# Patient Record
Sex: Female | Born: 1950
Health system: Southern US, Community
[De-identification: ages and names within clinical notes are randomized; demographics above are authoritative.]

## PROBLEM LIST (undated history)

## (undated) DIAGNOSIS — G901 Familial dysautonomia [Riley-Day]: Secondary | ICD-10-CM

## (undated) DIAGNOSIS — Q796 Ehlers-Danlos syndrome, unspecified: Secondary | ICD-10-CM

## (undated) DIAGNOSIS — E039 Hypothyroidism, unspecified: Secondary | ICD-10-CM

## (undated) DIAGNOSIS — M797 Fibromyalgia: Secondary | ICD-10-CM

## (undated) DIAGNOSIS — Z950 Presence of cardiac pacemaker: Secondary | ICD-10-CM

## (undated) DIAGNOSIS — I639 Cerebral infarction, unspecified: Secondary | ICD-10-CM

## (undated) DIAGNOSIS — R234 Changes in skin texture: Secondary | ICD-10-CM

## (undated) DIAGNOSIS — I471 Supraventricular tachycardia, unspecified: Secondary | ICD-10-CM

## (undated) DIAGNOSIS — Z8679 Personal history of other diseases of the circulatory system: Secondary | ICD-10-CM

## (undated) DIAGNOSIS — E7800 Pure hypercholesterolemia, unspecified: Secondary | ICD-10-CM

## (undated) DIAGNOSIS — I4719 Other supraventricular tachycardia: Secondary | ICD-10-CM

## (undated) DIAGNOSIS — R011 Cardiac murmur, unspecified: Secondary | ICD-10-CM

## (undated) DIAGNOSIS — I341 Nonrheumatic mitral (valve) prolapse: Secondary | ICD-10-CM

## (undated) DIAGNOSIS — R51 Headache: Secondary | ICD-10-CM

## (undated) DIAGNOSIS — F329 Major depressive disorder, single episode, unspecified: Secondary | ICD-10-CM

## (undated) DIAGNOSIS — C4491 Basal cell carcinoma of skin, unspecified: Secondary | ICD-10-CM

## (undated) DIAGNOSIS — I679 Cerebrovascular disease, unspecified: Secondary | ICD-10-CM

## (undated) DIAGNOSIS — F32A Depression, unspecified: Secondary | ICD-10-CM

## (undated) DIAGNOSIS — Z87898 Personal history of other specified conditions: Secondary | ICD-10-CM

## (undated) DIAGNOSIS — R269 Unspecified abnormalities of gait and mobility: Secondary | ICD-10-CM

## (undated) DIAGNOSIS — G43109 Migraine with aura, not intractable, without status migrainosus: Secondary | ICD-10-CM

## (undated) DIAGNOSIS — Z7901 Long term (current) use of anticoagulants: Secondary | ICD-10-CM

## (undated) DIAGNOSIS — I4891 Unspecified atrial fibrillation: Secondary | ICD-10-CM

## (undated) DIAGNOSIS — D689 Coagulation defect, unspecified: Secondary | ICD-10-CM

## (undated) DIAGNOSIS — Z8673 Personal history of transient ischemic attack (TIA), and cerebral infarction without residual deficits: Secondary | ICD-10-CM

## (undated) DIAGNOSIS — F419 Anxiety disorder, unspecified: Secondary | ICD-10-CM

## (undated) HISTORY — PX: PACEMAKER INSERTION: SHX728

## (undated) HISTORY — DX: Unspecified atrial fibrillation: I48.91

## (undated) HISTORY — DX: Coagulation defect, unspecified: D68.9

## (undated) HISTORY — DX: Cardiac murmur, unspecified: R01.1

## (undated) HISTORY — DX: Headache: R51

## (undated) HISTORY — DX: Anxiety disorder, unspecified: F41.9

## (undated) HISTORY — DX: Unspecified abnormalities of gait and mobility: R26.9

## (undated) HISTORY — DX: Long term (current) use of anticoagulants: Z79.01

## (undated) HISTORY — DX: Nonrheumatic mitral (valve) prolapse: I34.1

## (undated) HISTORY — DX: Pure hypercholesterolemia, unspecified: E78.00

## (undated) HISTORY — DX: Other supraventricular tachycardia: I47.19

## (undated) HISTORY — DX: Major depressive disorder, single episode, unspecified: F32.9

## (undated) HISTORY — DX: Depression, unspecified: F32.A

## (undated) HISTORY — PX: OTHER SURGICAL HISTORY: SHX169

## (undated) HISTORY — DX: Basal cell carcinoma of skin, unspecified: C44.91

## (undated) HISTORY — DX: Personal history of transient ischemic attack (TIA), and cerebral infarction without residual deficits: Z86.73

## (undated) HISTORY — DX: Personal history of other specified conditions: Z87.898

## (undated) HISTORY — DX: Migraine with aura, not intractable, without status migrainosus: G43.109

## (undated) HISTORY — DX: Supraventricular tachycardia: I47.1

## (undated) HISTORY — DX: Familial dysautonomia (riley-day): G90.1

## (undated) HISTORY — DX: Presence of cardiac pacemaker: Z95.0

## (undated) HISTORY — PX: ABLATION: SHX5711

## (undated) HISTORY — DX: Cerebral infarction, unspecified: I63.9

## (undated) HISTORY — DX: Fibromyalgia: M79.7

## (undated) HISTORY — PX: LEAD REVISION: SHX5945

## (undated) HISTORY — PX: INSERT / REPLACE / REMOVE PACEMAKER: SUR710

## (undated) HISTORY — DX: Ehlers-Danlos syndrome, unspecified: Q79.60

## (undated) HISTORY — PX: CAROTID ENDARTERECTOMY: SUR193

## (undated) HISTORY — DX: Changes in skin texture: R23.4

## (undated) HISTORY — DX: Personal history of other diseases of the circulatory system: Z86.79

## (undated) HISTORY — DX: Cerebrovascular disease, unspecified: I67.9

## (undated) HISTORY — DX: Hypothyroidism, unspecified: E03.9

## (undated) HISTORY — DX: Supraventricular tachycardia, unspecified: I47.10

---

## 1997-04-23 ENCOUNTER — Encounter (HOSPITAL_COMMUNITY): Admission: RE | Admit: 1997-04-23 | Discharge: 1997-07-22 | Payer: Self-pay | Admitting: Internal Medicine

## 2000-01-02 ENCOUNTER — Encounter (INDEPENDENT_AMBULATORY_CARE_PROVIDER_SITE_OTHER): Payer: Self-pay | Admitting: Specialist

## 2000-01-02 ENCOUNTER — Other Ambulatory Visit: Admission: RE | Admit: 2000-01-02 | Discharge: 2000-01-02 | Payer: Self-pay | Admitting: Obstetrics and Gynecology

## 2000-10-10 ENCOUNTER — Inpatient Hospital Stay (HOSPITAL_COMMUNITY): Admission: RE | Admit: 2000-10-10 | Discharge: 2000-10-12 | Payer: Self-pay | Admitting: Cardiovascular Disease

## 2000-12-04 ENCOUNTER — Encounter: Payer: Self-pay | Admitting: Internal Medicine

## 2000-12-04 ENCOUNTER — Ambulatory Visit (HOSPITAL_COMMUNITY): Admission: RE | Admit: 2000-12-04 | Discharge: 2000-12-04 | Payer: Self-pay | Admitting: Internal Medicine

## 2001-12-14 ENCOUNTER — Inpatient Hospital Stay (HOSPITAL_COMMUNITY): Admission: EM | Admit: 2001-12-14 | Discharge: 2001-12-19 | Payer: Self-pay | Admitting: Internal Medicine

## 2002-06-04 ENCOUNTER — Ambulatory Visit (HOSPITAL_COMMUNITY): Admission: RE | Admit: 2002-06-04 | Discharge: 2002-06-04 | Payer: Self-pay | Admitting: Internal Medicine

## 2002-07-02 ENCOUNTER — Emergency Department (HOSPITAL_COMMUNITY): Admission: EM | Admit: 2002-07-02 | Discharge: 2002-07-02 | Payer: Self-pay | Admitting: Emergency Medicine

## 2002-07-25 ENCOUNTER — Ambulatory Visit (HOSPITAL_COMMUNITY): Admission: RE | Admit: 2002-07-25 | Discharge: 2002-07-25 | Payer: Self-pay | Admitting: Internal Medicine

## 2003-04-16 ENCOUNTER — Observation Stay (HOSPITAL_COMMUNITY): Admission: EM | Admit: 2003-04-16 | Discharge: 2003-04-17 | Payer: Self-pay | Admitting: *Deleted

## 2003-05-20 ENCOUNTER — Ambulatory Visit (HOSPITAL_COMMUNITY): Admission: RE | Admit: 2003-05-20 | Discharge: 2003-05-20 | Payer: Self-pay | Admitting: Neurology

## 2003-11-06 ENCOUNTER — Inpatient Hospital Stay (HOSPITAL_COMMUNITY): Admission: RE | Admit: 2003-11-06 | Discharge: 2003-11-08 | Payer: Self-pay | Admitting: *Deleted

## 2003-11-06 ENCOUNTER — Encounter (INDEPENDENT_AMBULATORY_CARE_PROVIDER_SITE_OTHER): Payer: Self-pay | Admitting: Specialist

## 2003-11-24 HISTORY — PX: OTHER SURGICAL HISTORY: SHX169

## 2003-12-01 ENCOUNTER — Emergency Department (HOSPITAL_COMMUNITY): Admission: EM | Admit: 2003-12-01 | Discharge: 2003-12-01 | Payer: Self-pay | Admitting: Family Medicine

## 2003-12-02 ENCOUNTER — Ambulatory Visit: Payer: Self-pay | Admitting: *Deleted

## 2003-12-02 ENCOUNTER — Ambulatory Visit: Payer: Self-pay | Admitting: Cardiovascular Disease

## 2003-12-11 ENCOUNTER — Ambulatory Visit (HOSPITAL_COMMUNITY): Admission: RE | Admit: 2003-12-11 | Discharge: 2003-12-11 | Payer: Self-pay | Admitting: Neurology

## 2003-12-29 ENCOUNTER — Ambulatory Visit: Payer: Self-pay | Admitting: Cardiology

## 2003-12-30 ENCOUNTER — Ambulatory Visit (HOSPITAL_COMMUNITY): Admission: RE | Admit: 2003-12-30 | Discharge: 2003-12-30 | Payer: Self-pay | Admitting: Neurology

## 2004-01-08 ENCOUNTER — Ambulatory Visit: Payer: Self-pay | Admitting: Cardiovascular Disease

## 2004-02-01 ENCOUNTER — Ambulatory Visit: Payer: Self-pay | Admitting: Cardiology

## 2004-03-01 ENCOUNTER — Ambulatory Visit: Payer: Self-pay | Admitting: Cardiovascular Disease

## 2004-03-01 ENCOUNTER — Ambulatory Visit: Payer: Self-pay

## 2004-04-07 ENCOUNTER — Ambulatory Visit: Payer: Self-pay | Admitting: Cardiology

## 2004-04-18 ENCOUNTER — Ambulatory Visit: Payer: Self-pay | Admitting: Cardiovascular Disease

## 2004-04-18 ENCOUNTER — Ambulatory Visit: Payer: Self-pay

## 2004-04-28 ENCOUNTER — Ambulatory Visit: Payer: Self-pay

## 2004-05-05 ENCOUNTER — Ambulatory Visit: Payer: Self-pay | Admitting: Cardiology

## 2004-05-26 ENCOUNTER — Ambulatory Visit: Payer: Self-pay | Admitting: Internal Medicine

## 2004-06-09 ENCOUNTER — Ambulatory Visit: Payer: Self-pay | Admitting: Cardiology

## 2004-07-06 ENCOUNTER — Ambulatory Visit: Payer: Self-pay | Admitting: Cardiology

## 2004-07-18 ENCOUNTER — Ambulatory Visit: Payer: Self-pay

## 2004-07-18 ENCOUNTER — Ambulatory Visit: Payer: Self-pay | Admitting: Cardiovascular Disease

## 2004-08-03 ENCOUNTER — Ambulatory Visit: Payer: Self-pay | Admitting: Cardiology

## 2004-08-31 ENCOUNTER — Ambulatory Visit: Payer: Self-pay | Admitting: Cardiology

## 2004-09-14 ENCOUNTER — Ambulatory Visit: Payer: Self-pay | Admitting: Cardiology

## 2004-10-05 ENCOUNTER — Ambulatory Visit: Payer: Self-pay

## 2004-10-06 ENCOUNTER — Ambulatory Visit: Payer: Self-pay | Admitting: Internal Medicine

## 2004-10-07 ENCOUNTER — Ambulatory Visit: Payer: Self-pay

## 2004-10-07 ENCOUNTER — Ambulatory Visit: Payer: Self-pay | Admitting: Cardiovascular Disease

## 2004-10-11 ENCOUNTER — Ambulatory Visit: Payer: Self-pay | Admitting: Internal Medicine

## 2004-11-05 ENCOUNTER — Emergency Department (HOSPITAL_COMMUNITY): Admission: EM | Admit: 2004-11-05 | Discharge: 2004-11-05 | Payer: Self-pay | Admitting: Family Medicine

## 2004-11-08 ENCOUNTER — Ambulatory Visit: Payer: Self-pay | Admitting: Cardiology

## 2004-11-21 ENCOUNTER — Ambulatory Visit: Payer: Self-pay | Admitting: Pulmonary Disease

## 2004-11-23 ENCOUNTER — Ambulatory Visit: Payer: Self-pay | Admitting: *Deleted

## 2004-11-23 ENCOUNTER — Ambulatory Visit: Payer: Self-pay | Admitting: Pulmonary Disease

## 2004-11-29 ENCOUNTER — Ambulatory Visit: Payer: Self-pay | Admitting: Internal Medicine

## 2004-12-01 ENCOUNTER — Ambulatory Visit (HOSPITAL_COMMUNITY): Admission: RE | Admit: 2004-12-01 | Discharge: 2004-12-01 | Payer: Self-pay | Admitting: Neurology

## 2004-12-21 ENCOUNTER — Ambulatory Visit: Payer: Self-pay

## 2004-12-21 ENCOUNTER — Ambulatory Visit: Payer: Self-pay | Admitting: Cardiology

## 2005-01-04 ENCOUNTER — Ambulatory Visit: Payer: Self-pay | Admitting: Cardiology

## 2005-01-18 ENCOUNTER — Ambulatory Visit: Payer: Self-pay | Admitting: *Deleted

## 2005-02-08 ENCOUNTER — Ambulatory Visit: Payer: Self-pay | Admitting: Cardiology

## 2005-02-10 ENCOUNTER — Ambulatory Visit: Payer: Self-pay | Admitting: Internal Medicine

## 2005-02-22 ENCOUNTER — Ambulatory Visit: Payer: Self-pay | Admitting: Cardiology

## 2005-02-24 ENCOUNTER — Ambulatory Visit: Payer: Self-pay | Admitting: Internal Medicine

## 2005-03-07 ENCOUNTER — Ambulatory Visit: Payer: Self-pay | Admitting: Internal Medicine

## 2005-03-22 ENCOUNTER — Ambulatory Visit: Payer: Self-pay | Admitting: Internal Medicine

## 2005-03-28 ENCOUNTER — Ambulatory Visit: Payer: Self-pay | Admitting: Pulmonary Disease

## 2005-03-29 ENCOUNTER — Ambulatory Visit: Payer: Self-pay | Admitting: Pulmonary Disease

## 2005-04-19 ENCOUNTER — Ambulatory Visit: Payer: Self-pay | Admitting: *Deleted

## 2005-05-04 ENCOUNTER — Ambulatory Visit: Payer: Self-pay | Admitting: Cardiology

## 2005-05-17 ENCOUNTER — Ambulatory Visit: Payer: Self-pay | Admitting: *Deleted

## 2005-05-23 ENCOUNTER — Ambulatory Visit: Payer: Self-pay | Admitting: Internal Medicine

## 2005-05-23 ENCOUNTER — Ambulatory Visit: Payer: Self-pay | Admitting: Cardiology

## 2005-06-05 ENCOUNTER — Ambulatory Visit: Payer: Self-pay | Admitting: Cardiology

## 2005-06-05 ENCOUNTER — Ambulatory Visit: Payer: Self-pay

## 2005-06-05 ENCOUNTER — Ambulatory Visit: Payer: Self-pay | Admitting: Cardiovascular Disease

## 2005-07-05 ENCOUNTER — Ambulatory Visit: Payer: Self-pay | Admitting: Cardiovascular Disease

## 2005-08-02 ENCOUNTER — Ambulatory Visit: Payer: Self-pay | Admitting: Internal Medicine

## 2005-08-18 ENCOUNTER — Ambulatory Visit: Payer: Self-pay | Admitting: Pulmonary Disease

## 2005-09-05 ENCOUNTER — Ambulatory Visit: Payer: Self-pay

## 2005-09-05 ENCOUNTER — Ambulatory Visit: Payer: Self-pay | Admitting: Cardiology

## 2005-10-04 ENCOUNTER — Ambulatory Visit: Payer: Self-pay | Admitting: Cardiovascular Disease

## 2005-11-01 ENCOUNTER — Ambulatory Visit: Payer: Self-pay | Admitting: Cardiology

## 2005-11-22 ENCOUNTER — Ambulatory Visit: Payer: Self-pay | Admitting: Cardiology

## 2005-12-07 ENCOUNTER — Ambulatory Visit: Payer: Self-pay | Admitting: Cardiovascular Disease

## 2005-12-20 ENCOUNTER — Ambulatory Visit: Payer: Self-pay | Admitting: Cardiovascular Disease

## 2006-01-03 ENCOUNTER — Ambulatory Visit: Payer: Self-pay | Admitting: *Deleted

## 2006-01-26 ENCOUNTER — Ambulatory Visit: Payer: Self-pay | Admitting: Cardiology

## 2006-02-23 ENCOUNTER — Ambulatory Visit: Payer: Self-pay | Admitting: Internal Medicine

## 2006-03-09 ENCOUNTER — Ambulatory Visit: Payer: Self-pay | Admitting: Internal Medicine

## 2006-03-09 ENCOUNTER — Ambulatory Visit: Payer: Self-pay

## 2006-03-21 ENCOUNTER — Ambulatory Visit: Payer: Self-pay | Admitting: Internal Medicine

## 2006-04-11 ENCOUNTER — Ambulatory Visit: Payer: Self-pay | Admitting: Internal Medicine

## 2006-04-18 ENCOUNTER — Ambulatory Visit: Payer: Self-pay | Admitting: Cardiovascular Disease

## 2006-04-30 ENCOUNTER — Ambulatory Visit: Payer: Self-pay | Admitting: Pulmonary Disease

## 2006-05-02 ENCOUNTER — Ambulatory Visit: Payer: Self-pay | Admitting: Pulmonary Disease

## 2006-05-02 LAB — CONVERTED CEMR LAB
ALT: 28 units/L (ref 0–40)
AST: 31 units/L (ref 0–37)
Albumin: 3.8 g/dL (ref 3.5–5.2)
Alkaline Phosphatase: 74 units/L (ref 39–117)
BUN: 8 mg/dL (ref 6–23)
Basophils Absolute: 0 10*3/uL (ref 0.0–0.1)
Basophils Relative: 0.4 % (ref 0.0–1.0)
Bilirubin Urine: NEGATIVE
Bilirubin, Direct: 0.1 mg/dL (ref 0.0–0.3)
CO2: 31 meq/L (ref 19–32)
Calcium: 9.2 mg/dL (ref 8.4–10.5)
Chloride: 108 meq/L (ref 96–112)
Cholesterol: 112 mg/dL (ref 0–200)
Creatinine, Ser: 0.8 mg/dL (ref 0.4–1.2)
Eosinophils Absolute: 0.1 10*3/uL (ref 0.0–0.6)
Eosinophils Relative: 1.6 % (ref 0.0–5.0)
GFR calc Af Amer: 96 mL/min
GFR calc non Af Amer: 80 mL/min
Glucose, Bld: 89 mg/dL (ref 70–99)
HCT: 40.7 % (ref 36.0–46.0)
HDL: 56.7 mg/dL (ref >39.0)
Hemoglobin: 14.1 g/dL (ref 12.0–15.0)
Ketones, ur: NEGATIVE mg/dL
LDL Cholesterol: 42 mg/dL (ref 0–99)
Leukocytes, UA: NEGATIVE
Lymphocytes Relative: 20.4 % (ref 12.0–46.0)
MCHC: 34.5 g/dL (ref 30.0–36.0)
MCV: 90.6 fL (ref 78.0–100.0)
Monocytes Absolute: 0.5 10*3/uL (ref 0.2–0.7)
Monocytes Relative: 8.3 % (ref 3.0–11.0)
Neutro Abs: 3.9 10*3/uL (ref 1.4–7.7)
Neutrophils Relative %: 69.3 % (ref 43.0–77.0)
Nitrite: NEGATIVE
Platelets: 151 10*3/uL (ref 150–400)
Potassium: 4.2 meq/L (ref 3.5–5.1)
RBC: 4.5 M/uL (ref 3.87–5.11)
RDW: 12.7 % (ref 11.5–14.6)
Sodium: 142 meq/L (ref 135–145)
Specific Gravity, Urine: 1.025 (ref 1.000–1.03)
TSH: 1.38 microintl units/mL (ref 0.35–5.50)
Total Bilirubin: 0.7 mg/dL (ref 0.3–1.2)
Total CHOL/HDL Ratio: 2
Total Protein, Urine: NEGATIVE mg/dL
Total Protein: 6.9 g/dL (ref 6.0–8.3)
Triglycerides: 68 mg/dL (ref 0–149)
Urine Glucose: NEGATIVE mg/dL
Urobilinogen, UA: 0.2 (ref 0.0–1.0)
VLDL: 14 mg/dL (ref 0–40)
WBC: 5.7 10*3/uL (ref 4.5–10.5)
pH: 6 (ref 5.0–8.0)

## 2006-05-10 ENCOUNTER — Ambulatory Visit: Payer: Self-pay | Admitting: Internal Medicine

## 2006-05-10 ENCOUNTER — Ambulatory Visit: Payer: Self-pay | Admitting: Pulmonary Disease

## 2006-05-10 LAB — CONVERTED CEMR LAB
ALT: 27 units/L (ref 0–40)
AST: 29 units/L (ref 0–37)
Albumin: 3.8 g/dL (ref 3.5–5.2)
Alkaline Phosphatase: 71 units/L (ref 39–117)
BUN: 12 mg/dL (ref 6–23)
Basophils Absolute: 0.1 10*3/uL (ref 0.0–0.1)
Basophils Relative: 0.9 % (ref 0.0–1.0)
Bilirubin, Direct: 0.1 mg/dL (ref 0.0–0.3)
CO2: 34 meq/L — ABNORMAL HIGH (ref 19–32)
Calcium: 10.3 mg/dL (ref 8.4–10.5)
Chloride: 109 meq/L (ref 96–112)
Creatinine, Ser: 0.8 mg/dL (ref 0.4–1.2)
Eosinophils Absolute: 0.1 10*3/uL (ref 0.0–0.6)
Eosinophils Relative: 1.1 % (ref 0.0–5.0)
GFR calc Af Amer: 96 mL/min
GFR calc non Af Amer: 80 mL/min
Glucose, Bld: 84 mg/dL (ref 70–99)
HCT: 40.5 % (ref 36.0–46.0)
Hemoglobin: 14 g/dL (ref 12.0–15.0)
Lymphocytes Relative: 17.6 % (ref 12.0–46.0)
MCHC: 34.5 g/dL (ref 30.0–36.0)
MCV: 90.5 fL (ref 78.0–100.0)
Monocytes Absolute: 0.5 10*3/uL (ref 0.2–0.7)
Monocytes Relative: 5.6 % (ref 3.0–11.0)
Neutro Abs: 6 10*3/uL (ref 1.4–7.7)
Neutrophils Relative %: 74.8 % (ref 43.0–77.0)
Platelets: 173 10*3/uL (ref 150–400)
Potassium: 4.5 meq/L (ref 3.5–5.1)
RBC: 4.48 M/uL (ref 3.87–5.11)
RDW: 12.7 % (ref 11.5–14.6)
Sodium: 147 meq/L — ABNORMAL HIGH (ref 135–145)
Total Bilirubin: 0.7 mg/dL (ref 0.3–1.2)
Total Protein: 7.2 g/dL (ref 6.0–8.3)
WBC: 8.1 10*3/uL (ref 4.5–10.5)

## 2006-06-04 ENCOUNTER — Ambulatory Visit: Payer: Self-pay | Admitting: Internal Medicine

## 2006-06-06 ENCOUNTER — Ambulatory Visit: Payer: Self-pay | Admitting: Internal Medicine

## 2006-06-07 ENCOUNTER — Ambulatory Visit: Payer: Self-pay | Admitting: Cardiovascular Disease

## 2006-06-21 ENCOUNTER — Ambulatory Visit: Payer: Self-pay | Admitting: *Deleted

## 2006-06-21 ENCOUNTER — Inpatient Hospital Stay (HOSPITAL_COMMUNITY): Admission: AD | Admit: 2006-06-21 | Discharge: 2006-06-22 | Payer: Self-pay | Admitting: *Deleted

## 2006-06-22 ENCOUNTER — Ambulatory Visit: Payer: Self-pay | Admitting: *Deleted

## 2006-06-26 ENCOUNTER — Ambulatory Visit: Payer: Self-pay | Admitting: Cardiology

## 2006-07-02 ENCOUNTER — Ambulatory Visit: Payer: Self-pay | Admitting: Internal Medicine

## 2006-07-09 ENCOUNTER — Ambulatory Visit: Payer: Self-pay | Admitting: Internal Medicine

## 2006-07-09 ENCOUNTER — Ambulatory Visit: Payer: Self-pay | Admitting: Cardiology

## 2006-07-30 ENCOUNTER — Ambulatory Visit: Payer: Self-pay | Admitting: Cardiovascular Disease

## 2006-08-27 ENCOUNTER — Ambulatory Visit: Payer: Self-pay

## 2006-08-27 ENCOUNTER — Ambulatory Visit: Payer: Self-pay | Admitting: Internal Medicine

## 2006-09-25 ENCOUNTER — Ambulatory Visit: Payer: Self-pay | Admitting: Cardiology

## 2006-10-04 ENCOUNTER — Ambulatory Visit: Payer: Self-pay | Admitting: *Deleted

## 2006-10-09 ENCOUNTER — Ambulatory Visit: Payer: Self-pay | Admitting: Cardiology

## 2006-10-10 ENCOUNTER — Ambulatory Visit: Payer: Self-pay | Admitting: Internal Medicine

## 2006-10-23 ENCOUNTER — Ambulatory Visit: Payer: Self-pay | Admitting: Cardiology

## 2006-10-29 ENCOUNTER — Ambulatory Visit: Payer: Self-pay | Admitting: Professional

## 2006-11-05 ENCOUNTER — Ambulatory Visit: Payer: Self-pay | Admitting: Professional

## 2006-11-06 ENCOUNTER — Ambulatory Visit: Payer: Self-pay | Admitting: Cardiology

## 2006-11-19 ENCOUNTER — Ambulatory Visit: Payer: Self-pay | Admitting: Professional

## 2006-11-26 ENCOUNTER — Ambulatory Visit: Payer: Self-pay | Admitting: Professional

## 2006-11-26 ENCOUNTER — Ambulatory Visit: Payer: Self-pay | Admitting: Cardiovascular Disease

## 2006-12-03 ENCOUNTER — Ambulatory Visit: Payer: Self-pay | Admitting: Cardiovascular Disease

## 2006-12-06 ENCOUNTER — Ambulatory Visit: Payer: Self-pay | Admitting: Professional

## 2006-12-10 ENCOUNTER — Ambulatory Visit: Payer: Self-pay | Admitting: Professional

## 2006-12-11 ENCOUNTER — Ambulatory Visit: Payer: Self-pay

## 2006-12-11 ENCOUNTER — Encounter: Payer: Self-pay | Admitting: Cardiovascular Disease

## 2006-12-17 ENCOUNTER — Ambulatory Visit: Payer: Self-pay | Admitting: Professional

## 2006-12-24 ENCOUNTER — Ambulatory Visit: Payer: Self-pay | Admitting: Internal Medicine

## 2006-12-24 ENCOUNTER — Ambulatory Visit: Payer: Self-pay | Admitting: Professional

## 2006-12-31 ENCOUNTER — Ambulatory Visit: Payer: Self-pay | Admitting: Professional

## 2007-01-07 ENCOUNTER — Ambulatory Visit: Payer: Self-pay | Admitting: Professional

## 2007-01-09 ENCOUNTER — Ambulatory Visit: Payer: Self-pay | Admitting: Internal Medicine

## 2007-01-14 ENCOUNTER — Ambulatory Visit: Payer: Self-pay | Admitting: Professional

## 2007-01-21 ENCOUNTER — Ambulatory Visit: Payer: Self-pay | Admitting: Cardiology

## 2007-01-25 ENCOUNTER — Telehealth (INDEPENDENT_AMBULATORY_CARE_PROVIDER_SITE_OTHER): Payer: Self-pay | Admitting: *Deleted

## 2007-01-28 ENCOUNTER — Ambulatory Visit: Payer: Self-pay | Admitting: Pulmonary Disease

## 2007-01-28 DIAGNOSIS — J309 Allergic rhinitis, unspecified: Secondary | ICD-10-CM | POA: Insufficient documentation

## 2007-01-28 DIAGNOSIS — I059 Rheumatic mitral valve disease, unspecified: Secondary | ICD-10-CM | POA: Insufficient documentation

## 2007-01-28 DIAGNOSIS — F329 Major depressive disorder, single episode, unspecified: Secondary | ICD-10-CM

## 2007-01-28 DIAGNOSIS — E039 Hypothyroidism, unspecified: Secondary | ICD-10-CM | POA: Insufficient documentation

## 2007-01-28 DIAGNOSIS — G901 Familial dysautonomia [Riley-Day]: Secondary | ICD-10-CM | POA: Insufficient documentation

## 2007-01-28 DIAGNOSIS — F32A Depression, unspecified: Secondary | ICD-10-CM | POA: Insufficient documentation

## 2007-01-28 DIAGNOSIS — IMO0001 Reserved for inherently not codable concepts without codable children: Secondary | ICD-10-CM | POA: Insufficient documentation

## 2007-02-06 ENCOUNTER — Ambulatory Visit: Payer: Self-pay | Admitting: Cardiovascular Disease

## 2007-02-11 ENCOUNTER — Telehealth: Payer: Self-pay | Admitting: Pulmonary Disease

## 2007-02-11 ENCOUNTER — Ambulatory Visit: Payer: Self-pay | Admitting: Professional

## 2007-02-14 ENCOUNTER — Ambulatory Visit: Payer: Self-pay | Admitting: Pulmonary Disease

## 2007-02-18 ENCOUNTER — Ambulatory Visit: Payer: Self-pay | Admitting: Gastroenterology

## 2007-02-18 ENCOUNTER — Ambulatory Visit: Payer: Self-pay | Admitting: Professional

## 2007-02-25 ENCOUNTER — Ambulatory Visit: Payer: Self-pay | Admitting: Professional

## 2007-03-04 ENCOUNTER — Ambulatory Visit: Payer: Self-pay | Admitting: Professional

## 2007-03-05 ENCOUNTER — Ambulatory Visit: Payer: Self-pay

## 2007-03-05 ENCOUNTER — Ambulatory Visit: Payer: Self-pay | Admitting: Internal Medicine

## 2007-03-11 ENCOUNTER — Ambulatory Visit: Payer: Self-pay | Admitting: Cardiovascular Disease

## 2007-03-12 ENCOUNTER — Ambulatory Visit: Payer: Self-pay | Admitting: Professional

## 2007-03-14 ENCOUNTER — Ambulatory Visit (HOSPITAL_COMMUNITY): Admission: RE | Admit: 2007-03-14 | Discharge: 2007-03-14 | Payer: Self-pay | Admitting: Gastroenterology

## 2007-03-14 ENCOUNTER — Encounter: Payer: Self-pay | Admitting: Pulmonary Disease

## 2007-03-14 ENCOUNTER — Ambulatory Visit: Payer: Self-pay | Admitting: Gastroenterology

## 2007-03-18 ENCOUNTER — Ambulatory Visit: Payer: Self-pay | Admitting: Professional

## 2007-03-18 ENCOUNTER — Ambulatory Visit: Payer: Self-pay | Admitting: Cardiology

## 2007-03-20 ENCOUNTER — Ambulatory Visit: Payer: Self-pay | Admitting: Gastroenterology

## 2007-03-21 ENCOUNTER — Ambulatory Visit: Payer: Self-pay | Admitting: Cardiology

## 2007-03-26 ENCOUNTER — Ambulatory Visit: Payer: Self-pay | Admitting: Internal Medicine

## 2007-04-01 ENCOUNTER — Ambulatory Visit: Payer: Self-pay | Admitting: Professional

## 2007-04-16 ENCOUNTER — Encounter: Payer: Self-pay | Admitting: Pulmonary Disease

## 2007-04-16 ENCOUNTER — Ambulatory Visit: Payer: Self-pay | Admitting: Cardiovascular Disease

## 2007-04-22 ENCOUNTER — Ambulatory Visit: Payer: Self-pay | Admitting: Cardiology

## 2007-04-22 ENCOUNTER — Ambulatory Visit: Payer: Self-pay | Admitting: Professional

## 2007-04-22 ENCOUNTER — Telehealth (INDEPENDENT_AMBULATORY_CARE_PROVIDER_SITE_OTHER): Payer: Self-pay | Admitting: *Deleted

## 2007-04-24 ENCOUNTER — Ambulatory Visit: Payer: Self-pay | Admitting: Internal Medicine

## 2007-04-24 ENCOUNTER — Ambulatory Visit: Payer: Self-pay

## 2007-04-29 ENCOUNTER — Ambulatory Visit: Payer: Self-pay | Admitting: Professional

## 2007-05-07 ENCOUNTER — Ambulatory Visit: Payer: Self-pay | Admitting: Cardiology

## 2007-05-13 ENCOUNTER — Ambulatory Visit: Payer: Self-pay | Admitting: Pulmonary Disease

## 2007-05-13 LAB — CONVERTED CEMR LAB
ALT: 29 units/L (ref 0–35)
AST: 29 units/L (ref 0–37)
Albumin: 3.6 g/dL (ref 3.5–5.2)
Alkaline Phosphatase: 63 units/L (ref 39–117)
BUN: 12 mg/dL (ref 6–23)
Basophils Absolute: 0 10*3/uL (ref 0.0–0.1)
Basophils Relative: 0.1 % (ref 0.0–1.0)
Bilirubin, Direct: 0.1 mg/dL (ref 0.0–0.3)
CO2: 30 meq/L (ref 19–32)
Calcium: 9.2 mg/dL (ref 8.4–10.5)
Chloride: 109 meq/L (ref 96–112)
Cholesterol: 119 mg/dL (ref 0–200)
Creatinine, Ser: 0.8 mg/dL (ref 0.4–1.2)
Eosinophils Absolute: 0.1 10*3/uL (ref 0.0–0.7)
Eosinophils Relative: 2.8 % (ref 0.0–5.0)
GFR calc Af Amer: 96 mL/min
GFR calc non Af Amer: 79 mL/min
Glucose, Bld: 97 mg/dL (ref 70–99)
HCT: 39 % (ref 36.0–46.0)
HDL: 55.1 mg/dL (ref >39.0)
Hemoglobin: 13 g/dL (ref 12.0–15.0)
LDL Cholesterol: 54 mg/dL (ref 0–99)
Lymphocytes Relative: 31.4 % (ref 12.0–46.0)
MCHC: 33.4 g/dL (ref 30.0–36.0)
MCV: 91.6 fL (ref 78.0–100.0)
Monocytes Absolute: 0.4 10*3/uL (ref 0.1–1.0)
Monocytes Relative: 12.4 % — ABNORMAL HIGH (ref 3.0–12.0)
Neutro Abs: 1.9 10*3/uL (ref 1.4–7.7)
Neutrophils Relative %: 53.3 % (ref 43.0–77.0)
Platelets: 130 10*3/uL — ABNORMAL LOW (ref 150–400)
Potassium: 4.2 meq/L (ref 3.5–5.1)
RBC: 4.26 M/uL (ref 3.87–5.11)
RDW: 12.6 % (ref 11.5–14.6)
Sodium: 141 meq/L (ref 135–145)
TSH: 0.1 microintl units/mL — ABNORMAL LOW (ref 0.35–5.50)
Total Bilirubin: 0.6 mg/dL (ref 0.3–1.2)
Total CHOL/HDL Ratio: 2.2
Total Protein: 6.8 g/dL (ref 6.0–8.3)
Triglycerides: 50 mg/dL (ref 0–149)
VLDL: 10 mg/dL (ref 0–40)
Vit D, 1,25-Dihydroxy: 45 (ref 30–89)
WBC: 3.5 10*3/uL — ABNORMAL LOW (ref 4.5–10.5)

## 2007-05-15 ENCOUNTER — Ambulatory Visit: Payer: Self-pay | Admitting: Pulmonary Disease

## 2007-05-15 DIAGNOSIS — E7800 Pure hypercholesterolemia, unspecified: Secondary | ICD-10-CM | POA: Insufficient documentation

## 2007-05-15 DIAGNOSIS — I679 Cerebrovascular disease, unspecified: Secondary | ICD-10-CM | POA: Insufficient documentation

## 2007-05-16 DIAGNOSIS — R55 Syncope and collapse: Secondary | ICD-10-CM | POA: Insufficient documentation

## 2007-05-16 DIAGNOSIS — I495 Sick sinus syndrome: Secondary | ICD-10-CM | POA: Insufficient documentation

## 2007-05-16 DIAGNOSIS — G459 Transient cerebral ischemic attack, unspecified: Secondary | ICD-10-CM | POA: Insufficient documentation

## 2007-05-16 DIAGNOSIS — K649 Unspecified hemorrhoids: Secondary | ICD-10-CM | POA: Insufficient documentation

## 2007-05-16 DIAGNOSIS — Q796 Ehlers-Danlos syndrome, unspecified: Secondary | ICD-10-CM | POA: Insufficient documentation

## 2007-05-16 DIAGNOSIS — C4490 Unspecified malignant neoplasm of skin, unspecified: Secondary | ICD-10-CM | POA: Insufficient documentation

## 2007-05-27 ENCOUNTER — Ambulatory Visit: Payer: Self-pay | Admitting: Professional

## 2007-05-28 ENCOUNTER — Ambulatory Visit: Payer: Self-pay | Admitting: Cardiology

## 2007-05-31 ENCOUNTER — Ambulatory Visit: Payer: Self-pay | Admitting: Cardiovascular Disease

## 2007-06-05 ENCOUNTER — Ambulatory Visit: Payer: Self-pay | Admitting: Internal Medicine

## 2007-06-10 ENCOUNTER — Ambulatory Visit: Payer: Self-pay | Admitting: Professional

## 2007-06-13 ENCOUNTER — Emergency Department (HOSPITAL_COMMUNITY): Admission: EM | Admit: 2007-06-13 | Discharge: 2007-06-13 | Payer: Self-pay | Admitting: Emergency Medicine

## 2007-06-25 ENCOUNTER — Ambulatory Visit: Payer: Self-pay | Admitting: Cardiology

## 2007-06-26 ENCOUNTER — Ambulatory Visit: Payer: Self-pay | Admitting: Internal Medicine

## 2007-07-23 ENCOUNTER — Ambulatory Visit: Payer: Self-pay | Admitting: Cardiology

## 2007-08-20 ENCOUNTER — Ambulatory Visit: Payer: Self-pay | Admitting: Internal Medicine

## 2007-09-10 ENCOUNTER — Ambulatory Visit: Payer: Self-pay | Admitting: Internal Medicine

## 2007-09-10 ENCOUNTER — Ambulatory Visit: Payer: Self-pay

## 2007-09-17 ENCOUNTER — Ambulatory Visit: Payer: Self-pay | Admitting: Cardiology

## 2007-09-27 ENCOUNTER — Ambulatory Visit: Payer: Self-pay | Admitting: Cardiovascular Disease

## 2007-10-10 ENCOUNTER — Telehealth (INDEPENDENT_AMBULATORY_CARE_PROVIDER_SITE_OTHER): Payer: Self-pay | Admitting: *Deleted

## 2007-10-11 ENCOUNTER — Ambulatory Visit: Payer: Self-pay | Admitting: Cardiology

## 2007-10-11 ENCOUNTER — Ambulatory Visit: Payer: Self-pay | Admitting: Pulmonary Disease

## 2007-10-16 ENCOUNTER — Ambulatory Visit: Payer: Self-pay | Admitting: Pulmonary Disease

## 2007-10-21 ENCOUNTER — Ambulatory Visit (HOSPITAL_COMMUNITY): Admission: RE | Admit: 2007-10-21 | Discharge: 2007-10-21 | Payer: Self-pay | Admitting: Neurology

## 2007-10-25 ENCOUNTER — Ambulatory Visit: Payer: Self-pay | Admitting: Cardiovascular Disease

## 2007-11-08 ENCOUNTER — Telehealth: Payer: Self-pay | Admitting: Pulmonary Disease

## 2007-11-08 LAB — CONVERTED CEMR LAB: TSH: 1.67 microintl units/mL (ref 0.35–5.50)

## 2007-11-15 ENCOUNTER — Ambulatory Visit: Payer: Self-pay | Admitting: Cardiology

## 2007-11-26 ENCOUNTER — Telehealth (INDEPENDENT_AMBULATORY_CARE_PROVIDER_SITE_OTHER): Payer: Self-pay | Admitting: *Deleted

## 2007-12-04 ENCOUNTER — Ambulatory Visit: Payer: Self-pay | Admitting: Cardiovascular Disease

## 2007-12-13 ENCOUNTER — Ambulatory Visit: Payer: Self-pay | Admitting: Cardiovascular Disease

## 2007-12-13 ENCOUNTER — Ambulatory Visit: Payer: Self-pay | Admitting: Internal Medicine

## 2007-12-30 ENCOUNTER — Ambulatory Visit: Payer: Self-pay | Admitting: Cardiovascular Disease

## 2007-12-30 ENCOUNTER — Ambulatory Visit: Payer: Self-pay

## 2007-12-30 LAB — CONVERTED CEMR LAB
BUN: 13 mg/dL (ref 6–23)
CO2: 30 meq/L (ref 19–32)
Calcium: 9.6 mg/dL (ref 8.4–10.5)
Chloride: 104 meq/L (ref 96–112)
Creatinine, Ser: 0.8 mg/dL (ref 0.4–1.2)
GFR calc Af Amer: 96 mL/min
GFR calc non Af Amer: 79 mL/min
Glucose, Bld: 81 mg/dL (ref 70–99)
Potassium: 4.3 meq/L (ref 3.5–5.1)
Sodium: 140 meq/L (ref 135–145)

## 2008-01-03 ENCOUNTER — Ambulatory Visit: Payer: Self-pay | Admitting: Cardiovascular Disease

## 2008-01-29 ENCOUNTER — Ambulatory Visit: Payer: Self-pay | Admitting: Cardiology

## 2008-02-12 ENCOUNTER — Ambulatory Visit: Payer: Self-pay | Admitting: Internal Medicine

## 2008-02-24 ENCOUNTER — Encounter (INDEPENDENT_AMBULATORY_CARE_PROVIDER_SITE_OTHER): Payer: Self-pay | Admitting: *Deleted

## 2008-03-03 ENCOUNTER — Ambulatory Visit: Payer: Self-pay | Admitting: Cardiology

## 2008-03-11 ENCOUNTER — Ambulatory Visit: Payer: Self-pay

## 2008-03-24 ENCOUNTER — Ambulatory Visit: Payer: Self-pay | Admitting: Internal Medicine

## 2008-05-01 ENCOUNTER — Ambulatory Visit: Payer: Self-pay | Admitting: Cardiology

## 2008-05-13 ENCOUNTER — Ambulatory Visit: Payer: Self-pay | Admitting: Internal Medicine

## 2008-05-27 ENCOUNTER — Ambulatory Visit: Payer: Self-pay | Admitting: Cardiovascular Disease

## 2008-05-29 ENCOUNTER — Ambulatory Visit: Payer: Self-pay | Admitting: Pulmonary Disease

## 2008-06-04 ENCOUNTER — Ambulatory Visit: Payer: Self-pay | Admitting: Pulmonary Disease

## 2008-06-07 LAB — CONVERTED CEMR LAB
ALT: 16 units/L (ref 0–35)
AST: 23 units/L (ref 0–37)
Albumin: 3.8 g/dL (ref 3.5–5.2)
Alkaline Phosphatase: 64 units/L (ref 39–117)
BUN: 11 mg/dL (ref 6–23)
Basophils Absolute: 0 10*3/uL (ref 0.0–0.1)
Basophils Relative: 0.6 % (ref 0.0–3.0)
Bilirubin, Direct: 0.1 mg/dL (ref 0.0–0.3)
CO2: 30 meq/L (ref 19–32)
Calcium: 9.3 mg/dL (ref 8.4–10.5)
Chloride: 106 meq/L (ref 96–112)
Cholesterol: 120 mg/dL (ref 0–200)
Creatinine, Ser: 0.8 mg/dL (ref 0.4–1.2)
Eosinophils Absolute: 0.1 10*3/uL (ref 0.0–0.7)
Eosinophils Relative: 2.7 % (ref 0.0–5.0)
GFR calc non Af Amer: 78.92 mL/min (ref >60)
Glucose, Bld: 95 mg/dL (ref 70–99)
HCT: 39.1 % (ref 36.0–46.0)
HDL: 57.8 mg/dL (ref >39.00)
Hemoglobin: 13.5 g/dL (ref 12.0–15.0)
LDL Cholesterol: 54 mg/dL (ref 0–99)
Lymphocytes Relative: 27.6 % (ref 12.0–46.0)
Lymphs Abs: 1.1 10*3/uL (ref 0.7–4.0)
MCHC: 34.4 g/dL (ref 30.0–36.0)
MCV: 92.3 fL (ref 78.0–100.0)
Monocytes Absolute: 0.5 10*3/uL (ref 0.1–1.0)
Monocytes Relative: 11.6 % (ref 3.0–12.0)
Neutro Abs: 2.3 10*3/uL (ref 1.4–7.7)
Neutrophils Relative %: 57.5 % (ref 43.0–77.0)
Platelets: 128 10*3/uL — ABNORMAL LOW (ref 150.0–400.0)
Potassium: 4.6 meq/L (ref 3.5–5.1)
RBC: 4.24 M/uL (ref 3.87–5.11)
RDW: 12.9 % (ref 11.5–14.6)
Sodium: 141 meq/L (ref 135–145)
TSH: 2.92 microintl units/mL (ref 0.35–5.50)
Total Bilirubin: 0.6 mg/dL (ref 0.3–1.2)
Total CHOL/HDL Ratio: 2
Total Protein: 7 g/dL (ref 6.0–8.3)
Triglycerides: 43 mg/dL (ref 0.0–149.0)
VLDL: 8.6 mg/dL (ref 0.0–40.0)
WBC: 4 10*3/uL — ABNORMAL LOW (ref 4.5–10.5)

## 2008-06-11 DIAGNOSIS — I4891 Unspecified atrial fibrillation: Secondary | ICD-10-CM | POA: Insufficient documentation

## 2008-06-23 ENCOUNTER — Encounter: Payer: Self-pay | Admitting: *Deleted

## 2008-06-24 ENCOUNTER — Ambulatory Visit: Payer: Self-pay | Admitting: Cardiology

## 2008-06-24 ENCOUNTER — Ambulatory Visit: Payer: Self-pay | Admitting: Cardiovascular Disease

## 2008-06-24 LAB — CONVERTED CEMR LAB
POC INR: 2.3
Protime: 18.5

## 2008-07-10 ENCOUNTER — Emergency Department (HOSPITAL_COMMUNITY): Admission: EM | Admit: 2008-07-10 | Discharge: 2008-07-10 | Payer: Self-pay | Admitting: Emergency Medicine

## 2008-07-15 ENCOUNTER — Ambulatory Visit: Payer: Self-pay | Admitting: Internal Medicine

## 2008-07-15 LAB — CONVERTED CEMR LAB
POC INR: 2.3
Prothrombin Time: 18.7 s

## 2008-07-28 ENCOUNTER — Ambulatory Visit: Payer: Self-pay | Admitting: Internal Medicine

## 2008-07-28 DIAGNOSIS — I471 Supraventricular tachycardia: Secondary | ICD-10-CM | POA: Insufficient documentation

## 2008-07-29 ENCOUNTER — Encounter: Payer: Self-pay | Admitting: *Deleted

## 2008-08-05 ENCOUNTER — Ambulatory Visit: Payer: Self-pay | Admitting: Internal Medicine

## 2008-08-05 LAB — CONVERTED CEMR LAB
POC INR: 3
Prothrombin Time: 20.9 s

## 2008-09-02 ENCOUNTER — Ambulatory Visit: Payer: Self-pay | Admitting: Internal Medicine

## 2008-09-02 LAB — CONVERTED CEMR LAB: POC INR: 3.4

## 2008-09-23 ENCOUNTER — Encounter: Payer: Self-pay | Admitting: Pulmonary Disease

## 2008-09-23 ENCOUNTER — Ambulatory Visit: Payer: Self-pay | Admitting: Family Medicine

## 2008-10-01 ENCOUNTER — Ambulatory Visit: Payer: Self-pay | Admitting: Cardiovascular Disease

## 2008-10-01 LAB — CONVERTED CEMR LAB: POC INR: 3.3

## 2008-10-26 ENCOUNTER — Telehealth (INDEPENDENT_AMBULATORY_CARE_PROVIDER_SITE_OTHER): Payer: Self-pay | Admitting: *Deleted

## 2008-10-28 ENCOUNTER — Ambulatory Visit: Payer: Self-pay | Admitting: Internal Medicine

## 2008-10-28 LAB — CONVERTED CEMR LAB: POC INR: 3.2

## 2008-11-03 ENCOUNTER — Telehealth (INDEPENDENT_AMBULATORY_CARE_PROVIDER_SITE_OTHER): Payer: Self-pay | Admitting: *Deleted

## 2008-11-11 ENCOUNTER — Encounter (INDEPENDENT_AMBULATORY_CARE_PROVIDER_SITE_OTHER): Payer: Self-pay | Admitting: *Deleted

## 2008-11-25 ENCOUNTER — Ambulatory Visit: Payer: Self-pay | Admitting: Cardiology

## 2008-11-25 ENCOUNTER — Telehealth (INDEPENDENT_AMBULATORY_CARE_PROVIDER_SITE_OTHER): Payer: Self-pay | Admitting: *Deleted

## 2008-11-25 LAB — CONVERTED CEMR LAB: POC INR: 3.7

## 2008-12-15 ENCOUNTER — Ambulatory Visit: Payer: Self-pay | Admitting: Pulmonary Disease

## 2009-01-20 ENCOUNTER — Ambulatory Visit: Payer: Self-pay | Admitting: Cardiovascular Disease

## 2009-01-20 ENCOUNTER — Ambulatory Visit: Payer: Self-pay | Admitting: Cardiology

## 2009-01-20 LAB — CONVERTED CEMR LAB: POC INR: 2.3

## 2009-01-27 ENCOUNTER — Ambulatory Visit: Payer: Self-pay

## 2009-01-27 ENCOUNTER — Encounter: Payer: Self-pay | Admitting: Internal Medicine

## 2009-02-17 ENCOUNTER — Ambulatory Visit: Payer: Self-pay | Admitting: Cardiology

## 2009-02-17 LAB — CONVERTED CEMR LAB: POC INR: 2

## 2009-03-03 ENCOUNTER — Ambulatory Visit: Payer: Self-pay | Admitting: Cardiology

## 2009-03-03 LAB — CONVERTED CEMR LAB: POC INR: 3.1

## 2009-03-16 ENCOUNTER — Encounter: Payer: Self-pay | Admitting: Internal Medicine

## 2009-03-16 ENCOUNTER — Ambulatory Visit: Payer: Self-pay

## 2009-03-24 ENCOUNTER — Ambulatory Visit: Payer: Self-pay | Admitting: Cardiology

## 2009-03-24 ENCOUNTER — Encounter: Payer: Self-pay | Admitting: Internal Medicine

## 2009-03-24 ENCOUNTER — Ambulatory Visit: Payer: Self-pay

## 2009-03-24 LAB — CONVERTED CEMR LAB: POC INR: 2.2

## 2009-03-30 ENCOUNTER — Ambulatory Visit: Payer: Self-pay | Admitting: Internal Medicine

## 2009-03-30 ENCOUNTER — Encounter: Payer: Self-pay | Admitting: Internal Medicine

## 2009-03-31 ENCOUNTER — Telehealth (INDEPENDENT_AMBULATORY_CARE_PROVIDER_SITE_OTHER): Payer: Self-pay | Admitting: *Deleted

## 2009-03-31 LAB — CONVERTED CEMR LAB
BUN: 14 mg/dL (ref 6–23)
Basophils Absolute: 0 10*3/uL (ref 0.0–0.1)
Basophils Relative: 0.4 % (ref 0.0–3.0)
CO2: 31 meq/L (ref 19–32)
Calcium: 9.2 mg/dL (ref 8.4–10.5)
Chloride: 108 meq/L (ref 96–112)
Creatinine, Ser: 0.8 mg/dL (ref 0.4–1.2)
Eosinophils Absolute: 0.1 10*3/uL (ref 0.0–0.7)
Eosinophils Relative: 2.2 % (ref 0.0–5.0)
GFR calc non Af Amer: 78.69 mL/min (ref >60)
Glucose, Bld: 67 mg/dL — ABNORMAL LOW (ref 70–99)
HCT: 37.3 % (ref 36.0–46.0)
Hemoglobin: 12.7 g/dL (ref 12.0–15.0)
INR: 2.2 — ABNORMAL HIGH (ref 0.8–1.0)
Lymphocytes Relative: 22.9 % (ref 12.0–46.0)
Lymphs Abs: 0.9 10*3/uL (ref 0.7–4.0)
MCHC: 34 g/dL (ref 30.0–36.0)
MCV: 93.9 fL (ref 78.0–100.0)
Monocytes Absolute: 0.5 10*3/uL (ref 0.1–1.0)
Monocytes Relative: 12.6 % — ABNORMAL HIGH (ref 3.0–12.0)
Neutro Abs: 2.3 10*3/uL (ref 1.4–7.7)
Neutrophils Relative %: 61.9 % (ref 43.0–77.0)
Platelets: 111 10*3/uL — ABNORMAL LOW (ref 150.0–400.0)
Potassium: 4.1 meq/L (ref 3.5–5.1)
Prothrombin Time: 23.2 s — ABNORMAL HIGH (ref 9.1–11.7)
RBC: 3.97 M/uL (ref 3.87–5.11)
RDW: 12.7 % (ref 11.5–14.6)
Sodium: 142 meq/L (ref 135–145)
WBC: 3.8 10*3/uL — ABNORMAL LOW (ref 4.5–10.5)
aPTT: 42.2 s — ABNORMAL HIGH (ref 21.7–28.8)

## 2009-04-01 ENCOUNTER — Ambulatory Visit (HOSPITAL_COMMUNITY): Admission: RE | Admit: 2009-04-01 | Discharge: 2009-04-01 | Payer: Self-pay | Admitting: Internal Medicine

## 2009-04-01 ENCOUNTER — Ambulatory Visit: Payer: Self-pay | Admitting: Internal Medicine

## 2009-04-07 ENCOUNTER — Encounter: Payer: Self-pay | Admitting: Internal Medicine

## 2009-04-12 ENCOUNTER — Ambulatory Visit: Payer: Self-pay

## 2009-04-12 ENCOUNTER — Encounter: Payer: Self-pay | Admitting: Internal Medicine

## 2009-04-16 ENCOUNTER — Ambulatory Visit: Payer: Self-pay | Admitting: Pulmonary Disease

## 2009-05-10 ENCOUNTER — Encounter: Payer: Self-pay | Admitting: Pulmonary Disease

## 2009-05-13 ENCOUNTER — Ambulatory Visit: Payer: Self-pay | Admitting: Cardiology

## 2009-05-13 LAB — CONVERTED CEMR LAB: POC INR: 2.7

## 2009-06-09 ENCOUNTER — Ambulatory Visit: Payer: Self-pay | Admitting: Internal Medicine

## 2009-06-09 LAB — CONVERTED CEMR LAB: POC INR: 2.1

## 2009-07-07 ENCOUNTER — Ambulatory Visit: Payer: Self-pay | Admitting: Internal Medicine

## 2009-07-07 LAB — CONVERTED CEMR LAB: POC INR: 2.2

## 2009-07-12 ENCOUNTER — Encounter: Payer: Self-pay | Admitting: Internal Medicine

## 2009-07-13 ENCOUNTER — Ambulatory Visit: Payer: Self-pay | Admitting: Internal Medicine

## 2009-07-28 ENCOUNTER — Ambulatory Visit: Payer: Self-pay | Admitting: Internal Medicine

## 2009-07-28 LAB — CONVERTED CEMR LAB: POC INR: 2.7

## 2009-08-10 ENCOUNTER — Ambulatory Visit: Payer: Self-pay | Admitting: Cardiovascular Disease

## 2009-08-25 ENCOUNTER — Ambulatory Visit: Payer: Self-pay | Admitting: Cardiology

## 2009-08-25 LAB — CONVERTED CEMR LAB: POC INR: 2.7

## 2009-09-22 ENCOUNTER — Ambulatory Visit: Payer: Self-pay | Admitting: Cardiology

## 2009-09-22 LAB — CONVERTED CEMR LAB: POC INR: 2.3

## 2009-10-13 ENCOUNTER — Ambulatory Visit: Payer: Self-pay | Admitting: Pulmonary Disease

## 2009-10-16 LAB — CONVERTED CEMR LAB
Albumin: 3.9 g/dL (ref 3.5–5.2)
Alkaline Phosphatase: 57 units/L (ref 39–117)
BUN: 13 mg/dL (ref 6–23)
Basophils Absolute: 0 10*3/uL (ref 0.0–0.1)
Basophils Relative: 0.3 % (ref 0.0–3.0)
CO2: 30 meq/L (ref 19–32)
Calcium: 9.5 mg/dL (ref 8.4–10.5)
Creatinine, Ser: 0.7 mg/dL (ref 0.4–1.2)
Eosinophils Relative: 1.2 % (ref 0.0–5.0)
GFR calc non Af Amer: 87.29 mL/min (ref >60)
Glucose, Bld: 81 mg/dL (ref 70–99)
HCT: 38.2 % (ref 36.0–46.0)
HDL: 59 mg/dL (ref >39.00)
Hemoglobin: 13.4 g/dL (ref 12.0–15.0)
LDL Cholesterol: 60 mg/dL (ref 0–99)
Lymphocytes Relative: 11.1 % — ABNORMAL LOW (ref 12.0–46.0)
Lymphs Abs: 0.9 10*3/uL (ref 0.7–4.0)
Monocytes Relative: 8.8 % (ref 3.0–12.0)
Neutro Abs: 6.5 10*3/uL (ref 1.4–7.7)
RBC: 4.11 M/uL (ref 3.87–5.11)
Sodium: 141 meq/L (ref 135–145)
TSH: 1.37 microintl units/mL (ref 0.35–5.50)
Total Protein: 6.7 g/dL (ref 6.0–8.3)
VLDL: 10.8 mg/dL (ref 0.0–40.0)
Vit D, 25-Hydroxy: 39 ng/mL (ref 30–89)
WBC: 8.3 10*3/uL (ref 4.5–10.5)

## 2009-10-25 ENCOUNTER — Ambulatory Visit: Payer: Self-pay | Admitting: Cardiology

## 2009-11-23 ENCOUNTER — Ambulatory Visit: Payer: Self-pay | Admitting: Cardiovascular Disease

## 2009-12-06 ENCOUNTER — Telehealth: Payer: Self-pay | Admitting: Cardiovascular Disease

## 2009-12-07 ENCOUNTER — Telehealth (INDEPENDENT_AMBULATORY_CARE_PROVIDER_SITE_OTHER): Payer: Self-pay | Admitting: *Deleted

## 2009-12-07 ENCOUNTER — Encounter: Payer: Self-pay | Admitting: Cardiovascular Disease

## 2009-12-07 ENCOUNTER — Ambulatory Visit: Payer: Self-pay | Admitting: Pulmonary Disease

## 2009-12-08 ENCOUNTER — Encounter: Payer: Self-pay | Admitting: Cardiovascular Disease

## 2009-12-08 LAB — CONVERTED CEMR LAB: Prothrombin Time: 24.9 s — ABNORMAL HIGH (ref 9.7–11.8)

## 2009-12-09 ENCOUNTER — Ambulatory Visit: Payer: Self-pay | Admitting: Cardiovascular Disease

## 2009-12-09 ENCOUNTER — Encounter: Payer: Self-pay | Admitting: Cardiovascular Disease

## 2009-12-09 ENCOUNTER — Ambulatory Visit: Payer: Self-pay

## 2009-12-09 ENCOUNTER — Encounter: Payer: Self-pay | Admitting: Internal Medicine

## 2009-12-21 ENCOUNTER — Ambulatory Visit: Payer: Self-pay | Admitting: Cardiology

## 2009-12-21 LAB — CONVERTED CEMR LAB: POC INR: 2.9

## 2009-12-29 ENCOUNTER — Ambulatory Visit: Payer: Self-pay

## 2010-01-07 ENCOUNTER — Encounter (INDEPENDENT_AMBULATORY_CARE_PROVIDER_SITE_OTHER): Payer: Self-pay | Admitting: *Deleted

## 2010-01-19 ENCOUNTER — Ambulatory Visit: Payer: Self-pay | Admitting: Cardiology

## 2010-01-26 ENCOUNTER — Telehealth (INDEPENDENT_AMBULATORY_CARE_PROVIDER_SITE_OTHER): Payer: Self-pay | Admitting: *Deleted

## 2010-01-26 ENCOUNTER — Ambulatory Visit
Admission: RE | Admit: 2010-01-26 | Discharge: 2010-01-26 | Payer: Self-pay | Source: Home / Self Care | Attending: Cardiology | Admitting: Cardiology

## 2010-02-03 ENCOUNTER — Encounter
Admission: RE | Admit: 2010-02-03 | Discharge: 2010-02-03 | Payer: Self-pay | Source: Home / Self Care | Attending: Neurology | Admitting: Neurology

## 2010-02-03 ENCOUNTER — Encounter: Payer: Self-pay | Admitting: Cardiovascular Disease

## 2010-02-16 ENCOUNTER — Encounter: Payer: Self-pay | Admitting: Cardiovascular Disease

## 2010-02-16 ENCOUNTER — Ambulatory Visit
Admission: RE | Admit: 2010-02-16 | Discharge: 2010-02-16 | Payer: Self-pay | Source: Home / Self Care | Attending: Cardiovascular Disease | Admitting: Cardiovascular Disease

## 2010-02-16 ENCOUNTER — Ambulatory Visit: Admission: RE | Admit: 2010-02-16 | Discharge: 2010-02-16 | Payer: Self-pay | Source: Home / Self Care

## 2010-02-16 ENCOUNTER — Encounter: Payer: Self-pay | Admitting: Internal Medicine

## 2010-02-17 ENCOUNTER — Encounter: Payer: Self-pay | Admitting: Cardiovascular Disease

## 2010-02-17 ENCOUNTER — Encounter: Payer: Self-pay | Admitting: Pulmonary Disease

## 2010-02-24 NOTE — Procedures (Signed)
Summary: pacer check/ewj   Current Medications (verified): 1)  Coumadin 5 Mg  Tabs (Warfarin Sodium) .... Take As Directed By The [facility].Marland KitchenMarland Kitchen 2)  Bayer Low Strength 81 Mg  Tbec (Aspirin) .... Take 1 Tablet By Mouth Once A Day 3)  Acebutolol Hcl 200 Mg  Caps (Acebutolol Hcl) .... Once Daily 4)  Lipitor 20 Mg  Tabs (Atorvastatin Calcium) .... Take 1 Tablet By Mouth Once A Day 5)  Synthroid 75 Mcg  Tabs (Levothyroxine Sodium) .... Take 1 Tab By Mouth Once Daily.Marland KitchenMarland Kitchen 6)  Lorazepam 1 Mg  Tabs (Lorazepam) .... Take 1/2 To 1 Tab By Mouth Up To 3 Times Daily As Directed... 7)  Lexapro 10 Mg Tabs (Escitalopram Oxalate) .... Tale 1 Tab By Mouth Once Daily...  Allergies (verified): 1)  ! * All "qt" Drugs  PPM Specifications Following MD:  Sherryl Manges, MD     PPM Vendor:  Medtronic     PPM Model Number:  [id number]     PPM Serial Number:  [id number] PPM DOI:  11/22/2001     PPM Implanting MD:  Sherryl Manges, MD  Lead 1    Location: RA     DOI: 11/22/2001     Model #: [id number]     Serial #: [id number]     Status: active Lead 2    Location: RV     DOI: 11/28/1996     Model #: [id number]     Serial #: [id number]     Status: active  Magnet Response Rate:  BOL 85 ERI  65  Indications:  Huston Foley   PPM Follow Up Remote Check?  No Battery Voltage:  2.63 V     Battery Est. Longevity:  ERI     Pacer Dependent:  No     Right Ventricle  Amplitude: 8.0 mV, Impedance: 718 ohms, Threshold: 1.0 V at 0.46 msec  Episodes Coumadin:  Yes Ventricular High Rate:  16     Ventricular Pacing:  98.8%  Parameters Mode:  VVI     Lower Rate Limit:  65     Upper Rate Limit:  150 Paced AV Delay:  250     Sensed AV Delay:  160 Tech Comments:  Device @ ERI 02/28/2009, reverted to VVI.  16VHR episodes the longest 51 seconds.  To be scheduled with Dr. Graciela Husbands. Altha Harm, LPN  March 24, 4257 3:28 PM

## 2010-02-24 NOTE — Medication Information (Signed)
Summary: rov/ewj  Anticoagulant Therapy  Managed by: Cloyde Reams, RN Referring MD: Sherryl Manges MD PCP: Dr Alroy Dust Supervising MD: Riley Kill MD, Maisie Fus Indication 1: Atrial Fibrillation (ICD-427.31) Indication 2: TIA (ICD-435.9) Lab Used: [facility] Site: [facility] INR POC 2.2 INR RANGE 2.5 - 3.5  Dietary changes: no    Health status changes: no    Bleeding/hemorrhagic complications: no    Recent/future hospitalizations: no    Any changes in medication regimen? no    Recent/future dental: no  Any missed doses?: no       Is patient compliant with meds? yes       Allergies: 1)  ! * All "qt" Drugs  Anticoagulation Management History:      The patient is taking warfarin and comes in today for a routine follow up visit.  Positive risk factors for bleeding include history of CVA/TIA.  Negative risk factors for bleeding include an age less than 81 years old.  The bleeding index is 'intermediate risk'.  Positive CHADS2 values include Prior Stroke/CVA/TIA.  Negative CHADS2 values include Age > 7 years old.  The start date was 01/23/1998.  Anticoagulation responsible provider: Riley Kill MD, Maisie Fus.  INR POC: 2.2.  Cuvette Lot#: [id number].  Exp: 05/2010.    Anticoagulation Management Assessment/Plan:      The patient's current anticoagulation dose is Coumadin 5 mg  tabs: take as directed by the [facility] Clinic....  The target INR is 2.5-3.5.  The next INR is due 04/21/2009.  Anticoagulation instructions were given to patient.  Results were reviewed/authorized by Cloyde Reams, RN.  She was notified by Cloyde Reams RN.         Prior Anticoagulation Instructions: INR 3.1  Continue on same dosage 7.5mg  daily except 10mg  on Mondays.  Recheck in 3 weeks.    Current Anticoagulation Instructions: INR 2.2  Take 10mg  today then resume same dosage 7.5mg  daily except 10mg  on Mondays.  Recheck in 3 weeks.

## 2010-02-24 NOTE — Progress Notes (Signed)
  Phone Note Other Incoming   Caller: pt walked into office Summary of Call: pt walked into the office today to report another episode of confusion, loss of speech and headache. it occured around 2:30p today while in [place]. pt feels fine now and came here because she did not know what to do. discussed with dr [name], will have INR checked today and pt to follow up with dr Anne Hahn. Deliah Goody, RN  January 26, 2010 4:30 PM

## 2010-02-24 NOTE — Medication Information (Signed)
Summary: rov/tm  Anticoagulant Therapy  Managed by: Bethena Midget, RN, BSN Referring MD: Sherryl Manges MD PCP: Dr Alroy Dust Supervising MD: Gala Romney MD, Reuel Boom Indication 1: Atrial Fibrillation (ICD-427.31) Indication 2: TIA (ICD-435.9) Lab Used: [facility] Site: [facility] INR POC 2.2 INR RANGE 2.5 - 3.5  Dietary changes: yes       Details: Eating more salads  Health status changes: no    Bleeding/hemorrhagic complications: no    Recent/future hospitalizations: no    Any changes in medication regimen? no    Recent/future dental: no  Any missed doses?: no       Is patient compliant with meds? yes       Allergies: 1)  ! * All "qt" Drugs 2)  * Adhesive Tape  Anticoagulation Management History:      The patient is taking warfarin and comes in today for a routine follow up visit.  Positive risk factors for bleeding include history of CVA/TIA.  Negative risk factors for bleeding include an age less than 72 years old.  The bleeding index is 'intermediate risk'.  Positive CHADS2 values include Prior Stroke/CVA/TIA.  Negative CHADS2 values include Age > 17 years old.  The start date was 01/23/1998.  Her last INR was 2.2 ratio.  Anticoagulation responsible provider: Bensimhon MD, Reuel Boom.  INR POC: 2.2.  Cuvette Lot#: [id number].  Exp: 08/2010.    Anticoagulation Management Assessment/Plan:      The patient's current anticoagulation dose is Coumadin 5 mg  tabs: take as directed by the [facility]....  The target INR is 2.5-3.5.  The next INR is due 07/28/2009.  Anticoagulation instructions were given to patient.  Results were reviewed/authorized by Bethena Midget, RN, BSN.  She was notified by Bethena Midget, RN, BSN.         Prior Anticoagulation Instructions: INR 2.1 Today take 10mg  then resume 7.5mg  daily except 10mg  on Mondays. Recheck in 4 weeks.   Current Anticoagulation Instructions: INR 2.2 Today take 10mg s then change dose to 7.5mg s everyday except 10mg s on Mondays and  Wednesdays.

## 2010-02-24 NOTE — Letter (Signed)
Summary: Implantable Device Instructions  Architectural technologist, Main Office  [address]   [place], [place] [address]   Phone: [phone]  Fax: [phone]      Implantable Device Instructions  You are scheduled for:  _____ Permanent Transvenous Pacemaker _____ Implantable Cardioverter Defibrillator _____ Implantable Loop Recorder     X      Generator Change  on 04/01/09 with DR. Aubriel Khanna.  1.  Please arrive at the Short Stay Center at [facility] at 0530 on the day of your procedure.  2.  Do not eat or drink the night before your procedure.  3.  Complete lab work today 03/30/09.  The lab at [facility] is open from 8:30 AM to 1:30 PM and from 2:30 PM to 5:00 PM.  The lab at [facility] is open from 7:30 AM to 5:30 PM.  You do not have to be fasting.  4.  You may take all of your med with a small sip of water the morning of your procedure. Make sure you take your Coumadin.  5.  Plan for an overnight stay.  Bring your insurance cards and a list of your medications.  6.  Wash your chest and neck with antibacterial soap (any brand) the evening before and the morning of your procedure.  Rinse well.    *If you have ANY questions after you get home, please call the office [phone].  *Every attempt is made to prevent procedures from being rescheduled.  Due to the nauture of Electrophysiology, rescheduling can happen.  The physician is always aware and directs the staff when this occurs.

## 2010-02-24 NOTE — Assessment & Plan Note (Signed)
Summary: rov 3 months w/ fast labs and cxr //kp   Primary Care Provider:  Dr Alroy Dust  CC:  4 month ROV & review of mult medical problems....  History of Present Illness: 60 y/o WF here for a follow up visit... she has mult med problems as noted below... she is followed regularly by Drs Azucena Fallen for Cardiology, and Drs Purcell Nails for GI...   ~  May 29, 2008:  Amy Lane has had a good year overall... 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 her last f/u appts w/ Cards was in Nov09- notes reviewed... she has several minor complaints/ concerns today:      ** c/o pain in hands- fingers & wrist c/w arthritis- discussed Rx w/ MOBIC Prn, and warm soaks...      ** c/o runny nose- we discussed trial of antihist like Zyrtek, and ASTEPRO spray...      ** c/o gas- we discussed avoiding gas forming foods and trying Mylicon/ Gas-X/ Phazyme...      ** she notes occas "throat spasms" that she wants to blame on her carotid art... it sounds more like cricopharyngeus dysfuction and I advised her to try 1/2 to 1 Loarazepam tab as early as poss...   ~  December 15, 2008: Pat saw DrMcPhail for routine check 7/10 w/ Dx of "kraurosis vulvae"- prob related to her Ehlers-Danlos syndrome... she is quite upset & concerned & has lost her appetite & lost some weight... she is taking Ensure Tid, but notes depressed and we discussed trial Lexapro for this... she denies pain anywhere, no n/v, no d/c/blood, no discharge etc... she had f/u DrKlein 7/10- pacer fine, no med changes, on Coumadin... she also saw DrNishan 6/10- s/p MVR, PAF & SSS w/ pacer, Carotid dis w/ CAE (no changes made)...   ~  April 16, 2009:  she states that she is feeling "great" & feels the Lexapro is really helping... had dual chamber pacemaker replaced 3/10 by DrKlein (for end-of-life), and she's gained 5# on Ensure  supplements... she also saw DrNishan 12/10 for f/u of her Ehlers-Danlos, dysautonomia, SSS w/ pacer, carotid dis w/ tia, & MVP... CDopplers 2/11 stable... she is looking forward to an appt at [facility] in their [facility] for a second opinion regarding her Kraurosis Vulvae.    Current Problem List:  ALLERGIC RHINITIS (ICD-477.9) - we discussed Rx w/ Zyrtek, Astepro, etc...  ~  CXR 3/11 showed left pacer, sternal wires, NAD...  MITRAL VALVE PROLAPSE (ICD-424.0) - followed by Walker Kehr...  ~  2DEcho 11/08 showed myxomatous MV, annuloplasty ring, decr post leaflet excursion, norm LVH &     wall motion...  ~  NuclearStressTest 11/08 showed mild apical thinning... prev study 12/04 w/o ischemia or infarct &     EF=68%...  ~  she saw Walker Kehr 11/09 for f/u MVP (s/p MV repair), dysautonomia (s/p pacer), & Ehlers-Danlos w/ a dissected left carotid art... rec to continue antiplat Rx and Coumadin- f/u Carotid CTA was OK.  ~  she sees [place] every 6 months- last 12/10 & note reviewed> no changes made.  Hx of BRADYCARDIA-TACHYCARDIA SYNDROME (ICD-427.81) - on SECTRAL 200mg Bid & COUMADIN... hx AFib/Flutter w/ ablation & pacemaker placed... she has Ehlers-Danlos synd w/ prolonged QT interval...   CARDIAC PACEMAKER IN SITU (ICD-V45.01) - f/u DrKlein w/ CTChest 4/09- neg.  ~  dual chamber pacer changed  3/11 by DrKlein for end-of-life...  DYSAUTONOMIA (ICD-742.8) - prev on PROAMATINE 5mg  tabs per DrKlein, but it was stopped...  CEREBROVASCULAR DISEASE (ICD-437.9) - on ASA 81mg /d & COUMADIN (followed in the clinic)... she gets CDoppler's from the vasc lab... she had a Left CAE 11/05 by [facility] for symptomatic left carotid stenosis...  ~  Carotid angiogram 5/08 by [facility] showed <30% right carotid stenosis  ~  f/u CTA of Head & Neck 12/09 was neg= norm CTA head, & no signif carotid dis noted in neck.  ~  CDoppler 2/11 showed stable mild carotid dis, left CAE w/ PDA is patent, 0-39% bilat ICA  stenoses.  HYPERCHOLESTEROLEMIA (ICD-272.0) - on LIPITOR 20mg /d...  ~  FLP 05/13/07 on Lip20 showed TChol 119, TG 50, HDL 55, LDL 54  ~  FLP 5/10 on Lip20 showed TChol 120, TG 43, HDL 58, LDL 54  HYPOTHYROIDISM (ICD-244.9) - currently on SYNTHROID 33mcg/d...  ~  labs 4/09 on Levoth88 showed TSH = 0.10... rec to decr Synthroid to 88mcg/d...  ~  labs 9/09 on Levoth75 showed TSH = 1.67  ~  labs 5/10 on Levoth75 showed TSH = 2.92  HEMORRHOIDS (ICD-455.6) - last colonoscopy 2/09 by DrJacobs showed only sm hems... f/u 66yrs.  GYN - followed by DrMcPhail and Dx w/ kraurosis vulvae 7/10... on PREMARIN VagCream.  ~  11/10: she discussed the Dx w/ me & will inquire about poss hyst/ BSO...  ~  3/11:  she has appt in the [facility] for 2nd opinion.  FIBROMYALGIA (ICD-729.1) - she has been doing Yoga classes and this has helped...  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  Hx of TRANSIENT ISCHEMIC ATTACK (ICD-435.9) - she has seen DrWillis in the past... s/p left carotid endarterectomy 11/05 [facility].  Hx of SYNCOPE (ICD-780.2) - felt to be neurally mediated, no recur since pacer placed...  DEPRESSION (ICD-311) - she takes LORAZEPAM 1mg Qhs for insomnia... she prev saw DrGraves for counselling in [place]...  ~  11/10: poor appetite & some wt loss prob related to depression & we discussed trial LEXAPRO 10mg /d.  ~  3/11: she is very pleased- feels better, gained 5# w/ Ensure, etc...  CARCINOMA, BASAL CELL (ICD-173.9) - skin cancer removed from left leg by [facility] 2008.   Allergies (verified): 1)  ! * All "qt" Drugs 2)  * Adhesive Tape  Comments:  Nurse/Medical Assistant: The patient's medications and allergies were reviewed with the patient and were updated in the Medication and Allergy Lists. Boone Master CNA  April 16, 2009 9:26 AM   Past History:  Past Medical History:  SVT/ PSVT/ PAT (ICD-427.0) Hx of BRADYCARDIA-TACHYCARDIA SYNDROME  (ICD-427.81) ATRIAL FIBRILLATION (ICD-427.31) DYSAUTONOMIA (ICD-742.8) Hx of SYNCOPE (ICD-780.2) CARDIAC PACEMAKER DDD MDT (ICD-V45.01)-Medtronic Kappa 901 COUMADIN THERAPY (ICD-V58.61)  ALLERGIC RHINITIS (ICD-477.9) MITRAL VALVE PROLAPSE (ICD-424.0) CEREBROVASCULAR DISEASE (ICD-437.9) HYPERCHOLESTEROLEMIA (ICD-272.0) HYPOTHYROIDISM (ICD-244.9) HEMORRHOIDS (ICD-455.6) EHLERS-DANLOS SYNDROME (ICD-756.83) FIBROMYALGIA (ICD-729.1) Hx of TRANSIENT ISCHEMIC ATTACK (ICD-435.9) DEPRESSION (ICD-311) CARCINOMA, BASAL CELL (ICD-173.9)  Past Surgical History: S/P complex mitral valve repair at the [facility] S/P left carotid endarterectomy 11/05 by [facility] pacemaker - medtronic kappa 901  Family History: Reviewed history from 06/11/2008 and no changes required. Family History of Cancer:  Family History of Coronary Artery Disease:   Social History: Reviewed history from 06/24/2008 and no changes required. smoked socially in her early 74's Married  Alcohol Use - yes No kids  Review of Systems      See HPI  The patient denies anorexia, fever,  weight loss, weight gain, vision loss, decreased hearing, hoarseness, chest pain, syncope, dyspnea on exertion, peripheral edema, prolonged cough, headaches, hemoptysis, abdominal pain, melena, hematochezia, severe indigestion/heartburn, hematuria, incontinence, muscle weakness, suspicious skin lesions, transient blindness, difficulty walking, depression, unusual weight change, abnormal bleeding, enlarged lymph nodes, and angioedema.    Vital Signs:  Patient profile:   60 year old female Height:      70 inches Weight:      136 pounds O2 Sat:      98 % on Room air Temp:     97.4 degrees F oral Pulse rate:   65 / minute BP sitting:   100 / 60  (left arm) Cuff size:   regular  Vitals Entered By: Boone Master CNA (April 16, 2009 9:26 AM)  O2 Flow:  Room air  Physical Exam  Additional Exam:  WD, Thin, 60 y/o WF in NAD... she is  5'11"Tall and 136# = BMI ~19... GENERAL:  Alert & oriented; pleasant & cooperative... HEENT:  [place]/AT, EOM-wnl, PERRLA, EACs-clear, TMs-wnl, NOSE-clear, THROAT-clear & wnl. NECK:  Supple w/ fair ROM; no JVD; normal carotid impulses w/o bruits, left CAE scar; no thyromegaly or nodules palpated;  no lymphadenopathy. CHEST:  Clear to P & A; without wheezes/ rales/ or rhonchi heard... HEART:  Regular Rhythm; pacer on left, without murmurs/ rubs/ or gallops detected... ABDOMEN:  Soft & nontender; normal bowel sounds; no organomegaly or masses palpated... EXT: without deformities or arthritic changes; no varicose veins/ venous insuffic/ or edema. NEURO:  CN's intact;  no focal neuro deficits... DERM:  s/p skin cancer removed from left leg...    Impression & Recommendations:  Problem # 1:  CARDIAC>>> Followed by Walker Kehr & DrKlein... pacer changed 3/11., continue Coumadin, ASA, Acetbutolol...  Problem # 2:  CEREBROVASCULAR DISEASE (ICD-437.9) Stable on meds and asymptomatic...  Problem # 3:  HYPERCHOLESTEROLEMIA (ICD-272.0) Continue Lipitor w/ FLP later... Her updated medication list for this problem includes:    Lipitor 20 Mg Tabs (Atorvastatin calcium) .Marland Kitchen... Take 1 tablet by mouth once a day  Problem # 4:  HYPOTHYROIDISM (ICD-244.9) Stable on Synthroid... Her updated medication list for this problem includes:    Synthroid 75 Mcg Tabs (Levothyroxine sodium) .Marland Kitchen... Take 1 tab by mouth once daily...  Problem # 5:  DEPRESSION (ICD-311) She feels much better overall... continue Rx... Her updated medication list for this problem includes:    Lorazepam 1 Mg Tabs (Lorazepam) .Marland Kitchen... Take 1/2 to 1 tab by mouth up to 3 times daily as directed...    Lexapro 10 Mg Tabs (Escitalopram oxalate) .Marland Kitchen... Tale 1 tab by mouth once daily...  Problem # 6:  GYN>>> She has appt at [facility] 05/10/09...  Complete Medication List: 1)  Coumadin 5 Mg Tabs (Warfarin sodium) .... Take as directed by the coumadin  clinic.Marland KitchenMarland Kitchen 2)  Bayer Low Strength 81 Mg Tbec (Aspirin) .... Take 1 tablet by mouth once a day 3)  Acebutolol Hcl 200 Mg Caps (Acebutolol hcl) .... Once daily 4)  Lipitor 20 Mg Tabs (Atorvastatin calcium) .... Take 1 tablet by mouth once a day 5)  Synthroid 75 Mcg Tabs (Levothyroxine sodium) .... Take 1 tab by mouth once daily.Marland KitchenMarland Kitchen 6)  Lorazepam 1 Mg Tabs (Lorazepam) .... Take 1/2 to 1 tab by mouth up to 3 times daily as directed... 7)  Lexapro 10 Mg Tabs (Escitalopram oxalate) .... Tale 1 tab by mouth once daily...  Other Orders: Tdap => 58yrs IM (47829) Admin 1st Vaccine (56213)  Patient Instructions: 1)  Today  we updated your med list- see below.... 2)  We refilled your Lexapro for 3 month supply x4... 3)  Today we gave you the TDAP combination Tetanus vaccine (it should be good for 56yrs)... 4)  Call for any problems.Marland KitchenMarland Kitchen 5)  Please schedule a follow-up appointment in 6 months, and we will plan to check your FASTING blood work at that time...   Immunizations Administered:  Tetanus Vaccine:    Vaccine Type: Tdap    Site: right deltoid    Mfr: GlaxoSmithKline    Dose: 0.5 ml    Route: IM    Given by: Reynaldo Minium CMA    Exp. Date: 04/17/2011    Lot #: [id number]    VIS given: 12/11/06 version given April 16, 2009.

## 2010-02-24 NOTE — Procedures (Signed)
Summary: 6 mo f/u   Current Medications (verified): 1)  Coumadin 5 Mg  Tabs (Warfarin Sodium) .... Take As Directed By The [facility] Clinic.Marland KitchenMarland Kitchen 2)  Bayer Low Strength 81 Mg  Tbec (Aspirin) .... Take 1 Tablet By Mouth Once A Day 3)  Acebutolol Hcl 200 Mg  Caps (Acebutolol Hcl) .... Once Daily 4)  Lipitor 20 Mg  Tabs (Atorvastatin Calcium) .... Take 1 Tablet By Mouth Once A Day 5)  Synthroid 75 Mcg  Tabs (Levothyroxine Sodium) .... Take 1 Tab By Mouth Once Daily.Marland KitchenMarland Kitchen 6)  Lorazepam 1 Mg  Tabs (Lorazepam) .... Take 1/2 To 1 Tab By Mouth Up To 3 Times Daily As Directed... 7)  Lexapro 10 Mg Tabs (Escitalopram Oxalate) .... Tale 1 Tab By Mouth Once Daily...  Allergies (verified): 1)  ! * All "qt" Drugs   PPM Specifications Following MD:  Sherryl Manges, MD     PPM Vendor:  Medtronic     PPM Model Number:  [id number]     PPM Serial Number:  [id number] PPM DOI:  11/22/2001     PPM Implanting MD:  Sherryl Manges, MD  Lead 1    Location: RA     DOI: 11/22/2001     Model #: [id number]     Serial #: [id number]     Status: active Lead 2    Location: RV     DOI: 11/28/1996     Model #: [id number]     Serial #: [id number]     Status: active  Magnet Response Rate:  BOL 85 ERI  65  Indications:  Huston Foley   PPM Follow Up Remote Check?  No Battery Voltage:  2.62 V     Battery Est. Longevity:  6 months     Pacer Dependent:  No       PPM Device Measurements [facility]  Amplitude: 2.8 mV, Impedance: 447 ohms, Threshold: 0.75 V at 0.4 msec Right Ventricle  Amplitude: 11.20 mV, Impedance: 708 ohms, Threshold: 1.0 V at 0.46 msec  Episodes MS Episodes:  0     Percent Mode Switch:  0     Coumadin:  Yes  Parameters Mode:  DDDR     Lower Rate Limit:  70     Upper Rate Limit:  150 Paced AV Delay:  250     Sensed AV Delay:  160 Tech Comments:  No parameter changes.  Device function normal.  Rate response blunted but the patient activity level is good without any restrictions.  32 VHR  the longest 39 seconds, the patient has been  asymptomatic.    Battery near ERI, we will check her monthly with her coumadin visits.   Altha Harm, LPN  January 27, 2009 2:57 PM

## 2010-02-24 NOTE — Cardiovascular Report (Signed)
Summary: Office Visit   Office Visit   Imported By: Roderic Ovens 04/15/2009 10:11:24  _____________________________________________________________________  External Attachment:    Type:   Image     Comment:   External Document

## 2010-02-24 NOTE — Medication Information (Signed)
Summary: rov/sp  Anticoagulant Therapy  Managed by: Windell Hummingbird, RN Referring MD: Sherryl Manges MD PCP: Dr Alroy Dust Supervising MD: [name] MD, Theron Arista Indication 1: Atrial Fibrillation (ICD-427.31) Indication 2: TIA (ICD-435.9) Lab Used: [facility] Site: [facility] INR POC 2.3 INR RANGE 2.5 - 3.5  Dietary changes: yes       Details: Eating  more salads  Health status changes: no    Bleeding/hemorrhagic complications: no    Recent/future hospitalizations: no    Any changes in medication regimen? no    Recent/future dental: no  Any missed doses?: no       Is patient compliant with meds? yes       Allergies: 1)  ! * All "qt" Drugs 2)  * Adhesive Tape  Anticoagulation Management History:      The patient is taking warfarin and comes in today for a routine follow up visit.  Positive risk factors for bleeding include history of CVA/TIA.  Negative risk factors for bleeding include an age less than 66 years old.  The bleeding index is 'intermediate risk'.  Positive CHADS2 values include Prior Stroke/CVA/TIA.  Negative CHADS2 values include Age > 39 years old.  The start date was 01/23/1998.  Her last INR was 2.3 ratio.  Anticoagulation responsible provider: [name] MD, Theron Arista.  INR POC: 2.3.  Cuvette Lot#: [id number].  Exp: 01/2011.    Anticoagulation Management Assessment/Plan:      The patient's current anticoagulation dose is Coumadin 5 mg  tabs: take as directed by the Coumadin Clinic....  The target INR is 2.5-3.5.  The next INR is due 03/09/2010.  Anticoagulation instructions were given to patient.  Results were reviewed/authorized by Windell Hummingbird, RN.  She was notified by Windell Hummingbird, RN.         Prior Anticoagulation Instructions: INR 3.0  Coumadin 5mg  tabs - 2 tabs on MON and WED, 1.5 tabs all other days  Current Anticoagulation Instructions: INR 2.3 Eating more salads. Take 2 1/2 tablets today and then resume 1.5 tablets every day except 2 tablets on Monday and  Wednesday. Recheck in 3 weeks

## 2010-02-24 NOTE — Medication Information (Signed)
Summary: rov/tm  Anticoagulant Therapy  Managed by: Weston Brass, PharmD Referring MD: Sherryl Manges MD PCP: Dr Alroy Dust Supervising MD: Johney Frame MD, Fayrene Fearing Indication 1: Atrial Fibrillation (ICD-427.31) Indication 2: TIA (ICD-435.9) Lab Used: [facility] Site: [facility] INR POC 2.7 INR RANGE 2.5 - 3.5  Dietary changes: no    Health status changes: no    Bleeding/hemorrhagic complications: yes       Details: bruising on inner thigh from falling  Recent/future hospitalizations: no    Any changes in medication regimen? no    Recent/future dental: no  Any missed doses?: no       Is patient compliant with meds? yes       Allergies: 1)  ! * All "qt" Drugs 2)  * Adhesive Tape  Anticoagulation Management History:      Positive risk factors for bleeding include history of CVA/TIA.  Negative risk factors for bleeding include an age less than 66 years old.  The bleeding index is 'intermediate risk'.  Positive CHADS2 values include Prior Stroke/CVA/TIA.  Negative CHADS2 values include Age > 42 years old.  The start date was 01/23/1998.  Her last INR was 2.2 ratio.  Anticoagulation responsible provider: Brynnan Rodenbaugh MD, Fayrene Fearing.  INR POC: 2.7.  Cuvette Lot#: [id number].  Exp: 09/2010.    Anticoagulation Management Assessment/Plan:      The patient's current anticoagulation dose is Coumadin 5 mg  tabs: take as directed by the Coumadin Clinic....  The target INR is 2.5-3.5.  The next INR is due 08/25/2009.  Anticoagulation instructions were given to patient.  Results were reviewed/authorized by Weston Brass, PharmD.  She was notified by Dillard Cannon.         Prior Anticoagulation Instructions: INR 2.2 Today take 10mg s then change dose to 7.5mg s everyday except 10mg s on Mondays and Wednesdays.   Current Anticoagulation Instructions: INR 2.7  Continue same regimen of 2 tabs on M, W and 1.5 tabs all other days.

## 2010-02-24 NOTE — Assessment & Plan Note (Signed)
Summary: generator change per paula ok per melanie   Visit Type:  Follow-up Primary Provider:  Dr Alroy Dust  CC:  Device at [facility].  History of Present Illness: Pt has no complaints. She is here for PPM Generator change. She saw Gunnar Fusi in [employer] on 03/24/09.  Mrs. Sproule is seen in followup for sinus bradycardia status post pacemaker now ERI. She also has a history of Ehlers-Danlos syndrome in the context of a marfanoid habitus, mitral valve prolapse status post mitral valve repair at the [facility] clinic with prior TIA on chronic Coumadin as well as syncope due to diagnosis of long QT syndrome.  At her last generator replacement undertaken a [place], we had discussions with Dr. Earna Coder regarding an ICD vs PM and the latter was inserted.  She had been doing really very well until about a week or 2 ago. At that time she noticed significant changes in her exercise performance. She came in for evaluation  and was found to have reverted and be at [facility].  The patient otherwise denies chest pain, edema or palpitations        Current Problems (verified): 1)  Svt/ Psvt/ Pat  (ICD-427.0) 2)  Hx of Bradycardia-tachycardia Syndrome  (ICD-427.81) 3)  Atrial Fibrillation  (ICD-427.31) 4)  Dysautonomia  (ICD-742.8) 5)  Hx of Syncope  (ICD-780.2) 6)  Cardiac Pacemaker Ddd Mdt  (ICD-V45.01) 7)  Coumadin Therapy  (ICD-V58.61) 8)  Allergic Rhinitis  (ICD-477.9) 9)  Mitral Valve Prolapse  (ICD-424.0) 10)  Cerebrovascular Disease  (ICD-437.9) 11)  Hypercholesterolemia  (ICD-272.0) 12)  Hypothyroidism  (ICD-244.9) 13)  Hemorrhoids  (ICD-455.6) 14)  Ehlers-danlos Syndrome  (ICD-756.83) 15)  Fibromyalgia  (ICD-729.1) 16)  Hx of Transient Ischemic Attack  (ICD-435.9) 17)  Depression  (ICD-311) 18)  Carcinoma, Basal Cell  (ICD-173.9)  Current Medications (verified): 1)  Coumadin 5 Mg  Tabs (Warfarin Sodium) .... Take As Directed By The Coumadin Clinic.Marland KitchenMarland Kitchen 2)  Bayer Low Strength 81 Mg  Tbec  (Aspirin) .... Take 1 Tablet By Mouth Once A Day 3)  Acebutolol Hcl 200 Mg  Caps (Acebutolol Hcl) .... Once Daily 4)  Lipitor 20 Mg  Tabs (Atorvastatin Calcium) .... Take 1 Tablet By Mouth Once A Day 5)  Synthroid 75 Mcg  Tabs (Levothyroxine Sodium) .... Take 1 Tab By Mouth Once Daily.Marland KitchenMarland Kitchen 6)  Lorazepam 1 Mg  Tabs (Lorazepam) .... Take 1/2 To 1 Tab By Mouth Up To 3 Times Daily As Directed... 7)  Lexapro 10 Mg Tabs (Escitalopram Oxalate) .... Tale 1 Tab By Mouth Once Daily...  Allergies (verified): 1)  ! * All "qt" Drugs  Past History:  Past Medical History: Last updated: 03/29/2009 SVT/ PSVT/ PAT (ICD-427.0) Hx of BRADYCARDIA-TACHYCARDIA SYNDROME (ICD-427.81) ATRIAL FIBRILLATION (ICD-427.31) DYSAUTONOMIA (ICD-742.8) Hx of SYNCOPE (ICD-780.2) CARDIAC PACEMAKER DDD MDT (ICD-V45.01)-Medtronic Kappa 901 COUMADIN THERAPY (ICD-V58.61)  ALLERGIC RHINITIS (ICD-477.9) MITRAL VALVE PROLAPSE (ICD-424.0) CEREBROVASCULAR DISEASE (ICD-437.9) HYPERCHOLESTEROLEMIA (ICD-272.0) HYPOTHYROIDISM (ICD-244.9) HEMORRHOIDS (ICD-455.6) EHLERS-DANLOS SYNDROME (ICD-756.83) FIBROMYALGIA (ICD-729.1) Hx of TRANSIENT ISCHEMIC ATTACK (ICD-435.9) DEPRESSION (ICD-311) CARCINOMA, BASAL CELL (ICD-173.9)  Past Surgical History: Last updated: 03/29/2009 S/P complex mitral valve repair at the [facility] S/P left carotid endarterectomy 11/05 by [facility] pacemaker - medtronic kappa 901  Family History: Last updated: 06/11/2008 Family History of Cancer:  Family History of Coronary Artery Disease:   Social History: Last updated: 06/24/2008 smoked socially in her early 20's Married  Alcohol Use - yes No kids  Risk Factors: Smoking Status: quit (01/28/2007)  Vital Signs:  Patient profile:  60 year old female Height:      70 inches Weight:      132 pounds BMI:     19.01 Pulse rate:   66 / minute Pulse rhythm:   regular BP sitting:   110 / 52  (right arm) Cuff size:   regular  Vitals Entered  By: Judithe Modest CMA (March 30, 2009 8:37 AM)  Physical Exam  General:  The patient was alert and oriented in no acute distress. HEENT Normal.  Neck veins were flat, carotids were brisk.  Lungs were clear.  Heart sounds were regular without murmurs or gallops.  Abdomen was soft with active bowel sounds. There is no clubbing cyanosis or edema. Skin Warm and dry neuro grossly normal    PPM Specifications Following MD:  Sherryl Manges, MD     PPM Vendor:  Medtronic     PPM Model Number:  [id number]     PPM Serial Number:  [id number] PPM DOI:  11/22/2001     PPM Implanting MD:  Sherryl Manges, MD  Lead 1    Location: [facility]     DOI: 11/22/2001     Model #: [id number]     Serial #: [id number]     Status: active Lead 2    Location: RV     DOI: 11/28/1996     Model #: [id number]     Serial #: [id number]     Status: active  Magnet Response Rate:  BOL 85 ERI  65  Indications:  Huston Foley   PPM Follow Up Pacer Dependent:  No      Episodes Coumadin:  Yes  Parameters Mode:  DDDR     Lower Rate Limit:  70     Upper Rate Limit:  150 Paced AV Delay:  250     Sensed AV Delay:  160  Impression & Recommendations:  Problem # 1:  CARDIAC PACEMAKER DDD MDT (ICD-V45.01) Device parameters and data were reviewed and no changes were made  she has reverted.  We discussed potential benefits and risks including not limited to infection lead fracture she understands these risks and is willing to proceed. A preoperative laboratory and chest x-ray will be obtained  Problem # 2:  SVT/ PSVT/ PAT (ICD-427.0) quiescient Her updated medication list for this problem includes:    Coumadin 5 Mg Tabs (Warfarin sodium) .Marland Kitchen... Take as directed by the coumadin clinic...    Bayer Low Strength 81 Mg Tbec (Aspirin) .Marland Kitchen... Take 1 tablet by mouth once a day    Acebutolol Hcl 200 Mg Caps (Acebutolol hcl) ..... Once daily  Problem # 3:  DYSAUTONOMIA (ICD-742.8) stable  Problem # 4:  COUMADIN THERAPY (ICD-V58.61) Will need to check her INR. We  can proceed as long as her INR is below about 3. With her antecedent history of a TIA I would like to do it on  Other Orders: TLB-BMP (Basic Metabolic Panel-BMET) (80048-METABOL) TLB-CBC Platelet - w/Differential (85025-CBCD) TLB-PT (Protime) (85610-PTP) TLB-PTT (85730-PTTL) T-2 View CXR (71020TC)  Patient Instructions: 1)  Your physician has recommended that you have a pacemaker generator change.  A pacemaker is a small device that is placed under the skin of your chest or abdomen to help control abnormal heart rhythms. This device uses electrical pulses to prompt the heart to beat at a normal rate. Pacemakers are used to treat heart rhythms that are too slow. Wires (leads) are attached to the pacemaker that goes into the chambers of your heart. This is done in  the hospital and usually requires an overnight stay. Please see the instruction sheet given to you today for more information. 2)  Your physician recommends that you HAVE LABS TODAY: CBC, BMP, PT, PTT 3)  A chest x-ray takes a picture of the organs and structures inside the chest, including the heart, lungs, and blood vessels. This test can show several things, including, whether the heart is enlarged; whether fluid is building up in the lungs; and whether pacemaker / defibrillator leads are still in place.

## 2010-02-24 NOTE — Miscellaneous (Signed)
Summary: Orders Update  Clinical Lists Changes  Orders: Added new Test order of Carotid Duplex (Carotid Duplex) - Signed 

## 2010-02-24 NOTE — Cardiovascular Report (Signed)
Summary: Office Visit   Office Visit   Imported By: Roderic Ovens 02/04/2009 11:59:06  _____________________________________________________________________  External Attachment:    Type:   Image     Comment:   External Document

## 2010-02-24 NOTE — Medication Information (Signed)
Summary: rov/mw  Anticoagulant Therapy  Managed by: Bethena Midget, RN, BSN Referring MD: Sherryl Manges MD PCP: Dr Alroy Dust Supervising MD: Myrtis Ser MD, Tinnie Gens Indication 1: Atrial Fibrillation (ICD-427.31) Indication 2: TIA (ICD-435.9) Lab Used: [facility] Site: [facility] INR POC 2.9 INR RANGE 2.5 - 3.5  Dietary changes: no    Health status changes: no    Bleeding/hemorrhagic complications: no    Recent/future hospitalizations: no    Any changes in medication regimen? no    Recent/future dental: no  Any missed doses?: no       Is patient compliant with meds? yes       Current Medications (verified): 1)  Coumadin 5 Mg  Tabs (Warfarin Sodium) .... Take As Directed By The Coumadin Clinic.Marland KitchenMarland Kitchen 2)  Bayer Low Strength 81 Mg  Tbec (Aspirin) .... Take 1 Tablet By Mouth Once A Day 3)  Acebutolol Hcl 200 Mg  Caps (Acebutolol Hcl) .... Once Daily 4)  Lipitor 20 Mg  Tabs (Atorvastatin Calcium) .... Take 1 Tablet By Mouth Once A Day 5)  Synthroid 75 Mcg  Tabs (Levothyroxine Sodium) .... Take 1 Tab By Mouth Once Daily.Marland KitchenMarland Kitchen 6)  Lorazepam 1 Mg  Tabs (Lorazepam) .... Take 1/2 To 1 Tab By Mouth Up To 3 Times Daily As Directed... 7)  Lexapro 10 Mg Tabs (Escitalopram Oxalate) .... Tale 1 Tab By Mouth Once Daily...  Allergies: 1)  ! * All "qt" Drugs 2)  * Adhesive Tape  Anticoagulation Management History:      The patient is taking warfarin and comes in today for a routine follow up visit.  Positive risk factors for bleeding include history of CVA/TIA.  Negative risk factors for bleeding include an age less than 17 years old.  The bleeding index is 'intermediate risk'.  Positive CHADS2 values include Prior Stroke/CVA/TIA.  Negative CHADS2 values include Age > 43 years old.  The start date was 01/23/1998.  Her last INR was 2.3 ratio.  Anticoagulation responsible Ghada Abbett: Myrtis Ser MD, Tinnie Gens.  INR POC: 2.9.  Cuvette Lot#: [id number].  Exp: 12/2010.    Anticoagulation Management Assessment/Plan:   The patient's current anticoagulation dose is Coumadin 5 mg  tabs: take as directed by the Coumadin Clinic....  The target INR is 2.5-3.5.  The next INR is due 01/19/2010.  Anticoagulation instructions were given to patient.  Results were reviewed/authorized by Bethena Midget, RN, BSN.  She was notified by Bethena Midget, RN, BSN.         Prior Anticoagulation Instructions: INR 2.3  Spoke with pt.  Take 2 1/2 tablets today then resume same dose of of 1 1/2 tablets every day except 2 tablets on Monday and Wednesday.  Recheck INR in 2 weeks.   Current Anticoagulation Instructions: INR 2.9 Continue 7.5mg s everyday except 10mg  on Mondays and Wednesdays. Recheck in 4 weeks.

## 2010-02-24 NOTE — Medication Information (Signed)
Summary: rov/tm  Anticoagulant Therapy  Managed by: Cloyde Reams, RN Referring MD: Sherryl Manges MD PCP: Dr Alroy Dust Supervising MD: Juanda Chance MD, Deryl Ports Indication 1: Atrial Fibrillation (ICD-427.31) Indication 2: TIA (ICD-435.9) Lab Used: [facility] Site: [facility] INR POC 2.0 INR RANGE 2.5 - 3.5  Dietary changes: no    Health status changes: no    Bleeding/hemorrhagic complications: no    Recent/future hospitalizations: no    Any changes in medication regimen? no    Recent/future dental: no  Any missed doses?: no       Is patient compliant with meds? yes       Allergies: 1)  ! * All "qt" Drugs  Anticoagulation Management History:      The patient is taking warfarin and comes in today for a routine follow up visit.  Positive risk factors for bleeding include history of CVA/TIA.  Negative risk factors for bleeding include an age less than 12 years old.  The bleeding index is 'intermediate risk'.  Positive CHADS2 values include Prior Stroke/CVA/TIA.  Negative CHADS2 values include Age > 41 years old.  The start date was 01/23/1998.  Anticoagulation responsible provider: Juanda Chance MD, Smitty Cords.  INR POC: 2.0.  Cuvette Lot#: [id number].  Exp: 04/2010.    Anticoagulation Management Assessment/Plan:      The patient's current anticoagulation dose is Coumadin 5 mg  tabs: take as directed by the Coumadin Clinic....  The target INR is 2.5-3.5.  The next INR is due 03/03/2009.  Anticoagulation instructions were given to patient.  Results were reviewed/authorized by Cloyde Reams, RN.  She was notified by Lew Dawes, PharmD Candidate.         Prior Anticoagulation Instructions: INR 2.3 Today take 12.5mg  (2.5 tablets) then resume 7.5mg s daily. Recheck in 4 weeks.   Current Anticoagulation Instructions: INR 2.0  Take 12.5mg  today (2.5 tablets) then increase dose to 7.5mg  daily except 10mg  on Monday. Recheck in 2 weeks.

## 2010-02-24 NOTE — Cardiovascular Report (Signed)
Summary: Office Visit   Office Visit   Imported By: Roderic Ovens 04/02/2009 11:02:50  _____________________________________________________________________  External Attachment:    Type:   Image     Comment:   External Document

## 2010-02-24 NOTE — Assessment & Plan Note (Signed)
Summary: f79m/dm   Primary Provider:  Dr Alroy Dust   History of Present Illness: Amy Lane is seen in followup for sinus bradycardia status post pacemaker for which she recently underwent generator replacement 3/11.  She also has a history of Ehlers-Danlos syndrome in the context of a marfanoid habitus, mitral valve prolapse status post mitral valve repair at the [facility] clinic with prior TIA on chronic Coumadin as well as syncope due to diagnosis of long QT syndrome. She has been doing beautifully.. There has been no chest pain or shortness of breath and no syncope  Reviewed last duplex s/P left CEA with 0-39% residual on 2/11.  Her ECG today showed atrial pacer spikes on her T wave.  She has a very long underlying PR of about 400 msec but I cant tell if the atrial pacer spike just has a long latency or if there is a sensing problem  Discussed with  Dr Graciela Husbands and will have [employer].  Interogation revealed normal atrial pacing but long latency.  Dr Graciela Husbands indicates observation for now     Current Problems (verified): 1)  Svt/ Psvt/ Pat  (ICD-427.0) 2)  Hx of Bradycardia-tachycardia Syndrome  (ICD-427.81) 3)  Atrial Fibrillation  (ICD-427.31) 4)  Dysautonomia  (ICD-742.8) 5)  Hx of Syncope  (ICD-780.2) 6)  Cardiac Pacemaker Ddd Mdt  (ICD-V45.01) 7)  Coumadin Therapy  (ICD-V58.61) 8)  Allergic Rhinitis  (ICD-477.9) 9)  Mitral Valve Prolapse  (ICD-424.0) 10)  Cerebrovascular Disease  (ICD-437.9) 11)  Hypercholesterolemia  (ICD-272.0) 12)  Hypothyroidism  (ICD-244.9) 13)  Hemorrhoids  (ICD-455.6) 14)  Ehlers-danlos Syndrome  (ICD-756.83) 15)  Fibromyalgia  (ICD-729.1) 16)  Hx of Transient Ischemic Attack  (ICD-435.9) 17)  Depression  (ICD-311) 18)  Carcinoma, Basal Cell  (ICD-173.9)  Current Medications (verified): 1)  Coumadin 5 Mg  Tabs (Warfarin Sodium) .... Take As Directed By The Coumadin Clinic.Marland KitchenMarland Kitchen 2)  Bayer Low Strength 81 Mg  Tbec (Aspirin) .... Take 1 Tablet By  Mouth Once A Day 3)  Acebutolol Hcl 200 Mg  Caps (Acebutolol Hcl) .... Once Daily 4)  Lipitor 20 Mg  Tabs (Atorvastatin Calcium) .... Take 1 Tablet By Mouth Once A Day 5)  Synthroid 75 Mcg  Tabs (Levothyroxine Sodium) .... Take 1 Tab By Mouth Once Daily.Marland KitchenMarland Kitchen 6)  Lorazepam 1 Mg  Tabs (Lorazepam) .... Take 1/2 To 1 Tab By Mouth Up To 3 Times Daily As Directed... 7)  Lexapro 10 Mg Tabs (Escitalopram Oxalate) .... Tale 1 Tab By Mouth Once Daily...  Allergies (verified): 1)  ! * All "qt" Drugs 2)  * Adhesive Tape  Past History:  Past Medical History: Last updated: 04/16/2009  SVT/ PSVT/ PAT (ICD-427.0) Hx of BRADYCARDIA-TACHYCARDIA SYNDROME (ICD-427.81) ATRIAL FIBRILLATION (ICD-427.31) DYSAUTONOMIA (ICD-742.8) Hx of SYNCOPE (ICD-780.2) CARDIAC PACEMAKER DDD MDT (ICD-V45.01)-Medtronic Kappa 901 COUMADIN THERAPY (ICD-V58.61)  ALLERGIC RHINITIS (ICD-477.9) MITRAL VALVE PROLAPSE (ICD-424.0) CEREBROVASCULAR DISEASE (ICD-437.9) HYPERCHOLESTEROLEMIA (ICD-272.0) HYPOTHYROIDISM (ICD-244.9) HEMORRHOIDS (ICD-455.6) EHLERS-DANLOS SYNDROME (ICD-756.83) FIBROMYALGIA (ICD-729.1) Hx of TRANSIENT ISCHEMIC ATTACK (ICD-435.9) DEPRESSION (ICD-311) CARCINOMA, BASAL CELL (ICD-173.9)  Past Surgical History: Last updated: 04/16/2009 S/P complex mitral valve repair at the [facility] S/P left carotid endarterectomy 11/05 by [facility] pacemaker - medtronic kappa 901  Family History: Last updated: 06/11/2008 Family History of Cancer:  Family History of Coronary Artery Disease:   Social History: Last updated: 06/24/2008 smoked socially in her early 20's Married  Alcohol Use - yes No kids  Review of Systems       Denies fever, malais, weight loss,  blurry vision, decreased visual acuity, cough, sputum, SOB, hemoptysis, pleuritic pain, palpitaitons, heartburn, abdominal pain, melena, lower extremity edema, claudication, or rash.   Vital Signs:  Patient profile:   60 year old  female Height:      70 inches Weight:      137 pounds BMI:     19.73 Pulse rate:   137 / minute Resp:     12 per minute BP sitting:   120 / 82  (left arm)  Vitals Entered By: Kem Parkinson (August 10, 2009 9:15 AM)  Physical Exam  General:  Affect appropriate Healthy:  appears stated age HEENT: normal Neck supple with no adenopathy JVP normal bilateral  bruits no thyromegaly Lungs clear with no wheezing and good diaphragmatic motion Heart:  S1/S2 no murmur,rub, gallop or click PMI normal Abdomen: benighn, BS positve, no tenderness, no AAA no bruit.  No HSM or HJR Distal pulses intact with no bruits No edema Neuro non-focal Skin warm and dry    PPM Specifications Following MD:  Sherryl Manges, MD     PPM Vendor:  Medtronic     PPM Model Number:  [id number]     PPM Serial Number:  [id number] PPM DOI:  04/01/2009     PPM Implanting MD:  Sherryl Manges, MD  Lead 1    Location: [facility]     DOI: 11/22/2001     Model #: [id number]     Serial #: [id number]     Status: active Lead 2    Location: [facility]     DOI: 11/28/1996     Model #: [id number]     Serial #: [id number]     Status: active  Magnet Response Rate:  BOL 85 ERI  65  Indications:  Brady  Explantation Comments:  04/01/09 Medtronic Kappa [id number] H explanted  PPM Follow Up Pacer Dependent:  No      Episodes Coumadin:  Yes  Parameters Mode:  DDDR+     Lower Rate Limit:  60     Upper Rate Limit:  130 Paced AV Delay:  150     Sensed AV Delay:  120  Impression & Recommendations:  Problem # 1:  SVT/ PSVT/ PAT (ICD-427.0) Stable S/P ablation Her updated medication list for this problem includes:    Coumadin 5 Mg Tabs (Warfarin sodium) .Marland Kitchen... Take as directed by the coumadin clinic...    Bayer Low Strength 81 Mg Tbec (Aspirin) .Marland Kitchen... Take 1 tablet by mouth once a day    Acebutolol Hcl 200 Mg Caps (Acebutolol hcl) ..... Once daily  Problem # 2:  DYSAUTONOMIA (ICD-742.8) Stable with no syncope or presyncope.  Related to Ehrlos Danhos  Syndrome  Problem # 3:  CARDIAC PACEMAKER DDD MDT (ICD-V45.01) See HPI somewhat unusual ECG with marked atrial latency.  F/U with Dr Graciela Husbands Orders: EKG w/ Interpretation (93000)  Problem # 4:  COUMADIN THERAPY (ICD-V58.61) Rx with no bleeding complicaitons  Problem # 5:  MITRAL VALVE PROLAPSE (ICD-424.0) F/U echo in a year.  No significant MR murmur on exam Her updated medication list for this problem includes:    Acebutolol Hcl 200 Mg Caps (Acebutolol hcl) ..... Once daily  Problem # 6:  CEREBROVASCULAR DISEASE (ICD-437.9) Stable S/P CEA with intimal disease.  F/U duplex in 6 months  Patient Instructions: 1)  Your physician recommends that you schedule a follow-up appointment in: 6 months with Dr. [name] 2)  Your physician recommends that you continue on your current medications as directed. Please refer to the  Current Medication list given to you today.

## 2010-02-24 NOTE — Assessment & Plan Note (Signed)
Summary: 6 months/apc   Primary Care Provider:  Dr Alroy Dust  CC:  6 month ROV & review of mult medical problems....  History of Present Illness: 60 y/o WF here for a follow up visit... she has mult med problems as noted below... she is followed regularly by Drs Azucena Fallen for Cardiology, and Drs Purcell Nails for GI...   ~  December 15, 2008: Pat saw DrMcPhail for routine check 7/10 w/ Dx of "kraurosis vulvae"- prob related to her Ehlers-Danlos syndrome... she is quite upset & concerned & has lost her appetite & lost some weight... she is taking Ensure Tid, but notes depressed and we discussed trial Lexapro for this... she denies pain anywhere, no n/v, no d/c/blood, no discharge etc... she had f/u DrKlein 7/10- pacer fine, no med changes, on Coumadin... she also saw DrNishan 6/10- s/p MVR, PAF & SSS w/ pacer, Carotid dis w/ CAE (no changes made)...   ~  April 16, 2009:  she states that she is feeling "great" & feels the Lexapro is really helping... had dual chamber pacemaker replaced 3/10 by DrKlein (for end-of-life), and she's gained 5# on Ensure supplements... she also saw DrNishan 12/10 for f/u of her Ehlers-Danlos, dysautonomia, SSS w/ pacer, carotid dis w/ tia, & MVP... CDopplers 2/11 stable... she is looking forward to an appt at [facility] in their [facility] for a second opinion regarding her Kraurosis Vulvae... OK TDAP today.   ~  October 13, 2009:  she was seen by Dr. Everlene Other at the [facility] at [facility] for her vulvar dermatitis & dryness & rec to use Crisco shortening Tid... she reports improved over the last 9mo- feeling better, in good spirits w/ the Lexapro helping, etc... she has f/u appts w/ DrKlein 6/11 & Walker Kehr 7/11> both felt she was doing well- noted normal atrial pacing w/ long latency on EKG & they are following for now... due for f/u fasting labs & Flu shot today... mild dermatitis on legs- LidexE written...    Current Problem List:  ALLERGIC RHINITIS  (ICD-477.9) - we discussed Rx w/ Zyrtek, Astepro Prn, etc...  ~  CXR 3/11 showed left pacer, sternal wires, NAD...  MITRAL VALVE PROLAPSE (ICD-424.0) - followed by Walker Kehr...  ~  2DEcho 11/08 showed myxomatous MV, annuloplasty ring, decr post leaflet excursion, norm LVH & wall motion...  ~  NuclearStressTest 11/08 showed mild apical thinning... prev study 12/04 w/o ischemia or infarct & EF=68%...  ~  she saw DrNishan 11/09 for f/u MVP (s/p MV repair), dysautonomia (s/p pacer), & Ehlers-Danlos Syndrome... rec to continue antiplat Rx and Coumadin...  ~  she sees [place] every 6 months- notes reviewed> no changes made.  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 hx AFib/Flutter w/ ablation & pacemaker placed ... she has Ehlers-Danlos synd w/ prolonged QT interval...   CARDIAC PACEMAKER IN SITU (ICD-V45.01) - f/u DrKlein w/ CTChest 4/09- neg.  ~  dual chamber pacer changed 3/11 by DrKlein for end-of-life...  DYSAUTONOMIA (ICD-742.8) - prev on Proamatine 5mg  tabs per DrKlein, but it was stopped...  CEREBROVASCULAR DISEASE (ICD-437.9) - on ASA 81mg /d & COUMADIN (followed in the clinic)... she gets CDoppler's from the vasc lab... she had a Left CAE 11/05 by [facility] for symptomatic left carotid stenosis w/ ulceration...  ~  Carotid angiogram 5/08 by [facility] showed <30% right carotid stenosis  ~  f/u CTA of Head & Neck 12/09 was neg= norm CTA head, & no signif carotid  dis noted in neck.  ~  CDoppler 2/11 showed stable mild carotid dis, left CAE w/ DPA is patent, 0-39% bilat ICA stenoses.  HYPERCHOLESTEROLEMIA (ICD-272.0) - on LIPITOR 20mg /d...  ~  FLP 05/13/07 on Lip20 showed TChol 119, TG 50, HDL 55, LDL 54  ~  FLP 5/10 on Lip20 showed TChol 120, TG 43, HDL 58, LDL 54  ~  FLP 9/11 on Lip20 showed TChol 130, TG 54, HDL 59, LDL 60  HYPOTHYROIDISM (ICD-244.9) - currently on SYNTHROID 57mcg/d...  ~  labs 4/09 on Levoth88 showed TSH = 0.10... rec to  decr Synthroid to 32mcg/d...  ~  labs 9/09 on Levoth75 showed TSH = 1.67  ~  labs 5/10 on Levoth75 showed TSH = 2.92  ~  labs 9/11 on Levoth75 showed TSH= 1.37  HEMORRHOIDS (ICD-455.6) - last colonoscopy 2/09 by DrJacobs showed only sm hems... f/u 60yrs.  GYN - followed by DrMcPhail and Dx w/ kraurosis vulvae 7/10... on PREMARIN VagCream.  ~  11/10: she discussed the Dx w/ me & will inquire about poss hyst/ BSO...  ~  4/11:  eval in the [facility] by Dr Everlene Other showed vulvar dermatitis & dryness w/ rec for "Crisco" Tid!  FIBROMYALGIA (ICD-729.1) - she has been doing Yoga classes and this has helped...  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  Hx of TRANSIENT ISCHEMIC ATTACK (ICD-435.9) - she has seen DrWillis in the past... s/p left carotid endarterectomy 11/05 by [facility].  Hx of SYNCOPE (ICD-780.2) - felt to be neurally mediated, ? related to long QT, no recur since pacer placed...  DEPRESSION (ICD-311) - she takes LORAZEPAM 1mg Qhs for insomnia... she prev saw DrGraves for counselling in [place]...  ~  11/10: poor appetite & some wt loss prob related to depression & we discussed trial LEXAPRO 10mg /d.  ~  3/11: she is very pleased- feels better, gained 5# w/ Ensure, etc...  ~  9/11:  "I feel great"  CARCINOMA, BASAL CELL (ICD-173.9) - skin cancer removed from left leg by [facility] 2008.  Health Maintenance:  ~  GI:  followed by DrJacobs w/ colonoscopy 2/09 showing only sm hems...  ~  GYN:  followed by DrMcPhail, and Dr. Everlene Other at [facility] vulvar clinic...  ~  Immunizations:  she had TDAP 3/11... she gets yearly Flu vaccine in the Fall of the yr...    Preventive Screening-Counseling & Management  Alcohol-Tobacco     Smoking Status: quit     Year Quit: 1970's     Pack years: 1  Allergies: 1)  ! * All "qt" Drugs 2)  * Adhesive Tape  Comments:  Nurse/Medical Assistant: The patient's medications and allergies were reviewed with  the patient and were updated in the Medication and Allergy Lists.  Past History:  Past Medical History: SVT/ PSVT/ PAT (ICD-427.0) Hx of BRADYCARDIA-TACHYCARDIA SYNDROME (ICD-427.81) ATRIAL FIBRILLATION (ICD-427.31) DYSAUTONOMIA (ICD-742.8) Hx of SYNCOPE (ICD-780.2) CARDIAC PACEMAKER DDD MDT (ICD-V45.01)-Medtronic Kappa 901 COUMADIN THERAPY (ICD-V58.61)  ALLERGIC RHINITIS (ICD-477.9) MITRAL VALVE PROLAPSE (ICD-424.0) CEREBROVASCULAR DISEASE (ICD-437.9) HYPERCHOLESTEROLEMIA (ICD-272.0) HYPOTHYROIDISM (ICD-244.9) HEMORRHOIDS (ICD-455.6) EHLERS-DANLOS SYNDROME (ICD-756.83) FIBROMYALGIA (ICD-729.1) Hx of TRANSIENT ISCHEMIC ATTACK (ICD-435.9) DEPRESSION (ICD-311) CARCINOMA, BASAL CELL (ICD-173.9)  Past Surgical History: S/P complex mitral valve repair at the [facility] S/P left carotid endarterectomy 11/05 by [facility] pacemaker - medtronic kappa 901  Family History: Reviewed history from 06/11/2008 and no changes required. Family History of Cancer:  Family History of Coronary Artery Disease:   Social History: Reviewed history from 06/24/2008 and no  changes required. smoked socially in her early 21's Married  Alcohol Use - yes No kids  Review of Systems      See HPI       The patient complains of dyspnea on exertion.  The patient denies anorexia, fever, weight loss, weight gain, vision loss, decreased hearing, hoarseness, chest pain, syncope, peripheral edema, prolonged cough, headaches, hemoptysis, abdominal pain, melena, hematochezia, severe indigestion/heartburn, hematuria, incontinence, muscle weakness, suspicious skin lesions, transient blindness, difficulty walking, depression, unusual weight change, abnormal bleeding, enlarged lymph nodes, and angioedema.    Vital Signs:  Patient profile:   60 year old female Height:      70 inches Weight:      138.38 pounds BMI:     19.93 O2 Sat:      98 % on Room air Temp:     97.0 degrees F oral Pulse rate:   76 /  minute BP sitting:   100 / 62  (left arm) Cuff size:   regular  Vitals Entered By: Randell Loop CMA (October 13, 2009 10:19 AM)  O2 Sat at Rest %:  98 O2 Flow:  Room air CC: 6 month ROV & review of mult medical problems... Is Patient Diabetic? No Pain Assessment Patient in pain? no      Comments no changes in meds today   Physical Exam  Additional Exam:  WD, Thin, 60 y/o WF in NAD... she is 5'11"Tall and 139# = BMI ~20... GENERAL:  Alert & oriented; pleasant & cooperative... HEENT:  [place]/AT, EOM-wnl, PERRLA, EACs-clear, TMs-wnl, NOSE-clear, THROAT-clear & wnl. NECK:  Supple w/ fair ROM; no JVD; normal carotid impulses w/o bruits, left CAE scar; no thyromegaly or nodules palpated;  no lymphadenopathy. CHEST:  Clear to P & A; without wheezes/ rales/ or rhonchi heard... HEART:  Regular Rhythm; pacer on left, without murmurs/ rubs/ or gallops detected... ABDOMEN:  Soft & nontender; normal bowel sounds; no organomegaly or masses palpated... EXT: without deformities or arthritic changes; no varicose veins/ venous insuffic/ or edema. NEURO:  CN's intact;  no focal neuro deficits... DERM:  s/p skin cancer removed from left leg...    MISC. Report  Procedure date:  10/13/2009  Findings:      BMP (METABOL)   Sodium                    141 mEq/L                   135-145   Potassium            [H]  5.3 mEq/L                   3.5-5.1   Chloride                  105 mEq/L                   96-112   Carbon Dioxide            30 mEq/L                    19-32   Glucose                   81 mg/dL                    16-10   BUN  13 mg/dL                    1-61   Creatinine                0.7 mg/dL                   0.9-6.0   Calcium                   9.5 mg/dL                   4.5-40.9   GFR                       87.29 mL/min                >60   Hepatic/Liver Function Panel (HEPATIC)   Total Bilirubin           0.4 mg/dL                   8.1-1.9   Direct  Bilirubin          0.1 mg/dL                   1.4-7.8   Alkaline Phosphatase      57 U/L                      39-117   AST                       27 U/L                      0-37   ALT                       17 U/L                      0-35   Total Protein             6.7 g/dL                    2.9-5.6   Albumin                   3.9 g/dL                    2.1-3.0  CBC Platelet w/Diff (CBCD)   White Cell Count          8.3 K/uL                    4.5-10.5   Red Cell Count            4.11 Mil/uL                 3.87-5.11   Hemoglobin                13.4 g/dL                   86.5-78.4   Hematocrit                38.2 %                      36.0-46.0   MCV  92.8 fl                     78.0-100.0   Platelet Count       [L]  126.0 K/uL                  150.0-400.0   Neutrophil %         [H]  78.6 %                      43.0-77.0   Lymphocyte %         [L]  11.1 %                      12.0-46.0   Monocyte %                8.8 %                       3.0-12.0   Eosinophils%              1.2 %                       0.0-5.0   Basophils %               0.3 %                       0.0-3.0  Comments:      Lipid Panel (LIPID)   Cholesterol               130 mg/dL                   1-610   Triglycerides             54.0 mg/dL                  9.6-045.4   HDL                       09.81 mg/dL                 >19.14   LDL Cholesterol           60 mg/dL                    7-82                 TSH (TSH)   FastTSH                   1.37 uIU/mL                 0.35-5.50  Vitamin D (25-Hydroxy) (95621)  Vitamin D (25-Hydroxy)                             39 ng/mL                    30-89   Impression & Recommendations:  Problem # 1:  CARDIAC>>> Followed by Walker Kehr & DrKlein > on Coumadin via clinic, ASA, Acebutolol, and doing well...  Problem # 2:  CEREBROVASCULAR DISEASE (ICD-437.9) Stable w/o cerebral ischemic symptoms on the ASA, Coumadin...  Problem # 3:   HYPERCHOLESTEROLEMIA (ICD-272.0) FLP looks great on the Lip20... continue same. Her updated  medication list for this problem includes:    Lipitor 20 Mg Tabs (Atorvastatin calcium) .Marland Kitchen... Take 1 tablet by mouth once a day  Orders: TLB-BMP (Basic Metabolic Panel-BMET) (80048-METABOL) TLB-Hepatic/Liver Function Pnl (80076-HEPATIC) TLB-CBC Platelet - w/Differential (85025-CBCD) TLB-Lipid Panel (80061-LIPID) TLB-TSH (Thyroid Stimulating Hormone) (84443-TSH) T-Vitamin D (25-Hydroxy) (86578-46962)  Problem # 4:  HYPOTHYROIDISM (ICD-244.9) TSH is stable on the dose... Her updated medication list for this problem includes:    Synthroid 75 Mcg Tabs (Levothyroxine sodium) .Marland Kitchen... Take 1 tab by mouth once daily...  Problem # 5:  FIBROMYALGIA (ICD-729.1) She notes that she is feeling much better & doing well- grat attitude! Her updated medication list for this problem includes:    Bayer Low Strength 81 Mg Tbec (Aspirin) .Marland Kitchen... Take 1 tablet by mouth once a day  Problem # 6:  DEPRESSION (ICD-311) She is very pleased w/ the Lexapro & wishes to continue... Her updated medication list for this problem includes:    Lorazepam 1 Mg Tabs (Lorazepam) .Marland Kitchen... Take 1/2 to 1 tab by mouth up to 3 times daily as directed...    Lexapro 10 Mg Tabs (Escitalopram oxalate) .Marland Kitchen... Tale 1 tab by mouth once daily...  Problem # 7:  OTHER MEDICAL PROBLEMS AS NOTED>>>  Complete Medication List: 1)  Coumadin 5 Mg Tabs (Warfarin sodium) .... Take as directed by the coumadin clinic.Marland KitchenMarland Kitchen 2)  Bayer Low Strength 81 Mg Tbec (Aspirin) .... Take 1 tablet by mouth once a day 3)  Acebutolol Hcl 200 Mg Caps (Acebutolol hcl) .... Once daily 4)  Lipitor 20 Mg Tabs (Atorvastatin calcium) .... Take 1 tablet by mouth once a day 5)  Synthroid 75 Mcg Tabs (Levothyroxine sodium) .... Take 1 tab by mouth once daily.Marland KitchenMarland Kitchen 6)  Lorazepam 1 Mg Tabs (Lorazepam) .... Take 1/2 to 1 tab by mouth up to 3 times daily as directed... 7)  Lexapro 10 Mg  Tabs (Escitalopram oxalate) .... Tale 1 tab by mouth once daily...  Other Orders: Admin 1st Vaccine (95284) Flu Vaccine 63yrs + [id number])  Patient Instructions: 1)  Today we updated your med list- see below.... 2)  Continue your current meds the same... 3)  Today we did your follow up FASTING blod work... please call the "phone tree" in a few days for your lab results.Marland KitchenMarland Kitchen 4)  We also gave you the 2011 Flu vaccine... 5)  Call for any problems.Marland KitchenMarland Kitchen 6)  Please schedule a follow-up appointment in 6 months.  Flu Vaccine Consent Questions     Do you have a history of severe allergic reactions to this vaccine? no    Any prior history of allergic reactions to egg and/or gelatin? no    Do you have a sensitivity to the preservative Thimersol? no    Do you have a past history of Guillan-Barre Syndrome? no    Do you currently have an acute febrile illness? no    Have you ever had a severe reaction to latex? no    Vaccine information given and explained to patient? yes    Are you currently pregnant? no    Lot Number:[id number]   Exp Date:07/23/2010   Site Given  Right  Deltoid IManagement-CCC] Randell Loop CMA  October 13, 2009 11:03 AM     .lbflu

## 2010-02-24 NOTE — Procedures (Signed)
Summary: Cardiology Device Clinic   Allergies: 1)  ! * All "qt" Drugs 2)  * Adhesive Tape  PPM Specifications Following MD:  Sherryl Manges, MD     PPM Vendor:  Medtronic     PPM Model Number:  [id number]     PPM Serial Number:  [id number] PPM DOI:  04/01/2009     PPM Implanting MD:  Sherryl Manges, MD  Lead 1    Location: RA     DOI: 11/22/2001     Model #: [id number]     Serial #: [id number]     Status: active Lead 2    Location: RV     DOI: 11/28/1996     Model #: [id number]     Serial #: [id number]     Status: active  Magnet Response Rate:  BOL 85 ERI  65  Indications:  Brady  Explantation Comments:  04/01/09 Medtronic Kappa 841/[id number] H explanted  PPM Follow Up Remote Check?  No Battery Voltage:  2.79 V     Battery Est. Longevity:  12 years     Pacer Dependent:  No       PPM Device Measurements Atrium  Amplitude: 2.8 mV, Impedance: 431 ohms, Threshold: 0.5 V at 0.4 msec Right Ventricle  Amplitude: 11.20 mV, Impedance: 640 ohms, Threshold: 0.75 V at 0.4 msec  Episodes MS Episodes:  0     Percent Mode Switch:  0     Coumadin:  Yes Ventricular High Rate:  0     Atrial Pacing:  76.3%     Ventricular Pacing:  0.2%  Parameters Mode:  DDDR+     Lower Rate Limit:  60     Upper Rate Limit:  130 Paced AV Delay:  150     Sensed AV Delay:  120 Tech Comments:  Amy Lane was interrogated today during her carotid scan for an episode last week where she had some dysphagia lasting only minutes.  Her event counters show [id number] single PVC's and 48 PVC runs since last interrogation 08/10/09. Altha Harm, LPN  December 09, 2009 2:37 PM

## 2010-02-24 NOTE — Medication Information (Signed)
Summary: rov/tm  Anticoagulant Therapy  Managed by: Amy Lane, PharmD Referring MD: Amy Manges MD PCP: Dr Amy Lane Supervising MD: Amy Ser MD, Amy Lane Indication 1: Atrial Fibrillation (ICD-427.31) Indication 2: TIA (ICD-435.9) Lab Used: [facility] Site: [facility] INR POC 2.3 INR RANGE 2.5 - 3.5  Dietary changes: yes       Details: May have eaten more greens than usual in the past month.   Health status changes: no    Bleeding/hemorrhagic complications: no    Recent/future hospitalizations: no    Any changes in medication regimen? no    Recent/future dental: no  Any missed doses?: no       Is patient compliant with meds? yes       Allergies: 1)  ! * All "qt" Drugs 2)  * Adhesive Tape  Anticoagulation Management History:      The patient is taking warfarin and comes in today for a routine follow up visit.  Positive risk factors for bleeding include history of CVA/TIA.  Negative risk factors for bleeding include an age less than 60 years old.  The bleeding index is 'intermediate risk'.  Positive CHADS2 values include Prior Stroke/CVA/TIA.  Negative CHADS2 values include Age > 4 years old.  The start date was 01/23/1998.  Her last INR was 2.2 ratio.  Anticoagulation responsible Amy Lane: Amy Ser MD, Amy Lane.  INR POC: 2.3.  Cuvette Lot#: [id number].  Exp: 10/2010.    Anticoagulation Management Assessment/Plan:      The patient's current anticoagulation dose is Coumadin 5 mg  tabs: take as directed by the Coumadin Clinic....  The target INR is 2.5-3.5.  The next INR is due 10/13/2009.  Anticoagulation instructions were given to patient.  Results were reviewed/authorized by Amy Lane, PharmD.  She was notified by Amy Lane PharmD.         Prior Anticoagulation Instructions: INR 2.7 Continue 7.5mg s daily except 10mg s on Mondays and Wednesdays. Recheck in 4 weeks.   Current Anticoagulation Instructions: INR 2.3  Today, Wednesday, August 31st, take Coumadin 2.5 tabs (12.5  mg). Then, continue taking Coumadin 1.5 tabs (7.5 mg) on all days except for Coumadin 2 tabs (10 mg) on Mondays and Wednesdays. Return to clinic in 3 weeks.

## 2010-02-24 NOTE — Medication Information (Signed)
Summary: ccr/jss  Anticoagulant Therapy  Managed by: Cloyde Reams, RN Referring MD: Sherryl Manges MD PCP: Dr Alroy Dust Supervising MD: Myrtis Ser MD, Tinnie Gens Indication 1: Atrial Fibrillation (ICD-427.31) Indication 2: TIA (ICD-435.9) Lab Used: [facility] Site: [facility] INR POC 2.7 INR RANGE 2.5 - 3.5  Dietary changes: no    Health status changes: no    Bleeding/hemorrhagic complications: no    Recent/future hospitalizations: no    Any changes in medication regimen? no    Recent/future dental: no  Any missed doses?: no       Is patient compliant with meds? yes      Comments: Had new pacer put in last month 04/01/09.    Allergies: 1)  ! * All "qt" Drugs 2)  * Adhesive Tape  Anticoagulation Management History:      The patient is taking warfarin and comes in today for a routine follow up visit.  Positive risk factors for bleeding include history of CVA/TIA.  Negative risk factors for bleeding include an age less than 77 years old.  The bleeding index is 'intermediate risk'.  Positive CHADS2 values include Prior Stroke/CVA/TIA.  Negative CHADS2 values include Age > 23 years old.  The start date was 01/23/1998.  Her last INR was 2.2 ratio.  Anticoagulation responsible provider: Myrtis Ser MD, Tinnie Gens.  INR POC: 2.7.  Cuvette Lot#: [id number].  Exp: 06/2010.    Anticoagulation Management Assessment/Plan:      The patient's current anticoagulation dose is Coumadin 5 mg  tabs: take as directed by the Coumadin Clinic....  The target INR is 2.5-3.5.  The next INR is due 06/09/2009.  Anticoagulation instructions were given to patient.  Results were reviewed/authorized by Cloyde Reams, RN.  She was notified by Cloyde Reams RN.         Prior Anticoagulation Instructions: INR 2.2  Take 10mg  today then resume same dosage 7.5mg  daily except 10mg  on Mondays.  Recheck in 3 weeks.    Current Anticoagulation Instructions: INR 2.7  Continue on same dosage 1.5 tablets daily except 2 tablets on  Mondays.  Recheck in 4 weeks.

## 2010-02-24 NOTE — Progress Notes (Signed)
Summary: headache/rash  Phone Note Call from Patient Call back at Home Phone [phone]   Caller: Patient Call For: nadel Reason for Call: Talk to Nurse Summary of Call: Patient calling saying Sunday she developed a headache. she was unable to form sentences because she was unable to make sense of the words.  Also, she developed a rash under arm last Wed. and the rash has since become painful.  Requesting appt w/ SN. Initial call taken by: Lehman Prom,  December 07, 2009 8:57 AM  Follow-up for Phone Call        Pt reports she is scheduled for carotid dopplers and head ct on Thurs for headaches .  (Cardiology ordered this)  Pt now has developed a rash under left arm that is now painful.  Pt is concerned about shingles. Appt todat with Tammy Parrett at 10:30. Abigail Miyamoto RN  December 07, 2009 10:06 AM

## 2010-02-24 NOTE — Medication Information (Signed)
Summary: rov/sp  Anticoagulant Therapy  Managed by: Bethena Midget, RN, BSN Referring MD: Sherryl Manges MD PCP: Dr Alroy Dust Supervising MD: Johney Frame MD, Fayrene Fearing Indication 1: Atrial Fibrillation (ICD-427.31) Indication 2: TIA (ICD-435.9) Lab Used: [facility] Site: [facility] INR POC 2.7 INR RANGE 2.5 - 3.5  Dietary changes: no    Health status changes: no    Bleeding/hemorrhagic complications: no    Recent/future hospitalizations: no    Any changes in medication regimen? no    Recent/future dental: no  Any missed doses?: no       Is patient compliant with meds? yes       Allergies: 1)  ! * All "qt" Drugs 2)  * Adhesive Tape  Anticoagulation Management History:      The patient is taking warfarin and comes in today for a routine follow up visit.  Positive risk factors for bleeding include history of CVA/TIA.  Negative risk factors for bleeding include an age less than 4 years old.  The bleeding index is 'intermediate risk'.  Positive CHADS2 values include Prior Stroke/CVA/TIA.  Negative CHADS2 values include Age > 28 years old.  The start date was 01/23/1998.  Her last INR was 2.2 ratio.  Anticoagulation responsible provider: Allred MD, Fayrene Fearing.  INR POC: 2.7.  Cuvette Lot#: [id number].  Exp: 10/2010.    Anticoagulation Management Assessment/Plan:      The patient's current anticoagulation dose is Coumadin 5 mg  tabs: take as directed by the Coumadin Clinic....  The target INR is 2.5-3.5.  The next INR is due 09/22/2009.  Anticoagulation instructions were given to patient.  Results were reviewed/authorized by Bethena Midget, RN, BSN.  She was notified by Bethena Midget, RN, BSN.         Prior Anticoagulation Instructions: INR 2.7  Continue same regimen of 2 tabs on M, W and 1.5 tabs all other days.    Current Anticoagulation Instructions: INR 2.7 Continue 7.5mg s daily except 10mg s on Mondays and Wednesdays. Recheck in 4 weeks.

## 2010-02-24 NOTE — Miscellaneous (Signed)
Summary: [facility]  Clinical Lists Changes  Observations: Added new observation of CXR RESULTS: Left-sided pacemaker wit two leads overlies normal cardiac silhouette. Sternotomy wires present.  No evidence effusion, infiltrate, pneumothorax.  No acute bony abnormality. Hyperinflated lungs.   IMPRESSION: No acute cardiopulmonary process.   Hyperinflated lungs.   (03/30/2009 9:11)      CXR  Procedure date:  03/30/2009  Findings:      Left-sided pacemaker wit two leads overlies normal cardiac silhouette. Sternotomy wires present.  No evidence effusion, infiltrate, pneumothorax.  No acute bony abnormality. Hyperinflated lungs.   IMPRESSION: No acute cardiopulmonary process.   Hyperinflated lungs.

## 2010-02-24 NOTE — Medication Information (Signed)
Summary: rov/tm  Anticoagulant Therapy  Managed by: Weston Brass, PharmD Referring MD: Sherryl Manges MD PCP: Dr Alroy Dust Supervising MD: Riley Kill MD, Maisie Fus Indication 1: Atrial Fibrillation (ICD-427.31) Indication 2: TIA (ICD-435.9) Lab Used: [facility] Site: [facility] INR POC 3.0 INR RANGE 2.5 - 3.5  Dietary changes: no    Health status changes: no    Bleeding/hemorrhagic complications: no    Recent/future hospitalizations: no    Any changes in medication regimen? no    Recent/future dental: no  Any missed doses?: no       Is patient compliant with meds? yes       Allergies: 1)  ! * All "qt" Drugs 2)  * Adhesive Tape  Anticoagulation Management History:      The patient is taking warfarin and comes in today for a routine follow up visit.  Positive risk factors for bleeding include history of CVA/TIA.  Negative risk factors for bleeding include an age less than 51 years old.  The bleeding index is 'intermediate risk'.  Positive CHADS2 values include Prior Stroke/CVA/TIA.  Negative CHADS2 values include Age > 61 years old.  The start date was 01/23/1998.  Her last INR was 2.3 ratio.  Anticoagulation responsible Breya Cass: Riley Kill MD, Maisie Fus.  INR POC: 3.0.  Cuvette Lot#: [id number].  Exp: 02/2011.    Anticoagulation Management Assessment/Plan:      The patient's current anticoagulation dose is Coumadin 5 mg  tabs: take as directed by the [facility]madin Clinic....  The target INR is 2.5-3.5.  The next INR is due 02/16/2010.  Anticoagulation instructions were given to patient.  Results were reviewed/authorized by Weston Brass, PharmD.  She was notified by Weston Brass PharmD.         Prior Anticoagulation Instructions: INR 2.9 Continue 7.5mg s everyday except 10mg  on Mondays and Wednesdays. Recheck in 4 weeks.   Current Anticoagulation Instructions: INR 3.0  Continue same dose of 1 1/2 tablets every day except 2 tablets on Monday and Wednesday.   Recheck INR in 4 weeks.

## 2010-02-24 NOTE — Medication Information (Signed)
Summary: rov/eh  Anticoagulant Therapy  Managed by: Cloyde Reams, RN Referring MD: Sherryl Manges MD PCP: Dr Alroy Dust Supervising MD: Jens Som MD, Arlys John Indication 1: Atrial Fibrillation (ICD-427.31) Indication 2: TIA (ICD-435.9) Lab Used: [facility] Site: [facility] INR POC 3.1 INR RANGE 2.5 - 3.5  Dietary changes: no    Health status changes: no    Bleeding/hemorrhagic complications: no    Recent/future hospitalizations: no    Any changes in medication regimen? no    Recent/future dental: no  Any missed doses?: no       Is patient compliant with meds? yes       Allergies: 1)  ! * All "qt" Drugs  Anticoagulation Management History:      The patient is taking warfarin and comes in today for a routine follow up visit.  Positive risk factors for bleeding include history of CVA/TIA.  Negative risk factors for bleeding include an age less than 67 years old.  The bleeding index is 'intermediate risk'.  Positive CHADS2 values include Prior Stroke/CVA/TIA.  Negative CHADS2 values include Age > 39 years old.  The start date was 01/23/1998.  Anticoagulation responsible provider: Jens Som MD, Arlys John.  INR POC: 3.1.  Cuvette Lot#: [id number].  Exp: 04/2010.    Anticoagulation Management Assessment/Plan:      The patient's current anticoagulation dose is Coumadin 5 mg  tabs: take as directed by the Coumadin Clinic....  The target INR is 2.5-3.5.  The next INR is due 03/24/2009.  Anticoagulation instructions were given to patient.  Results were reviewed/authorized by Cloyde Reams, RN.  She was notified by Cloyde Reams RN.         Prior Anticoagulation Instructions: INR 2.0  Take 12.5mg  today (2.5 tablets) then increase dose to 7.5mg  daily except 10mg  on Monday. Recheck in 2 weeks.  Current Anticoagulation Instructions: INR 3.1  Continue on same dosage 7.5mg  daily except 10mg  on Mondays.  Recheck in 3 weeks.

## 2010-02-24 NOTE — Assessment & Plan Note (Signed)
Summary: Acute NP office visit - ? shingles   Primary Provider/Referring Provider:  Dr Alroy Dust  CC:  itchy rash/blisters on Wednesday morning that got painful on Sunday - under right arm.  also states has a rash on both feet x8months and fluocinonide cream not helping.Amy Lane  History of Present Illness:  60 yo female with konwn hx of hyperlipidemia, hypothyroidism   ~  December 15, 2008: Pat saw DrMcPhail for routine check 7/10 w/ Dx of "kraurosis vulvae"- prob related to her Ehlers-Danlos syndrome... she is quite upset & concerned & has lost her appetite & lost some weight... she is taking Ensure Tid, but notes depressed and we discussed trial Lexapro for this... she denies pain anywhere, no n/v, no d/c/blood, no discharge etc... she had f/u DrKlein 7/10- pacer fine, no med changes, on Coumadin... she also saw DrNishan 6/10- s/p MVR, PAF & SSS w/ pacer, Carotid dis w/ CAE (no changes made)...   ~  April 16, 2009:  she states that she is feeling "great" & feels the Lexapro is really helping... had dual chamber pacemaker replaced 3/10 by DrKlein (for end-of-life), and she's gained 5# on Ensure supplements... she also saw DrNishan 12/10 for f/u of her Ehlers-Danlos, dysautonomia, SSS w/ pacer, carotid dis w/ tia, & MVP...    December 07, 2009 --Presents for an acute office visit. Complains of itchy rash/blisters 1 week ago then worse 2 days ago.  under right arm.  also states has a rash on both feet x30months, fluocinonide cream not helping. Now using topicort with some help.Has ov with dermatology next week. Pt w/ TIA like symptoms 3 days ago w/ speech changes, decreased vision on right that lasted  ~4m . She has talked with Dr. [name] and has been set up for CT and carotid doppler in 2 days. She has had no further neuro changes and is back to full baseline with no notable deficits. Has hx of of TIAs w/ prev carotid endar. 2005 , on coumadin. INR 2.9  2 weeks ago  Denies chest pain, dyspnea, orthopnea,  hemoptysis, fever, n/v/d, edema, headache,recent travel, ext weakness, dysphagia, visual/speech changes.   Medications Prior to Update: 1)  Coumadin 5 Mg  Tabs (Warfarin Sodium) .... Take As Directed By The [facility] Clinic.Amy KitchenMarland Lane 2)  Bayer Low Strength 81 Mg  Tbec (Aspirin) .... Take 1 Tablet By Mouth Once A Day 3)  Acebutolol Hcl 200 Mg  Caps (Acebutolol Hcl) .... Once Daily 4)  Lipitor 20 Mg  Tabs (Atorvastatin Calcium) .... Take 1 Tablet By Mouth Once A Day 5)  Synthroid 75 Mcg  Tabs (Levothyroxine Sodium) .... Take 1 Tab By Mouth Once Daily.Amy KitchenMarland Lane 6)  Lorazepam 1 Mg  Tabs (Lorazepam) .... Take 1/2 To 1 Tab By Mouth Up To 3 Times Daily As Directed... 7)  Lexapro 10 Mg Tabs (Escitalopram Oxalate) .... Tale 1 Tab By Mouth Once Daily...  Current Medications (verified): 1)  Coumadin 5 Mg  Tabs (Warfarin Sodium) .... Take As Directed By [facility].Amy KitchenMarland Lane 2)  Bayer Low Strength 81 Mg  Tbec (Aspirin) .... Take 1 Tablet By Mouth Once A Day 3)  Acebutolol Hcl 200 Mg  Caps (Acebutolol Hcl) .... Once Daily 4)  Lipitor 20 Mg  Tabs (Atorvastatin Calcium) .... Take 1 Tablet By Mouth Once A Day 5)  Synthroid 75 Mcg  Tabs (Levothyroxine Sodium) .... Take 1 Tab By Mouth Once Daily.Amy KitchenMarland Lane 6)  Lorazepam 1 Mg  Tabs (Lorazepam) .... Take 1/2 To 1 Tab By Mouth Up  To 3 Times Daily As Directed... 7)  Lexapro 10 Mg Tabs (Escitalopram Oxalate) .... Tale 1 Tab By Mouth Once Daily...  Allergies (verified): 1)  ! * All "qt" Drugs 2)  * Adhesive Tape  Past History:  Past Medical History: Last updated: 10/13/2009 SVT/ PSVT/ PAT (ICD-427.0) Hx of BRADYCARDIA-TACHYCARDIA SYNDROME (ICD-427.81) ATRIAL FIBRILLATION (ICD-427.31) DYSAUTONOMIA (ICD-742.8) Hx of SYNCOPE (ICD-780.2) CARDIAC PACEMAKER DDD MDT (ICD-V45.01)-Medtronic Kappa 901 COUMADIN THERAPY (ICD-V58.61)  ALLERGIC RHINITIS (ICD-477.9) MITRAL VALVE PROLAPSE (ICD-424.0) CEREBROVASCULAR DISEASE (ICD-437.9) HYPERCHOLESTEROLEMIA (ICD-272.0) HYPOTHYROIDISM  (ICD-244.9) HEMORRHOIDS (ICD-455.6) EHLERS-DANLOS SYNDROME (ICD-756.83) FIBROMYALGIA (ICD-729.1) Hx of TRANSIENT ISCHEMIC ATTACK (ICD-435.9) DEPRESSION (ICD-311) CARCINOMA, BASAL CELL (ICD-173.9)  Past Surgical History: Last updated: 10/13/2009 S/P complex mitral valve repair at the [facility] S/P left carotid endarterectomy 11/05 by [facility] pacemaker - medtronic kappa 901  Family History: Last updated: 06/11/2008 Family History of Cancer:  Family History of Coronary Artery Disease:   Social History: Last updated: 06/24/2008 smoked socially in her early 20's Married  Alcohol Use - yes No kids  Risk Factors: Smoking Status: quit (10/13/2009)  Review of Systems      See HPI  Vital Signs:  Patient profile:   60 year old female Height:      70 inches Weight:      140.50 pounds BMI:     20.23 O2 Sat:      99 % on Room air Temp:     98.1 degrees F oral Pulse rate:   69 / minute BP sitting:   128 / 80  (left arm) Cuff size:   regular  Vitals Entered By: Boone Master CNA/MA (December 07, 2009 10:52 AM)  O2 Flow:  Room air CC: itchy rash/blisters on Wednesday morning that got painful on Sunday - under right arm.  also states has a rash on both feet x45months, fluocinonide cream not helping. Is Patient Diabetic? No Comments Medications reviewed with patient Daytime contact number verified with patient. Boone Master CNA/MA  December 07, 2009 10:52 AM    Physical Exam  Additional Exam:  WD, Thin, 60 y/o WF in NAD... she is 5'11"Tall and 139# = BMI ~20... GENERAL:  Alert & oriented; pleasant & cooperative... HEENT:  [place]/AT, EOM-wnl, PERRLA, EACs-clear, TMs-wnl, NOSE-clear, THROAT-clear & wnl. NECK:  Supple w/ fair ROM; no JVD; normal carotid impulses w/o bruits, left CAE scar; no thyromegaly or nodules palpated;  no lymphadenopathy. CHEST:  Clear to P & A; without wheezes/ rales/ or rhonchi heard... HEART:  Regular Rhythm; pacer on left, without murmurs/ rubs/  or gallops detected... ABDOMEN:  Soft & nontender; normal bowel sounds; no organomegaly or masses palpated... EXT: without deformities or arthritic changes; no varicose veins/ venous insuffic/ or edema. NEURO:    no focal neuro deficits noted. ...CN2-12 intact, MAEWs w/ nml strength and gait.  DERM:  patch of crusted lesion along right mid -medial axillary region.  few scattered papules along dorsal aspect of feet.     Impression & Recommendations:  Problem # 1:  HERPES ZOSTER (ICD-053.9)    Valtrex 1000mg  three times a day for  7 days   follow up Dermatology as scheduled next week for dermatitis on feet as well.  May continue on topicort two times a day until seen next week.  Please contact office for sooner follow up if symptoms do not improve or worsen  follow up Dr. Kriste Basque as scheduled.   Orders: Est. Patient Level IV (96295)  Problem # 2:  Hx of TRANSIENT ISCHEMIC ATTACK (ICD-435.9)  suppect recent TIA w/ no residual deficits noted.  Plan  follow up Dr. [name] in 2 days for CT  and doppler as scheduled If you develop any symptoms of speech vision changes, weakness  call 911 immediately.  I will send INR to coumadin clinic.     Orders: Est. Patient Level IV (16109)  Medications Added to Medication List This Visit: 1)  Valtrex 1 Gm Tabs (Valacyclovir hcl) .Amy Lane.. 1 by mouth three times a day  Complete Medication List: 1)  Coumadin 5 Mg Tabs (Warfarin sodium) .... Take as directed by the coumadin clinic.Amy KitchenMarland Lane 2)  Bayer Low Strength 81 Mg Tbec (Aspirin) .... Take 1 tablet by mouth once a day 3)  Acebutolol Hcl 200 Mg Caps (Acebutolol hcl) .... Once daily 4)  Lipitor 20 Mg Tabs (Atorvastatin calcium) .... Take 1 tablet by mouth once a day 5)  Synthroid 75 Mcg Tabs (Levothyroxine sodium) .... Take 1 tab by mouth once daily.Amy KitchenMarland Lane 6)  Lorazepam 1 Mg Tabs (Lorazepam) .... Take 1/2 to 1 tab by mouth up to 3 times daily as directed... 7)  Lexapro 10 Mg Tabs (Escitalopram oxalate) ....  Tale 1 tab by mouth once daily.Amy KitchenMarland Lane 8)  Valtrex 1 Gm Tabs (Valacyclovir hcl) .Amy Lane.. 1 by mouth three times a day  Other Orders: TLB-PT (Protime) (85610-PTP)  Patient Instructions: 1)  Saline gel in nose two times a day as needed  2)  Valtrex 1000mg  three times a day for  7 days 3)  follow up Dr. [name] in 2 days for CT  and doppler as scheduled 4)  If you develop any symptoms of speech vision changes, weakness  5)  call 911 immediately.  6)  I will send INR to coumadin clinic.  7)  follow up Dermatology as scheduled next week  8)  May continue on topicort two times a day until seen next week.  9)  Please contact office for sooner follow up if symptoms do not improve or worsen  10)  follow up Dr. Kriste Basque as scheduled.  Prescriptions: VALTREX 1 GM TABS (VALACYCLOVIR HCL) 1 by mouth three times a day  #21 x 0   Entered and Authorized by:   Rubye Oaks NP   Signed by:   Rubye Oaks NP on 12/07/2009   Method used:   Electronically to        [facility]  [address] [phone]* (retail)       [address]       [place], [place]  [address]       Ph: [phone] or [phone]       Fax: [phone]   RxID:   [id number]    Immunization History:  Pneumovax Immunization History:    Pneumovax:  historical (01/24/2003)

## 2010-02-24 NOTE — Procedures (Signed)
Summary: wound check/jml   Current Medications (verified): 1)  Coumadin 5 Mg  Tabs (Warfarin Sodium) .... Take As Directed By The [facility]madin Clinic.Marland KitchenMarland Kitchen 2)  Bayer Low Strength 81 Mg  Tbec (Aspirin) .... Take 1 Tablet By Mouth Once A Day 3)  Acebutolol Hcl 200 Mg  Caps (Acebutolol Hcl) .... Once Daily 4)  Lipitor 20 Mg  Tabs (Atorvastatin Calcium) .... Take 1 Tablet By Mouth Once A Day 5)  Synthroid 75 Mcg  Tabs (Levothyroxine Sodium) .... Take 1 Tab By Mouth Once Daily.Marland KitchenMarland Kitchen 6)  Lorazepam 1 Mg  Tabs (Lorazepam) .... Take 1/2 To 1 Tab By Mouth Up To 3 Times Daily As Directed... 7)  Lexapro 10 Mg Tabs (Escitalopram Oxalate) .... Tale 1 Tab By Mouth Once Daily...  Allergies (verified): 1)  ! * All "qt" Drugs 2)  * Adhesive Tape  PPM Specifications Following MD:  Sherryl Manges, MD     PPM Vendor:  Medtronic     PPM Model Number:  ADDRL1     PPM Serial Number:  [id number] PPM DOI:  04/01/2009     PPM Implanting MD:  Sherryl Manges, MD  Lead 1    Location: RA     DOI: 11/22/2001     Model #: 4098     Serial #: [id number]     Status: active Lead 2    Location: RV     DOI: 11/28/1996     Model #: 1346T     Serial #: [id number]     Status: active  Magnet Response Rate:  BOL 85 ERI  65  Indications:  Brady  Explantation Comments:  04/01/09 Medtronic Kappa [id number] H explanted  PPM Follow Up Remote Check?  No Battery Voltage:  2.79 V     Battery Est. Longevity:  9.5 years     Pacer Dependent:  No       PPM Device Measurements Atrium  Amplitude: 2.8 mV, Impedance: 443 ohms, Threshold: 1.25 V at 0.4 msec Right Ventricle  Amplitude: 11.20 mV, Impedance: 629 ohms, Threshold: 0.75 V at 0.4 msec  Episodes MS Episodes:  0     Percent Mode Switch:  0     Coumadin:  Yes Ventricular High Rate:  0     Atrial Pacing:  81.3%     Ventricular Pacing:  0.2%  Parameters Mode:  DDDR+     Lower Rate Limit:  60     Upper Rate Limit:  130 Paced AV Delay:  150     Sensed AV Delay:  120 Next Cardiology Appt Due:   06/23/2009 Tech Comments:  RA 2.5@0 .4.  Steri strips removed, no redness or edema.  ROV 3 months with Dr. Graciela Husbands. Altha Harm, LPN  April 12, 2009 2:42 PM

## 2010-02-24 NOTE — Medication Information (Signed)
Summary: Coumadin Clinic  Anticoagulant Therapy  Managed by: Weston Brass, PharmD Referring MD: Sherryl Manges MD PCP: Dr Alroy Dust Supervising MD: Excell Seltzer MD, Casimiro Needle Indication 1: Atrial Fibrillation (ICD-427.31) Indication 2: TIA (ICD-435.9) Lab Used: [facility] Site: [facility] INR POC 2.3 INR RANGE 2.5 - 3.5  Dietary changes: no    Health status changes: yes       Details: had some TIAs over weekend; plan to do scan of carotids tomorrow.   Bleeding/hemorrhagic complications: no    Recent/future hospitalizations: no    Any changes in medication regimen? yes       Details: valtrex x 7 days for shingles  Recent/future dental: no  Any missed doses?: yes     Details: missed taking 2 tablets last week on Monday because out of medication; only took 1/2 tablet   Is patient compliant with meds? yes       Allergies: 1)  ! * All "qt" Drugs 2)  * Adhesive Tape  Anticoagulation Management History:      Her anticoagulation is being managed by telephone today.  Positive risk factors for bleeding include history of CVA/TIA.  Negative risk factors for bleeding include an age less than 85 years old.  The bleeding index is 'intermediate risk'.  Positive CHADS2 values include Prior Stroke/CVA/TIA.  Negative CHADS2 values include Age > 29 years old.  The start date was 01/23/1998.  Her last INR was 2.3 ratio.  Anticoagulation responsible provider: Excell Seltzer MD, Casimiro Needle.  INR POC: 2.3.  Exp: 11/2010.    Anticoagulation Management Assessment/Plan:      The patient's current anticoagulation dose is Coumadin 5 mg  tabs: take as directed by the Coumadin Clinic....  The target INR is 2.5-3.5.  The next INR is due 12/21/2009.  Anticoagulation instructions were given to patient.  Results were reviewed/authorized by Weston Brass, PharmD.  She was notified by Weston Brass PharmD.         Prior Anticoagulation Instructions: INR 2.9  Continue taking 2 tablets on monday and wednesday. And take 1.5 tablets all  other days. Recheck in 4 weeks.   Current Anticoagulation Instructions: INR 2.3  Spoke with pt.  Take 2 1/2 tablets today then resume same dose of of 1 1/2 tablets every day except 2 tablets on Monday and Wednesday.  Recheck INR in 2 weeks.

## 2010-02-24 NOTE — Cardiovascular Report (Signed)
Summary: Pre Op Orders  Pre Op Orders   Imported By: Roderic Ovens 04/09/2009 15:19:03  _____________________________________________________________________  External Attachment:    Type:   Image     Comment:   External Document

## 2010-02-24 NOTE — Letter (Signed)
Summary: OB-GYN/[facility]  [facility]   Imported By: Sherian Rein 05/18/2009 08:44:54  _____________________________________________________________________  External Attachment:    Type:   Image     Comment:   External Document

## 2010-02-24 NOTE — Procedures (Signed)
Summary: [facility]iology Device [facility]   Allergies: 1)  ! * All "qt" Drugs 2)  * Adhesive Tape  PPM Specifications Following MD:  Sherryl Manges, MD     PPM Vendor:  Medtronic     PPM Model Number:  ADDRL1     PPM Serial Number:  [id number] PPM DOI:  04/01/2009     PPM Implanting MD:  Sherryl Manges, MD  Lead 1    Location: RA     DOI: 11/22/2001     Model #: 8469     Serial #: [id number]     Status: active Lead 2    Location: RV     DOI: 11/28/1996     Model #: 1346T     Serial #: [id number]     Status: active  Magnet Response Rate:  BOL 85 ERI  65  Indications:  Brady  Explantation Comments:  04/01/09 Medtronic Kappa [id number] H explanted  PPM Follow Up Remote Check?  No Battery Voltage:  2.79 V     Battery Est. Longevity:  12 years     Pacer Dependent:  No       PPM Device Measurements Atrium  Amplitude: 2.8 mV, Impedance: 414 ohms, Threshold: 0.5 V at 0.4 msec Right Ventricle  Amplitude: 8.0 mV, Impedance: 631 ohms, Threshold: 0.75 V at 0.4 msec  Episodes MS Episodes:  0     Percent Mode Switch:  0     Coumadin:  Yes Ventricular High Rate:  0     Atrial Pacing:  75.1%     Ventricular Pacing:  0.2%  Parameters Mode:  DDDR+     Lower Rate Limit:  60     Upper Rate Limit:  130 Paced AV Delay:  150     Sensed AV Delay:  120 Next Remote Date:  05/19/2010     Next Cardiology Appt Due:  06/24/2010 Tech Comments:  No parameter changes.  Device function normal.  4883 single PVC's since 12/09/09.  Carelink transmissions every 3months. ROV 6/12 with Dr. Graciela Husbands. Altha Harm, LPN  February 16, 2010 10:29 AM

## 2010-02-24 NOTE — Assessment & Plan Note (Signed)
Summary: F6M/DFG   Primary Provider:  Dr Alroy Dust  CC:  check up.  History of Present Illness: Amy Lane is seen today for F/U of carotid disease, TIA, dysautonomia, pacer and MVP.  She has had issues with poor memory, ? TIA;s with confusion and difficulty speaking.  Carotids have been stable with previous CEA on left.  CT negative and cannot have MRI due to pacer.  Sees Dr Anne Hahn tomorrow.  Discussed stopping lipitor for a while given recent reports of global amnesia and MS changes with statins.  No other associated symptoms to suggest complex migraine.  Rhythm has been stable Pacer interogated today by Gunnar Fusi.  Reviewed and parameters are normal.  Less PVC's compared to previous Will have telephone transmission in May before trip to [place].  No SSCP, dyspnea, edema, or palpitatoins.  Compliant with meds.  INR has been Rx during episodes.  Active working at [employer] in [employer]  Current Problems (verified): 1)  Herpes Zoster  (ICD-053.9) 2)  Svt/ Psvt/ Pat  (ICD-427.0) 3)  Hx of Bradycardia-tachycardia Syndrome  (ICD-427.81) 4)  Atrial Fibrillation  (ICD-427.31) 5)  Dysautonomia  (ICD-742.8) 6)  Hx of Syncope  (ICD-780.2) 7)  Cardiac Pacemaker Ddd Mdt  (ICD-V45.01) 8)  Coumadin Therapy  (ICD-V58.61) 9)  Allergic Rhinitis  (ICD-477.9) 10)  Mitral Valve Prolapse  (ICD-424.0) 11)  Cerebrovascular Disease  (ICD-437.9) 12)  Hypercholesterolemia  (ICD-272.0) 13)  Hypothyroidism  (ICD-244.9) 14)  Hemorrhoids  (ICD-455.6) 15)  Ehlers-danlos Syndrome  (ICD-756.83) 16)  Fibromyalgia  (ICD-729.1) 17)  Hx of Transient Ischemic Attack  (ICD-435.9) 18)  Depression  (ICD-311) 19)  Carcinoma, Basal Cell  (ICD-173.9)  Current Medications (verified): 1)  Coumadin 5 Mg  Tabs (Warfarin Sodium) .... Take As Directed By [facility].Marland KitchenMarland Kitchen 2)  Bayer Low Strength 81 Mg  Tbec (Aspirin) .... Take 1 Tablet By Mouth Once A Day 3)  Acebutolol Hcl 200 Mg  Caps (Acebutolol Hcl) .... Once Daily 4)   Lipitor 20 Mg  Tabs (Atorvastatin Calcium) .... Take 1 Tablet By Mouth Once A Day 5)  Synthroid 75 Mcg  Tabs (Levothyroxine Sodium) .... Take 1 Tab By Mouth Once Daily.Marland KitchenMarland Kitchen 6)  Lorazepam 1 Mg  Tabs (Lorazepam) .... Take 1/2 To 1 Tab By Mouth Up To 3 Times Daily As Directed... 7)  Lexapro 10 Mg Tabs (Escitalopram Oxalate) .Marland Kitchen.. 1 Tab Every Other Day  Allergies (verified): 1)  ! * All "qt" Drugs 2)  * Adhesive Tape  Past History:  Past Medical History: Last updated: 10/13/2009 SVT/ PSVT/ PAT (ICD-427.0) Hx of BRADYCARDIA-TACHYCARDIA SYNDROME (ICD-427.81) ATRIAL FIBRILLATION (ICD-427.31) DYSAUTONOMIA (ICD-742.8) Hx of SYNCOPE (ICD-780.2) CARDIAC PACEMAKER DDD MDT (ICD-V45.01)-Medtronic Kappa 901 COUMADIN THERAPY (ICD-V58.61)  ALLERGIC RHINITIS (ICD-477.9) MITRAL VALVE PROLAPSE (ICD-424.0) CEREBROVASCULAR DISEASE (ICD-437.9) HYPERCHOLESTEROLEMIA (ICD-272.0) HYPOTHYROIDISM (ICD-244.9) HEMORRHOIDS (ICD-455.6) EHLERS-DANLOS SYNDROME (ICD-756.83) FIBROMYALGIA (ICD-729.1) Hx of TRANSIENT ISCHEMIC ATTACK (ICD-435.9) DEPRESSION (ICD-311) CARCINOMA, BASAL CELL (ICD-173.9)  Past Surgical History: Last updated: 10/13/2009 S/P complex mitral valve repair at the [facility] S/P left carotid endarterectomy 11/05 by [facility] pacemaker - medtronic kappa 901  Family History: Last updated: 06/11/2008 Family History of Cancer:  Family History of Coronary Artery Disease:   Social History: Last updated: 06/24/2008 smoked socially in her early 20's Married  Alcohol Use - yes No kids  Review of Systems       Denies fever, malais, weight loss, blurry vision, decreased visual acuity, cough, sputum, SOB, hemoptysis, pleuritic pain, palpitaitons, heartburn, abdominal pain, melena, lower extremity edema, claudication, or rash.  Vital Signs:  Patient profile:   60 year old female Height:      70 inches Weight:      141 pounds BMI:     20.30 Pulse rate:   83 / minute Resp:     14 per  minute BP sitting:   114 / 69  (left arm)  Vitals Entered By: Kem Parkinson (February 16, 2010 10:05 AM)  Physical Exam  General:  Affect appropriate Healthy:  appears stated age HEENT: normal Neck supple with no adenopathy JVP normal no bruits no thyromegaly Lungs clear with no wheezing and good diaphragmatic motion Heart:  S1/S2 no murmur,rub, gallop or click PMI normal Abdomen: benighn, BS positve, no tenderness, no AAA no bruit.  No HSM or HJR Distal pulses intact with no bruits No edema Neuro non-focal Skin warm and dry Left CEA Pacer under left clavicle   PPM Specifications Following MD:  Sherryl Manges, MD     PPM Vendor:  Medtronic     PPM Model Number:  ADDRL1     PPM Serial Number:  [id number] PPM DOI:  04/01/2009     PPM Implanting MD:  Sherryl Manges, MD  Lead 1    Location: RA     DOI: 11/22/2001     Model #: [id number]     Serial #: [id number]     Status: active Lead 2    Location: RV     DOI: 11/28/1996     Model #: [id number]     Serial #: [id number]     Status: active  Magnet Response Rate:  BOL 85 ERI  65  Indications:  Brady  Explantation Comments:  04/01/09 Medtronic Kappa [id number] H explanted  PPM Follow Up Pacer Dependent:  No      Episodes Coumadin:  Yes  Parameters Mode:  DDDR+     Lower Rate Limit:  60     Upper Rate Limit:  130 Paced AV Delay:  150     Sensed AV Delay:  120  Impression & Recommendations:  Problem # 1:  ATRIAL FIBRILLATION (ICD-427.31) In NSR on Rx coumadin Conintue BB and baby asa Her updated medication list for this problem includes:    Coumadin 5 Mg Tabs (Warfarin sodium) .Marland Kitchen... Take as directed by the coumadin clinic...    Bayer Low Strength 81 Mg Tbec (Aspirin) .Marland Kitchen... Take 1 tablet by mouth once a day    Acebutolol Hcl 200 Mg Caps (Acebutolol hcl) ..... Once daily  Orders: EKG w/ Interpretation (93000)  Problem # 2:  DYSAUTONOMIA (ICD-742.8) Stable with no orthostasis.  ? Related to [employer].  Has had recent retinal  issues and is seeing an eye doctor for this  Problem # 3:  CARDIAC PACEMAKER DDD MDT (ICD-V45.01) Normal function by interogation reviewed today.  f/U phone transmission April  Problem # 4:  MITRAL VALVE PROLAPSE (ICD-424.0) Stable no murmur on exam.   Her updated medication list for this problem includes:    Acebutolol Hcl 200 Mg Caps (Acebutolol hcl) ..... Once daily  Problem # 5:  CEREBROVASCULAR DISEASE (ICD-437.9) Neurologic symptoms are transient and not likely vascular given w/u and not likely embolic with Rx coumadin F/U Dr Anne Hahn.  Stop Lipitor for 8 weeks to see if this helps  Patient Instructions: 1)  Your physician recommends that you schedule a follow-up appointment in: END OF APRIL WITH DR [name] 2)  Your physician recommends that you continue on your current medications as directed. Please refer to the Current Medication  list given to you today.   EKG Report  Procedure date:  02/16/2010  Findings:      NSR RBBB PR 230 msec

## 2010-02-24 NOTE — Letter (Signed)
Summary: Appointment - Missed  [facility], Main Office  [address]   [place], [place] [address]   Phone: [phone]  Fax: [phone]     January 07, 2010 MRN: [id number]   Amy Lane [address] [place], [place]  [address]   Dear Ms. Mariann Laster,  Our records indicate you missed your appointment on 12.7-11 with the [facility]. It is very important that we reach you to reschedule this appointment. We look forward to participating in your health care needs. Please contact us at the number listed above at your earliest convenience to reschedule this appointment.     Sincerely,    Glass blower/designer

## 2010-02-24 NOTE — Medication Information (Signed)
Summary: rov/ewj  Anticoagulant Therapy  Managed by: Bethena Midget, RN, BSN Referring MD: Sherryl Manges MD PCP: Dr Alroy Dust Supervising MD: Gala Romney MD, Reuel Boom Indication 1: Atrial Fibrillation (ICD-427.31) Indication 2: TIA (ICD-435.9) Lab Used: [facility]  Site: [facility] INR POC 2.1 INR RANGE 2.5 - 3.5  Dietary changes: no    Health status changes: no    Bleeding/hemorrhagic complications: no    Recent/future hospitalizations: no    Any changes in medication regimen? no    Recent/future dental: no  Any missed doses?: yes     Details: Missed a dose on last Friday  Is patient compliant with meds? yes       Allergies: 1)  ! * All "qt" Drugs 2)  * Adhesive Tape  Anticoagulation Management History:      The patient is taking warfarin and comes in today for a routine follow up visit.  Positive risk factors for bleeding include history of CVA/TIA.  Negative risk factors for bleeding include an age less than 11 years old.  The bleeding index is 'intermediate risk'.  Positive CHADS2 values include Prior Stroke/CVA/TIA.  Negative CHADS2 values include Age > 12 years old.  The start date was 01/23/1998.  Her last INR was 2.2 ratio.  Anticoagulation responsible provider: Bensimhon MD, Reuel Boom.  INR POC: 2.1.  Cuvette Lot#: [id number].  Exp: 08/2010.    Anticoagulation Management Assessment/Plan:      The patient's current anticoagulation dose is Coumadin 5 mg  tabs: take as directed by the Coumadin Clinic....  The target INR is 2.5-3.5.  The next INR is due 07/07/2009.  Anticoagulation instructions were given to patient.  Results were reviewed/authorized by Bethena Midget, RN, BSN.  She was notified by Bethena Midget, RN, BSN.         Prior Anticoagulation Instructions: INR 2.7  Continue on same dosage 1.5 tablets daily except 2 tablets on Mondays.  Recheck in 4 weeks.    Current Anticoagulation Instructions: INR 2.1 Today take 10mg  then resume 7.5mg  daily except 10mg  on Mondays.  Recheck in 4 weeks.

## 2010-02-24 NOTE — Progress Notes (Signed)
Summary: c/o ha on yesterday could form sentences / words  Phone Note Call from Patient Call back at Home Phone [phone]   Caller: Patient  c [phone] Reason for Call: Talk to Nurse Complaint: Headache, Abdominal Pain Summary of Call: per pt calling, sudden ha on yesterday, couldn't  form a sentences or word. last 5 min . then felt tired.  Initial call taken by: Lorne Skeens,  December 06, 2009 9:03 AM  Follow-up for Phone Call        spoke with pt, she reports that suddenly yesterday while shopping she developed a headache. she was unable to form sentences because she was unable to make sense of the words. she also had flashing in the right eye.  the event lasted less than 5 min and after she just felt tired. today she feels fine. she has not had an event like this in several years. will foward for dr [name] review Deliah Goody, RN  December 06, 2009 9:51 AM   Additional Follow-up for Phone Call Additional follow up Details #1::        Should F/U with neuro.  Get a carotid duplex this week and head CT.  Cant have MRI due to pacer  pt returned call.pls call [phone] Glynda Jaeger  December 06, 2009 1:39 PM Additional Follow-up by: Colon Branch, MD, [facility],  December 06, 2009 10:05 AM    Additional Follow-up for Phone Call Additional follow up Details #2::    spoke with pt, she will have carotid dopplers on thursday 12-08-09 @ 1pm and a CT scan of the head 12-08-09 @ 11:30am. she will call if any other problems Deliah Goody, RN  December 06, 2009 2:26 PM

## 2010-02-24 NOTE — Medication Information (Signed)
Summary: rov/tm  Anticoagulant Therapy  Managed by: Leota Sauers, PharmD, BCPS, CPP Referring MD: Sherryl Manges MD PCP: Dr Alroy Dust Supervising MD: Antoine Poche MD, Fayrene Fearing Indication 1: Atrial Fibrillation (ICD-427.31) Indication 2: TIA (ICD-435.9) Lab Used: [facility] Site: [facility] INR POC 3.0 INR RANGE 2.5 - 3.5  Dietary changes: no    Health status changes: yes     Recent/future hospitalizations: no    Any changes in medication regimen? no    Recent/future dental: no  Any missed doses?: no       Is patient compliant with meds? yes      Comments: slurred speech and dysphagia about 2:30 this afternoon resolved after a few minutes - will be seeing MD today  Current Medications (verified): 1)  Coumadin 5 Mg  Tabs (Warfarin Sodium) .... Take As Directed By [facility].Marland KitchenMarland Kitchen 2)  Bayer Low Strength 81 Mg  Tbec (Aspirin) .... Take 1 Tablet By Mouth Once A Day 3)  Acebutolol Hcl 200 Mg  Caps (Acebutolol Hcl) .... Once Daily 4)  Lipitor 20 Mg  Tabs (Atorvastatin Calcium) .... Take 1 Tablet By Mouth Once A Day 5)  Synthroid 75 Mcg  Tabs (Levothyroxine Sodium) .... Take 1 Tab By Mouth Once Daily.Marland KitchenMarland Kitchen 6)  Lorazepam 1 Mg  Tabs (Lorazepam) .... Take 1/2 To 1 Tab By Mouth Up To 3 Times Daily As Directed... 7)  Lexapro 10 Mg Tabs (Escitalopram Oxalate) .... Tale 1 Tab By Mouth Once Daily...  Allergies: 1)  ! * All "qt" Drugs 2)  * Adhesive Tape  Anticoagulation Management History:      The patient is taking warfarin and comes in today for a routine follow up visit.  Positive risk factors for bleeding include history of CVA/TIA.  Negative risk factors for bleeding include an age less than 65 years old.  The bleeding index is 'intermediate risk'.  Positive CHADS2 values include Prior Stroke/CVA/TIA.  Negative CHADS2 values include Age > 66 years old.  The start date was 01/23/1998.  Her last INR was 2.3 ratio.  Anticoagulation responsible provider: Antoine Poche MD, Fayrene Fearing.  INR POC: 3.0.   Cuvette Lot#: [id number].  Exp: 02/2011.    Anticoagulation Management Assessment/Plan:      The patient's current anticoagulation dose is Coumadin 5 mg  tabs: take as directed by the Coumadin Clinic....  The target INR is 2.5-3.5.  The next INR is due 02/16/2010.  Anticoagulation instructions were given to patient.  Results were reviewed/authorized by Leota Sauers, PharmD, BCPS, CPP.         Prior Anticoagulation Instructions: INR 3.0  Continue same dose of 1 1/2 tablets every day except 2 tablets on Monday and Wednesday.   Recheck INR in 4 weeks.   Current Anticoagulation Instructions: INR 3.0  Coumadin 5mg  tabs - 2 tabs on MON and WED, 1.5 tabs all other days

## 2010-02-24 NOTE — Progress Notes (Signed)
Summary: **LMOM** Labs and Doppler Results  Phone Note Outgoing Call   Call placed by: Duncan Dull, RN, BSN,  March 31, 2009 5:02 PM Call placed to: Patient Summary of Call: Called patient and left message on machine  To discuss labs and Carotid Doppler Study  Duncan Dull, RN, BSN  March 31, 2009 5:03 PM   Follow-up for Phone Call        husband notified of results. Gypsy Balsam RN BSN  April 22, 2009 8:43 AM

## 2010-02-24 NOTE — Assessment & Plan Note (Signed)
Summary: DEVICE/SAF   Primary Provider:  Dr Alroy Dust  CC:  device.  History of Present Illness: Amy Lane is seen in followup for sinus bradycardia status post pacemaker for which she recently underwent generator replacement.   She also has a history of Ehlers-Danlos syndrome in the context of a marfanoid habitus, mitral valve prolapse status post mitral valve repair at the [facility] clinic with prior TIA on chronic Coumadin as well as syncope due to diagnosis of long QT syndrome.  She has been doing beautifully. She had a little bit of lightheadedness last Saturday bustling about in the heat,  but otherwise had no complaints. There has been no chest pain or shortness of breath and no syncope  Current Medications (verified): 1)  Coumadin 5 Mg  Tabs (Warfarin Sodium) .... Take As Directed By The [facility].Marland KitchenMarland Kitchen 2)  Bayer Low Strength 81 Mg  Tbec (Aspirin) .... Take 1 Tablet By Mouth Once A Day 3)  Acebutolol Hcl 200 Mg  Caps (Acebutolol Hcl) .... Once Daily 4)  Lipitor 20 Mg  Tabs (Atorvastatin Calcium) .... Take 1 Tablet By Mouth Once A Day 5)  Synthroid 75 Mcg  Tabs (Levothyroxine Sodium) .... Take 1 Tab By Mouth Once Daily.Marland KitchenMarland Kitchen 6)  Lorazepam 1 Mg  Tabs (Lorazepam) .... Take 1/2 To 1 Tab By Mouth Up To 3 Times Daily As Directed... 7)  Lexapro 10 Mg Tabs (Escitalopram Oxalate) .... Tale 1 Tab By Mouth Once Daily...  Allergies (verified): 1)  ! * All "qt" Drugs 2)  * Adhesive Tape  Past History:  Past Medical History: Last updated: 04/16/2009  SVT/ PSVT/ PAT (ICD-427.0) Hx of BRADYCARDIA-TACHYCARDIA SYNDROME (ICD-427.81) ATRIAL FIBRILLATION (ICD-427.31) DYSAUTONOMIA (ICD-742.8) Hx of SYNCOPE (ICD-780.2) CARDIAC PACEMAKER DDD MDT (ICD-V45.01)-Medtronic Kappa 901 COUMADIN THERAPY (ICD-V58.61)  ALLERGIC RHINITIS (ICD-477.9) MITRAL VALVE PROLAPSE (ICD-424.0) CEREBROVASCULAR DISEASE (ICD-437.9) HYPERCHOLESTEROLEMIA (ICD-272.0) HYPOTHYROIDISM (ICD-244.9) HEMORRHOIDS  (ICD-455.6) EHLERS-DANLOS SYNDROME (ICD-756.83) FIBROMYALGIA (ICD-729.1) Hx of TRANSIENT ISCHEMIC ATTACK (ICD-435.9) DEPRESSION (ICD-311) CARCINOMA, BASAL CELL (ICD-173.9)  Past Surgical History: Last updated: 04/16/2009 S/P complex mitral valve repair at the [facility] S/P left carotid endarterectomy 11/05 by [facility] pacemaker - medtronic kappa 901  Family History: Last updated: 06/11/2008 Family History of Cancer:  Family History of Coronary Artery Disease:   Social History: Last updated: 06/24/2008 smoked socially in her early 35's Married  Alcohol Use - yes No kids  Vital Signs:  Patient profile:   60 year old female Height:      70 inches Weight:      134 pounds BMI:     19.30 Pulse rate:   70 / minute Pulse rhythm:   regular BP sitting:   108 / 70  (right arm) Cuff size:   regular  Vitals Entered By: Judithe Modest CMA (July 13, 2009 9:16 AM)  Physical Exam  General:  Well developed, well nourished, in no acute distress with marfanoid habitus Head:  normal HEENT Neck:  supple without Chest Wall:  device pocket well healed Lungs:  clear to auscultation Heart:  regular rate and rhythm without murmurs or gallops Abdomen:  soft nontender Msk:  marfanoid habitus Extremities:  without clubbing cyanosis or edema Neurologic:  alert and oriented and grossly Skin:  warm and dry Psych:  engaging affect   PPM Specifications Following MD:  Sherryl Manges, MD     PPM Vendor:  Medtronic     PPM Model Number:  [id number]     PPM Serial Number:  [id number] PPM DOI:  04/01/2009  PPM Implanting MD:  Sherryl Manges, MD  Lead 1    Location: RA     DOI: 11/22/2001     Model #: [id number]     Serial #: [id number]     Status: active Lead 2    Location: RV     DOI: 11/28/1996     Model #: [id number]     Serial #: [id number]     Status: active  Magnet Response Rate:  BOL 85 ERI  65  Indications:  Brady  Explantation Comments:  04/01/09 Medtronic Kappa [id number] H explanted  PPM  Follow Up Battery Voltage:  2.80 V     Battery Est. Longevity:  12 yrs     Pacer Dependent:  No       PPM Device Measurements Atrium  Amplitude: 4.00 mV, Impedance: 436 ohms, Threshold: 0.50 V at 0.40 msec Right Ventricle  Amplitude: 15.68 mV, Impedance: 627 ohms, Threshold: 1.00 V at 0.40 msec  Episodes MS Episodes:  0     Percent Mode Switch:  0     Coumadin:  Yes Ventricular High Rate:  0     Atrial Pacing:  74.7%     Ventricular Pacing:  0.2%  Parameters Mode:  DDDR+     Lower Rate Limit:  60     Upper Rate Limit:  130 Paced AV Delay:  150     Sensed AV Delay:  120 Next Cardiology Appt Due:  12/23/2009 Tech Comments:  NORMAL DEVICE FUNCTION.  NO EPISODES.  CHANGED RA AMPLITUDE FROM 2.5 TO 2.0 V.  ROV IN  6 [facility]. Amy Lane  July 13, 2009 9:34 AM  Impression & Recommendations:  Problem # 1:  SVT/ PSVT/ PAT (ICD-427.0) no recurrent SVT as assessed by her device  Problem # 2:    BRADYCARDIA-TACHYCARDIA SYNDROME (ICD-427.81) stable she is 75% atrially paced  Problem # 3:  ATRIAL FIBRILLATION (ICD-427.31) no intercurrent atrial fibrillation. She is on Coumadin and aspirin. Will need to explore the indication for aspirin Her updated medication list for this problem includes:    Coumadin 5 Mg Tabs (Warfarin sodium) .Marland Kitchen... Take as directed by the coumadin clinic...    Bayer Low Strength 81 Mg Tbec (Aspirin) .Marland Kitchen... Take 1 tablet by mouth once a day    Acebutolol Hcl 200 Mg Caps (Acebutolol hcl) ..... Once daily  Problem # 4:  DYSAUTONOMIA (ICD-742.8) stable. She is quite aware of modifications necessary to deal with the heat.  Patient Instructions: 1)  Your physician wants you to follow-up in:  March 2012 with Dr Graciela Husbands. You will receive a reminder letter in the mail two months in advance. If you don't receive a letter, please call our office to schedule the follow-up appointment.

## 2010-02-24 NOTE — Medication Information (Signed)
Summary: rov/tm  Anticoagulant Therapy  Managed by: Cloyde Reams, RN, BSN Referring MD: Sherryl Manges MD PCP: Dr Alroy Dust Supervising MD: Myrtis Ser MD, Tinnie Gens Indication 1: Atrial Fibrillation (ICD-427.31) Indication 2: TIA (ICD-435.9) Lab Used: [facility] Site: [facility] INR POC 3.5 INR RANGE 2.5 - 3.5  Dietary changes: no    Health status changes: no    Bleeding/hemorrhagic complications: no    Recent/future hospitalizations: no    Any changes in medication regimen? no    Recent/future dental: no  Any missed doses?: no       Is patient compliant with meds? yes       Allergies: 1)  ! * All "qt" Drugs 2)  * Adhesive Tape  Anticoagulation Management History:      The patient is taking warfarin and comes in today for a routine follow up visit.  Positive risk factors for bleeding include history of CVA/TIA.  Negative risk factors for bleeding include an age less than 52 years old.  The bleeding index is 'intermediate risk'.  Positive CHADS2 values include Prior Stroke/CVA/TIA.  Negative CHADS2 values include Age > 16 years old.  The start date was 01/23/1998.  Her last INR was 2.2 ratio.  Anticoagulation responsible provider: Myrtis Ser MD, Tinnie Gens.  INR POC: 3.5.  Cuvette Lot#: [id number].  Exp: 11/2010.    Anticoagulation Management Assessment/Plan:      The patient's current anticoagulation dose is Coumadin 5 mg  tabs: take as directed by the Coumadin Clinic....  The target INR is 2.5-3.5.  The next INR is due 11/23/2009.  Anticoagulation instructions were given to patient.  Results were reviewed/authorized by Cloyde Reams, RN, BSN.  She was notified by Cloyde Reams RN.         Prior Anticoagulation Instructions: INR 2.3  Today, Wednesday, August 31st, take Coumadin 2.5 tabs (12.5 mg). Then, continue taking Coumadin 1.5 tabs (7.5 mg) on all days except for Coumadin 2 tabs (10 mg) on Mondays and Wednesdays. Return to clinic in 3 weeks.   Current Anticoagulation  Instructions: INR 3.5  Continue on same dosage 1.5 tablets daily except 2 tablets on Mondays and Wednesdays.  Recheck in 4 weeks.

## 2010-02-24 NOTE — Medication Information (Signed)
Summary: rov/ewj  Anticoagulant Therapy  Managed by: Cloyde Reams, RN, BSN Referring MD: Sherryl Manges MD PCP: Dr Alroy Dust Supervising MD: Clifton James MD,Christopher Indication 1: Atrial Fibrillation (ICD-427.31) Indication 2: TIA (ICD-435.9) Lab Used: [facility] Site: [facility] INR POC 2.9 INR RANGE 2.5 - 3.5  Dietary changes: no    Health status changes: no    Bleeding/hemorrhagic complications: no    Recent/future hospitalizations: no    Any changes in medication regimen? no    Recent/future dental: no  Any missed doses?: no       Is patient compliant with meds? yes       Allergies: 1)  ! * All "qt" Drugs 2)  * Adhesive Tape  Anticoagulation Management History:      The patient is taking warfarin and comes in today for a routine follow up visit.  Positive risk factors for bleeding include history of CVA/TIA.  Negative risk factors for bleeding include an age less than 53 years old.  The bleeding index is 'intermediate risk'.  Positive CHADS2 values include Prior Stroke/CVA/TIA.  Negative CHADS2 values include Age > 38 years old.  The start date was 01/23/1998.  Her last INR was 2.2 ratio.  Anticoagulation responsible provider: Clifton James MD,Christopher.  INR POC: 2.9.  Cuvette Lot#: [id number].  Exp: 11/2010.    Anticoagulation Management Assessment/Plan:      The patient's current anticoagulation dose is Coumadin 5 mg  tabs: take as directed by the [facility]in Clinic....  The target INR is 2.5-3.5.  The next INR is due 12/21/2009.  Anticoagulation instructions were given to patient.  Results were reviewed/authorized by Cloyde Reams, RN, BSN.         Prior Anticoagulation Instructions: INR 3.5  Continue on same dosage 1.5 tablets daily except 2 tablets on Mondays and Wednesdays.  Recheck in 4 weeks.    Current Anticoagulation Instructions: INR 2.9  Continue taking 2 tablets on monday and wednesday. And take 1.5 tablets all other days. Recheck in 4 weeks.

## 2010-02-24 NOTE — Miscellaneous (Signed)
Summary: Device change out  Clinical Lists Changes  Observations: Added new observation of PPM DOI: 04/01/2009 (04/07/2009 11:06) Added new observation of PPM SERL#: [id number] (04/07/2009 11:06) Added new observation of PPM MODL#: [id number] (04/07/2009 11:06) Added new observation of PPMEXPLCOMM: 04/01/09 Medtronic Kappa [id number] H explanted (04/07/2009 11:06)      PPM Specifications Following MD:  Sherryl Manges, MD     PPM Vendor:  Medtronic     PPM Model Number:  ADDRL1     PPM Serial Number:  [id number] PPM DOI:  04/01/2009     PPM Implanting MD:  Sherryl Manges, MD  Lead 1    Location: [facility]     DOI: 11/22/2001     Model #: [id number]     Serial #: [id number]     Status: active Lead 2    Location: [facility]     DOI: 11/28/1996     Model #: [id number]     Serial #: [id number]     Status: active  Magnet Response Rate:  BOL 85 ERI  65  Indications:  Brady  Explantation Comments:  04/01/09 Medtronic Kappa [id number] H explanted  PPM Follow Up Pacer Dependent:  No      Episodes Coumadin:  Yes  Parameters Mode:  DDDR     Lower Rate Limit:  70     Upper Rate Limit:  150 Paced AV Delay:  250     Sensed AV Delay:  160

## 2010-03-02 NOTE — Cardiovascular Report (Signed)
Summary: Office Visit   Office Visit   Imported By: Roderic Ovens 02/24/2010 10:32:35  _____________________________________________________________________  External Attachment:    Type:   Image     Comment:   External Document

## 2010-03-03 DIAGNOSIS — I4891 Unspecified atrial fibrillation: Secondary | ICD-10-CM

## 2010-03-03 DIAGNOSIS — G459 Transient cerebral ischemic attack, unspecified: Secondary | ICD-10-CM

## 2010-03-09 ENCOUNTER — Encounter (INDEPENDENT_AMBULATORY_CARE_PROVIDER_SITE_OTHER): Payer: No Typology Code available for payment source

## 2010-03-09 ENCOUNTER — Encounter: Payer: Self-pay | Admitting: Internal Medicine

## 2010-03-09 DIAGNOSIS — Z7901 Long term (current) use of anticoagulants: Secondary | ICD-10-CM

## 2010-03-09 DIAGNOSIS — I4891 Unspecified atrial fibrillation: Secondary | ICD-10-CM

## 2010-03-10 NOTE — Letter (Signed)
Summary: [facility]  [facility]   Imported By: Sherian Rein 02/28/2010 10:15:13  _____________________________________________________________________  External Attachment:    Type:   Image     Comment:   External Document

## 2010-03-11 ENCOUNTER — Encounter: Payer: Self-pay | Admitting: Cardiovascular Disease

## 2010-03-16 NOTE — Miscellaneous (Signed)
Summary: acebutolol 200mg  1 tab qd done daj  Clinical Lists Changes  Medications: Rx of ACEBUTOLOL HCL 200 MG  CAPS (ACEBUTOLOL HCL) once daily;  #90 x 3;  Signed;  Entered by: Burnett Kanaris, CNA;  Authorized by: Colon Branch, MD, [facility];  Method used: [facility] to [facility] [address] [phone]*, [address], [place], [place], [place]  [address], Ph: [phone] or [phone], Fax: [phone]    Prescriptions: ACEBUTOLOL HCL 200 MG  CAPS (ACEBUTOLOL HCL) once daily  #90 x 3   Entered by:   Burnett Kanaris, CNA   Authorized by:   Colon Branch, MD, [facility]   Signed by:   Burnett Kanaris, CNA on 03/11/2010   Method used:   [facility]ron[facility] to        [facility] [address] [phone]* (retail)       [address]       [place], [place]  [address]       Ph: [phone] or [phone]       Fax: [phone]   RxID:   [id number]

## 2010-03-16 NOTE — Medication Information (Signed)
Summary: [facility]  Anticoagulant Therapy  Managed by: Weston Brass, PharmD Referring MD: Sherryl Manges MD PCP: Dr Alroy Dust Supervising MD: Gala Romney MD, Reuel Boom Indication 1: Atrial Fibrillation (ICD-427.31) Indication 2: TIA (ICD-435.9) Lab Used: [facility] Site: [facility] INR POC 2.3 INR RANGE 2.5 - 3.5  Dietary changes: no    Health status changes: no    Bleeding/hemorrhagic complications: no    Recent/future hospitalizations: no    Any changes in medication regimen? no    Recent/future dental: no  Any missed doses?: no       Is patient compliant with meds? yes       Allergies: 1)  ! * All "qt" Drugs 2)  * Adhesive Tape  Anticoagulation Management History:      The patient is taking warfarin and comes in today for a routine follow up visit.  Positive risk factors for bleeding include history of CVA/TIA.  Negative risk factors for bleeding include an age less than 18 years old.  The bleeding index is 'intermediate risk'.  Positive CHADS2 values include Prior Stroke/CVA/TIA.  Negative CHADS2 values include Age > 33 years old.  The start date was 01/23/1998.  Her last INR was 2.3 ratio.  Anticoagulation responsible provider: Carsynn Bethune MD, Reuel Boom.  INR POC: 2.3.  Cuvette Lot#: [id number].  Exp: 01/2011.    Anticoagulation Management Assessment/Plan:      The patient's current anticoagulation dose is Coumadin 5 mg  tabs: take as directed by the Coumadin Clinic....  The target INR is 2.5-3.5.  The next INR is due 03/23/2010.  Anticoagulation instructions were given to patient.  Results were reviewed/authorized by Weston Brass, PharmD.  She was notified by Margot Chimes PharmD Candidate.         Prior Anticoagulation Instructions: INR 2.3 Eating more salads. Take 2 1/2 tablets today and then resume 1.5 tablets every day except 2 tablets on Monday and Wednesday. Recheck in 3 weeks   Current Anticoagulation Instructions: INR 2.3  Take 2 and 1/2 tablets today and  then start taking 1 1/2 tablets (7.5mg ) everyday except on Mondays, Wednesdays, and Fridays when you take 2 tablets (10mg ).  Recheck INR in 2 weeks.

## 2010-03-16 NOTE — Letter (Signed)
Summary: [facility] Office Visit Note   [facility] Office Visit Note   Imported By: Roderic Ovens 03/04/2010 15:42:41  _____________________________________________________________________  External Attachment:    Type:   Image     Comment:   External Document

## 2010-03-22 ENCOUNTER — Encounter: Payer: Self-pay | Admitting: Cardiovascular Disease

## 2010-03-22 DIAGNOSIS — I4891 Unspecified atrial fibrillation: Secondary | ICD-10-CM

## 2010-03-22 DIAGNOSIS — G459 Transient cerebral ischemic attack, unspecified: Secondary | ICD-10-CM

## 2010-03-23 ENCOUNTER — Encounter: Payer: Self-pay | Admitting: Cardiovascular Disease

## 2010-03-23 ENCOUNTER — Encounter (INDEPENDENT_AMBULATORY_CARE_PROVIDER_SITE_OTHER): Payer: No Typology Code available for payment source

## 2010-03-23 DIAGNOSIS — Z7901 Long term (current) use of anticoagulants: Secondary | ICD-10-CM

## 2010-03-23 DIAGNOSIS — I4891 Unspecified atrial fibrillation: Secondary | ICD-10-CM

## 2010-03-23 LAB — CONVERTED CEMR LAB: POC INR: 2.9

## 2010-03-29 DIAGNOSIS — Z950 Presence of cardiac pacemaker: Secondary | ICD-10-CM | POA: Insufficient documentation

## 2010-03-30 ENCOUNTER — Encounter: Payer: Self-pay | Admitting: Internal Medicine

## 2010-03-30 ENCOUNTER — Encounter (INDEPENDENT_AMBULATORY_CARE_PROVIDER_SITE_OTHER): Payer: No Typology Code available for payment source | Admitting: Internal Medicine

## 2010-03-30 DIAGNOSIS — Q078 Other specified congenital malformations of nervous system: Secondary | ICD-10-CM

## 2010-03-30 DIAGNOSIS — I498 Other specified cardiac arrhythmias: Secondary | ICD-10-CM

## 2010-03-30 DIAGNOSIS — Z950 Presence of cardiac pacemaker: Secondary | ICD-10-CM

## 2010-03-30 DIAGNOSIS — I4581 Long QT syndrome: Secondary | ICD-10-CM

## 2010-03-31 NOTE — Medication Information (Signed)
Summary: [facility]v [facility]  Anticoagulant Therapy  Managed by: Windell Hummingbird, RN Referring MD: Sherryl Manges MD PCP: Dr Alroy Dust Supervising MD: Excell Seltzer MD, Casimiro Needle Indication 1: Atrial Fibrillation (ICD-427.31) Indication 2: TIA (ICD-435.9) Lab Used: [facility] Site: [facility] INR POC 2.9 INR RANGE 2.5 - 3.5  Dietary changes: no    Health status changes: no    Bleeding/hemorrhagic complications: no    Recent/future hospitalizations: no    Any changes in medication regimen? no    Recent/future dental: no  Any missed doses?: no       Is patient compliant with meds? yes       Allergies: 1)  ! * All "qt" Drugs 2)  * Adhesive Tape  Anticoagulation Management History:      The patient is taking warfarin and comes in today for a routine follow up visit.  Positive risk factors for bleeding include history of CVA/TIA.  Negative risk factors for bleeding include an age less than 66 years old.  The bleeding index is 'intermediate risk'.  Positive CHADS2 values include Prior Stroke/CVA/TIA.  Negative CHADS2 values include Age > 80 years old.  The start date was 01/23/1998.  Her last INR was 2.3 ratio.  Anticoagulation responsible provider: Excell Seltzer MD, Casimiro Needle.  INR POC: 2.9.  Cuvette Lot#: [id number].  Exp: 01/2011.    Anticoagulation Management Assessment/Plan:      The patient's current anticoagulation dose is Coumadin 5 mg  tabs: take as directed by the Coumadin Clinic....  The target INR is 2.5-3.5.  The next INR is due 04/20/2010.  Anticoagulation instructions were given to patient.  Results were reviewed/authorized by Windell Hummingbird, RN.  She was notified by Windell Hummingbird, RN.         Prior Anticoagulation Instructions: INR 2.3  Take 2 and 1/2 tablets today and then start taking 1 1/2 tablets (7.5mg ) everyday except on Mondays, Wednesdays, and Fridays when you take 2 tablets (10mg ).  Recheck INR in 2 weeks.   Current Anticoagulation Instructions: INR 2.9 Continue taking 1 1/2 tablets every  day, except 2 tablets on Mondays, Wednesdays, and Fridays. Recheck in 4 weeks.

## 2010-04-05 NOTE — Assessment & Plan Note (Signed)
Summary: PACER CHECK/MDT/AMBER/SAF   Visit Type:  fu visit Primary Provider:  Dr Alroy Dust   History of Present Illness:   Amy Lane is seen in followup for sinus bradycardia status post pacemaker, status post generator replacement and lead repair March 2011.  She also has a history of Ehlers-Danlos syndrome in the context of a marfanoid habitus, mitral valve prolapse status post mitral valve repair at the [facility] clinic with prior TIA on chronic Coumadin as well as syncope due to diagnosis of long QT syndrome.  SHe has had significant dysautonomic symptoms consistent with POTS  The patient denies SOB, chest pain, edema or palpitations, he says better than she has in 18 years.  Current Medications (verified): 1)  Coumadin 5 Mg  Tabs (Warfarin Sodium) .... Take As Directed By The Coumadin Clinic.Marland KitchenMarland Kitchen 2)  Bayer Low Strength 81 Mg  Tbec (Aspirin) .... Take 1 Tablet By Mouth Once A Day 3)  Acebutolol Hcl 200 Mg  Caps (Acebutolol Hcl) .... Once Daily 4)  Synthroid 75 Mcg  Tabs (Levothyroxine Sodium) .... Take 1 Tab By Mouth Once Daily.Marland KitchenMarland Kitchen 5)  Lorazepam 1 Mg  Tabs (Lorazepam) .... Take 1/2 To 1 Tab By Mouth Up To 3 Times Daily As Directed...  Allergies: 1)  ! * All "qt" Drugs 2)  * Adhesive Tape  Past History:  Past Medical History: Last updated: 03/29/2010 SVT/ PSVT/ PAT (ICD-427.0) Hx of BRADYCARDIA-TACHYCARDIA SYNDROME (ICD-427.81) ATRIAL FIBRILLATION (ICD-427.31) DYSAUTONOMIA (ICD-742.8) Hx of SYNCOPE (ICD-780.2) CARDIAC PACEMAKER DDD MDT (ICD-V45.01)-Medtronic Kappa 901 Pacemaker explantation, dual-chamber pacemaker   implantation, and lead repair. -2011/Medtronic Adapta L1 COUMADIN THERAPY (ICD-V58.61)  ALLERGIC RHINITIS (ICD-477.9) MITRAL VALVE PROLAPSE (ICD-424.0) CEREBROVASCULAR DISEASE (ICD-437.9) HYPERCHOLESTEROLEMIA (ICD-272.0) HYPOTHYROIDISM (ICD-244.9) HEMORRHOIDS (ICD-455.6) EHLERS-DANLOS SYNDROME (ICD-756.83) FIBROMYALGIA (ICD-729.1) Hx of TRANSIENT  ISCHEMIC ATTACK (ICD-435.9) DEPRESSION (ICD-311) CARCINOMA, BASAL CELL (ICD-173.9)  Vital Signs:  Patient profile:   60 year old female Height:      70 inches Weight:      140 pounds BMI:     20.16 Pulse rate:   60 / minute BP sitting:   100 / 60  (left arm)  Vitals Entered By: Laurance Flatten CMA (March 30, 2010 12:11 PM)  Physical Exam  General:  The patient was alert and oriented in no acute distress. HEENT Normal.  Neck veins were flat, carotids were brisk.  Lungs were clear.  Heart sounds were regular without murmurs or gallops.  Abdomen was soft with active bowel sounds. There is no clubbing cyanosis or edema. Skin Warm and dry    PPM Specifications Following MD:  Amy Manges, MD     PPM Vendor:  Medtronic     PPM Model Number:  ADDRL1     PPM Serial Number:  [id number] PPM DOI:  04/01/2009     PPM Implanting MD:  Amy Manges, MD  Lead 1    Location: [facility]     DOI: 11/22/2001     Model #: [id number]     Serial #: [id number]     Status: active Lead 2    Location: [facility]     DOI: 11/28/1996     Model #: [id number]     Serial #: [id number]     Status: active  Magnet Response Rate:  BOL 85 ERI  65  Indications:  Brady  Explantation Comments:  04/01/09 Medtronic Kappa [id number] H explanted  PPM Follow Up Pacer Dependent:  No      Episodes Coumadin:  Yes  Parameters Mode:  DDDR+  Lower Rate Limit:  60     Upper Rate Limit:  130 Paced AV Delay:  150     Sensed AV Delay:  120  Impression & Recommendations:  Problem # 1:  DYSAUTONOMIA (ICD-742.8) stable on current regime  Problem # 2:  ATRIAL FIBRILLATION (ICD-427.31) no intercurrent atrial fibrillation. I have reviewed with her the alternative anticoagulants. When she sees Dr. Jamse Mead in the spring perhaps apixoban will be released. Furthermore, I have suggested that her aspirin be discontinued based on the data from the sportif  trials demonstrating increased risk but no increased benefit to aspirin added to Coumadin  Her updated  medication list for this problem includes:    Coumadin 5 Mg Tabs (Warfarin sodium) .Marland Kitchen... Take as directed by the coumadin clinic...    Bayer Low Strength 81 Mg Tbec (Aspirin) .Marland Kitchen... Take 1 tablet by mouth once a day    Acebutolol Hcl 200 Mg Caps (Acebutolol hcl) ..... Once daily  Problem # 3:  SVT/ PSVT/ PAT (ICD-427.0) no recurrent tachycardia Her updated medication list for this problem includes:    Coumadin 5 Mg Tabs (Warfarin sodium) .Marland Kitchen... Take as directed by the coumadin clinic...    Bayer Low Strength 81 Mg Tbec (Aspirin) .Marland Kitchen... Take 1 tablet by mouth once a day    Acebutolol Hcl 200 Mg Caps (Acebutolol hcl) ..... Once daily  Problem # 4:  CARDIAC PACEMAKER DDD MDT (ICD-V45.01) Device parameters and data were reviewed and no changes were made  Patient Instructions: 1)  Your physician recommends that you continue on your current medications as directed. Please refer to the Current Medication list given to you today. 2)  Your physician wants you to follow-up in YEAR WITH DR Graciela Husbands:   You will receive a reminder letter in the mail two months in advance. If you don't receive a letter, please call our office to schedule the follow-up appointment.  Prevention & Chronic Care Immunizations   Influenza vaccine: Fluvax 3+  (10/13/2009)    Tetanus booster: 04/16/2009: Tdap    Pneumococcal vaccine: Historical  (01/24/2003)  Colorectal Screening   Hemoccult: Not documented    Colonoscopy: Not documented  Other Screening   Pap smear: Not documented    Mammogram: Not documented   Smoking status: quit  (10/13/2009)  Lipids   Total Cholesterol: 130  (10/13/2009)   LDL: 60  (10/13/2009)   LDL Direct: Not documented   HDL: 59.00  (10/13/2009)   Triglycerides: 54.0  (10/13/2009)    SGOT (AST): 27  (10/13/2009)   SGPT (ALT): 17  (10/13/2009)   Alkaline phosphatase: 57  (10/13/2009)   Total bilirubin: 0.4  (10/13/2009)  Self-Management Support :    Lipid self-management support:  Not documented

## 2010-04-12 ENCOUNTER — Ambulatory Visit: Payer: Self-pay | Admitting: Pulmonary Disease

## 2010-04-12 NOTE — Cardiovascular Report (Signed)
Summary: Office Visit   Office Visit   Imported By: Roderic Ovens 04/05/2010 16:07:58  _____________________________________________________________________  External Attachment:    Type:   Image     Comment:   External Document

## 2010-04-17 LAB — PROTIME-INR: Prothrombin Time: 20.8 seconds — ABNORMAL HIGH (ref 11.6–15.2)

## 2010-04-20 ENCOUNTER — Ambulatory Visit (INDEPENDENT_AMBULATORY_CARE_PROVIDER_SITE_OTHER): Payer: No Typology Code available for payment source | Admitting: *Deleted

## 2010-04-20 DIAGNOSIS — Z7901 Long term (current) use of anticoagulants: Secondary | ICD-10-CM

## 2010-04-20 DIAGNOSIS — G459 Transient cerebral ischemic attack, unspecified: Secondary | ICD-10-CM

## 2010-04-20 DIAGNOSIS — I4891 Unspecified atrial fibrillation: Secondary | ICD-10-CM

## 2010-04-20 NOTE — Patient Instructions (Signed)
Continue 7.5mg s everyday except 10 mg on Mondays, Wednesdays and Fridays. Recheck in 4 weeks.

## 2010-05-18 ENCOUNTER — Ambulatory Visit (INDEPENDENT_AMBULATORY_CARE_PROVIDER_SITE_OTHER): Payer: No Typology Code available for payment source | Admitting: *Deleted

## 2010-05-18 DIAGNOSIS — G459 Transient cerebral ischemic attack, unspecified: Secondary | ICD-10-CM

## 2010-05-18 DIAGNOSIS — I4891 Unspecified atrial fibrillation: Secondary | ICD-10-CM

## 2010-05-18 LAB — POCT INR: INR: 2.4

## 2010-05-23 ENCOUNTER — Encounter: Payer: Self-pay | Admitting: Pulmonary Disease

## 2010-05-23 ENCOUNTER — Ambulatory Visit (INDEPENDENT_AMBULATORY_CARE_PROVIDER_SITE_OTHER): Payer: No Typology Code available for payment source | Admitting: Pulmonary Disease

## 2010-05-23 DIAGNOSIS — E039 Hypothyroidism, unspecified: Secondary | ICD-10-CM

## 2010-05-23 DIAGNOSIS — E7800 Pure hypercholesterolemia, unspecified: Secondary | ICD-10-CM

## 2010-05-23 DIAGNOSIS — I4891 Unspecified atrial fibrillation: Secondary | ICD-10-CM

## 2010-05-23 DIAGNOSIS — I059 Rheumatic mitral valve disease, unspecified: Secondary | ICD-10-CM

## 2010-05-23 DIAGNOSIS — Q078 Other specified congenital malformations of nervous system: Secondary | ICD-10-CM

## 2010-05-23 DIAGNOSIS — I495 Sick sinus syndrome: Secondary | ICD-10-CM

## 2010-05-23 DIAGNOSIS — G459 Transient cerebral ischemic attack, unspecified: Secondary | ICD-10-CM

## 2010-05-23 DIAGNOSIS — Q796 Ehlers-Danlos syndrome, unspecified: Secondary | ICD-10-CM

## 2010-05-23 DIAGNOSIS — I679 Cerebrovascular disease, unspecified: Secondary | ICD-10-CM

## 2010-05-23 MED ORDER — LEVOTHYROXINE SODIUM 75 MCG PO TABS: 75.0000 ug | ORAL_TABLET | Freq: Every day | ORAL | Status: DC

## 2010-05-23 MED ORDER — LORAZEPAM 1 MG PO TABS: 1.0000 mg | ORAL_TABLET | Freq: Three times a day (TID) | ORAL | Status: DC

## 2010-05-23 NOTE — Patient Instructions (Signed)
Today we updated your meds in our EPIC system...    We refilled the meds you requested...  Please return to our lab one morning this week for your FASTING blood work...    Then call the [facility]REE in a few days for your results...    Dial [id number] & when prompted enter your patient number followed by the # symbol...    Your patient number is:  [id number]  Have a great time on your trip!!! Call for any problems... Let's plan a follow up visit in 6 months, sooner if needed.Marland KitchenMarland Kitchen

## 2010-05-23 NOTE — Progress Notes (Signed)
Subjective:    Patient ID: Amy Lane, female    DOB: 1950/10/26, 60 y.o.   MRN: [id number]  HPI 60 y/o WF here for a follow up visit... she has mult med problems as noted below... she is followed regularly by Drs Azucena Fallen for Cardiology, and Drs Purcell Nails for GI...  ~  April 16, 2009:  she states that she is feeling "great" & feels the Lexapro is really helping... had dual chamber pacemaker replaced 3/10 by DrKlein (for end-of-life), and she's gained 5# on Ensure supplements... she also saw DrNishan 12/10 for f/u of her Ehlers-Danlos, dysautonomia, SSS w/ pacer, carotid dis w/ tia, & MVP... CDopplers 2/11 stable... she is looking forward to an appt at [facility] in their [facility] for a second opinion regarding her Kraurosis Vulvae... OK TDAP today.  ~  October 13, 2009:  she was seen by Dr. Everlene Other at the [facility] at [facility] for her vulvar dermatitis & dryness & rec to use Crisco shortening Tid... she reports improved over the last 4mo- feeling better, in good spirits w/ the Lexapro helping, etc... she has f/u appts w/ DrKlein 6/11 & Walker Kehr 7/11> both felt she was doing well- noted normal atrial pacing w/ long latency on EKG & they are following for now... due for f/u fasting labs & Flu shot today... mild dermatitis on legs- LidexE written...  ~  May 23, 2010:  22mo ROV & she reports feeling "the best I have in yrs" attributed to stopping her Lipitor "too many neuro issues and not enough benefit" per DrWillis & [name];  She is looking forward to [place] vacation w/ family this summer; she was able to stop the Lexapro last January & doing fine without it now...  She denies CP, palpit, dizziness, syncope, edema, or cerebral ischemic symptoms... Due for f/u fasting labs> TChol 218, LDL 135, and she will continue to control this w/ low chol/ low fat diet...         Problem List:  ALLERGIC RHINITIS (ICD-477.9) - we discussed Rx w/ Zyrtek, Astepro Prn, etc... ~  CXR 3/11  showed left pacer, sternal wires, NAD...  MITRAL VALVE PROLAPSE (ICD-424.0) - followed by Walker Kehr... ~  2DEcho 11/08 showed myxomatous MV, annuloplasty ring, decr post leaflet excursion, norm LVH & wall motion... ~  NuclearStressTest 11/08 showed mild apical thinning... prev study 12/04 w/o ischemia or infarct & EF=68%... ~  she sees [place] every 6 months- for f/u MVP (s/p MV repair), dysautonomia (s/p pacer), & Ehlers-Danlos Syndrome> notes reviewed...  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 hx AFib/Flutter w/ ablation & pacemaker placed ... she has Ehlers-Danlos synd w/ prolonged QT interval...   CARDIAC PACEMAKER IN SITU (ICD-V45.01) - f/u DrKlein w/ CTChest 4/09- neg. ~  dual chamber pacer changed 3/11 by DrKlein for end-of-life...  DYSAUTONOMIA (ICD-742.8) - prev on Proamatine 5mg  tabs per DrKlein, but this was stopped...  CEREBROVASCULAR DISEASE (ICD-437.9) - on ASA 81mg /d & COUMADIN (followed in the clinic)... ~  she had a Left CAE 11/05 by [facility] for symptomatic left carotid stenosis w/ ulceration... ~  Carotid angiogram 5/08 by [facility] showed <30% right carotid stenosis ~  f/u CTA of Head & Neck 12/09 was neg= norm CTA head, & no signif carotid dis noted in neck. ~  CDoppler 2/11 showed stable mild carotid dis, left CAE w/ DPA is patent, 0-39% bilat ICA stenoses. ~  repear CDopplers 11/11 per Walker Kehr showed smooth plaque  in right bulb & distal left CCA- stable; 0-39% bilat ICA stenoses...  HYPERCHOLESTEROLEMIA (ICD-272.0) - on diet alone now, prev on Lip20- this was stopped 1/12 by Walker Kehr & she feels much better off this med! ~  FLP 05/13/07 on Lip20 showed TChol 119, TG 50, HDL 55, LDL 54 ~  FLP 5/10 on Lip20 showed TChol 120, TG 43, HDL 58, LDL 54 ~  FLP 9/11 on Lip20 showed TChol 130, TG 54, HDL 59, LDL 60 ~  FLP 5/12 on diet alone showed TChol 218, TG 91, HDL 57, LDL 135... Continue low chol, low fat  diet.  HYPOTHYROIDISM (ICD-244.9) - currently on SYNTHROID 2mcg/d... ~  labs 4/09 on Levoth88 showed TSH = 0.10... rec to decr Synthroid to 59mcg/d... ~  labs 9/09 on Levoth75 showed TSH = 1.67 ~  labs 5/10 on Levoth75 showed TSH = 2.92 ~  labs 9/11 on Levoth75 showed TSH= 1.37 ~  Labs 5/12 on Levothy75 showed TSH= 4.33  HEMORRHOIDS (ICD-455.6) - last colonoscopy 2/09 by DrJacobs showed only sm hems... f/u 62yrs.  GYN - followed by DrMcPhail and Dx w/ kraurosis vulvae 7/10... Prev on Premarin VagCream. ~  11/10: she discussed the Dx w/ me & will inquire about poss hyst/ BSO... ~  4/11:  eval in the [facility] by Dr Everlene Other showed vulvar dermatitis & dryness w/ rec for "Crisco" Tid!  FIBROMYALGIA (ICD-729.1) - she has been doing Yoga classes and this has helped...  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  Hx of TRANSIENT ISCHEMIC ATTACK (ICD-435.9) - she has been followed by DrWillis... s/p left carotid endarterectomy 11/05 by [facility]. ~  1/12: f/u eval by DrWillis reviewed...  Hx of SYNCOPE (ICD-780.2) - felt to be neurally mediated, ? related to long QT, no recur since pacer placed...  DEPRESSION (ICD-311) - she takes LORAZEPAM 1mg Qhs for insomnia... she prev saw DrGraves for counselling in [place]... ~  11/10: poor appetite & some wt loss prob related to depression & we discussed trial LEXAPRO 10mg /d. ~  3/11: she is very pleased- feels better, gained 5# w/ Ensure, etc... ~  9/11:  "I feel great" & Lexapro continued... ~  She was able to stop the Lexapro 1/12 (also stopped the Lipitor) & has done well off it ever since...  CARCINOMA, BASAL CELL (ICD-173.9) - skin cancer removed from left leg by [facility] 2008. ~  12/11:  She reports bx of rash on legs= lichen planus per DrHall, treated w/ topical ointment...  Health Maintenance: ~  GI:  followed by DrJacobs w/ colonoscopy 2/09 showing only sm hems... ~  GYN:  followed by DrMcPhail,  and Dr. Everlene Other at [facility] vulvar clinic... ~  Labs 9/11 showed Vit D level = 39 & rec to start 1000 u OTC supplement... ~  Immunizations:  she had TDAP 3/11... she gets yearly Flu vaccine in the Fall of the yr...    Past Surgical History  Procedure Date  . Complex mitral valve repair     at the [facility]  . Left carotid endarterectomy 11/2003    by Dr. Amada Kingfisher  . Pacemaker insertion     medtronic kappa [phone]    Outpatient Encounter Prescriptions as of 05/23/2010  Medication Sig Dispense Refill  . acebutolol (SECTRAL) 200 MG capsule Take one tablet daily       . aspirin 81 MG chewable tablet Chew 81 mg by mouth daily.        Marland Kitchen levothyroxine (SYNTHROID, LEVOTHROID) 75  MCG tablet Take 75 mcg by mouth daily.        Marland Kitchen LORazepam (ATIVAN) 1 MG tablet Take 1 mg by mouth every 8 (eight) hours.        Marland Kitchen warfarin (COUMADIN) 5 MG tablet Take by mouth as directed.          ALLERGIRES:  All QT drugs, and Adhesive Tape...  Current Medications, Allergies, Past Medical History, Past Surgical History, Family History, and Social History were reviewed in [employer] record.   Review of Systems         See HPI - all other systems neg except as noted... The patient complains of dyspnea on exertion.  The patient denies anorexia, fever, weight loss, weight gain, vision loss, decreased hearing, hoarseness, chest pain, syncope, peripheral edema, prolonged cough, headaches, hemoptysis, abdominal pain, melena, hematochezia, severe indigestion/heartburn, hematuria, incontinence, muscle weakness, suspicious skin lesions, transient blindness, difficulty walking, depression, unusual weight change, abnormal bleeding, enlarged lymph nodes, and angioedema.     Objective:   Physical Exam     WD, Thin, 60 y/o WF in NAD... she is 5'11"Tall and 141# = BMI~20... Vital Signs:  Reviewed... GENERAL:  Alert & oriented; pleasant & cooperative... HEENT:  [place]/AT, EOM-wnl, PERRLA, EACs-clear,  TMs-wnl, NOSE-clear, THROAT-clear & wnl. NECK:  Supple w/ fair ROM; no JVD; normal carotid impulses w/o bruits, left CAE scar; no thyromegaly or nodules palpated; no lymphadenopathy. CHEST:  Clear to P & A; without wheezes/ rales/ or rhonchi heard... HEART:  Regular Rhythm; pacer on left, without murmurs/ rubs/ or gallops detected... ABDOMEN:  Soft & nontender; normal bowel sounds; no organomegaly or masses palpated... EXT: without deformities or arthritic changes; no varicose veins/ venous insuffic/ or edema. NEURO:  CN's intact;  no focal neuro deficits... DERM:  s/p skin cancer removed from left leg; min rash noted...   Assessment & Plan:   MVP/ MV Repair> Tachy-Brady/ PACER> Dysautonomia>  Followed by Walker Kehr & DrKlein, doing well on Coumadin & Sectral, continue same...  Cerebrovasc Dis>  Also takes ASA 81mg /d, no cerebral ischemic symptoms...  CHOL>  She feels much better off the Lipitor, & trying to control the lipids on diet alone, may be a candidate for Zetia later...  Hypothyroid>  Stable on the synthroid 24mcg/d...  GYN>  Stable & followed at [facility]...  EHLERS-DANLOS>  Aware, stable, she has been feeling better & in great spirits...  Other medical problems as noted>

## 2010-05-24 ENCOUNTER — Other Ambulatory Visit (INDEPENDENT_AMBULATORY_CARE_PROVIDER_SITE_OTHER): Payer: No Typology Code available for payment source

## 2010-05-24 ENCOUNTER — Other Ambulatory Visit (INDEPENDENT_AMBULATORY_CARE_PROVIDER_SITE_OTHER): Payer: No Typology Code available for payment source | Admitting: Pulmonary Disease

## 2010-05-24 DIAGNOSIS — Z13220 Encounter for screening for lipoid disorders: Secondary | ICD-10-CM

## 2010-05-24 DIAGNOSIS — E039 Hypothyroidism, unspecified: Secondary | ICD-10-CM

## 2010-05-24 DIAGNOSIS — E7800 Pure hypercholesterolemia, unspecified: Secondary | ICD-10-CM

## 2010-05-24 LAB — LDL CHOLESTEROL, DIRECT: Direct LDL: 134.5 mg/dL

## 2010-05-24 LAB — LIPID PANEL: VLDL: 18.2 mg/dL (ref 0.0–40.0)

## 2010-05-24 LAB — TSH: TSH: 4.33 u[IU]/mL (ref 0.35–5.50)

## 2010-05-25 ENCOUNTER — Encounter: Payer: Self-pay | Admitting: Cardiovascular Disease

## 2010-05-25 ENCOUNTER — Ambulatory Visit (INDEPENDENT_AMBULATORY_CARE_PROVIDER_SITE_OTHER): Payer: No Typology Code available for payment source | Admitting: Cardiovascular Disease

## 2010-05-25 VITALS — BP 112/67 | HR 79 | Resp 12 | Ht 70.0 in | Wt 139.0 lb

## 2010-05-25 DIAGNOSIS — I4891 Unspecified atrial fibrillation: Secondary | ICD-10-CM

## 2010-05-25 DIAGNOSIS — G459 Transient cerebral ischemic attack, unspecified: Secondary | ICD-10-CM

## 2010-05-25 DIAGNOSIS — E7800 Pure hypercholesterolemia, unspecified: Secondary | ICD-10-CM

## 2010-05-25 DIAGNOSIS — Q078 Other specified congenital malformations of nervous system: Secondary | ICD-10-CM

## 2010-05-25 DIAGNOSIS — I059 Rheumatic mitral valve disease, unspecified: Secondary | ICD-10-CM

## 2010-05-25 MED ORDER — ACEBUTOLOL HCL 200 MG PO CAPS: 200.0000 mg | ORAL_CAPSULE | Freq: Every day | ORAL | Status: DC

## 2010-05-25 MED ORDER — WARFARIN SODIUM 5 MG PO TABS: 5.0000 mg | ORAL_TABLET | ORAL | Status: DC

## 2010-05-25 NOTE — Progress Notes (Signed)
Amy Lane is seen in followup for sinus bradycardia status post pacemaker, status post generator replacement and lead repair March 2011.  She also has a history of Ehlers-Danlos syndrome in the context of a marfanoid habitus, mitral valve prolapse status post mitral valve repair at the [facility] clinic with prior TIA on chronic Coumadin as well as syncope due to diagnosis of long QT syndrome.  SHe has had significant dysautonomic symptoms consistent with POTS  S/P left CEA with normal duplex 11/11  Discussed low carb diet and exercise program for weight gain Discussed Gibson Ramp as alternative to coumadin but would not change until it can be reversed  Has a wonderful trip to [place] planned with her husband Amy Lane.  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  ROS: Denies fever, malais, weight loss, blurry vision, decreased visual acuity, cough, sputum, SOB, hemoptysis, pleuritic pain, palpitaitons, heartburn, abdominal pain, melena, lower extremity edema, claudication, or rash.   General: Affect appropriate Healthy:  appears stated age HEENT: normal Neck supple with no adenopathy JVP normal no bruits no thyromegaly Lungs clear with no wheezing and good diaphragmatic motion Heart:  S1/S2 no murmur,rub, gallop or click PMI normal Abdomen: benighn, BS positve, no tenderness, no AAA no bruit.  No HSM or HJR Distal pulses intact with no bruits No edema Neuro non-focal Skin warm and dry No muscular weakness   Current Outpatient Prescriptions  Medication Sig Dispense Refill  . acebutolol (SECTRAL) 200 MG capsule Take one tablet daily       . aspirin 81 MG tablet Take 81 mg by mouth daily.        Marland Kitchen levothyroxine (SYNTHROID, LEVOTHROID) 75 MCG tablet Take 1 tablet (75 mcg total) by mouth daily.  90 tablet  3  . LORazepam (ATIVAN) 1 MG tablet Take 1 tablet (1 mg total)  by mouth every 8 (eight) hours.  90 tablet  5  . warfarin (COUMADIN) 5 MG tablet Take by mouth as directed.        Marland Kitchen DISCONTD: aspirin 81 MG chewable tablet Chew 81 mg by mouth daily.          Allergies  Review of patient's allergies indicates not on file.  Electrocardiogram:  Assessment and Plan

## 2010-05-25 NOTE — Assessment & Plan Note (Signed)
Stable with no presyncope or postural signs.  Keep hydrated

## 2010-05-25 NOTE — Assessment & Plan Note (Signed)
S/P repair with no residual MR

## 2010-05-25 NOTE — Assessment & Plan Note (Signed)
Pacer backup with good anticoagulation.  Continue coumadin for now and consider Xa or Factor 2 inhibitor when antidotes available

## 2010-05-25 NOTE — Patient Instructions (Signed)
Your physician recommends that you schedule a follow-up appointment in: 6 MONTHS WITH DR NISHAN  Your physician recommends that you continue on your current medications as directed. Please refer to the Current Medication list given to you today. 

## 2010-05-25 NOTE — Assessment & Plan Note (Signed)
F/U labs done by Dr Kriste Basque  Avoid statins as her memory is much better since stopping lipitor

## 2010-05-25 NOTE — Assessment & Plan Note (Signed)
Left CEA wide open on duplex 11/11  Continue bab asa.  F/U Dr Anne Hahn

## 2010-05-26 ENCOUNTER — Encounter: Payer: Self-pay | Admitting: Pulmonary Disease

## 2010-06-06 ENCOUNTER — Other Ambulatory Visit: Payer: Self-pay

## 2010-06-06 ENCOUNTER — Encounter: Payer: No Typology Code available for payment source | Admitting: *Deleted

## 2010-06-06 DIAGNOSIS — I4891 Unspecified atrial fibrillation: Secondary | ICD-10-CM

## 2010-06-06 MED ORDER — WARFARIN SODIUM 5 MG PO TABS: ORAL_TABLET | ORAL | Status: DC

## 2010-06-07 NOTE — Assessment & Plan Note (Signed)
 HEALTHCARE                         ELECTROPHYSIOLOGY OFFICE NOTE   COURTNE, LIGHTY                    MRN:          [id number]  DATE:09/10/2007                            DOB:          01-Nov-1950    Ms. Radliff is seen in followup for spells that are characterized by  diaphoresis, weakness, and shortness of breath.  When she was seen here  a couple of weeks ago because of concerns about ventricular high-rate  episodes, we determined that these were atrial tachycardias driving her  ventricular rates, and then, the atrial signals were lost in a  postventricular atrial blanking.   At this point, she continues to have these spells, and I noted also that  her Sectral dose had been decreased a couple of months ago because of  fatigue, and it is my impression anyway without having the hard data in  front of me that there has been a concurrent increase in these episodes  with the decrease in her beta-blocker.   On examination, her blood pressure today was 110/72 with a pulse of 82.  The lungs were clear.  The heart sounds were regular. The extremities  were without edema.  She is in no acute distress.   Interrogation of her pacemaker was reviewed with the patient extensively  and with her husband.   IMPRESSION:  1. Recurrent episodes of atrial tachycardia.  2. Spells of weakness, shortness of breath, and diaphoresis, question      associated with recurrent episodes of atrial tachycardia.  3. Dysautonomia.  4. Ehlers-Danlos.  5. Bradycardia, status post pacemaker.  6. Mitral valve regurgitation, status post repair.  7. Long QT.   Ms. Strike spells may be arrhythmic.  We will plan to try a beta-  blocker trial with metoprolol succinate 50, atenolol 50, and Inderal LA  60.  She is to take half a dose in the evening through the first 3-4  days to see how she tolerates them, and then, she is to let us know if  she finds one that she tolerates,  and then, we will work on efficacy  after that.   We will plan to see her again in 3 months' time, and we will work on the  other stuff intervally on the telephone.     Duke Salvia, MD, [facility]  Electronically Signed    SCK/MedQ  DD: 09/10/2007  DT: 09/11/2007  Job #: [id number]

## 2010-06-07 NOTE — Assessment & Plan Note (Signed)
[facility]                            CARDIOLOGY OFFICE NOTE   Amy, Lane                    MRN:          [id number]  DATE:06/07/2006                            DOB:          1950/04/09    Amy Lane returns today for followup. She is fairly complicated. She has  dysautonomia with history of paroxysmal atrial fibrillation and sick  sinus syndrome. She has also had ulcerated plaques in her carotids with  TIAs.   In regards to her heart rhythm she has been followed by Dr. Graciela Husbands using  monitoring through her pacemaker. She has been having atrial  tachycardias. I do not think this has been atrial fibrillation. I  actually had Amy Lane come and review her recent submissions.   Over the last 3 weeks she has only had 1 eight minute episode of  tachycardia. The remaining episodes have all been only 1 to 3 seconds.  There was an issues of possibly changing the patient's Sectral. It has  been increased to 200 b.i.d., given the fact that she has only had 1  episode of atrial tachycardia in the last 3 weeks that have lasted more  than a few seconds, I told her we would probably keep her on Sectral.   In regards to her mitral valve prolapse this has been stable. She is not  having any problems in regards to heart failure. She prefers to continue  SBE prophylaxis.   In regards to her previous TIA and endarterectomy, she had a duplex scan  in February of this year at our office. There were no ulcerated plaques  and a Dacron patch angioplasty was widely patent. She is usually  maintained on aspirin and Coumadin therapy.   In regards to her plaque burden and risk factor modification, we have  had  her on Lipitor, both to decrease the risk of recurrent plaque in  her carotid arteries and stroke prophylaxis.   Her last set of LFTs on April 17 were normal and her last LDL  cholesterol was quite low at 42.   REVIEW OF SYSTEMS:  Remarkable for  very infrequent palpitations,  particularly at night when she is not distracted. She has not had any  syncope. Her postural symptoms are much improved. She apparently needs a  colonoscopy.   She met with Dr. Lina Lane and there were multiple issues to discuss.   My advice to Amy Lane was as follows:  I told her that I would probably  take the 10% risk of being done on Coumadin to avoid a lot of the  hassles; however, if her insurance company with cover Lovenox, she  certainly could be bridged with Lovenox. If she has a colonoscopy on  Lovenox therapy, the morning dose would need to be held. Her colonoscopy  would need to be done in the Endoscopy Center at [facility] since a potential  biopsy would require pacemaker interrogation.   If she is done on Coumadin, there is probably only a 10% chance she  would have to have the procedure redone for biopsies. Procedure done on  Coumadin could be done  at the [facility]. She will talk to her  insurance company to see:  1. If they will pay for Lovenox.  2. If they would pay for a repeat procedure if the initial one was      done on Coumadin.   Review of systems is otherwise negative. In regards to the colonoscopy,  it is a screening colonoscopy and she has not had any significant melena  or bleeding. SHE IS ALLERGIC TO ALL QT PROLONGING DRUGS.   PHYSICAL EXAMINATION:  Remarkable for an aesthetic happy, thin, middle  aged female in no distress. She is afebrile. Blood pressure is 110/68.  She is not postural. Pulse is 70 and regular, weight is 136, room air  sats are 100%.  HEENT: Normal. She is status post previous right CEA. There is no  thyromegaly. No lymphadenopathy. No residual bruits.  LUNGS: Clear with no wheezing and normal diaphragmatic motion. There is  an S1, S2. I do not hear a click, murmur, rub, or gallop. PMI is normal.  Pacemaker is under the right clavicle in good position.  Bowel sounds are positive. Abdominal aorta is  palpable but not tender.  There is no hepatosplenomegaly. No hepatojugular reflux. No AAA. No  renal bruits. Femorals are intact with no bruits. PTs are +2  bilaterally.  There is no edema.  NEURO: Nonfocal.  There is no muscular weakness.   Her medications include;  1. Synthroid 88 mcg a day.  2. Sectral 200 b.i.d.  3. Lorazepam 1 at bed time.  4. Coumadin as directed.  5. A baby aspirin a day.  6. Lipitor 20 a day.  7. ProAmatine 5 b.i.d.   IMPRESSION:  Atrial arrhythmia currently stable on Sectral 200 b.i.d. to  follow up with Dr. Graciela Husbands, continue monitoring through pacemaker with  Amy Lane, possible switch of beta blockers in the future.   Mitral valve prolapse, stable. No significant MR, previous repair with  good left ventricular function.   Postural hypotension with dysautonomia, currently not having any  syncope. Continue ProAmatine, hydration, and salt intake.   History of TIA with ulcerated plaque, continue aspirin and Coumadin  therapy.   Need for colonoscopy, to talk to insurance company, 2 options include  being done in the outpatient [facility] Center at [facility] on Coumadin  with potential for repeat procedure if biopsy needed versus bridging  with Lovenox and being done at the Endoscopy Center at [facility] with ability  to interrogate pacemaker if biopsy needed.   Overall Amy Lane is doing well. She will follow up with Dr. Graciela Husbands in 3  months and probably with myself in 6 months.     Amy Lane. Amy Emms, MD, [facility]     PCN/MedQ  DD: 06/07/2006  DT: 06/07/2006  Job #: [id number]

## 2010-06-07 NOTE — Assessment & Plan Note (Signed)
[facility]                            CARDIOLOGY OFFICE NOTE   KINSEY, KARCH                    MRN:          [id number]  DATE:12/03/2006                            DOB:          05/03/1950    Amy Lane returns today for followup.  She has had previous left CVA from  Ehlers-Danlos syndrome and a carotid dissection.  She has had a mitral  valve repair with dysautonomia and syncope with pacemaker placement.   She is doing fairly well.  A lot of her current problems have to do with  depression.  She had a knee stye, which she is still not over.  She also  has significant dermatologic problems with precancerous lesion removed  from the right lower extremity recently.  She was depressed about not  being able to be out in the sun.  She has seen Dr. Luiz Blare for this.  She  is on Lexapro.   REVIEW OF SYSTEMS:  Remarkable for no significant pre-syncope.  No  palpitations.  No PND or orthopnea.  No chest pain.   The patient has seen Dr. Madilyn Fireman recently.  Apparently, there are some  issues regarding long QT syndrome.  Cindi does not have this, as far  as I can tell.  She has seen Dr. Graciela Husbands and does not need a  defibrillator.   She does have a pacemaker for vasodepressor syncope and dysautonomia.  I  reviewed all of her pacemaker charts.  Her underlying rhythm is sinus  brady at a rate of 50 and she is primarily paced at a rate of above 70  with a low rate limit set there.  Last carotid duplex done on August 27, 2006 was benign.  There was some minor stranding in the distal right  common carotid artery, but I did not think this was significant.  Her  velocities were all under 115 meters per second.   CURRENT MEDICATIONS:  1. Synthroid 88 mcg per day.  2. Flexeril 200 b.i.d.  3. Lorazepam 1 mg nightly.  4. Coumadin as directed.  5. An aspirin a day.  6. Lipitor 20 a day.  7. Ptomatine 10 a day.  8. Lexapro 10 a day.   EXAM:  Remarkable for a  thin middle aged white female in no distress.  Affect is jovial.  Weight is 137, blood pressure 130/78, pulse 75 and paced.  Respiratory  rate 14.  Afebrile.  HEENT:  Unremarkable.  NECK:  Supple.  Faint left carotid bruit.  No lymphadenopathy,  thyromegaly, or JVP elevation.  LUNGS:  Clear.  Good diaphragmatic motion.  No wheezing.  S1, S2.  I cannot hear an MR murmur.  PMI is normal.  ABDOMEN:  Benign.  Bowel sounds positive.  No hepatosplenomegaly.  No  hepatojugular reflux.  Distal pulses intact.  No edema.  NEURO:  Nonfocal.   IMPRESSION:  1. Dysautonomia.  Continue ptomatine.  No postural symptoms at this      time.  Try to titrate back to 5 mg once a day.  2. History of mitral valve repair with prolapse.  Followup  echocardiogram.  No significant mitral regurgitation by exam.      Continue subacute bacterial endocarditis prophylaxis.  3. Pre-syncopal spells with pacemaker, currently not pacer-dependent      but pacing most of the time.  Follow up with Dr. Graciela Husbands.  I suspect      she will have probably 2 more years left of battery life.  No      indication for AICD.  4. Hypothyroidism.  Continue Synthroid.  Check TSH in 6 months.  5. History of paroxysmal atrial fibrillation with atrial arrhythmias.      Currently in sinus rhythm.  Continue Coumadin as directed given      this and her history of transient ischemic attacks with Carylon Perches-      Danlos syndrome and previous carotid dissection.  6. Depression.  Follow up with Dr. Luiz Blare.  Continue Lexapro at      current dose.   Overall, Amy Lane is doing well.  Her husband, Amy Lane, is restoring a  Mustang, and hopefully this will be done next year.  I told her to say  hello.  She will have a stress test and an echo.  So long as these are  low risk, I will see her back in 6 months.     [name]. [name], MD, [facility]  Electronically Signed    PCN/MedQ  DD: 12/03/2006  DT: 12/03/2006  Job #: [id number]
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
## 2010-06-07 NOTE — Assessment & Plan Note (Signed)
OFFICE VISIT   Amy Lane, Amy Lane  DOB:  [birth year]                                       06/21/2006  ZOXWR#:[id number]   The patient has a history of left carotid endarterectomy carried out  December 07, 2003 for ulcerated left internal carotid artery plaque with  some questionable symptoms.  She is followed regularly in the office  with carotid Doppler evaluation.  Presents today with new changes on the  right side.  She does have minimal plaque once again, however, there is  a question of a possible free floating thrombus in the right carotid  bulb.   She has Ehlers-Danlos syndrome.   Current medications include Coumadin, aspirin 81 mg daily, Lipitor 20 mg  daily, lorazepam q.h.s., Sectral 200 mg 2 tablets b.i.d., Synthroid 0.88  mg daily and Proamatine 5 mg 2 tablets daily.   She is known to be allergic to all prolonged QT drugs.   The patient has noted recently some episodes of dizziness, which are  similar to the symptoms she had with the acute presentation of her left  carotid problems.   EVALUATION:  She appears well.  BP 118/76, pulse 78 per minute, regular,  respirations 16 per minute.   There are no audible bruits.  Cranial nerves are intact.  Strength equal  bilaterally.   I am concerned enough about this that I do think that the patient should  be admitted to hospital at this time, reversal of Coumadin and plan for  a carotid arteriogram tomorrow.  Potential risks of the arteriogram  discussed with the patient including a 1% major complication rate to  include stroke.   Balinda Quails, M.D.  Electronically Signed   PGH/MEDQ  D:  06/21/2006  T:  06/21/2006  Job:  [id number]   cc:   [name]. [name], MD, [facility]
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
## 2010-06-07 NOTE — Assessment & Plan Note (Signed)
[facility]                         GASTROENTEROLOGY OFFICE NOTE   ANNY, SAYLER                    MRN:          [id number]  DATE:06/06/2006                            DOB:          10-12-1950    Ms. Baum is a very nice 60 year old white female who is referred for  consideration of screening colonoscopy.  She has a history of Ehlers-  Danlos syndrome with history of sick sinus syndrome, status post  pacemaker implantation and chronic atrial fibrillation; she also had a  left carotid endarterectomy and history of TIAs, first one at age 80.  She has been on chronic Coumadin therapy, being followed at [facility]in  Clinic by Dr. Shelby Dubin.  She has, on previous occasions, transferred  to Lovenox injections prior to her surgical procedures, such as carotid  endarterectomy.  She denies any lower GI symptoms, lower abdominal pain.  There is no family history of colon cancer.  She has never had a  colonoscopy.   MEDICATIONS:  1. Synthroid 0.88 mg daily.  2. Sectral 200 mg b.i.d.  3. Lorazepam 1 mg p.o. nightly.  4. Coumadin as directed.  5. Aspirin 81 mg p.o. daily.  6. ProAmatine 5 mg p.o. daily.  7. Lipitor 20 mg p.o. daily.  8. Calcium fiber capsule daily.   PAST HISTORY:  1. Dysautonomia.  2. Pacemaker implantation.  3. Long QT syndrome.  4. Carotid endarterectomy.  5. Cardiac ablation by Dr. Graciela Husbands.  6. Several cardioversions.  7. History of arthritis.  8. Thyroid problems.  9. TIAs.  10.Low blood pressure.  11.Depression.  12.Allergies.   OPERATIONS:  1. Tubal ligation.  2. Heart valve repair/mitral valve repair at [facility].   FAMILY HISTORY:  Positive for colon polyps in 2 of her sisters.  Heart  disease in mother, lung cancer in mother.  Diabetes in father,  __________  in father.   SOCIAL HISTORY:  Married, no children.  Works with [employer].  She  does not smoke, does not drink alcohol.   REVIEW OF  SYSTEMS:  Positive for allergies.  Weight stable.  Sleeping  problems, vision changes, heart rhythm problems.   PHYSICAL EXAMINATION:  Blood pressure 110/70, pulse 60, and weight 138  pounds.  She was slim, alert, oriented, in no distress.  LUNGS:  Clear to auscultation.  COR:  With regular rhythm, valve click, no murmur.  ABDOMEN:  Soft, relaxed, nontender with normoactive bowel sounds.  RECTAL:  Exam not done.  EXTREMITIES:  No edema.   Last laboratory data on May 11, 2006 showed a hemoglobin of 14.0,  hematocrit 40.5.  Last INR was 3.5.   IMPRESSION:  11. A 60 year old white female with Ehlers-Danlos syndrome, pacemaker,      chronic atrial fibrillation, and a mitral valve repair.  She is a      high risk for conscious sedation as well as for screening      colonoscopy.  2. Positive family history of colon polyps in 2 of her sisters.  The      patient remains asymptomatic as far as gastrointestinal complaints  are concerned.  3. Chronic anticoagulation with Coumadin.   PLAN:  I think patient would be a suitable candidate for colonoscopy if  we follow certain precautions.  First of all she should be done in the  hospital as an outpatient.  We need to have her receive SB prophylaxis  prior to the procedure which would be 1-1/2 gram Unasyn intravenous.  Patient will be also switched from Coumadin to Lovenox.  I have talked  to Shelby Dubin about it and she will help [place] to facilitate it in case we  need to use a __________  we may take precautions as far as her  pacemaker is concerned.  The patient agrees with the plan.  She will  have a routine colonoscopy prep, she will have her husband take her  home.  We had talked about conscious sedation, as well as the procedure  itself, as well as the prep.  The patient is to see Dr. [name] tomorrow  for routine cardiology followup.     Hedwig Morton. Juanda Chance, MD     DMB/MedQ  DD: 06/06/2006  DT: 06/06/2006  Job #: [id number]   cc:   Lonzo Cloud. Kriste Basque, MD  [name] [name], MD, [facility]  Shelby Dubin, PharmD, BCPS, CPP

## 2010-06-07 NOTE — Assessment & Plan Note (Signed)
[facility]                         ELECTROPHYSIOLOGY OFFICE NOTE   NAME:BOWDENMckenzy, Salazar                    MRN:          [id number]  DATE:04/24/2007                            DOB:          10-Sep-1950    I last saw her about a month ago, at which time we stopped her  ProAmatine and decreased her Sectral.   She was seen emergently in the clinic on Monday because of an episode  that occurred while driving.  She became suddenly short of breath and  felt a weakness, particularly in the upper part of her body.  She did  not have a sensation of presyncope.  There was no accompanying nausea.  With this, which occurred while she was talking her husband on the  telephone, she was unable to continue her conversation.  His  recollections of the conversation were such that there was no evidence  of garbled speech, inability to use or express words, although it is her  recollection and perhaps his that there may have been occasional letter  switch.  She showed up in the office here within 5 minutes, said she was  right on [place].  Interrogation of her device demonstrated that there  had been recurrent episodes of rapid sensing of atrial and ventricular  rates in the 120-140 range for a couple of hours prior to this event.   She also has a history of DVTs  and, unfortunately, we will have volume  4, and I do not know the details of this.  In the 2 weeks prior to this  event she had been struggling to get her INR up from the subtherapeutic  range, which is in the 1.4 range.  Her INRs are run high in the 2.5 to  3.5 range for reasons that are not evident to me.  She does have a  history of recurrent TIAs.  When she last saw Dr. Anne Hahn, her  recollection of his comment to her was that there is nothing else that  they could do and that she should go to the emergency room if she were  to have more neurological symptoms.   Her current medications include:  1.  The Synthroid at 88 mcg.  2. Coumadin.  3. Aspirin.  4. Lipitor 20 mg.  5. Sectral once a day.  6. ProAmatine resumed at 5 mg five a day.   On examination today, her blood pressure was 126/88, recheck was 118/78.  Her pulse was 79 or so and irregular in a pattern of trigeminy.  Her neck veins were flat.  Her carotids were brisk.  LUNGS:  Clear.  BACK:  Without kyphosis or scoliosis.  Her heart sounds were irregular as noted with a 2/6 murmur.  ABDOMEN:  Soft.  EXTREMITIES:  Without edema.  NEUROLOGIC:  Grossly normal.  SKIN:  Warm and dry.   Electrocardiogram dated today demonstrated sinus rhythm with frequent  PACs that were inferred by tracking.   The other events are as noted previously.   IMPRESSION:  1. Transient event characterized by shortness of breath, difficulty      speaking and  a sense of weakness.  2. Transient arrhythmia with A's and V's greater than 120 beats per      minute for some minutes or hours prior to the aforementioned event.  3. History of deep vein thrombosis with a recently subtherapeutic INR.  4. Mitral valve regurgitation, status post repair.  5. Bradycardia, status post pacemaker.  6. Long QT.  7. Dysautonomia with recent reinitiation of her ProAmatine and      decrease in her Sectral.   Mrs. Zelada has an event that the cause of which is unclear.  I doubt  that it was a neurological event, notwithstanding the concerns that she  had about her speech based on the fact that it was primarily a  respiratory event and that her husband did not describe any significant  changes in her speech.  However, with her history of neurological  events, a repeat assessment by Dr. Anne Hahn would be appropriate.   Given her subtherapeutic INR, I wonder whether she could not have had a  pulmonary embolism, and we will obtain venous Dopplers and a CT scan to  look at that.   Furthermore, given the arrhythmia that she had, we will (have done)  reprogram the device  so as to look at atrial and ventricular high-rate  events.   There is no evidence that this represents a more malignant arrhythmia.   We will plan to see her again as previously scheduled and we will obtain  the aforementioned testing as noted.     Duke Salvia, MD, [facility]  Electronically Signed    SCK/MedQ  DD: 04/24/2007  DT: 04/24/2007  Job #: [id number]

## 2010-06-07 NOTE — Op Note (Signed)
[name], Amy Lane             ACCOUNT NO.:  [id number]   MEDICAL RECORD NO.:  [id number]          PATIENT TYPE:  INP   LOCATION:  6735                         FACILITY:  [facility]   PHYSICIAN:  Balinda Quails, M.D.    DATE OF BIRTH:  05/11/1950   DATE OF PROCEDURE:  06/22/2006  DATE OF DISCHARGE:                               OPERATIVE REPORT   PHYSICIAN:  Balinda Quails MD.   DIAGNOSIS:  Right carotid stenosis with possible free-floating thrombus.   PROCEDURES:  1. Arch aortogram.  2. Selective right carotid arteriogram.   CLINICAL NOTE:  Amy Lane is a 60 year old female with known  Ehlers-Danlos syndrome and prolonged QT syndrome.  She has previously  undergone a left carotid endarterectomy.  She was seen in the office  yesterday with new changes at a right carotid bifurcation, there  appeared to be some free-floating thrombus by duplex, and she also did  describe some new onset of symptoms of lightheadedness and dizziness.  Brought to the cath lab at this time after reversal of Coumadin for a  carotid arteriogram.   PROCEDURE NOTE:  The patient brought to the cath lab in stable  condition.  Placed in supine position.  The right groin prepped and  draped in sterile fashion.  Skin and subcutaneous tissues instilled with  1% Xylocaine.  A  needle introduced in the right common femoral artery.  A 0.035 Wholey guidewire through the needle into the mid abdominal  aorta.  A 5-French sheath advanced over the guidewire.  Pigtail catheter  advanced over the guidewire.  Both the guide wire and catheter were  advanced into the aortic arch.  Pigtail catheter positioned in the  distal ascending aorta.  A 40 degree LAO projection arch aortogram  obtained.  This revealed widely patent innominate, left common carotid  and left subclavian arteries.  Antegrade vertebral flow noted  bilaterally.  The proximal subclavian arteries were widely patent.   Guidewire inserted and a catheter  exchange made for an H1 catheter.  The  H1 catheter engaged into the innominate artery.  A RAO projection  innominate arteriogram obtained.  This revealed a widely patent origin  of the right subclavian and right common carotid arteries.  Antegrade  vertebral flow.  Normal appearing right internal mammary artery.   The guide wire was then advanced up into the right common carotid  artery.  Right carotid arteriography obtained with cervical and  intracranial views.  Cervical images revealed posterior plaque.  There  was no evidence of free-floating thrombus.  AP, lateral and oblique  images were obtained.   There was posterior plaque present with a mild stenosis estimated to be  less than 30%.   The patient tolerated the procedure well.  The catheter was removed.  Right femoral sheath was removed.  The patient transferred to the  holding area in stable condition.   FINAL IMPRESSION:  1. Normal aortic arch.  2. Mild right carotid bifurcation plaque with shallow ulceration and      no significant internal carotid artery stenosis   DISPOSITION:  These results have been  reviewed with the patient and  family.  The patient will restart her Coumadin, plan discharge today.  She is going to follow up in the office in three months with repeat  carotid Doppler evaluation.      Balinda Quails, M.D.  Electronically Signed     PGH/MEDQ  D:  06/22/2006  T:  06/22/2006  Job:  [id number]   cc:   [name]. [name], MD, [facility]

## 2010-06-07 NOTE — Assessment & Plan Note (Signed)
OFFICE VISIT   Amy Lane, Amy Lane  DOB:  08/06/1950                                       10/04/2006  NUUVO#:[id number]   Patient return to the office today with a follow-up carotid Doppler  evaluation.  This is carried out at the [facility] vascular lab.  There  does continue to be an area of mobile, linear striations in the distal  right common carotid artery.  This is just proximal to the bifurcation.  Mild soft plaque is noted bilaterally without significant ICA stenosis.   Patient has remained free of symptoms.  No vertigo or unsteadiness.  No  syncope or presyncope.  Denies visual disturbances.  No motor or sensory  deficits.   She is status post left carotid endarterectomy, carried out on  12/07/2003.   Arteriography carried out on 06/22/2006 failed to reveal any evidence of  free-floating thrombus, right carotid bulb.  There was a posterior  plaque with mild ulceration.   PHYSICAL EXAMINATION:  Today, patient appears well.  BP 119/80 in the  left arm, 137/74, right arm, pulse 80 per minute and regular,  respirations 18 per minute.  She has no carotid bruits.  Cranial nerves  are intact.  Strength is equal bilaterally.   I do think a continued conservative approach is warranted at this time.  She will remain on Coumadin and low-dose aspirin.  A follow-up carotid  Doppler will be arranged for her at the [facility] lab in six months, and I  will plan to see her at this time.   Balinda Quails, M.D.  Electronically Signed   PGH/MEDQ  D:  10/04/2006  T:  10/05/2006  Job:  [id number]   cc:   [name]. [name], MD, [facility]

## 2010-06-07 NOTE — Assessment & Plan Note (Signed)
[facility]                         ELECTROPHYSIOLOGY OFFICE NOTE   JAMILE, REKOWSKI                    MRN:          [id number]  DATE:12/13/2007                            DOB:          April 14, 1950    Ms. Heckert comes in feeling better since the weather cooled off.  She  had a very difficult time this summer with fatigue and lightheadedness.  We were also concerned before whether the spells of weakness and  shortness of breath were related to arrhythmias.  We were unable to  correlate them.  We tried her on different beta-blockers, metoprolol,  simvastatin, atenolol, and Inderal; none of which she tolerated and all  of which were associated with more fatigue   Review of the electrogram is described below.   She also had an episode while traveling of peripheral edema, not  surprisingly.  There is a little bit of pain in her leg.  I should note  that her INR today apparently is 5 or so.   Her medications currently include Coumadin, aspirin, Lipitor, Sectral  200 nightly (once a day?), Lorazepam, Synthroid.   On examination, her blood pressure was 90/62, her pulse was 76.  Her  lungs were clear.  Extremities were without edema.   Interrogation of her pacemaker demonstrated 2 interesting findings on  the ventricular high rate counters, one was again atrial tachycardias  driving ventricular rates.  There is then a presumably heart rate  slowing atrial paced event followed by a ventricular paced event which  either captured or did not capture, it was followed by a V-sensed event.  The APVP interval on one occasion was 110 msec.  The APVP interval on  the other was 215 msec, both of which were followed by a ventricular  sensed event representing either probably a T-wave or ventricular fail  to capture.   I will review the stress with [employer]tr[employer].  In the interim, I have  reprogrammed her ventricular sensitivity from 2.8 to 4 and her atrial  sensitivity from 0.5 to 0.7.   IMPRESSION:  1. Dysautonomia with heat intolerance and generalized fatigue.  2. Recurrent episodes of atrial tachycardia.  3. Ventricular paced events followed by ventricular sensed events on 2      occasions, both following paced events occurring in the wake of a      series of AR events, question cause.--i have reviewed these with      [employer]--an explanation was given, but I dont recall it presently      except that it was benign   We will plan to review the strips again in a couple of weeks, and I will  review the aforementioned electrogram with [employer].     Duke Salvia, MD, [facility]  Electronically Signed    SCK/MedQ  DD: 12/13/2007  DT: 12/14/2007  Job #: [id number]

## 2010-06-07 NOTE — Assessment & Plan Note (Signed)
[facility]                            CARDIOLOGY OFFICE NOTE   LUCY, BOARDMAN                    MRN:          [id number]  DATE:05/31/2007                            DOB:          [birth year]    Venita is seen today in followup.   She has a history of dysautonomia with mitral valve prolapse, sick sinus  syndrome with pacemaker present.  She has had some chronic problems with  low blood pressure and had been on ProAmatine.  Recently she saw Dr.  Myrtis Ser and Dr. Graciela Husbands.  She had an episode of shortness of breath and  weakness while her INR was low.  CT scan showed no evidence of PE.  She  had a colonoscopy done in January and since that time her INRs have been  a little difficult to control.  Fortunately her colonoscopy was fine.   The patient subsequently had a question of a TIA type event with some  difficulty speaking.  Again her INR was a little bit low.  This has  seemed to clear up and she has not had any recurrent sequelae.   The patient has not had significant palpitations.  There has been no  PND, orthopnea and no chest pain.  She is due to have her carotids  followed up in February of 2010.  She had a spontaneous carotid  dissection requiring endarterectomy.   REVIEW OF SYSTEMS:  Otherwise negative.   MEDICATIONS:  1. Include Synthroid 88 mcg a day.  2. Coumadin as directed.  3. Aspirin daily.  4. Lipitor 20 a day.  5. Sectral once a day.  6. ProAmatine p.r.n.  7. She also takes lorazepam 1 mg daily at bedtime.   PHYSICAL EXAMINATION:  GENERAL:  Her exam is remarkable for a thin  aesthetic white female in no distress.  VITAL SIGNS:  Her blood pressure is 90/70 standing.  She did get a hot  flash after standing for 5 minutes.  Pulse is 70 and regular, afebrile,  respiratory rate 16, weight is 137.  HEENT:  Unremarkable.  She is status post left CEA.  No residual bruit.  No lymphadenopathy or thyromegaly or JVP elevation.  LUNGS:  Clear with good diaphragmatic motion.  No wheezing.  Pacer in  good position on the left clavicle.  HEART:  S1, S2 with normal heart sounds.  PMI normal.  ABDOMEN:  Benign.  Bowel sounds positive.  No AAA, no tenderness, no  hepatosplenomegaly, no hepatojugular reflux.  Distal pulses are intact  with no edema.  NEURO:  Nonfocal.  SKIN:  Warm and dry.  No muscle weakness.   IMPRESSION:  1. History of MVP (mitral valve prolapse) with Ehlers-Danlos syndrome      status post mitral valve repair with ring currently stable.      Followup echo in a year.  2. Ehlers-Danlos syndrome with dissected left carotid artery.      Followup duplex February 2010.  Despite her recent TIA her      carotids looked fine with no residual intimal dissection.  Continue      aspirin and  Coumadin.  3. History of pacemaker therapy with dysautonomia.  Pacemaker seems to      be functioning fine.  Rhythm normal.  No evidence of significant      atrial fibrillation.  4. Chronic insomnia, apparently on lorazepam 1 mg for over 10 years.      Will try Lunesta 2 mg a day.  The patient seemed interested in      doing this and apparently Dr. Kriste Basque did not want to change her.      She knows she can go back to lorazepam if the Alfonso Patten is not      effective.  5. Hypothyroidism.  Continue replacement.  TSH and T4 in 6 months.   Overall I think Saumya is stable.  I will see her back in 6 months.  She has a slew of somewhat interesting and uncommon problems but seems  to be getting along better.     [name]. [name], MD, [facility]  Electronically Signed    PCN/MedQ  DD: 05/31/2007  DT: 05/31/2007  Job #: [id number]

## 2010-06-07 NOTE — Assessment & Plan Note (Signed)
[facility]                         GASTROENTEROLOGY OFFICE NOTE   JOMARIE, GELLIS                    MRN:          [id number]  DATE:02/18/2007                            DOB:          [birth year]    PRIMARY CARE PHYSICIAN:  Dr. Alroy Dust.   PRIMARY CARDIOLOGIST:  Dr. [name].   She is also seen by Shelby Dubin, PharmD, [facility].   GI PROBLEM LIST:  Elevated risk for colorectal polyps given family  history of colon polyps.  Never had colonoscopy.   HISTORY:  Ms. Matusik was last seen here by Dr. Lina Sar in May of  2008.  She was to be arranged to have a colonoscopy, taking into account  some of her significant medical conditions.  She was to be bridged off  of Coumadin with Lovenox, and she would be given SBE prophylaxis.  She  has a pacemaker in place, and she was to be arranged to have the  pacemaker rep there in case of need.  Shortly after that she has decided  to change caregivers from Dr. Juanda Chance to myself.  I did a colonoscopy for  her husband as well as her brother-in-law, and she was there for some of  those interactions and has wished to change.   CURRENT MEDICINES:  1. Synthroid.  2. Sectral.  3. Lorazepam.  4. Coumadin.  5. Aspirin.  6. Lipitor.  7. Calcium.  8. __________  9. Lexapro.   PHYSICAL EXAMINATION:  Weight 141 pounds, blood pressure 110/72, pulse  64.  CONSTITUTIONAL:  Generally well appearing.  NEUROLOGIC:  Alert and oriented x3.  EYES:  Extraocular movements intact.  MOUTH:  Oropharynx moist, no lesions.  NECK:  Supple, normal lymphadenopathy.  CARDIOVASCULAR:  Heart regular rate and rhythm.  LUNGS:  Clear to auscultation bilaterally.  ABDOMEN:  Soft, nontender, nondistended, normal bowel sounds.  EXTREMITIES:  No lower extremity edema.   ASSESSMENT AND PLAN:  A 60 year old woman with family history of colon  polyps.   I will arrange for her to have a colonoscopy performed at her  soonest  convenience.  She wants to wait until after her next visit with her  electrophysiologist, Dr. Graciela Husbands, which seems reasonable to me, so we will  schedule her for a colonoscopy in mid to late February.  She will stop  Coumadin and be bridged using Lovenox with the help of Shelby Dubin.  We  will also arrange for the rep for her pacemaker to be around in case it  needs to be stopped.  I see no return for any further blood tests or  imaging studies prior to then.     Rachael Fee, MD  Electronically Signed    DPJ/MedQ  DD: 02/18/2007  DT: 02/18/2007  Job #: [id number]   cc:   Duke Salvia, MD, [facility]  Lonzo Cloud. Kriste Basque, MD  [name] [name], MD, [facility]  Shelby Dubin, PharmD, BCPS, CPP

## 2010-06-07 NOTE — Consult Note (Signed)
NAMEDORTHIE, SANTINI NO.:  [id number]   MEDICAL RECORD NO.:  [id number]          PATIENT TYPE:  INP   LOCATION:  6735                         FACILITY:  [facility]   PHYSICIAN:  Marin Roberts, MDDATE OF BIRTH:  [birth year]   DATE OF CONSULTATION:  DATE OF DISCHARGE:  06/22/2006                                 CONSULTATION   REFERRING PHYSICIAN:  Balinda Quails, M.D.   REASON FOR CONSULTATION:  Interpretation of intracranial portion of  carotid arteriogram.   The patient underwent right carotid arteriogram on Jun 22, 2006.  AP  lateral and oblique imaging was performed during intracranial runs.  This demonstrates no focal stenosis, aneurysm or branch vessel  occlusions.  The dural sinus is normally filled.  There is some cross-  filling of the anterior communicating artery.  Additionally, the right  posterior communicating artery fills with flash-filling of the right  posterior cerebral artery.   IMPRESSION:  Normal intracranial imaging from right common carotid  injection.      Marin Roberts, MD  Electronically Signed     CM/MEDQ  D:  07/02/2006  T:  07/02/2006  Job:  [id number]

## 2010-06-07 NOTE — Assessment & Plan Note (Signed)
[facility]                            CARDIOLOGY OFFICE NOTE   Amy, Lane                    MRN:          [id number]  DATE:12/04/2007                            DOB:          1951-01-11    Amy Lane returns today for followup.  She is a complicated patient with  mitral valve prolapse, status post mitral valve repair, dysautonomia,  status post pacemaker placement, history of Ehlers-Danlos syndrome with  a dissected left carotid artery on chronic antiplatelet therapy.  She  has been doing fairly well.  She had a bit of a rough go after she had a  trip to [place].  She went there to a [employer] with her  husband.  There were looking at [employer] with a special Geophysicist/field seismologist.   After a long plane ride, she had severe pain in her leg.  The risk of PE  is low since she is on therapeutic Coumadin.  However, she apparently  had some difficulty getting through with our office.  She eventually had  resolution of the pain and swelling in her right leg and it has  currently gone.  I think the clinical suspicion for PE would be low.  She may have strained a muscle during her trip, but I do not think that  it needs further workup at this time.  Otherwise, she has been doing  well.  She has not had any presyncope.  Her pacemaker seems to be  working well.  She has not had any rapid palpitations.  She has not had  any recurrent TIAs.   She follows up with Dr. Graciela Husbands for her pacemaker checks.  Her last echo  done on December 11, 2006, showed that her mitral valve repair is  functioning normally with normal EF.   We will have to look back through her chart to see when her last carotid  duplex was, but this has been followed for intimal hyperplasia and  dissection and it has been normal without recurrent symptoms.  However,  given Ehlers-Danlos syndrome.  She will be on chronic aspirin and  Coumadin.   CURRENT MEDICATIONS:  1.  Lorazepam 1 mg a day.  2. Coumadin as directed.  3. Baby aspirin a day.  4. Lipitor 20 a day.  5. Sectral 200 a day.  6. Calcium.  7. Synthroid 75 mcg a day.  8. Fish oil.   PHYSICAL EXAMINATION:  GENERAL:  Remarkable for a thin aesthetic female  in no distress.  VITAL SIGNS:  Weight is 134, blood pressure 104/64, not postural, pulse  76 and regular, respiratory rate 14, afebrile.  HEENT:  Unremarkable.  NECK:  Carotids have no bruit.  No lymphadenopathy, thyromegaly, or JVP  elevation.  LUNGS:  Clear with good diaphragmatic motion.  No wheezing.  Pacer in  the left clavicle in good position.  HEART:  S1 and S2 without an MR murmur.  PMI normal.  ABDOMEN:  Benign.  Bowel sounds positive.  No AAA.  No tenderness.  No  bruit.  No hepatosplenomegaly, hepatojugular reflux, or tenderness.  EXTREMITIES:  Distal  pulses are intact.  No edema.  NEURO:  Nonfocal.  SKIN:  Warm and dry.  MUSCULOSKELETAL:  No muscular weakness.   She does have a left CEA scar in her neck.   IMPRESSION:  1. Ehlers-Danlos syndrome with previous dissection of the left      carotid.  No residual bruit.  Follow up carotid duplex per      recommendations.  We will look back in see when her 43-month      followup is due.  Continue aspirin therapy.  2. History of paroxysmal atrial fibrillation with chronic Coumadin      anticoagulation.  Follow up in the Coumadin clinic.  Currently      maintaining sinus rhythm.  3. Dysautonomia.  Follow up with Dr. Graciela Husbands, currently symptoms stable.      They tend to improve during the cold weather months.  The patient      will continue her current dose of sectoral.  4. History of mitral valve prolapse with repair, currently functioning      normally by echo.  Continue subacute bacterial endocarditis      prophylaxis.  5. Hyperlipidemia in the setting of vascular disease.  Continue      Lipitor.  Lipid and liver profile in 6 months.  6. Hypothyroidism.  Continue Synthroid  replacement.  TSH and T4 in 6      months.  Overall, I think Amy Lane is doing well and I enjoyed      talking to her and husband today.     [name]. [name], MD, [facility]  Electronically Signed    PCN/MedQ  DD: 12/04/2007  DT: 12/05/2007  Job #: [id number]

## 2010-06-07 NOTE — Assessment & Plan Note (Signed)
[facility]                         ELECTROPHYSIOLOGY OFFICE NOTE   Amy Lane, Amy Lane                    MRN:          [id number]  DATE:03/05/2007                            DOB:          26-Aug-1950    Amy Lane returns with her husband.  To say the least, I was  surprised.  At our last visit there have been a long discussion as to  whether to proceed with further evaluation for long QT syndrome and ICD  implantation and it had been my thought at that time that that had been  looked at adequately but at that point offered them alternative  consultation in that regard.  I did not hear from them and I assumed  that that had all taken place.   The long and short of it is that they chose not to pursue that.  She  says she is doing really quite well.  She has some palpitations in the  evening.  She is not having any dizziness.   CURRENT MEDICATIONS:  1. Synthroid.  2. Sectral was started back at the time her pacemaker was implanted.  3. Lorazepam, which she is trying to get off of.  4. Lipitor.  5. Primatene recently down titrated from 10 to 5.  6. Her Lexapro for depression has intercurrently been discontinued.   PHYSICAL EXAMINATION:  VITAL SIGNS:  Her blood pressure is 115/69 and  pulse was 75.  LUNGS:  Clear.  HEART:  Heart sounds were regular.   Interrogation of her pulse generator demonstrated a [employer]onic Kappa  device with a P-wave of 4,impedance of 444, threshold 0.75 at 0.4 with  the R-wave of 11.2 with impedance of 615 and a threshold of 1 volt at  0.4.  Battery voltage 2.74.  Looking at her heart rate histogram, there  is a little bit of a bump which I was surprised at in the 100-130 beat  per minute bend which is driven mostly by her pulse generator.  There is  no evidence of atrial fibrillation.   IMPRESSION:  1. Tachybrady syndrome.  2. Status post pacer for the above.  3. Long QT syndrome.  4. Syncope thought to be  neurally mediated.  5. __________ syndrome with dysautonomia.  6. Transient neurological episode status post carotid endarterectomy.   Amy Lane is doing quite well.  We are going to go ahead and stop her  ProAmatine as she is having no orthostatic dizziness and decrease her  Sectral.  This may help her fatigue, though I suspect her fatigue is  much more multifactorial than that.   We will see her again in one year's time.     Duke Salvia, MD, [facility]  Electronically Signed    SCK/MedQ  DD: 03/05/2007  DT: 03/06/2007  Job #: [id number]

## 2010-06-08 ENCOUNTER — Ambulatory Visit (INDEPENDENT_AMBULATORY_CARE_PROVIDER_SITE_OTHER): Payer: No Typology Code available for payment source | Admitting: *Deleted

## 2010-06-08 DIAGNOSIS — G459 Transient cerebral ischemic attack, unspecified: Secondary | ICD-10-CM

## 2010-06-08 DIAGNOSIS — I4891 Unspecified atrial fibrillation: Secondary | ICD-10-CM

## 2010-06-08 LAB — POCT INR: INR: 2.5

## 2010-06-10 NOTE — Discharge Summary (Signed)
NAME:  Amy Lane, Amy Lane                       ACCOUNT NO.:  [id number]   MEDICAL RECORD NO.:  [id number]                   PATIENT TYPE:  INP   LOCATION:  3734                                 FACILITY:  [facility]   PHYSICIAN:  Duke Salvia, M.D. [facility]           DATE OF BIRTH:  1950/05/23   DATE OF ADMISSION:  12/14/2001  DATE OF DISCHARGE:  12/19/2001                           DISCHARGE SUMMARY - REFERRING   PROCEDURES:  1. Transesophageal electrocardiogram, Direct current cardioversion     12/16/2001.  2. Pacemaker interrogation.   REASON FOR ADMISSION:  The patient is a delightful 60 year old female, a  patient of Dr. Sherryl Manges and Dr. [name], with well-known history of  dysrhythmia status post a prior radiofrequency ablation and permanent  pacemaker implantation as well as mitral valve repair at [facility],  who presented with recurrent atrial flutter with variable block.  Following  recent followup in the office with Dr. Graciela Husbands, arrangements were made for  elective hospitalization for direct current cardioversion.  Please refer to  dictated admission note for full details.   LABORATORY DATA:  Stable hemoglobin and hematocrit with hemoglobin 10.5 at  discharge.  INR 1.4 on admission, 3.3 (x 2) prior to discharge.  Normal  metabolic profile.  TSH 9.57 with normal T4 at 6.5 and normal T3 at 92.   Admission chest x-ray: No acute disease.   HOSPITAL COURSE:  The patient presented for elective direct current  cardioversion but was subtherapeutic on admission (INR 1.4).  Therefore, she  was started on intravenous heparin, and Coumadin was titrated.   The patient was then referred for transesophageal echocardiogram-guided DC  cardioversion performed November 24 by Dr. [name].  (See operative report  for full details).  This revealed evidence of mitral valve repair with  trivial MR, normal left and right ventricle, and no evidence of a left  atrial appendage clot.   Given this, Dr. [name] proceeded with successful DC  cardioversion with application of 100 and 200 joules and restoration of  normal sinus rhythm.  The patient maintained this rhythm by time of  discharge three days later.  The patient was maintained on intravenous  heparin overlap with therapeutic INR (3.3, 3.3) prior to discharge.   Pacemaker was also interrogated during this hospitalization with no noted  adjustments.   Of note, Dr. [name] noted the mildly elevated TSH and recommended repeat TSH  and followup with patient's primary care physician, Dr. Alroy Dust.   FINAL RECOMMENDATIONS:  The patient is to proceed with a followup pro time  check the morning following discharge, November 28, at the [facility].  She is to remain on Coumadin 5 mg q.d.   DISCHARGE MEDICATIONS:  1. Coumadin 5 mg q.d.  2. Acebutolol 200 mg b.i.d.  3. Synthroid 0.1 mg q.d.  4. ProAmatine 10 mg 3 to 5 times daily as directed.  5. Ativan 1 mg q.h.s.   DISCHARGE  INSTRUCTIONS:  The patient is instructed to have a followup pro  time check at the [facility] on Friday, November 28.   The patient is instructed to follow up with Dr. Sherryl Manges in  approximately two weeks.  Arrangements will be made through our office.   DISCHARGE DIAGNOSES:  1. Recurrent atrial flutter with variable block status post successful     transesophageal electrocardiographic direct current cardioversion     November 24.  2. History of paroxysmal atrial fibrillation and flutter status post     radiofrequency ablation.  3. Status post permanent pacemaker implantation and status post recent lead     extraction and pacemaker replacement at [facility].  4. Chronic Coumadin.  5. Status post mitral valve repair at [facility].  6. Ehlers-Danlos syndrome.  7. Treated hypothyroidism.  8. History of stroke     Gene Serpe, P.A. [facility]                      Duke Salvia, M.D. [facility]    GS/MEDQ  D:   12/19/2001  T:  12/19/2001  Job:  [id number]

## 2010-06-10 NOTE — H&P (Signed)
NAME:  Amy Lane, Amy Lane                       ACCOUNT NO.:  [id number]   MEDICAL RECORD NO.:  [id number]                   PATIENT TYPE:  INP   LOCATION:  3734                                 FACILITY:  [facility]   PHYSICIAN:  Salvadore Farber, M.D. [facility]         DATE OF BIRTH:  01-05-1951   DATE OF ADMISSION:  12/14/2001  DATE OF DISCHARGE:                                HISTORY & PHYSICAL   CHIEF COMPLAINT:  Atrial flutter.   HISTORY OF PRESENT ILLNESS:  The patient is a very pleasant 60 year old  female with past medical history as below, who states that she is here for  an elective admission for anticoagulation prior to a TEE/DC cardioversion on  December 16, 2001.  The patient saw Dr. Graciela Husbands on December 02, 2001, and it  was noted at that time that she was having intermittent palpitations and a  fair amount of atrial flutter on a Holter monitor.  When she presented for  routine labs on December 13, 2001, she states that she was called because  her Coumadin was subtherapeutic and she was scheduled for admission today.   PAST MEDICAL HISTORY:  1. Paroxysmal atrial fibrillation/atrial flutter.  The patient is status     post atrial flutter ablation at the [facility] on February 21, 2001,     and also a lead extraction at the [facility] on November 22, 2001.     The patient states that after the lead extraction that she has had more     trouble with atrial flutter.  2. Mitral valve repair at the [facility] on July 27, 2000.  3. Ehlers-Danlos.  4. TIA.  5. Hypothyroidism, currently on Synthroid.  6. Dysautonomia.  She has treated this for a long time with increased water     intake and sodium.  She is currently on ProAmatine, which she doses     t.i.d. to five times a day.  She did not tolerate Florinef in the past.  7. Long Q-T syndrome.   ALLERGIES:  ALL Q-T PROLONGING DRUGS.   MEDICATIONS:  1. Coumadin 5 mg q.h.s.  2. Acebutolol 200 mg b.i.d.  3. ProAmatine 5  mg 2 tablets t.i.d. to five times a day.  4. Synthroid, the patient states she takes 1 mg per day, although this may     be 0.1 mg, dose is uncertain.  5. Ativan 1 mg q.h.s.  6. Calcium 1200 daily  7. Vitamin D 400 daily   SOCIAL HISTORY:  The patient lives in [place] with her husband.  She  has no children.  No tobacco or no alcohol.   FAMILY HISTORY:  Multiple family members have had CAD and cancer.   REVIEW OF SYSTEMS:  All other review of systems are negative.   PHYSICAL EXAMINATION:  VITAL SIGNS:  Temperature 36.2, pulse 98,  respirations 20, blood pressure 115/82, she is satting 98% on room air.  GENERAL:  In  general she is a tall, thin white female with long extremities  who is in no apparent distress.  HEENT:  PERRLA, EOMI.  Oropharynx, oral cavity are clear.  Mucous membranes  are wet.  NECK:  Supple.  No masses.  No bruits. No JVD.  No lymphadenopathy.  CARDIOVASCULAR:  Regular rate and rhythm.  No S3 or S4.  No murmurs, rubs or  gallops.  Normal S1, S2.  LUNGS:  Clear to auscultation bilaterally with no wheezes, rales, or  rhonchi.  SKIN:  No rashes.  ABDOMEN:  Soft, nontender, nondistended.  Normal bowel sounds.  No  hepatosplenomegaly.  EXTREMITY:  No clubbing, cyanosis, or edema, 2-3+ pulses throughout.  NEUROLOGIC:  Examination is intact.   Chest x-ray is pending from the morning.  ECG shows atrial flutter with 3:1  block, rate of 89.  She has a right axis with a QRS axis of 163.  Her QRS  duration is 114 and a Q-Tc is 415.  No Qs and no ST or T wave changes.  Labs  are currently pending.   ASSESSMENT AND PLAN:  The patient is a 60 year old with atrial flutter who  is status post mitral valve replacement, pacemaker, atrial flutter ablation,  and recent lead extraction for pacemaker change out.  She presents for  anticoagulation prior to direct-current cardioversion and transesophageal  echocardiography.  She states that her recent INR was subtherapeutic.   1. Atrial flutter:  We will avoid antiarrhythmics.  The plan is for a     transesophageal echocardiography cardioversion on Monday.  We will give     her Lovenox subcutaneous b.i.d.  2. Ehlers-Danlos/dysautonomia:  Continue current plan.  3. Hypothyroidism:  Patient will take her own medicines as prescribed. We     will check a TSH, T3 and T4.  4. We will also check fasting lipids.  5. The patient will be n.p.o. on the night before procedure on Monday.            Heloise Beecham, M.D. [facility]                Salvadore Farber, M.D. [facility]    DWM/MEDQ  D:  12/14/2001  T:  12/15/2001  Job:  [id number]

## 2010-06-10 NOTE — H&P (Signed)
NAME:  Amy Lane, Amy Lane NO.:  [id number]   MEDICAL RECORD NO.:  [id number]                   PATIENT TYPE:  EMS   LOCATION:  MAJO                                 FACILITY:  [facility]   PHYSICIAN:  Jonelle Sidle, M.D. [facility]        DATE OF BIRTH:  21-Mar-1950   DATE OF ADMISSION:  04/16/2003  DATE OF DISCHARGE:                                HISTORY & PHYSICAL   PRIMARY CARE PHYSICIAN:  Dr. Lonzo Cloud. N[place].   [place] CARDIOLOGISTS:  Drs. [name] and Duke Salvia.   CHIEF COMPLAINT:  Progressive problems with dizziness.   HISTORY OF PRESENT ILLNESS:  Ms. Murtagh is a complex 60 year old woman with  a history of severe dysautonomia, Ehlers-Danlos syndrome, followed at the  [facility], long Q-T syndrome, history of conduction system disease,  status post pacemaker placement with subsequent ablation for atrial  arrhythmias, hypothyroidism, and previous stroke.  Based on available  records, she was evaluated back in November and December by Dr. [name] and  at that time, had a followup echocardiogram showing normal left ventricular  contraction with no significant mitral regurgitation and evidence of prior  annuloplasty ring repair.  She also had an adenosine Cardiolite that showed  an ejection fraction of 68% with no evidence of ischemia.  She has been  managed medically and has not had any major medication changes over the last  few months.  Typically, she does report episodic difficulty with dizziness  and presyncope.  Usually, she is able to attain a supine position for about  an hour and work through her symptoms.  She has had a constellation of  symptoms over the last several weeks including a 13-pound weight loss,  difficulties focusing her vision, nausea, the feeling of sweats and chills,  and more frequent episodes of dizziness.  Today, she had a very prolonged  episode and was unable to alleviate her symptoms by typical maneuvers.  She  called the [facility] Cardiology office and was referred to the emergency  department.  Her current medications are outlined below.  She denies any  clearly defined fevers, peripheral edema, palpitations, frank syncope, focal  weakness, shortness of breath or recent chest pain.   ALLERGIES:  Intolerance to FLORINEF.   CURRENT MEDICATIONS:  Current medications include:  1. ProAmatine 10 mg p.o. t.i.d.  2. Coumadin 7.5 mg p.o. daily, except for 10 mg on Wednesday.  3. Sectral 200 mg p.o. daily.  4. Wellbutrin XL 150 mg p.o. daily.  5. Ativan 1 mg p.o. nightly.  6. Synthroid 1 mg p.o. daily.   PAST MEDICAL HISTORY:  1. History of mitral valve repair at the [facility] in 2002.  This was     stable by recent echocardiogram in November of 2004 with no mitral     regurgitation and overall normal ejection fraction.  2. History of atrial dysrhythmias and sick sinus syndromes, status post     pacemaker placement as  well as ablation.  It sounds as if most of this     was accomplished at the [facility], but she has been followed by     Dr. Graciela Husbands as well.  She does tell me that she recently had her pacemaker     interrogated about 3 months ago and at that time, apparently it was set     up to act as an event recorder as best I can tell.  3. History of long Q-T syndrome.  4. Hypothyroidism.  5. COPD.  6. Previous history of stroke.  7. Ehlers-Danlos syndrome.   SOCIAL HISTORY:  The patient is married and lives in [place].  She has no  ongoing tobacco use history and drinks alcohol occasionally.   FAMILY HISTORY:  Family history is noncontributory at present.   REVIEW OF SYSTEMS:  Review of systems as described in history of present  illness.  Otherwise negative.   EXAM:  VITAL SIGNS:  Blood pressure is 134/74, heart rate is 78,  respirations 18, temperature is 97.5 degrees.  GENERAL:  This is a pale-appearing woman, seated, in no acute distress.  NECK:  Examination of the neck  reveals no elevated jugular venous distention  or carotid bruits.  No thyromegaly is noted.  LUNGS: Lungs are clear without labored breathing.  CARDIAC:  Exam reveals a regular rate and rhythm without S3 gallop.  EXTREMITIES:  Extremities show no pitting edema.  Peripheral pulses are 2+.  SKIN:  Skin is warm and dry.   LABORATORY AND ACCESSORY CLINICAL DATA:  Laboratory data and  electrocardiogram are currently pending.   IMPRESSION:  1. Recent constellation of symptoms as described.  Particularly problematic     has been recent dizziness that has been more intense than usual, as well     as weight loss, difficulty focusing visually and nausea.  There have been     no recent medication changes.  2. Known history of mitral valve repair, noted to be stable by     echocardiogram in November of 2004.  3. History of atrial dysrhythmias and sick sinus syndrome, status post     pacemaker placement.  This was last interrogated approximately 3 months     ago and it sounds as if it may have been set up to act as an event     recorder at that time.  I do not have any records in this regard.  4. Known history of long Q-T syndrome.  5. Ehlers-Danlos syndrome.  6. Remote history of stroke.  7. Hypothyroidism.   PLAN:  1. We will admit the patient overnight for observation and plan to provide     gentle hydration.  Orthostatic blood pressure will be checked as well.  2. We will obtain basic blood work including CBC with differential, BMET and     an erythrocyte sedimentation rate.  3. It would also be reasonable to consider interrogating the patient's     pacemaker to see if there have been any recent     increase in atrial dysrhythmias that may be precipitating symptoms.  I     can hopefully have Dr. Graciela Husbands evaluate the patient in the morning.  4. Further plans to follow.  Jonelle Sidle, M.D. [facility]   SGM/MEDQ  D:  04/16/2003  T:  04/17/2003   Job:  [id number]

## 2010-06-10 NOTE — Op Note (Signed)
   NAME:  Amy Lane, Amy Lane                       ACCOUNT NO.:  [id number]   MEDICAL RECORD NO.:  [id number]                   PATIENT TYPE:  OIB   LOCATION:  2899                                 FACILITY:  [facility]   PHYSICIAN:  Duke Salvia, M.D.               DATE OF BIRTH:  01-26-1950   DATE OF PROCEDURE:  06/04/2002  DATE OF DISCHARGE:  06/04/2002                                 OPERATIVE REPORT   PREOPERATIVE DIAGNOSIS:  Atrial flutter.   POSTOPERATIVE DIAGNOSIS:  Sinus rhythm (AV paced rhythm.   PROCEDURE PERFORMED:  DC cardioversion and pacemaker interrogation.   DESCRIPTION OF PROCEDURE:  Following obtaining informed consent, the patient  was brought to the holding area.  INR was 3.9 under the care of Dr. Jean Rosenthal.  The patient received 225 mg of intravenous pentothal.  A 100 joule shock was  delivered synchronously in atrial flutter, restoring sinus rhythm.  The  patient tolerated the procedure well without apparent complication.                                               Duke Salvia, M.D.    SCK/MEDQ  D:  06/04/2002  T:  06/05/2002  Job:  [id number]

## 2010-06-10 NOTE — Assessment & Plan Note (Signed)
[facility]                             PULMONARY OFFICE NOTE   Amy Lane, Amy Lane                    MRN:          [id number]  DATE:05/10/2006                            DOB:          Feb 14, 1950    HISTORY OF PRESENT ILLNESS:  The patient is a very pleasant 60 year old  white female, patient of Dr. Jodelle Green, who has a known history of  dysautonomia with Ehlers-Danlos and with previous sick sinus syndrome  status post pacemaker implantation and atrial tachycardia on chronic  Coumadin therapy with previous left carotid endarterectomy.  The patient  presents today after awaking this morning about 4 a.m. when she noticed  a large pool of blood in her mouth and blood on her pillow and sheets.  The patient got up to go to the bathroom and felt something thick in her  mouth and found that she had large amount of mixed saliva and blood in  her mouth.  Her sheets and pillow slip had much blood as well.  The  patient subsequently was seen at the Coumadin clinic today and was  therapeutic with an INR of 3, to note her INR goal is 2.5 to 3.5.  The  patient did not notice any injury to her tongue, gums after brushing her  teeth and rinsing.  No recent injury, chest pain, shortness of breath,  visual changes, headache.  She has had some nasal congestion, allergy-  like symptoms over the last several days with the high pollen count.  The patient denies any epistaxis, bloody stools, hematuria.  The patient  did have some mild epigastric discomfort one week ago but has now  resolved.  She denies any abdominal pain today, vomiting, nausea.   PAST MEDICAL HISTORY:  Reviewed.   CURRENT MEDICATIONS:  Reviewed.   PHYSICAL EXAMINATION:  The patient is a very pleasant tall female in no  acute distress.  She is afebrile with stable vital signs.  Blood pressure is 106/72, O2  saturation is 100% on room air.  HEENT:  Nasal mucosa is slightly pale with some mild  turbinate edema.  Nontender sinuses.  Conjunctivae noninjected.  TMs and EACs are clear.  Posterior pharynx is clear.  Tongue is midline without any abnormal  lesions.  Gums are pink and intact with no excoriations noted.  NECK:  Supple without cervical adenopathy.  A previous well-healed scar  along the left neck from previous carotid endarterectomy.  LUNGS:  Sounds are clear to auscultation bilaterally.  CARDIAC:  Regular rate and rhythm.  Grade 1/6 systolic murmur.  ABDOMEN:  Soft and nontender.  No palpable hepatosplenomegaly.  Bowel  sounds are positive throughout all 4 quadrants.  No guarding or rebound  noted.  No appreciable hepatosplenomegaly.  Negative CVA tenderness.  EXTREMITIES:  Warm without any calf tenderness, clubbing, cyanosis or  edema.  SKIN:  Warm without any rash or petechiae.  The patient does have a  bruise along her right medial thigh that she obtained one week ago after  she hit it on the side of a table.   DATA:  INR today 3, hemoglobin  14, hematocrit 40.5, platelet count 173.   IMPRESSION/PLAN:  Abnormal bleeding from the oral mucosa, questionable  etiology.  This could have possibly have been secondary to bloody  postnasal drainage.  Otherwise, it is unclear exactly where the blood  come from.  The patient does not have a cough.  No significant abdominal  symptoms and has had no recent injury.  The patient is advised to report  if she has any further bleeding.  She will continue to follow at the  Coumadin Clinic as recommended.  The patient will follow back up with  Dr. Kriste Basque as scheduled or sooner if needed.      Rubye Oaks, NP  Electronically Signed      Lonzo Cloud. Kriste Basque, MD  Electronically Signed   TP/MedQ  DD: 05/10/2006  DT: 05/11/2006  Job #: [id number]

## 2010-06-10 NOTE — Discharge Summary (Signed)
[place]  Patient:    Amy Lane, Amy Lane Visit Number: [id number] MRN: [id number]          Service Type: MED Location: [phone] Attending Physician:  Colon Branch Dictated by:   Abelino Derrick, P.A.C. Admit Date:  10/10/2000 Discharge Date: 10/12/2000   CC:         Nathen May, M.D., [facility]   Discharge Summary  DISCHARGE DIAGNOSES: 1. Paroxysmal atrial fibrillation, converted spontaneously to sinus rhythm    this admission. 2. Ehlers-Danlos syndrome and ______. 3. History of complex mitral valve repair at the [facility]. 4. Long QT syndrome. 5. Supraventricular tachycardia. 6. History of transient ischemic attack. 7. Treated hypothyroidism.  HISTORY OF PRESENT ILLNESS:  The patient is a 60 year old female followed by Nathen May, M.D., F.A.C.C.  She has a complex cardiac history as noted above.  She presented to the office on October 05, 2000, with episodic tachycardia.  She has had this before, but not that has lasted this long.  In the office, she was in atrial flutter with 2:1 conduction.  After discussion with Dr. Graciela Husbands, we started her on Coumadin and low-dose Cardizem to help with rate control, although she does have problems with hypotension.  She was to be sent up for a TEE cardioversion as an outpatient.  HOSPITAL COURSE:  She was admitted for her TEE cardioversion October 10, 2000, by [name]. [name], M.D., and set up for a TEE, which revealed normal mitral valve repair with no MR, normal LV function, and no left apical thrombus.  She was scheduled to have a DC cardioversion on October 11, 2000, but converted spontaneously prior to anesthesia for her cardioversion.  She was transferred to the floor and ambulated.  Throughout her hospitalization, she was kept on heparin.  It was decided that she could be discharged on Lovenox and Coumadin and have a pro time checked on Monday.  Nathen May, M.D., F.A.C.C., notes that her pacemaker was left in DDI mode.  DISCHARGE MEDICATIONS: 1. Synthroid 0.1 mg a day. 2. Aspirin 81 mg a day. 3. Coumadin 5 mg a day. 4. Cartia XL 120 mg a day. 5. Ativan 1 mg at h.s. 6. Sectral 400 mg in the morning and 200 mg at night. 7. ProAmatine two tablets three times a day. 8. Lovenox 60 mg subcu twice a day for three days.  LABORATORY DATA:  The EKG showed atrial flutter with ventricular pacing.  White count 4.9, hemoglobin 11.7, hematocrit 33.9, platelets 164.  INR 2.1 on October 10, 2000.  Sodium 139, potassium 4.2, BUN 13, creatinine 0.8.  DISPOSITION:  The patient was discharged in stable condition on Lovenox and Coumadin and will follow up with Nathen May, M.D., F.A.C.C., on October 30, 2000, at 4 p.m. Dictated by:   Abelino Derrick, P.A.C. Attending Physician:  Colon Branch DD:  10/29/00 TD:  10/29/00 Job: [id number]

## 2010-06-10 NOTE — Assessment & Plan Note (Signed)
[facility]                         ELECTROPHYSIOLOGY OFFICE NOTE   Amy, Lane                      MRN:          [id number]  DATE:03/09/2006                            DOB:          1950/08/07    Amy Lane is seen.  She has been having some more palpitations.  She  has been not tolerant of exercise because of tachypalpitations.   Her medication list is unusual in that she is taking Sectral once a day.  The Sectral has been on-board for a long, long time and I need to go  back through the old chart and review why it was that it was chosen.  I  think it probably antedates her pacemaker, and an alternative beta  blocker may be more beneficial at this point.   She also asked about her Coumadin and it was reiterated that the doctors  at the [facility] felt that it was important, given her prior TIA.  She has had these palpitations now for the last week in the context of  coming down with a viral illness.   On examination today, her blood pressure is 134/77, with a pulse of 89.  Her lungs were clear, heart sounds were regular.  Interrogation of her  [employer] pacemaker demonstrates that she has about 3-and-a-half years  left.  Her atrial high rate episodes are all associated with intrinsic  conduction, so reprogramming it to a DDI mode would not be of benefit.  There is also clustering in the last week, particularly in the last 72  hours.   Otherwise, her P wave was 2.0 with impedance of 456, a threshold of 0.5  at 0.4, with an R wave of 11.2, impedance of 663, and a threshold of 1  volt at 0.4.   IMPRESSION:  1. Sinus node dysfunction.  2. Intermittent heart block.  3. Atrial high rate episodes consistent with atrial tachycardia      clustered in the last week particularly.  4. Sectral therapy, question indication, question alternative.  5. Dysautonomia.  6. Status post mitral valve repair.  7. Carotid endarterectomy on chronic  Coumadin.   Amy Lane is stable (sort of).  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  She asked about Coumadin as noted above.  I will review the Sectral as  noted previously and we will follow her remotely via [facility] and will  look at her data monthly for the next couple of months and then spread  it out as things get better.     Duke Salvia, MD, [facility]  Electronically Signed   SCK/MedQ  DD: 03/09/2006  DT: 03/09/2006  Job #: [id number]

## 2010-06-10 NOTE — Discharge Summary (Signed)
   NAME:  Amy Lane, Amy Lane                       ACCOUNT NO.:  [id number]   MEDICAL RECORD NO.:  [id number]                   PATIENT TYPE:  OIB   LOCATION:  2002                                 FACILITY:  [facility]   PHYSICIAN:  Duke Salvia, M.D.               DATE OF BIRTH:  Jan 17, 1951   DATE OF ADMISSION:  07/25/2002  DATE OF DISCHARGE:  07/25/2002                                 DISCHARGE SUMMARY   PRIMARY DIAGNOSIS:  Atrial flutter.   HPI:  This is a 60 year old female with past medical history dysnomia in the  context of Ehlers-Danlos syndrome, status post atrial flutter ablation with  some recurrence, status post pacemaker for chronotropic incompetence.  The  patient was admitted and underwent a successful radiofrequency ablation of  her atrial flutter substrate, with bidirectional cavotricuspid isthmus  block, more __________ left atrial tach.  The patient had an uncomplicated  postprocedural course and was discharged to home later that evening as she  remained in stable condition.   DISCHARGE MEDICATIONS:  She was discharged on her previous medications:  1. Synthroid 0.1 mg daily.  2. Sectral 200 daily.  3. Lorazepam 1 nightly.  4. Coumadin as before.  5. Wellbutrin 150 daily.  6. ProAmatine 15 mg, 2 tablets daily.  7. She is to take Tylenol 1-2 tablets every four to six hours as needed.   ACTIVITY:  No heavy lifting or strenuous activity for four days.  No driving  for two days.   DIET:  Low-fat, low-cholesterol diet.   WOUND CARE:  She was to call if she developed any lump or drainage in her  groins.   FOLLOW-UP:  A followup appointment was scheduled with Dr. Sherryl Manges on  September 22, 2002, at 2:15 p.m.     Chinita Pester, C.R.N.P. [facility]                 Duke Salvia, M.D.    DS/MEDQ  D:  07/25/2002  T:  07/27/2002  Job:  [id number]

## 2010-06-10 NOTE — Op Note (Signed)
   NAME:  Amy, Lane                       ACCOUNT NO.:  [id number]   MEDICAL RECORD NO.:  [id number]                   PATIENT TYPE:  INP   LOCATION:  3734                                 FACILITY:  [facility]   PHYSICIAN:  [name], M.D. [facility]              DATE OF BIRTH:  27-Sep-1950   DATE OF PROCEDURE:  12/16/2001  DATE OF DISCHARGE:                                 OPERATIVE REPORT   PROCEDURE:  TEE.   INDICATIONS FOR PROCEDURE:  Status post atrial lead revision at the  [facility] with mitral valve repair, atrial flutter. The patient has a  [employer] Kappa pacemaker in prior to the procedure. She was on therapeutic  heparin with a PTT greater than 115.  Her INR was only 1.3.  The ventricular  and atrial outputs were increased for the cardioversion aspect of the  procedure.   DESCRIPTION OF PROCEDURE:  The patient was sedated with 10 mg of Versed,  12.5 Demerol, 100 mcg Fentanyl and 4 mg Zofran; used digital technique and  on plain film, exiting the distal esophagus without incident.   1. Transgastric imaging revealed normal LV function with an EF of 60%.  2. The mitral valve had been repaired.  3. The posterior leaflet was plicated.  It was basically a monocusp valve.     There was trivial MR, there was no MS.  4. The aortic valve was normal.  5. Right-sided cardiac chambers were normal.  6. Pacemaker wires were seen with no thrombus adherent to them.  7. Left atrial appendage was mildly enlarged. There was no thrombus.  8. By Doppler, the patient was clearly in flutter.   DESCRIPTION OF PROCEDURE:  There were no contraindications to the DC  cardioversion. The patient was then anesthetized with 100 mg sodium  Pentothal.  A single 200 joule synchronized shock was delivered. The patient  converted to sinus rhythm.  Native rate was 52 beats per minute with PR  interval of 273 msec.  Anti-atrial electrogram showed the patient had  converted from atrial flutter to normal  sinus rhythm. To help overdrive pace  the patient and keep her from going back into flutter, her lower rate limit  was set to 70 beats per minute with AV pacing and AV delay of 175 msec.   Post cardioversion, the ventricular and atrial lead thresholds were normal.  The patient tolerated the procedure well.  She will be discharged to home  after her INR is above 2.5 for 24 hour overlap.                                                [name], M.D. [facility]    PN/MEDQ  D:  12/16/2001  T:  12/16/2001  Job:  [id number]

## 2010-06-10 NOTE — Discharge Summary (Signed)
Amy Lane, Amy Lane             ACCOUNT NO.:  [id number]   MEDICAL RECORD NO.:  [id number]          PATIENT TYPE:  INP   LOCATION:  2108                         FACILITY:  [facility]   PHYSICIAN:  Balinda Quails, M.D.    DATE OF BIRTH:  12-Oct-1950   DATE OF ADMISSION:  11/06/2003  DATE OF DISCHARGE:  11/08/2003                                 DISCHARGE SUMMARY   HISTORY OF PRESENT ILLNESS:  [name] is a 60 year old female with a  history of Ehlers-Danlos syndrome who was evaluated by Dr. Madilyn Fireman for left  carotid disease that was symptomatic.  Recently, she has had several  episodes of right facial numbness.  The most recent occurred approximately  10 days prior to evaluation on November 02, 2003.  She underwent a carotid  Doppler examination at the [facility] which revealed  increase in plaque compared to previous study done on Jun 01, 2003.  There  was also some mobility noted in the plaque on real time imaging.  The  patient was noted to have no significant plaque on the right side.  A  previous CT angiogram has revealed normal intracranial vessels.  It was Dr.  Madilyn Fireman' opinion that the patient should undergo a carotid endarterectomy on  the left to reduce stroke risk.  She was admitted this hospitalization for  the procedure.   PAST MEDICAL HISTORY:  1.  Ehlers-Danlos syndrome.  2.  Prolonged QT syndrome.  3.  Dysautonomia with hypotension.  4.  Atrial arrhythmia status post ablation.  5.  Mitral valve disease status post repair.  6.  Permanent pacemaker.   MEDICATIONS ON ADMISSION:  1.  Synthroid 1 mg daily.  2.  ProAmatine 5 mg b.i.d.  3.  Acebutolol 200 mg daily.  4.  Wellbutrin XL 150 mg daily.  5.  Coumadin 5 mg daily with 7.5 mg on Tuesday and Thursday.  6.  Aspirin 81 mg daily.  7.  Lorazepam 1 mg q.h.s. p.r.n.   ALLERGIES:  No known drug allergies but she avoids medications that are  known to prolong QT segment.   FAMILY HISTORY:  Please see the  history and physical done at the time of  admission.   SOCIAL HISTORY:  Please see the history and physical done at the time of  admission.   REVIEW OF SYSTEMS:  Please see the history and physical done at the time of  admission.   PHYSICAL EXAMINATION:  Please see the history and physical done at the time  of admission.   HOSPITAL COURSE:  Patient was admitted on November 06, 2003, taken to the  operating room where she underwent the following procedure:  Left carotid  endarterectomy with Dacron patch angioplasty.  Procedure was performed by  Dr. Denman George.  Patient tolerated well.  She was taken to the ICU in  stable condition.   POSTOPERATIVE HOSPITAL COURSE:  Patient has done well.  She did initially  require dopamine for some mild hypotension.  This improved and the dopamine  was able to be weaned off.  She was neurologically intact with no focal  findings postoperatively.  She did have an infiltration of the dopamine in  the left arm and this was treated with Regitine with no complications.  Incision was healing well without evidence of infection or hematoma.  She  tolerated a gradual and routine advancement in activities commensurate for  level of postoperative convalescence.  She was felt to be quite stable for  discharge on November 08, 2003.   DISCHARGE MEDICATIONS:  1.  As preoperatively.  2.  Additionally, she is to resume her normal Coumadin dosing.  She was to      obtain a PT/INR through normal means Wednesday or Thursday the following      week.  3.  For pain, Tylox one to two q.4h. as needed.   INSTRUCTIONS:  The patient will receive written instructions in regard to  medications, activity, diet, wound care, and follow-up.  Follow-up will  include Dr. Madilyn Fireman on Monday, November 23, 2003.   FINAL DIAGNOSES:  Severe symptomatic extracranial cerebrovascular occlusive  disease now status post left carotid endarterectomy this hospitalization.  Other diagnoses  as previously dictated per history.   CONDITION ON DISCHARGE:  Stable, improved.       WEG/MEDQ  D:  12/16/2003  T:  12/16/2003  Job:  [id number]   cc:   Quita Skye. Hart Rochester, M.D.  [address]  [place]  [place] [address]   Lonzo Cloud. Kriste Basque, M.D. [facility]   [name], M.D.   Marlan Palau, M.D.  [address]  [address]  [place]  [place] [address]  Fax: [phone]

## 2010-06-10 NOTE — Cardiovascular Report (Signed)
   NAME:  Amy Lane, Amy Lane NO.:  [id number]   MEDICAL RECORD NO.:  [id number]                   PATIENT TYPE:  EMS   LOCATION:  MAJO                                 FACILITY:  [facility]   PHYSICIAN:  [name], M.D.                  DATE OF BIRTH:  1950/08/19   DATE OF PROCEDURE:  DATE OF DISCHARGE:                              CARDIAC CATHETERIZATION   HISTORY:  The patient is a 60 year old patient of Dr. Graciela Husbands and myself.  She  has dysautonomia with mitral valve prolapse status post atrial fibrillation  ablation with pacemaker and multiple lead revisions.  She presented to  Duke Salvia, M.D. in the office today with rapid atrial fibrillation,  symptomatic, with some lightheadedness.  She has been on chronic Coumadin.  Her INR today is 2.8.  Her sent her to the emergency department for a  cardioversion.   Anesthesiology was present and anesthetized her with Diprivan.  A single 100  joule biphasic shock was delivered.  The patient converted from atrial  flutter, had a rate of 130 to sinus rhythm with AV pacing.  Representatives  from [employer] were present and confirmed the maintenance of sinus rhythm by  intra-atrial electrogram.   The patient tolerated the procedure well.  She will be discharged from the  ER.  She will have her pro time followed up in our Coumadin Clinic again on  Monday.  She will then see Duke Salvia, M.D. or myself back in the  office.                                               [name], M.D.    PN/MEDQ  D:  07/02/2002  T:  07/02/2002  Job:  [id number]

## 2010-06-10 NOTE — Procedures (Signed)
[place]  Patient:    Amy Lane, Amy Lane Visit Number: [id number] MRN: [id number]          Service Type: MED Location: [phone] Attending Physician:  Colon Branch Dictated by:   [name] [name], M.D. [facility] Proc. Date: 10/10/00 Admit Date:  10/10/2000   CC:         Nathen May, M.D. [facility]   Procedure Report  PROCEDURES: Transesophageal echocardiography.  INDICATIONS: Precardioversion atrial flutter, history of mitral valve repair.  SEDATION: The patient was sedated with 10 mg intravenous Versed.  DESCRIPTION OF PROCEDURE: Using digital technique an Omniplane probe was advanced into the distal esophagus without incident.  Left ventricle is mildly dilated but had no LV function with an EF of 55%. Mitral valve repair was intact.  There was plication of both the anterior and posterior leaflets. There was no significant MR and no significant mitral stenosis.  The mean gradient was less than 5 mm.  The left atrial appendage is well-visualized.  There was minimal spontaneous contrast and no thrombus. Atrial septum was intact.  Right-sided cardiac chambers were normal.  The aortic valve was trileaflet and minimally sclerotic.  There was no aortic debris.  The patient also was noticed to have ventricular and atrial pacing wires seen in the right-sided cavity.  IMPRESSION: No source of embolus.  PLAN: Since the patient was not on heparin or Lovenox and her INR was only 1.5, she will be admitted for heparin therapy and attempted cardioversion tomorrow. Dictated by:   [name] [name], M.D. [facility] Attending Physician:  Colon Branch DD:  10/11/00 TD:  10/11/00 Job: [id number]

## 2010-06-10 NOTE — Op Note (Signed)
NAMELACHRISTA, HESLIN NO.:  [id number]   MEDICAL RECORD NO.:  [id number]          PATIENT TYPE:  OUT   LOCATION:  CATS                         FACILITY:  [facility]   PHYSICIAN:  Balinda Quails, M.D.    DATE OF BIRTH:  March 08, 1950   DATE OF PROCEDURE:  12/07/2003  DATE OF DISCHARGE:                                 OPERATIVE REPORT   PREOPERATIVE DIAGNOSIS:  Symptomatic moderate ulcerated left internal  carotid artery stenosis.   POSTOPERATIVE DIAGNOSIS:  Symptomatic moderate ulcerated left internal  carotid artery stenosis.   OPERATION PERFORMED:  Left carotid endarterectomy, Dacron patch angioplasty.   SURGEON:  Balinda Quails, M.D.   ASSISTANT:  Jerold Coombe, P.A.   ANESTHESIA:  General endotracheal.   ANESTHESIOLOGIST:  Dr. Jean Rosenthal.   INDICATIONS FOR PROCEDURE:  The patient is a 60 year old female with  multiple medical problems referred for evaluation of potential symptomatic  left carotid disease.  She has suffered several episodes of right facial  numbness along with periods of dizziness.  A carotid Doppler evaluation  revealed left carotid plaque with a moderate stenosis and noted ulceration.  The patient was seen in consultation in the office.  Risks and benefits of  the operative procedure were explained to the patient in detail.  Indications, left carotid endarterectomy for reduction of stroke risk and  relief of recurrent symptoms.  Risks of the operative procedure are 1 to 2%  associated with this operation including but not limited to MI, CVA and  cranial nerve injury and death.  The patient consented for surgery.   DESCRIPTION OF PROCEDURE:  The patient was brought to the operating room in  stable hemodynamic condition.  Placed under endotracheal anesthesia.  Foley  catheter and arterial line in place.  Left neck prepped and draped in the  sterile fashion.  A curvilinear skin incision was made along the anterior  border of the left  sternocleidomastoid muscle.  Dissection carried down  through the subcutaneous tissue with electrocautery.  Platysma incised.  Deep dissection carried down to the carotid sheath.  The fascial vein  ligated with 2-0 silk and divided.  The common carotid artery was mobilized  down to the base of the neck.  The bifurcation was noted to be relatively  low in the neck.  The common carotid artery was mobilized proximally and  encircled with a vessel loop.  Distal dissection then carried up to the  origin of the superior thyroid and external carotid artery which were  encircled with vessel loops.  The internal carotid artery then mobilized  distally up to the posterior belly of the digastric muscle and encircled  with a vessel loop.  Evaluation of the carotid bifurcation did reveal plaque  in the bulb and extending into the internal carotid artery.  The patient  administered 7000 units of heparin intravenously.  The left carotid vessels  were controlled with clamps.  Longitudinal arteriotomy made in the distal  left common carotid artery.  The arteriotomy extended across the carotid  bulb and up into the internal carotid artery.  There was a moderate stenosis  present in the left internal carotid artery with ulceration.  A shunt was  inserted.   An endarterectomy elevator used to remove the plaque.  The endarterectomy  carried down into the common carotid artery here the plaque was divided  transversely with Potts scissors.  Plaque then raised up into the bulb where  the superior thyroid and external carotid artery were endarterectomized  using an eversion technique.  The remainder of the plaque then removed from  the internal carotid artery where it feathered out well distally.  Fragments  of plaque removed with plaque forceps.  Site irrigated with heparin saline  solution.   A patch angioplasty endarterectomy site was carried out with running 6-0  Prolene suture using a Dacron patch.  Shunt  then removed.  All vessels well  flushed.  Clamps were removed directing initial antegrade flow up the  external carotid artery.  Following this, the internal carotid was released.  There was an excellent pulse and Doppler signal in the distal internal  carotid artery.  Adequate hemostasis was obtained.  Sponge and instrument  counts were correct.  The sternomastoid fascia was then closed with running  2-0 Vicryl suture.  The platysma closed with running 3-0 Vicryl suture.  Skin closed with 4-0 Monocryl.  Steri-Strips applied.  There were no  apparent complications during the operative procedure.  The patient was then  transferred to the recovery room in stable condition hemodynamically intact.       PGH/MEDQ  D:  12/10/2003  T:  12/11/2003  Job:  [id number]

## 2010-06-20 ENCOUNTER — Telehealth: Payer: Self-pay | Admitting: Nurse Practitioner

## 2010-06-20 NOTE — Telephone Encounter (Signed)
pts husband called stating that his wife is currently in [place] and had an inr checked today: 4.7.  i rec. That she hold coumadin today and decrease her daily dose to 7.5mg  daily with f/u inr later this week.  He is going to email his wife to let her know of the instructions.

## 2010-06-27 ENCOUNTER — Ambulatory Visit (INDEPENDENT_AMBULATORY_CARE_PROVIDER_SITE_OTHER): Payer: No Typology Code available for payment source | Admitting: *Deleted

## 2010-06-27 DIAGNOSIS — I4891 Unspecified atrial fibrillation: Secondary | ICD-10-CM

## 2010-06-27 DIAGNOSIS — G459 Transient cerebral ischemic attack, unspecified: Secondary | ICD-10-CM

## 2010-06-28 ENCOUNTER — Encounter: Payer: Self-pay | Admitting: Cardiovascular Disease

## 2010-06-30 ENCOUNTER — Ambulatory Visit (INDEPENDENT_AMBULATORY_CARE_PROVIDER_SITE_OTHER): Payer: No Typology Code available for payment source | Admitting: *Deleted

## 2010-06-30 ENCOUNTER — Other Ambulatory Visit: Payer: Self-pay

## 2010-06-30 DIAGNOSIS — I471 Supraventricular tachycardia: Secondary | ICD-10-CM

## 2010-06-30 DIAGNOSIS — I4891 Unspecified atrial fibrillation: Secondary | ICD-10-CM

## 2010-07-01 ENCOUNTER — Encounter: Payer: No Typology Code available for payment source | Admitting: *Deleted

## 2010-07-05 ENCOUNTER — Ambulatory Visit (INDEPENDENT_AMBULATORY_CARE_PROVIDER_SITE_OTHER): Payer: No Typology Code available for payment source | Admitting: Cardiovascular Disease

## 2010-07-05 ENCOUNTER — Encounter: Payer: Self-pay | Admitting: Cardiovascular Disease

## 2010-07-05 ENCOUNTER — Ambulatory Visit (INDEPENDENT_AMBULATORY_CARE_PROVIDER_SITE_OTHER): Payer: No Typology Code available for payment source | Admitting: *Deleted

## 2010-07-05 DIAGNOSIS — Q078 Other specified congenital malformations of nervous system: Secondary | ICD-10-CM

## 2010-07-05 DIAGNOSIS — I059 Rheumatic mitral valve disease, unspecified: Secondary | ICD-10-CM

## 2010-07-05 DIAGNOSIS — G459 Transient cerebral ischemic attack, unspecified: Secondary | ICD-10-CM

## 2010-07-05 DIAGNOSIS — I4891 Unspecified atrial fibrillation: Secondary | ICD-10-CM

## 2010-07-05 DIAGNOSIS — O2230 Deep phlebothrombosis in pregnancy, unspecified trimester: Secondary | ICD-10-CM

## 2010-07-05 DIAGNOSIS — I679 Cerebrovascular disease, unspecified: Secondary | ICD-10-CM

## 2010-07-05 DIAGNOSIS — M79609 Pain in unspecified limb: Secondary | ICD-10-CM

## 2010-07-05 DIAGNOSIS — M79669 Pain in unspecified lower leg: Secondary | ICD-10-CM

## 2010-07-05 DIAGNOSIS — E7800 Pure hypercholesterolemia, unspecified: Secondary | ICD-10-CM

## 2010-07-05 DIAGNOSIS — I495 Sick sinus syndrome: Secondary | ICD-10-CM

## 2010-07-05 DIAGNOSIS — I6529 Occlusion and stenosis of unspecified carotid artery: Secondary | ICD-10-CM

## 2010-07-05 LAB — POCT INR: INR: 2.3

## 2010-07-05 NOTE — Assessment & Plan Note (Signed)
DVT vs gastrocnemius strain with bleed from suprRx INR  F/U LLE duplex.  INR Rx now

## 2010-07-05 NOTE — Assessment & Plan Note (Signed)
S/P LCEA ? Intimal disection.  F/U duplex in November

## 2010-07-05 NOTE — Assessment & Plan Note (Signed)
Stable with no syncope or presyncope.  Has pacer for backup

## 2010-07-05 NOTE — Assessment & Plan Note (Signed)
S/P repair with no residual murmur 

## 2010-07-05 NOTE — Patient Instructions (Signed)
Your physician recommends that you schedule a follow-up appointment in: November 2012 with Dr. [name]  Your physician has requested that you have a carotid duplex. This test is an ultrasound of the carotid arteries in your neck. It looks at blood flow through these arteries that supply the brain with blood. Allow one hour for this exam. There are no restrictions or special instructions.  Your physician has requested that you have a lower  extremity venous duplex. This test is an ultrasound of the veins in the legs or arms. It looks at venous blood flow that carries blood from the heart to the legs or arms. Allow one hour for a Lower Venous exam. Allow thirty minutes for an Upper Venous exam. There are no restrictions or special instructions.

## 2010-07-05 NOTE — Assessment & Plan Note (Signed)
Cholesterol is at goal.  Continue current dose of statin and diet Rx.  No myalgias or side effects.  F/U  LFT's in 6 months. Lab Results  Component Value Date   LDLCALC 60 10/13/2009

## 2010-07-05 NOTE — Progress Notes (Signed)
Amy Lane is seen in followup for sinus bradycardia status post pacemaker, status post generator replacement and lead repair March 2011.  She also has a history of Ehlers-Danlos syndrome in the context of a marfanoid habitus, mitral valve prolapse status post mitral valve repair at the [facility] clinic with prior TIA on chronic Coumadin as well as syncope due to diagnosis of long QT syndrome. SHe has had significant dysautonomic symptoms consistent with POTS S/P left CEA with normal duplex 11/11  Discussed low carb diet and exercise program for weight gain   Went to [place] about 3 weeks ago and got ? Superficial phlebitis with elevated INR and bleeding with RLE swelling.  No DVT or compartment syndrome.  Has F/U in coumadin clinic twice since then  6/4 INR 1.8 6/12 INR 2.3  Reviewed numerous notes from [place] which were hard to decipher.  INR was elevated at 4.3.  I suspect she had a muscle strain from all the walking and bled into it.   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  All other systems reviewed and negative  General: Affect appropriate Healthy:  appears stated age HEENT: normal Neck supple with no adenopathy JVP normal no bruits no thyromegaly Lungs clear with no wheezing and good diaphragmatic motion Heart:  S1/S2 no murmur,rub, gallop or click PMI normal Abdomen: benighn, BS positve, no tenderness, no AAA no bruit.  No HSM or HJR Distal pulses intact with no bruits No edema Neuro non-focal Skin warm and dry No muscular weakness   Current Outpatient Prescriptions  Medication Sig Dispense Refill  . acebutolol (SECTRAL) 200 MG capsule Take 1 capsule (200 mg total) by mouth daily. Take one tablet daily   90 capsule  3  . aspirin 81 MG tablet Take 81 mg by mouth daily.        Marland Kitchen levothyroxine (SYNTHROID, LEVOTHROID) 75 MCG tablet Take 1 tablet (75 mcg total) by mouth daily.  90 tablet  3  . LORazepam (ATIVAN) 1 MG tablet Take 1 mg by mouth at bedtime as needed.        . warfarin (COUMADIN) 5 MG tablet Take as directed by the coumadin clinic.  120 tablet  0  . DISCONTD: LORazepam (ATIVAN) 1 MG tablet Take 1 tablet (1 mg total) by mouth every 8 (eight) hours.  90 tablet  5    Allergies  Review of patient's allergies indicates not on file.  Electrocardiogram:  Assessment and Plan

## 2010-07-05 NOTE — Assessment & Plan Note (Signed)
Reviewed phone transmission from 6/7  Normal pacer function

## 2010-07-05 NOTE — Assessment & Plan Note (Signed)
Maint NSR Chronic anticoagulation.  S/P ablation of atrial arrhythmias.  F/U Dr Graciela Husbands

## 2010-07-07 NOTE — Progress Notes (Signed)
Pacer remote check  

## 2010-07-08 ENCOUNTER — Encounter: Payer: Self-pay | Admitting: *Deleted

## 2010-07-11 ENCOUNTER — Encounter: Payer: Self-pay | Admitting: Cardiovascular Disease

## 2010-07-11 ENCOUNTER — Encounter (INDEPENDENT_AMBULATORY_CARE_PROVIDER_SITE_OTHER): Payer: No Typology Code available for payment source | Admitting: *Deleted

## 2010-07-11 DIAGNOSIS — M79669 Pain in unspecified lower leg: Secondary | ICD-10-CM

## 2010-07-11 DIAGNOSIS — M7989 Other specified soft tissue disorders: Secondary | ICD-10-CM

## 2010-07-11 DIAGNOSIS — M79609 Pain in unspecified limb: Secondary | ICD-10-CM

## 2010-07-19 ENCOUNTER — Ambulatory Visit (INDEPENDENT_AMBULATORY_CARE_PROVIDER_SITE_OTHER): Payer: No Typology Code available for payment source | Admitting: *Deleted

## 2010-07-19 DIAGNOSIS — I4891 Unspecified atrial fibrillation: Secondary | ICD-10-CM

## 2010-07-19 DIAGNOSIS — G459 Transient cerebral ischemic attack, unspecified: Secondary | ICD-10-CM

## 2010-08-02 ENCOUNTER — Other Ambulatory Visit: Payer: Self-pay | Admitting: Gynecology

## 2010-08-02 ENCOUNTER — Ambulatory Visit (INDEPENDENT_AMBULATORY_CARE_PROVIDER_SITE_OTHER): Payer: No Typology Code available for payment source | Admitting: *Deleted

## 2010-08-02 DIAGNOSIS — I4891 Unspecified atrial fibrillation: Secondary | ICD-10-CM

## 2010-08-02 DIAGNOSIS — G459 Transient cerebral ischemic attack, unspecified: Secondary | ICD-10-CM

## 2010-08-05 ENCOUNTER — Ambulatory Visit (INDEPENDENT_AMBULATORY_CARE_PROVIDER_SITE_OTHER): Payer: No Typology Code available for payment source | Admitting: Internal Medicine

## 2010-08-05 ENCOUNTER — Encounter: Payer: Self-pay | Admitting: Internal Medicine

## 2010-08-05 VITALS — BP 112/70 | HR 65 | Temp 98.0°F | Ht 70.5 in | Wt 134.0 lb

## 2010-08-05 DIAGNOSIS — J309 Allergic rhinitis, unspecified: Secondary | ICD-10-CM

## 2010-08-05 MED ORDER — PREDNISONE (PAK) 10 MG PO TABS: ORAL_TABLET | ORAL | Status: AC

## 2010-08-05 NOTE — Progress Notes (Deleted)
Subjective:     Patient ID: Amy Lane, female   DOB: 1950-04-19, 60 y.o.   MRN: [id number]  HPI  ccc Review of Systems     Objective:   Physical Exam     Assessment:     ***    Plan:     ***

## 2010-08-05 NOTE — Patient Instructions (Signed)
Eustachian tube dysfunction causes middle ear not to get enough air and is due to inflammation (no obvious infection)   Best treated with decongestants you can't take, so best option is 6 days of prednisone and follow up with Dr Kriste Basque or ENT if not improving

## 2010-08-05 NOTE — Progress Notes (Signed)
Subjective:    Patient ID: Amy Lane, female    DOB: 1950-01-26, 60 y.o.   MRN: [id number]  HPI 60 y/o WF with  mult med problems as noted below... she is followed regularly by Drs Azucena Fallen for Cardiology, and Drs Purcell Nails for GI...  ~  April 16, 2009:  she states that she is feeling "great" & feels the Lexapro is really helping... had dual chamber pacemaker replaced 3/10 by DrKlein (for end-of-life), and she's gained 5# on Ensure supplements... she also saw DrNishan 12/10 for f/u of her Ehlers-Danlos, dysautonomia, SSS w/ pacer, carotid dis w/ tia, & MVP... CDopplers 2/11 stable... she is looking forward to an appt at [facility] in their [facility] for a second opinion regarding her Kraurosis Vulvae... OK TDAP today.  ~  October 13, 2009:  she was seen by Dr. Everlene Other at the [facility] at [facility] for her vulvar dermatitis & dryness & rec to use Crisco shortening Tid... she reports improved over the last 46mo- feeling better, in good spirits w/ the Lexapro helping, etc... she has f/u appts w/ DrKlein 6/11 & Walker Kehr 7/11> both felt she was doing well- noted normal atrial pacing w/ long latency on EKG & they are following for now... due for f/u fasting labs & Flu shot today... mild dermatitis on legs- LidexE written...  ~  May 23, 2010:  33mo ROV & she reports feeling "the best I have in yrs" attributed to stopping her Lipitor "too many neuro issues and not enough benefit" per DrWillis & [name];  She is looking forward to [place] vacation w/ family this summer; she was able to stop the Lexapro last January & doing fine without it now...  She denies CP, palpit, dizziness, syncope, edema, or cerebral ischemic symptoms... Due for f/u fasting labs> TChol 218, LDL 135, and she will continue to control this w/ low chol/ low fat diet...   08/05/2010 ov/Quanika Solem cc R ear fullness since got off airplane from [place] with slt muffled hearing, no flare of rhinitis or pain chewing.  Pt denies  any significant sore throat, dysphagia, itching, sneezing,  nasal congestion or excess/ purulent secretions,  fever, chills, sweats, unintended wt loss, pleuritic or exertional cp, hempoptysis, orthopnea pnd or leg swelling.    Also denies any obvious fluctuation of symptoms with weather or environmental changes or other aggravating or alleviating factors.           Problem List:  ALLERGIC RHINITIS (ICD-477.9) - we discussed Rx w/ Zyrtek, Astepro Prn, etc... ~  CXR 3/11 showed left pacer, sternal wires, NAD...  MITRAL VALVE PROLAPSE (ICD-424.0) - followed by Walker Kehr... ~  2DEcho 11/08 showed myxomatous MV, annuloplasty ring, decr post leaflet excursion, norm LVH & wall motion... ~  NuclearStressTest 11/08 showed mild apical thinning... prev study 12/04 w/o ischemia or infarct & EF=68%... ~  she sees [place] every 6 months- for f/u MVP (s/p MV repair), dysautonomia (s/p pacer), & Ehlers-Danlos Syndrome> notes reviewed...  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 hx AFib/Flutter w/ ablation & pacemaker placed ... she has Ehlers-Danlos synd w/ prolonged QT interval...   CARDIAC PACEMAKER IN SITU (ICD-V45.01) - f/u DrKlein w/ CTChest 4/09- neg. ~  dual chamber pacer changed 3/11 by DrKlein for end-of-life...  DYSAUTONOMIA (ICD-742.8) - prev on Proamatine 5mg  tabs per DrKlein, but this was stopped...  CEREBROVASCULAR DISEASE (ICD-437.9) - on ASA 81mg /d & COUMADIN (followed in the clinic)... ~  she had  a Left CAE 11/05 by [facility] for symptomatic left carotid stenosis w/ ulceration... ~  Carotid angiogram 5/08 by [facility] showed <30% right carotid stenosis ~  f/u CTA of Head & Neck 12/09 was neg= norm CTA head, & no signif carotid dis noted in neck. ~  CDoppler 2/11 showed stable mild carotid dis, left CAE w/ DPA is patent, 0-39% bilat ICA stenoses. ~  repear CDopplers 11/11 per Walker Kehr showed smooth plaque in right bulb & distal left CCA-  stable; 0-39% bilat ICA stenoses...  HYPERCHOLESTEROLEMIA (ICD-272.0) - on diet alone now, prev on Lip20- this was stopped 1/12 by Walker Kehr & she feels much better off this med! ~  FLP 05/13/07 on Lip20 showed TChol 119, TG 50, HDL 55, LDL 54 ~  FLP 5/10 on Lip20 showed TChol 120, TG 43, HDL 58, LDL 54 ~  FLP 9/11 on Lip20 showed TChol 130, TG 54, HDL 59, LDL 60 ~  FLP 5/12 on diet alone showed TChol 218, TG 91, HDL 57, LDL 135... Continue low chol, low fat diet.  HYPOTHYROIDISM (ICD-244.9) - currently on SYNTHROID 6mcg/d... ~  labs 4/09 on Levoth88 showed TSH = 0.10... rec to decr Synthroid to 67mcg/d... ~  labs 9/09 on Levoth75 showed TSH = 1.67 ~  labs 5/10 on Levoth75 showed TSH = 2.92 ~  labs 9/11 on Levoth75 showed TSH= 1.37 ~  Labs 5/12 on Levothy75 showed TSH= 4.33  HEMORRHOIDS (ICD-455.6) - last colonoscopy 2/09 by DrJacobs showed only sm hems... f/u 84yrs.  GYN - followed by DrMcPhail and Dx w/ kraurosis vulvae 7/10... Prev on Premarin VagCream. ~  11/10: she discussed the Dx w/ me & will inquire about poss hyst/ BSO... ~  4/11:  eval in the [facility] by Dr Everlene Other showed vulvar dermatitis & dryness w/ rec for "Crisco" Tid!  FIBROMYALGIA (ICD-729.1) - she has been doing Yoga classes and this has helped...  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  Hx of TRANSIENT ISCHEMIC ATTACK (ICD-435.9) - she has been followed by DrWillis... s/p left carotid endarterectomy 11/05 by [facility]. ~  1/12: f/u eval by DrWillis reviewed...  Hx of SYNCOPE (ICD-780.2) - felt to be neurally mediated, ? related to long QT, no recur since pacer placed...  DEPRESSION (ICD-311) - she takes LORAZEPAM 1mg Qhs for insomnia... she prev saw DrGraves for counselling in [place]... ~  11/10: poor appetite & some wt loss prob related to depression & we discussed trial LEXAPRO 10mg /d. ~  3/11: she is very pleased- feels better, gained 5# w/ Ensure, etc... ~  9/11:   "I feel great" & Lexapro continued... ~  She was able to stop the Lexapro 1/12 (also stopped the Lipitor) & has done well off it ever since...  CARCINOMA, BASAL CELL (ICD-173.9) - skin cancer removed from left leg by [facility] 2008. ~  12/11:  She reports bx of rash on legs= lichen planus per DrHall, treated w/ topical ointment...  Health Maintenance: ~  GI:  followed by DrJacobs w/ colonoscopy 2/09 showing only sm hems... ~  GYN:  followed by DrMcPhail, and Dr. Everlene Other at [facility] vulvar clinic... ~  Labs 9/11 showed Vit D level = 39 & rec to start 1000 u OTC supplement... ~  Immunizations:  she had TDAP 3/11... she gets yearly Flu vaccine in the Fall of the yr...    Past Surgical History  Procedure Date  . Complex mitral valve repair     at the [facility]  . Left  carotid endarterectomy 11/2003    by Dr. Amada Kingfisher  . Pacemaker insertion     medtronic kappa [phone]    Outpatient Encounter Prescriptions as of 05/23/2010  Medication Sig Dispense Refill  . acebutolol (SECTRAL) 200 MG capsule Take one tablet daily       . aspirin 81 MG chewable tablet Chew 81 mg by mouth daily.        Marland Kitchen levothyroxine (SYNTHROID, LEVOTHROID) 75 MCG tablet Take 75 mcg by mouth daily.        Marland Kitchen LORazepam (ATIVAN) 1 MG tablet Take 1 mg by mouth every 8 (eight) hours.        Marland Kitchen warfarin (COUMADIN) 5 MG tablet Take by mouth as directed.           Review of Systems    Objective:   Physical Exam     WD, Thin, 60 y/o WF in NAD... Wt  141   >  134 08/05/2010  Vital Signs:  Reviewed... GENERAL:  Alert & oriented; pleasant & cooperative... HEENT:  [place]/AT, EOM-wnl, PERRLA, EACs-clear, TMs-wnl, NOSE-clear, THROAT-clear & wnl. NECK:  Supple w/ fair ROM; no JVD; normal carotid impulses w/o bruits, left CAE scar; no thyromegaly or nodules palpated; no lymphadenopathy. CHEST:  Clear to P & A; without wheezes/ rales/ or rhonchi heard... HEART:  Regular Rhythm; pacer on left, without murmurs/ rubs/ or gallops  detected... ABDOMEN:  Soft & nontender; normal bowel sounds; no organomegaly or masses palpated... EXT: without deformities or arthritic changes; no varicose veins/ venous insuffic/ or edema. NEURO:  CN's intact;  no focal neuro deficits... DERM:  s/p skin cancer removed from left leg; min rash noted...   Assessment & Plan:

## 2010-08-05 NOTE — Assessment & Plan Note (Signed)
Classic eustachian tube dysfunction.  Can't take decongestants due to svt, no evidence infection  Discussed in detail all the  indications, usual  risks and alternatives  relative to the benefits with patient who agrees to proceed with short course of prednisone and f/u Dr Kriste Basque or ENT

## 2010-08-11 ENCOUNTER — Telehealth: Payer: Self-pay | Admitting: Pulmonary Disease

## 2010-08-11 DIAGNOSIS — H9209 Otalgia, unspecified ear: Secondary | ICD-10-CM

## 2010-08-11 NOTE — Telephone Encounter (Signed)
Spoke with pt. She was last seen on 08/05/10- was given pred taper. She states that she finished this today, ears are better, "but still not right"- things still sound muffled. She states that the pain has resolved though. Wants recs from SN. Please advise thanks!

## 2010-08-11 NOTE — Telephone Encounter (Signed)
Per SN----we will set up appt for pt to see ENT---she can use mucinex 600mg   2 po bid with plenty of fluids and saline nasal spray every 2 hours.  Pt is aware that we will call once the ent appt has been made.  Pt voiced her understanding

## 2010-08-16 ENCOUNTER — Ambulatory Visit (INDEPENDENT_AMBULATORY_CARE_PROVIDER_SITE_OTHER): Payer: No Typology Code available for payment source | Admitting: *Deleted

## 2010-08-16 DIAGNOSIS — I4891 Unspecified atrial fibrillation: Secondary | ICD-10-CM

## 2010-08-16 DIAGNOSIS — G459 Transient cerebral ischemic attack, unspecified: Secondary | ICD-10-CM

## 2010-09-06 ENCOUNTER — Ambulatory Visit (INDEPENDENT_AMBULATORY_CARE_PROVIDER_SITE_OTHER): Payer: No Typology Code available for payment source | Admitting: *Deleted

## 2010-09-06 DIAGNOSIS — I4891 Unspecified atrial fibrillation: Secondary | ICD-10-CM

## 2010-09-06 DIAGNOSIS — G459 Transient cerebral ischemic attack, unspecified: Secondary | ICD-10-CM

## 2010-09-06 LAB — POCT INR: INR: 3.4

## 2010-10-04 ENCOUNTER — Ambulatory Visit (INDEPENDENT_AMBULATORY_CARE_PROVIDER_SITE_OTHER): Payer: No Typology Code available for payment source | Admitting: *Deleted

## 2010-10-04 DIAGNOSIS — G459 Transient cerebral ischemic attack, unspecified: Secondary | ICD-10-CM

## 2010-10-04 DIAGNOSIS — I4891 Unspecified atrial fibrillation: Secondary | ICD-10-CM

## 2010-10-05 ENCOUNTER — Encounter: Payer: No Typology Code available for payment source | Admitting: *Deleted

## 2010-10-06 ENCOUNTER — Encounter: Payer: No Typology Code available for payment source | Admitting: *Deleted

## 2010-10-10 ENCOUNTER — Encounter: Payer: Self-pay | Admitting: *Deleted

## 2010-10-18 ENCOUNTER — Other Ambulatory Visit: Payer: Self-pay | Admitting: Internal Medicine

## 2010-10-18 ENCOUNTER — Encounter: Payer: Self-pay | Admitting: Internal Medicine

## 2010-10-18 ENCOUNTER — Ambulatory Visit (INDEPENDENT_AMBULATORY_CARE_PROVIDER_SITE_OTHER): Payer: No Typology Code available for payment source | Admitting: *Deleted

## 2010-10-18 DIAGNOSIS — I498 Other specified cardiac arrhythmias: Secondary | ICD-10-CM

## 2010-10-18 DIAGNOSIS — R001 Bradycardia, unspecified: Secondary | ICD-10-CM

## 2010-10-19 LAB — DIFFERENTIAL
Basophils Absolute: 0.1
Basophils Relative: 1
Eosinophils Absolute: 0.1
Eosinophils Relative: 2
Monocytes Absolute: 0.4
Neutro Abs: 2.8

## 2010-10-19 LAB — POCT I-STAT, CHEM 8
Calcium, Ion: 1.08 — ABNORMAL LOW
Creatinine, Ser: 1.1
Glucose, Bld: 94
Hemoglobin: 12.9
Sodium: 139
TCO2: 23

## 2010-10-19 LAB — CBC
Hemoglobin: 13.1
MCHC: 34.3
RDW: 13.2

## 2010-10-24 LAB — REMOTE PACEMAKER DEVICE
AL IMPEDENCE PM: 430 Ohm
ATRIAL PACING PM: 75
BATTERY VOLTAGE: 2.79 V
RV LEAD IMPEDENCE PM: 599 Ohm
VENTRICULAR PACING PM: 0

## 2010-10-26 ENCOUNTER — Ambulatory Visit (INDEPENDENT_AMBULATORY_CARE_PROVIDER_SITE_OTHER): Payer: No Typology Code available for payment source | Admitting: *Deleted

## 2010-10-26 DIAGNOSIS — G459 Transient cerebral ischemic attack, unspecified: Secondary | ICD-10-CM

## 2010-10-26 DIAGNOSIS — I4891 Unspecified atrial fibrillation: Secondary | ICD-10-CM

## 2010-10-26 LAB — POCT INR: INR: 4.3

## 2010-10-27 ENCOUNTER — Encounter: Payer: Self-pay | Admitting: *Deleted

## 2010-10-27 ENCOUNTER — Ambulatory Visit (INDEPENDENT_AMBULATORY_CARE_PROVIDER_SITE_OTHER): Payer: No Typology Code available for payment source | Admitting: Adult Health

## 2010-10-27 ENCOUNTER — Encounter: Payer: Self-pay | Admitting: Adult Health

## 2010-10-27 DIAGNOSIS — M2669 Other specified disorders of temporomandibular joint: Secondary | ICD-10-CM

## 2010-10-27 DIAGNOSIS — M26629 Arthralgia of temporomandibular joint, unspecified side: Secondary | ICD-10-CM | POA: Insufficient documentation

## 2010-10-27 DIAGNOSIS — J309 Allergic rhinitis, unspecified: Secondary | ICD-10-CM

## 2010-10-27 MED ORDER — MOMETASONE FUROATE 50 MCG/ACT NA SUSP: 2.0000 | Freq: Two times a day (BID) | NASAL | Status: DC

## 2010-10-27 NOTE — Assessment & Plan Note (Signed)
Flare   Plan  Nasonex 2 puffs Twice daily   Allegra 180mg  daily  Saline nasal rinses As needed   Please contact office for sooner follow up if symptoms do not improve or worsen or seek emergency care

## 2010-10-27 NOTE — Progress Notes (Signed)
Pacer remote 

## 2010-10-27 NOTE — Progress Notes (Signed)
Subjective:    Patient ID: Amy Lane, female    DOB: 09-Jan-1951, 60 y.o.   MRN: [id number]  HPI 60 y/o WF with  mult med problems as noted below... she is followed regularly by Drs Azucena Fallen for Cardiology, and Drs Purcell Nails for GI...  ~  April 16, 2009:  she states that she is feeling "great" & feels the Lexapro is really helping... had dual chamber pacemaker replaced 3/10 by DrKlein (for end-of-life), and she's gained 5# on Ensure supplements... she also saw DrNishan 12/10 for f/u of her Ehlers-Danlos, dysautonomia, SSS w/ pacer, carotid dis w/ tia, & MVP... CDopplers 2/11 stable... she is looking forward to an appt at [facility] in their [facility] for a second opinion regarding her Kraurosis Vulvae... OK TDAP today.  ~  October 13, 2009:  she was seen by Dr. Everlene Other at the [facility] at [facility] for her vulvar dermatitis & dryness & rec to use Crisco shortening Tid... she reports improved over the last 91mo- feeling better, in good spirits w/ the Lexapro helping, etc... she has f/u appts w/ DrKlein 6/11 & Walker Kehr 7/11> both felt she was doing well- noted normal atrial pacing w/ long latency on EKG & they are following for now... due for f/u fasting labs & Flu shot today... mild dermatitis on legs- LidexE written...  ~  May 23, 2010:  103mo ROV & she reports feeling "the best I have in yrs" attributed to stopping her Lipitor "too many neuro issues and not enough benefit" per DrWillis & [name];  She is looking forward to [place] vacation w/ family this summer; she was able to stop the Lexapro last January & doing fine without it now...  She denies CP, palpit, dizziness, syncope, edema, or cerebral ischemic symptoms... Due for f/u fasting labs> TChol 218, LDL 135, and she will continue to control this w/ low chol/ low fat diet...   ~08/05/2010 ov/Wert cc R ear fullness since got off airplane from [place] with slt muffled hearing, no flare of rhinitis or pain chewing.    ~10/27/2010 Acute OV  Complains of right sided sore  throat for 1 month, runny nose, ear fullness.  Seen by  ENT 2 times since June w/ recs for ear tube. This started after flight to [place].   Now has jaw pain with chewing hard food or meat. Painful to open at times. Has ov with dentist in 2 weeks NO redness or discolored mucus . No fever. No otc meds uses.   Pt informs me she was dx while traveling in [place] with left leg DVT (while on coumadin). INR was 2.7 prior to leaving.  Leg started swelling w/ pain on first day of arrival to [place]. Dx w/ left DVT on doppler on day 6 of vacation. INR was 4.7. Also told she probably had a PE due to dyspnea , tachycardia and diaphoresis . follow up [place] on return back home per Dr. [name] with no DVT seen.         Problem List:  ALLERGIC RHINITIS (ICD-477.9) - we discussed Rx w/ Zyrtek, Astepro Prn, etc... ~  CXR 3/11 showed left pacer, sternal wires, NAD...  MITRAL VALVE PROLAPSE (ICD-424.0) - followed by Walker Kehr... ~  2DEcho 11/08 showed myxomatous MV, annuloplasty ring, decr post leaflet excursion, norm LVH & wall motion... ~  NuclearStressTest 11/08 showed mild apical thinning... prev study 12/04 w/o ischemia or infarct & EF=68%... ~  she sees [place] every 6 months- for f/u  MVP (s/p MV repair), dysautonomia (s/p pacer), & Ehlers-Danlos Syndrome> notes reviewed...  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 hx AFib/Flutter w/ ablation & pacemaker placed ... she has Ehlers-Danlos synd w/ prolonged QT interval...   CARDIAC PACEMAKER IN SITU (ICD-V45.01) - f/u DrKlein w/ CTChest 4/09- neg. ~  dual chamber pacer changed 3/11 by DrKlein for end-of-life...  DYSAUTONOMIA (ICD-742.8) - prev on Proamatine 5mg  tabs per DrKlein, but this was stopped...  CEREBROVASCULAR DISEASE (ICD-437.9) - on ASA 81mg /d & COUMADIN (followed in the clinic)... ~  she had a Left CAE 11/05 by [facility] for symptomatic left  carotid stenosis w/ ulceration... ~  Carotid angiogram 5/08 by [facility] showed <30% right carotid stenosis ~  f/u CTA of Head & Neck 12/09 was neg= norm CTA head, & no signif carotid dis noted in neck. ~  CDoppler 2/11 showed stable mild carotid dis, left CAE w/ DPA is patent, 0-39% bilat ICA stenoses. ~  repear CDopplers 11/11 per Walker Kehr showed smooth plaque in right bulb & distal left CCA- stable; 0-39% bilat ICA stenoses...  HYPERCHOLESTEROLEMIA (ICD-272.0) - on diet alone now, prev on Lip20- this was stopped 1/12 by Walker Kehr & she feels much better off this med! ~  FLP 05/13/07 on Lip20 showed TChol 119, TG 50, HDL 55, LDL 54 ~  FLP 5/10 on Lip20 showed TChol 120, TG 43, HDL 58, LDL 54 ~  FLP 9/11 on Lip20 showed TChol 130, TG 54, HDL 59, LDL 60 ~  FLP 5/12 on diet alone showed TChol 218, TG 91, HDL 57, LDL 135... Continue low chol, low fat diet.  HYPOTHYROIDISM (ICD-244.9) - currently on SYNTHROID 7mcg/d... ~  labs 4/09 on Levoth88 showed TSH = 0.10... rec to decr Synthroid to 53mcg/d... ~  labs 9/09 on Levoth75 showed TSH = 1.67 ~  labs 5/10 on Levoth75 showed TSH = 2.92 ~  labs 9/11 on Levoth75 showed TSH= 1.37 ~  Labs 5/12 on Levothy75 showed TSH= 4.33  HEMORRHOIDS (ICD-455.6) - last colonoscopy 2/09 by DrJacobs showed only sm hems... f/u 55yrs.  GYN - followed by DrMcPhail and Dx w/ kraurosis vulvae 7/10... Prev on Premarin VagCream. ~  11/10: she discussed the Dx w/ me & will inquire about poss hyst/ BSO... ~  4/11:  eval in the [facility] by Dr Everlene Other showed vulvar dermatitis & dryness w/ rec for "Crisco" Tid!  FIBROMYALGIA (ICD-729.1) - she has been doing Yoga classes and this has helped...  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  Hx of TRANSIENT ISCHEMIC ATTACK (ICD-435.9) - she has been followed by DrWillis... s/p left carotid endarterectomy 11/05 by [facility]. ~  1/12: f/u eval by DrWillis reviewed...  Hx of SYNCOPE  (ICD-780.2) - felt to be neurally mediated, ? related to long QT, no recur since pacer placed...  DEPRESSION (ICD-311) - she takes LORAZEPAM 1mg Qhs for insomnia... she prev saw DrGraves for counselling in [place]... ~  11/10: poor appetite & some wt loss prob related to depression & we discussed trial LEXAPRO 10mg /d. ~  3/11: she is very pleased- feels better, gained 5# w/ Ensure, etc... ~  9/11:  "I feel great" & Lexapro continued... ~  She was able to stop the Lexapro 1/12 (also stopped the Lipitor) & has done well off it ever since...  CARCINOMA, BASAL CELL (ICD-173.9) - skin cancer removed from left leg by [facility] 2008. ~  12/11:  She reports bx of rash on legs= lichen planus per  DrHall, treated w/ topical ointment...  Health Maintenance: ~  GI:  followed by DrJacobs w/ colonoscopy 2/09 showing only sm hems... ~  GYN:  followed by DrMcPhail, and Dr. Everlene Other at [facility] vulvar clinic... ~  Labs 9/11 showed Vit D level = 39 & rec to start 1000 u OTC supplement... ~  Immunizations:  she had TDAP 3/11... she gets yearly Flu vaccine in the Fall of the yr...    Past Surgical History  Procedure Date  . Complex mitral valve repair     at the [facility]  . Left carotid endarterectomy 11/2003    by Dr. Amada Kingfisher  . Pacemaker insertion     medtronic kappa 901    Outpatient Encounter Prescriptions as of 05/23/2010  Medication Sig Dispense Refill  . acebutolol (SECTRAL) 200 MG capsule Take one tablet daily       . aspirin 81 MG chewable tablet Chew 81 mg by mouth daily.        Marland Kitchen levothyroxine (SYNTHROID, LEVOTHROID) 75 MCG tablet Take 75 mcg by mouth daily.        Marland Kitchen LORazepam (ATIVAN) 1 MG tablet Take 1 mg by mouth every 8 (eight) hours.        Marland Kitchen warfarin (COUMADIN) 5 MG tablet Take by mouth as directed.           Review of Systems Constitutional:   No  weight loss, night sweats,  Fevers, chills, fatigue, or  lassitude.  HEENT:   No headaches,  Difficulty swallowing,   Tooth/dental problems, or  Sore throat,                No sneezing, itching, ear ache,  +nasal congestion, post nasal drip,   CV:  No chest pain,  Orthopnea, PND, swelling in lower extremities, anasarca, dizziness, palpitations, syncope.   GI  No heartburn, indigestion, abdominal pain, nausea, vomiting, diarrhea, change in bowel habits, loss of appetite, bloody stools.   Resp: No shortness of breath with exertion or at rest.  No excess mucus, no productive cough,  No non-productive cough,  No coughing up of blood.  No change in color of mucus.  No wheezing.  No chest wall deformity  Skin: no rash or lesions.  GU: no dysuria, change in color of urine, no urgency or frequency.  No flank pain, no hematuria   MS:  No joint pain or swelling.  No decreased range of motion.  No back pain.  Psych:  No change in mood or affect. No depression or anxiety.  No memory loss.        Objective:   Physical Exam     WD, Thin, 60 y/o WF in NAD... Wt  141   >  134 08/05/2010 >>130lbs 10/27/2010   GENERAL:  Alert & oriented; pleasant & cooperative... HEENT:  [place]/AT,  EACs-clear, TMs-wnl, NOSE-clear drainage, THROAT-clear & wnl. Tender along upper mandible, +click  NECK:  Supple w/ fair ROM; no JVD; normal carotid impulses w/o bruits, left CAE scar; no thyromegaly or nodules palpated; no lymphadenopathy. CHEST:  Clear to P & A; without wheezes/ rales/ or rhonchi heard... HEART:  Regular Rhythm; pacer on left, without murmurs/ rubs/ or gallops detected... ABDOMEN:  Soft & nontender; normal bowel sounds; no organomegaly or masses palpated... EXT: without deformities or arthritic changes; no varicose veins/ venous insuffic/ or edema. NEURO:  CN's intact;  no focal neuro deficits... DERM:  Clear     Assessment & Plan:

## 2010-10-27 NOTE — Patient Instructions (Signed)
Nasonex 2 puffs Twice daily   Allegra 180mg  daily  Saline nasal rinses As needed   Celebrex 200mg  daily   With food for 6 days  Warm compresses to jaw As needed   Please contact office for sooner follow up if symptoms do not improve or worsen or seek emergency care

## 2010-10-27 NOTE — Assessment & Plan Note (Signed)
Bilateral Jaw pain -appears to be TMJ Will use NSAIDS-Cox2 short term cautiously  Plan  Celebrex 200mg  daily   With food for 6 days  Warm compresses to jaw As needed

## 2010-10-31 ENCOUNTER — Encounter: Payer: Self-pay | Admitting: Internal Medicine

## 2010-11-09 ENCOUNTER — Ambulatory Visit (INDEPENDENT_AMBULATORY_CARE_PROVIDER_SITE_OTHER): Payer: No Typology Code available for payment source | Admitting: *Deleted

## 2010-11-09 DIAGNOSIS — I4891 Unspecified atrial fibrillation: Secondary | ICD-10-CM

## 2010-11-09 DIAGNOSIS — G459 Transient cerebral ischemic attack, unspecified: Secondary | ICD-10-CM

## 2010-11-09 DIAGNOSIS — I059 Rheumatic mitral valve disease, unspecified: Secondary | ICD-10-CM

## 2010-11-09 LAB — POCT INR: INR: 3.3

## 2010-11-09 MED ORDER — WARFARIN SODIUM 5 MG PO TABS: ORAL_TABLET | ORAL | Status: DC

## 2010-11-17 ENCOUNTER — Ambulatory Visit: Payer: No Typology Code available for payment source | Admitting: Professional

## 2010-11-21 ENCOUNTER — Other Ambulatory Visit (INDEPENDENT_AMBULATORY_CARE_PROVIDER_SITE_OTHER): Payer: No Typology Code available for payment source

## 2010-11-21 ENCOUNTER — Other Ambulatory Visit: Payer: Self-pay | Admitting: Pulmonary Disease

## 2010-11-21 ENCOUNTER — Ambulatory Visit (INDEPENDENT_AMBULATORY_CARE_PROVIDER_SITE_OTHER): Payer: No Typology Code available for payment source | Admitting: Pulmonary Disease

## 2010-11-21 DIAGNOSIS — I495 Sick sinus syndrome: Secondary | ICD-10-CM

## 2010-11-21 DIAGNOSIS — I059 Rheumatic mitral valve disease, unspecified: Secondary | ICD-10-CM

## 2010-11-21 DIAGNOSIS — F3289 Other specified depressive episodes: Secondary | ICD-10-CM

## 2010-11-21 DIAGNOSIS — Z23 Encounter for immunization: Secondary | ICD-10-CM

## 2010-11-21 DIAGNOSIS — E7800 Pure hypercholesterolemia, unspecified: Secondary | ICD-10-CM

## 2010-11-21 DIAGNOSIS — F329 Major depressive disorder, single episode, unspecified: Secondary | ICD-10-CM

## 2010-11-21 DIAGNOSIS — G459 Transient cerebral ischemic attack, unspecified: Secondary | ICD-10-CM

## 2010-11-21 DIAGNOSIS — J309 Allergic rhinitis, unspecified: Secondary | ICD-10-CM

## 2010-11-21 DIAGNOSIS — I4891 Unspecified atrial fibrillation: Secondary | ICD-10-CM

## 2010-11-21 DIAGNOSIS — Q078 Other specified congenital malformations of nervous system: Secondary | ICD-10-CM

## 2010-11-21 DIAGNOSIS — E039 Hypothyroidism, unspecified: Secondary | ICD-10-CM

## 2010-11-21 DIAGNOSIS — Q796 Ehlers-Danlos syndrome, unspecified: Secondary | ICD-10-CM

## 2010-11-21 DIAGNOSIS — Z950 Presence of cardiac pacemaker: Secondary | ICD-10-CM

## 2010-11-21 LAB — CBC WITH DIFFERENTIAL/PLATELET
Eosinophils Relative: 2.1 % (ref 0.0–5.0)
HCT: 41.5 % (ref 36.0–46.0)
Lymphocytes Relative: 27.3 % (ref 12.0–46.0)
Lymphs Abs: 1.5 10*3/uL (ref 0.7–4.0)
Monocytes Relative: 9.5 % (ref 3.0–12.0)
Neutrophils Relative %: 60.7 % (ref 43.0–77.0)
Platelets: 161 10*3/uL (ref 150.0–400.0)
WBC: 5.4 10*3/uL (ref 4.5–10.5)

## 2010-11-21 LAB — BASIC METABOLIC PANEL
BUN: 11 mg/dL (ref 6–23)
GFR: 76.03 mL/min (ref >60.00)
Glucose, Bld: 93 mg/dL (ref 70–99)
Potassium: 4.5 mEq/L (ref 3.5–5.1)

## 2010-11-21 LAB — TSH: TSH: 1.51 u[IU]/mL (ref 0.35–5.50)

## 2010-11-21 LAB — LIPID PANEL
HDL: 68 mg/dL (ref >39.00)
Triglycerides: 86 mg/dL (ref 0.0–149.0)

## 2010-11-21 LAB — HEPATIC FUNCTION PANEL
AST: 26 U/L (ref 0–37)
Albumin: 4.4 g/dL (ref 3.5–5.2)
Total Protein: 7.9 g/dL (ref 6.0–8.3)

## 2010-11-21 MED ORDER — MOMETASONE FUROATE 50 MCG/ACT NA SUSP: 2.0000 | Freq: Two times a day (BID) | NASAL | Status: DC

## 2010-11-21 MED ORDER — LORAZEPAM 1 MG PO TABS: 1.0000 mg | ORAL_TABLET | Freq: Every evening | ORAL | Status: DC | PRN

## 2010-11-21 MED ORDER — CELECOXIB 200 MG PO CAPS: 200.0000 mg | ORAL_CAPSULE | Freq: Two times a day (BID) | ORAL | Status: AC

## 2010-11-21 NOTE — Patient Instructions (Signed)
Today we updated your med list in our EPIC system...    Continue your current medications the same...  We refilled your Celebrex, Nasonex, and Lorazepam...  Today we did your follow up fasting blood work...    Please call the PHONE TREE in a few days for your results...    Dial [id number] & when prompted enter your patient number followed by the # symbol...    Your patient number is:  [id number]#  Stay as active as poss...  Call for any problems...  Let's plan a follow up visit in 6 months.Marland KitchenMarland Kitchen

## 2010-11-21 NOTE — Progress Notes (Signed)
Subjective:    Patient ID: Amy Lane, female    DOB: 06-Mar-1950, 60 y.o.   MRN: [id number]  HPI  60 y/o WF here for a follow up visit... she has mult med problems as noted below... she is followed regularly by Drs Azucena Fallen for Cardiology, and Drs Purcell Nails for GI...  ~  April 16, 2009:  she states that she is feeling "great" & feels the Lexapro is really helping... had dual chamber pacemaker replaced 3/10 by DrKlein (for prev pacer end-of-life), and she's gained 5# on Ensure supplements... she also saw DrNishan 12/10 for f/u of her Ehlers-Danlos, dysautonomia, SSS w/ pacer, carotid dis w/ tia, & MVP... CDopplers 2/11 stable... she is looking forward to an appt at [facility] in their [facility] for a second opinion regarding her Kraurosis Vulvae... OK TDAP today.  ~  October 13, 2009:  she was seen by Dr. Everlene Other at the [facility] at [facility] for her vulvar dermatitis & dryness & rec to use Crisco shortening Tid... she reports improved over the last 109mo- feeling better, in good spirits w/ the Lexapro helping, etc... she had f/u appts w/ DrKlein 6/11 & Walker Kehr 7/11> both felt she was doing well- noted normal atrial pacing w/ long latency on EKG & they are following for now... due for f/u fasting labs & Flu shot today... mild dermatitis on legs- LidexE written...  ~  May 23, 2010:  34mo ROV & she reports feeling "the best I have in yrs" attributed to stopping her Lipitor "too many neuro issues and not enough benefit" per DrWillis & [name];  She is looking forward to [place] vacation w/ family this summer; she was able to stop the Lexapro last January & doing fine without it now...  She denies CP, palpit, dizziness, syncope, edema, or cerebral ischemic symptoms... Due for f/u fasting labs> TChol 218, LDL 135, and she will continue to control this w/ low chol/ low fat diet...  ~  November 21, 2010:  109mo ROV & she reports that while in [place] this past summer she developed a "blood  clot" meaning DVT despite being on her Coumadin & INR was 2.7 prior to the trip (when rechecked in [place] it was 4.5)> left leg swelling, pain, doppler performed by physician in [place] was pos, they questioned poss PTE due to incr dyspnea, treated w/ Lovenox along the way & f/u protimes on her Coumadin, but didn't stop her travels;  When back home checked w/ Walker Kehr- he thought bleed into muscle from prolonged INR of 4.5; she went to see DrNorris but he couldn't corroborate & treated her w/ compression hose;  Now back to baseline- swelling down, feeling better, back into exercise program & breathing better, followed in the Coumadin Clinic...    Also c/o URI, allergy symptoms, pain in ear & jaw> persistent discomfort treated w/ antihist, Nasonex, Pred, nasal saline, Celebrex, etc; DrByers planned tubes in her ears but she finally improved ~75% she says so she cancelled the tubes; they were going to send her to orthodontist vs oral surg for the TMJ & she cancelled that too...     We are going to check Fasting labs & give her the 2012 Flu vaccine today...         Problem List:  ALLERGIC RHINITIS (ICD-477.9) - we discussed Rx w/ Zyrtek, Astepro Prn, etc... ~  CXR 3/11 showed left pacer, sternal wires, NAD...  MITRAL VALVE PROLAPSE (ICD-424.0) - followed by Walker Kehr... ~  2DEcho 11/08 showed myxomatous MV, annuloplasty ring, decr post leaflet excursion, norm LVH & wall motion... ~  NuclearStressTest 11/08 showed mild apical thinning... prev study 12/04 w/o ischemia or infarct & EF=68%... ~  she sees [place] every 6 months- for f/u MVP (s/p MV repair), dysautonomia (s/p pacer), & Ehlers-Danlos Syndrome> notes reviewed...  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 hx AFib/Flutter w/ ablation & pacemaker placed ... she has Ehlers-Danlos synd w/ prolonged QT interval...   CARDIAC PACEMAKER IN SITU (ICD-V45.01) - f/u DrKlein w/ CTChest 4/09- neg. ~   dual chamber pacer changed 3/11 by DrKlein for end-of-life...  DYSAUTONOMIA (ICD-742.8) - prev on Proamatine 5mg  tabs per DrKlein, but this was stopped...  CEREBROVASCULAR DISEASE (ICD-437.9) - on ASA 81mg /d & COUMADIN (followed in the clinic)... ~  she had a Left CAE 11/05 by [facility] for symptomatic left carotid stenosis w/ ulceration... ~  Carotid angiogram 5/08 by [facility] showed <30% right carotid stenosis ~  f/u CTA of Head & Neck 12/09 was neg= norm CTA head, & no signif carotid dis noted in neck. ~  CDoppler 2/11 showed stable mild carotid dis, left CAE w/ DPA is patent, 0-39% bilat ICA stenoses. ~  repear CDopplers 11/11 per Walker Kehr showed smooth plaque in right bulb & distal left CCA- stable; 0-39% bilat ICA stenoses...  ?DVT & poss PTE while on vacation in [place] during the summer of 2012 >> pt was on Coumadin & INR was therapeutic; she had pain & swelling in left leg w/ a pos doppler reported by a [place] physician; she was also SOB & they questioned poss PTE but didn't get scan or further eval;  On return to [place] DrNishan wondered if she might have bled into the leg muscles & sent her to DrNorris for Ortho eval; he could not corroborate the theory & treated her w/ compression stockings & she gradually improved towards her baseline...  HYPERCHOLESTEROLEMIA (ICD-272.0) - on diet alone now, prev on Lip20- this was stopped 1/12 by Walker Kehr & she feels much better off this med! ~  FLP 05/13/07 on Lip20 showed TChol 119, TG 50, HDL 55, LDL 54 ~  FLP 5/10 on Lip20 showed TChol 120, TG 43, HDL 58, LDL 54 ~  FLP 9/11 on Lip20 showed TChol 130, TG 54, HDL 59, LDL 60 ~  FLP 5/12 on diet alone showed TChol 218, TG 91, HDL 57, LDL 135... Continue low chol, low fat diet. ~  FLP 10/12 showed TChol 231, TG 86, HDL 68, LDL 151... I rec the Lipid CLinic.  HYPOTHYROIDISM (ICD-244.9) - currently on SYNTHROID 72mcg/d... ~  labs 4/09 on Levoth88 showed TSH = 0.10... rec to decr Synthroid to 19mcg/d... ~   labs 9/09 on Levoth75 showed TSH = 1.67 ~  labs 5/10 on Levoth75 showed TSH = 2.92... Continue same. ~  labs 9/11 on Levoth75 showed TSH= 1.37 ~  Labs 5/12 on Levothy75 showed TSH= 4.33 ~  Labs 10/12 on Levothy75 showed TSH= 1.51... Continue same.  HEMORRHOIDS (ICD-455.6) - last colonoscopy 2/09 by DrJacobs showed only sm hems... f/u 53yrs.  GYN - followed by DrMcPhail and Dx w/ kraurosis vulvae 7/10... Prev on Premarin VagCream. ~  11/10: she discussed the Dx w/ me & will inquire about poss hyst/ BSO... ~  4/11:  eval in the [facility] by Dr Everlene Other showed vulvar dermatitis & dryness w/ rec for "Crisco" Tid!  FIBROMYALGIA (ICD-729.1) - she has been doing Yoga classes and  this has helped...  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  Hx of TRANSIENT ISCHEMIC ATTACK (ICD-435.9) - on ASA 81mg /d & COUMADIN> she has been followed by DrWillis; s/p left carotid endarterectomy 11/05 by [facility]. ~  1/12: f/u eval by DrWillis reviewed...  Hx of SYNCOPE (ICD-780.2) - felt to be neurally mediated, ? related to long QT, no recur since pacer placed...  DEPRESSION (ICD-311) - she takes LORAZEPAM 1mg Qhs for insomnia... she prev saw DrGraves for counselling in [place]... ~  11/10: poor appetite & some wt loss prob related to depression & we discussed trial LEXAPRO 10mg /d. ~  3/11: she is very pleased- feels better, gained 5# w/ Ensure, etc... ~  9/11:  "I feel great" & Lexapro continued... ~  She was able to stop the Lexapro 1/12 (also stopped the Lipitor) & has done well off it ever since...  CARCINOMA, BASAL CELL (ICD-173.9) - skin cancer removed from left leg by [facility] 2008. ~  12/11:  She reports bx of rash on legs= lichen planus per DrHall, treated w/ topical ointment...  Health Maintenance: ~  GI:  followed by DrJacobs w/ colonoscopy 2/09 showing only sm hems... ~  GYN:  followed by DrMcPhail, and Dr. Everlene Other at [facility] vulvar clinic... ~  Labs  9/11 showed Vit D level = 39 & rec to start 1000 u OTC supplement... ~  Immunizations:  she had TDAP 3/11... she gets yearly Flu vaccine in the Fall of the yr...    Past Surgical History  Procedure Date  . Complex mitral valve repair     at the [facility]  . Left carotid endarterectomy 11/2003    by Dr. Amada Kingfisher  . Pacemaker insertion     medtronic kappa [phone]    Outpatient Encounter Prescriptions as of 11/21/2010  Medication Sig Dispense Refill  . acebutolol (SECTRAL) 200 MG capsule Take 1 capsule (200 mg total) by mouth daily. Take one tablet daily  90 capsule  3  . aspirin 81 MG tablet Take 81 mg by mouth daily.        . fexofenadine (ALLEGRA) 180 MG tablet Take 180 mg by mouth daily.        Marland Kitchen levothyroxine (SYNTHROID, LEVOTHROID) 75 MCG tablet Take 1 tablet (75 mcg total) by mouth daily.  90 tablet  3  . LORazepam (ATIVAN) 1 MG tablet Take 1 mg by mouth at bedtime as needed.        . mometasone (NASONEX) 50 MCG/ACT nasal spray Place 2 sprays into the nose 2 (two) times daily.  17 g  6  . warfarin (COUMADIN) 5 MG tablet Take as directed by the coumadin clinic.  145 tablet  1    ALLERGIRES:  All QT drugs, and Adhesive Tape...   Current Medications, Allergies, Past Medical History, Past Surgical History, Family History, and Social History were reviewed in [employer] record.    Review of Systems         See HPI - all other systems neg except as noted... The patient complains of dyspnea on exertion.  The patient denies anorexia, fever, weight loss, weight gain, vision loss, decreased hearing, hoarseness, chest pain, syncope, peripheral edema, prolonged cough, headaches, hemoptysis, abdominal pain, melena, hematochezia, severe indigestion/heartburn, hematuria, incontinence, muscle weakness, suspicious skin lesions, transient blindness, difficulty walking, depression, unusual weight change, abnormal bleeding, enlarged lymph nodes, and angioedema.      Objective:   Physical Exam     WD, Thin, 60 y/o WF  in NAD... she is 5'11"Tall and 141# = BMI~20... Vital Signs:  Reviewed... GENERAL:  Alert & oriented; pleasant & cooperative... HEENT:  [place]/AT, EOM-wnl, PERRLA, EACs-clear, TMs-wnl, NOSE-clear, THROAT-clear & wnl. NECK:  Supple w/ fair ROM; no JVD; normal carotid impulses w/o bruits, left CAE scar; no thyromegaly or nodules palpated; no lymphadenopathy. CHEST:  Clear to P & A; without wheezes/ rales/ or rhonchi heard... HEART:  Regular Rhythm; pacer on left, without murmurs/ rubs/ or gallops detected... ABDOMEN:  Soft & nontender; normal bowel sounds; no organomegaly or masses palpated... EXT: without deformities or arthritic changes; no varicose veins/ venous insuffic/ or edema. NEURO:  CN's intact;  no focal neuro deficits... DERM:  s/p skin cancer removed from left leg; min rash noted...   Assessment & Plan:   ?DVT while in [place]> moot issue at present; stable on her Coumadin via CC; leg swelling diminished on the compression hose, no salt, elevation, etc; she is back to her exercise program & feeling better...  URI/ Allergy> prolonged symptoms finally improved & she estimates 75% better; rec to use Allegra, Nasal Saline, Nasonex; also has Celebrex for her jaw pain...  MVP/ MV Repair> Tachy-Brady/ PACER> Dysautonomia>  Followed by Walker Kehr & DrKlein, doing well on Coumadin & Sectral, continue same...  Cerebrovasc Dis>  Also takes ASA 81mg /d, no cerebral ischemic symptoms...  CHOL>  She feels much better off the Lipitor, & trying to control the lipids on diet alone, but FLP is worse today> rec Lipid Clinic.  Hypothyroid>  Stable on the Synthroid 28mcg/d...  GYN>  Stable & followed at [facility]...  EHLERS-DANLOS>  Aware, stable, she has been feeling better & in great spirits...  Other medical problems as noted.Marland KitchenMarland Kitchen

## 2010-11-28 ENCOUNTER — Ambulatory Visit (INDEPENDENT_AMBULATORY_CARE_PROVIDER_SITE_OTHER): Payer: No Typology Code available for payment source | Admitting: *Deleted

## 2010-11-28 ENCOUNTER — Encounter: Payer: Self-pay | Admitting: Pulmonary Disease

## 2010-11-28 DIAGNOSIS — I4891 Unspecified atrial fibrillation: Secondary | ICD-10-CM

## 2010-11-28 DIAGNOSIS — G459 Transient cerebral ischemic attack, unspecified: Secondary | ICD-10-CM

## 2010-11-28 DIAGNOSIS — I059 Rheumatic mitral valve disease, unspecified: Secondary | ICD-10-CM

## 2010-11-28 LAB — POCT INR: INR: 3.4

## 2010-11-29 ENCOUNTER — Telehealth: Payer: Self-pay | Admitting: Pulmonary Disease

## 2010-11-29 DIAGNOSIS — E7800 Pure hypercholesterolemia, unspecified: Secondary | ICD-10-CM

## 2010-11-29 NOTE — Telephone Encounter (Signed)
lmomtcb for pt to discuss her lab results per SN.

## 2010-11-29 NOTE — Telephone Encounter (Signed)
Called and spoke with pt about her lab results per SN.  See results note.  Pt is aware of results of labs and also of appt with lipid clinic that we will schedule for her.

## 2010-12-01 ENCOUNTER — Ambulatory Visit: Payer: No Typology Code available for payment source | Admitting: Cardiovascular Disease

## 2010-12-01 ENCOUNTER — Ambulatory Visit: Payer: No Typology Code available for payment source

## 2010-12-05 ENCOUNTER — Ambulatory Visit (INDEPENDENT_AMBULATORY_CARE_PROVIDER_SITE_OTHER): Payer: No Typology Code available for payment source

## 2010-12-05 DIAGNOSIS — E7800 Pure hypercholesterolemia, unspecified: Secondary | ICD-10-CM

## 2010-12-05 NOTE — Patient Instructions (Signed)
Start Crestor 5mg  on Monday, Wednesday and Friday.  If you have any problems, please call us at [phone].   Try to start exercising at least 5 minutes per day.   Recheck labs in 6 weeks.

## 2010-12-06 MED ORDER — ROSUVASTATIN CALCIUM 5 MG PO TABS: ORAL_TABLET | ORAL | Status: DC

## 2010-12-06 NOTE — Assessment & Plan Note (Signed)
Pt's current cholesterol: TC- 231 (goal<200), TG- 86 (goal<150), HDL- 68 (goal>45), and LDL- 151 (goal<70).  LFTs are WNL.  Had long discussion with patient about medication options, including alternate statin to Lipitor.  Given her success with statin in the past, would like to try Crestor 5mg  M, W, and F first.  If patient has any neurological symptoms, will stop and try another option other than statin.  Have asked patient to start exercising for 5 minutes every day.  Recheck labs in 6 weeks.

## 2010-12-06 NOTE — Progress Notes (Signed)
HPI  Amy Lane is a 60 yo F who presents to Lipid Clinic for initial evaluation.  She was referred by Dr. Kriste Basque after her LDL continued to increase off cholesterol medications.  Her PMH is significant for mitral valve prolapse, cerebrovascular disease and TIAs, and Ehlers-Danlos syndrome.  Family history is insignificant for heart disease but she has recently had 2 siblings diagnosed with DM.  Her cholesterol has previously been controlled with Lipitor.  She stopped this in January 2012.  She was having difficulties with unsteady gait, word finding, and "foggy" feeling.  Her neurologist suggested stopping statin and the symptoms improved.  Due to this, pt is hesitant to start statin but is open to trying another agent.   Review of diet shows patient has a very healthy diet.  For breakfast she will have a bowl of Cheerios and a tall white mocha coffee.  She likes to eat a snack/small meal about every 2 hours throughout the day.  Her morning snack is a banana or fruit.  Lunch may be brown rice, crackers, or tuna.  Dinner is a lot of vegetables and salads.  Pt used to be a vegetarian but she does it some meats such as chicken.  She does not eat any fried foods, sweets, junk food, or sodas.    Currently, pt has no routine exercise routine.  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    Current Outpatient Prescriptions  Medication Sig Dispense Refill  . acebutolol (SECTRAL) 200 MG capsule Take 1 capsule (200 mg total) by mouth daily. Take one tablet daily  90 capsule  3  . aspirin 81 MG tablet Take 81 mg by mouth daily.        . celecoxib (CELEBREX) 200 MG capsule Take 1 capsule (200 mg total) by mouth 2 (two) times daily.  30 capsule  11  . fexofenadine (ALLEGRA) 180 MG tablet Take 180 mg by mouth daily.        Marland Kitchen levothyroxine (SYNTHROID, LEVOTHROID) 75 MCG tablet Take 1 tablet (75 mcg total) by mouth daily.  90 tablet  3  . LORazepam (ATIVAN) 1 MG tablet Take 1  tablet (1 mg total) by mouth at bedtime as needed.  30 tablet  5  . mometasone (NASONEX) 50 MCG/ACT nasal spray Place 2 sprays into the nose 2 (two) times daily.  17 g  11  . warfarin (COUMADIN) 5 MG tablet Take as directed by the coumadin clinic.  145 tablet  1

## 2010-12-12 ENCOUNTER — Encounter: Payer: No Typology Code available for payment source | Admitting: *Deleted

## 2010-12-19 ENCOUNTER — Encounter (INDEPENDENT_AMBULATORY_CARE_PROVIDER_SITE_OTHER): Payer: No Typology Code available for payment source | Admitting: *Deleted

## 2010-12-19 ENCOUNTER — Ambulatory Visit (INDEPENDENT_AMBULATORY_CARE_PROVIDER_SITE_OTHER): Payer: No Typology Code available for payment source | Admitting: *Deleted

## 2010-12-19 DIAGNOSIS — I059 Rheumatic mitral valve disease, unspecified: Secondary | ICD-10-CM

## 2010-12-19 DIAGNOSIS — G459 Transient cerebral ischemic attack, unspecified: Secondary | ICD-10-CM

## 2010-12-19 DIAGNOSIS — I6529 Occlusion and stenosis of unspecified carotid artery: Secondary | ICD-10-CM

## 2010-12-19 DIAGNOSIS — I4891 Unspecified atrial fibrillation: Secondary | ICD-10-CM

## 2010-12-19 NOTE — Patient Instructions (Signed)
Continue same regimen 1.5 tablets daily except 2 tablets on Monday, Wednesday, Friday. Keep up with your greens. Return to clinic in 4 weeks.

## 2010-12-28 ENCOUNTER — Ambulatory Visit: Payer: No Typology Code available for payment source | Admitting: Cardiovascular Disease

## 2011-01-03 ENCOUNTER — Telehealth: Payer: Self-pay | Admitting: Pharmacist

## 2011-01-03 NOTE — Telephone Encounter (Signed)
Spoke with pt.  She left a message stating she was having problems with Crestor.  She took 5 doses of the Crestor (M,W, F then M, W of the next week).  She had muscle aches, severe leg pains at night, stomach aches and diarrhea.  She has stopped Crestor at this time.  She has appt with Lipid Clinic on 1/4.  Will wait until her appt to consider alternative therapies.

## 2011-01-16 ENCOUNTER — Ambulatory Visit (INDEPENDENT_AMBULATORY_CARE_PROVIDER_SITE_OTHER): Payer: No Typology Code available for payment source | Admitting: *Deleted

## 2011-01-16 DIAGNOSIS — I4891 Unspecified atrial fibrillation: Secondary | ICD-10-CM

## 2011-01-16 DIAGNOSIS — G459 Transient cerebral ischemic attack, unspecified: Secondary | ICD-10-CM

## 2011-01-16 DIAGNOSIS — I059 Rheumatic mitral valve disease, unspecified: Secondary | ICD-10-CM

## 2011-01-19 ENCOUNTER — Ambulatory Visit (INDEPENDENT_AMBULATORY_CARE_PROVIDER_SITE_OTHER): Payer: No Typology Code available for payment source | Admitting: *Deleted

## 2011-01-19 DIAGNOSIS — I4891 Unspecified atrial fibrillation: Secondary | ICD-10-CM

## 2011-01-19 DIAGNOSIS — Z950 Presence of cardiac pacemaker: Secondary | ICD-10-CM

## 2011-01-20 ENCOUNTER — Other Ambulatory Visit: Payer: Self-pay | Admitting: Internal Medicine

## 2011-01-20 ENCOUNTER — Encounter: Payer: Self-pay | Admitting: Internal Medicine

## 2011-01-20 LAB — REMOTE PACEMAKER DEVICE
AL IMPEDENCE PM: 437 Ohm
ATRIAL PACING PM: 74
BAMS-0001: 175 {beats}/min
BATTERY VOLTAGE: 2.79 V
RV LEAD AMPLITUDE: 16 mv
VENTRICULAR PACING PM: 0

## 2011-01-25 ENCOUNTER — Ambulatory Visit (INDEPENDENT_AMBULATORY_CARE_PROVIDER_SITE_OTHER): Payer: No Typology Code available for payment source | Admitting: Cardiovascular Disease

## 2011-01-25 ENCOUNTER — Other Ambulatory Visit (INDEPENDENT_AMBULATORY_CARE_PROVIDER_SITE_OTHER): Payer: No Typology Code available for payment source | Admitting: *Deleted

## 2011-01-25 ENCOUNTER — Other Ambulatory Visit: Payer: Self-pay | Admitting: Pulmonary Disease

## 2011-01-25 ENCOUNTER — Encounter: Payer: Self-pay | Admitting: Cardiovascular Disease

## 2011-01-25 VITALS — BP 100/62 | HR 77 | Ht 70.5 in | Wt 122.0 lb

## 2011-01-25 DIAGNOSIS — E7800 Pure hypercholesterolemia, unspecified: Secondary | ICD-10-CM

## 2011-01-25 DIAGNOSIS — I4891 Unspecified atrial fibrillation: Secondary | ICD-10-CM

## 2011-01-25 DIAGNOSIS — Z950 Presence of cardiac pacemaker: Secondary | ICD-10-CM

## 2011-01-25 DIAGNOSIS — I059 Rheumatic mitral valve disease, unspecified: Secondary | ICD-10-CM

## 2011-01-25 DIAGNOSIS — Q078 Other specified congenital malformations of nervous system: Secondary | ICD-10-CM

## 2011-01-25 LAB — HEPATIC FUNCTION PANEL
AST: 22 U/L (ref 0–37)
Alkaline Phosphatase: 52 U/L (ref 39–117)
Bilirubin, Direct: 0.1 mg/dL (ref 0.0–0.3)
Total Bilirubin: 0.5 mg/dL (ref 0.3–1.2)

## 2011-01-25 LAB — LDL CHOLESTEROL, DIRECT: Direct LDL: 141.4 mg/dL

## 2011-01-25 LAB — LIPID PANEL
HDL: 66.9 mg/dL (ref >39.00)
Total CHOL/HDL Ratio: 3
VLDL: 11.6 mg/dL (ref 0.0–40.0)

## 2011-01-25 NOTE — Assessment & Plan Note (Signed)
Would start on red yeast rice and Zetia  No further statin trials

## 2011-01-25 NOTE — Assessment & Plan Note (Signed)
Stable with no postural signs and no dizzyness

## 2011-01-25 NOTE — Assessment & Plan Note (Signed)
F/U Dr Graciela Husbands.  She indicates home checks ok But I will have him review ECG

## 2011-01-25 NOTE — Progress Notes (Signed)
Amy Lane is seen in followup for sinus bradycardia status post pacemaker, status post generator replacement and lead repair March 2011.  She also has a history of Ehlers-Danlos syndrome in the context of a marfanoid habitus, mitral valve prolapse status post mitral valve repair at the [facility] clinic with prior TIA on chronic Coumadin as well as syncope due to diagnosis of long QT syndrome. SHe has had significant dysautonomic symptoms consistent with POTS S/P left CEA with normal duplex 11/11  Discussed low carb diet and exercise program for weight gain  Went to [place] last year and got  Superficial phlebitis with elevated INR and bleeding with RLE swelling. No DVT or compartment syndrome. Reviewed numerous notes from [place] which were hard to decipher. INR was elevated at 4.3. I suspect she had a muscle strain from all the walking and bled into it.   Cholesterol elevated Saw lipid clinic but did not tolerate crestor and previously intolerant to lipitor.  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  All other systems reviewed and negative  General: Affect appropriate Healthy:  appears stated age HEENT: normal Neck supple with no adenopathy JVP normal no bruits no thyromegaly Lungs clear with no wheezing and good diaphragmatic motion Heart:  S1/S2 no murmur,rub, gallop or click PMI normal Abdomen: benighn, BS positve, no tenderness, no AAA no bruit.  No HSM or HJR Distal pulses intact with no bruits No edema Neuro non-focal Skin warm and dry No muscular weakness   Current Outpatient Prescriptions  Medication Sig Dispense Refill    . acebutolol (SECTRAL) 200 MG capsule Take 1 capsule (200 mg total) by mouth daily. Take one tablet daily  90 capsule  3  . aspirin 81 MG tablet Take 81 mg by mouth daily.        Marland Kitchen levothyroxine (SYNTHROID, LEVOTHROID) 75 MCG tablet Take 1 tablet (75 mcg total) by mouth daily.  90 tablet  3  . LORazepam (ATIVAN) 1 MG tablet Take 1 tablet (1 mg total) by mouth at bedtime as needed.  30 tablet  5  . mometasone (NASONEX) 50 MCG/ACT nasal spray Place 2 sprays into the nose 2 (two) times daily.  17 g  11  . warfarin (COUMADIN) 5 MG tablet Take as directed by the coumadin clinic.  145 tablet  1    Allergies  Other  Electrocardiogram:  Atrial pacing rate 77 somewhat long delay from P wave and atrial spike  ICRBBB normal ventricular complex  Assessment and Plan

## 2011-01-25 NOTE — Assessment & Plan Note (Signed)
S/P repair No murmur stable  SBE

## 2011-01-25 NOTE — Assessment & Plan Note (Signed)
Maint NSR A pacing  Continue coumadin with PAF/MV disease and previous CVA

## 2011-01-25 NOTE — Patient Instructions (Signed)
The current medical regimen is effective;  continue present plan and medications.  Follow up in 6 months with Dr Nishan.  You will receive a letter in the mail 2 months before you are due.  Please call us when you receive this letter to schedule your follow up appointment.   

## 2011-01-26 ENCOUNTER — Ambulatory Visit (INDEPENDENT_AMBULATORY_CARE_PROVIDER_SITE_OTHER): Payer: No Typology Code available for payment source | Admitting: *Deleted

## 2011-01-26 ENCOUNTER — Ambulatory Visit: Payer: No Typology Code available for payment source | Admitting: Pharmacist

## 2011-01-26 DIAGNOSIS — E7800 Pure hypercholesterolemia, unspecified: Secondary | ICD-10-CM

## 2011-01-26 DIAGNOSIS — I059 Rheumatic mitral valve disease, unspecified: Secondary | ICD-10-CM

## 2011-01-26 DIAGNOSIS — G459 Transient cerebral ischemic attack, unspecified: Secondary | ICD-10-CM

## 2011-01-26 DIAGNOSIS — I4891 Unspecified atrial fibrillation: Secondary | ICD-10-CM

## 2011-01-26 LAB — POCT INR: INR: 2.7

## 2011-01-26 MED ORDER — NIACIN ER (ANTIHYPERLIPIDEMIC) 500 MG PO TBCR: 500.0000 mg | EXTENDED_RELEASE_TABLET | Freq: Every day | ORAL | Status: DC

## 2011-01-26 NOTE — Progress Notes (Signed)
Remote pacer check  

## 2011-01-26 NOTE — Patient Instructions (Signed)
Start Niaspan 500mg  once daily in PM.  If you have flushing, you can try taking medication with a meal or taking your aspirin approximately 30 minutes prior to the Niaspan.   Continue your heart healthy diet.   Try to start exercising at least 1 hour per week.   Recheck labs in 6-8 weeks.

## 2011-01-26 NOTE — Progress Notes (Signed)
HPI  Amy Lane is a 61 yo F who presents to Lipid Clinic for follow-up evaluation.  At last visit, we started patient on Crestor 5mg  M, W, F.  She tried taking this for approximately 2 weeks but had to stop due to leg pains.  Due to multiple statin intolerances, will not retry these anymore.  Dr. [name] suggested Zetia or Red Yeast Rice as alternatives.   Review of diet shows patient has a very healthy diet and has even made some improvements.  For breakfast she will have a bowl of Cheerios and a tall white mocha coffee.  She likes to eat a snack/small meal about every 2 hours throughout the day.  Her morning snack is a banana or fruit.  Lunch may be brown rice, crackers, or tuna.  Dinner is a lot of vegetables and salads.  Pt used to be a vegetarian but she does it some meats such as chicken.  She does not eat any fried foods, sweets, junk food, or sodas.  She has lost ~5 lbs since last visit.   Currently, pt has no routine exercise routine.  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   Current Outpatient Prescriptions on File Prior to Visit  Medication Sig Dispense Refill  . acebutolol (SECTRAL) 200 MG capsule Take 1 capsule (200 mg total) by mouth daily. Take one tablet daily  90 capsule  3  . aspirin 81 MG tablet Take 81 mg by mouth daily.        Marland Kitchen LORazepam (ATIVAN) 1 MG tablet Take 1 tablet (1 mg total) by mouth at bedtime as needed.  30 tablet  5  . mometasone (NASONEX) 50 MCG/ACT nasal spray Place 2 sprays into the nose 2 (two) times daily.  17 g  11  . warfarin (COUMADIN) 5 MG tablet Take as directed by the coumadin clinic.  145 tablet  1  . levothyroxine (SYNTHROID, LEVOTHROID) 75 MCG tablet Take 1 tablet (75 mcg total) by mouth daily.  90 tablet  3   Allergies  Allergen Reactions  . Other     Allergic to all QT drugs---  . Statins     myalgias

## 2011-01-26 NOTE — Assessment & Plan Note (Addendum)
Pt's current cholesterol: TC- 231 (goal<200), TG- 58 (goal<150), HDL- 67 (goal>45), and LDL- 141 (goal<70).  LFTs are WNL.  Pt's cholesterol improved some with lifestyle changes alone.  Discussed all treatment options including fibrates, niacin, zetia, BAS, and OTC options.  Given patient's healthy diet, will not try Zetia at this time.  Would consider fibrate or Niacin.  Pt would prefer Niacin based on potential side effects.  Will start with Niaspan 500mg  once daily.  Pt aware to call if she has any problems, especially flushing.  She is going to start exercising as much as possible.  Will f/u in 1-2 months.

## 2011-02-09 ENCOUNTER — Encounter: Payer: Self-pay | Admitting: *Deleted

## 2011-02-21 ENCOUNTER — Ambulatory Visit: Payer: No Typology Code available for payment source | Admitting: Professional

## 2011-02-22 ENCOUNTER — Ambulatory Visit (INDEPENDENT_AMBULATORY_CARE_PROVIDER_SITE_OTHER): Payer: No Typology Code available for payment source | Admitting: Professional

## 2011-02-22 DIAGNOSIS — F4322 Adjustment disorder with anxiety: Secondary | ICD-10-CM

## 2011-02-23 ENCOUNTER — Ambulatory Visit (INDEPENDENT_AMBULATORY_CARE_PROVIDER_SITE_OTHER): Payer: No Typology Code available for payment source | Admitting: Pharmacist

## 2011-02-23 DIAGNOSIS — I059 Rheumatic mitral valve disease, unspecified: Secondary | ICD-10-CM

## 2011-02-23 DIAGNOSIS — I4891 Unspecified atrial fibrillation: Secondary | ICD-10-CM

## 2011-02-23 DIAGNOSIS — G459 Transient cerebral ischemic attack, unspecified: Secondary | ICD-10-CM

## 2011-02-23 LAB — POCT INR: INR: 2.3

## 2011-02-28 ENCOUNTER — Ambulatory Visit (INDEPENDENT_AMBULATORY_CARE_PROVIDER_SITE_OTHER): Payer: No Typology Code available for payment source | Admitting: Professional

## 2011-02-28 DIAGNOSIS — F4322 Adjustment disorder with anxiety: Secondary | ICD-10-CM

## 2011-03-07 ENCOUNTER — Ambulatory Visit (INDEPENDENT_AMBULATORY_CARE_PROVIDER_SITE_OTHER): Payer: No Typology Code available for payment source | Admitting: Professional

## 2011-03-07 ENCOUNTER — Telehealth: Payer: Self-pay | Admitting: Pulmonary Disease

## 2011-03-07 DIAGNOSIS — F4322 Adjustment disorder with anxiety: Secondary | ICD-10-CM

## 2011-03-07 NOTE — Telephone Encounter (Signed)
Contacted patient, patient c/o cough, sneezing, ear pain, and a pink left eye. Scheduled an appointment with TP on Thursday 03/09/2011 at 9:15.

## 2011-03-09 ENCOUNTER — Ambulatory Visit (INDEPENDENT_AMBULATORY_CARE_PROVIDER_SITE_OTHER): Payer: No Typology Code available for payment source | Admitting: Adult Health

## 2011-03-09 ENCOUNTER — Encounter: Payer: Self-pay | Admitting: Adult Health

## 2011-03-09 DIAGNOSIS — L659 Nonscarring hair loss, unspecified: Secondary | ICD-10-CM | POA: Insufficient documentation

## 2011-03-09 DIAGNOSIS — J309 Allergic rhinitis, unspecified: Secondary | ICD-10-CM

## 2011-03-09 NOTE — Progress Notes (Signed)
Subjective:    Patient ID: Amy Lane, female    DOB: 10-May-1950, 61 y.o.   MRN: [id number]  HPI  61 y/o WF ~  April 16, 2009:  she states that she is feeling "great" & feels the Lexapro is really helping... had dual chamber pacemaker replaced 3/10 by DrKlein (for prev pacer end-of-life), and she's gained 5# on Ensure supplements... she also saw DrNishan 12/10 for f/u of her Ehlers-Danlos, dysautonomia, SSS w/ pacer, carotid dis w/ tia, & MVP... CDopplers 2/11 stable... she is looking forward to an appt at [facility] in their [facility] for a second opinion regarding her Kraurosis Vulvae... OK TDAP today.  ~  October 13, 2009:  she was seen by Dr. Everlene Other at the [facility] at [facility] for her vulvar dermatitis & dryness & rec to use Crisco shortening Tid... she reports improved over the last 34mo- feeling better, in good spirits w/ the Lexapro helping, etc... she had f/u appts w/ DrKlein 6/11 & Walker Kehr 7/11> both felt she was doing well- noted normal atrial pacing w/ long latency on EKG & they are following for now... due for f/u fasting labs & Flu shot today... mild dermatitis on legs- LidexE written...  ~  May 23, 2010:  15mo ROV & she reports feeling "the best I have in yrs" attributed to stopping her Lipitor "too many neuro issues and not enough benefit" per DrWillis & [name];  She is looking forward to [place] vacation w/ family this summer; she was able to stop the Lexapro last January & doing fine without it now...  She denies CP, palpit, dizziness, syncope, edema, or cerebral ischemic symptoms... Due for f/u fasting labs> TChol 218, LDL 135, and she will continue to control this w/ low chol/ low fat diet...  ~  November 21, 2010:  34mo ROV & she reports that while in [place] this past summer she developed a "blood clot" meaning DVT despite being on her Coumadin & INR was 2.7 prior to the trip (when rechecked in [place] it was 4.5)> left leg swelling, pain, doppler performed by  physician in [place] was pos, they questioned poss PTE due to incr dyspnea, treated w/ Lovenox along the way & f/u protimes on her Coumadin, but didn't stop her travels;  When back home checked w/ Walker Kehr- he thought bleed into muscle from prolonged INR of 4.5; she went to see DrNorris but he couldn't corroborate & treated her w/ compression hose;  Now back to baseline- swelling down, feeling better, back into exercise program & breathing better, followed in the Coumadin Clinic...    Also c/o URI, allergy symptoms, pain in ear & jaw> persistent discomfort treated w/ antihist, Nasonex, Pred, nasal saline, Celebrex, etc; DrByers planned tubes in her ears but she finally improved ~75% she says so she cancelled the tubes; they were going to send her to orthodontist vs oral surg for the TMJ & she cancelled that too...     We are going to check Fasting labs & give her the 2012 Flu vaccine today...  03/09/2011 Acute OV  Complains of pinkness in both eyes, sneezing, HA with throbbing x4days. Has been using otc cold meds, and saline rinses. Eyes are getting better. No drainage. No fever. Sinus congestion is getting better. No symptoms today.   Also , complains of change in nail beds with ridges, splitting and dryness and hairloss x 3 months Has noticed over last year hair on head is thinning . She says over last 10  years, she has essentially no hair on legs or under arms.  She is on several cardiac meds and coumadin -long term. As she has an extensive cardiac hx along with underling Ehlers-Danlos Syndrome-see below. TSH 10/2010 was nml          Problem List:  ALLERGIC RHINITIS (ICD-477.9) - we discussed Rx w/ Zyrtek, Astepro Prn, etc... ~  CXR 3/11 showed left pacer, sternal wires, NAD...  MITRAL VALVE PROLAPSE (ICD-424.0) - followed by Walker Kehr... ~  2DEcho 11/08 showed myxomatous MV, annuloplasty ring, decr post leaflet excursion, norm LVH & wall motion... ~  NuclearStressTest 11/08 showed mild apical  thinning... prev study 12/04 w/o ischemia or infarct & EF=68%... ~  she sees [place] every 6 months- for f/u MVP (s/p MV repair), dysautonomia (s/p pacer), & Ehlers-Danlos Syndrome> notes reviewed...  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 hx AFib/Flutter w/ ablation & pacemaker placed ... she has Ehlers-Danlos synd w/ prolonged QT interval...   CARDIAC PACEMAKER IN SITU (ICD-V45.01) - f/u DrKlein w/ CTChest 4/09- neg. ~  dual chamber pacer changed 3/11 by DrKlein for end-of-life...  DYSAUTONOMIA (ICD-742.8) - prev on Proamatine 5mg  tabs per DrKlein, but this was stopped...  CEREBROVASCULAR DISEASE (ICD-437.9) - on ASA 81mg /d & COUMADIN (followed in the clinic)... ~  she had a Left CAE 11/05 by [facility] for symptomatic left carotid stenosis w/ ulceration... ~  Carotid angiogram 5/08 by [facility] showed <30% right carotid stenosis ~  f/u CTA of Head & Neck 12/09 was neg= norm CTA head, & no signif carotid dis noted in neck. ~  CDoppler 2/11 showed stable mild carotid dis, left CAE w/ DPA is patent, 0-39% bilat ICA stenoses. ~  repear CDopplers 11/11 per Walker Kehr showed smooth plaque in right bulb & distal left CCA- stable; 0-39% bilat ICA stenoses...  ?DVT & poss PTE while on vacation in [place] during the summer of 2012 >> pt was on Coumadin & INR was therapeutic; she had pain & swelling in left leg w/ a pos doppler reported by a [place] physician; she was also SOB & they questioned poss PTE but didn't get scan or further eval;  On return to [place] DrNishan wondered if she might have bled into the leg muscles & sent her to DrNorris for Ortho eval; he could not corroborate the theory & treated her w/ compression stockings & she gradually improved towards her baseline...  HYPERCHOLESTEROLEMIA (ICD-272.0) - on diet alone now, prev on Lip20- this was stopped 1/12 by Walker Kehr & she feels much better off this med! ~  FLP 05/13/07 on Lip20 showed TChol  119, TG 50, HDL 55, LDL 54 ~  FLP 5/10 on Lip20 showed TChol 120, TG 43, HDL 58, LDL 54 ~  FLP 9/11 on Lip20 showed TChol 130, TG 54, HDL 59, LDL 60 ~  FLP 5/12 on diet alone showed TChol 218, TG 91, HDL 57, LDL 135... Continue low chol, low fat diet. ~  FLP 10/12 showed TChol 231, TG 86, HDL 68, LDL 151... I rec the Lipid CLinic.  HYPOTHYROIDISM (ICD-244.9) - currently on SYNTHROID 77mcg/d... ~  labs 4/09 on Levoth88 showed TSH = 0.10... rec to decr Synthroid to 45mcg/d... ~  labs 9/09 on Levoth75 showed TSH = 1.67 ~  labs 5/10 on Levoth75 showed TSH = 2.92... Continue same. ~  labs 9/11 on Levoth75 showed TSH= 1.37 ~  Labs 5/12 on Levothy75 showed TSH= 4.33 ~  Labs 10/12 on Levothy75 showed TSH= 1.51.Marland KitchenMarland Kitchen  Continue same.  HEMORRHOIDS (ICD-455.6) - last colonoscopy 2/09 by DrJacobs showed only sm hems... f/u 42yrs.  GYN - followed by DrMcPhail and Dx w/ kraurosis vulvae 7/10... Prev on Premarin VagCream. ~  11/10: she discussed the Dx w/ me & will inquire about poss hyst/ BSO... ~  4/11:  eval in the [facility] by Dr Everlene Other showed vulvar dermatitis & dryness w/ rec for "Crisco" Tid!  FIBROMYALGIA (ICD-729.1) - she has been doing Yoga classes and this has helped...  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  Hx of TRANSIENT ISCHEMIC ATTACK (ICD-435.9) - on ASA 81mg /d & COUMADIN> she has been followed by DrWillis; s/p left carotid endarterectomy 11/05 by [facility]. ~  1/12: f/u eval by DrWillis reviewed...  Hx of SYNCOPE (ICD-780.2) - felt to be neurally mediated, ? related to long QT, no recur since pacer placed...  DEPRESSION (ICD-311) - she takes LORAZEPAM 1mg Qhs for insomnia... she prev saw DrGraves for counselling in [place]... ~  11/10: poor appetite & some wt loss prob related to depression & we discussed trial LEXAPRO 10mg /d. ~  3/11: she is very pleased- feels better, gained 5# w/ Ensure, etc... ~  9/11:  "I feel great" & Lexapro  continued... ~  She was able to stop the Lexapro 1/12 (also stopped the Lipitor) & has done well off it ever since...  CARCINOMA, BASAL CELL (ICD-173.9) - skin cancer removed from left leg by [facility] 2008. ~  12/11:  She reports bx of rash on legs= lichen planus per DrHall, treated w/ topical ointment...  Health Maintenance: ~  GI:  followed by DrJacobs w/ colonoscopy 2/09 showing only sm hems... ~  GYN:  followed by DrMcPhail, and Dr. Everlene Other at [facility] vulvar clinic... ~  Labs 9/11 showed Vit D level = 39 & rec to start 1000 u OTC supplement... ~  Immunizations:  she had TDAP 3/11... she gets yearly Flu vaccine in the Fall of the yr...    Past Surgical History  Procedure Date  . Complex mitral valve repair     at the [facility]  . Left carotid endarterectomy 11/2003    by Dr. Amada Kingfisher  . Pacemaker insertion     medtronic kappa [phone]    Outpatient Encounter Prescriptions as of 03/09/2011  Medication Sig Dispense Refill  . acebutolol (SECTRAL) 200 MG capsule Take 1 capsule (200 mg total) by mouth daily. Take one tablet daily  90 capsule  3  . aspirin 81 MG tablet Take 81 mg by mouth daily.        . clindamycin (CLEOCIN) 2 % vaginal cream Use as directed      . levothyroxine (SYNTHROID, LEVOTHROID) 75 MCG tablet Take 1 tablet (75 mcg total) by mouth daily.  90 tablet  3  . LORazepam (ATIVAN) 1 MG tablet Take 1 tablet (1 mg total) by mouth at bedtime as needed.  30 tablet  5  . mometasone (NASONEX) 50 MCG/ACT nasal spray Place 2 sprays into the nose 2 (two) times daily.  17 g  11  . niacin (NIASPAN) 500 MG CR tablet Take 1 tablet (500 mg total) by mouth at bedtime.  30 tablet  3  . warfarin (COUMADIN) 5 MG tablet Take as directed by the coumadin clinic.  145 tablet  1    ALLERGIRES:  All QT drugs, and Adhesive Tape...   Current Medications, Allergies, Past Medical History, Past Surgical History, Family History, and Social History were reviewed in [employer]  electronic  medical record.    Review of Systems Constitutional:   No  weight loss, night sweats,  Fevers, chills, +fatigue, or  lassitude.  HEENT:   No headaches,  Difficulty swallowing,  Tooth/dental problems, or  Sore throat,                 + sneezing, itching,  nasal congestion, post nasal drip,   CV:  No chest pain,  Orthopnea, PND, swelling in lower extremities, anasarca, dizziness, palpitations, syncope.   GI  No heartburn, indigestion, abdominal pain, nausea, vomiting, diarrhea, change in bowel habits, loss of appetite, bloody stools.   Resp: No shortness of breath with exertion or at rest.  No excess mucus, no productive cough,  No non-productive cough,  No coughing up of blood.  No change in color of mucus.  No wheezing.  No chest wall deformity  Skin: no rash or lesions.  GU: no dysuria, change in color of urine, no urgency or frequency.  No flank pain, no hematuria   MS:  No joint pain or swelling.  No decreased range of motion.  No back pain.  Psych:  No change in mood or affect. No depression or anxiety.  No memory loss.                Objective:   Physical Exam     WD, Thin, 61 y/o WF in NAD  Vital Signs:  Reviewed... GENERAL:  Alert & oriented; pleasant & cooperative... HEENT:  [place]/AT, EOM-wnl, PERRLA, EACs-clear, TMs-wnl, NOSE-clear, THROAT-clear & wnl. NECK:  Supple w/ fair ROM; no JVD; normal carotid impulses w/o bruits, left CAE scar; no thyromegaly or nodules palpated; no lymphadenopathy. CHEST:  Clear to P & A; without wheezes/ rales/ or rhonchi heard... HEART:  Regular Rhythm; pacer on left, without murmurs/ rubs/ or gallops detected... ABDOMEN:  Soft & nontender; normal bowel sounds; no organomegaly or masses palpated... EXT: without deformities or arthritic changes; no varicose veins/ venous insuffic/ or edema. NEURO: no focal neuro deficits... Derm: thinning hair along crown of head , nails with ridges and cracks noted.      Assessment & Plan:

## 2011-03-09 NOTE — Patient Instructions (Addendum)
Refer to Dermatology for hair loss and nail issues. - we will fax notes and recent labs.  Nasonex 2 puffs Twice daily  As needed  Nasal congestion  Allegra 180mg  daily As needed  Drainage  Saline nasal rinses As needed   Follow up with Dr. Kriste Basque as planned and As needed

## 2011-03-09 NOTE — Assessment & Plan Note (Signed)
Resolving  rec otc meds  No evidence of conjunctivitis

## 2011-03-09 NOTE — Assessment & Plan Note (Signed)
General hair loss with thinning hair  And nail issues.  ? Medication side effect vs heriditary.  She has been on long term coumadin and cardiac meds which have side effects of hair loss.  Will refer to dermatology for evaluation

## 2011-03-14 ENCOUNTER — Ambulatory Visit: Payer: No Typology Code available for payment source | Admitting: Professional

## 2011-03-21 ENCOUNTER — Ambulatory Visit (INDEPENDENT_AMBULATORY_CARE_PROVIDER_SITE_OTHER): Payer: No Typology Code available for payment source | Admitting: *Deleted

## 2011-03-21 ENCOUNTER — Other Ambulatory Visit: Payer: Self-pay | Admitting: Dermatology

## 2011-03-21 DIAGNOSIS — E7800 Pure hypercholesterolemia, unspecified: Secondary | ICD-10-CM

## 2011-03-21 LAB — LIPID PANEL: Cholesterol: 199 mg/dL (ref 0–200)

## 2011-03-21 LAB — HEPATIC FUNCTION PANEL
ALT: 15 U/L (ref 0–35)
AST: 19 U/L (ref 0–37)
Albumin: 3.8 g/dL (ref 3.5–5.2)

## 2011-03-22 ENCOUNTER — Ambulatory Visit (INDEPENDENT_AMBULATORY_CARE_PROVIDER_SITE_OTHER): Payer: No Typology Code available for payment source | Admitting: Professional

## 2011-03-22 DIAGNOSIS — F331 Major depressive disorder, recurrent, moderate: Secondary | ICD-10-CM

## 2011-03-23 ENCOUNTER — Ambulatory Visit (INDEPENDENT_AMBULATORY_CARE_PROVIDER_SITE_OTHER): Payer: No Typology Code available for payment source | Admitting: Pharmacist

## 2011-03-23 ENCOUNTER — Ambulatory Visit: Payer: No Typology Code available for payment source | Admitting: Pharmacist

## 2011-03-23 VITALS — Wt 123.5 lb

## 2011-03-23 DIAGNOSIS — I4891 Unspecified atrial fibrillation: Secondary | ICD-10-CM

## 2011-03-23 DIAGNOSIS — G459 Transient cerebral ischemic attack, unspecified: Secondary | ICD-10-CM

## 2011-03-23 DIAGNOSIS — I059 Rheumatic mitral valve disease, unspecified: Secondary | ICD-10-CM

## 2011-03-23 DIAGNOSIS — E7800 Pure hypercholesterolemia, unspecified: Secondary | ICD-10-CM

## 2011-03-23 NOTE — Patient Instructions (Signed)
1.  Continue niacin CR 500 mg by mouth at bedtime. 2.  Continue with your healthy diet and exercise regimen. 3.  We will call you to set up an appointment with the Lipid Clinic in 3-6 months, and if at goal, may follow up with PCP after that.

## 2011-03-23 NOTE — Progress Notes (Signed)
HPI  Ms. Fowers is a 61 yo female who presents to Lipid Clinic for evaluation.  Her PMH is significant for mitral valve prolapse, cerebrovascular disease and TIAs, and Ehlers-Danlos syndrome.  Family history is insignificant for heart disease but she has recently had 2 siblings diagnosed with DM.  Patient is currently taking Niaspan 500 mg and is tolerating medication well with no flushing.  Patient had attempted Crestor 5 mg MWF for 2 weeks and had to stop due to leg pains (she has had multiple statin intolerances and will not retry these).  Diet:  Patient eats a very healthy diet, and she used to be a vegetarian.  For breakfast, she usually has Cheerios with 1% milk, no sugar, and two hours after that, she will eat a banana.  She may have toast and a banana as a variation to her breakfast regimen.  For lunch, she either has a small salad or cottage cheese and tomato or plain tuna.  For dinner, she usually has vegetables and fish or chicken (baked usually).  Patient drinks mostly water and never soda.  For snacks, she may have nuts or cheese and crackers.  Exercise:  Patient has been walking more and working out in the yard in the last two weeks.  She anticipates that she will continue to be more active with warmer weather.  SH:  Patient does not smoke and does not drink alcohol (maybe one drink every six months).  Current Outpatient Prescriptions  Medication Sig Dispense Refill  . acebutolol (SECTRAL) 200 MG capsule Take 1 capsule (200 mg total) by mouth daily. Take one tablet daily  90 capsule  3  . aspirin 81 MG tablet Take 81 mg by mouth daily.        . clindamycin (CLEOCIN) 2 % vaginal cream Use as directed      . levothyroxine (SYNTHROID, LEVOTHROID) 75 MCG tablet Take 1 tablet (75 mcg total) by mouth daily.  90 tablet  3  . LORazepam (ATIVAN) 1 MG tablet Take 1 tablet (1 mg total) by mouth at bedtime as needed.  30 tablet  5  . mometasone (NASONEX) 50 MCG/ACT nasal spray Place 2 sprays into  the nose 2 (two) times daily.  17 g  11  . niacin (NIASPAN) 500 MG CR tablet Take 1 tablet (500 mg total) by mouth at bedtime.  30 tablet  3  . warfarin (COUMADIN) 5 MG tablet Take as directed by the coumadin clinic.  145 tablet  1   Allergies  Allergen Reactions  . Other     Allergic to all QT drugs---  . Statins     myalgias

## 2011-03-23 NOTE — Assessment & Plan Note (Signed)
Assessment: Patient's TC, TG, HDL, and LDL have all improved since the last visit.  LDL goal <70, but per patient, Dr. Fabio Bering RN had called her and told her that targeting the LDL goal of <70 may be not realistic for her.  Goal of therapy: prevent cardiovascular events.  TC 199 (01/25/11- 231); TG 55 (01/25/11- 58); HDL 75.8 (01/25/11- 66.9); LDL 112 (01/25/11- 141).  Patient's weight is 123 lbs (decreased from 138 over the last few months).  LFTs WNL.  Plan: 1.  Continue niacin CR 500 mg PO QHS.   2.  Continue the healthy diet and exercise regimen. 3.  Follow up with fasting lipid panel and liver function tests in 3-6 months.

## 2011-03-23 NOTE — Patient Instructions (Signed)
With regards to the chest tightness, instructed patient to send her pacemaker report to Dr. Odessa Fleming office today so they can look at it first thing tomorrow morning.  Patient is concerned with bleeding from her biopsy for basal cell carcinoma coming up on March 30, 2011 (since she had bled for 4 days from the last time they had biopsied her neck).  For the next week (prior to her [facility] biopsy), will try and target the lower end of her INR range of 2.5-3.5.  Monday, March 4th - take 1 and 1/2 tablets of Coumadin Tuesday, March 5th - take 1 tablet of Coumadin Wednesday, March 6th - take 1 and 1/2 tablets of Coumadin  Thursday - resume Coumadin dose as scheduled.

## 2011-03-28 ENCOUNTER — Ambulatory Visit (INDEPENDENT_AMBULATORY_CARE_PROVIDER_SITE_OTHER): Payer: No Typology Code available for payment source | Admitting: Professional

## 2011-03-28 ENCOUNTER — Telehealth: Payer: Self-pay | Admitting: Cardiovascular Disease

## 2011-03-28 DIAGNOSIS — F4322 Adjustment disorder with anxiety: Secondary | ICD-10-CM

## 2011-03-28 NOTE — Telephone Encounter (Signed)
Company aware form received has not been reviewed at this time ./cy

## 2011-03-28 NOTE — Telephone Encounter (Signed)
New Msg: [facility] calling wanting to make sure we received an enrollment form for pt to receive INR meter. Please return call to discuss further if this form was not received.

## 2011-04-04 ENCOUNTER — Ambulatory Visit (INDEPENDENT_AMBULATORY_CARE_PROVIDER_SITE_OTHER): Payer: No Typology Code available for payment source

## 2011-04-04 ENCOUNTER — Encounter: Payer: Self-pay | Admitting: Internal Medicine

## 2011-04-04 ENCOUNTER — Ambulatory Visit: Payer: No Typology Code available for payment source | Admitting: Professional

## 2011-04-04 ENCOUNTER — Telehealth: Payer: Self-pay | Admitting: *Deleted

## 2011-04-04 ENCOUNTER — Ambulatory Visit (INDEPENDENT_AMBULATORY_CARE_PROVIDER_SITE_OTHER): Payer: No Typology Code available for payment source | Admitting: Internal Medicine

## 2011-04-04 VITALS — BP 98/66 | HR 64 | Ht 70.5 in | Wt 124.8 lb

## 2011-04-04 DIAGNOSIS — I4891 Unspecified atrial fibrillation: Secondary | ICD-10-CM

## 2011-04-04 DIAGNOSIS — I495 Sick sinus syndrome: Secondary | ICD-10-CM

## 2011-04-04 DIAGNOSIS — I059 Rheumatic mitral valve disease, unspecified: Secondary | ICD-10-CM

## 2011-04-04 DIAGNOSIS — G459 Transient cerebral ischemic attack, unspecified: Secondary | ICD-10-CM

## 2011-04-04 DIAGNOSIS — Z950 Presence of cardiac pacemaker: Secondary | ICD-10-CM

## 2011-04-04 LAB — PACEMAKER DEVICE OBSERVATION
AL THRESHOLD: 0.5 V
BAMS-0001: 175 {beats}/min
RV LEAD AMPLITUDE: 8 mv

## 2011-04-04 LAB — POCT INR: INR: 3.6

## 2011-04-04 NOTE — Telephone Encounter (Signed)
SPOKE WITH MICHAEL WITH  [employer]  ON HOME INR MONITORING  WILL  NOTIFY BY FAX  ONCE PT IS ENROLLED  MICHAEL'S PHONE NUMBER IS [phone] EXT [phone] FOR FUTURE REFERENCE./CY

## 2011-04-04 NOTE — Progress Notes (Signed)
HPI  Amy Lane is a 61 y.o. female is seen in followup for sinus bradycardia status post pacemaker, status post generator replacement and lead repair March 2011.  She also has a history of Ehlers-Danlos syndrome in the context of a marfanoid habitus, mitral valve prolapse status post mitral valve repair at the [facility] clinic with prior TIA on chronic Coumadin as well as syncope due to diagnosis of long QT syndrome. SHe has had significant dysautonomic symptoms consistent with POTS  The patient denies SOB, chest pain, edema or palpitations,   She has had some vague chest discomforts that are reminiscent of when she was taking a sublingual twice a day years and years ago. They typically occur at rest postprandially and last less than a minute     Past Medical History  Diagnosis Date  . Herpes zoster   . SVT (supraventricular tachycardia)   . PSVT (paroxysmal supraventricular tachycardia)   . PAT (paroxysmal atrial tachycardia)   . History of tachycardia-bradycardia syndrome   . Atrial fibrillation   . Dysautonomia   . History of syncope   . Cardiac pacemaker     DDD  MDT  . Encounter for long-term (current) use of anticoagulants   . Allergic rhinitis   . Mitral valve prolapse   . Cerebrovascular disease   . Hypercholesteremia   . Hypothyroidism   . Hemorrhoids   . Ehler's-Danlos syndrome   . Fibromyalgia   . History of transient ischemic attack   . Depression   . Carcinomas, basal cell     Past Surgical History  Procedure Date  . Complex mitral valve repair     at the [facility]  . Left carotid endarterectomy 11/2003    by Dr. Amada Kingfisher  . Pacemaker insertion     medtronic kappa [phone]    Current Outpatient Prescriptions  Medication Sig Dispense Refill  . acebutolol (SECTRAL) 200 MG capsule Take 1 capsule (200 mg total) by mouth daily. Take one tablet daily  90 capsule  3  . aspirin 81 MG tablet Take 81 mg by mouth daily.        . clindamycin (CLEOCIN) 2  % vaginal cream Use as directed      . levothyroxine (SYNTHROID, LEVOTHROID) 75 MCG tablet Take 1 tablet (75 mcg total) by mouth daily.  90 tablet  3  . LORazepam (ATIVAN) 1 MG tablet Take 1 tablet (1 mg total) by mouth at bedtime as needed.  30 tablet  5  . mometasone (NASONEX) 50 MCG/ACT nasal spray Place 2 sprays into the nose 2 (two) times daily.  17 g  11  . niacin (NIASPAN) 500 MG CR tablet Take 1 tablet (500 mg total) by mouth at bedtime.  30 tablet  3  . warfarin (COUMADIN) 5 MG tablet Take as directed by the coumadin clinic.  145 tablet  1    Allergies  Allergen Reactions  . Other     Allergic to all QT drugs---  . Statins     myalgias    Review of Systems negative except from HPI and PMH  Physical Exam BP 98/66  Pulse 64  Ht 5' 10.5" (1.791 m)  Wt 124 lb 12.8 oz (56.609 kg)  BMI 17.65 kg/m2 Well developed and well nourished in no acute distress HENT normal E scleral and icterus clear Neck Supple JVP flat; carotids brisk and full Pacemaker pocket Clear to ausculation Regular rate and rhythm, no murmurs gallops or rub Soft with active bowel sounds  No clubbing cyanosis none Edema Alert and oriented, grossly normal motor and sensory function Skin Warm and Dry   Assessment and  Plan

## 2011-04-04 NOTE — Assessment & Plan Note (Signed)
The patient's device was interrogated.  The information was reviewed. No changes were made in the programming.    

## 2011-04-04 NOTE — Patient Instructions (Signed)
Remote monitoring is used to monitor your Pacemaker of ICD from home. This monitoring reduces the number of office visits required to check your device to one time per year. It allows [place] to keep an eye on the functioning of your device to ensure it is working properly. You are scheduled for a device check from home on 07/06/11. You may send your transmission at any time that day. If you have a wireless device, the transmission will be sent automatically. After your physician reviews your transmission, you will receive a postcard with your next transmission date.  Your physician wants you to follow-up in: 1 year with Dr. Graciela Husbands. You will receive a reminder letter in the mail two months in advance. If you don't receive a letter, please call our office to schedule the follow-up appointment.  Your physician recommends that you continue on your current medications as directed. Please refer to the Current Medication list given to you today.

## 2011-04-04 NOTE — Assessment & Plan Note (Signed)
No recurrent afib 

## 2011-04-11 ENCOUNTER — Ambulatory Visit: Payer: No Typology Code available for payment source | Admitting: Professional

## 2011-04-18 ENCOUNTER — Ambulatory Visit (INDEPENDENT_AMBULATORY_CARE_PROVIDER_SITE_OTHER): Payer: No Typology Code available for payment source | Admitting: Professional

## 2011-04-18 DIAGNOSIS — F331 Major depressive disorder, recurrent, moderate: Secondary | ICD-10-CM

## 2011-04-25 ENCOUNTER — Ambulatory Visit (INDEPENDENT_AMBULATORY_CARE_PROVIDER_SITE_OTHER): Payer: No Typology Code available for payment source | Admitting: Professional

## 2011-04-25 DIAGNOSIS — F331 Major depressive disorder, recurrent, moderate: Secondary | ICD-10-CM

## 2011-04-26 ENCOUNTER — Ambulatory Visit (INDEPENDENT_AMBULATORY_CARE_PROVIDER_SITE_OTHER): Payer: No Typology Code available for payment source | Admitting: Pharmacist

## 2011-04-26 DIAGNOSIS — I059 Rheumatic mitral valve disease, unspecified: Secondary | ICD-10-CM

## 2011-04-26 DIAGNOSIS — G459 Transient cerebral ischemic attack, unspecified: Secondary | ICD-10-CM

## 2011-04-26 DIAGNOSIS — I4891 Unspecified atrial fibrillation: Secondary | ICD-10-CM

## 2011-05-02 ENCOUNTER — Ambulatory Visit: Payer: No Typology Code available for payment source | Admitting: Professional

## 2011-05-03 ENCOUNTER — Encounter: Payer: Self-pay | Admitting: Cardiovascular Disease

## 2011-05-09 ENCOUNTER — Ambulatory Visit (INDEPENDENT_AMBULATORY_CARE_PROVIDER_SITE_OTHER): Payer: No Typology Code available for payment source | Admitting: Professional

## 2011-05-09 DIAGNOSIS — F331 Major depressive disorder, recurrent, moderate: Secondary | ICD-10-CM

## 2011-05-17 ENCOUNTER — Telehealth: Payer: Self-pay | Admitting: *Deleted

## 2011-05-17 ENCOUNTER — Encounter: Payer: Self-pay | Admitting: Cardiovascular Disease

## 2011-05-17 NOTE — Telephone Encounter (Signed)
PER PT CURRENTLY TAKING  COUMADIN 7.5 MG EVERY DAY EXCEPT  MON  TAKE 10 MG  WILL DISCUSS FINDINGS WITH DR [name] INR FROM  05-03-11  WAS 3.8 LAST WEEK  INR WAS  3.1 AND TODAY INR  3.9

## 2011-05-17 NOTE — Telephone Encounter (Signed)
PT AWARE  OF COUMADIN CHANGE PER PT CHECKS EVERY WED .Zack Seal

## 2011-05-17 NOTE — Telephone Encounter (Signed)
Take 7.5 every day except  Monday and Thursday take 5 mg.  F/U in coumadin 2-3 weeks after change

## 2011-05-17 NOTE — Telephone Encounter (Signed)
LEFT MESSAGE  TO FIND OUT   DOSING OF COUMADIN .Amy Lane

## 2011-05-22 ENCOUNTER — Ambulatory Visit (INDEPENDENT_AMBULATORY_CARE_PROVIDER_SITE_OTHER): Payer: No Typology Code available for payment source | Admitting: Pulmonary Disease

## 2011-05-22 ENCOUNTER — Encounter: Payer: Self-pay | Admitting: Pulmonary Disease

## 2011-05-22 VITALS — BP 116/68 | HR 63 | Temp 97.0°F | Ht 70.5 in | Wt 124.0 lb

## 2011-05-22 DIAGNOSIS — I679 Cerebrovascular disease, unspecified: Secondary | ICD-10-CM

## 2011-05-22 DIAGNOSIS — Q078 Other specified congenital malformations of nervous system: Secondary | ICD-10-CM

## 2011-05-22 DIAGNOSIS — I059 Rheumatic mitral valve disease, unspecified: Secondary | ICD-10-CM

## 2011-05-22 DIAGNOSIS — F3289 Other specified depressive episodes: Secondary | ICD-10-CM

## 2011-05-22 DIAGNOSIS — Q796 Ehlers-Danlos syndrome, unspecified: Secondary | ICD-10-CM

## 2011-05-22 DIAGNOSIS — E039 Hypothyroidism, unspecified: Secondary | ICD-10-CM

## 2011-05-22 DIAGNOSIS — G459 Transient cerebral ischemic attack, unspecified: Secondary | ICD-10-CM

## 2011-05-22 DIAGNOSIS — Z950 Presence of cardiac pacemaker: Secondary | ICD-10-CM

## 2011-05-22 DIAGNOSIS — I495 Sick sinus syndrome: Secondary | ICD-10-CM

## 2011-05-22 DIAGNOSIS — F329 Major depressive disorder, single episode, unspecified: Secondary | ICD-10-CM

## 2011-05-22 DIAGNOSIS — E7800 Pure hypercholesterolemia, unspecified: Secondary | ICD-10-CM

## 2011-05-22 MED ORDER — LORAZEPAM 1 MG PO TABS: 1.0000 mg | ORAL_TABLET | Freq: Every evening | ORAL | Status: DC | PRN

## 2011-05-22 MED ORDER — LEVOTHYROXINE SODIUM 75 MCG PO TABS: 75.0000 ug | ORAL_TABLET | Freq: Every day | ORAL | Status: DC

## 2011-05-22 NOTE — Patient Instructions (Signed)
Today we updated your med list in our EPIC system...    Continue your current medications the same...    We refilled the meds you requested...  Call for any questions...  Let me know if we can be of service in any way...  Let's plan a follow up visit in 6 months.Marland KitchenMarland Kitchen

## 2011-05-22 NOTE — Progress Notes (Signed)
Subjective:    Patient ID: Amy Lane, female    DOB: [birth year], 61 y.o.   MRN: [id number]  HPI 61 y/o WF here for a follow up visit... she has mult med problems as noted below... she is followed regularly by Drs Azucena Fallen for Cardiology, and Drs Purcell Nails for GI...  ~  October 13, 2009:  she was seen by Dr. Everlene Other at the Vulvar Clinic at [facility] for her vulvar dermatitis & dryness & rec to use Crisco shortening Tid... she reports improved over the last 565mo- feeling better, in good spirits w/ the Lexapro helping, etc... she had f/u appts w/ DrKlein 6/11 & Walker Kehr 7/11> both felt she was doing well- noted normal atrial pacing w/ long latency on EKG & they are following for now... due for f/u fasting labs & Flu shot today... mild dermatitis on legs- LidexE written...  ~  May 23, 2010:  65mo ROV & she reports feeling "the best I have in yrs" attributed to stopping her Lipitor "too many neuro issues and not enough benefit" per DrWillis & [name];  She is looking forward to [place] vacation w/ family this summer; she was able to stop the Lexapro last January & doing fine without it now...  She denies CP, palpit, dizziness, syncope, edema, or cerebral ischemic symptoms... Due for f/u fasting labs> TChol 218, LDL 135, and she will continue to control this w/ low chol/ low fat diet...  ~  November 21, 2010:  565mo ROV & she reports that while in [place] this past summer she developed a "blood clot" meaning DVT despite being on her Coumadin & INR was 2.7 prior to the trip (when rechecked in [place] it was 4.5)> left leg swelling, pain, doppler performed by physician in [place] was pos, they questioned poss PTE due to incr dyspnea, treated w/ Lovenox along the way & f/u protimes on her Coumadin, but didn't stop her travels;  When back home checked w/ Walker Kehr- he thought bleed into muscle from prolonged INR of 4.5; she went to see DrNorris but he couldn't corroborate & treated her w/  compression hose;  Now back to baseline- swelling down, feeling better, back into exercise program & breathing better, followed in the Coumadin Clinic...    Also c/o URI, allergy symptoms, pain in ear & jaw> persistent discomfort treated w/ antihist, Nasonex, Pred, nasal saline, Celebrex, etc; DrByers planned tubes in her ears but she finally improved ~75% she says so she cancelled the tubes; they were going to send her to orthodontist vs oral surg for the TMJ & she cancelled that too...     We are going to check Fasting labs & give her the 2012 Flu vaccine today...  ~  May 22, 2011:  565mo ROV & Amy Lane is in great spirits "everything is fine" and she is now doing home coumadin monitoring w/ INR goal 2.5-3.5 range "I love it" she says...     She saw Walker Kehr in f/u 1/13 for her Ehlers-Danlos, marfanoid habitus, MVP w/ MV repair at [facility], hx TIA on chr coumadin, syncope due to long QT syndrome, & postural tachycardia syndrome (POTS); she had a left CAE 11/05 & a normal f/u duplex 11/11; doing satis & no changes made...    She saw DrKlein 3/13 for f/u SAnode dysfunction (bradycardia), PAFib, Pacer> she had last generator replacement & a lead repair 3/11; she is stable w/o recurrent AFib & pacer working well...    She has been  followed in the Lipid Clinic & currently taking Niacin500mg /d; last FLP 2/13 showed TChol 199, TG 55, HDL 76, LDL 112; much improved & they decided to continue same meds as is; her weight is a bit low however at 124#, 70" tall, and BMI~18; we discussed supplements to help her gain a few lbs...    She has followed up w/ GYN & diagnosed w/ desquamative inflammatory vaginitis (DIV) & inserting Vagifem tabs MWF; I wondered about an assoc w/ her prev dx of kraurosis vulvae & was seen at the [facility] vulva clinic by Dr. Everlene Other; she is interested in a second opinion regarding her current problem 7 will call for f/u appt at [facility]...    She also reports a skin lesion removed from the back of  her neck by DrLupton & then wider excision was carried out; DermPath in [facility] indicates it was a basal cell cancer & she is doing fine at present...   Problem List:    << PROBLEM LIST UPDATED 05/22/11 >>   ALLERGIC RHINITIS (ICD-477.9) - we discussed Rx w/ Zyrtek, Astepro Prn, etc... ~  CXR 3/11 showed left pacer, sternal wires, NAD...  MITRAL VALVE PROLAPSE (ICD-424.0) - followed by Walker Kehr... ~  2DEcho 11/08 showed myxomatous MV, annuloplasty ring, decr post leaflet excursion, norm LVH & wall motion... ~  NuclearStressTest 11/08 showed mild apical thinning... prev study 12/04 w/o ischemia or infarct & EF=68%... ~  she sees [place] every 6 months- for f/u MVP (s/p MV repair), dysautonomia (s/p pacer), & Ehlers-Danlos Syndrome> notes reviewed...  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 hx AFib/Flutter w/ ablation & pacemaker placed ... she has Ehlers-Danlos synd w/ prolonged QT interval...   CARDIAC PACEMAKER IN SITU (ICD-V45.01) - f/u DrKlein w/ CTChest 4/09- neg. ~  dual chamber pacer changed 3/11 by DrKlein for end-of-life...  DYSAUTONOMIA (ICD-742.8) - prev on Proamatine 5mg  tabs per DrKlein, but this was stopped...  CEREBROVASCULAR DISEASE (ICD-437.9) - on ASA 81mg /d & COUMADIN (followed in the clinic)... ~  she had a Left CAE 11/05 by [facility] for symptomatic left carotid stenosis w/ ulceration... ~  Carotid angiogram 5/08 by [facility] showed <30% right carotid stenosis ~  f/u CTA of Head & Neck 12/09 was neg= norm CTA head, & no signif carotid dis noted in neck. ~  CDoppler 2/11 showed stable mild carotid dis, left CAE w/ DPA is patent, 0-39% bilat ICA stenoses. ~  repear CDopplers 11/11 per Walker Kehr showed smooth plaque in right bulb & distal left CCA- stable; 0-39% bilat ICA stenoses...  ?DVT & poss PTE while on vacation in [place] during the summer of 2012 >> pt was on Coumadin & INR was therapeutic; she had pain & swelling in  left leg w/ a pos doppler reported by a [place] physician; she was also SOB & they questioned poss PTE but didn't get scan or further eval;  On return to [place] DrNishan wondered if she might have bled into the leg muscles & sent her to DrNorris for Ortho eval; he could not corroborate the theory & treated her w/ compression stockings & she gradually improved towards her baseline...  HYPERCHOLESTEROLEMIA (ICD-272.0) - on diet alone now, prev on Lip20- this was stopped 1/12 by Walker Kehr & she feels much better off this med! ~  FLP 05/13/07 on Lip20 showed TChol 119, TG 50, HDL 55, LDL 54 ~  FLP 5/10 on Lip20 showed TChol 120, TG 43, HDL 58, LDL 54 ~  FLP  9/11 on Lip20 showed TChol 130, TG 54, HDL 59, LDL 60 ~  FLP 5/12 on diet alone showed TChol 218, TG 91, HDL 57, LDL 135... Continue low chol, low fat diet. ~  FLP 10/12 showed TChol 231, TG 86, HDL 68, LDL 151... I rec the Lipid CLinic, & they started NIACIN 500mg /d. ~  FLP 2/13 on Niacin500 showed TChol 199, TG 55, HDL 76, LDL 112... Much improved, continue same.  HYPOTHYROIDISM (ICD-244.9) - currently on SYNTHROID 49mcg/d... ~  labs 4/09 on Levoth88 showed TSH = 0.10... rec to decr Synthroid to 25mcg/d... ~  labs 9/09 on Levoth75 showed TSH = 1.67 ~  labs 5/10 on Levoth75 showed TSH = 2.92... Continue same. ~  labs 9/11 on Levoth75 showed TSH= 1.37 ~  Labs 5/12 on Levothy75 showed TSH= 4.33 ~  Labs 10/12 on Levothy75 showed TSH= 1.51... Continue same.  HEMORRHOIDS (ICD-455.6) - last colonoscopy 2/09 by DrJacobs showed only sm hems... f/u 3yrs.  GYN - followed by DrMcPhail and Dx w/ kraurosis vulvae 7/10... Prev on Premarin VagCream. ~  11/10: she discussed the Dx w/ me & will inquire about poss hyst/ BSO... ~  4/11:  eval in the [facility] by Dr Everlene Other showed vulvar dermatitis & dryness w/ rec for "Crisco" Tid! ~  4/13:  She reports that local GYN diagnosed desquamative inflammatory vaginitis (DIV) & she is currently using  VagifemMWF; she indicates that she will f/u w/ [facility].   FIBROMYALGIA (ICD-729.1) - she has been doing Yoga classes and this has helped...  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  Hx of TRANSIENT ISCHEMIC ATTACK (ICD-435.9) - on ASA 81mg /d & COUMADIN> she has been followed by DrWillis; s/p left carotid endarterectomy 11/05 by [facility]. ~  1/12: f/u eval by DrWillis reviewed...  Hx of SYNCOPE (ICD-780.2) - felt to be neurally mediated, ? related to long QT, no recur since pacer placed...  DEPRESSION (ICD-311) - she takes LORAZEPAM 1mg Qhs for insomnia... she prev saw DrGraves for counselling in [place]... ~  11/10: poor appetite & some wt loss prob related to depression & we discussed trial LEXAPRO 10mg /d. ~  3/11: she is very pleased- feels better, gained 5# w/ Ensure, etc... ~  9/11:  "I feel great" & Lexapro continued... ~  She was able to stop the Lexapro 1/12 (also stopped the Lipitor) & has done well off it ever since...  CARCINOMA, BASAL CELL (ICD-173.9) - skin cancer removed from left leg by [facility] 2008. ~  12/11:  She reports bx of rash on legs= lichen planus per DrHall, treated w/ topical ointment... ~  2/13:  She had a skin lesion removed from her neck= basal cell ca & wide excision performed by DrLupton.  Health Maintenance: ~  GI:  followed by DrJacobs w/ colonoscopy 2/09 showing only sm hems... ~  GYN:  followed by DrMcPhail, and Dr. Everlene Other at [facility] vulvar clinic... ~  Labs 9/11 showed Vit D level = 39 & rec to start 1000 u OTC supplement... ~  Immunizations:  she had TDAP 3/11... she gets yearly Flu vaccine in the Fall of the yr...    Past Surgical History  Procedure Date  . Complex mitral valve repair     at [facility]  . Left carotid endarterectomy 11/2003    by Dr. Amada Kingfisher  . Pacemaker insertion     medtronic kappa [phone]    Outpatient Encounter Prescriptions as of 05/22/2011  Medication Sig Dispense Refill  .  acebutolol (SECTRAL) 200 MG capsule Take 1 capsule (200 mg total) by mouth daily. Take one tablet daily  90 capsule  3  . aspirin 81 MG tablet Take 81 mg by mouth daily.        Marland Kitchen levothyroxine (SYNTHROID, LEVOTHROID) 75 MCG tablet Take 1 tablet (75 mcg total) by mouth daily.  90 tablet  3  . LORazepam (ATIVAN) 1 MG tablet Take 1 tablet (1 mg total) by mouth at bedtime as needed.  30 tablet  5  . mometasone (NASONEX) 50 MCG/ACT nasal spray Place 2 sprays into the nose 2 (two) times daily.  17 g  11  . niacin (NIASPAN) 500 MG CR tablet Take 1 tablet (500 mg total) by mouth at bedtime.  30 tablet  3  . warfarin (COUMADIN) 5 MG tablet Take as directed by the coumadin clinic.  145 tablet  1    ALLERGIRES:  All QT drugs, and Adhesive Tape...   Current Medications, Allergies, Past Medical History, Past Surgical History, Family History, and Social History were reviewed in [employer] record.    Review of Systems         See HPI - all other systems neg except as noted... The patient complains of dyspnea on exertion.  The patient denies anorexia, fever, weight loss, weight gain, vision loss, decreased hearing, hoarseness, chest pain, syncope, peripheral edema, prolonged cough, headaches, hemoptysis, abdominal pain, melena, hematochezia, severe indigestion/heartburn, hematuria, incontinence, muscle weakness, suspicious skin lesions, transient blindness, difficulty walking, depression, unusual weight change, abnormal bleeding, enlarged lymph nodes, and angioedema.     Objective:   Physical Exam     WD, Thin, 61 y/o WF in NAD... she is 5'11"Tall and 141# = BMI~20... Vital Signs:  Reviewed... GENERAL:  Alert & oriented; pleasant & cooperative... HEENT:  [place]/AT, EOM-wnl, PERRLA, EACs-clear, TMs-wnl, NOSE-clear, THROAT-clear & wnl. NECK:  Supple w/ fair ROM; no JVD; normal carotid impulses w/o bruits, left CAE scar; no thyromegaly or nodules palpated; no lymphadenopathy. CHEST:   Clear to P & A; without wheezes/ rales/ or rhonchi heard... HEART:  Regular Rhythm; pacer on left, without murmurs/ rubs/ or gallops detected... ABDOMEN:  Soft & nontender; normal bowel sounds; no organomegaly or masses palpated... EXT: without deformities or arthritic changes; no varicose veins/ venous insuffic/ or edema. NEURO:  CN's intact;  no focal neuro deficits... DERM:  s/p skin cancer removed from left leg; min rash noted...  RADIOLOGY DATA:  Reviewed in the EP[facility] EMR & discussed w/ the patient...  LABORATORY DATA:  Reviewed in the EPIC EMR & discussed w/ the patient...   Assessment & Plan:   URI/ Allergy> she estimates allergy symptoms are 75% better; rec to use Allegra, Nasal Saline, Nasonex...  MVP/ MV Repair> Tachy-Brady/ PACER> Dysautonomia>  Followed by Walker Kehr & DrKlein, doing well on Coumadin (now home monitoring) & Sectral, continue same...  Cerebrovasc Dis>  Also takes ASA 81mg /d, s/p left CAE 11/05, no cerebral ischemic symptoms...  ?DVT while in [place]> moot issue at present; stable on her Coumadin via CC; leg swelling diminished on the compression hose, no salt, elevation, etc; she is back to her exercise program & back to baseline...  CHOL>  She feels much better off the Lipitor, & control is improved w/ Niacin 500mg /d via Lipid clinic...  Hypothyroid>  Stable on the Synthroid 23mcg/d...  GYN>  Stable & followed both here & at [facility] (see above)...  EHLERS-DANLOS>  Aware, stable, she has been feeling better & in great spirits.Marland KitchenMarland Kitchen  Other medical problems as noted...   Patient's Medications  New Prescriptions   No medications on file  Previous Medications   ACEBUTOLOL (SECTRAL) 200 MG CAPSULE    Take 1 capsule (200 mg total) by mouth daily. Take one tablet daily   ASPIRIN 81 MG TABLET    Take 81 mg by mouth daily.     MOMETASONE (NASONEX) 50 MCG/ACT NASAL SPRAY    Place 2 sprays into the nose 2 (two) times daily.   NIACIN (NIASPAN) 500 MG CR TABLET    Take 1  tablet (500 mg total) by mouth at bedtime.   WARFARIN (COUMADIN) 5 MG TABLET    Take as directed by the coumadin clinic.  Modified Medications   Modified Medication Previous Medication   LEVOTHYROXINE (SYNTHROID, LEVOTHROID) 75 MCG TABLET levothyroxine (SYNTHROID, LEVOTHROID) 75 MCG tablet      Take 1 tablet (75 mcg total) by mouth daily.    Take 1 tablet (75 mcg total) by mouth daily.   LORAZEPAM (ATIVAN) 1 MG TABLET LORazepam (ATIVAN) 1 MG tablet      Take 1 tablet (1 mg total) by mouth at bedtime as needed.    Take 1 tablet (1 mg total) by mouth at bedtime as needed.  Discontinued Medications   No medications on file

## 2011-05-23 ENCOUNTER — Ambulatory Visit (INDEPENDENT_AMBULATORY_CARE_PROVIDER_SITE_OTHER): Payer: No Typology Code available for payment source | Admitting: Professional

## 2011-05-23 DIAGNOSIS — F331 Major depressive disorder, recurrent, moderate: Secondary | ICD-10-CM

## 2011-05-24 ENCOUNTER — Encounter: Payer: Self-pay | Admitting: Cardiovascular Disease

## 2011-05-24 LAB — PROTIME-INR

## 2011-05-25 ENCOUNTER — Other Ambulatory Visit: Payer: Self-pay | Admitting: Cardiovascular Disease

## 2011-05-30 ENCOUNTER — Ambulatory Visit: Payer: No Typology Code available for payment source | Admitting: Professional

## 2011-05-30 ENCOUNTER — Telehealth: Payer: Self-pay | Admitting: Cardiovascular Disease

## 2011-05-30 NOTE — Telephone Encounter (Signed)
Patient called no answer.LMTC. 

## 2011-05-30 NOTE — Telephone Encounter (Signed)
Fu call °Pt returning your call  °

## 2011-05-31 ENCOUNTER — Encounter: Payer: Self-pay | Admitting: Cardiovascular Disease

## 2011-05-31 LAB — PROTIME-INR

## 2011-05-31 NOTE — Telephone Encounter (Signed)
SEE LAB RESULTS  SPOKE WITH PT .Amy Lane

## 2011-06-07 ENCOUNTER — Encounter: Payer: Self-pay | Admitting: Cardiovascular Disease

## 2011-06-07 LAB — PROTIME-INR

## 2011-06-14 ENCOUNTER — Encounter: Payer: Self-pay | Admitting: Cardiovascular Disease

## 2011-06-21 ENCOUNTER — Encounter: Payer: Self-pay | Admitting: Cardiovascular Disease

## 2011-06-28 ENCOUNTER — Encounter: Payer: Self-pay | Admitting: Cardiovascular Disease

## 2011-06-28 ENCOUNTER — Other Ambulatory Visit: Payer: Self-pay | Admitting: Cardiovascular Disease

## 2011-06-29 ENCOUNTER — Other Ambulatory Visit: Payer: Self-pay

## 2011-06-29 MED ORDER — NIACIN ER (ANTIHYPERLIPIDEMIC) 500 MG PO TBCR: 500.0000 mg | EXTENDED_RELEASE_TABLET | Freq: Every day | ORAL | Status: DC

## 2011-07-06 ENCOUNTER — Ambulatory Visit (INDEPENDENT_AMBULATORY_CARE_PROVIDER_SITE_OTHER): Payer: No Typology Code available for payment source | Admitting: *Deleted

## 2011-07-06 DIAGNOSIS — I4891 Unspecified atrial fibrillation: Secondary | ICD-10-CM

## 2011-07-06 DIAGNOSIS — Z950 Presence of cardiac pacemaker: Secondary | ICD-10-CM

## 2011-07-12 ENCOUNTER — Encounter: Payer: Self-pay | Admitting: Cardiovascular Disease

## 2011-07-13 ENCOUNTER — Telehealth: Payer: Self-pay | Admitting: Cardiovascular Disease

## 2011-07-13 NOTE — Telephone Encounter (Signed)
PER PT IS POSITIVE THAT SHE TOOK DOUBLE DOSE  OF COUMADIN ON MON THEN THE NEXT DAY TOOK 7.5 MG  LAST NIGHT ONLY TOOK 5 MG  WILL RETURN TO PREVIOUS DOSAGE  TONIGHT  ALSO HAD SALAD FEELS OKAY  NEXT TIME PER PT WHEN IN DOUBT WILL NOT TAKE .DR Bartolo Darter OF ABOVE .Zack Seal

## 2011-07-13 NOTE — Telephone Encounter (Signed)
New msg Pt called to say her inr was 3.5 yesterday and she took double dose on Monday

## 2011-07-14 LAB — REMOTE PACEMAKER DEVICE
AL THRESHOLD: 0.625 V
ATRIAL PACING PM: 77
RV LEAD THRESHOLD: 0.875 V
VENTRICULAR PACING PM: 0

## 2011-07-17 ENCOUNTER — Ambulatory Visit: Payer: Self-pay | Admitting: Cardiovascular Disease

## 2011-07-17 DIAGNOSIS — G459 Transient cerebral ischemic attack, unspecified: Secondary | ICD-10-CM

## 2011-07-17 DIAGNOSIS — I059 Rheumatic mitral valve disease, unspecified: Secondary | ICD-10-CM

## 2011-07-17 DIAGNOSIS — I4891 Unspecified atrial fibrillation: Secondary | ICD-10-CM

## 2011-07-18 ENCOUNTER — Encounter: Payer: Self-pay | Admitting: *Deleted

## 2011-07-19 ENCOUNTER — Emergency Department (HOSPITAL_COMMUNITY): Payer: No Typology Code available for payment source

## 2011-07-19 ENCOUNTER — Encounter (HOSPITAL_COMMUNITY): Payer: Self-pay

## 2011-07-19 ENCOUNTER — Emergency Department (HOSPITAL_COMMUNITY)
Admission: EM | Admit: 2011-07-19 | Discharge: 2011-07-19 | Disposition: A | Discharge status: Home / Self Care | Payer: No Typology Code available for payment source | Attending: Emergency Medicine | Admitting: Emergency Medicine

## 2011-07-19 ENCOUNTER — Encounter: Payer: Self-pay | Admitting: Cardiovascular Disease

## 2011-07-19 DIAGNOSIS — Z950 Presence of cardiac pacemaker: Secondary | ICD-10-CM | POA: Insufficient documentation

## 2011-07-19 DIAGNOSIS — R5383 Other fatigue: Secondary | ICD-10-CM | POA: Insufficient documentation

## 2011-07-19 DIAGNOSIS — Q796 Ehlers-Danlos syndrome, unspecified: Secondary | ICD-10-CM | POA: Insufficient documentation

## 2011-07-19 DIAGNOSIS — G459 Transient cerebral ischemic attack, unspecified: Secondary | ICD-10-CM | POA: Insufficient documentation

## 2011-07-19 DIAGNOSIS — R4182 Altered mental status, unspecified: Secondary | ICD-10-CM | POA: Insufficient documentation

## 2011-07-19 DIAGNOSIS — R5381 Other malaise: Secondary | ICD-10-CM | POA: Insufficient documentation

## 2011-07-19 DIAGNOSIS — Z7901 Long term (current) use of anticoagulants: Secondary | ICD-10-CM | POA: Insufficient documentation

## 2011-07-19 DIAGNOSIS — H539 Unspecified visual disturbance: Secondary | ICD-10-CM | POA: Insufficient documentation

## 2011-07-19 DIAGNOSIS — I4891 Unspecified atrial fibrillation: Secondary | ICD-10-CM | POA: Insufficient documentation

## 2011-07-19 LAB — COMPREHENSIVE METABOLIC PANEL
ALT: 11 U/L (ref 0–35)
Alkaline Phosphatase: 53 U/L (ref 39–117)
CO2: 26 mEq/L (ref 19–32)
Chloride: 100 mEq/L (ref 96–112)
GFR calc Af Amer: >90 mL/min (ref >90)
GFR calc non Af Amer: 80 mL/min — ABNORMAL LOW (ref >90)
Glucose, Bld: 123 mg/dL — ABNORMAL HIGH (ref 70–99)
Potassium: 3.6 mEq/L (ref 3.5–5.1)
Sodium: 136 mEq/L (ref 135–145)
Total Bilirubin: 0.3 mg/dL (ref 0.3–1.2)

## 2011-07-19 LAB — URINE MICROSCOPIC-ADD ON

## 2011-07-19 LAB — URINALYSIS, ROUTINE W REFLEX MICROSCOPIC
Bilirubin Urine: NEGATIVE
Glucose, UA: NEGATIVE mg/dL
Ketones, ur: NEGATIVE mg/dL
pH: 7.5 (ref 5.0–8.0)

## 2011-07-19 LAB — CBC
HCT: 35.1 % — ABNORMAL LOW (ref 36.0–46.0)
MCH: 30.2 pg (ref 26.0–34.0)
MCV: 88.4 fL (ref 78.0–100.0)
RDW: 13.1 % (ref 11.5–15.5)
WBC: 4.7 10*3/uL (ref 4.0–10.5)

## 2011-07-19 LAB — DIFFERENTIAL
Eosinophils Absolute: 0.1 10*3/uL (ref 0.0–0.7)
Eosinophils Relative: 2 % (ref 0–5)
Lymphocytes Relative: 27 % (ref 12–46)
Lymphs Abs: 1.3 10*3/uL (ref 0.7–4.0)
Monocytes Absolute: 0.5 10*3/uL (ref 0.1–1.0)

## 2011-07-19 MED ORDER — SODIUM CHLORIDE 0.9 % IV SOLN
INTRAVENOUS | Status: DC
  Administered 2011-07-19: 02:00:00 via INTRAVENOUS

## 2011-07-19 NOTE — ED Notes (Signed)
Pt sittin up watching TV in bed when started seeing spots, started having extreme weakness and couldn't get OOB, and unable to speak without slurring this lasted for appox 15 minutes. EMS called and arrived and found the patient asymptomatic sitting on bedside approx 45 minutes after.

## 2011-07-19 NOTE — ED Notes (Signed)
Pt discharged home in good condition with spouse. 

## 2011-07-19 NOTE — ED Provider Notes (Signed)
History     CSN: [id number]  Arrival date & time 07/19/11  4098   First MD Initiated Contact with Patient 07/19/11 [phone]      Chief Complaint  Patient presents with  . Transient Ischemic Attack    (Consider location/radiation/quality/duration/timing/severity/associated sxs/prior treatment) The history is provided by the patient.   patient was sitting in bed and started seeing spots. She's to have this oral before getting migraines. She then had some generalized weakness confusion and was unable to speak. She states she had trouble forming words her head. She states it trouble remembering her husband's name. Per the patient's husband he asked her to raise her arms and she was able to, she appeared confused about it. No headache. She's had previous TIAs and strokes. They have previously involved difficulty speaking and right-sided weakness. There is no reported lateralizing weakness at this time. Patient states she feels better now. She states it lasted around 40 minutes. She has a history of Ehler's-Danlos syndrome. She is also on Coumadin already.  Past Medical History  Diagnosis Date  . Herpes zoster   . SVT (supraventricular tachycardia)   . PSVT (paroxysmal supraventricular tachycardia)   . PAT (paroxysmal atrial tachycardia)   . History of tachycardia-bradycardia syndrome   . Atrial fibrillation   . Dysautonomia   . History of syncope   . Cardiac pacemaker     DDD  MDT  . Encounter for long-term (current) use of anticoagulants   . Allergic rhinitis   . Mitral valve prolapse   . Cerebrovascular disease   . Hypercholesteremia   . Hypothyroidism   . Hemorrhoids   . Ehler's-Danlos syndrome   . Fibromyalgia   . History of transient ischemic attack   . Depression   . Carcinomas, basal cell   . Skin desquamation     inflammative vaginitis  . Abnormal gait   . Hypercholesterolemia     Past Surgical History  Procedure Date  . Complex mitral valve repair     at the  [facility]  . Left carotid endarterectomy 11/2003    by Dr. Amada Kingfisher  . Pacemaker insertion     medtronic kappa [id number]    Family History  Problem Relation Age of Onset  . Cancer      family history of  . Coronary artery disease      family history of    History  Substance Use Topics  . Smoking status: Former Smoker    Types: Cigarettes  . Smokeless tobacco: Never Used   Comment: smoked in her early 20's--smoked 4 cigs per year   . Alcohol Use: Yes     social use    OB History    Grav Para Term Preterm Abortions TAB SAB Ect Mult Living                  Review of Systems  Constitutional: Negative for activity change and appetite change.  HENT: Negative for neck stiffness.   Eyes: Positive for visual disturbance. Negative for pain.  Respiratory: Negative for chest tightness and shortness of breath.   Cardiovascular: Negative for chest pain and leg swelling.  Gastrointestinal: Negative for nausea, vomiting, abdominal pain and diarrhea.  Genitourinary: Negative for flank pain.  Musculoskeletal: Negative for back pain.  Skin: Negative for rash.  Neurological: Positive for speech difficulty and weakness. Negative for numbness and headaches.  Psychiatric/Behavioral: Negative for behavioral problems.    Allergies  Other and Statins  Home Medications   Current  Outpatient Rx  Name Route Sig Dispense Refill  . ACEBUTOLOL HCL 200 MG PO CAPS  TAKE 1 CAPSULE BY MOUTH ONCE DAILY 90 capsule 3  . ASPIRIN 81 MG PO TABS Oral Take 81 mg by mouth daily.      Marland Kitchen BRINZOLAMIDE 1 % OP SUSP Left Eye Place 1 drop into the left eye daily.    Marland Kitchen LEVOTHYROXINE SODIUM 75 MCG PO TABS Oral Take 1 tablet (75 mcg total) by mouth daily. 90 tablet 3  . LORAZEPAM 1 MG PO TABS Oral Take 1 tablet (1 mg total) by mouth at bedtime as needed. 30 tablet 5  . MOMETASONE FUROATE 50 MCG/ACT NA SUSP Nasal Place 2 sprays into the nose 2 (two) times daily as needed. For seasonal allergies    . NIACIN ER  (ANTIHYPERLIPIDEMIC) 500 MG PO TBCR Oral Take 1 tablet (500 mg total) by mouth at bedtime. 90 tablet 3  . WARFARIN SODIUM 5 MG PO TABS Oral Take 5-7.5 mg by mouth daily. Take 7.5mg  (1.5 tablet) daily except take 5mg  (1 tablet) on Monday and friday      BP 128/64  Pulse 62  Temp 97.8 F (36.6 C) (Oral)  Resp 18  SpO2 99%  Physical Exam  Nursing note and vitals reviewed. Constitutional: She is oriented to person, place, and time. She appears well-developed and well-nourished.  HENT:  Head: Normocephalic and atraumatic.  Eyes: EOM are normal. Pupils are equal, round, and reactive to light.  Neck: Normal range of motion. Neck supple.  Cardiovascular: Normal rate, regular rhythm and normal heart sounds.   No murmur heard. Pulmonary/Chest: Effort normal and breath sounds normal. No respiratory distress. She has no wheezes. She has no rales.  Abdominal: Soft. Bowel sounds are normal. She exhibits no distension. There is no tenderness. There is no rebound and no guarding.  Musculoskeletal: Normal range of motion.  Neurological: She is alert and oriented to person, place, and time. No cranial nerve deficit.       Grip strength intact bilaterally. Face is symmetric.  Skin: Skin is warm and dry.  Psychiatric: She has a normal mood and affect. Her speech is normal.    ED Course  Procedures (including critical care time)  Labs Reviewed  CBC - Abnormal; Notable for the following:    HCT 35.1 (*)     Platelets 136 (*)     All other components within normal limits  URINALYSIS, ROUTINE W REFLEX MICROSCOPIC - Abnormal; Notable for the following:    Leukocytes, UA SMALL (*)     All other components within normal limits  PROTIME-INR - Abnormal; Notable for the following:    Prothrombin Time 25.5 (*)     INR 2.28 (*)     All other components within normal limits  COMPREHENSIVE METABOLIC PANEL - Abnormal; Notable for the following:    Glucose, Bld 123 (*)     GFR calc non Af Amer 80 (*)      All other components within normal limits  URINE MICROSCOPIC-ADD ON - Abnormal; Notable for the following:    Bacteria, UA FEW (*)     All other components within normal limits  DIFFERENTIAL  TROPONIN I   Dg Chest 2 View  07/19/2011  *RADIOLOGY REPORT*  Clinical Data: Altered mental status.  CHEST - 2 VIEW  Comparison: 03/30/2009  Findings: Stable appearance of postoperative changes in the mediastinum and cardiac pacemaker.  Normal heart size and pulmonary vascularity.  Pulmonary hyperinflation with scattered fibrosis suggesting  emphysematous changes.  Calcified mediastinal lymph nodes.  Mild apical pleural thickening.  No focal airspace consolidation.  No blunting of costophrenic angles.  No pneumothorax.  Vague nodular opacities over the lower lungs consistent with nipple shadows.  No significant change since previous study.  IMPRESSION: Emphysematous changes and fibrosis in the lungs.  No evidence of active pulmonary disease.  Original Report Authenticated By: Marlon Pel, M.D.   Ct Head Wo Contrast  07/19/2011  *RADIOLOGY REPORT*  Clinical Data: Altered mental status.  Episode of difficulty speaking which resolved.  CT HEAD WITHOUT CONTRAST  Technique:  Contiguous axial images were obtained from the base of the skull through the vertex without contrast.  Comparison: 02/03/2010  Findings: The ventricles and sulci appear symmetrical.  No mass effect or midline shift.  No abnormal extra-axial fluid collections.  Gray-white matter junctions are distinct.  Basal cisterns are not effaced.  No evidence of acute intracranial hemorrhage.  Visualized paranasal sinuses and mastoid air cells are not opacified.  No depressed skull fractures. Vascular calcifications.  Stable appearance since previous study.  IMPRESSION: No acute intracranial abnormalities.  Original Report Authenticated By: Marlon Pel, M.D.     1. TIA (transient ischemic attack)      Date: 07/19/2011  Rate: 61  Rhythm:  normal sinus rhythm  QRS Axis: normal  Intervals: normal  ST/T Wave abnormalities: normal  Conduction Disutrbances:none  Narrative Interpretation:   Old EKG Reviewed: changes noted    MDM  Patient with transient difficulty speaking and mild confusion. History of strokes and TIAs. She is currently on Coumadin and is therapeutic. CT is reassuring. Lab work is not sure of the cause for altered mental status. After discussion with neurology CT angiography head neck was done. Does not show any severe lesions. Patient be discharged home since she started on maximal anticoagulation and has had a workup previously. She'll follow with her neurologist.        Juliet Rude. Rubin Payor, MD 07/19/11 [phone]

## 2011-07-19 NOTE — Discharge Instructions (Signed)
Transient Ischemic Attack A transient ischemic attack (TIA) is a "warning stroke" that causes stroke-like symptoms. Unlike a stroke, a TIA does not cause permanent damage to the brain. The symptoms of a TIA can happen very fast and do not last long. It is important to know the symptoms of a TIA and what to do. This can help prevent a major stroke or death. CAUSES   A TIA is caused by a temporary blockage in an artery in the brain or neck (carotid artery). The blockage does not allow the brain to get the blood supply it needs and can cause different symptoms. The blockage can be caused by either:   A blood clot.   Fatty buildup (plaque) in a neck or brain artery.  SYMPTOMS  TIA symptoms are the same as a stroke but are temporary. Symptoms can include sudden:  Numbness or weakness on one side of the body. Especially to the:   Face.   Arm.   Leg.   Trouble speaking, thinking, or confusion.   Change in vision, such as trouble seeing in one or both eyes.   Dizziness, loss of balance, or difficulty walking.   Severe headache.  ANY OF THESE SYMPTOMS MAY REPRESENT A SERIOUS PROBLEM THAT IS AN EMERGENCY. Do not wait to see if the symptoms will go away. Get medical help at once. Call your local emergency services (911 in [place] IMMEDIATELY. DO NOT drive yourself to the hospital. RISK FACTORS Risk factors can increase the risk of developing a TIA. These can include.   High blood pressure (hypertension).   High cholesterol (hyperlipidemia).   Heart disease (atherosclerosis).   Smoking.   Diabetes.   Abnormal heart rhythm (atrial fibrillation).   Family history of a stroke or heart attack.   Use of oral contraceptives (especially when combined with smoking).  DIAGNOSIS   A TIA can be diagnosed based on your:   Symptoms.   History.   Risk factors.   Tests that can help diagnose the symptoms of a TIA include:   CT or MRI scan. These tests can provide detailed images of the  brain.   Carotid ultrasound. This test looks to see if there are blockages in the carotid arteries of your neck.   Arteriography. A thin, small flexible tube (catheter) is inserted through a small cut (incision) in your groin. The catheter is threaded to your carotid or vertebral artery. A dye is then injected into the catheter. The dye highlights the arteries in your brain and allows your caregiver to look for narrowing or blockages that can cause a TIA.  TREATMENT  Based on the cause of a TIA, treatment options can vary. Treatment is important to help prevent a stroke. Treatment options can include:  Medication. Such as:   Clot-busting medicine.   Anti-platelet medicine.   Blood pressure medicine.   Blood thinner medicine.   Surgery:   Carotid endarterectomy. The carotid arteries are the arteries that supply the head and neck with oxygenated blood. This surgery can help remove fatty deposits (plaque) in the carotid arteries.   Angioplasty and stenting. This surgery uses a balloon to dilate a blocked artery in the brain. A stent is a small, metal mesh tube that can help keep an artery open  HOME CARE INSTRUCTIONS   It is important to take all medicine as told by your caregiver. If the medicine has side effects that affect you negatively, tell your caregiver right away. Do not stop taking medicine unless told   by your caregiver. Some medicines may need to be changed to better treat your condition.   Do not smoke. Talk to your caregiver on how to quit smoking.   Eat a diet high in fruits, vegetables and lean meat. Avoid a high fat, high salt diet. A dietician can you help you make healthy food choices.   Maintain a healthy weight. Develop an exercise plan approved by your caregiver.  SEEK IMMEDIATE MEDICAL CARE IF:   You develop weakness or numbness on one side of your body.   You have problems thinking, speaking, or feel confused.   You have vision changes.   You feel dizzy,  have trouble walking, or lose your balance.   You develop a severe headache.  MAKE SURE YOU:   Understand these instructions.   Will watch your condition.   Will get help right away if you are not doing well or get worse.  Document Released: 10/19/2004 Document Revised: 12/29/2010 Document Reviewed: 03/04/2009 [employer] Patient Information 2012 [employer]. 

## 2011-07-20 ENCOUNTER — Other Ambulatory Visit: Payer: Self-pay | Admitting: Pulmonary Disease

## 2011-07-20 ENCOUNTER — Telehealth: Payer: Self-pay | Admitting: *Deleted

## 2011-07-20 NOTE — Telephone Encounter (Signed)
LMOVM reminder to follow-up with neurologist and no change in coumadin dose after speaking with Dr. [name] today. This was also in reference to her home monitoring INR which was 2.1  On 07/19/11 in the afternoon and early am ED visit with TIA symptoms. Mylo Red RN

## 2011-07-26 ENCOUNTER — Encounter: Payer: Self-pay | Admitting: Cardiovascular Disease

## 2011-07-26 LAB — PROTIME-INR

## 2011-08-02 ENCOUNTER — Encounter: Payer: Self-pay | Admitting: Cardiovascular Disease

## 2011-08-02 LAB — PROTIME-INR

## 2011-08-03 DIAGNOSIS — R269 Unspecified abnormalities of gait and mobility: Secondary | ICD-10-CM | POA: Insufficient documentation

## 2011-08-03 DIAGNOSIS — R4701 Aphasia: Secondary | ICD-10-CM | POA: Insufficient documentation

## 2011-08-03 DIAGNOSIS — Z5181 Encounter for therapeutic drug level monitoring: Secondary | ICD-10-CM | POA: Insufficient documentation

## 2011-08-03 DIAGNOSIS — M6281 Muscle weakness (generalized): Secondary | ICD-10-CM | POA: Insufficient documentation

## 2011-08-03 DIAGNOSIS — G43009 Migraine without aura, not intractable, without status migrainosus: Secondary | ICD-10-CM | POA: Insufficient documentation

## 2011-08-09 ENCOUNTER — Encounter: Payer: Self-pay | Admitting: Cardiovascular Disease

## 2011-08-09 LAB — PROTIME-INR

## 2011-08-16 ENCOUNTER — Encounter: Payer: Self-pay | Admitting: Cardiovascular Disease

## 2011-08-16 LAB — PROTIME-INR

## 2011-08-24 ENCOUNTER — Telehealth: Payer: Self-pay | Admitting: Cardiovascular Disease

## 2011-08-24 NOTE — Telephone Encounter (Signed)
CALLED HOME INR SERVICE  INFORMED  HAD NOT RECEIVED INR  FROM 08-23-11  WILL REFAX./CY

## 2011-08-24 NOTE — Telephone Encounter (Signed)
New msg Rcs called to verify that fax was received yesterday-pt's INR 1.9 0731. Please call if didn't receive

## 2011-08-30 ENCOUNTER — Encounter: Payer: Self-pay | Admitting: Cardiovascular Disease

## 2011-09-06 ENCOUNTER — Encounter: Payer: Self-pay | Admitting: Cardiovascular Disease

## 2011-09-08 ENCOUNTER — Encounter: Payer: Self-pay | Admitting: Cardiovascular Disease

## 2011-09-13 ENCOUNTER — Encounter: Payer: Self-pay | Admitting: Cardiovascular Disease

## 2011-09-13 LAB — PROTIME-INR

## 2011-09-20 ENCOUNTER — Encounter: Payer: Self-pay | Admitting: Cardiovascular Disease

## 2011-09-24 HISTORY — PX: OTHER SURGICAL HISTORY: SHX169

## 2011-10-04 ENCOUNTER — Encounter: Payer: Self-pay | Admitting: Cardiovascular Disease

## 2011-10-10 ENCOUNTER — Telehealth: Payer: Self-pay | Admitting: Pulmonary Disease

## 2011-10-10 MED ORDER — FLUOXETINE HCL 20 MG PO CAPS: 20.0000 mg | ORAL_CAPSULE | Freq: Every day | ORAL | Status: DC

## 2011-10-10 NOTE — Telephone Encounter (Signed)
Per SN- She should not fight it is she has chemical imbalance, meds will help with this. Needs to start fluoxetine 20 mg # 30 1 qd with 5 refills and needs ov in 2 months to see if improving and adjust med if needed.   Spoke with pt and notified of recs per SN. She verbalized understanding and states no questions. Rx was sent to pharm. Pt to keep 11/21/11 appt with SN. Will call sooner if needed.

## 2011-10-10 NOTE — Telephone Encounter (Signed)
Spoke with Amy Lane. She states that she has stopped counseling in June after about 8 months. She continues to be depressed and has been so for longer than a year. She states that she cries every day, and "feels doom and gloom all the time". She states that she had been trying to fight asking for antidepressant for a long time, but now has decided needs to be back on something. Pt denies any suicidal ideations. Last ov with SN 05/22/11 and next appt 11-21-11. Has tried wellbutrin and lexapro in the past. Wants to have whatever has least amount of s/e. Please advise, thanks! Allergies  Allergen Reactions  . Other     Allergic to all QT drugs---  . Statins     myalgias

## 2011-10-11 ENCOUNTER — Encounter: Payer: Self-pay | Admitting: Cardiovascular Disease

## 2011-10-11 ENCOUNTER — Other Ambulatory Visit: Payer: Self-pay | Admitting: Gynecology

## 2011-10-11 LAB — PROTIME-INR

## 2011-10-16 ENCOUNTER — Ambulatory Visit (INDEPENDENT_AMBULATORY_CARE_PROVIDER_SITE_OTHER): Payer: No Typology Code available for payment source | Admitting: *Deleted

## 2011-10-16 DIAGNOSIS — I4891 Unspecified atrial fibrillation: Secondary | ICD-10-CM

## 2011-10-16 DIAGNOSIS — I495 Sick sinus syndrome: Secondary | ICD-10-CM

## 2011-10-16 DIAGNOSIS — I471 Supraventricular tachycardia: Secondary | ICD-10-CM

## 2011-10-17 ENCOUNTER — Other Ambulatory Visit: Payer: Self-pay | Admitting: Gynecology

## 2011-10-18 ENCOUNTER — Encounter: Payer: Self-pay | Admitting: Cardiovascular Disease

## 2011-10-18 LAB — PROTIME-INR

## 2011-10-23 LAB — REMOTE PACEMAKER DEVICE
AL AMPLITUDE: 2.8 mv
RV LEAD IMPEDENCE PM: 645 Ohm
RV LEAD THRESHOLD: 1.125 V
VENTRICULAR PACING PM: 0

## 2011-10-24 NOTE — Progress Notes (Signed)
Remote pacer check  

## 2011-10-25 ENCOUNTER — Encounter: Payer: Self-pay | Admitting: Cardiovascular Disease

## 2011-11-01 ENCOUNTER — Encounter: Payer: Self-pay | Admitting: Cardiovascular Disease

## 2011-11-01 LAB — PROTIME-INR

## 2011-11-07 ENCOUNTER — Encounter: Payer: Self-pay | Admitting: *Deleted

## 2011-11-08 ENCOUNTER — Encounter: Payer: Self-pay | Admitting: Cardiovascular Disease

## 2011-11-15 ENCOUNTER — Encounter: Payer: Self-pay | Admitting: Cardiovascular Disease

## 2011-11-17 ENCOUNTER — Encounter: Payer: Self-pay | Admitting: Cardiovascular Disease

## 2011-11-20 ENCOUNTER — Encounter: Payer: Self-pay | Admitting: *Deleted

## 2011-11-21 ENCOUNTER — Ambulatory Visit (INDEPENDENT_AMBULATORY_CARE_PROVIDER_SITE_OTHER): Payer: No Typology Code available for payment source | Admitting: Pulmonary Disease

## 2011-11-21 ENCOUNTER — Encounter: Payer: Self-pay | Admitting: Pulmonary Disease

## 2011-11-21 VITALS — BP 104/66 | HR 62 | Temp 98.2°F | Ht 70.5 in | Wt 130.0 lb

## 2011-11-21 DIAGNOSIS — G459 Transient cerebral ischemic attack, unspecified: Secondary | ICD-10-CM

## 2011-11-21 DIAGNOSIS — IMO0001 Reserved for inherently not codable concepts without codable children: Secondary | ICD-10-CM

## 2011-11-21 DIAGNOSIS — E7800 Pure hypercholesterolemia, unspecified: Secondary | ICD-10-CM

## 2011-11-21 DIAGNOSIS — F3289 Other specified depressive episodes: Secondary | ICD-10-CM

## 2011-11-21 DIAGNOSIS — I82409 Acute embolism and thrombosis of unspecified deep veins of unspecified lower extremity: Secondary | ICD-10-CM

## 2011-11-21 DIAGNOSIS — Q796 Ehlers-Danlos syndrome, unspecified: Secondary | ICD-10-CM

## 2011-11-21 DIAGNOSIS — F329 Major depressive disorder, single episode, unspecified: Secondary | ICD-10-CM

## 2011-11-21 DIAGNOSIS — Z23 Encounter for immunization: Secondary | ICD-10-CM

## 2011-11-21 DIAGNOSIS — Z950 Presence of cardiac pacemaker: Secondary | ICD-10-CM

## 2011-11-21 DIAGNOSIS — I679 Cerebrovascular disease, unspecified: Secondary | ICD-10-CM

## 2011-11-21 DIAGNOSIS — E039 Hypothyroidism, unspecified: Secondary | ICD-10-CM

## 2011-11-21 DIAGNOSIS — I059 Rheumatic mitral valve disease, unspecified: Secondary | ICD-10-CM

## 2011-11-21 DIAGNOSIS — I4891 Unspecified atrial fibrillation: Secondary | ICD-10-CM

## 2011-11-21 DIAGNOSIS — I495 Sick sinus syndrome: Secondary | ICD-10-CM

## 2011-11-21 MED ORDER — LORAZEPAM 1 MG PO TABS: 1.0000 mg | ORAL_TABLET | Freq: Every evening | ORAL | Status: DC | PRN

## 2011-11-21 NOTE — Progress Notes (Signed)
Subjective:    Patient ID: Amy Lane, female    DOB: 1950/07/22, 61 y.o.   MRN: [id number]  HPI 61 y/o WF here for a follow up visit... she has mult med problems as noted below... she is followed regularly by Drs Azucena Fallen for Cardiology, and Drs Purcell Nails for GI...  ~  October 13, 2009:  she was seen by Dr. Everlene Other at the [facility] at [facility] for her vulvar dermatitis & dryness & rec to use Crisco shortening Tid... she reports improved over the last 35mo- feeling better, in good spirits w/ the Lexapro helping, etc... she had f/u appts w/ DrKlein 6/11 & Walker Kehr 7/11> both felt she was doing well- noted normal atrial pacing w/ long latency on EKG & they are following for now... due for f/u fasting labs & Flu shot today... mild dermatitis on legs- LidexE written...  ~  May 23, 2010:  84mo ROV & she reports feeling "the best I have in yrs" attributed to stopping her Lipitor "too many neuro issues and not enough benefit" per DrWillis & [name];  She is looking forward to [place] vacation w/ family this summer; she was able to stop the Lexapro last January & doing fine without it now...  She denies CP, palpit, dizziness, syncope, edema, or cerebral ischemic symptoms... Due for f/u fasting labs> TChol 218, LDL 135, and she will continue to control this w/ low chol/ low fat diet...  ~  November 21, 2010:  35mo ROV & she reports that while in [place] this past summer she developed a "blood clot" meaning DVT despite being on her Coumadin & INR was 2.7 prior to the trip (when rechecked in [place] it was 4.5)> left leg swelling, pain, doppler performed by physician in [place] was pos, they questioned poss PTE due to incr dyspnea, treated w/ Lovenox along the way & f/u protimes on her Coumadin, but didn't stop her travels;  When back home checked w/ Walker Kehr- he thought bleed into muscle from prolonged INR of 4.5; she went to see DrNorris but he couldn't corroborate & treated her w/  compression hose;  Now back to baseline- swelling down, feeling better, back into exercise program & breathing better, followed in the Coumadin Clinic...    Also c/o URI, allergy symptoms, pain in ear & jaw> persistent discomfort treated w/ antihist, Nasonex, Pred, nasal saline, Celebrex, etc; DrByers planned tubes in her ears but she finally improved ~75% she says so she cancelled the tubes; they were going to send her to orthodontist vs oral surg for the TMJ & she cancelled that too...     We are going to check Fasting labs & give her the 2012 Flu vaccine today...  ~  May 22, 2011:  35mo ROV & Amy Lane is in great spirits "everything is fine" and she is now doing home coumadin monitoring w/ INR goal 2.5-3.5 range "I love it" she says...     She saw Walker Kehr in f/u 1/13 for her Ehlers-Danlos, marfanoid habitus, MVP w/ MV repair at [facility], hx TIA on chr coumadin, syncope due to long QT syndrome, & postural tachycardia syndrome (POTS); she had a left CAE 11/05 & a normal f/u duplex 11/11; doing satis & no changes made...    She saw DrKlein 3/13 for f/u SAnode dysfunction (bradycardia), PAFib, Pacer> she had last generator replacement & a lead repair 3/11; she is stable w/o recurrent AFib & pacer working well...    She has been  followed in the Lipid Clinic & currently taking Niacin500mg /d; last FLP 2/13 showed TChol 199, TG 55, HDL 76, LDL 112; much improved & they decided to continue same meds as is; her weight is a bit low however at 124#, 70" tall, and BMI~18; we discussed supplements to help her gain a few lbs...    She has followed up w/ GYN & diagnosed w/ desquamative inflammatory vaginitis (DIV) & inserting Vagifem tabs MWF; I wondered about an assoc w/ her prev dx of kraurosis vulvae & was seen at the [facility] vulva clinic by Dr. Everlene Other; she is interested in a second opinion regarding her current problem 7 will call for f/u appt at [facility]...    She also reports a skin lesion removed from the back of  her neck by DrLupton & then wider excision was carried out; [facility]ermPath in [facility] indicates it was a basal cell cancer & she is doing fine at present...  ~  November 21, 2011:  25mo ROV & Amy Lane has had two signif issues over the last few months>  1) presented to the ER 6/13 w/ an unusual event- sudden blurred vision, followed by transient paralysis of all 4 extremities & aphasia, evolved over <1H, denies HA; recall hx Ehlers-Danlos, MV repair, left CAE, & hx migraines in the past; in ER CTBrain was neg/ NAD (vasc calcif noted); CTA of head & neck showed mild arteriosclerotic changes, no stenoses, no aneurysms, etc (no change from 1/12 study); pt on Coumadin (does her own Protime checks at home w/& INR was 2.2... She was referred to DrWillis who felt this was prob a Migraine equivalent- he did an EEG which was normal; no change in therapy warranted & symptoms cleared & have not returned...  2) she has had some mod depression stemming from marital discord & she was getting counseling from [facility]; called 9/13 w/ request for antidepressant & we prescribed Prozac20 which she feels has been very helpful; they have had marital counseling as well & doing better at the moment she says...    Since he last visit she is eating better & has gained 6# up to 130#... Notes she is intol to all statins & had been on Niasp, off now on diet alone...    We reviewed prob list, meds, xrays and labs> see below for updates >>  LABS 11/13:  FLP- not at goals & we will refer to [facility];  Chems- wnl;  CBC- wnl;  TSH=1.55          Problem List:      ALLERGIC RHINITIS (ICD-477.9) - we discussed Rx w/ Zyrtek, Astepro Prn, etc... ~  CXR 3/11 showed left pacer, sternal wires, NAD...  MITRAL VALVE PROLAPSE (ICD-424.0) - followed by Walker Kehr... ~  2DEcho 11/08 showed myxomatous MV, annuloplasty ring, decr post leaflet excursion, norm LVH & wall motion... ~  NuclearStressTest 11/08 showed mild apical thinning... prev study 12/04 w/o  ischemia or infarct & EF=68%... ~  she sees [place] every 6 months- for f/u MVP (s/p MV repair), dysautonomia (s/p pacer), & Ehlers-Danlos Syndrome> notes reviewed...  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 hx AFib/Flutter w/ ablation & pacemaker placed ... she has Ehlers-Danlos synd w/ prolonged QT interval...   CARDIAC PACEMAKER IN SITU (ICD-V45.01) - f/u DrKlein w/ CTChest 4/09- neg. ~  dual chamber pacer changed 3/11 by DrKlein for end-of-life...  DYSAUTONOMIA (ICD-742.8) - prev on Proamatine 5mg  tabs per DrKlein, but this was stopped.Marland KitchenMarland Kitchen  CEREBROVASCULAR DISEASE (ICD-437.9) - on ASA 81mg /d & COUMADIN (followed in the clinic)... ~  she had a Left CAE 11/05 by [facility] for symptomatic left carotid stenosis w/ ulceration... ~  Carotid angiogram 5/08 by [facility] showed <30% right carotid stenosis ~  f/u CTA of Head & Neck 12/09 was neg= norm CTA head, & no signif carotid dis noted in neck. ~  CDoppler 2/11 showed stable mild carotid dis, left CAE w/ DPA is patent, 0-39% bilat ICA stenoses. ~  repear CDopplers 11/11 per Walker Kehr showed smooth plaque in right bulb & distal left CCA- stable; 0-39% bilat ICA stenoses...  ?DVT & poss PTE while on vacation in [place] during the summer of 2012 >> pt was on Coumadin & INR was therapeutic; she had pain & swelling in left leg w/ a pos doppler reported by a [place] physician; she was also SOB & they questioned poss PTE but didn't get scan or further eval;  On return to [place] DrNishan wondered if she might have bled into the leg muscles & sent her to DrNorris for Ortho eval; he could not corroborate the theory & treated her w/ compression stockings & she gradually improved towards her baseline...  HYPERCHOLESTEROLEMIA (ICD-272.0) - on diet alone now, prev on Lip20- this was stopped 1/12 by Walker Kehr & she feels much better off this med! ~  FLP 05/13/07 on Lip20 showed TChol 119, TG 50, HDL 55, LDL 54 ~  FLP  5/10 on Lip20 showed TChol 120, TG 43, HDL 58, LDL 54 ~  FLP 9/11 on Lip20 showed TChol 130, TG 54, HDL 59, LDL 60 ~  FLP 5/12 on diet alone showed TChol 218, TG 91, HDL 57, LDL 135... Continue low chol, low fat diet. ~  FLP 10/12 showed TChol 231, TG 86, HDL 68, LDL 151... I rec the Lipid CLinic, & they started NIACIN 500mg /d. ~  FLP 2/13 on Niacin500 showed TChol 199, TG 55, HDL 76, LDL 112... Much improved, continue same. ~  FLP 11/13 on diet alone showed TChol 231, TG 49, HDL 67, LDL 151..,. rec refer to Lipid clinic.  HYPOTHYROIDISM (ICD-244.9) - currently on SYNTHROID 19mcg/d... ~  labs 4/09 on Levoth88 showed TSH = 0.10... rec to decr Synthroid to 18mcg/d... ~  labs 9/09 on Levoth75 showed TSH = 1.67 ~  labs 5/10 on Levoth75 showed TSH = 2.92... Continue same. ~  labs 9/11 on Levoth75 showed TSH= 1.37 ~  Labs 5/12 on Levothy75 showed TSH= 4.33 ~  Labs 10/12 on Levothy75 showed TSH= 1.51... Continue same. ~  Labs 11/13 on Levothy75 showed TSH= 1.55  HEMORRHOIDS (ICD-455.6) - last colonoscopy 2/09 by DrJacobs showed only sm hems... f/u 16yrs.  GYN - followed by DrMcPhail and Dx w/ kraurosis vulvae 7/10... Prev on Premarin VagCream. ~  11/10: she discussed the Dx w/ me & will inquire about poss hyst/ BSO... ~  4/11:  eval in the [facility] by Dr Everlene Other showed vulvar dermatitis & dryness w/ rec for "Crisco" Tid! ~  4/13:  She reports that local GYN diagnosed desquamative inflammatory vaginitis (DIV) & she is currently using VagifemMWF; she indicates that she will f/u w/ [facility].   FIBROMYALGIA (ICD-729.1) - she has been doing Yoga classes and this has helped...  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  Hx of TRANSIENT ISCHEMIC ATTACK (ICD-435.9) - on ASA 81mg /d & COUMADIN> she has been followed by DrWillis; s/p left carotid endarterectomy 11/05 by [facility]. ~  1/12: f/u eval by  DrWillis reviewed...  Hx of SYNCOPE (ICD-780.2) - felt to be  neurally mediated, ? related to long QT, no recur since pacer placed...  MIGRAINE HEADACHE >> she presented w/ ?TIA symptoms 6/13 & eval was neg; review by DrWillis indicated prob Migraine & everything resolved, no recurrence...  DEPRESSION (ICD-311) - she takes LORAZEPAM 1mg Qhs for insomnia... she prev saw DrGraves for counselling in [place]... ~  11/10: poor appetite & some wt loss prob related to depression & we discussed trial Lexapro 10mg /d... ~  3/11: she is very pleased- feels better, gained 5# w/ Ensure, etc... ~  9/11:  "I feel great" & Lexapro continued... ~  She was able to stop the Lexapro 1/12 (also stopped the Lipitor) & has done well off it ever since... ~  10/13:  She called w/ situational depression due to marital stress, getting counseling, etc; started PROZAC 20mg /d & she reports much improved...  CARCINOMA, BASAL CELL (ICD-173.9) - skin cancer removed from left leg by [facility] 2008. ~  12/11:  She reports bx of rash on legs= lichen planus per DrHall, treated w/ topical ointment... ~  2/13:  She had a skin lesion removed from her neck= basal cell ca & wide excision performed by DrLupton.  Health Maintenance: ~  GI:  followed by DrJacobs w/ colonoscopy 2/09 showing only sm hems... ~  GYN:  followed by DrMcPhail, and Dr. Everlene Other at [facility] vulvar clinic... ~  Labs 9/11 showed Vit D level = 39 & rec to start 1000 u OTC supplement... ~  Immunizations:  she had TDAP 3/11... she gets yearly Flu vaccine in the Fall of the yr...    Past Surgical History  Procedure Date  . Complex mitral valve repair     at the [facility]  . Left carotid endarterectomy 11/2003    by Dr. Amada Kingfisher  . Pacemaker insertion     medtronic kappa 901  . Removed chalazion from left eye lid 09/2011    Dr. Burgess Estelle    Outpatient Encounter Prescriptions as of 11/21/2011  Medication Sig Dispense Refill  . acebutolol (SECTRAL) 200 MG capsule TAKE 1 CAPSULE BY MOUTH ONCE DAILY  90 capsule  3  .  aspirin 81 MG tablet Take 81 mg by mouth daily.        Marland Kitchen FLUoxetine (PROZAC) 20 MG capsule Take 1 capsule (20 mg total) by mouth daily.  30 capsule  5  . levothyroxine (SYNTHROID, LEVOTHROID) 75 MCG tablet TAKE 1 TABLET BY MOUTH EVERY DAY  90 tablet  3  . LORazepam (ATIVAN) 1 MG tablet Take 1 tablet (1 mg total) by mouth at bedtime as needed.  30 tablet  5  . mometasone (NASONEX) 50 MCG/ACT nasal spray Place 2 sprays into the nose 2 (two) times daily as needed. For seasonal allergies      . warfarin (COUMADIN) 5 MG tablet Take 5-7.5 mg by mouth daily. Take 7.5mg  (1.5 tablet) daily except take 5mg  (1 tablet) on Monday and friday      . DISCONTD: brinzolamide (AZOPT) 1 % ophthalmic suspension Place 1 drop into the left eye daily.      Marland Kitchen DISCONTD: niacin (NIASPAN) 500 MG CR tablet Take 1 tablet (500 mg total) by mouth at bedtime.  90 tablet  3    ALLERGIRES:  All QT drugs, and Adhesive Tape...   Current Medications, Allergies, Past Medical History, Past Surgical History, Family History, and Social History were reviewed in [employer] record.  Review of Systems         See HPI - all other systems neg except as noted... The patient complains of dyspnea on exertion.  The patient denies anorexia, fever, weight loss, weight gain, vision loss, decreased hearing, hoarseness, chest pain, syncope, peripheral edema, prolonged cough, headaches, hemoptysis, abdominal pain, melena, hematochezia, severe indigestion/heartburn, hematuria, incontinence, muscle weakness, suspicious skin lesions, transient blindness, difficulty walking, depression, unusual weight change, abnormal bleeding, enlarged lymph nodes, and angioedema.     Objective:   Physical Exam     WD, Thin, 61 y/o WF in NAD... she is 5'11"Tall and 141# = BMI~20... Vital Signs:  Reviewed... GENERAL:  Alert & oriented; pleasant & cooperative... HEENT:  [place]/AT, EOM-wnl, PERRLA, EACs-clear, TMs-wnl, NOSE-clear, THROAT-clear &  wnl. NECK:  Supple w/ fair ROM; no JVD; normal carotid impulses w/o bruits, left CAE scar; no thyromegaly or nodules palpated; no lymphadenopathy. CHEST:  Clear to P & A; without wheezes/ rales/ or rhonchi heard... HEART:  Regular Rhythm; pacer on left, without murmurs/ rubs/ or gallops detected... ABDOMEN:  Soft & nontender; normal bowel sounds; no organomegaly or masses palpated... EXT: without deformities or arthritic changes; no varicose veins/ venous insuffic/ or edema. NEURO:  CN's intact;  no focal neuro deficits... DERM:  s/p skin cancer removed from left leg; min rash noted...  RADIOLOGY DATA:  Reviewed in the EPIC EMR & discussed w/ the patient...  LABORATORY DATA:  Reviewed in the EPIC EMR & discussed w/ the patient...   Assessment & Plan:    URI/ Allergy> she estimates allergy symptoms are 75% better; rec to use Allegra, Nasal Saline, Nasonex...  MVP/ MV Repair> Tachy-Brady/ PACER> Dysautonomia>  Followed by Walker Kehr & DrKlein, doing well on Coumadin (now home monitoring) & Sectral, continue same...  Cerebrovasc Dis>  Also takes ASA 81mg /d, s/p left CAE 11/05, no cerebral ischemic symptoms...  ?DVT while in [place]> moot issue at present; stable on her Coumadin via CC; leg swelling diminished on the compression hose, no salt, elevation, etc; she is back to her exercise program & back to baseline...  CHOL>  She feels much better off the Lipitor, & control is improved w/ Niacin 500mg /d via Lipid clinic...  Hypothyroid>  Stable on the Synthroid 67mcg/d...  GYN>  Stable & followed both here & at [facility] (see above)...  EHLERS-DANLOS>  Aware, stable, she has been feeling better & in great spirits...  Migraines> Hosp 6/13 w/ ?TIA but DrWillis thought likely Migraine syndrome- resolved, neg work up, no recurrence so far...  Depression>  On Prozac20 & improved; she & husb received counseling...  Other medical problems as noted...   Patient's Medications  New Prescriptions    No medications on file  Previous Medications   ACEBUTOLOL (SECTRAL) 200 MG CAPSULE    TAKE 1 CAPSULE BY MOUTH ONCE DAILY   ASPIRIN 81 MG TABLET    Take 81 mg by mouth daily.     FLUOXETINE (PROZAC) 20 MG CAPSULE    Take 1 capsule (20 mg total) by mouth daily.   LEVOTHYROXINE (SYNTHROID, LEVOTHROID) 75 MCG TABLET    TAKE 1 TABLET BY MOUTH EVERY DAY   MOMETASONE (NASONEX) 50 MCG/ACT NASAL SPRAY    Place 2 sprays into the nose 2 (two) times daily as needed. For seasonal allergies   WARFARIN (COUMADIN) 5 MG TABLET    Take 5-7.5 mg by mouth daily. Take 7.5mg  (1.5 tablet) daily except take 5mg  (1 tablet) on Monday and friday  Modified Medications   Modified Medication  Previous Medication   LORAZEPAM (ATIVAN) 1 MG TABLET LORazepam (ATIVAN) 1 MG tablet      Take 1 tablet (1 mg total) by mouth at bedtime as needed.    Take 1 tablet (1 mg total) by mouth at bedtime as needed.  Discontinued Medications   BRINZOLAMIDE (AZOPT) 1 % OPHTHALMIC SUSPENSION    Place 1 drop into the left eye daily.   NIACIN (NIASPAN) 500 MG CR TABLET    Take 1 tablet (500 mg total) by mouth at bedtime.

## 2011-11-21 NOTE — Patient Instructions (Addendum)
Today we updated your med list in our EPIC system...    Continue your current medications the same...    We refilled the meds you requested...  Please return to our lab one morning this week for your FASTING blood work...    We will call you w/ the results...  We gave you the 2013 flu vaccine today...  Call for any questions...  Let's plan a follow up visit in another 6 months.Marland KitchenMarland Kitchen

## 2011-11-22 ENCOUNTER — Encounter: Payer: Self-pay | Admitting: Cardiovascular Disease

## 2011-11-24 ENCOUNTER — Other Ambulatory Visit (INDEPENDENT_AMBULATORY_CARE_PROVIDER_SITE_OTHER): Payer: No Typology Code available for payment source

## 2011-11-24 ENCOUNTER — Encounter: Payer: Self-pay | Admitting: Cardiovascular Disease

## 2011-11-24 DIAGNOSIS — I4891 Unspecified atrial fibrillation: Secondary | ICD-10-CM

## 2011-11-24 DIAGNOSIS — I059 Rheumatic mitral valve disease, unspecified: Secondary | ICD-10-CM

## 2011-11-24 DIAGNOSIS — E039 Hypothyroidism, unspecified: Secondary | ICD-10-CM

## 2011-11-24 DIAGNOSIS — E7800 Pure hypercholesterolemia, unspecified: Secondary | ICD-10-CM

## 2011-11-24 LAB — LIPID PANEL
Cholesterol: 231 mg/dL — ABNORMAL HIGH (ref 0–200)
Total CHOL/HDL Ratio: 3
VLDL: 9.8 mg/dL (ref 0.0–40.0)

## 2011-11-24 LAB — CBC WITH DIFFERENTIAL/PLATELET
Basophils Relative: 0.5 % (ref 0.0–3.0)
Eosinophils Absolute: 0.1 10*3/uL (ref 0.0–0.7)
Eosinophils Relative: 1.7 % (ref 0.0–5.0)
HCT: 38.7 % (ref 36.0–46.0)
Hemoglobin: 13 g/dL (ref 12.0–15.0)
Lymphs Abs: 0.9 10*3/uL (ref 0.7–4.0)
MCHC: 33.5 g/dL (ref 30.0–36.0)
MCV: 93.3 fl (ref 78.0–100.0)
Monocytes Absolute: 0.5 10*3/uL (ref 0.1–1.0)
Neutro Abs: 2.1 10*3/uL (ref 1.4–7.7)
Neutrophils Relative %: 58.1 % (ref 43.0–77.0)
RBC: 4.14 Mil/uL (ref 3.87–5.11)
WBC: 3.6 10*3/uL — ABNORMAL LOW (ref 4.5–10.5)

## 2011-11-24 LAB — BASIC METABOLIC PANEL
CO2: 28 mEq/L (ref 19–32)
Chloride: 100 mEq/L (ref 96–112)
Creatinine, Ser: 0.8 mg/dL (ref 0.4–1.2)
Potassium: 4.5 mEq/L (ref 3.5–5.1)
Sodium: 133 mEq/L — ABNORMAL LOW (ref 135–145)

## 2011-11-28 ENCOUNTER — Other Ambulatory Visit: Payer: Self-pay | Admitting: Pulmonary Disease

## 2011-11-28 DIAGNOSIS — E7800 Pure hypercholesterolemia, unspecified: Secondary | ICD-10-CM

## 2011-11-29 ENCOUNTER — Encounter: Payer: Self-pay | Admitting: Cardiovascular Disease

## 2011-11-30 ENCOUNTER — Ambulatory Visit: Payer: No Typology Code available for payment source | Admitting: Pharmacist

## 2011-12-06 ENCOUNTER — Encounter: Payer: Self-pay | Admitting: Cardiovascular Disease

## 2011-12-06 LAB — PROTIME-INR: INR: 2.1 — AB (ref 0.9–1.1)

## 2011-12-12 ENCOUNTER — Other Ambulatory Visit: Payer: Self-pay | Admitting: Cardiology

## 2011-12-12 DIAGNOSIS — I6529 Occlusion and stenosis of unspecified carotid artery: Secondary | ICD-10-CM

## 2011-12-13 ENCOUNTER — Encounter: Payer: Self-pay | Admitting: Cardiovascular Disease

## 2011-12-13 LAB — PROTIME-INR

## 2011-12-18 ENCOUNTER — Encounter (INDEPENDENT_AMBULATORY_CARE_PROVIDER_SITE_OTHER): Payer: No Typology Code available for payment source

## 2011-12-18 DIAGNOSIS — I6529 Occlusion and stenosis of unspecified carotid artery: Secondary | ICD-10-CM

## 2011-12-20 ENCOUNTER — Encounter: Payer: Self-pay | Admitting: Cardiovascular Disease

## 2011-12-24 ENCOUNTER — Encounter: Payer: Self-pay | Admitting: Internal Medicine

## 2011-12-26 ENCOUNTER — Other Ambulatory Visit: Payer: Self-pay | Admitting: Cardiovascular Disease

## 2011-12-26 ENCOUNTER — Telehealth: Payer: Self-pay | Admitting: *Deleted

## 2011-12-26 MED ORDER — WARFARIN SODIUM 5 MG PO TABS: 5.0000 mg | ORAL_TABLET | Freq: Every day | ORAL | Status: DC

## 2011-12-26 NOTE — Telephone Encounter (Signed)
   Amy Stade, MD - RE: INR, Dr [name] doses this patients coumadin, self tester More Detail >>      RE: INR, Dr [name] doses this patients coumadin, self tester      Amy Stade, MD      Sent: Mon December 25, 2011 11:03 PM    To: Carmela Hurt, RN    Cc: Alois Cliche, LPN        CRISTELLA STIVER    MRN: [id number] DOB: [birth year]     Pt Home: [phone]               Message     Already indicated to keep dose same    ----- Message -----       From: Carmela Hurt, RN       Sent: 12/25/2011   1:44 PM         To: Amy Stade, MD, Alois Cliche, LPN    Subject: INR, Dr [name] doses this patients coumadin,*         [facility]CS called INR of 1.7 done 12/20/2011             PT AWARE TO CONTINUE WITH SAME DOSE PER DR NISHAN./CY

## 2011-12-27 ENCOUNTER — Encounter: Payer: Self-pay | Admitting: Cardiovascular Disease

## 2011-12-27 LAB — PROTIME-INR

## 2011-12-28 ENCOUNTER — Other Ambulatory Visit: Payer: Self-pay | Admitting: *Deleted

## 2011-12-28 DIAGNOSIS — I4891 Unspecified atrial fibrillation: Secondary | ICD-10-CM

## 2011-12-28 DIAGNOSIS — E7800 Pure hypercholesterolemia, unspecified: Secondary | ICD-10-CM

## 2011-12-29 ENCOUNTER — Other Ambulatory Visit (INDEPENDENT_AMBULATORY_CARE_PROVIDER_SITE_OTHER): Payer: No Typology Code available for payment source

## 2011-12-29 DIAGNOSIS — I4891 Unspecified atrial fibrillation: Secondary | ICD-10-CM

## 2011-12-29 DIAGNOSIS — E7800 Pure hypercholesterolemia, unspecified: Secondary | ICD-10-CM

## 2011-12-29 LAB — HEPATIC FUNCTION PANEL
ALT: 16 U/L (ref 0–35)
AST: 21 U/L (ref 0–37)
Albumin: 3.8 g/dL (ref 3.5–5.2)
Alkaline Phosphatase: 50 U/L (ref 39–117)
Total Bilirubin: 0.5 mg/dL (ref 0.3–1.2)

## 2011-12-29 LAB — LIPID PANEL
Cholesterol: 221 mg/dL — ABNORMAL HIGH (ref 0–200)
Total CHOL/HDL Ratio: 3
Triglycerides: 48 mg/dL (ref 0.0–149.0)

## 2012-01-02 ENCOUNTER — Ambulatory Visit: Payer: No Typology Code available for payment source | Admitting: Pharmacist

## 2012-01-03 ENCOUNTER — Encounter: Payer: Self-pay | Admitting: Cardiovascular Disease

## 2012-01-10 ENCOUNTER — Encounter: Payer: Self-pay | Admitting: Cardiovascular Disease

## 2012-01-17 ENCOUNTER — Encounter: Payer: Self-pay | Admitting: Cardiovascular Disease

## 2012-01-17 LAB — PROTIME-INR

## 2012-01-22 ENCOUNTER — Ambulatory Visit (INDEPENDENT_AMBULATORY_CARE_PROVIDER_SITE_OTHER): Payer: No Typology Code available for payment source | Admitting: *Deleted

## 2012-01-22 DIAGNOSIS — I495 Sick sinus syndrome: Secondary | ICD-10-CM

## 2012-01-22 DIAGNOSIS — Z950 Presence of cardiac pacemaker: Secondary | ICD-10-CM

## 2012-01-24 ENCOUNTER — Encounter: Payer: Self-pay | Admitting: Cardiovascular Disease

## 2012-01-24 LAB — REMOTE PACEMAKER DEVICE
AL AMPLITUDE: 2.8 mv
BAMS-0001: 175 {beats}/min
RV LEAD AMPLITUDE: 16 mv
RV LEAD THRESHOLD: 0.875 V

## 2012-01-24 LAB — PROTIME-INR

## 2012-02-02 ENCOUNTER — Encounter: Payer: Self-pay | Admitting: Pulmonary Disease

## 2012-02-07 ENCOUNTER — Other Ambulatory Visit: Payer: Self-pay | Admitting: Pulmonary Disease

## 2012-02-07 ENCOUNTER — Encounter: Payer: Self-pay | Admitting: Cardiovascular Disease

## 2012-02-07 MED ORDER — FLUOXETINE HCL 20 MG PO CAPS: 20.0000 mg | ORAL_CAPSULE | Freq: Every day | ORAL | Status: DC

## 2012-02-07 NOTE — Telephone Encounter (Signed)
Per Leigh, okay to send in #90 w/ 1 refill. New rx called to [facility].

## 2012-02-21 ENCOUNTER — Encounter: Payer: Self-pay | Admitting: Cardiovascular Disease

## 2012-02-21 LAB — PROTIME-INR: INR: 2.2 — AB (ref 0.9–1.1)

## 2012-02-28 ENCOUNTER — Encounter: Payer: Self-pay | Admitting: Cardiovascular Disease

## 2012-02-28 LAB — PROTIME-INR

## 2012-03-07 ENCOUNTER — Encounter: Payer: Self-pay | Admitting: Cardiovascular Disease

## 2012-03-13 ENCOUNTER — Encounter: Payer: Self-pay | Admitting: Cardiovascular Disease

## 2012-03-13 LAB — PROTIME-INR: INR: 2.4 — AB (ref 0.9–1.1)

## 2012-03-20 ENCOUNTER — Encounter: Payer: Self-pay | Admitting: Cardiovascular Disease

## 2012-03-20 LAB — PROTIME-INR

## 2012-03-27 ENCOUNTER — Encounter: Payer: Self-pay | Admitting: Cardiovascular Disease

## 2012-03-27 LAB — PROTIME-INR: INR: 2.5 — AB (ref 0.9–1.1)

## 2012-04-03 ENCOUNTER — Encounter: Payer: Self-pay | Admitting: Cardiovascular Disease

## 2012-04-10 ENCOUNTER — Encounter: Payer: Self-pay | Admitting: Cardiovascular Disease

## 2012-04-10 LAB — PROTIME-INR: INR: 1.8 — AB (ref 0.9–1.1)

## 2012-04-17 ENCOUNTER — Encounter: Payer: No Typology Code available for payment source | Admitting: Internal Medicine

## 2012-04-19 ENCOUNTER — Encounter: Payer: Self-pay | Admitting: Internal Medicine

## 2012-04-19 ENCOUNTER — Telehealth: Payer: Self-pay | Admitting: Pulmonary Disease

## 2012-04-19 ENCOUNTER — Ambulatory Visit (INDEPENDENT_AMBULATORY_CARE_PROVIDER_SITE_OTHER): Payer: No Typology Code available for payment source | Admitting: Internal Medicine

## 2012-04-19 VITALS — BP 111/78 | HR 73 | Ht 71.0 in | Wt 140.0 lb

## 2012-04-19 DIAGNOSIS — I495 Sick sinus syndrome: Secondary | ICD-10-CM

## 2012-04-19 DIAGNOSIS — Z950 Presence of cardiac pacemaker: Secondary | ICD-10-CM

## 2012-04-19 DIAGNOSIS — I471 Supraventricular tachycardia: Secondary | ICD-10-CM

## 2012-04-19 DIAGNOSIS — F3289 Other specified depressive episodes: Secondary | ICD-10-CM

## 2012-04-19 DIAGNOSIS — I4891 Unspecified atrial fibrillation: Secondary | ICD-10-CM

## 2012-04-19 DIAGNOSIS — Q078 Other specified congenital malformations of nervous system: Secondary | ICD-10-CM

## 2012-04-19 DIAGNOSIS — F329 Major depressive disorder, single episode, unspecified: Secondary | ICD-10-CM

## 2012-04-19 LAB — PACEMAKER DEVICE OBSERVATION
AL AMPLITUDE: 2 mv
AL IMPEDENCE PM: 456 Ohm
ATRIAL PACING PM: 78
BATTERY VOLTAGE: 2.79 V
RV LEAD IMPEDENCE PM: 637 Ohm
VENTRICULAR PACING PM: 0

## 2012-04-19 NOTE — Assessment & Plan Note (Signed)
No intercurrent atrial fibrillation she remains on warfarin

## 2012-04-19 NOTE — Assessment & Plan Note (Signed)
The patient's device was interrogated.  The information was reviewed. No changes were made in the programming.    

## 2012-04-19 NOTE — Patient Instructions (Signed)
Remote monitoring is used to monitor your Pacemaker of ICD from home. This monitoring reduces the number of office visits required to check your device to one time per year. It allows [place] to keep an eye on the functioning of your device to ensure it is working properly. You are scheduled for a device check from home on 07/22/12. You may send your transmission at any time that day. If you have a wireless device, the transmission will be sent automatically. After your physician reviews your transmission, you will receive a postcard with your next transmission date.  Your physician wants you to follow-up in: 12 months with Dr Graciela Husbands. You will receive a reminder letter in the mail two months in advance. If you don't receive a letter, please call our office to schedule the follow-up appointment.

## 2012-04-19 NOTE — Progress Notes (Signed)
Patient Care Team: Michele Mcalpine, MD as PCP - General (Pulmonary Disease)   HPI  Amy Lane is a 62 y.o. female is seen in followup for sinus bradycardia status post pacemaker, status post generator replacement and lead repair March 2011.  She also has a history of Ehlers-Danlos syndrome in the context of a marfanoid habitus, mitral valve prolapse status post mitral valve repair at the [facility] clinic with prior TIA on chronic Coumadin as well as syncope due to diagnosis of long QT syndrome. SHe has had significant dysautonomic symptoms consistent with POTS    The patient denies SOB, chest pain, edema or palpitations,    She is struggled with constipation and hyperphasia since being put on fluoxetine.   Past Medical History  Diagnosis Date  . Herpes zoster   . SVT (supraventricular tachycardia)   . PSVT (paroxysmal supraventricular tachycardia)   . PAT (paroxysmal atrial tachycardia)   . History of tachycardia-bradycardia syndrome   . Atrial fibrillation   . Dysautonomia   . History of syncope   . Cardiac pacemaker     DDD  MDT  . Long term (current) use of anticoagulants   . Allergic rhinitis   . Mitral valve prolapse   . Cerebrovascular disease   . Hypercholesteremia   . Hypothyroidism   . Hemorrhoids   . Ehler's-Danlos syndrome   . Fibromyalgia   . History of transient ischemic attack   . Depression   . Carcinomas, basal cell   . Skin desquamation     inflammative vaginitis  . Abnormal gait   . Hypercholesterolemia     Past Surgical History  Procedure Laterality Date  . Complex mitral valve repair      at the [facility]  . Left carotid endarterectomy  11/2003    by Dr. Amada Kingfisher  . Pacemaker insertion      medtronic kappa 901  . Removed chalazion from left eye lid  09/2011    Dr. Burgess Estelle    Current Outpatient Prescriptions  Medication Sig Dispense Refill  . acebutolol (SECTRAL) 200 MG capsule TAKE 1 CAPSULE BY MOUTH ONCE DAILY  90 capsule  3   . aspirin 81 MG tablet Take 81 mg by mouth daily.        Marland Kitchen FLUoxetine (PROZAC) 20 MG capsule Take 1 capsule (20 mg total) by mouth daily.  90 capsule  1  . levothyroxine (SYNTHROID, LEVOTHROID) 75 MCG tablet TAKE 1 TABLET BY MOUTH EVERY DAY  90 tablet  3  . LORazepam (ATIVAN) 1 MG tablet Take 1 tablet (1 mg total) by mouth at bedtime as needed.  30 tablet  5  . niacin 500 MG tablet Take 500 mg by mouth at bedtime.      Marland Kitchen warfarin (COUMADIN) 5 MG tablet Take 1-1.5 tablets (5-7.5 mg total) by mouth daily. Take 7.5mg  (1.5 tablet) daily except take 5mg  (1 tablet) on Monday and friday  45 tablet  4  . mometasone (NASONEX) 50 MCG/ACT nasal spray Place 2 sprays into the nose 2 (two) times daily as needed. For seasonal allergies       No current facility-administered medications for this visit.    Allergies  Allergen Reactions  . Other     Allergic to all QT drugs---  . Statins     myalgias    Review of Systems negative except from HPI and PMH  Physical Exam BP 111/78  Pulse 73  Ht 5\' 11"  (1.803 m)  Wt 140 lb (63.504  kg)  BMI 19.53 kg/m2 Well developed and well nourished in no acute distress HENT normal E scleral and icterus clear Neck Supple JVP flat; carotids brisk and full Clear to ausculation Device pocket well healed; without hematoma or erythema regular rate and rhythm, no murmurs gallops or rub Soft with active bowel sounds No clubbing cyanosis none Edema Alert and oriented, grossly normal motor and sensory function Skin Warm and Dry    Assessment and  Plan

## 2012-04-19 NOTE — Assessment & Plan Note (Signed)
clinincally stable

## 2012-04-19 NOTE — Assessment & Plan Note (Signed)
80% atrial pacing

## 2012-04-19 NOTE — Assessment & Plan Note (Signed)
No recurrent svt 

## 2012-04-19 NOTE — Assessment & Plan Note (Signed)
Been treated with fluoxetine the last couple of months. She has noted increasing weight increasing constipation. We have reviewed this and this is reported for this drug. H\she is to see her PCP next week

## 2012-04-19 NOTE — Telephone Encounter (Signed)
Called, spoke with pt.  Pt reports a weight gain and increase in 2 sizes in the last 3-4 months.  Reports she has also been having constipation over the past 7-8 wks with stomach bloating.  States she has been taking ex-lax which was giving relief but hasn't noticed not as much relief over the past few days.  Yesterday, she started taking stool softner, 2 tablets qd, along with increasing water intake to 64 oz per day.  Has noticed some nausea but no vomiting or diarrhea.  Pt was seen by Dr. Graciela Husbands today.  Pt has an OV with Dr. Kriste Basque for a 6 month follow up on April 29.  Per pt, Dr. Graciela Husbands stated this was too long and pt needed to be seen next week.  We have scheduled pt to see TP on Monday, March 31 at 12 pm.  Pt aware and ok with this appt date, time, and provider.

## 2012-04-22 ENCOUNTER — Ambulatory Visit (INDEPENDENT_AMBULATORY_CARE_PROVIDER_SITE_OTHER): Payer: No Typology Code available for payment source | Admitting: Adult Health

## 2012-04-22 ENCOUNTER — Encounter: Payer: Self-pay | Admitting: Adult Health

## 2012-04-22 VITALS — BP 108/58 | HR 63 | Temp 98.0°F | Ht 71.0 in | Wt 138.2 lb

## 2012-04-22 DIAGNOSIS — F3289 Other specified depressive episodes: Secondary | ICD-10-CM

## 2012-04-22 DIAGNOSIS — F329 Major depressive disorder, single episode, unspecified: Secondary | ICD-10-CM

## 2012-04-22 DIAGNOSIS — K5900 Constipation, unspecified: Secondary | ICD-10-CM | POA: Insufficient documentation

## 2012-04-22 NOTE — Progress Notes (Signed)
Subjective:    Patient ID: Amy Lane, female    DOB: 1950/07/22, 62 y.o.   MRN: [id number]  HPI 62 y/o WF here for a follow up visit... she has mult med problems as noted below... she is followed regularly by Drs Azucena Fallen for Cardiology, and Drs Purcell Nails for GI...  ~  October 13, 2009:  she was seen by Dr. Everlene Other at the [facility] at [facility] for her vulvar dermatitis & dryness & rec to use Crisco shortening Tid... she reports improved over the last 35mo- feeling better, in good spirits w/ the Lexapro helping, etc... she had f/u appts w/ DrKlein 6/11 & Walker Kehr 7/11> both felt she was doing well- noted normal atrial pacing w/ long latency on EKG & they are following for now... due for f/u fasting labs & Flu shot today... mild dermatitis on legs- LidexE written...  ~  May 23, 2010:  84mo ROV & she reports feeling "the best I have in yrs" attributed to stopping her Lipitor "too many neuro issues and not enough benefit" per DrWillis & [name];  She is looking forward to [place] vacation w/ family this summer; she was able to stop the Lexapro last January & doing fine without it now...  She denies CP, palpit, dizziness, syncope, edema, or cerebral ischemic symptoms... Due for f/u fasting labs> TChol 218, LDL 135, and she will continue to control this w/ low chol/ low fat diet...  ~  November 21, 2010:  35mo ROV & she reports that while in [place] this past summer she developed a "blood clot" meaning DVT despite being on her Coumadin & INR was 2.7 prior to the trip (when rechecked in [place] it was 4.5)> left leg swelling, pain, doppler performed by physician in [place] was pos, they questioned poss PTE due to incr dyspnea, treated w/ Lovenox along the way & f/u protimes on her Coumadin, but didn't stop her travels;  When back home checked w/ Walker Kehr- he thought bleed into muscle from prolonged INR of 4.5; she went to see DrNorris but he couldn't corroborate & treated her w/  compression hose;  Now back to baseline- swelling down, feeling better, back into exercise program & breathing better, followed in the Coumadin Clinic...    Also c/o URI, allergy symptoms, pain in ear & jaw> persistent discomfort treated w/ antihist, Nasonex, Pred, nasal saline, Celebrex, etc; DrByers planned tubes in her ears but she finally improved ~75% she says so she cancelled the tubes; they were going to send her to orthodontist vs oral surg for the TMJ & she cancelled that too...     We are going to check Fasting labs & give her the 2012 Flu vaccine today...  ~  May 22, 2011:  35mo ROV & [name] is in great spirits "everything is fine" and she is now doing home coumadin monitoring w/ INR goal 2.5-3.5 range "I love it" she says...     She saw Walker Kehr in f/u 1/13 for her Ehlers-Danlos, marfanoid habitus, MVP w/ MV repair at [facility], hx TIA on chr coumadin, syncope due to long QT syndrome, & postural tachycardia syndrome (POTS); she had a left CAE 11/05 & a normal f/u duplex 11/11; doing satis & no changes made...    She saw DrKlein 3/13 for f/u SAnode dysfunction (bradycardia), PAFib, Pacer> she had last generator replacement & a lead repair 3/11; she is stable w/o recurrent AFib & pacer working well...    She has been  followed in the Lipid Clinic & currently taking Niacin500mg /d; last FLP 2/13 showed TChol 199, TG 55, HDL 76, LDL 112; much improved & they decided to continue same meds as is; her weight is a bit low however at 124#, 70" tall, and BMI~18; we discussed supplements to help her gain a few lbs...    She has followed up w/ GYN & diagnosed w/ desquamative inflammatory vaginitis (DIV) & inserting Vagifem tabs MWF; I wondered about an assoc w/ her prev dx of kraurosis vulvae & was seen at the [facility] vulva clinic by Dr. Everlene Other; she is interested in a second opinion regarding her current problem 7 will call for f/u appt at [facility]...    She also reports a skin lesion removed from the back of  her neck by DrLupton & then wider excision was carried out; [facility] in [facility] indicates it was a basal cell cancer & she is doing fine at present...  ~  November 21, 2011:  28mo ROV & [name] has had two signif issues over the last few months>  1) presented to the ER 6/13 w/ an unusual event- sudden blurred vision, followed by transient paralysis of all 4 extremities & aphasia, evolved over <1H, denies HA; recall hx Ehlers-Danlos, MV repair, left CAE, & hx migraines in the past; in ER CTBrain was neg/ NAD (vasc calcif noted); CTA of head & neck showed mild arteriosclerotic changes, no stenoses, no aneurysms, etc (no change from 1/12 study); pt on Coumadin (does her own Protime checks at home w/& INR was 2.2... She was referred to DrWillis who felt this was prob a Migraine equivalent- he did an EEG which was normal; no change in therapy warranted & symptoms cleared & have not returned...  2) she has had some mod depression stemming from marital discord & she was getting counseling from [facility]; called 9/13 w/ request for antidepressant & we prescribed Prozac20 which she feels has been very helpful; they have had marital counseling as well & doing better at the moment she says...    Since he last visit she is eating better & has gained 6# up to 130#... Notes she is intol to all statins & had been on Niasp, off now on diet alone...    We reviewed prob list, meds, xrays and labs> see below for updates >>  LABS 11/13:  FLP- not at goals & we will refer to [facility];  Chems- wnl;  CBC- wnl;  TSH=1.55  04/22/2012 Acute OV  Complains of increased eating/appetite, bloating, constipation (taking laxatives and stool softeners) x70months, since beginning the prozac in 09/2011. Says she had been having depression/anxiety ~09/2011 w/ increased personal stress. Was started on prozac. Says this helped. Personal life is much improved. She feels much better and does not feel depressed.  She would like to taper off prozac to see if  this will help with weight gain.  She says she has not been eating healthy and eating too much .has gas and bloating with persistent constipation.  Taking colace and exlax with some help. Had large BM yesterday, feels better.  But has gas, bloating.  No chest pain, bloody stools, dyspnea or fever.           Problem List:      ALLERGIC RHINITIS (ICD-477.9) - we discussed Rx w/ Zyrtek, Astepro Prn, etc... ~  CXR 3/11 showed left pacer, sternal wires, NAD...  MITRAL VALVE PROLAPSE (ICD-424.0) - followed by Walker Kehr... ~  2DEcho 11/08 showed myxomatous MV,  annuloplasty ring, decr post leaflet excursion, norm LVH & wall motion... ~  NuclearStressTest 11/08 showed mild apical thinning... prev study 12/04 w/o ischemia or infarct & EF=68%... ~  she sees [place] every 6 months- for f/u MVP (s/p MV repair), dysautonomia (s/p pacer), & Ehlers-Danlos Syndrome> notes reviewed...  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 hx AFib/Flutter w/ ablation & pacemaker placed ... she has Ehlers-Danlos synd w/ prolonged QT interval...   CARDIAC PACEMAKER IN SITU (ICD-V45.01) - f/u DrKlein w/ CTChest 4/09- neg. ~  dual chamber pacer changed 3/11 by DrKlein for end-of-life...  DYSAUTONOMIA (ICD-742.8) - prev on Proamatine 5mg  tabs per DrKlein, but this was stopped...  CEREBROVASCULAR DISEASE (ICD-437.9) - on ASA 81mg /d & COUMADIN (followed in the clinic)... ~  she had a Left CAE 11/05 by [facility] for symptomatic left carotid stenosis w/ ulceration... ~  Carotid angiogram 5/08 by [facility] showed <30% right carotid stenosis ~  f/u CTA of Head & Neck 12/09 was neg= norm CTA head, & no signif carotid dis noted in neck. ~  CDoppler 2/11 showed stable mild carotid dis, left CAE w/ DPA is patent, 0-39% bilat ICA stenoses. ~  repear CDopplers 11/11 per Walker Kehr showed smooth plaque in right bulb & distal left CCA- stable; 0-39% bilat ICA stenoses...  ?DVT & poss PTE  while on vacation in [place] during the summer of 2012 >> pt was on Coumadin & INR was therapeutic; she had pain & swelling in left leg w/ a pos doppler reported by a [place] physician; she was also SOB & they questioned poss PTE but didn't get scan or further eval;  On return to [place] DrNishan wondered if she might have bled into the leg muscles & sent her to DrNorris for Ortho eval; he could not corroborate the theory & treated her w/ compression stockings & she gradually improved towards her baseline...  HYPERCHOLESTEROLEMIA (ICD-272.0) - on diet alone now, prev on Lip20- this was stopped 1/12 by Walker Kehr & she feels much better off this med! ~  FLP 05/13/07 on Lip20 showed TChol 119, TG 50, HDL 55, LDL 54 ~  FLP 5/10 on Lip20 showed TChol 120, TG 43, HDL 58, LDL 54 ~  FLP 9/11 on Lip20 showed TChol 130, TG 54, HDL 59, LDL 60 ~  FLP 5/12 on diet alone showed TChol 218, TG 91, HDL 57, LDL 135... Continue low chol, low fat diet. ~  FLP 10/12 showed TChol 231, TG 86, HDL 68, LDL 151... I rec the Lipid CLinic, & they started NIACIN 500mg /d. ~  FLP 2/13 on Niacin500 showed TChol 199, TG 55, HDL 76, LDL 112... Much improved, continue same. ~  FLP 11/13 on diet alone showed TChol 231, TG 49, HDL 67, LDL 151..,. rec refer to Lipid clinic.  HYPOTHYROIDISM (ICD-244.9) - currently on SYNTHROID 40mcg/d... ~  labs 4/09 on Levoth88 showed TSH = 0.10... rec to decr Synthroid to 51mcg/d... ~  labs 9/09 on Levoth75 showed TSH = 1.67 ~  labs 5/10 on Levoth75 showed TSH = 2.92... Continue same. ~  labs 9/11 on Levoth75 showed TSH= 1.37 ~  Labs 5/12 on Levothy75 showed TSH= 4.33 ~  Labs 10/12 on Levothy75 showed TSH= 1.51... Continue same. ~  Labs 11/13 on Levothy75 showed TSH= 1.55  HEMORRHOIDS (ICD-455.6) - last colonoscopy 2/09 by DrJacobs showed only sm hems... f/u 64yrs.  GYN - followed by DrMcPhail and Dx w/ kraurosis vulvae 7/10... Prev on Premarin VagCream. ~  11/10:  she discussed the Dx w/ me & will  inquire about poss hyst/ BSO... ~  4/11:  eval in the [facility] by Dr Everlene Other showed vulvar dermatitis & dryness w/ rec for "Crisco" Tid! ~  4/13:  She reports that local GYN diagnosed desquamative inflammatory vaginitis (DIV) & she is currently using VagifemMWF; she indicates that she will f/u w/ [facility].   FIBROMYALGIA (ICD-729.1) - she has been doing Yoga classes and this has helped...  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  Hx of TRANSIENT ISCHEMIC ATTACK (ICD-435.9) - on ASA 81mg /d & COUMADIN> she has been followed by DrWillis; s/p left carotid endarterectomy 11/05 by [facility]. ~  1/12: f/u eval by DrWillis reviewed...  Hx of SYNCOPE (ICD-780.2) - felt to be neurally mediated, ? related to long QT, no recur since pacer placed...  MIGRAINE HEADACHE >> she presented w/ ?TIA symptoms 6/13 & eval was neg; review by DrWillis indicated prob Migraine & everything resolved, no recurrence...  DEPRESSION (ICD-311) - she takes LORAZEPAM 1mg Qhs for insomnia... she prev saw DrGraves for counselling in [place]... ~  11/10: poor appetite & some wt loss prob related to depression & we discussed trial Lexapro 10mg /d... ~  3/11: she is very pleased- feels better, gained 5# w/ Ensure, etc... ~  9/11:  "I feel great" & Lexapro continued... ~  She was able to stop the Lexapro 1/12 (also stopped the Lipitor) & has done well off it ever since... ~  10/13:  She called w/ situational depression due to marital stress, getting counseling, etc; started PROZAC 20mg /d & she reports much improved...  CARCINOMA, BASAL CELL (ICD-173.9) - skin cancer removed from left leg by [facility] 2008. ~  12/11:  She reports bx of rash on legs= lichen planus per DrHall, treated w/ topical ointment... ~  2/13:  She had a skin lesion removed from her neck= basal cell ca & wide excision performed by DrLupton.  Health Maintenance: ~  GI:  followed by DrJacobs w/ colonoscopy 2/09 showing  only sm hems... ~  GYN:  followed by DrMcPhail, and Dr. Everlene Other at [facility] vulvar clinic... ~  Labs 9/11 showed Vit D level = 39 & rec to start 1000 u OTC supplement... ~  Immunizations:  she had TDAP 3/11... she gets yearly Flu vaccine in the Fall of the yr...    Past Surgical History  Procedure Laterality Date  . Complex mitral valve repair      at the [facility]  . Left carotid endarterectomy  11/2003    by Dr. Amada Kingfisher  . Pacemaker insertion      medtronic kappa 901  . Removed chalazion from left eye lid  09/2011    Dr. Burgess Estelle    Outpatient Encounter Prescriptions as of 04/22/2012  Medication Sig Dispense Refill  . acebutolol (SECTRAL) 200 MG capsule TAKE 1 CAPSULE BY MOUTH ONCE DAILY  90 capsule  3  . aspirin 81 MG tablet Take 81 mg by mouth daily.        Marland Kitchen docusate sodium (COLACE) 100 MG capsule Take 200 mg by mouth daily.      Marland Kitchen FLUoxetine (PROZAC) 20 MG capsule Take 1 capsule (20 mg total) by mouth daily.  90 capsule  1  . levothyroxine (SYNTHROID, LEVOTHROID) 75 MCG tablet TAKE 1 TABLET BY MOUTH EVERY DAY  90 tablet  3  . LORazepam (ATIVAN) 1 MG tablet Take 1 tablet (1 mg total) by mouth at bedtime as needed.  30  tablet  5  . mometasone (NASONEX) 50 MCG/ACT nasal spray Place 2 sprays into the nose 2 (two) times daily as needed. For seasonal allergies      . niacin 500 MG tablet Take 500 mg by mouth at bedtime.      Marland Kitchen warfarin (COUMADIN) 5 MG tablet Take 1-1.5 tablets (5-7.5 mg total) by mouth daily. Take 7.5mg  (1.5 tablet) daily except take 5mg  (1 tablet) on Monday and friday  45 tablet  4   No facility-administered encounter medications on file as of 04/22/2012.    ALLERGIRES:  All QT drugs, and Adhesive Tape...   Current Medications, Allergies, Past Medical History, Past Surgical History, Family History, and Social History were reviewed in [employer] record.    Review of Systems        Constitutional:   No  weight loss, night sweats,   Fevers, chills, fatigue, or  lassitude.  HEENT:   No headaches,  Difficulty swallowing,  Tooth/dental problems, or  Sore throat,                No sneezing, itching, ear ache, nasal congestion, post nasal drip,   CV:  No chest pain,  Orthopnea, PND, swelling in lower extremities, anasarca, dizziness, palpitations, syncope.   GI  No heartburn, indigestion, abdominal pain, nausea, vomiting, diarrhea, , loss of appetite, bloody stools.  +constipation   Resp: No shortness of breath with exertion or at rest.  No excess mucus, no productive cough,  No non-productive cough,  No coughing up of blood.  No change in color of mucus.  No wheezing.  No chest wall deformity  Skin: no rash or lesions.  GU: no dysuria, change in color of urine, no urgency or frequency.  No flank pain, no hematuria   MS:  No joint pain or swelling.  No decreased range of motion.  No back pain.  Psych:  No change in mood or affect. No depression or anxiety.  No memory loss.         Objective:   Physical Exam     WD, Thin, 62 y/o WF in NAD Vital Signs:  Reviewed... GENERAL:  Alert & oriented; pleasant & cooperative... HEENT:  [place]/AT, EOM-wnl, PERRLA, EACs-clear, TMs-wnl, NOSE-clear, THROAT-clear & wnl. NECK:  Supple w/ fair ROM; no JVD; normal carotid impulses w/o bruits, left CAE scar; no thyromegaly or nodules palpated; no lymphadenopathy. CHEST:  Clear to P & A; without wheezes/ rales/ or rhonchi heard... HEART:  Regular Rhythm; pacer on left, without murmurs/ rubs/ or gallops detected... ABDOMEN:  Soft & nontender; normal bowel sounds; no organomegaly or masses palpated... EXT: without deformities or arthritic changes; no varicose veins/ venous insuffic/ or edema. NEURO:  CN's intact;  no focal neuro deficits... DERM:   No rash     Assessment & Plan:

## 2012-04-22 NOTE — Assessment & Plan Note (Signed)
Improved  Plan  Taper off Prozac , take every other day for 10 days and stop  Exercise , stress reducers, water, As needed   follow up Dr. Kriste Basque  In 1 month and As needed   Please contact office for sooner follow up if symptoms do not improve or worsen or seek emergency care

## 2012-04-22 NOTE — Patient Instructions (Addendum)
Taper off Prozac , take every other day for 10 days and stop  Use Stool Softner daily  Begin Align Probiotic daily  May use Miralax daily As needed  Constipation  Exercise , stress reducers, water, As needed   follow up Dr. Kriste Basque  In 1 month and As needed   Please contact office for sooner follow up if symptoms do not improve or worsen or seek emergency care

## 2012-04-22 NOTE — Assessment & Plan Note (Signed)
Constipation -suspect function w/ poor diet   Plan  Use Stool Softner daily  Begin Align Probiotic daily  May use Miralax daily As needed  Constipation  Exercise , stress reducers, water, As needed   follow up Dr. Kriste Basque  In 1 month and As needed   Please contact office for sooner follow up if symptoms do not improve or worsen or seek emergency care

## 2012-04-24 ENCOUNTER — Encounter: Payer: Self-pay | Admitting: Cardiovascular Disease

## 2012-04-24 LAB — PROTIME-INR

## 2012-05-01 ENCOUNTER — Encounter: Payer: Self-pay | Admitting: Cardiovascular Disease

## 2012-05-01 LAB — PROTIME-INR

## 2012-05-08 ENCOUNTER — Encounter: Payer: Self-pay | Admitting: Cardiovascular Disease

## 2012-05-08 LAB — PROTIME-INR

## 2012-05-15 ENCOUNTER — Encounter: Payer: Self-pay | Admitting: Cardiovascular Disease

## 2012-05-15 LAB — PROTIME-INR

## 2012-05-21 ENCOUNTER — Encounter: Payer: Self-pay | Admitting: Pulmonary Disease

## 2012-05-21 ENCOUNTER — Ambulatory Visit (INDEPENDENT_AMBULATORY_CARE_PROVIDER_SITE_OTHER): Payer: No Typology Code available for payment source | Admitting: Pulmonary Disease

## 2012-05-21 ENCOUNTER — Other Ambulatory Visit (INDEPENDENT_AMBULATORY_CARE_PROVIDER_SITE_OTHER): Payer: No Typology Code available for payment source

## 2012-05-21 VITALS — BP 102/72 | HR 82 | Temp 97.8°F | Ht 70.5 in | Wt 138.6 lb

## 2012-05-21 DIAGNOSIS — E039 Hypothyroidism, unspecified: Secondary | ICD-10-CM

## 2012-05-21 DIAGNOSIS — G459 Transient cerebral ischemic attack, unspecified: Secondary | ICD-10-CM

## 2012-05-21 DIAGNOSIS — I495 Sick sinus syndrome: Secondary | ICD-10-CM

## 2012-05-21 DIAGNOSIS — E7800 Pure hypercholesterolemia, unspecified: Secondary | ICD-10-CM

## 2012-05-21 DIAGNOSIS — I059 Rheumatic mitral valve disease, unspecified: Secondary | ICD-10-CM

## 2012-05-21 DIAGNOSIS — Z950 Presence of cardiac pacemaker: Secondary | ICD-10-CM

## 2012-05-21 DIAGNOSIS — I679 Cerebrovascular disease, unspecified: Secondary | ICD-10-CM

## 2012-05-21 DIAGNOSIS — E559 Vitamin D deficiency, unspecified: Secondary | ICD-10-CM

## 2012-05-21 DIAGNOSIS — Q796 Ehlers-Danlos syndrome, unspecified: Secondary | ICD-10-CM

## 2012-05-21 DIAGNOSIS — IMO0001 Reserved for inherently not codable concepts without codable children: Secondary | ICD-10-CM

## 2012-05-21 DIAGNOSIS — Q078 Other specified congenital malformations of nervous system: Secondary | ICD-10-CM

## 2012-05-21 LAB — CBC WITH DIFFERENTIAL/PLATELET
Basophils Relative: 0.5 % (ref 0.0–3.0)
Eosinophils Absolute: 0.2 10*3/uL (ref 0.0–0.7)
Eosinophils Relative: 4.5 % (ref 0.0–5.0)
HCT: 38.6 % (ref 36.0–46.0)
Lymphs Abs: 1.3 10*3/uL (ref 0.7–4.0)
MCHC: 35.5 g/dL (ref 30.0–36.0)
MCV: 90.3 fl (ref 78.0–100.0)
Monocytes Absolute: 0.5 10*3/uL (ref 0.1–1.0)
Neutro Abs: 1.9 10*3/uL (ref 1.4–7.7)
Neutrophils Relative %: 48.2 % (ref 43.0–77.0)
RBC: 4.28 Mil/uL (ref 3.87–5.11)
WBC: 3.9 10*3/uL — ABNORMAL LOW (ref 4.5–10.5)

## 2012-05-21 LAB — LIPID PANEL: HDL: 74.6 mg/dL (ref >39.00)

## 2012-05-21 LAB — HEPATIC FUNCTION PANEL
AST: 22 U/L (ref 0–37)
Albumin: 4.1 g/dL (ref 3.5–5.2)
Alkaline Phosphatase: 60 U/L (ref 39–117)

## 2012-05-21 LAB — BASIC METABOLIC PANEL
Chloride: 100 mEq/L (ref 96–112)
GFR: 90.79 mL/min (ref >60.00)
Glucose, Bld: 89 mg/dL (ref 70–99)
Potassium: 4.6 mEq/L (ref 3.5–5.1)
Sodium: 135 mEq/L (ref 135–145)

## 2012-05-21 MED ORDER — LORAZEPAM 1 MG PO TABS: 1.0000 mg | ORAL_TABLET | Freq: Every evening | ORAL | Status: DC | PRN

## 2012-05-21 MED ORDER — LEVOTHYROXINE SODIUM 75 MCG PO TABS: ORAL_TABLET | ORAL | Status: DC

## 2012-05-21 NOTE — Patient Instructions (Addendum)
Today we updated your med list in our EPIC system...    Continue your current medications the same...  Today we did your follow up FASTING blood work...    We will contact you w/ the results when available...   For your constipation>>    Change the colase to SENAKOT-S & take 2 at bedtime...    Add MIRALAX- one capful in water once or twice daily & do it every day...    Hopefully this will increase the frequency of your movements & keep them soft & easy to pass...  We will arrange for a follow up visit w/ DrWillis in Neurology to check you for the balance issue, clumsiness, & leg weakness that you have noticed...  Call for any questions...  Let's plan a follow up visit in 52mo, sooner if needed for problems.Marland KitchenMarland Kitchen

## 2012-05-22 ENCOUNTER — Encounter: Payer: Self-pay | Admitting: Cardiovascular Disease

## 2012-05-23 ENCOUNTER — Telehealth: Payer: Self-pay | Admitting: Neurology

## 2012-05-23 ENCOUNTER — Telehealth: Payer: Self-pay | Admitting: Pulmonary Disease

## 2012-05-23 NOTE — Telephone Encounter (Signed)
Will forward to [facility] basket, Alida aware, thanks

## 2012-05-23 NOTE — Telephone Encounter (Signed)
I'm trying to contact [facility]. I have called 3 times and have been placed on hold. I have held for over 13 mins and then the call is disconnected. I can not get through to triage. Rhonda J Cobb

## 2012-05-23 NOTE — Telephone Encounter (Signed)
I contacted [facility] back and stayed on hold for 31 minutes. I couldn't hold any longer. If they call back, please get me to the phone. Rhonda J Cobb

## 2012-05-23 NOTE — Telephone Encounter (Signed)
Cassandra at [facility] calling again in ref to previous msg she said when calling ask for triage.Raylene Everts

## 2012-05-27 NOTE — Telephone Encounter (Signed)
Per referral note on 05-24-12 Almyra Free spoke with [facility] and they will contact pt with appt. Carron Curie, CMA

## 2012-05-29 ENCOUNTER — Encounter: Payer: Self-pay | Admitting: Cardiovascular Disease

## 2012-05-29 LAB — PROTIME-INR: INR: 2.4 — AB (ref 0.9–1.1)

## 2012-06-05 ENCOUNTER — Encounter: Payer: Self-pay | Admitting: Cardiovascular Disease

## 2012-06-05 ENCOUNTER — Telehealth: Payer: Self-pay | Admitting: *Deleted

## 2012-06-05 ENCOUNTER — Encounter (HOSPITAL_COMMUNITY): Payer: Self-pay | Admitting: *Deleted

## 2012-06-05 ENCOUNTER — Other Ambulatory Visit: Payer: Self-pay

## 2012-06-05 ENCOUNTER — Emergency Department (INDEPENDENT_AMBULATORY_CARE_PROVIDER_SITE_OTHER)
Admission: EM | Admit: 2012-06-05 | Discharge: 2012-06-05 | Disposition: A | Discharge status: Home / Self Care | Payer: No Typology Code available for payment source | Source: Home / Self Care | Attending: Family Medicine | Admitting: Family Medicine

## 2012-06-05 DIAGNOSIS — S61209A Unspecified open wound of unspecified finger without damage to nail, initial encounter: Secondary | ICD-10-CM

## 2012-06-05 DIAGNOSIS — S61219A Laceration without foreign body of unspecified finger without damage to nail, initial encounter: Secondary | ICD-10-CM

## 2012-06-05 MED ORDER — ACEBUTOLOL HCL 200 MG PO CAPS: ORAL_CAPSULE | ORAL | Status: DC

## 2012-06-05 MED ORDER — MUPIROCIN 2 % EX OINT: TOPICAL_OINTMENT | Freq: Three times a day (TID) | CUTANEOUS | Status: DC

## 2012-06-05 NOTE — Telephone Encounter (Signed)
I called and left a message for the patient to callback to the office to schedule appt. 

## 2012-06-05 NOTE — ED Notes (Signed)
Cut R ring  and long finger at the PIP joints. States she cut her fingers on a broken glass 2 days ago.  Long finger bled continuously for 1 1/2 days and finally stopped this afternoon. Ring finger continued to bleed.  No bleeding now.  States bleeding worse when she washes her hands.  Very superficial small lacerations. Pt is on coumadin

## 2012-06-05 NOTE — ED Provider Notes (Signed)
History     CSN: [id number]  Arrival date & time 06/05/12  1908   First MD Initiated Contact with Patient 06/05/12 2031      Chief Complaint  Patient presents with  . Extremity Laceration    (Consider location/radiation/quality/duration/timing/severity/associated sxs/prior treatment) HPI Comments: 62 year old female with history of A. fib and chronic anticoagulation with Coumadin. Here complaining of persistent bleeding for one day and have after injuring her right ring finger and right middle finger 2 days ago. Patient was washing dishes and sustained superficial cuts with a broken glass. States that she had blood soaked dressing for 1-1/2 days. Has not seen bleeding through the dressing today but wanted to have her wounds checked. She is due to check her INR later today. She has been therapeutic between 2.5 and 3.5 has been for a long time and her current Coumadin dose. Denies mucosal bleeding or skin ecchymosis or hematomas.   Past Medical History  Diagnosis Date  . Herpes zoster   . SVT (supraventricular tachycardia)   . PSVT (paroxysmal supraventricular tachycardia)   . PAT (paroxysmal atrial tachycardia)   . History of tachycardia-bradycardia syndrome   . Atrial fibrillation   . Dysautonomia   . History of syncope   . Cardiac pacemaker     DDD  MDT  . Long term (current) use of anticoagulants   . Allergic rhinitis   . Mitral valve prolapse   . Cerebrovascular disease   . Hypercholesteremia   . Hypothyroidism   . Hemorrhoids   . Ehler's-Danlos syndrome   . Fibromyalgia   . History of transient ischemic attack   . Depression   . Carcinomas, basal cell   . Skin desquamation     inflammative vaginitis  . Abnormal gait   . Hypercholesterolemia     Past Surgical History  Procedure Laterality Date  . Complex mitral valve repair      at the [facility]  . Left carotid endarterectomy  11/2003    by Dr. Amada Kingfisher  . Pacemaker insertion      medtronic kappa [id number]   . Removed chalazion from left eye lid  09/2011    Dr. Burgess Estelle    Family History  Problem Relation Age of Onset  . Cancer      family history of  . Coronary artery disease      family history of  . Lung cancer Mother   . Prostate cancer Father   . Alzheimer's disease Father   . Heart disease Father     History  Substance Use Topics  . Smoking status: Former Smoker    Types: Cigarettes  . Smokeless tobacco: Never Used     Comment: smoked in her early 20's--smoked 4 cigs per year   . Alcohol Use: No    OB History   Grav Para Term Preterm Abortions TAB SAB Ect Mult Living                  Review of Systems  Constitutional: Negative for fever and chills.  Skin: Positive for wound.       As per history of present illness    Allergies  Other and Statins  Home Medications   Current Outpatient Rx  Name  Route  Sig  Dispense  Refill  . acebutolol (SECTRAL) 200 MG capsule      TAKE 1 CAPSULE BY MOUTH ONCE DAILY   90 capsule   3   . aspirin 81 MG tablet   Oral  Take 81 mg by mouth daily.           . Cholecalciferol (VITAMIN D) 2000 UNITS CAPS   Oral   Take 1 capsule by mouth daily.         Marland Kitchen levothyroxine (SYNTHROID, LEVOTHROID) 75 MCG tablet      TAKE 1 TABLET BY MOUTH EVERY DAY   90 tablet   3   . LORazepam (ATIVAN) 1 MG tablet   Oral   Take 1 tablet (1 mg total) by mouth at bedtime as needed.   30 tablet   5   . niacin 500 MG tablet   Oral   Take 500 mg by mouth at bedtime.         . polyethylene glycol (MIRALAX / GLYCOLAX) packet   Oral   Take 17 g by mouth daily.         . Probiotic Product (ALIGN) 4 MG CAPS   Oral   Take 1 capsule by mouth daily.         Marland Kitchen senna (SENOKOT) 8.6 MG tablet   Oral   Take 1 tablet by mouth daily.         Marland Kitchen warfarin (COUMADIN) 5 MG tablet   Oral   Take 1-1.5 tablets (5-7.5 mg total) by mouth daily. Take 7.5mg  (1.5 tablet) daily except take 5mg  (1 tablet) on Monday and friday   45 tablet   4    . diphenhydrAMINE (BENADRYL ALLERGY) 25 mg capsule   Oral   Take 25 mg by mouth every 6 (six) hours as needed for itching.         . docusate sodium (COLACE) 100 MG capsule   Oral   Take 200 mg by mouth daily.         . mupirocin ointment (BACTROBAN) 2 %   Topical   Apply topically 3 (three) times daily.   22 g   0     BP 115/71  Pulse 76  Temp(Src) 98.1 F (36.7 C) (Oral)  Resp 20  SpO2 100%  Physical Exam  Nursing note and vitals reviewed. Constitutional: She is oriented to person, place, and time. She appears well-developed and well-nourished. No distress.  Neurological: She is alert and oriented to person, place, and time.  Skin: She is not diaphoretic.  #1) right fourth finger: Small 1 cm abraded laceration in the dorsum of proximal phalanx. hemostatic. No signs of infection. #2) right  third finger: Small less than 1 cm laceration at the dorsum of proximal phalanx close to the PIP joint. Borders approximated and hemostatic. No signs of infection. No hematomas.    ED Course  Procedures (including critical care time)  Labs Reviewed - No data to display No results found.   1. Laceration of finger, initial encounter       MDM  Small superficial skin lacerations in the dorsum of right third and fourth digit at the level of proximal phalanx currently hemostatics and appeared healing well. No signs of infection. I Applied antibiotic ointment and compression dressing. Asked patient to remove dressing in 24 hours. Wound care instructions and supportive care including red flags that should prompt her return to medical attention discussed with patient and provided in writing.        Sharin Grave, MD 06/05/12 2125

## 2012-06-11 ENCOUNTER — Encounter: Payer: Self-pay | Admitting: Neurology

## 2012-06-11 DIAGNOSIS — Z5181 Encounter for therapeutic drug level monitoring: Secondary | ICD-10-CM

## 2012-06-11 DIAGNOSIS — R269 Unspecified abnormalities of gait and mobility: Secondary | ICD-10-CM

## 2012-06-11 DIAGNOSIS — R4701 Aphasia: Secondary | ICD-10-CM

## 2012-06-11 DIAGNOSIS — G43009 Migraine without aura, not intractable, without status migrainosus: Secondary | ICD-10-CM

## 2012-06-11 DIAGNOSIS — M6281 Muscle weakness (generalized): Secondary | ICD-10-CM

## 2012-06-12 ENCOUNTER — Encounter: Payer: Self-pay | Admitting: Neurology

## 2012-06-12 ENCOUNTER — Encounter: Payer: Self-pay | Admitting: Cardiovascular Disease

## 2012-06-12 ENCOUNTER — Ambulatory Visit (INDEPENDENT_AMBULATORY_CARE_PROVIDER_SITE_OTHER): Payer: PRIVATE HEALTH INSURANCE | Admitting: Neurology

## 2012-06-12 ENCOUNTER — Telehealth: Payer: Self-pay | Admitting: Cardiovascular Disease

## 2012-06-12 VITALS — BP 111/68 | HR 82 | Ht 70.75 in | Wt 135.0 lb

## 2012-06-12 DIAGNOSIS — R5383 Other fatigue: Secondary | ICD-10-CM

## 2012-06-12 DIAGNOSIS — Z8673 Personal history of transient ischemic attack (TIA), and cerebral infarction without residual deficits: Secondary | ICD-10-CM

## 2012-06-12 DIAGNOSIS — R5381 Other malaise: Secondary | ICD-10-CM

## 2012-06-12 DIAGNOSIS — R413 Other amnesia: Secondary | ICD-10-CM

## 2012-06-12 DIAGNOSIS — R531 Weakness: Secondary | ICD-10-CM

## 2012-06-12 LAB — PROTIME-INR

## 2012-06-12 MED ORDER — WARFARIN SODIUM 5 MG PO TABS: 5.0000 mg | ORAL_TABLET | Freq: Every day | ORAL | Status: DC

## 2012-06-12 NOTE — Telephone Encounter (Signed)
Refill Request    Pt needs a refill of WARFARIN.

## 2012-06-12 NOTE — Telephone Encounter (Signed)
PT AWARE REFILL SENT VIA EPIC  FOR WARFARIN./CY

## 2012-06-12 NOTE — Progress Notes (Signed)
Reason for visit: Gait disorder  Amy Lane is an 62 y.o. female  History of present illness:  Amy Lane is a 62 year old right-handed white female with a history of Ehlers Danlos syndrome. The patient has cerebrovascular disease, and she has had a left carotid endarterectomy done previously. The patient has atrial fibrillation, and she has a pacemaker in place. The patient has a history of DVT, and pulmonary embolism, and she is on chronic anticoagulation. The patient comes to this office with various complaints. The patient indicates that over the last 18 months, she has had some problems with word finding issues, and decreased vocabulary. The patient is having some problems with directions while driving, and she may get turned around and confused with directions. The patient has also noted some mild leg weakness. The patient denies any problems getting up out of a chair or climbing stairs, but if she is squatting down, she has difficulty standing up without assistance. The patient has some mild numbness in the middle toes of the feet. The patient denies any low back pain or pain down the legs. The patient has occasionally had some left elbow discomfort and some left knee pain. The patient has noted some mild changes in bladder control, and she has increased frequency and difficulty controlling the bladder at times. The patient also has constipation issues. The patient feels as if her balance is slightly altered, and she is having difficulty climbing ladders or stepladders. The patient comes back to this office for further evaluation. A recent thyroid profile was unremarkable.  Past Medical History  Diagnosis Date  . Herpes zoster   . SVT (supraventricular tachycardia)   . PSVT (paroxysmal supraventricular tachycardia)   . PAT (paroxysmal atrial tachycardia)   . History of tachycardia-bradycardia syndrome   . Atrial fibrillation   . Dysautonomia   . History of syncope   . Cardiac  pacemaker     DDD  MDT  . Long term (current) use of anticoagulants   . Allergic rhinitis   . Mitral valve prolapse   . Cerebrovascular disease   . Hypercholesteremia   . Hypothyroidism   . Hemorrhoids   . Ehler's-Danlos syndrome   . Fibromyalgia   . History of transient ischemic attack   . Depression   . Carcinomas, basal cell   . Skin desquamation     inflammative vaginitis  . Abnormal gait   . Hypercholesterolemia   . Headache     Migraine    Past Surgical History  Procedure Laterality Date  . Complex mitral valve repair      at the [facility]  . Left carotid endarterectomy  11/2003    by Dr. Amada Kingfisher  . Pacemaker insertion      medtronic kappa 901  . Removed chalazion from left eye lid  09/2011    Dr. Burgess Estelle    Family History  Problem Relation Age of Onset  . Cancer      family history of  . Coronary artery disease      family history of  . Leukemia      Nephew  . Lung cancer Mother   . Prostate cancer Father   . Alzheimer's disease Father   . Heart disease Father   . Mitral valve prolapse Brother   . Diabetes Sister   . Mitral valve prolapse Sister     Social history:  reports that she has quit smoking. Her smoking use included Cigarettes. She smoked 0.00 packs per day.  She has never used smokeless tobacco. She reports that  drinks alcohol. She reports that she does not use illicit drugs.  Allergies:  Allergies  Allergen Reactions  . Other     Allergic to all QT drugs---  . Statins     myalgias    Medications:  Current Outpatient Prescriptions on File Prior to Visit  Medication Sig Dispense Refill  . acebutolol (SECTRAL) 200 MG capsule TAKE 1 CAPSULE BY MOUTH ONCE DAILY  90 capsule  3  . aspirin 81 MG tablet Take 81 mg by mouth daily.        . Cholecalciferol (VITAMIN D) 2000 UNITS CAPS Take 1 capsule by mouth daily.      Marland Kitchen levothyroxine (SYNTHROID, LEVOTHROID) 75 MCG tablet TAKE 1 TABLET BY MOUTH EVERY DAY  90 tablet  3  . LORazepam  (ATIVAN) 1 MG tablet Take 1 tablet (1 mg total) by mouth at bedtime as needed.  30 tablet  5  . mupirocin ointment (BACTROBAN) 2 % Apply topically 3 (three) times daily.  22 g  0  . niacin 500 MG tablet Take 500 mg by mouth at bedtime.      . polyethylene glycol (MIRALAX / GLYCOLAX) packet Take 17 g by mouth daily.      . Probiotic Product (ALIGN) 4 MG CAPS Take 1 capsule by mouth daily.      Marland Kitchen senna (SENOKOT) 8.6 MG tablet Take 1 tablet by mouth daily.       No current facility-administered medications on file prior to visit.    ROS:  Out of a complete 14 system review of symptoms, the patient complains only of the following symptoms, and all other reviewed systems are negative.  Weight gain Snoring Constipation Urinary incontinence Joint pain Allergies, runny nose Memory loss, confusion, numbness, weakness, tremor Decreased energy Insomnia, restless legs  Blood pressure 111/68, pulse 82, height 5' 10.75" (1.797 m), weight 135 lb (61.236 kg).  Physical Exam  General: The patient is alert and cooperative at the time of the examination.  Skin: No significant peripheral edema is noted.   Neurologic Exam  Cranial nerves: Facial symmetry is present. Speech is normal, no aphasia or dysarthria is noted. Extraocular movements are full. Visual fields are full.  Motor: The patient has good strength in all 4 extremities. The patient does have some problems arising from a squatting position. The patient is able to stand from a seated position with arms crossed.  Sensory Examination: The patient has no stocking pattern pinprick sensory deficit, and vibration sensation is symmetric throughout.  Coordination: The patient has good finger-nose-finger and heel-to-shin bilaterally.  Gait and station: The patient has a normal gait. Tandem gait is slightly unsteady. Romberg is negative. No drift is seen.  Reflexes: Deep tendon reflexes are symmetric.   Assessment/Plan:  1. Ehlers Danlos  syndrome  2. History of migraine  3. Cerebrovascular disease  4. Subjective word finding problems  5. Mild gait disturbance, lower extremity weakness  The patient has very minimal gait instability on clinical examination. The patient will be set up for blood work today, and she will undergo a CT scan of the brain and cervical spine. We may consider EMG and nerve conduction study evaluation to exclude the possibility of a low-grade myopathic disorder causing some of the weakness and balance issues. The patient may require formal neuropsychological evaluation to follow the mild cognitive deficits that she complains of. The patient has a history of fibromyalgia, which can be associated with some  cognitive issues. The patient will followup in 6 months.  Marlan Palau MD 06/12/2012 7:53 PM  [facility] [address] [place], [place] [address]  Phone [phone] Fax [phone]

## 2012-06-13 LAB — ANA W/REFLEX: Anti Nuclear Antibody(ANA): NEGATIVE

## 2012-06-13 LAB — LYME, TOTAL AB TEST/REFLEX: Lyme Ab: <0.91 index (ref 0.00–0.90)

## 2012-06-13 LAB — CK: Total CK: 69 U/L (ref 24–173)

## 2012-06-13 LAB — CERULOPLASMIN: Ceruloplasmin: 25.5 mg/dL (ref 16.0–45.0)

## 2012-06-13 LAB — VITAMIN B12: Vitamin B-12: 424 pg/mL (ref 211–946)

## 2012-06-14 ENCOUNTER — Telehealth: Payer: Self-pay | Admitting: *Deleted

## 2012-06-14 NOTE — Telephone Encounter (Signed)
Message copied by Salome Spotted on Fri Jun 14, 2012  1:37 PM ------      Message from: Stephanie Acre      Created: Thu Jun 13, 2012  5:18 PM       Please call the patient. The blood work results are unremarkable. Thank you.            ----- Message -----         From: [facility] Lab Results In Interface         Sent: 06/13/2012   4:39 PM           To: York Spaniel, MD                   ------

## 2012-06-14 NOTE — Telephone Encounter (Signed)
Called patient Amy Lane and let her know her results where ready to be read.

## 2012-06-15 ENCOUNTER — Encounter: Payer: Self-pay | Admitting: Pulmonary Disease

## 2012-06-15 NOTE — Progress Notes (Signed)
Subjective:    Patient ID: Amy Lane, female    DOB: 06/27/1950, 62 y.o.   MRN: [id number]  HPI 62 y/o WF here for a follow up visit... she has mult med problems as noted below... she is followed regularly by Drs Azucena Fallen for Cardiology, and Drs Purcell Nails for GI...  ~  October 13, 2009:  she was seen by Dr. Everlene Other at the [facility] at [facility] for her vulvar dermatitis & dryness & rec to use Crisco shortening Tid... she reports improved over the last 48mo- feeling better, in good spirits w/ the Lexapro helping, etc... she had f/u appts w/ DrKlein 6/11 & Walker Kehr 7/11> both felt she was doing well- noted normal atrial pacing w/ long latency on EKG & they are following for now... due for f/u fasting labs & Flu shot today... mild dermatitis on legs- LidexE written...  ~  May 23, 2010:  40mo ROV & she reports feeling "the best I have in yrs" attributed to stopping her Lipitor "too many neuro issues and not enough benefit" per DrWillis & [name];  She is looking forward to [place] vacation w/ family this summer; she was able to stop the Lexapro last January & doing fine without it now...  She denies CP, palpit, dizziness, syncope, edema, or cerebral ischemic symptoms... Due for f/u fasting labs> TChol 218, LDL 135, and she will continue to control this w/ low chol/ low fat diet...  ~  November 21, 2010:  48mo ROV & she reports that while in [place] this past summer she developed a "blood clot" meaning DVT despite being on her Coumadin & INR was 2.7 prior to the trip (when rechecked in [place] it was 4.5)> left leg swelling, pain, doppler performed by physician in [place] was pos, they questioned poss PTE due to incr dyspnea, treated w/ Lovenox along the way & f/u protimes on her Coumadin, but didn't stop her travels;  When back home checked w/ Walker Kehr- he thought bleed into muscle from prolonged INR of 4.5; she went to see DrNorris but he couldn't corroborate & treated her w/  compression hose;  Now back to baseline- swelling down, feeling better, back into exercise program & breathing better, followed in the Coumadin Clinic...    Also c/o URI, allergy symptoms, pain in ear & jaw> persistent discomfort treated w/ antihist, Nasonex, Pred, nasal saline, Celebrex, etc; DrByers planned tubes in her ears but she finally improved ~75% she says so she cancelled the tubes; they were going to send her to orthodontist vs oral surg for the TMJ & she cancelled that too...     We are going to check Fasting labs & give her the 2012 Flu vaccine today...  ~  May 22, 2011:  48mo ROV & Amy Lane is in great spirits "everything is fine" and she is now doing home coumadin monitoring w/ INR goal 2.5-3.5 range "I love it" she says...     She saw Walker Kehr in f/u 1/13 for her Ehlers-Danlos, marfanoid habitus, MVP w/ MV repair at [facility], hx TIA on chr coumadin, syncope due to long QT syndrome, & postural tachycardia syndrome (POTS); she had a left CAE 11/05 & a normal f/u duplex 11/11; doing satis & no changes made...    She saw DrKlein 3/13 for f/u SAnode dysfunction (bradycardia), PAFib, Pacer> she had last generator replacement & a lead repair 3/11; she is stable w/o recurrent AFib & pacer working well...    She has been  followed in the Lipid Clinic & currently taking Niacin500mg /d; last FLP 2/13 showed TChol 199, TG 55, HDL 76, LDL 112; much improved & they decided to continue same meds as is; her weight is a bit low however at 124#, 70" tall, and BMI~18; we discussed supplements to help her gain a few lbs...    She has followed up w/ GYN & diagnosed w/ desquamative inflammatory vaginitis (DIV) & inserting Vagifem tabs MWF; I wondered about an assoc w/ her prev dx of kraurosis vulvae & was seen at the [facility] vulva clinic by Dr. Everlene Other; she is interested in a second opinion regarding her current problem 7 will call for f/u appt at [facility]...    She also reports a skin lesion removed from the back of  her neck by DrLupton & then wider excision was carried out; [facility] in [facility] indicates it was a basal cell cancer & she is doing fine at present...  ~  November 21, 2011:  24mo ROV & Amy Lane has had two signif issues over the last few months>  1) presented to the ER 6/13 w/ an unusual event- sudden blurred vision, followed by transient paralysis of all 4 extremities & aphasia, evolved over <1H, denies HA; recall hx Ehlers-Danlos, MV repair, left CAE, & hx migraines in the past; in ER CTBrain was neg/ NAD (vasc calcif noted); CTA of head & neck showed mild arteriosclerotic changes, no stenoses, no aneurysms, etc (no change from 1/12 study); pt on Coumadin (does her own Protime checks at home w/& INR was 2.2... She was referred to DrWillis who felt this was prob a Migraine equivalent- he did an EEG which was normal; no change in therapy warranted & symptoms cleared & have not returned...  2) she has had some mod depression stemming from marital discord & she was getting counseling from [facility]; called 9/13 w/ request for antidepressant & we prescribed Prozac20 which she feels has been very helpful; they have had marital counseling as well & doing better at the moment she says...    Since her last visit she is eating better & has gained 6# up to 130#... Notes she is intol to all statins & had been on Niasp, off now on diet alone...    We reviewed prob list, meds, xrays and labs> see below for updates >> Given 2013 Flu vaccine...  LABS 11/13:  FLP- not at goals & we will refer to [facility];  Chems- wnl;  CBC- wnl;  TSH=1.55   ~  May 21, 2012:  24mo ROV & Amy Lane has had a good interval- notes some recent allergy symptoms but OTC Benedryl is helping; she is also c/o some constipation & we discussed regular dosing w/ Miralax & Senakot-S; also notes some "clumsiness, weakness in legs, and balance off" stating this has been off & on for yrs & she would like further eval- refer to Neuro per request... We reviewed the  following medical problems during today's office visit >>     AR> on Benedryl & Nasonex prn; breathing is good w/o cough, sput, SOB, etc...    Hx Dysautonomia (POTS) & Ehlers-Danlos syndrome> followed by Walker Kehr & DrKlein; stable on current meds...    MVP, Myxomatous valve- s/p MVrepair (in [place])> on ASA81; she is followed by DrKlein- seen 3/14 & stable pacer & Coumadin, no changes made...    Hx Tachy-brady, Fib-flutter w/ ablation & pacer> on Sectral200/d, & Coumadin; she does her own Protimes at home & called to the  CC for adjustments...     Cerebrovasc Dis> s/p left CAE 2005 by [facility] for asymptomatic left carotid stenosis w/ ulceration, & f/u CDopplers all ok...    ?DVT & ?PE while on Coumadin (during trip to [place])> she now checks her own protimes like they do in [place]...    CHOL> on Niacin500; FLP 4/14 shows TChol 233, TG 78, HDL 75, LDL 146; she is intol to all meds- offered lipid clinic referral...    Hypothy> on Synthroid75; lab 4/14 shows TSH= 3.10; continue same meds...    GI- Constip & Hems> on Align; c/o constip & asked to start Miralax, Senakot-S, added fiber; last colon 2009 by DrJacobs w/ hems only & f/u planned 88yrs...    GYN- kraurrosis vulvae (DIV)> local GYN, [facility] vulva clinic treatment & she is up to date on screening...    Migraine HAs & ?TIA> she is followed by [facility] Neuro- DrWillis...    Anxiety/ Depression> on Ativan 1mg  prn & off prev Prozac20 (it was really helping but "I'm past that now" & she felt this contrib to incr appetite & wt gain (Note- 130# & BMI=18 but she states "I'm up 3 pant sizes")... We reviewed prob list, meds, xrays and labs> see below for updates >>  LABS 4/14:  FLP- not at goals on diet alone but intol to all meds;  Chems- wnl;  CBC- wnl;  TSH=3.10;  VitD=19 & Rec VitD OTC supplement ~2000u daily...           Problem List:      ALLERGIC RHINITIS (ICD-477.9) - we discussed Rx w/ Zyrtek, Astepro Prn, etc... ~  CXR 3/11 showed left  pacer, sternal wires, NAD.Marland Kitchen. ~  CXR 6/13 showed stable post-op changes and pacer, pulm hyperinflation & scattered scarringt, calcif mediastinal nodes, no focal opacities, NAD...  MITRAL VALVE PROLAPSE (ICD-424.0) - followed by Walker Kehr... ~  2DEcho 11/08 showed myxomatous MV, annuloplasty ring, decr post leaflet excursion, norm LVH & wall motion... ~  NuclearStressTest 11/08 showed mild apical thinning... prev study 12/04 w/o ischemia or infarct & EF=68%... ~  she sees [place] every 6 months- for f/u MVP (s/p MV repair), dysautonomia (s/p pacer), & Ehlers-Danlos Syndrome> notes reviewed...  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 hx AFib/Flutter w/ ablation & pacemaker placed ... she has Ehlers-Danlos synd w/ prolonged QT interval...   CARDIAC PACEMAKER IN SITU (ICD-V45.01) - f/u DrKlein w/ CTChest 4/09- neg. ~  dual chamber pacer changed 3/11 by DrKlein for end-of-life... ~  EKG shows NSR, 1st degree AVB, RBBB...  DYSAUTONOMIA (ICD-742.8) - prev on Proamatine 5mg  tabs per DrKlein, but this was stopped...  CEREBROVASCULAR DISEASE (ICD-437.9) - on ASA 81mg /d & COUMADIN (followed in the clinic)... ~  she had a Left CAE 11/05 by [facility] for symptomatic left carotid stenosis w/ ulceration... ~  Carotid angiogram 5/08 by [facility] showed <30% right carotid stenosis ~  f/u CTA of Head & Neck 12/09 was neg= norm CTA head, & no signif carotid dis noted in neck. ~  CDoppler 2/11 showed stable mild carotid dis, left CAE w/ DPA is patent, 0-39% bilat ICA stenoses. ~  repear CDopplers 11/11 per Walker Kehr showed smooth plaque in right bulb & distal left CCA- stable; 0-39% bilat ICA stenoses... ~  CT Head 6/13 is neg- No acute intracranial abnormalities... ~  CTA Brain 6/13 showed mild atherosclerotic dis in the carotid siphon regions bilat w/o stenoses, otherw norm CTA brain w/o lesions... ~  CT Angio  Neck 6/13 via ER showed mild nonstenotic  arteriosclerotic dis at both carotid bifurcations w/o signif stenoses... ~  CDopplers 11/13 showed mild soft smooth plaque w/ 0-39% right ICAstenosis & 40-59% left ICA stenosis, vertebrals are antegrade...  ?DVT & poss PTE while on vacation in [place] during the summer of 2012 >> pt was on Coumadin & INR was therapeutic; she had pain & swelling in left leg w/ a pos doppler reported by a [place] physician; she was also SOB & they questioned poss PTE but didn't get scan or further eval;  On return to [place] DrNishan wondered if she might have bled into the leg muscles & sent her to DrNorris for Ortho eval; he could not corroborate the theory & treated her w/ compression stockings & she gradually improved towards her baseline...  HYPERCHOLESTEROLEMIA (ICD-272.0) - on diet alone now, prev on Lip20- this was stopped 1/12 by Walker Kehr & she feels much better off this med! ~  FLP 05/13/07 on Lip20 showed TChol 119, TG 50, HDL 55, LDL 54 ~  FLP 5/10 on Lip20 showed TChol 120, TG 43, HDL 58, LDL 54 ~  FLP 9/11 on Lip20 showed TChol 130, TG 54, HDL 59, LDL 60 ~  FLP 5/12 on diet alone showed TChol 218, TG 91, HDL 57, LDL 135... Continue low chol, low fat diet. ~  FLP 10/12 showed TChol 231, TG 86, HDL 68, LDL 151... I rec the Lipid CLinic, & they started NIACIN 500mg /d. ~  FLP 2/13 on Niacin500 showed TChol 199, TG 55, HDL 76, LDL 112... Much improved, continue same. ~  FLP 11/13 on diet alone showed TChol 231, TG 49, HDL 67, LDL 151..,. rec refer to Lipid clinic. ~  FLP 4/14 on diet + >Niacin showed TChol 233, TG 78, HDL 75, LDL 146   HYPOTHYROIDISM (EAV-409.8) - currently on SYNTHROID 71mcg/d... ~  labs 4/09 on Levoth88 showed TSH = 0.10... rec to decr Synthroid to 64mcg/d... ~  labs 9/09 on Levoth75 showed TSH = 1.67 ~  labs 5/10 on Levoth75 showed TSH = 2.92... Continue same. ~  labs 9/11 on Levoth75 showed TSH= 1.37 ~  Labs 5/12 on Levothy75 showed TSH= 4.33 ~  Labs 10/12 on Levothy75 showed TSH= 1.51...  Continue same. ~  Labs 11/13 on Levothy75 showed TSH= 1.55 ~  Labs 4/14 on Levothy75 showed TSH= 3.10  HEMORRHOIDS (ICD-455.6) - last colonoscopy 2/09 by DrJacobs showed only sm hems... f/u 71yrs.  GYN - followed by DrMcPhail and Dx w/ kraurosis vulvae 7/10... Prev on Premarin VagCream. ~  11/10: she discussed the Dx w/ me & will inquire about poss hyst/ BSO... ~  4/11:  eval in the [facility] by Dr Everlene Other showed vulvar dermatitis & dryness w/ rec for "Crisco" Tid! ~  4/13:  She reports that local GYN diagnosed desquamative inflammatory vaginitis (DIV) & she is currently using VagifemMWF; she indicates that she will f/u w/ [facility].   FIBROMYALGIA (ICD-729.1) - she has been doing Yoga classes and this has helped...  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  Hx of TRANSIENT ISCHEMIC ATTACK (ICD-435.9) - on ASA 81mg /d & COUMADIN> she has been followed by DrWillis; s/p left carotid endarterectomy 11/05 by [facility]. ~  1/12: f/u eval by DrWillis reviewed...  Hx of SYNCOPE (ICD-780.2) - felt to be neurally mediated, ? related to long QT, no recur since pacer placed...  MIGRAINE HEADACHE >> she presented w/ ?TIA symptoms 6/13 & eval was neg; review by DrWillis  indicated prob Migraine & everything resolved, no recurrence...  DEPRESSION (ICD-311) - she takes LORAZEPAM 1mg Qhs for insomnia... she prev saw DrGraves for counselling in [place]... ~  11/10: poor appetite & some wt loss prob related to depression & we discussed trial Lexapro 10mg /d... ~  3/11: she is very pleased- feels better, gained 5# w/ Ensure, etc... ~  9/11:  "I feel great" & Lexapro continued... ~  She was able to stop the Lexapro 1/12 (also stopped the Lipitor) & has done well off it ever since... ~  10/13:  She called w/ situational depression due to marital stress, getting counseling, etc; started PROZAC 20mg /d & she reports much improved...  CARCINOMA, BASAL CELL (ICD-173.9) - skin  cancer removed from left leg by [facility] 2008. ~  12/11:  She reports bx of rash on legs= lichen planus per DrHall, treated w/ topical ointment... ~  2/13:  She had a skin lesion removed from her neck= basal cell ca & wide excision performed by DrLupton.  Health Maintenance: ~  GI:  followed by DrJacobs w/ colonoscopy 2/09 showing only sm hems... ~  GYN:  followed by DrMcPhail, and Dr. Everlene Other at [facility] vulvar clinic... ~  Labs 9/11 showed Vit D level = 39 & rec to start 1000 u OTC supplement... ~  Immunizations:  she had TDAP 3/11... she gets yearly Flu vaccine in the Fall of the yr...    Past Surgical History  Procedure Laterality Date  . Complex mitral valve repair      at the [facility]  . Left carotid endarterectomy  11/2003    by Dr. Amada Kingfisher  . Pacemaker insertion      medtronic kappa [id number]  . Removed chalazion from left eye lid  09/2011    Dr. Burgess Estelle    Outpatient Encounter Prescriptions as of 05/21/2012  Medication Sig Dispense Refill  . aspirin 81 MG tablet Take 81 mg by mouth daily.        . Cholecalciferol (VITAMIN D) 2000 UNITS CAPS Take 1 capsule by mouth daily.      Marland Kitchen levothyroxine (SYNTHROID, LEVOTHROID) 75 MCG tablet TAKE 1 TABLET BY MOUTH EVERY DAY  90 tablet  3  . LORazepam (ATIVAN) 1 MG tablet Take 1 tablet (1 mg total) by mouth at bedtime as needed.  30 tablet  5  . niacin 500 MG tablet Take 500 mg by mouth at bedtime.      . Probiotic Product (ALIGN) 4 MG CAPS Take 1 capsule by mouth daily.      . [DISCONTINUED] acebutolol (SECTRAL) 200 MG capsule TAKE 1 CAPSULE BY MOUTH ONCE DAILY  90 capsule  3  . [DISCONTINUED] diphenhydrAMINE (BENADRYL ALLERGY) 25 mg capsule Take 25 mg by mouth every 6 (six) hours as needed for itching.      . [DISCONTINUED] docusate sodium (COLACE) 100 MG capsule Take 200 mg by mouth daily.      . [DISCONTINUED] levothyroxine (SYNTHROID, LEVOTHROID) 75 MCG tablet TAKE 1 TABLET BY MOUTH EVERY DAY  90 tablet  3  . [DISCONTINUED]  LORazepam (ATIVAN) 1 MG tablet Take 1 tablet (1 mg total) by mouth at bedtime as needed.  30 tablet  5  . [DISCONTINUED] warfarin (COUMADIN) 5 MG tablet Take 1-1.5 tablets (5-7.5 mg total) by mouth daily. Take 7.5mg  (1.5 tablet) daily except take 5mg  (1 tablet) on Monday and friday  45 tablet  4  . [DISCONTINUED] FLUoxetine (PROZAC) 20 MG capsule Take 1 capsule (20 mg total) by mouth  daily.  90 capsule  1  . [DISCONTINUED] mometasone (NASONEX) 50 MCG/ACT nasal spray Place 2 sprays into the nose 2 (two) times daily as needed. For seasonal allergies       No facility-administered encounter medications on file as of 05/21/2012.    Allergies  Allergen Reactions  . Other     Allergic to all QT drugs---  . Statins     myalgias    Current Medications, Allergies, Past Medical History, Past Surgical History, Family History, and Social History were reviewed in [employer] record.    Review of Systems         See HPI - all other systems neg except as noted... The patient complains of dyspnea on exertion.  The patient denies anorexia, fever, weight loss, weight gain, vision loss, decreased hearing, hoarseness, chest pain, syncope, peripheral edema, prolonged cough, headaches, hemoptysis, abdominal pain, melena, hematochezia, severe indigestion/heartburn, hematuria, incontinence, muscle weakness, suspicious skin lesions, transient blindness, difficulty walking, depression, unusual weight change, abnormal bleeding, enlarged lymph nodes, and angioedema.     Objective:   Physical Exam     WD, Thin, 62 y/o WF in NAD... she is 5'11"Tall and 141# = BMI~20... Vital Signs:  Reviewed... GENERAL:  Alert & oriented; pleasant & cooperative... HEENT:  [place]/AT, EOM-wnl, PERRLA, EACs-clear, TMs-wnl, NOSE-clear, THROAT-clear & wnl. NECK:  Supple w/ fair ROM; no JVD; normal carotid impulses w/o bruits, left CAE scar; no thyromegaly or nodules palpated; no lymphadenopathy. CHEST:  Clear to P &  A; without wheezes/ rales/ or rhonchi heard... HEART:  Regular Rhythm; pacer on left, without murmurs/ rubs/ or gallops detected... ABDOMEN:  Soft & nontender; normal bowel sounds; no organomegaly or masses palpated... EXT: without deformities or arthritic changes; no varicose veins/ venous insuffic/ or edema. NEURO:  CN's intact;  no focal neuro deficits... DERM:  s/p skin cancer removed from left leg; min rash noted...  RADIOLOGY DATA:  Reviewed in the [facility] EMR & discussed w/ the patient...  LABORATORY DATA:  Reviewed in the EP[facility] EMR & discussed w/ the patient...   Assessment & Plan:    URI/ Allergy> allergy symptoms are mild; rec to use Allegra, Nasal Saline, Nasonex...  MVP/ MV Repair> Tachy-Brady/ PACER> Dysautonomia>  Followed by Walker Kehr & DrKlein, doing well on Coumadin (now home monitoring) & Sectral, continue same...  Cerebrovasc Dis>  Also takes ASA 81mg /d, s/p left CAE 11/05, no cerebral ischemic symptoms...  ?DVT while in [place]> moot issue at present; stable on her Coumadin via CC; leg swelling diminished on the compression hose, no salt, elevation, etc; she is back to her exercise program & back to baseline...  CHOL>  She feels much better off the Lipitor, & control is improved w/ Niacin 500mg /d via Lipid clinic...  Hypothyroid>  Stable on the Synthroid 58mcg/d...  GYN>  Stable & followed both here & at [facility] (see above)...  EHLERS-DANLOS>  Aware, stable, she has been feeling better & in great spirits...  Migraines> Hosp 6/13 w/ ?TIA but DrWillis thought likely Migraine syndrome- resolved, neg work up, no recurrence so far...  Neuro> she mentions several symtoms that she is concerned about- clumsy, balance off, subjective leg weakness- & she is concerned; therefore refer to Neurology for eva...  Depression>  On Prozac20 & improved; she & husb received counseling...  Other medical problems as noted...   Patient's Medications  New Prescriptions   MUPIROCIN  OINTMENT (BACTROBAN) 2 %    Apply topically 3 (three) times daily.  Previous Medications  ASPIRIN 81 MG TABLET    Take 81 mg by mouth daily.     CHOLECALCIFEROL (VITAMIN D) 2000 UNITS CAPS    Take 1 capsule by mouth daily.   NIACIN 500 MG TABLET    Take 500 mg by mouth at bedtime.   POLYETHYLENE GLYCOL (MIRALAX / GLYCOLAX) PACKET    Take 17 g by mouth daily.   PROBIOTIC PRODUCT (ALIGN) 4 MG CAPS    Take 1 capsule by mouth daily.   SENNA (SENOKOT) 8.6 MG TABLET    Take 1 tablet by mouth daily.  Modified Medications   Modified Medication Previous Medication   ACEBUTOLOL (SECTRAL) 200 MG CAPSULE acebutolol (SECTRAL) 200 MG capsule      TAKE 1 CAPSULE BY MOUTH ONCE DAILY    TAKE 1 CAPSULE BY MOUTH ONCE DAILY   LEVOTHYROXINE (SYNTHROID, LEVOTHROID) 75 MCG TABLET levothyroxine (SYNTHROID, LEVOTHROID) 75 MCG tablet      TAKE 1 TABLET BY MOUTH EVERY DAY    TAKE 1 TABLET BY MOUTH EVERY DAY   LORAZEPAM (ATIVAN) 1 MG TABLET LORazepam (ATIVAN) 1 MG tablet      Take 1 tablet (1 mg total) by mouth at bedtime as needed.    Take 1 tablet (1 mg total) by mouth at bedtime as needed.   WARFARIN (COUMADIN) 5 MG TABLET warfarin (COUMADIN) 5 MG tablet      Take 1-1.5 tablets (5-7.5 mg total) by mouth daily. Take 7.5mg  (1.5 tablet) daily except take 5mg  (1 tablet) on Monday and friday    Take 1-1.5 tablets (5-7.5 mg total) by mouth daily. Take 7.5mg  (1.5 tablet) daily except take 5mg  (1 tablet) on Monday and friday  Discontinued Medications   DIPHENHYDRAMINE (BENADRYL ALLERGY) 25 MG CAPSULE    Take 25 mg by mouth every 6 (six) hours as needed for itching.   DOCUSATE SODIUM (COLACE) 100 MG CAPSULE    Take 200 mg by mouth daily.   FLUOXETINE (PROZAC) 20 MG CAPSULE    Take 1 capsule (20 mg total) by mouth daily.   MOMETASONE (NASONEX) 50 MCG/ACT NASAL SPRAY    Place 2 sprays into the nose 2 (two) times daily as needed. For seasonal allergies

## 2012-06-19 ENCOUNTER — Encounter: Payer: Self-pay | Admitting: Cardiovascular Disease

## 2012-06-25 ENCOUNTER — Telehealth: Payer: Self-pay | Admitting: Neurology

## 2012-06-25 ENCOUNTER — Ambulatory Visit
Admission: RE | Admit: 2012-06-25 | Discharge: 2012-06-25 | Disposition: A | Discharge status: Home / Self Care | Payer: No Typology Code available for payment source | Source: Ambulatory Visit | Attending: Neurology | Admitting: Neurology

## 2012-06-25 DIAGNOSIS — R269 Unspecified abnormalities of gait and mobility: Secondary | ICD-10-CM

## 2012-06-25 DIAGNOSIS — Z8673 Personal history of transient ischemic attack (TIA), and cerebral infarction without residual deficits: Secondary | ICD-10-CM

## 2012-06-25 DIAGNOSIS — R531 Weakness: Secondary | ICD-10-CM

## 2012-06-25 DIAGNOSIS — R413 Other amnesia: Secondary | ICD-10-CM

## 2012-06-25 NOTE — Telephone Encounter (Signed)
I called the patient left a message. The CT scan of brain by my reading looks normal. I will call her if the formal reading is different from this. The CT of the cervical spine shows a patent spinal canal. There is some spondylosis at the C5-6 level to the left. If the patient appears to be worsening with her leg weakness, she will contact our office, and we will set her up for EMG and nerve conduction study evaluation. The blood work was unremarkable. Otherwise, I will see her back in 6 months.

## 2012-06-26 ENCOUNTER — Encounter: Payer: Self-pay | Admitting: Cardiovascular Disease

## 2012-06-26 LAB — PROTIME-INR: INR: 1.9 — AB (ref 0.9–1.1)

## 2012-06-27 ENCOUNTER — Telehealth: Payer: Self-pay | Admitting: Cardiovascular Disease

## 2012-06-27 NOTE — Telephone Encounter (Signed)
Left message for pt to call back to discuss

## 2012-06-27 NOTE — Telephone Encounter (Signed)
New Prob    Reporting Out of Range INR INR: 1.9

## 2012-06-28 NOTE — Telephone Encounter (Signed)
Spoke with patient. Her father in law just passed away so she missed 1 dose of Coumadin. She takes Coumadin 7.5mg  5 days per week and 5mg  2 days per week. Plan is to take ann extra 2.5mg  on the 5mg  day and check INR again next week on 6/11. I also called back the monitoring company and spoke with Megan at 1 [phone] and she is aware of the plan.

## 2012-06-28 NOTE — Telephone Encounter (Signed)
Left message for call back.

## 2012-07-10 ENCOUNTER — Encounter: Payer: Self-pay | Admitting: Cardiovascular Disease

## 2012-07-10 LAB — PROTIME-INR

## 2012-07-17 ENCOUNTER — Encounter: Payer: Self-pay | Admitting: Cardiovascular Disease

## 2012-07-19 ENCOUNTER — Other Ambulatory Visit: Payer: Self-pay | Admitting: *Deleted

## 2012-07-19 MED ORDER — NIACIN 500 MG PO TABS: 500.0000 mg | ORAL_TABLET | Freq: Every day | ORAL | Status: DC

## 2012-07-22 ENCOUNTER — Ambulatory Visit (INDEPENDENT_AMBULATORY_CARE_PROVIDER_SITE_OTHER): Payer: No Typology Code available for payment source | Admitting: *Deleted

## 2012-07-22 ENCOUNTER — Encounter: Payer: Self-pay | Admitting: Internal Medicine

## 2012-07-22 DIAGNOSIS — I495 Sick sinus syndrome: Secondary | ICD-10-CM

## 2012-07-22 DIAGNOSIS — Z950 Presence of cardiac pacemaker: Secondary | ICD-10-CM

## 2012-07-29 ENCOUNTER — Encounter: Payer: Self-pay | Admitting: Cardiovascular Disease

## 2012-07-31 ENCOUNTER — Encounter: Payer: Self-pay | Admitting: Cardiovascular Disease

## 2012-07-31 LAB — REMOTE PACEMAKER DEVICE
ATRIAL PACING PM: 72
BAMS-0001: 175 {beats}/min
RV LEAD THRESHOLD: 1.125 V
VENTRICULAR PACING PM: 0

## 2012-08-07 ENCOUNTER — Encounter: Payer: Self-pay | Admitting: Cardiovascular Disease

## 2012-08-07 LAB — PROTIME-INR

## 2012-08-08 ENCOUNTER — Telehealth: Payer: Self-pay | Admitting: *Deleted

## 2012-08-08 NOTE — Telephone Encounter (Signed)
Received call from home monitoring company reporting INR of 1.6 from yesterday. LM for patient to call back.

## 2012-08-14 ENCOUNTER — Telehealth: Payer: Self-pay

## 2012-08-14 ENCOUNTER — Encounter: Payer: Self-pay | Admitting: Cardiovascular Disease

## 2012-08-14 NOTE — Telephone Encounter (Signed)
I received a call from [facility] that the pt's INR today was 1.8. Dr [name] personally doses this pt's coumadin.  I left a message for the pt to call back and clarify her current coumadin dosage and make sure that she had not missed any doses.

## 2012-08-15 ENCOUNTER — Encounter: Payer: Self-pay | Admitting: *Deleted

## 2012-08-15 NOTE — Telephone Encounter (Signed)
Follow Up  Pt is calling regarding her INR.

## 2012-08-15 NOTE — Telephone Encounter (Signed)
Called patient back. She has been eating more salads. Coumadin dosing has been 7.5mg  every day except 5mg  on Mondays. Flying to [place] tomorrow. Discussed above with Dr.Nishan. He would like her to take 10mg  today of Coumadin and then 7.5mg  every day and check PT in 1 week with a goal of 2.0. Patient verbalized understanding.

## 2012-08-21 ENCOUNTER — Encounter: Payer: Self-pay | Admitting: Cardiovascular Disease

## 2012-08-21 LAB — PROTIME-INR

## 2012-08-28 ENCOUNTER — Encounter: Payer: Self-pay | Admitting: Cardiovascular Disease

## 2012-08-29 ENCOUNTER — Other Ambulatory Visit: Payer: Self-pay | Admitting: Pulmonary Disease

## 2012-09-06 ENCOUNTER — Encounter: Payer: Self-pay | Admitting: Internal Medicine

## 2012-09-10 ENCOUNTER — Encounter: Payer: Self-pay | Admitting: Cardiovascular Disease

## 2012-09-11 ENCOUNTER — Encounter: Payer: Self-pay | Admitting: Cardiovascular Disease

## 2012-09-18 ENCOUNTER — Encounter: Payer: Self-pay | Admitting: Cardiovascular Disease

## 2012-09-19 ENCOUNTER — Telehealth: Payer: Self-pay | Admitting: Cardiovascular Disease

## 2012-09-19 NOTE — Telephone Encounter (Signed)
SPOKE WITH PT HELD COUMADIN LAST  NIGHT AND HAD SALAD  AS WELL .WILL EAT ANOTHER SALAD  TODAY WILL DISCUSS  WITH DR [name] .Amy Lane

## 2012-09-19 NOTE — Telephone Encounter (Signed)
Skip today as well then take normal doses.  F/U INR in 2 weeks

## 2012-09-19 NOTE — Telephone Encounter (Signed)
PT  NOTIFIED ./CY 

## 2012-09-19 NOTE — Telephone Encounter (Signed)
New problem   Pt's INR yesterday was 3.4 and pt skipped her dosage last night. Please call pt to advise

## 2012-09-25 ENCOUNTER — Encounter: Payer: Self-pay | Admitting: Cardiovascular Disease

## 2012-09-26 ENCOUNTER — Telehealth: Payer: Self-pay | Admitting: Internal Medicine

## 2012-09-26 NOTE — Telephone Encounter (Signed)
LMTCB

## 2012-09-26 NOTE — Telephone Encounter (Signed)
New Problem  pt does INR from home///Out of range INR   Result 1.7 as of 9/3  Office has faxed this over as well.

## 2012-09-27 NOTE — Telephone Encounter (Signed)
Follow up  ° ° ° °Returning call back to nurse  °

## 2012-09-27 NOTE — Telephone Encounter (Signed)
Per Dr. Antoine Poche (DOD) [name] out til Monday) - patient to take an extra 2.5mg  Coumadin x 2 days. Patient states that she takes 7.5mg  daily and that she is agreeable to take 10mg  for next two days and resume 7.5mg  after that.

## 2012-10-02 ENCOUNTER — Encounter: Payer: Self-pay | Admitting: Cardiovascular Disease

## 2012-10-02 LAB — PROTIME-INR: INR: 3.2 — AB (ref 0.9–1.1)

## 2012-10-09 ENCOUNTER — Encounter: Payer: Self-pay | Admitting: Cardiovascular Disease

## 2012-10-09 LAB — PROTIME-INR

## 2012-10-16 ENCOUNTER — Encounter: Payer: Self-pay | Admitting: Cardiovascular Disease

## 2012-10-16 LAB — PROTIME-INR

## 2012-10-23 ENCOUNTER — Encounter: Payer: Self-pay | Admitting: Cardiovascular Disease

## 2012-10-23 LAB — PROTIME-INR

## 2012-10-30 ENCOUNTER — Other Ambulatory Visit: Payer: Self-pay | Admitting: *Deleted

## 2012-10-30 ENCOUNTER — Encounter: Payer: Self-pay | Admitting: Cardiovascular Disease

## 2012-10-30 MED ORDER — WARFARIN SODIUM 5 MG PO TABS: 5.0000 mg | ORAL_TABLET | Freq: Every day | ORAL | Status: DC

## 2012-10-30 NOTE — Telephone Encounter (Signed)
REFILL DONE ./CY 

## 2012-11-04 ENCOUNTER — Ambulatory Visit: Payer: No Typology Code available for payment source

## 2012-11-04 ENCOUNTER — Ambulatory Visit (INDEPENDENT_AMBULATORY_CARE_PROVIDER_SITE_OTHER): Payer: No Typology Code available for payment source | Admitting: *Deleted

## 2012-11-04 DIAGNOSIS — I495 Sick sinus syndrome: Secondary | ICD-10-CM

## 2012-11-04 DIAGNOSIS — Z950 Presence of cardiac pacemaker: Secondary | ICD-10-CM

## 2012-11-05 LAB — REMOTE PACEMAKER DEVICE
AL AMPLITUDE: 2.8 mv
AL IMPEDENCE PM: 442 Ohm
BATTERY VOLTAGE: 2.79 V
RV LEAD AMPLITUDE: 16 mv

## 2012-11-06 ENCOUNTER — Encounter: Payer: Self-pay | Admitting: Cardiovascular Disease

## 2012-11-11 ENCOUNTER — Telehealth: Payer: Self-pay | Admitting: Cardiovascular Disease

## 2012-11-11 NOTE — Telephone Encounter (Signed)
Follow Up ° °Pt returning call.  °

## 2012-11-11 NOTE — Telephone Encounter (Signed)
Ok to stop niacin

## 2012-11-11 NOTE — Telephone Encounter (Signed)
SPOKE WITH  PT  RE   INR  READING  OF  3.2   PER PT  HAS  CHECKED   LATER  WITH THERAPEUTIC  READINGS   ALSO WHILE WAS ON PHONE  PT   HAS STOPPED  NIACIN   AT  DENTIST REQUEST  HAS MADE  GUMS   TURN  WHITE  AND  ALSO BLISTERS APPEAR  ON  LIPS  ETC  PER PT  FEELS  BETTER  SINCE STOPPING  NIACIN  WILL   FORWARD TO   DR [name]  FOR HIS REVIEW .Amy Lane

## 2012-11-13 ENCOUNTER — Encounter: Payer: Self-pay | Admitting: *Deleted

## 2012-11-13 ENCOUNTER — Encounter: Payer: Self-pay | Admitting: Cardiovascular Disease

## 2012-11-13 LAB — PROTIME-INR

## 2012-11-14 ENCOUNTER — Telehealth: Payer: Self-pay | Admitting: *Deleted

## 2012-11-14 NOTE — Telephone Encounter (Signed)
[facility] calls with patient's INR reading  11/13/12  INR  1.9  Forwarded to Dr. [name] for review Mylo Red RN

## 2012-11-17 ENCOUNTER — Other Ambulatory Visit: Payer: Self-pay | Admitting: Pulmonary Disease

## 2012-11-18 ENCOUNTER — Telehealth: Payer: Self-pay | Admitting: Pulmonary Disease

## 2012-11-18 NOTE — Telephone Encounter (Signed)
Pt is aware. Nothing further needed 

## 2012-11-18 NOTE — Telephone Encounter (Signed)
INR ok  

## 2012-11-18 NOTE — Telephone Encounter (Signed)
I spoke with pt. She reports she is needing to get her flu shot. She is using dexamethasone elixir. On pt guide it states not to get the flu shot. Pt reports this is a mouth rinse. Please advise SN if okay to get? thanks

## 2012-11-18 NOTE — Telephone Encounter (Signed)
Per SN---  Ok to get the flu vaccine.

## 2012-11-19 ENCOUNTER — Ambulatory Visit (INDEPENDENT_AMBULATORY_CARE_PROVIDER_SITE_OTHER): Payer: No Typology Code available for payment source

## 2012-11-19 DIAGNOSIS — Z23 Encounter for immunization: Secondary | ICD-10-CM

## 2012-11-20 ENCOUNTER — Encounter: Payer: Self-pay | Admitting: Cardiovascular Disease

## 2012-11-20 LAB — PROTIME-INR: INR: 2.4 — AB (ref 0.9–1.1)

## 2012-11-21 NOTE — Telephone Encounter (Signed)
LEFT  VOICE  MAIL  RE  INR .Zack Seal

## 2012-11-27 ENCOUNTER — Encounter: Payer: Self-pay | Admitting: Cardiovascular Disease

## 2012-11-29 ENCOUNTER — Encounter: Payer: Self-pay | Admitting: Internal Medicine

## 2012-12-04 ENCOUNTER — Encounter: Payer: Self-pay | Admitting: Cardiovascular Disease

## 2012-12-05 NOTE — Telephone Encounter (Signed)
Remote cardiac service report today's (12/05/12)  pt's INR 1.9   NOTE: pt is aware that Dr. [name] ok to stop niacin.

## 2012-12-05 NOTE — Telephone Encounter (Signed)
INR ok  

## 2012-12-11 ENCOUNTER — Encounter: Payer: Self-pay | Admitting: Cardiovascular Disease

## 2012-12-11 LAB — PROTIME-INR

## 2012-12-11 NOTE — Telephone Encounter (Signed)
PT  NOTIFIED ./CY 

## 2012-12-13 ENCOUNTER — Ambulatory Visit: Payer: PRIVATE HEALTH INSURANCE | Admitting: Neurology

## 2012-12-18 ENCOUNTER — Encounter: Payer: Self-pay | Admitting: Cardiovascular Disease

## 2012-12-20 ENCOUNTER — Encounter: Payer: Self-pay | Admitting: Pulmonary Disease

## 2012-12-23 ENCOUNTER — Ambulatory Visit (HOSPITAL_COMMUNITY): Payer: No Typology Code available for payment source | Attending: Internal Medicine

## 2012-12-23 DIAGNOSIS — I6529 Occlusion and stenosis of unspecified carotid artery: Secondary | ICD-10-CM | POA: Insufficient documentation

## 2012-12-23 DIAGNOSIS — R269 Unspecified abnormalities of gait and mobility: Secondary | ICD-10-CM

## 2012-12-25 ENCOUNTER — Encounter: Payer: Self-pay | Admitting: Cardiovascular Disease

## 2012-12-26 ENCOUNTER — Telehealth: Payer: Self-pay | Admitting: *Deleted

## 2012-12-26 NOTE — Telephone Encounter (Signed)
DR [name]  REVIEWED  INR  IS  FINE PT  AWARE./CY

## 2012-12-26 NOTE — Telephone Encounter (Signed)
[facility] calling with an INR of 1.9 on 12/25/12  Will forward to Dr. [name] & Henderson Cloud RN

## 2013-01-02 ENCOUNTER — Encounter: Payer: Self-pay | Admitting: Cardiovascular Disease

## 2013-01-08 ENCOUNTER — Encounter: Payer: Self-pay | Admitting: Cardiovascular Disease

## 2013-01-08 LAB — PROTIME-INR

## 2013-01-15 ENCOUNTER — Encounter: Payer: Self-pay | Admitting: Cardiovascular Disease

## 2013-01-15 LAB — PROTIME-INR

## 2013-01-17 ENCOUNTER — Telehealth: Payer: Self-pay | Admitting: *Deleted

## 2013-01-17 NOTE — Telephone Encounter (Signed)
Received INR results from [facility].  INR was 1.9 today.  Pt taking Coumadin 7.5 mg every day except Monday she takes 5 mg.  Per Dr. Jens Som stay on same dose and forward to Dr. [name] for review.  Pt notified to continue same dose.

## 2013-01-19 ENCOUNTER — Other Ambulatory Visit: Payer: Self-pay | Admitting: Pulmonary Disease

## 2013-01-22 ENCOUNTER — Encounter: Payer: Self-pay | Admitting: Cardiovascular Disease

## 2013-01-28 ENCOUNTER — Encounter (HOSPITAL_COMMUNITY): Payer: Self-pay | Admitting: Emergency Medicine

## 2013-01-28 ENCOUNTER — Observation Stay (HOSPITAL_COMMUNITY)
Admission: EM | Admit: 2013-01-28 | Discharge: 2013-01-30 | Disposition: A | Discharge status: Home / Self Care | Payer: No Typology Code available for payment source | Attending: Internal Medicine | Admitting: Internal Medicine

## 2013-01-28 ENCOUNTER — Observation Stay (HOSPITAL_COMMUNITY): Payer: No Typology Code available for payment source

## 2013-01-28 ENCOUNTER — Ambulatory Visit (INDEPENDENT_AMBULATORY_CARE_PROVIDER_SITE_OTHER): Payer: No Typology Code available for payment source | Admitting: Cardiovascular Disease

## 2013-01-28 ENCOUNTER — Emergency Department (HOSPITAL_COMMUNITY): Payer: No Typology Code available for payment source

## 2013-01-28 VITALS — BP 140/70 | HR 60 | Resp 18

## 2013-01-28 DIAGNOSIS — I82409 Acute embolism and thrombosis of unspecified deep veins of unspecified lower extremity: Secondary | ICD-10-CM

## 2013-01-28 DIAGNOSIS — I4891 Unspecified atrial fibrillation: Secondary | ICD-10-CM

## 2013-01-28 DIAGNOSIS — F329 Major depressive disorder, single episode, unspecified: Secondary | ICD-10-CM | POA: Insufficient documentation

## 2013-01-28 DIAGNOSIS — Z950 Presence of cardiac pacemaker: Secondary | ICD-10-CM | POA: Diagnosis not present

## 2013-01-28 DIAGNOSIS — Z87891 Personal history of nicotine dependence: Secondary | ICD-10-CM | POA: Diagnosis not present

## 2013-01-28 DIAGNOSIS — E785 Hyperlipidemia, unspecified: Secondary | ICD-10-CM | POA: Insufficient documentation

## 2013-01-28 DIAGNOSIS — E039 Hypothyroidism, unspecified: Secondary | ICD-10-CM | POA: Diagnosis not present

## 2013-01-28 DIAGNOSIS — E7800 Pure hypercholesterolemia, unspecified: Secondary | ICD-10-CM

## 2013-01-28 DIAGNOSIS — R269 Unspecified abnormalities of gait and mobility: Secondary | ICD-10-CM

## 2013-01-28 DIAGNOSIS — R4182 Altered mental status, unspecified: Secondary | ICD-10-CM | POA: Insufficient documentation

## 2013-01-28 DIAGNOSIS — I471 Supraventricular tachycardia: Secondary | ICD-10-CM

## 2013-01-28 DIAGNOSIS — Q796 Ehlers-Danlos syndrome, unspecified: Secondary | ICD-10-CM | POA: Diagnosis not present

## 2013-01-28 DIAGNOSIS — F3289 Other specified depressive episodes: Secondary | ICD-10-CM | POA: Insufficient documentation

## 2013-01-28 DIAGNOSIS — I82401 Acute embolism and thrombosis of unspecified deep veins of right lower extremity: Secondary | ICD-10-CM

## 2013-01-28 DIAGNOSIS — I495 Sick sinus syndrome: Secondary | ICD-10-CM

## 2013-01-28 DIAGNOSIS — R51 Headache: Secondary | ICD-10-CM

## 2013-01-28 DIAGNOSIS — G459 Transient cerebral ischemic attack, unspecified: Principal | ICD-10-CM | POA: Insufficient documentation

## 2013-01-28 DIAGNOSIS — J309 Allergic rhinitis, unspecified: Secondary | ICD-10-CM | POA: Diagnosis not present

## 2013-01-28 DIAGNOSIS — Z7901 Long term (current) use of anticoagulants: Secondary | ICD-10-CM | POA: Insufficient documentation

## 2013-01-28 DIAGNOSIS — I059 Rheumatic mitral valve disease, unspecified: Secondary | ICD-10-CM

## 2013-01-28 DIAGNOSIS — R4789 Other speech disturbances: Secondary | ICD-10-CM

## 2013-01-28 LAB — POCT I-STAT, CHEM 8
BUN: 14 mg/dL (ref 6–23)
CALCIUM ION: 1.13 mmol/L (ref 1.13–1.30)
CHLORIDE: 101 meq/L (ref 96–112)
Creatinine, Ser: 0.8 mg/dL (ref 0.50–1.10)
Glucose, Bld: 109 mg/dL — ABNORMAL HIGH (ref 70–99)
HCT: 41 % (ref 36.0–46.0)
Hemoglobin: 13.9 g/dL (ref 12.0–15.0)
Potassium: 3.9 mEq/L (ref 3.7–5.3)
Sodium: 135 mEq/L — ABNORMAL LOW (ref 137–147)
TCO2: 22 mmol/L (ref 0–100)

## 2013-01-28 LAB — DIFFERENTIAL
Basophils Absolute: 0 10*3/uL (ref 0.0–0.1)
Basophils Relative: 0 % (ref 0–1)
EOS ABS: 0.1 10*3/uL (ref 0.0–0.7)
EOS PCT: 2 % (ref 0–5)
Lymphocytes Relative: 27 % (ref 12–46)
Lymphs Abs: 1.7 10*3/uL (ref 0.7–4.0)
MONOS PCT: 11 % (ref 3–12)
Monocytes Absolute: 0.7 10*3/uL (ref 0.1–1.0)
Neutro Abs: 3.7 10*3/uL (ref 1.7–7.7)
Neutrophils Relative %: 60 % (ref 43–77)

## 2013-01-28 LAB — ETHANOL: Alcohol, Ethyl (B): <11 mg/dL (ref 0–11)

## 2013-01-28 LAB — URINALYSIS, ROUTINE W REFLEX MICROSCOPIC
Bilirubin Urine: NEGATIVE
GLUCOSE, UA: NEGATIVE mg/dL
Hgb urine dipstick: NEGATIVE
Ketones, ur: NEGATIVE mg/dL
Nitrite: NEGATIVE
PH: 7 (ref 5.0–8.0)
Protein, ur: NEGATIVE mg/dL
Specific Gravity, Urine: 1.009 (ref 1.005–1.030)
Urobilinogen, UA: 0.2 mg/dL (ref 0.0–1.0)

## 2013-01-28 LAB — COMPREHENSIVE METABOLIC PANEL
ALT: 16 U/L (ref 0–35)
AST: 22 U/L (ref 0–37)
Albumin: 3.8 g/dL (ref 3.5–5.2)
Alkaline Phosphatase: 61 U/L (ref 39–117)
BUN: 15 mg/dL (ref 6–23)
CALCIUM: 9.7 mg/dL (ref 8.4–10.5)
CO2: 23 mEq/L (ref 19–32)
CREATININE: 0.68 mg/dL (ref 0.50–1.10)
Chloride: 98 mEq/L (ref 96–112)
GFR calc non Af Amer: >90 mL/min (ref >90)
GLUCOSE: 105 mg/dL — AB (ref 70–99)
Potassium: 4.1 mEq/L (ref 3.7–5.3)
Sodium: 135 mEq/L — ABNORMAL LOW (ref 137–147)
TOTAL PROTEIN: 7.6 g/dL (ref 6.0–8.3)
Total Bilirubin: 0.3 mg/dL (ref 0.3–1.2)

## 2013-01-28 LAB — CBC
HCT: 37.7 % (ref 36.0–46.0)
HEMOGLOBIN: 13.1 g/dL (ref 12.0–15.0)
MCH: 31.8 pg (ref 26.0–34.0)
MCHC: 34.7 g/dL (ref 30.0–36.0)
MCV: 91.5 fL (ref 78.0–100.0)
Platelets: 151 10*3/uL (ref 150–400)
RBC: 4.12 MIL/uL (ref 3.87–5.11)
RDW: 13.5 % (ref 11.5–15.5)
WBC: 6.2 10*3/uL (ref 4.0–10.5)

## 2013-01-28 LAB — RAPID URINE DRUG SCREEN, HOSP PERFORMED
Amphetamines: NOT DETECTED
Barbiturates: NOT DETECTED
Benzodiazepines: NOT DETECTED
COCAINE: NOT DETECTED
Opiates: NOT DETECTED
Tetrahydrocannabinol: NOT DETECTED

## 2013-01-28 LAB — PROTIME-INR
INR: 2.14 — ABNORMAL HIGH (ref 0.00–1.49)
PROTHROMBIN TIME: 23.2 s — AB (ref 11.6–15.2)

## 2013-01-28 LAB — TROPONIN I

## 2013-01-28 LAB — POCT I-STAT TROPONIN I: TROPONIN I, POC: 0 ng/mL (ref 0.00–0.08)

## 2013-01-28 LAB — URINE MICROSCOPIC-ADD ON

## 2013-01-28 LAB — APTT: aPTT: 40 seconds — ABNORMAL HIGH (ref 24–37)

## 2013-01-28 MED ORDER — ACEBUTOLOL HCL 200 MG PO CAPS
200.0000 mg | ORAL_CAPSULE | Freq: Every day | ORAL | Status: DC
  Administered 2013-01-28 – 2013-01-30 (×3): 200 mg via ORAL
  Filled 2013-01-28 (×3): qty 1

## 2013-01-28 MED ORDER — POLYETHYLENE GLYCOL 3350 17 G PO PACK
17.0000 g | PACK | Freq: Every day | ORAL | Status: DC
  Administered 2013-01-28 – 2013-01-30 (×3): 17 g via ORAL
  Filled 2013-01-28 (×3): qty 1

## 2013-01-28 MED ORDER — SENNOSIDES-DOCUSATE SODIUM 8.6-50 MG PO TABS: 1.0000 | ORAL_TABLET | Freq: Every evening | ORAL | Status: DC | PRN

## 2013-01-28 MED ORDER — LEVOTHYROXINE SODIUM 75 MCG PO TABS
75.0000 ug | ORAL_TABLET | Freq: Every day | ORAL | Status: DC
  Administered 2013-01-29 – 2013-01-30 (×2): 75 ug via ORAL
  Filled 2013-01-28 (×3): qty 1

## 2013-01-28 MED ORDER — FLUOXETINE HCL 20 MG PO CAPS
20.0000 mg | ORAL_CAPSULE | Freq: Every day | ORAL | Status: DC
  Filled 2013-01-28: qty 1

## 2013-01-28 MED ORDER — LORAZEPAM 1 MG PO TABS
1.0000 mg | ORAL_TABLET | Freq: Every evening | ORAL | Status: DC | PRN
Start: 2013-01-28 — End: 2013-01-30
  Administered 2013-01-29: 1 mg via ORAL
  Filled 2013-01-28: qty 1

## 2013-01-28 MED ORDER — ASPIRIN 81 MG PO TABS: 81.0000 mg | ORAL_TABLET | Freq: Every day | ORAL | Status: DC

## 2013-01-28 MED ORDER — VITAMIN D 1000 UNITS PO TABS
2000.0000 [IU] | ORAL_TABLET | Freq: Every day | ORAL | Status: DC
  Administered 2013-01-28 – 2013-01-30 (×3): 2000 [IU] via ORAL
  Filled 2013-01-28 (×3): qty 2

## 2013-01-28 MED ORDER — ASPIRIN 81 MG PO CHEW
81.0000 mg | CHEWABLE_TABLET | Freq: Every day | ORAL | Status: DC
  Administered 2013-01-28 – 2013-01-30 (×3): 81 mg via ORAL
  Filled 2013-01-28 (×3): qty 1

## 2013-01-28 MED ORDER — WARFARIN - PHYSICIAN DOSING INPATIENT: Freq: Every day | Status: DC

## 2013-01-28 MED ORDER — WARFARIN SODIUM 5 MG PO TABS: 5.0000 mg | ORAL_TABLET | ORAL | Status: DC

## 2013-01-28 MED ORDER — MUPIROCIN 2 % EX OINT
TOPICAL_OINTMENT | Freq: Three times a day (TID) | CUTANEOUS | Status: DC
  Administered 2013-01-28: 1 via TOPICAL
  Administered 2013-01-29 (×2): via TOPICAL
  Filled 2013-01-28: qty 22

## 2013-01-28 MED ORDER — SENNA 8.6 MG PO TABS
1.0000 | ORAL_TABLET | Freq: Every day | ORAL | Status: DC
  Administered 2013-01-28 – 2013-01-30 (×3): 8.6 mg via ORAL
  Filled 2013-01-28 (×3): qty 1

## 2013-01-28 MED ORDER — NIACIN 500 MG PO TABS
500.0000 mg | ORAL_TABLET | Freq: Every day | ORAL | Status: DC
  Filled 2013-01-28: qty 1

## 2013-01-28 MED ORDER — WARFARIN SODIUM 5 MG PO TABS: 5.0000 mg | ORAL_TABLET | Freq: Every day | ORAL | Status: DC

## 2013-01-28 MED ORDER — WARFARIN SODIUM 7.5 MG PO TABS
7.5000 mg | ORAL_TABLET | ORAL | Status: DC
  Administered 2013-01-28 – 2013-01-29 (×2): 7.5 mg via ORAL
  Filled 2013-01-28 (×3): qty 1

## 2013-01-28 NOTE — Assessment & Plan Note (Signed)
S/P repair without significant residual MR  SBE prophylaxis

## 2013-01-28 NOTE — Progress Notes (Signed)
Amy Lane is seen as an emergent add on  History  sinus bradycardia status post pacemaker, status post generator replacement and lead repair March 2011.  She also has a history of Ehlers-Danlos syndrome in the context of a marfanoid habitus, mitral valve prolapse status post mitral valve repair at the [facility] clinic with prior TIA on chronic Coumadin as well as syncope due to diagnosis of long QT syndrome. SHe has had significant dysautonomic symptoms consistent with POTS S/P left CEA with normal duplex 11/11   Went to [place] last year and got Superficial phlebitis with elevated INR and bleeding with RLE swelling. No DVT or compartment syndrome. Reviewed numerous notes from [place] which were hard to decipher. INR was elevated at 4.3. I suspect she had a muscle strain from all the walking and bled into it.  Cholesterol elevated Saw lipid clinic but did not tolerate crestor and previously intolerant to lipitor. Suspect Zetia and red yeast rice will be recommended on    She sees Dr Jannifer Franklin for neuro  Has had migraines and some episodes of poor mentation In October of 2012 has brief paralysis of 4 extremities with normal MRI and CT  Pt brought into the office by her husband with dizziness, visual changes and mental confusion for one hour.  The pt is having difficulty with word and thought process.  The pt does not know the day of week but does know the president and current location. No extremity weakness.  Her INR was Rx at 2.3 two weeks ago and 1.9 a week ago.  Denies other drugs or ETOH      ROS: Denies fever, malais, weight loss, blurry vision, decreased visual acuity, cough, sputum, SOB, hemoptysis, pleuritic pain, palpitaitons, heartburn, abdominal pain, melena, lower extremity edema, claudication, or rash.  All other systems reviewed and negative  General: Affect appropriate Healthy:  appears stated age 63: normal Neck supple with no adenopathy JVP normal no bruits no  thyromegaly Lungs clear with no wheezing and good diaphragmatic motion Heart:  S1/S2 no murmur, no rub, gallop or click PMI normal Abdomen: benighn, BS positve, no tenderness, no AAA no bruit.  No HSM or HJR Distal pulses intact with no bruits No edema Neuro non-focal Skin warm and dry No muscular weakness   Current Outpatient Prescriptions  Medication Sig Dispense Refill  . acebutolol (SECTRAL) 200 MG capsule TAKE 1 CAPSULE BY MOUTH ONCE DAILY  90 capsule  3  . aspirin 81 MG tablet Take 81 mg by mouth daily.        . Cholecalciferol (VITAMIN D) 2000 UNITS CAPS Take 1 capsule by mouth daily.      Marland Kitchen FLUoxetine (PROZAC) 20 MG capsule TAKE 1 CAPSULE (20 MG TOTAL) BY MOUTH DAILY.  90 capsule  1  . levothyroxine (SYNTHROID, LEVOTHROID) 75 MCG tablet TAKE 1 TABLET BY MOUTH EVERY DAY  90 tablet  3  . LORazepam (ATIVAN) 1 MG tablet TAKE 1 TABLET AT BEDTIME AS NEEDED FOR SLEEP  30 tablet  1  . mupirocin ointment (BACTROBAN) 2 % Apply topically 3 (three) times daily.  22 g  0  . niacin 500 MG tablet Take 1 tablet (500 mg total) by mouth at bedtime.  90 tablet  1  . polyethylene glycol (MIRALAX / GLYCOLAX) packet Take 17 g by mouth daily.      . Probiotic Product (ALIGN) 4 MG CAPS Take 1 capsule by mouth daily.      Marland Kitchen senna (SENOKOT) 8.6 MG tablet  Take 1 tablet by mouth daily.      Marland Kitchen warfarin (COUMADIN) 5 MG tablet Take 1-1.5 tablets (5-7.5 mg total) by mouth daily. Take 7.5mg  (1.5 tablet) daily except take 5mg  (1 tablet) on Monday and friday  45 tablet  4   No current facility-administered medications for this visit.    Allergies  Other and Statins  Electrocardiogram:  SR RBBB   Assessment and Plan

## 2013-01-28 NOTE — ED Notes (Signed)
NOTIFIEDE DR . Eulis Foster OF PATIENTS LAB RESULTS IN PERSON @17 :25 PM ,01/28/2013.

## 2013-01-28 NOTE — Consult Note (Signed)
Referring Physician: ED    Chief Complaint: CODE STROKE: LANGUAGE IMPAIRMENT, HA, CONFUSION  HPI:                                                                                                                                         Amy Lane is an 63 y.o. female, tight handed, with a past medical history significant for hypercholesterolemia, atrial fibrillation, PVSVT, SVT, PAT, s/p pacemaker placement, s/p left CEA, s/p cardiac surgery, Ehler's Danlos syndrome, migraine, TIA, stroke without residual deficits, brought in as a code stroke due to the above mentioned symptoms Last known well at 315 pm today. She was riding in the car with her husband when developed a sudden, severe HA with nausea and then  Became confused and couldn't talk. No associated vertigo, double vision, focal weakness or numbness or language disturbance. Stated that the episode lasted for about 30 minutes and completely resolved. Initial NIHSS 1. CT brain showed no acute abnormality. She is on coumadin and INR is pending. At this moment she is asymptomatic from a neurological standpoint.  Date last known well: 01/28/13 Time last known well: 315 pm tPA Given: no, symptoms resolved NIHSS: 1  MRS: 0  Past Medical History  Diagnosis Date  . Herpes zoster   . SVT (supraventricular tachycardia)   . PSVT (paroxysmal supraventricular tachycardia)   . PAT (paroxysmal atrial tachycardia)   . History of tachycardia-bradycardia syndrome   . Atrial fibrillation   . Dysautonomia   . History of syncope   . Cardiac pacemaker     DDD  MDT  . Long term (current) use of anticoagulants   . Allergic rhinitis   . Mitral valve prolapse   . Cerebrovascular disease   . Hypercholesteremia   . Hypothyroidism   . Hemorrhoids   . Ehler's-Danlos syndrome   . Fibromyalgia   . History of transient ischemic attack   . Depression   . Carcinomas, basal cell   . Skin desquamation     inflammative vaginitis  . Abnormal gait   .  Hypercholesterolemia   . Headache(784.0)     Migraine    Past Surgical History  Procedure Laterality Date  . Complex mitral valve repair      at the [facility]  . Left carotid endarterectomy  11/2003    by Dr. Delton See  . Pacemaker insertion      medtronic kappa 901  . Removed chalazion from left eye lid  09/2011    Dr. Satira Sark    Family History  Problem Relation Age of Onset  . Cancer      family history of  . Coronary artery disease      family history of  . Leukemia      Nephew  . Lung cancer Mother   . Prostate cancer Father   . Alzheimer's disease Father   . Heart disease Father   .  Mitral valve prolapse Brother   . Diabetes Sister   . Mitral valve prolapse Sister    Social History:  reports that she has quit smoking. Her smoking use included Cigarettes. She smoked 0.00 packs per day. She has never used smokeless tobacco. She reports that she drinks alcohol. She reports that she does not use illicit drugs.  Allergies:  Allergies  Allergen Reactions  . Other     Allergic to all QT drugs---  . Statins     myalgias    Medications:                                                                                                                           I have reviewed the patient's current medications.  ROS:                                                                                                                                       History obtained from patient and EMS.  General ROS: negative for - chills, fatigue, fever, night sweats, weight gain or weight loss Psychological ROS: negative for - behavioral disorder, hallucinations, memory difficulties, mood swings or suicidal ideation Ophthalmic ROS: negative for - blurry vision, double vision, eye pain or loss of vision ENT ROS: negative for - epistaxis, nasal discharge, oral lesions, sore throat, tinnitus or vertigo Allergy and Immunology ROS: negative for - hives or itchy/watery eyes Hematological  and Lymphatic ROS: negative for - bleeding problems, bruising or swollen lymph nodes Endocrine ROS: negative for - galactorrhea, hair pattern changes, polydipsia/polyuria or temperature intolerance Respiratory ROS: negative for - cough, hemoptysis, shortness of breath or wheezing Cardiovascular ROS: negative for - chest pain, dyspnea on exertion, edema or irregular heartbeat Gastrointestinal ROS: negative for - abdominal pain, diarrhea, hematemesis, nausea/vomiting or stool incontinence Genito-Urinary ROS: negative for - dysuria, hematuria, incontinence or urinary frequency/urgency Musculoskeletal ROS: negative for - joint swelling or muscular weakness Neurological ROS: as noted in HPI Dermatological ROS: negative for rash and skin lesion changes    Physical exam: pleasant female in no apparent distress. Blood pressure 117/70, temperature 97.9 F (36.6 C), temperature source Oral, resp. rate 14, SpO2 100.00%. Head: normocephalic. Neck: supple, no bruits, no JVD. Cardiac: no murmurs. Lungs: clear. Abdomen: soft, no tender, no mass. Extremities: no edema.  Neurologic Examination:  Mental Status: Alert, oriented, thought content appropriate.  Speech fluent without evidence of aphasia.  Able to follow 3 step commands without difficulty. Cranial Nerves: II: Discs flat bilaterally; Visual fields grossly normal, pupils equal, round, reactive to light and accommodation III,IV, VI: ptosis not present, extra-ocular motions intact bilaterally V,VII: smile symmetric, facial light touch sensation normal bilaterally VIII: hearing normal bilaterally IX,X: gag reflex present XI: bilateral shoulder shrug XII: midline tongue extension without atrophy or fasciculations  Motor: Right : Upper extremity   5/5    Left:     Upper extremity   5/5  Lower extremity   5/5     Lower extremity   5/5 Tone and  bulk:normal tone throughout; no atrophy noted Sensory: Pinprick and light touch diminished in the left side. Deep Tendon Reflexes:  Right: Upper Extremity   Left: Upper extremity   biceps (C-5 to C-6) 2/4   biceps (C-5 to C-6) 2/4 tricep (C7) 2/4    triceps (C7) 2/4 Brachioradialis (C6) 2/4  Brachioradialis (C6) 2/4  Lower Extremity Lower Extremity  quadriceps (L-2 to L-4) 2/4   quadriceps (L-2 to L-4) 2/4 Achilles (S1) 2/4   Achilles (S1) 2/4  Plantars: Right: downgoing   Left: downgoing Cerebellar: normal finger-to-nose,  normal heel-to-shin test Gait:  No tested. CV: pulses palpable throughout      Results for orders placed during the hospital encounter of 01/28/13 (from the past 48 hour(s))  POCT I-STAT TROPONIN I     Status: None   Collection Time    01/28/13  5:15 PM      Result Value Range   Troponin i, poc 0.00  0.00 - 0.08 ng/mL   Comment 3            Comment: Due to the release kinetics of cTnI,     a negative result within the first hours     of the onset of symptoms does not rule out     myocardial infarction with certainty.     If myocardial infarction is still suspected,     repeat the test at appropriate intervals.  POCT I-STAT, CHEM 8     Status: Abnormal   Collection Time    01/28/13  5:17 PM      Result Value Range   Sodium 135 (*) 137 - 147 mEq/L   Potassium 3.9  3.7 - 5.3 mEq/L   Chloride 101  96 - 112 mEq/L   BUN 14  6 - 23 mg/dL   Creatinine, Ser 0.80  0.50 - 1.10 mg/dL   Glucose, Bld 109 (*) 70 - 99 mg/dL   Calcium, Ion 1.13  1.13 - 1.30 mmol/L   TCO2 22  0 - 100 mmol/L   Hemoglobin 13.9  12.0 - 15.0 g/dL   HCT 41.0  36.0 - 46.0 %   Ct Head Wo Contrast  01/28/2013   CLINICAL DATA:  Dysphasia  EXAM: CT HEAD WITHOUT CONTRAST  TECHNIQUE: Contiguous axial images were obtained from the base of the skull through the vertex without intravenous contrast. Study was obtained within 24 hr of patient's arrival at the emergency department.  COMPARISON:   June 25, 2012  FINDINGS: The ventricles are normal in size and configuration. There is no mass, hemorrhage, extra-axial fluid collection, or midline shift. The gray-white compartments are normal. No acute infarct seen. Bony calvarium appears intact. The mastoid air cells are clear. There is stable calcification in the pineal gland.  IMPRESSION: No focal gray-white compartment lesions. No  acute infarct apparent. No hemorrhage or mass effect.  Critical Value/emergent results were called by telephone at the time of interpretation on 01/28/2013 at 5:20 PM to Dr. Dorian Pod , who verbally acknowledged these results.   Electronically Signed   By: Lowella Grip M.D.   On: 01/28/2013 17:20     Assessment: 63 y.o. female with transient confusion and language impairment in the context of a severe HA earlier today. She feels that she is now back to her baseline. NIHSS 1 (sensory), and CT brain normal. Can not have MRI due to pacemaker. Most likely complicated migraine but she has risk factors for stroke and can not entirely exclude TIA. Will recommend admission to medicine for observation and TIA work up. Ensure that INR is therapeutic. Will follow up in am.  Stroke Risk Factors - hypercholesterolemia, atrial fibrillation, stroke.    Dorian Pod, MD [facility] [phone]  01/28/2013, 5:39 PM

## 2013-01-28 NOTE — Assessment & Plan Note (Signed)
Normal pacer function f.u Dr Caryl Comes

## 2013-01-28 NOTE — Progress Notes (Signed)
Pt arrived to unit 2000 by [facility] nurse tech. Pt oriented to room, call bell in place and bed alarm on. Will continue to monitor.

## 2013-01-28 NOTE — Progress Notes (Signed)
Patient ID: Amy Lane, female   DOB: Oct 14, 1950, 63 y.o.   MRN: [id number]

## 2013-01-28 NOTE — ED Notes (Signed)
Pt from [place]. Per EMS, pt was in vehicle with husband, c/o headache and slurred speech. Per EMS, no signs or symptoms at there arrival. NSD. Pt alert.

## 2013-01-28 NOTE — ED Notes (Signed)
Code Stroke called at 1650 patient arrived with [facility].  Per patient she and her husband were driving this afternoon about 1515 when she suddenly had difficulty speaking/finding words.  Symptoms have resolved upon arrival to ED.  Stat head CT done, Labs drawn.  NIHSS 1, mild sensory loss.  Husband at bedside.  RN to call if assistance needed.

## 2013-01-28 NOTE — Assessment & Plan Note (Signed)
Stable rhythm with no PAF  Or rapid palpitations

## 2013-01-28 NOTE — ED Provider Notes (Signed)
CSN: [id number]     Arrival date & time 01/28/13  1707 History   First MD Initiated Contact with Patient 01/28/13 1735     Chief Complaint  Patient presents with  . Code Stroke   (Consider location/radiation/quality/duration/timing/severity/associated sxs/prior Treatment) Patient is a 63 y.o. female presenting with neurologic complaint. The history is provided by the patient (husband).  Neurologic Problem This is a new problem. The current episode started 1 to 2 hours ago. The problem occurs constantly. The problem has been rapidly improving. Associated symptoms include headaches. Pertinent negatives include no chest pain, no abdominal pain and no shortness of breath. Nothing aggravates the symptoms. Nothing relieves the symptoms. She has tried nothing for the symptoms. The treatment provided significant relief.    Past Medical History  Diagnosis Date  . Herpes zoster   . SVT (supraventricular tachycardia)   . PSVT (paroxysmal supraventricular tachycardia)   . PAT (paroxysmal atrial tachycardia)   . History of tachycardia-bradycardia syndrome   . Atrial fibrillation   . Dysautonomia   . History of syncope   . Cardiac pacemaker     DDD  MDT  . Long term (current) use of anticoagulants   . Allergic rhinitis   . Mitral valve prolapse   . Cerebrovascular disease   . Hypercholesteremia   . Hypothyroidism   . Hemorrhoids   . Ehler's-Danlos syndrome   . Fibromyalgia   . History of transient ischemic attack   . Depression   . Carcinomas, basal cell   . Skin desquamation     inflammative vaginitis  . Abnormal gait   . Hypercholesterolemia   . Headache(784.0)     Migraine   Past Surgical History  Procedure Laterality Date  . Complex mitral valve repair      at the [facility]  . Left carotid endarterectomy  11/2003    by Dr. Delton See  . Pacemaker insertion      medtronic kappa 901  . Removed chalazion from left eye lid  09/2011    Dr. Satira Sark   Family History  Problem  Relation Age of Onset  . Cancer      family history of  . Coronary artery disease      family history of  . Leukemia      Nephew  . Lung cancer Mother   . Prostate cancer Father   . Alzheimer's disease Father   . Heart disease Father   . Mitral valve prolapse Brother   . Diabetes Sister   . Mitral valve prolapse Sister    History  Substance Use Topics  . Smoking status: Former Smoker    Types: Cigarettes  . Smokeless tobacco: Never Used     Comment: smoked in her early 20's--smoked 4 cigs per year   . Alcohol Use: Yes     Comment: Consumes alcohol on occasion   OB History   Grav Para Term Preterm Abortions TAB SAB Ect Mult Living                 Review of Systems  Constitutional: Negative for fever and fatigue.  HENT: Negative for congestion and drooling.   Eyes: Negative for pain.  Respiratory: Negative for cough and shortness of breath.   Cardiovascular: Negative for chest pain.  Gastrointestinal: Negative for nausea, vomiting, abdominal pain and diarrhea.  Genitourinary: Negative for dysuria and hematuria.  Musculoskeletal: Negative for back pain, gait problem and neck pain.  Skin: Negative for color change.  Neurological: Positive for headaches.  Negative for dizziness.       Photophobia  Hematological: Negative for adenopathy.  Psychiatric/Behavioral: Negative for behavioral problems.  All other systems reviewed and are negative.    Allergies  Other and Statins  Home Medications   Current Outpatient Rx  Name  Route  Sig  Dispense  Refill  . acebutolol (SECTRAL) 200 MG capsule      TAKE 1 CAPSULE BY MOUTH ONCE DAILY   90 capsule   3   . aspirin 81 MG tablet   Oral   Take 81 mg by mouth daily.           . Cholecalciferol (VITAMIN D) 2000 UNITS CAPS   Oral   Take 1 capsule by mouth daily.         Marland Kitchen FLUoxetine (PROZAC) 20 MG capsule      TAKE 1 CAPSULE (20 MG TOTAL) BY MOUTH DAILY.   90 capsule   1   . levothyroxine (SYNTHROID, LEVOTHROID)  75 MCG tablet      TAKE 1 TABLET BY MOUTH EVERY DAY   90 tablet   3   . LORazepam (ATIVAN) 1 MG tablet      TAKE 1 TABLET AT BEDTIME AS NEEDED FOR SLEEP   30 tablet   1   . mupirocin ointment (BACTROBAN) 2 %   Topical   Apply topically 3 (three) times daily.   22 g   0   . niacin 500 MG tablet   Oral   Take 1 tablet (500 mg total) by mouth at bedtime.   90 tablet   1   . polyethylene glycol (MIRALAX / GLYCOLAX) packet   Oral   Take 17 g by mouth daily.         . Probiotic Product (ALIGN) 4 MG CAPS   Oral   Take 1 capsule by mouth daily.         Marland Kitchen senna (SENOKOT) 8.6 MG tablet   Oral   Take 1 tablet by mouth daily.         Marland Kitchen warfarin (COUMADIN) 5 MG tablet   Oral   Take 1-1.5 tablets (5-7.5 mg total) by mouth daily. Take 7.5mg  (1.5 tablet) daily except take 5mg  (1 tablet) on Monday and friday   45 tablet   4    BP 112/60  Temp(Src) 97.9 F (36.6 C) (Oral)  Resp 14  SpO2 100% Physical Exam  Nursing note and vitals reviewed. Constitutional: She is oriented to person, place, and time. She appears well-developed and well-nourished.  HENT:  Head: Normocephalic.  Mouth/Throat: Oropharynx is clear and moist. No oropharyngeal exudate.  Eyes: Conjunctivae and EOM are normal. Pupils are equal, round, and reactive to light.  Neck: Normal range of motion. Neck supple.  Cardiovascular: Normal rate, regular rhythm, normal heart sounds and intact distal pulses.  Exam reveals no gallop and no friction rub.   No murmur heard. Pulmonary/Chest: Effort normal and breath sounds normal. No respiratory distress. She has no wheezes.  Abdominal: Soft. Bowel sounds are normal. There is no tenderness. There is no rebound and no guarding.  Musculoskeletal: Normal range of motion. She exhibits no edema and no tenderness.  Neurological: She is alert and oriented to person, place, and time. She has normal strength. No cranial nerve deficit or sensory deficit. She displays a  negative Romberg sign. Coordination and gait normal.  Normal speech and understanding.  Normal finger to nose bilaterally.  The patient is able to walk forwards and backwards  without difficulty.  Skin: Skin is warm and dry.  Psychiatric: She has a normal mood and affect. Her behavior is normal.    ED Course  Procedures (including critical care time) Labs Review Labs Reviewed  PROTIME-INR - Abnormal; Notable for the following:    Prothrombin Time 23.2 (*)    INR 2.14 (*)    All other components within normal limits  APTT - Abnormal; Notable for the following:    aPTT 40 (*)    All other components within normal limits  COMPREHENSIVE METABOLIC PANEL - Abnormal; Notable for the following:    Sodium 135 (*)    Glucose, Bld 105 (*)    All other components within normal limits  URINALYSIS, ROUTINE W REFLEX MICROSCOPIC - Abnormal; Notable for the following:    Leukocytes, UA SMALL (*)    All other components within normal limits  LIPID PANEL - Abnormal; Notable for the following:    Cholesterol 235 (*)    LDL Cholesterol 155 (*)    All other components within normal limits  PROTIME-INR - Abnormal; Notable for the following:    Prothrombin Time 22.1 (*)    INR 2.00 (*)    All other components within normal limits  POCT I-STAT, CHEM 8 - Abnormal; Notable for the following:    Sodium 135 (*)    Glucose, Bld 109 (*)    All other components within normal limits  URINE CULTURE  ETHANOL  CBC  DIFFERENTIAL  TROPONIN I  URINE RAPID DRUG SCREEN (HOSP PERFORMED)  URINE MICROSCOPIC-ADD ON  HEMOGLOBIN A1C  POCT I-STAT TROPONIN I   Imaging Review Ct Head Wo Contrast  01/28/2013   CLINICAL DATA:  Dysphasia  EXAM: CT HEAD WITHOUT CONTRAST  TECHNIQUE: Contiguous axial images were obtained from the base of the skull through the vertex without intravenous contrast. Study was obtained within 24 hr of patient's arrival at the emergency department.  COMPARISON:  June 25, 2012  FINDINGS: The  ventricles are normal in size and configuration. There is no mass, hemorrhage, extra-axial fluid collection, or midline shift. The gray-white compartments are normal. No acute infarct seen. Bony calvarium appears intact. The mastoid air cells are clear. There is stable calcification in the pineal gland.  IMPRESSION: No focal gray-white compartment lesions. No acute infarct apparent. No hemorrhage or mass effect.  Critical Value/emergent results were called by telephone at the time of interpretation on 01/28/2013 at 5:20 PM to Dr. Dorian Pod , who verbally acknowledged these results.   Electronically Signed   By: Lowella Grip M.D.   On: 01/28/2013 17:20    EKG Interpretation    Date/Time:  Tuesday January 28 2013 17:27:21 EST Ventricular Rate:  80 PR Interval:  221 QRS Duration: 107 QT Interval:  440 QTC Calculation: 508 R Axis:   130 Text Interpretation:  sinus  rhythm, irregular rate Prolonged PR interval Right axis deviation RSR' in V1 or V2, probably normal variant Borderline prolonged QT interval No significant change since last tracing Confirmed by [place]  MD, Cathie Bonnell ([id number]) on 01/28/2013 6:27:34 PM            MDM   1. TIA (transient ischemic attack)   2. Abnormality of gait   3. Atrial fibrillation    6:24 PM 63 y.o. female who presented with sudden onset headache, photophobia, and expressive aphasia which began while she was riding in a car at approximately 3:30 PM. Her husband states that her symptoms lasted approximately 45 minutes and spontaneously began  resolving. She is asymptomatic on my exam now. She presented as a code stroke. She has a history of similar episodes. She has been evaluated by neurology who suspects TIA versus complicated migraine. Her vital signs are unremarkable here. Neurology recommends admission for observation.    Blanchard Kelch, MD 01/29/13 [phone]

## 2013-01-28 NOTE — Progress Notes (Signed)
Received report from [facility] in ED at 1947. Room ready and waiting on arrival of pt.

## 2013-01-28 NOTE — Assessment & Plan Note (Signed)
Resolved by duplex  On chronic coumadin for PAF and previous TIA as well

## 2013-01-28 NOTE — Assessment & Plan Note (Signed)
No focal defects.  Husband denies seizure activity No headache with history of migraines.  Doubt embolic event as coumadin has been fairly stable.  Have contacted ER and called code stroke.  Best w/u would involve transport to ER for head CT to r/o stroke and bleed on coumadin Should also have tox screen and BS checked  ER evaluation by neurology.  EMS on seen for transport.  Amy Lane also seems to have some LE muscle weakness and I wonder if she has some more generalized progressive cognitive / wasting disorder.

## 2013-01-28 NOTE — Assessment & Plan Note (Signed)
Cholesterol is at goal.  Continue current dose of statin and diet Rx.  No myalgias or side effects.  F/U  LFT's in 6 months. Lab Results  Component Value Date   LDLCALC 112* 03/21/2011

## 2013-01-28 NOTE — Assessment & Plan Note (Signed)
Maint NSR the fact that she is in NSR makes embolic event less likely check INR

## 2013-01-28 NOTE — H&P (Signed)
[facility]                                                                                    Patient Demographics  Amy Lane, is a 63 y.o. female  CSN: [id number]  MRN: [id number]  DOB - 08-17-1950  Admit Date - 01/28/2013  Outpatient Primary MD for the patient is Noralee Space, MD   With History of -  Past Medical History  Diagnosis Date  . Herpes zoster   . SVT (supraventricular tachycardia)   . PSVT (paroxysmal supraventricular tachycardia)   . PAT (paroxysmal atrial tachycardia)   . History of tachycardia-bradycardia syndrome   . Atrial fibrillation   . Dysautonomia   . History of syncope   . Cardiac pacemaker     DDD  MDT  . Long term (current) use of anticoagulants   . Allergic rhinitis   . Mitral valve prolapse   . Cerebrovascular disease   . Hypercholesteremia   . Hypothyroidism   . Hemorrhoids   . Ehler's-Danlos syndrome   . Fibromyalgia   . History of transient ischemic attack   . Depression   . Carcinomas, basal cell   . Skin desquamation     inflammative vaginitis  . Abnormal gait   . Hypercholesterolemia   . Headache(784.0)     Migraine      Past Surgical History  Procedure Laterality Date  . Complex mitral valve repair      at the [facility]  . Left carotid endarterectomy  11/2003    by Dr. Delton See  . Pacemaker insertion      medtronic kappa 901  . Removed chalazion from left eye lid  09/2011    Dr. Satira Sark    in for   Chief Complaint  Patient presents with  . Code Stroke     HPI  Amy Lane  is a 63 y.o. female, with a complicated past medical history consisting of mitral valve prolapse status post surgery in the past, Ehlers-Danlos syndrome and previous TIA and CVA presenting with 45 minutes episode of headache and altered mental status that happened while in the car with her husband. No slurring of speech no visual hallucinations diplopia or blurred vision, no chest pains or shortness of breath. Code  stroke was called and CT of the head was negative. Patient admitted for observation.  Her history is also complicated by PSVT, PAT and atrial fib on Coumadin. In addition to sick sinus syndrome status post pacemaker placement in the past and left carotid endarterectomy. Upon arrival to the emergency room , her symptoms have resolved and the patient is back to baseline.     Review of Systems    In addition to the HPI above,  No Fever-chills, No Headache, No changes with Vision or hearing, No problems swallowing food or Liquids, No Chest pain, Cough or Shortness of Breath, No Abdominal pain, No Nausea or Vommitting, Bowel movements are regular, No Blood in stool or Urine, No dysuria, No new skin rashes or bruises, No new joints pains-aches,  No new weakness, tingling, numbness in any extremity, No significant Mental Stressors.  A full 10 point Review of  Systems was done, except as stated above, all other Review of Systems were negative.   Social History History  Substance Use Topics  . Smoking status: Former Smoker    Types: Cigarettes  . Smokeless tobacco: Never Used     Comment: smoked in her early 20's--smoked 4 cigs per year   . Alcohol Use: Yes     Comment: Consumes alcohol on occasion     Family History Family History  Problem Relation Age of Onset  . Cancer      family history of  . Coronary artery disease      family history of  . Leukemia      Nephew  . Lung cancer Mother   . Prostate cancer Father   . Alzheimer's disease Father   . Heart disease Father   . Mitral valve prolapse Brother   . Diabetes Sister   . Mitral valve prolapse Sister      Prior to Admission medications   Medication Sig Start Date End Date Taking? Authorizing Provider  acebutolol (SECTRAL) 200 MG capsule TAKE 1 CAPSULE BY MOUTH ONCE DAILY 06/05/12   Josue Hector, MD  aspirin 81 MG tablet Take 81 mg by mouth daily.      Historical Provider, MD  Cholecalciferol (VITAMIN D) 2000  UNITS CAPS Take 1 capsule by mouth daily.    Historical Provider, MD  FLUoxetine (PROZAC) 20 MG capsule TAKE 1 CAPSULE (20 MG TOTAL) BY MOUTH DAILY. 08/29/12   Noralee Space, MD  levothyroxine (SYNTHROID, LEVOTHROID) 75 MCG tablet TAKE 1 TABLET BY MOUTH EVERY DAY 05/21/12   Noralee Space, MD  LORazepam (ATIVAN) 1 MG tablet TAKE 1 TABLET AT BEDTIME AS NEEDED FOR SLEEP 01/19/13   Noralee Space, MD  mupirocin ointment (BACTROBAN) 2 % Apply topically 3 (three) times daily. 06/05/12   Adlih Moreno-Coll, MD  niacin 500 MG tablet Take 1 tablet (500 mg total) by mouth at bedtime. 07/19/12   Deboraha Sprang, MD  polyethylene glycol [facility]avel[facility]) packet Take 17 g by mouth daily.    Historical Provider, MD  Probiotic Product (ALIGN) 4 MG CAPS Take 1 capsule by mouth daily.    Historical Provider, MD  senna (SENOKOT) 8.6 MG tablet Take 1 tablet by mouth daily.    Historical Provider, MD  warfarin (COUMADIN) 5 MG tablet Take 1-1.5 tablets (5-7.5 mg total) by mouth daily. Take 7.5mg  (1.5 tablet) daily except take 5mg  (1 tablet) on Monday and friday 10/30/12   Josue Hector, MD    Allergies  Allergen Reactions  . Other     Allergic to all QT drugs---  . Statins     myalgias    Physical Exam  Vitals  Blood pressure 130/64, pulse 55, temperature 97.6 F (36.4 C), temperature source Oral, resp. rate 18, SpO2 99.00%.   1. General A. very pleasant white American female in no acute distress.  2. Normal affect and insight, Not Suicidal or Homicidal, Awake Alert, Oriented X 3.  3. No F.N deficits, ALL C.Nerves Intact, Strength 5/5 all 4 extremities, Sensation intact all 4 extremities, Plantars down going.  4. Ears and Eyes appear Normal, Conjunctivae clear, PERRLA. Moist Oral Mucosa.  5. Supple Neck, No JVD, No cervical lymphadenopathy appriciated, No Carotid Bruits.  6. Symmetrical Chest wall movement, Good air movement bilaterally, CTAB.  7. RRR, No Gallops, Rubs or Murmurs, No Parasternal  Heave.  8. Positive Bowel Sounds, Abdomen Soft, Non tender, No organomegaly appriciated,No rebound -guarding  or rigidity.  9.  No Cyanosis, Normal Skin Turgor, No Skin Rash or Bruise.  10. Good muscle tone,  joints appear normal , no effusions, Normal ROM.  11. No Palpable Lymph Nodes in Neck or Axillae    Data Review  CBC  Recent Labs Lab 01/28/13 1708 01/28/13 1717  WBC 6.2  --   HGB 13.1 13.9  HCT 37.7 41.0  PLT 151  --   MCV 91.5  --   MCH 31.8  --   MCHC 34.7  --   RDW 13.5  --   LYMPHSABS 1.7  --   MONOABS 0.7  --   EOSABS 0.1  --   BASOSABS 0.0  --    ------------------------------------------------------------------------------------------------------------------  Chemistries   Recent Labs Lab 01/28/13 1708 01/28/13 1717  NA 135* 135*  K 4.1 3.9  CL 98 101  CO2 23  --   GLUCOSE 105* 109*  BUN 15 14  CREATININE 0.68 0.80  CALCIUM 9.7  --   AST 22  --   ALT 16  --   ALKPHOS 61  --   BILITOT 0.3  --    ------------------------------------------------------------------------------------------------------------------  Coagulation profile  Recent Labs Lab 01/28/13 1708  INR 2.14*    Cardiac Enzymes  Recent Labs Lab 01/28/13 1708  TROPONINI <0.30    ---------------------------------------------------------------------------------------------------------------  Urinalysis    Component Value Date/Time   COLORURINE YELLOW 01/28/2013 1936   APPEARANCEUR CLEAR 01/28/2013 1936   LABSPEC 1.009 01/28/2013 1936   PHURINE 7.0 01/28/2013 1936   GLUCOSEU NEGATIVE 01/28/2013 1936   HGBUR NEGATIVE 01/28/2013 1936   BILIRUBINUR NEGATIVE 01/28/2013 1936   KETONESUR NEGATIVE 01/28/2013 1936   PROTEINUR NEGATIVE 01/28/2013 1936   UROBILINOGEN 0.2 01/28/2013 1936   NITRITE NEGATIVE 01/28/2013 1936   LEUKOCYTESUR SMALL* 01/28/2013 1936    ----------------------------------------------------------------------------------------------------------------    Imaging  results:   Ct Head Wo Contrast  01/28/2013   CLINICAL DATA:  Dysphasia  EXAM: CT HEAD WITHOUT CONTRAST  TECHNIQUE: Contiguous axial images were obtained from the base of the skull through the vertex without intravenous contrast. Study was obtained within 24 hr of patient's arrival at the emergency department.  COMPARISON:  June 25, 2012  FINDINGS: The ventricles are normal in size and configuration. There is no mass, hemorrhage, extra-axial fluid collection, or midline shift. The gray-white compartments are normal. No acute infarct seen. Bony calvarium appears intact. The mastoid air cells are clear. There is stable calcification in the pineal gland.  IMPRESSION: No focal gray-white compartment lesions. No acute infarct apparent. No hemorrhage or mass effect.  Critical Value/emergent results were called by telephone at the time of interpretation on 01/28/2013 at 5:20 PM to Dr. Dorian Pod , who verbally acknowledged these results.   Electronically Signed   By: Lowella Grip M.D.   On: 01/28/2013 17:20    My personal review of EKG: Still pending    Assessment & Plan  1. TIA versus complicated migraine; patient is back to baseline neurologically    CT of the head is negative    Patient placed on aspirin , neurochecks every 2 hours ,    Neurology is following  2. history of mitral valve prolapse status post surgical correction at [facility] clinic x3, history of PSVT,PAT atrial fib on Coumadin , history of sick sinus syndrome status post pacemaker placement, history of Ehlers Danlos syndrome ,, status post carotid endarterectomy, history of hyperlipidemia and syncope in the past   DVT Prophylaxis Coumadin  AM Labs Ordered,  also please review Full Orders  Family Communication: Admission, patients condition and plan of care including tests being ordered have been discussed with the patient  who indicates understanding and agrees with the plan and Code Status.  Code Status full  Disposition  Plan: Home  Time spent in minutes : 31 minutes  Condition GUARDED

## 2013-01-29 ENCOUNTER — Encounter (HOSPITAL_COMMUNITY): Payer: Self-pay | Admitting: *Deleted

## 2013-01-29 DIAGNOSIS — I369 Nonrheumatic tricuspid valve disorder, unspecified: Secondary | ICD-10-CM

## 2013-01-29 DIAGNOSIS — G459 Transient cerebral ischemic attack, unspecified: Secondary | ICD-10-CM | POA: Diagnosis not present

## 2013-01-29 DIAGNOSIS — I6350 Cerebral infarction due to unspecified occlusion or stenosis of unspecified cerebral artery: Secondary | ICD-10-CM

## 2013-01-29 LAB — LIPID PANEL
CHOL/HDL RATIO: 3.7 ratio
CHOLESTEROL: 235 mg/dL — AB (ref 0–200)
HDL: 64 mg/dL (ref >39)
LDL Cholesterol: 155 mg/dL — ABNORMAL HIGH (ref 0–99)
TRIGLYCERIDES: 79 mg/dL (ref <150)
VLDL: 16 mg/dL (ref 0–40)

## 2013-01-29 LAB — PROTIME-INR
INR: 2 — ABNORMAL HIGH (ref 0.00–1.49)
Prothrombin Time: 22.1 seconds — ABNORMAL HIGH (ref 11.6–15.2)

## 2013-01-29 LAB — HEMOGLOBIN A1C
Hgb A1c MFr Bld: 6.1 % — ABNORMAL HIGH (ref <5.7)
Mean Plasma Glucose: 128 mg/dL — ABNORMAL HIGH (ref <117)

## 2013-01-29 MED ORDER — WHITE PETROLATUM GEL
Status: AC
  Administered 2013-01-29: 22:00:00
  Filled 2013-01-29: qty 5

## 2013-01-29 MED ORDER — WHITE PETROLATUM GEL
Status: AC
  Filled 2013-01-29: qty 5

## 2013-01-29 MED ORDER — WARFARIN SODIUM 5 MG PO TABS: 5.0000 mg | ORAL_TABLET | ORAL | Status: DC

## 2013-01-29 NOTE — Progress Notes (Signed)
*  PRELIMINARY RESULTS* Vascular Ultrasound Carotid Duplex (Doppler) has been completed.   Findings suggest 1-39% internal carotid artery stenosis bilaterally. Vertebral arteries are patent with antegrade flow.  01/29/2013 1:20 PM Maudry Mayhew, RVT, RDCS, RDMS

## 2013-01-29 NOTE — Evaluation (Signed)
Speech Language Pathology Evaluation Patient Details Name: Amy Lane MRN: [id number] DOB: 1950/04/25 Today's Date: 01/29/2013 Time: 3220-2542 SLP Time Calculation (min): 14 min  Problem List:  Patient Active Problem List   Diagnosis Date Noted  . Mental status change 01/28/2013  . TIA (transient ischemic attack) 01/28/2013  . Constipation 04/22/2012  . DVT (deep venous thrombosis) 11/21/2011  . Aphasia 08/03/2011  . Muscle weakness (generalized) 08/03/2011  . Abnormality of gait 08/03/2011  . Migraine without aura, without mention of intractable migraine without mention of status migrainosus 08/03/2011  . Encounter for therapeutic drug monitoring 08/03/2011  . Alopecia 03/09/2011  . TMJ syndrome 10/27/2010  . CARDIAC PACEMAKER DDD MDT 03/29/2010  . SVT/ PSVT/ PAT 07/28/2008  . ATRIAL FIBRILLATION 06/11/2008  . BRADYCARDIA-TACHYCARDIA SYNDROME 05/16/2007  . TRANSIENT ISCHEMIC ATTACK 05/16/2007  . EHLERS-DANLOS SYNDROME 05/16/2007  . SYNCOPE 05/16/2007  . HYPERCHOLESTEROLEMIA 05/15/2007  . CEREBROVASCULAR DISEASE 05/15/2007  . HYPOTHYROIDISM 01/28/2007  . DEPRESSION 01/28/2007  . MITRAL VALVE PROLAPSE 01/28/2007  . FIBROMYALGIA 01/28/2007  . DYSAUTONOMIA 01/28/2007   Past Medical History:  Past Medical History  Diagnosis Date  . Herpes zoster   . SVT (supraventricular tachycardia)   . PSVT (paroxysmal supraventricular tachycardia)   . PAT (paroxysmal atrial tachycardia)   . History of tachycardia-bradycardia syndrome   . Atrial fibrillation   . Dysautonomia   . History of syncope   . Cardiac pacemaker     DDD  MDT  . Long term (current) use of anticoagulants   . Allergic rhinitis   . Mitral valve prolapse   . Cerebrovascular disease   . Hypercholesteremia   . Hypothyroidism   . Hemorrhoids   . Ehler's-Danlos syndrome   . Fibromyalgia   . History of transient ischemic attack   . Depression   . Carcinomas, basal cell   . Skin desquamation    inflammative vaginitis  . Abnormal gait   . Hypercholesterolemia   . Headache(784.0)     Migraine   Past Surgical History:  Past Surgical History  Procedure Laterality Date  . Complex mitral valve repair      at the [facility]  . Left carotid endarterectomy  11/2003    by Dr. Delton See  . Pacemaker insertion      medtronic kappa [phone]  . Removed chalazion from left eye lid  09/2011    Dr. Satira Sark   HPI:  Amy Lane  is a 63 y.o. female, with a complicated past medical history consisting of mitral valve prolapse status post surgery in the past, Ehlers-Danlos syndrome and previous TIA and CVA presenting with 45 minutes episode of headache and altered mental status that happened while in the car with her husband. No slurring of speech no visual hallucinations diplopia or blurred vision, no chest pains or shortness of breath. Code stroke was called and CT of the head was negative. Patient admitted for observation.   Assessment / Plan / Recommendation Clinical Impression  Pt. and her husband describe expressive language deficits including dysnomia and perseveration prior to admission.  Currently, there is no evidence of aphasia or dysarthria, and pt. and husband feel she is back to baseline.  Conversation speech is normal,.  No f/u ST is needed at this time.    SLP Assessment  Patient does not need any further Speech Lanaguage Pathology Services    Follow Up Recommendations  None    Frequency and Duration        Pertinent Vitals/Pain n/a  SLP Evaluation Prior Functioning  Cognitive/Linguistic Baseline: Within functional limits Type of Home: House  Lives With: Spouse Available Help at Discharge: Family Vocation: Full time employment   Cognition  Overall Cognitive Status: Within Functional Limits for tasks assessed Arousal/Alertness: Awake/alert Orientation Level: Oriented X4    Comprehension  Auditory Comprehension Overall Auditory Comprehension: Appears  within functional limits for tasks assessed    Expression Expression Primary Mode of Expression: Verbal Verbal Expression Overall Verbal Expression: Appears within functional limits for tasks assessed Initiation: No impairment Written Expression Dominant Hand: Right   Oral / Motor Oral Motor/Sensory Function Overall Oral Motor/Sensory Function: Appears within functional limits for tasks assessed Motor Speech Overall Motor Speech: Appears within functional limits for tasks assessed   GO Functional Assessment Tool Used: Clinical judgement Functional Limitations: Spoken language expressive Spoken Language Expression Current Status [phone]): 0 percent impaired, limited or restricted Spoken Language Expression Goal Status (L4562): 0 percent impaired, limited or restricted Spoken Language Expression Discharge Status [phone]): 0 percent impaired, limited or restricted   Quinn Axe T 01/29/2013, 1:29 PM

## 2013-01-29 NOTE — Progress Notes (Signed)
Echo Lab  2D Echocardiogram completed.  [place], RDCS 01/29/2013 1:26 PM

## 2013-01-29 NOTE — Progress Notes (Signed)
UR completed 

## 2013-01-29 NOTE — Evaluation (Signed)
SLP Cancellation Note  Patient Details Name: Amy Lane MRN: [id number] DOB: [birth year]   Cancelled treatment:       Reason Eval/Treat Not Completed:  (pt not in bed when SLP attempted visit, working with PT, note per MD documentation, pt neuro symptoms back to baseline, will reattempt evaluation later)   Luanna Salk, [facility] SLP [phone]

## 2013-01-29 NOTE — Evaluation (Addendum)
Occupational Therapy Evaluation Patient Details Name: Amy Lane MRN: [id number] DOB: [birth year] Today's Date: 01/29/2013 Time: 2025-4270 OT Time Calculation (min): 23 min  OT Assessment / Plan / Recommendation History of present illness Amy Lane  is a 63 y.o. female, with a complicated past medical history consisting of mitral valve prolapse status post surgery in the past, Ehlers-Danlos syndrome and previous TIA and CVA presenting with 45 minutes episode of headache and altered mental status that happened while in the car with her husband. No slurring of speech no visual hallucinations diplopia or blurred vision, no chest pains or shortness of breath. Code stroke was called and CT of the head was negative. Patient admitted for observation.   Clinical Impression   Education provided to pt. Feel pt is safe to d/c home.     OT Assessment  Patient does not need any further OT services    Follow Up Recommendations  No OT follow up    Barriers to Discharge      Equipment Recommendations  None recommended by OT    Recommendations for Other Services    Frequency       Precautions / Restrictions     Pertinent Vitals/Pain No pain reported.     ADL  Lower Body Dressing: Supervision/safety Where Assessed - Lower Body Dressing: Unsupported sit to stand Toilet Transfer: Supervision/safety Toilet Transfer Method: Sit to stand Toilet Transfer Equipment: Comfort height toilet;Grab bars Tub/Shower Transfer: Supervision/safety Tub/Shower Transfer Method: Therapist, art: Walk in shower Transfers/Ambulation Related to ADLs: Supervision. ADL Comments: Recommended sitting for bathing/dressing. Told pt to have someone healthy with her when getting in and out of shower and while bathing. Educated to stand in front of bed/chair when pulling up LB clothing. Educated on theraputty exercises to increase strength in both hands as pt reports they have been weak and  she has had difficulty opening things.  Pt reports she has decreased balance at home. Educated on signs/symptoms of stroke and importance of getting help right away. Discussed avoiding canned foods. Told pt to have MD check her vision and ask about driving prior to d/c.     OT Diagnosis:    OT Problem List:   OT Treatment Interventions:     OT Goals(Current goals can be found in the care plan section)    Visit Information  Last OT Received On: 01/29/13 Assistance Needed: +1 History of Present Illness: Amy Lane  is a 63 y.o. female, with a complicated past medical history consisting of mitral valve prolapse status post surgery in the past, Ehlers-Danlos syndrome and previous TIA and CVA presenting with 45 minutes episode of headache and altered mental status that happened while in the car with her husband. No slurring of speech no visual hallucinations diplopia or blurred vision, no chest pains or shortness of breath. Code stroke was called and CT of the head was negative. Patient admitted for observation.       Prior [place] expects to be discharged to:: Private residence Living Arrangements: Spouse/significant other Available Help at Discharge: Family Type of Home: House Home Access: Stairs to enter Technical brewer of Steps: 4 Entrance Stairs-Rails: None Home Layout: Two level Home Equipment: Shower seat - built in Prior Function Level of Independence: Independent Communication Communication: No difficulties Dominant Hand: Right         Vision/Perception Vision - History Patient Visual Report: No change from baseline Vision - Assessment Vision Assessment: Vision tested Tracking/Visual Pursuits:  (  difficulty sustaining gaze in left superior quadrant) Visual Fields: No apparent deficits   Cognition  Cognition Arousal/Alertness: Awake/alert Behavior During Therapy: WFL for tasks assessed/performed Overall Cognitive  Status: Within Functional Limits for tasks assessed Memory: Decreased short-term memory    Extremity/Trunk Assessment Upper Extremity Assessment Upper Extremity Assessment: Generalized weakness Lower Extremity Assessment Lower Extremity Assessment: Defer to PT evaluation     Mobility       Exercise        End of Session OT - End of Session Equipment Utilized During Treatment: Gait belt;Other (comment) (theraputty) Activity Tolerance: Patient tolerated treatment well Patient left: in bed;with call bell/phone within reach  GO Functional Assessment Tool Used: clinical judgment Functional Limitation: Self care Self Care Current Status (X0383): At least 1 percent but less than 20 percent impaired, limited or restricted Self Care Goal Status (F3832): At least 1 percent but less than 20 percent impaired, limited or restricted Self Care Discharge Status [phone]): At least 1 percent but less than 20 percent impaired, limited or restricted   Benito Mccreedy OTR/L [phone] 01/29/2013, 10:54 AM

## 2013-01-29 NOTE — Progress Notes (Signed)
NEURO HOSPITALIST PROGRESS NOTE   SUBJECTIVE:                                                                                                                        Patient has had no further episodes of speech difficulty.  Feels back to her baseline.  No complaints.   OBJECTIVE:                                                                                                                           Vital signs in last 24 hours: Temp:  [97.2 F (36.2 C)-97.9 F (36.6 C)] 97.2 F (36.2 C) (01/07 1027) Pulse Rate:  [55-67] 62 (01/07 1027) Resp:  [12-20] 18 (01/07 1027) BP: (92-140)/(39-70) 123/66 mmHg (01/07 1027) SpO2:  [97 %-100 %] 98 % (01/07 1027) Weight:  [61.825 kg (136 lb 4.8 oz)] 61.825 kg (136 lb 4.8 oz) (01/06 2131)  Intake/Output from previous day:   Intake/Output this shift:   Nutritional status: Cardiac  Past Medical History  Diagnosis Date  . Herpes zoster   . SVT (supraventricular tachycardia)   . PSVT (paroxysmal supraventricular tachycardia)   . PAT (paroxysmal atrial tachycardia)   . History of tachycardia-bradycardia syndrome   . Atrial fibrillation   . Dysautonomia   . History of syncope   . Cardiac pacemaker     DDD  MDT  . Long term (current) use of anticoagulants   . Allergic rhinitis   . Mitral valve prolapse   . Cerebrovascular disease   . Hypercholesteremia   . Hypothyroidism   . Hemorrhoids   . Ehler's-Danlos syndrome   . Fibromyalgia   . History of transient ischemic attack   . Depression   . Carcinomas, basal cell   . Skin desquamation     inflammative vaginitis  . Abnormal gait   . Hypercholesterolemia   . Headache(784.0)     Migraine     Neurologic Exam:  Mental Status: Alert, oriented, thought content appropriate.  Speech fluent without evidence of aphasia.  Able to follow 3 step commands without difficulty. Cranial Nerves: II: Discs flat bilaterally; Visual fields grossly normal, pupils  equal, round, reactive to light and accommodation III,IV, VI: ptosis not present, extra-ocular motions intact bilaterally  V,VII: smile symmetric, facial light touch sensation normal bilaterally VIII: hearing normal bilaterally IX,X: gag reflex present XI: bilateral shoulder shrug XII: midline tongue extension without atrophy or fasciculations  Motor: Right : Upper extremity   5/5    Left:     Upper extremity   5/5  Lower extremity   5/5     Lower extremity   5/5 Tone and bulk:normal tone throughout; no atrophy noted Sensory: Pinprick and light touch intact throughout, bilaterally Deep Tendon Reflexes:  Right: Upper Extremity   Left: Upper extremity   biceps (C-5 to C-6) 2/4   biceps (C-5 to C-6) 2/4 tricep (C7) 2/4    triceps (C7) 2/4 Brachioradialis (C6) 2/4  Brachioradialis (C6) 2/4  Lower Extremity Lower Extremity  quadriceps (L-2 to L-4) 2/4   quadriceps (L-2 to L-4) 2/4 Achilles (S1) 2/4   Achilles (S1) 2/4  Plantars: Right: downgoing   Left: downgoing Cerebellar: normal finger-to-nose,  normal heel-to-shin test Gait: normal CV: pulses palpable throughout    Lab Results: Lab Results  Component Value Date/Time   CHOL 235* 01/29/2013  4:50 AM   Lipid Panel  Recent Labs  01/29/13 0450  CHOL 235*  TRIG 79  HDL 64  CHOLHDL 3.7  VLDL 16  LDLCALC 155*    Studies/Results: Dg Chest 2 View  01/28/2013   CLINICAL DATA:  Confusion  EXAM: CHEST  2 VIEW  COMPARISON:  July 19, 2011  FINDINGS: There is underlying emphysematous change. There is asymmetric apical pleural thickening on the left, stable. There is no edema or consolidation. The heart size and pulmonary vascularity are within normal limits. Pacemaker leads are attached to the right atrium and right ventricle, stable. No adenopathy. No pneumothorax. No bone lesions.  IMPRESSION: Underlying emphysema. No edema or consolidation. Stable asymmetric pleural thickening left apex.   Electronically Signed   By: Lowella Grip M.D.   On: 01/28/2013 21:53   Ct Head Wo Contrast  01/28/2013   CLINICAL DATA:  Dysphasia  EXAM: CT HEAD WITHOUT CONTRAST  TECHNIQUE: Contiguous axial images were obtained from the base of the skull through the vertex without intravenous contrast. Study was obtained within 24 hr of patient's arrival at the emergency department.  COMPARISON:  June 25, 2012  FINDINGS: The ventricles are normal in size and configuration. There is no mass, hemorrhage, extra-axial fluid collection, or midline shift. The gray-white compartments are normal. No acute infarct seen. Bony calvarium appears intact. The mastoid air cells are clear. There is stable calcification in the pineal gland.  IMPRESSION: No focal gray-white compartment lesions. No acute infarct apparent. No hemorrhage or mass effect.  Critical Value/emergent results were called by telephone at the time of interpretation on 01/28/2013 at 5:20 PM to Dr. Dorian Pod , who verbally acknowledged these results.   Electronically Signed   By: Lowella Grip M.D.   On: 01/28/2013 17:20    MEDICATIONS  Scheduled: . acebutolol  200 mg Oral Daily  . aspirin  81 mg Oral Daily  . cholecalciferol  2,000 Units Oral Daily  . levothyroxine  75 mcg Oral QAC breakfast  . mupirocin ointment   Topical TID  . polyethylene glycol  17 g Oral Daily  . senna  1 tablet Oral Daily  . [START ON 01/31/2013] warfarin  5 mg Oral Custom  . warfarin  7.5 mg Oral Custom  . Warfarin - Physician Dosing Inpatient   Does not apply q1800    ASSESSMENT/PLAN:                                                                                                            63 y.o. female with transient confusion and language impairment in the context of a severe HA. Symptoms have fully resolved. LDL 155 (patient allergic to statins and Niacin). INR 2.00. Head CT negative for  acute infarct. TIA workup still pending (Carotid doppler, Echo, A1c).  Recommend: 1) Continue with TIA work up (Carotid doppler, Echo, A1c). 2) Recommend lowering LDL <100 3) At discharge patient to follow up with Dr. Jannifer Franklin her primary neurologist [phone]  No further diagnostic tests recommended at this time.     Assessment and plan discussed with with attending physician and they are in agreement.    Etta Quill PA-C [facility] [phone]  01/29/2013, 11:06 AM

## 2013-01-29 NOTE — Progress Notes (Signed)
Physical Therapy Discharge Patient Details Name: Amy Lane MRN: [id number] DOB: Apr 19, 1950 Today's Date: 01/29/2013 Time: 7989-2119 PT Time Calculation (min): 20 min  Patient discharged from PT services secondary to goals met and no further PT needs identified.  Please see latest therapy progress note for current level of functioning and progress toward goals.    Progress and discharge plan discussed with patient and/or caregiver: Patient/Caregiver agrees with plan  GP Functional Assessment Tool Used: DGI Functional Limitation: Mobility: Walking and moving around Mobility: Walking and Moving Around Current Status (E1740): At least 1 percent but less than 20 percent impaired, limited or restricted Mobility: Walking and Moving Around Goal Status [phone]): At least 1 percent but less than 20 percent impaired, limited or restricted Mobility: Walking and Moving Around Discharge Status [phone]): At least 1 percent but less than 20 percent impaired, limited or restricted   Duncan Dull 01/29/2013, 10:45 AM

## 2013-01-29 NOTE — Progress Notes (Signed)
[facility] PROGRESS NOTE  Amy Lane EXH:[id number] DOB: 09/05/1950 DOA: 01/28/2013 PCP: Noralee Space, MD Brief HPI: Amy Lane is a 63 y.o. female, with a complicated past medical history consisting of mitral valve prolapse status post surgery in the past, Ehlers-Danlos syndrome and previous TIA and CVA presenting with 45 minutes episode of headache and altered mental status that happened while in the car with her husband.  Assessment/Plan:   TIA versus complicated migraine;  patient is back to baseline neurologically  CT of the head is negative . Patient placed on aspirin , neurochecks every 2 hours ,  Echocardiogram and carotid duplex is pending.    History of mitral valve prolapse status post surgical correction at [facility] clinic x3,   History of PSVT,PAT atrial fib on Coumadin  History of sick sinus syndrome status post pacemaker placement,  History of Ehlers Danlos syndrome  DVT Prophylaxis Coumadin      Code Status: full code Family Communication: husband at bedside  Disposition Plan: pending.    Consultants: neuro Procedures:  ECHO  Antibiotics:  none  HPI/Subjective: No new complaints.   Objective: Filed Vitals:   01/29/13 1027  BP: 123/66  Pulse: 62  Temp: 97.2 F (36.2 C)  Resp: 18   No intake or output data in the 24 hours ending 01/29/13 1212 Filed Weights   01/28/13 2131  Weight: 61.825 kg (136 lb 4.8 oz)    Exam:   General:  Alert afebrile   Cardiovascular: s1s2  Respiratory: ctab  Abdomen: soft nt ndbs+  Musculoskeletal: no pedal edema.   Data Reviewed: Basic Metabolic Panel:  Recent Labs Lab 01/28/13 1708 01/28/13 1717  NA 135* 135*  K 4.1 3.9  CL 98 101  CO2 23  --   GLUCOSE 105* 109*  BUN 15 14  CREATININE 0.68 0.80  CALCIUM 9.7  --    Liver Function Tests:  Recent Labs Lab 01/28/13 1708  AST 22  ALT 16  ALKPHOS 61  BILITOT 0.3  PROT 7.6  ALBUMIN 3.8   No results found for this  basename: LIPASE, AMYLASE,  in the last 168 hours No results found for this basename: AMMONIA,  in the last 168 hours CBC:  Recent Labs Lab 01/28/13 1708 01/28/13 1717  WBC 6.2  --   NEUTROABS 3.7  --   HGB 13.1 13.9  HCT 37.7 41.0  MCV 91.5  --   PLT 151  --    Cardiac Enzymes:  Recent Labs Lab 01/28/13 1708  TROPONINI <0.30   BNP (last 3 results) No results found for this basename: PROBNP,  in the last 8760 hours CBG: No results found for this basename: GLUCAP,  in the last 168 hours  No results found for this or any previous visit (from the past 240 hour(s)).   Studies: Dg Chest 2 View  01/28/2013   CLINICAL DATA:  Confusion  EXAM: CHEST  2 VIEW  COMPARISON:  July 19, 2011  FINDINGS: There is underlying emphysematous change. There is asymmetric apical pleural thickening on the left, stable. There is no edema or consolidation. The heart size and pulmonary vascularity are within normal limits. Pacemaker leads are attached to the right atrium and right ventricle, stable. No adenopathy. No pneumothorax. No bone lesions.  IMPRESSION: Underlying emphysema. No edema or consolidation. Stable asymmetric pleural thickening left apex.   Electronically Signed   By: Lowella Grip M.D.   On: 01/28/2013 21:53   Ct Head Wo Contrast  01/28/2013  CLINICAL DATA:  Dysphasia  EXAM: CT HEAD WITHOUT CONTRAST  TECHNIQUE: Contiguous axial images were obtained from the base of the skull through the vertex without intravenous contrast. Study was obtained within 24 hr of patient's arrival at the emergency department.  COMPARISON:  June 25, 2012  FINDINGS: The ventricles are normal in size and configuration. There is no mass, hemorrhage, extra-axial fluid collection, or midline shift. The gray-white compartments are normal. No acute infarct seen. Bony calvarium appears intact. The mastoid air cells are clear. There is stable calcification in the pineal gland.  IMPRESSION: No focal gray-white compartment  lesions. No acute infarct apparent. No hemorrhage or mass effect.  Critical Value/emergent results were called by telephone at the time of interpretation on 01/28/2013 at 5:20 PM to Dr. Dorian Pod , who verbally acknowledged these results.   Electronically Signed   By: Lowella Grip M.D.   On: 01/28/2013 17:20    Scheduled Meds: . acebutolol  200 mg Oral Daily  . aspirin  81 mg Oral Daily  . cholecalciferol  2,000 Units Oral Daily  . levothyroxine  75 mcg Oral QAC breakfast  . mupirocin ointment   Topical TID  . polyethylene glycol  17 g Oral Daily  . senna  1 tablet Oral Daily  . [START ON 01/31/2013] warfarin  5 mg Oral Custom  . warfarin  7.5 mg Oral Custom  . Warfarin - Physician Dosing Inpatient   Does not apply q1800   Continuous Infusions:   Principal Problem:   TRANSIENT ISCHEMIC ATTACK Active Problems:   TIA (transient ischemic attack)    Time spent: 25 min   [facility] Pager [phone] If 7PM-7AM, please contact night-coverage at [email or web address], password [facility] 01/29/2013, 12:12 PM  LOS: 1 day

## 2013-01-29 NOTE — Evaluation (Signed)
Physical Therapy Evaluation Patient Details Name: Amy Lane MRN: [id number] DOB: July 18, 1950 Today's Date: 01/29/2013 Time: 5573-2202 PT Time Calculation (min): 20 min  PT Assessment / Plan / Recommendation History of Present Illness  Amy Lane  is a 63 y.o. female, with a complicated past medical history consisting of mitral valve prolapse status post surgery in the past, Ehlers-Danlos syndrome and previous TIA and CVA presenting with 45 minutes episode of headache and altered mental status that happened while in the car with her husband. No slurring of speech no visual hallucinations diplopia or blurred vision, no chest pains or shortness of breath. Code stroke was called and CT of the head was negative. Patient admitted for observation.  Clinical Impression  Patient has no acute pt needs, safe with mobility and independent. Will sign off.    PT Assessment  Patent does not need any further PT services    Follow Up Recommendations  No PT follow up          Equipment Recommendations  None recommended by PT             Pertinent Vitals/Pain No pain at this time      Mobility  Ambulation/Gait Ambulation Distance (Feet): 620 Feet Number of Stairs: 5  WFL(Within Functional Limits), Independent        PT Goals(Current goals can be found in the care plan section) Acute Rehab PT Goals PT Goal Formulation: No goals set, d/c therapy  Visit Information  Last PT Received On: 01/29/13 Assistance Needed: +1 History of Present Illness: Amy Lane  is a 63 y.o. female, with a complicated past medical history consisting of mitral valve prolapse status post surgery in the past, Ehlers-Danlos syndrome and previous TIA and CVA presenting with 45 minutes episode of headache and altered mental status that happened while in the car with her husband. No slurring of speech no visual hallucinations diplopia or blurred vision, no chest pains or shortness of breath. Code stroke  was called and CT of the head was negative. Patient admitted for observation.       Prior [place] expects to be discharged to:: Private residence Living Arrangements: Spouse/significant other Available Help at Discharge: Family Type of Home: House Home Access: Stairs to enter Technical brewer of Steps: 4 Entrance Stairs-Rails: None Home Layout: Two level [place]: [place] in Prior Function Level of Independence: Independent Communication Communication: No difficulties Dominant Hand: Right    Cognition  Cognition Arousal/Alertness: Awake/alert Behavior During Therapy: WFL for tasks assessed/performed Overall Cognitive Status: Within Functional Limits for tasks assessed Memory: Decreased short-term memory    Extremity/Trunk Assessment Upper Extremity Assessment Upper Extremity Assessment: Defer to OT evaluation Lower Extremity Assessment Lower Extremity Assessment: Overall WFL for tasks assessed   Balance Dynamic Gait Index Level Surface: Normal Change in Gait Speed: Normal Gait with Horizontal Head Turns: Mild Impairment Gait with Vertical Head Turns: Mild Impairment Gait and Pivot Turn: Normal Step Over Obstacle: Normal Step Around Obstacles: Normal Steps: Normal Total Score: 22  End of Session PT - End of Session Equipment Utilized During Treatment: Gait belt Activity Tolerance: Patient tolerated treatment well Patient left: in bed;with call bell/phone within reach Nurse Communication: Mobility status  GP     Duncan Dull 01/29/2013, 9:54 AM Alben Deeds, PT DPT  [phone]

## 2013-01-30 DIAGNOSIS — G459 Transient cerebral ischemic attack, unspecified: Secondary | ICD-10-CM | POA: Diagnosis not present

## 2013-01-30 DIAGNOSIS — Q796 Ehlers-Danlos syndrome, unspecified: Secondary | ICD-10-CM

## 2013-01-30 LAB — PROTIME-INR
INR: 2.06 — ABNORMAL HIGH (ref 0.00–1.49)
Prothrombin Time: 22.6 seconds — ABNORMAL HIGH (ref 11.6–15.2)

## 2013-01-30 MED ORDER — EZETIMIBE 10 MG PO TABS: 10.0000 mg | ORAL_TABLET | Freq: Every day | ORAL | Status: DC

## 2013-01-30 NOTE — Discharge Summary (Signed)
Physician Discharge Summary  Amy Lane [id number] DOB: Jul 06, 1950 DOA: 01/28/2013  PCP: Amy Space, MD  Admit date: 01/28/2013 Discharge date: 01/30/2013  Time spent: 30 minutes  Recommendations for Outpatient Follow-up:  1. Follow up with PCP and neurology as recommended.  2. Follow up with cardiology as needed.   Discharge Diagnoses:  Principal Problem:   TRANSIENT ISCHEMIC ATTACK Active Problems:   TIA (transient ischemic attack)   Discharge Condition: improved.   Diet recommendation: low sodium diet  Filed Weights   01/28/13 2131  Weight: 61.825 kg (136 lb 4.8 oz)    History of present illness:  Amy Lane is a 63 y.o. female, with a complicated past medical history consisting of mitral valve prolapse status post surgery in the past, Ehlers-Danlos syndrome and previous TIA and CVA presenting with 45 minutes episode of headache and altered mental status that happened while in the car with her husband.   Hospital Course:  TIA versus complicated migraine;  patient is back to baseline neurologically  CT of the head is negative .  Patient placed on aspirin , and resumed coumadin.  Echocardiogram  Showed Wall thickness was normal. Systolic function was normal. The estimated ejection fraction was in the range of 55% to 60%. - Mitral valve: Myxomatous MV S/P repair with no residual MR - Left atrium: The atrium was mildly dilated.   and carotid duplex does not show significant stenosis.   SHE WAS STARTED ON ZETIA for hyperlipidemia.    History of mitral valve prolapse status post surgical correction [facility],   History of PSVT,PAT atrial fib on Coumadin . Therapeutic INR History of sick sinus syndrome status post pacemaker placement,  History of Ehlers Danlos syndrome     Procedures:  Echocardiogram.   Consultations:  neurology  Discharge Exam: Filed Vitals:   01/30/13 1010  BP: 92/54  Pulse: 60  Temp: 97.9 F (36.6 C)   Resp: 16    General: Alert afebrile Cardiovascular: s1s2 Respiratory: ctab Abdomen: soft nt ndbs+ Musculoskeletal: no pedal edema.    Discharge Instructions  Discharge Orders   Future Appointments Provider Department Dept Phone   02/05/2013 8:40 AM Amy Lane [place] [facility] [phone]   03/04/2013 11:00 AM Amy Ducking, MD [facility] [phone]   04/07/2013 11:30 AM Amy Sprang, MD [place] [facility] [phone]   Future Orders Complete By Expires   Discharge instructions  As directed    Comments:     FOLLOW UP WITH neurology and PCP as recommended.       Medication List         acebutolol 200 MG capsule  Commonly known as:  SECTRAL  Take 200 mg by mouth at bedtime.     ALIGN 4 MG Caps  Take 4 mg by mouth every morning.     aspirin EC 81 MG tablet  Take 81 mg by mouth at bedtime.     dexamethasone 0.5 MG/5ML elixir  0.5 mg by Mouth Rinse route daily. Rinse for 2 minutes and spit.     levothyroxine 75 MCG tablet  Commonly known as:  SYNTHROID, LEVOTHROID  Take 75 mcg by mouth daily before breakfast.     LORazepam 1 MG tablet  Commonly known as:  ATIVAN  Take 1 mg by mouth at bedtime.     nystatin cream  Commonly known as:  MYCOSTATIN  Apply 1 application topically 2 (two) times daily. Apply to affected area. Foot  and buttocks.     polyethylene glycol packet  Commonly known as:  MIRALAX / GLYCOLAX  Take 17 g by mouth every evening.     senna 8.6 MG Tabs tablet  Commonly known as:  SENOKOT  Take 1 tablet by mouth at bedtime.     VITAMIN B COMPLEX PO  Take 1 tablet by mouth daily.     Vitamin D 2000 UNITS tablet  Take 2,000 Units by mouth every morning.     warfarin 5 MG tablet  Commonly known as:  COUMADIN  Take 5-7.5 mg by mouth daily at 6 PM. Take one and one-half tablets (7.5 mg) everyday except on Monday take one tablet (5 mg).       Allergies  Allergen Reactions  . Other  Other (See Comments)    All QTc prolongation drugs  . Statins Other (See Comments)    myalgias       Follow-up Information   Follow up with Amy M, MD. Schedule an appointment as soon as possible for a visit in 1 week.   Specialty:  Pulmonary Disease   Contact information:   [place] [address] [phone]       Follow up with Amy Coffin, MD. Schedule an appointment as soon as possible for a visit in 2 weeks.   Specialty:  Neurology   Contact information:   [address] [place] [address] [phone]        The results of significant diagnostics from this hospitalization (including imaging, microbiology, ancillary and laboratory) are listed below for reference.    Significant Diagnostic Studies: Dg Chest 2 View  01/28/2013   CLINICAL DATA:  Confusion  EXAM: CHEST  2 VIEW  COMPARISON:  July 19, 2011  FINDINGS: There is underlying emphysematous change. There is asymmetric apical pleural thickening on the left, stable. There is no edema or consolidation. The heart size and pulmonary vascularity are within normal limits. Pacemaker leads are attached to the right atrium and right ventricle, stable. No adenopathy. No pneumothorax. No bone lesions.  IMPRESSION: Underlying emphysema. No edema or consolidation. Stable asymmetric pleural thickening left apex.   Electronically Signed   By: Amy Lane Lane.D.   On: 01/28/2013 21:53   Ct Head Wo Contrast  01/28/2013   CLINICAL DATA:  Dysphasia  EXAM: CT HEAD WITHOUT CONTRAST  TECHNIQUE: Contiguous axial images were obtained from the base of the skull through the vertex without intravenous contrast. Study was obtained within 24 hr of patient's arrival at the emergency department.  COMPARISON:  June 25, 2012  FINDINGS: The ventricles are normal in size and configuration. There is no mass, hemorrhage, extra-axial fluid collection, or midline shift. The gray-white compartments are normal. No acute  infarct seen. Bony calvarium appears intact. The mastoid air cells are clear. There is stable calcification in the pineal gland.  IMPRESSION: No focal gray-white compartment lesions. No acute infarct apparent. No hemorrhage or mass effect.  Critical Value/emergent results were called by telephone at the time of interpretation on 01/28/2013 at 5:20 PM to Dr. Dorian Pod , who verbally acknowledged these results.   Electronically Signed   By: Amy Lane Lane.D.   On: 01/28/2013 17:20    Microbiology: Recent Results (from the past 240 hour(s))  URINE CULTURE     Status: None   Collection Time    01/28/13  7:36 PM      Result Value Range Status   Specimen Description URINE, RANDOM  Final   Special Requests NONE   Final   Culture  Setup Time     Final   Value: 01/29/2013 05:04     Performed at [place] PENDING   Incomplete   Culture     Final   Value: Culture reincubated for better growth     Performed at [facility]   Report Status PENDING   Incomplete     Labs: Basic Metabolic Panel:  Recent Labs Lab 01/28/13 1708 01/28/13 1717  NA 135* 135*  K 4.1 3.9  CL 98 101  CO2 23  --   GLUCOSE 105* 109*  BUN 15 14  CREATININE 0.68 0.80  CALCIUM 9.7  --    Liver Function Tests:  Recent Labs Lab 01/28/13 1708  AST 22  ALT 16  ALKPHOS 61  BILITOT 0.3  PROT 7.6  ALBUMIN 3.8   No results found for this basename: LIPASE, AMYLASE,  in the last 168 hours No results found for this basename: AMMONIA,  in the last 168 hours CBC:  Recent Labs Lab 01/28/13 1708 01/28/13 1717  WBC 6.2  --   NEUTROABS 3.7  --   HGB 13.1 13.9  HCT 37.7 41.0  MCV 91.5  --   PLT 151  --    Cardiac Enzymes:  Recent Labs Lab 01/28/13 1708  TROPONINI <0.30   BNP: BNP (last 3 results) No results found for this basename: PROBNP,  in the last 8760 hours CBG: No results found for this basename: GLUCAP,  in the last 168  hours     Signed:  Arsh Feutz  [facility] 01/30/2013, 1:05 PM

## 2013-01-31 LAB — URINE CULTURE: Colony Count: 30000

## 2013-02-05 ENCOUNTER — Encounter: Payer: No Typology Code available for payment source | Admitting: *Deleted

## 2013-02-05 ENCOUNTER — Encounter: Payer: Self-pay | Admitting: Cardiovascular Disease

## 2013-02-05 DIAGNOSIS — Z950 Presence of cardiac pacemaker: Secondary | ICD-10-CM

## 2013-02-05 DIAGNOSIS — I495 Sick sinus syndrome: Secondary | ICD-10-CM

## 2013-02-05 LAB — MDC_IDC_ENUM_SESS_TYPE_REMOTE
Battery Impedance: 183 Ohm
Battery Voltage: 2.8 V
Brady Statistic AP VP Percent: 0.1 %
Brady Statistic AP VS Percent: 77.1 %
Brady Statistic AS VS Percent: 22.7 %
Lead Channel Pacing Threshold Amplitude: 0.625 V
Lead Channel Pacing Threshold Pulse Width: 0.4 ms
Lead Channel Sensing Intrinsic Amplitude: 1.4 mV
Lead Channel Setting Pacing Amplitude: 2.5 V
Lead Channel Setting Pacing Pulse Width: 0.4 ms
MDC IDC MSMT LEADCHNL RA IMPEDANCE VALUE: 436 Ohm
MDC IDC MSMT LEADCHNL RA PACING THRESHOLD PULSEWIDTH: 0.4 ms
MDC IDC MSMT LEADCHNL RV IMPEDANCE VALUE: 633 Ohm
MDC IDC MSMT LEADCHNL RV PACING THRESHOLD AMPLITUDE: 1 V
MDC IDC MSMT LEADCHNL RV SENSING INTR AMPL: 5.6 mV
MDC IDC SET LEADCHNL RA PACING AMPLITUDE: 2 V
MDC IDC SET LEADCHNL RV SENSING SENSITIVITY: 2.8 mV
MDC IDC STAT BRADY AS VP PERCENT: 0.1 % — AB

## 2013-02-05 LAB — PROTIME-INR

## 2013-02-10 ENCOUNTER — Ambulatory Visit (INDEPENDENT_AMBULATORY_CARE_PROVIDER_SITE_OTHER): Payer: No Typology Code available for payment source | Admitting: Adult Health

## 2013-02-10 ENCOUNTER — Encounter: Payer: Self-pay | Admitting: Adult Health

## 2013-02-10 VITALS — BP 126/66 | HR 61 | Temp 98.1°F | Ht 70.5 in | Wt 136.2 lb

## 2013-02-10 DIAGNOSIS — G459 Transient cerebral ischemic attack, unspecified: Secondary | ICD-10-CM

## 2013-02-10 DIAGNOSIS — E7800 Pure hypercholesterolemia, unspecified: Secondary | ICD-10-CM

## 2013-02-10 MED ORDER — EZETIMIBE 10 MG PO TABS: 10.0000 mg | ORAL_TABLET | Freq: Every day | ORAL | Status: DC

## 2013-02-10 NOTE — Patient Instructions (Signed)
Continue on Zetia daily .  Low fat cholesterol diet  Advance exercise as tolerated.  follow up with Neurology as planned next month  follow up Dr. Lenna Gilford  In 2 months and As needed   Please contact office for sooner follow up if symptoms do not improve or worsen or seek emergency care

## 2013-02-10 NOTE — Progress Notes (Signed)
Subjective:    Patient ID: Amy Lane, female    DOB: 1950-08-08, 63 y.o.   MRN: [id number]  HPI 63 y/o WF   mult med problems as noted below... she is followed regularly by Drs Maryellen Pile for Cardiology, and Drs Jeri Modena for GI...   02/10/2013 [facility] follow up  Patient returns for a post hospital followup. She was admitted January 6 through January 8 for a TIA. Patient was brought to the emergency room for altered mental status. Said she had trouble talking  And visual disturbances .  Symptoms last about 52min . CT of the head was negative. Seen by Neurology .  Patient was placed on aspirin and resumed on her Coumadin. Her echo showed normal ejection fracture at 55-60%, status post mitral valve repair with no residual MR. The left atrium was mildly dilated. Carotid duplex showed no significant stenosis. Patient was started on Zetia for hyperlipidemia. TC 235, and LDL 155 . Has had trouble with statin in past.  Still works part time.  At discharge she was back to baseline.  INR was 2.14 at admission.  Tolerating zetia without known problems.  She denies any chest pain, orthopnea, PND, leg swelling, extremity weakness, visual or speech changes             Problem List:      ALLERGIC RHINITIS (ICD-477.9) - we discussed Rx w/ Zyrtek, Astepro Prn, etc... ~  CXR 3/11 showed left pacer, sternal wires, NAD...  MITRAL VALVE PROLAPSE (ICD-424.0) - followed by Cherly Hensen... ~  2DEcho 11/08 showed myxomatous MV, annuloplasty ring, decr post leaflet excursion, norm LVH & wall motion... ~  NuclearStressTest 11/08 showed mild apical thinning... prev study 12/04 w/o ischemia or infarct & EF=68%... ~  she sees [place] every 6 months- for f/u MVP (s/p MV repair), dysautonomia (s/p pacer), & Ehlers-Danlos Syndrome> notes reviewed...  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 hx AFib/Flutter w/ ablation & pacemaker placed ... she  has Ehlers-Danlos synd w/ prolonged QT interval...   CARDIAC PACEMAKER IN SITU (ICD-V45.01) - f/u DrKlein w/ CTChest 4/09- neg. ~  dual chamber pacer changed 3/11 by DrKlein for end-of-life...  DYSAUTONOMIA (ICD-742.8) - prev on Proamatine 5mg  tabs per DrKlein, but this was stopped...  CEREBROVASCULAR DISEASE (ICD-437.9) - on ASA 81mg /d & COUMADIN (followed in the clinic)... ~  she had a Left CAE 11/05 by [facility] for symptomatic left carotid stenosis w/ ulceration... ~  Carotid angiogram 5/08 by [facility] showed <30% right carotid stenosis ~  f/u CTA of Head & Neck 12/09 was neg= norm CTA head, & no signif carotid dis noted in neck. ~  CDoppler 2/11 showed stable mild carotid dis, left CAE w/ DPA is patent, 0-39% bilat ICA stenoses. ~  repear CDopplers 11/11 per Cherly Hensen showed smooth plaque in right bulb & distal left CCA- stable; 0-39% bilat ICA stenoses...  ?DVT & poss PTE while on vacation in [place] during the summer of 2012 >> pt was on Coumadin & INR was therapeutic; she had pain & swelling in left leg w/ a pos doppler reported by a [place] physician; she was also SOB & they questioned poss PTE but didn't get scan or further eval;  On return to [place] DrNishan wondered if she might have bled into the leg muscles & sent her to DrNorris for Ortho eval; he could not corroborate the theory & treated her w/ compression stockings & she gradually improved towards her baseline.Marland KitchenMarland Kitchen  HYPERCHOLESTEROLEMIA (ICD-272.0) - on diet alone now, prev on Lip20- this was stopped 1/12 by Cherly Hensen & she feels much better off this med! ~  Joice 05/13/07 on Lip20 showed TChol 119, TG 50, HDL 55, LDL 54 ~  FLP 5/10 on Lip20 showed TChol 120, TG 43, HDL 58, LDL 54 ~  FLP 9/11 on Lip20 showed TChol 130, TG 54, HDL 59, LDL 60 ~  FLP 5/12 on diet alone showed TChol 218, TG 91, HDL 57, LDL 135... Continue low chol, low fat diet. ~  FLP 10/12 showed TChol 231, TG 86, HDL 68, LDL 151... I rec the Lipid CLinic, & they started  NIACIN 500mg /d. ~  FLP 2/13 on Niacin500 showed TChol 199, TG 55, HDL 76, LDL 112... Much improved, continue same. ~  FLP 11/13 on diet alone showed TChol 231, TG 49, HDL 67, LDL 151..,. rec refer to Lipid clinic.  HYPOTHYROIDISM (ICD-244.9) - currently on SYNTHROID 55mcg/d... ~  labs 4/09 on Levoth88 showed TSH = 0.10... rec to decr Synthroid to 7mcg/d... ~  labs 9/09 on Levoth75 showed TSH = 1.67 ~  labs 5/10 on Levoth75 showed TSH = 2.92... Continue same. ~  labs 9/11 on Levoth75 showed TSH= 1.37 ~  Labs 5/12 on Levothy75 showed TSH= 4.33 ~  Labs 10/12 on Levothy75 showed TSH= 1.51... Continue same. ~  Labs 11/13 on Levothy75 showed TSH= 1.55  HEMORRHOIDS (ICD-455.6) - last colonoscopy 2/09 by DrJacobs showed only sm hems... f/u 106yrs.  GYN - followed by DrMcPhail and Dx w/ kraurosis vulvae 7/10... Prev on Premarin VagCream. ~  11/10: she discussed the Dx w/ me & will inquire about poss hyst/ BSO... ~  4/11:  eval in the [facility] by Dr Illene Bolus showed vulvar dermatitis & dryness w/ rec for "Crisco" Tid! 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   FIBROMYALGIA (ICD-729.1) - she has been doing Yoga classes and this has helped...  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  Hx of TRANSIENT ISCHEMIC ATTACK (ICD-435.9) - on ASA 81mg /d & COUMADIN> she has been followed by DrWillis; s/p left carotid endarterectomy 11/05 by [facility]. ~  1/12: f/u eval by DrWillis reviewed...  Hx of SYNCOPE (ICD-780.2) - felt to be neurally mediated, ? related to long QT, no recur since pacer placed...  MIGRAINE HEADACHE >> she presented w/ ?TIA symptoms 6/13 & eval was neg; review by DrWillis indicated prob Migraine & everything resolved, no recurrence...  DEPRESSION (ICD-311) - she takes LORAZEPAM 1mg Qhs for insomnia... she prev saw DrGraves  for counselling in [place]... ~  11/10: poor appetite & some wt loss prob related to depression & we discussed trial Lexapro 10mg /d... ~  3/11: she is very pleased- feels better, gained 5# w/ Ensure, etc... ~  9/11:  "I feel great" & Lexapro continued... ~  She was able to stop the Lexapro 1/12 (also stopped the Lipitor) & has done well off it ever since... ~  10/13:  She called w/ situational depression due to marital stress, getting counseling, etc; started PROZAC 20mg /d & she reports much improved...  CARCINOMA, BASAL CELL (ICD-173.9) - skin cancer removed from left leg by [facility] 2008. ~  12/11:  She reports bx of rash on legs= lichen planus per DrHall, treated w/ topical ointment... ~  2/13:  She had a skin lesion removed from her neck= basal cell ca &  wide excision performed by DrLupton.  Health Maintenance: ~  GI:  followed by DrJacobs w/ colonoscopy 2/09 showing only sm hems... ~  GYN:  followed by DrMcPhail, and Dr. Illene Bolus at [place] [facility]... ~  Labs 9/11 showed Vit D level = 39 & rec to start 1000 u OTC supplement... ~  Immunizations:  she had TDAP 3/11... she gets yearly Flu vaccine in the Fall of the yr...    Past Surgical History  Procedure Laterality Date  . Complex mitral valve repair      at the [facility]  . Left carotid endarterectomy  11/2003    by Dr. Delton See  . Pacemaker insertion      medtronic kappa 901  . Removed chalazion from left eye lid  09/2011    Dr. Satira Sark    Outpatient Encounter Prescriptions as of 02/10/2013  Medication Sig  . acebutolol (SECTRAL) 200 MG capsule Take 200 mg by mouth at bedtime.   Marland Kitchen aspirin EC 81 MG tablet Take 81 mg by mouth at bedtime.  . Cholecalciferol (VITAMIN D) 2000 UNITS tablet Take 2,000 Units by mouth every morning.  Marland Kitchen dexamethasone 0.5 MG/5ML elixir 0.5 mg by Mouth Rinse route daily. Rinse for 2 minutes and spit.  Marland Kitchen ezetimibe (ZETIA) 10 MG tablet Take 1 tablet (10 mg total) by mouth daily.  Marland Kitchen  levothyroxine (SYNTHROID, LEVOTHROID) 75 MCG tablet Take 75 mcg by mouth daily before breakfast.  . LORazepam (ATIVAN) 1 MG tablet Take 1 mg by mouth at bedtime.  . polyethylene glycol (MIRALAX / GLYCOLAX) packet Take 17 g by mouth every evening.  . Probiotic Product (ALIGN) 4 MG CAPS Take 4 mg by mouth every morning.  . senna (SENOKOT) 8.6 MG TABS tablet Take 1 tablet by mouth at bedtime.  Marland Kitchen warfarin (COUMADIN) 5 MG tablet Take 5-7.5 mg by mouth daily at 6 PM. Take one and one-half tablets (7.5 mg) everyday except on Monday take one tablet (5 mg).  . [DISCONTINUED] B Complex Vitamins (VITAMIN B COMPLEX PO) Take 1 tablet by mouth daily.  . [DISCONTINUED] nystatin cream (MYCOSTATIN) Apply 1 application topically 2 (two) times daily. Apply to affected area. Foot and buttocks.    ALLERGIRES:  All QT drugs, and Adhesive Tape...   Current Medications, Allergies, Past Medical History, Past Surgical History, Family History, and Social History were reviewed in [employer] record.    Review of Systems        Constitutional:   No  weight loss, night sweats,  Fevers, chills, fatigue, or  lassitude.  HEENT:   No headaches,  Difficulty swallowing,  Tooth/dental problems, or  Sore throat,                No sneezing, itching, ear ache, nasal congestion, post nasal drip,   CV:  No chest pain,  Orthopnea, PND, swelling in lower extremities, anasarca, dizziness, palpitations, syncope.   GI  No heartburn, indigestion, abdominal pain, nausea, vomiting, diarrhea, , loss of appetite, bloody stools.    Resp:  No excess mucus, no productive cough,  No non-productive cough,  No coughing up of blood.  No change in color of mucus.  No wheezing.  No chest wall deformity  Skin: no rash or lesions.  GU: no dysuria, change in color of urine, no urgency or frequency.  No flank pain, no hematuria   MS:  No joint pain or swelling.  No decreased range of motion.  No back pain.  Psych:  No  change in mood or affect. No depression or anxiety.  No memory loss.         Objective:   Physical Exam     WD, Thin, 63 y/o WF in NAD Vital Signs:  Reviewed... GENERAL:  Alert & oriented; pleasant & cooperative... HEENT:  [place]/AT, EOM-wnl, PERRLA, EACs-clear, TMs-wnl, NOSE-clear, THROAT-clear & wnl. NECK:  Supple w/ fair ROM; no JVD; normal carotid impulses w/o bruits, left CAE scar; no thyromegaly or nodules palpated; no lymphadenopathy. CHEST:  Clear to P & A; without wheezes/ rales/ or rhonchi heard... HEART:  Regular Rhythm; pacer on left, without murmurs/ rubs/ or gallops detected... ABDOMEN:  Soft & nontender; normal bowel sounds; no organomegaly or masses palpated... EXT: without deformities or arthritic changes; no varicose veins/ venous insuffic/ or edema. NEURO:  CN's intact;  no focal neuro deficits... DERM:   No rash     Assessment & Plan:

## 2013-02-10 NOTE — Assessment & Plan Note (Signed)
Hyperlipidemia with LDL goal less than 70-100 Patient's been intolerant to statins in the past. She is tolerating Zetia  Advised on low-fat, low-cholesterol diet

## 2013-02-10 NOTE — Assessment & Plan Note (Signed)
Recent TIA with no residual deficits.-Despite being therapeutic on Coumadin. Patient is advised on healthy lifestyle measures. Patient will continue on Zetia for hyperlipidemia .  She is to followup with neurology as planned. Next month. She will continue on Coumadin with weekly monitoring through cardiology.

## 2013-02-10 NOTE — Addendum Note (Signed)
Addended by: Parke Poisson E on: 02/10/2013 01:03 PM   Modules accepted: Orders

## 2013-02-12 ENCOUNTER — Encounter: Payer: Self-pay | Admitting: Cardiovascular Disease

## 2013-02-12 LAB — PROTIME-INR

## 2013-02-19 ENCOUNTER — Encounter: Payer: Self-pay | Admitting: Cardiovascular Disease

## 2013-02-19 LAB — PROTIME-INR

## 2013-02-26 ENCOUNTER — Encounter: Payer: Self-pay | Admitting: *Deleted

## 2013-02-26 ENCOUNTER — Encounter: Payer: Self-pay | Admitting: Cardiovascular Disease

## 2013-02-26 LAB — PROTIME-INR

## 2013-03-04 ENCOUNTER — Ambulatory Visit (INDEPENDENT_AMBULATORY_CARE_PROVIDER_SITE_OTHER): Payer: PRIVATE HEALTH INSURANCE | Admitting: Neurology

## 2013-03-04 ENCOUNTER — Encounter: Payer: Self-pay | Admitting: Neurology

## 2013-03-04 ENCOUNTER — Encounter: Payer: Self-pay | Admitting: Internal Medicine

## 2013-03-04 VITALS — BP 105/60 | HR 67 | Wt 135.0 lb

## 2013-03-04 DIAGNOSIS — R269 Unspecified abnormalities of gait and mobility: Secondary | ICD-10-CM

## 2013-03-04 DIAGNOSIS — G43009 Migraine without aura, not intractable, without status migrainosus: Secondary | ICD-10-CM

## 2013-03-04 DIAGNOSIS — R413 Other amnesia: Secondary | ICD-10-CM | POA: Insufficient documentation

## 2013-03-04 NOTE — Progress Notes (Signed)
Reason for visit: Possible TIA  JAMIRIA LANGILL is an 63 y.o. female  History of present illness:  Ms. Riches is a 63 year old right-handed white female with a history of Ehlers-Danlos syndrome, atrial fibrillation, and a mitral valve repair. The patient has a history of migraine equivalent with stroke like features. The patient had recurrence of a similar event that required admission on 01/28/2013. The patient had bright flashing lights across the visual fields bilaterally, and this then progressed to confusion and aphasia. The patient was only able to say one word, "asbestos", even though she did not know what this meant. The patient had resolution of her symptoms within 45 minutes, without any headache. The patient underwent a stroke workup that included a CT scan of brain that was unremarkable, a 2-D echocardiogram was done with an ejection fraction of 55-60%. A carotid Doppler study was unremarkable. Low-dose aspirin was added to her Coumadin, and Zetia was added to her medication regimen as she had an LDL cholesterol of 155. The patient has done well since that hospitalization. The patient has had similar events in the past that were felt to be secondary to migraine equivalent. The patient returns to this office for further evaluation. The patient has some mild memory issues that have not progressed significantly since last seen.  Past Medical History  Diagnosis Date  . Herpes zoster   . SVT (supraventricular tachycardia)   . PSVT (paroxysmal supraventricular tachycardia)   . PAT (paroxysmal atrial tachycardia)   . History of tachycardia-bradycardia syndrome   . Atrial fibrillation   . Dysautonomia   . History of syncope   . Cardiac pacemaker     DDD  MDT  . Long term (current) use of anticoagulants   . Allergic rhinitis   . Mitral valve prolapse   . Cerebrovascular disease   . Hypercholesteremia   . Hypothyroidism   . Hemorrhoids   . Ehler's-Danlos syndrome   .  Fibromyalgia   . History of transient ischemic attack   . Depression   . Carcinomas, basal cell   . Skin desquamation     inflammative vaginitis  . Abnormal gait   . Hypercholesterolemia   . Headache(784.0)     Migraine    Past Surgical History  Procedure Laterality Date  . Complex mitral valve repair      at the [facility]  . Left carotid endarterectomy  11/2003    by Dr. Delton See  . Pacemaker insertion      medtronic kappa 901  . Removed chalazion from left eye lid  09/2011    Dr. Satira Sark    Family History  Problem Relation Age of Onset  . Cancer      family history of  . Coronary artery disease      family history of  . Leukemia      Nephew  . Lung cancer Mother   . Prostate cancer Father   . Alzheimer's disease Father   . Heart disease Father   . Mitral valve prolapse Brother   . Diabetes Sister   . Mitral valve prolapse Sister     Social history:  reports that she has quit smoking. Her smoking use included Cigarettes. She smoked 0.00 packs per day. She has never used smokeless tobacco. She reports that she drinks alcohol. She reports that she does not use illicit drugs.    Allergies  Allergen Reactions  . Other Other (See Comments)    All QTc prolongation drugs  .  Statins Other (See Comments)    myalgias    Medications:  Current Outpatient Prescriptions on File Prior to Visit  Medication Sig Dispense Refill  . acebutolol (SECTRAL) 200 MG capsule Take 200 mg by mouth at bedtime.       Marland Kitchen aspirin EC 81 MG tablet Take 81 mg by mouth at bedtime.      . Cholecalciferol (VITAMIN D) 2000 UNITS tablet Take 2,000 Units by mouth every morning.      Marland Kitchen dexamethasone 0.5 MG/5ML elixir 0.5 mg by Mouth Rinse route daily. Rinse for 2 minutes and spit.      Marland Kitchen ezetimibe (ZETIA) 10 MG tablet Take 1 tablet (10 mg total) by mouth daily.  30 tablet  5  . levothyroxine (SYNTHROID, LEVOTHROID) 75 MCG tablet Take 75 mcg by mouth daily before breakfast.      . LORazepam  (ATIVAN) 1 MG tablet Take 1 mg by mouth at bedtime.      . polyethylene glycol (MIRALAX / GLYCOLAX) packet Take 17 g by mouth every evening.      . Probiotic Product (ALIGN) 4 MG CAPS Take 4 mg by mouth every morning.      . senna (SENOKOT) 8.6 MG TABS tablet Take 1 tablet by mouth at bedtime.      Marland Kitchen warfarin (COUMADIN) 5 MG tablet Take 5-7.5 mg by mouth daily at 6 PM. Take one and one-half tablets (7.5 mg) everyday except on Monday take one tablet (5 mg).       No current facility-administered medications on file prior to visit.    ROS:  Out of a complete 14 system review of symptoms, the patient complains only of the following symptoms, and all other reviewed systems are negative.  Neck stiffness Runny nose Light sensitivity Shortness of breath, choking Cold intolerance Abdominal pain, constipation Insomnia Walking difficulties, balance issues Dizziness  Blood pressure 105/60, pulse 67, weight 135 lb (61.236 kg).  Physical Exam  General: The patient is alert and cooperative at the time of the examination.  Skin: No significant peripheral edema is noted.   Neurologic Exam  Mental status: The patient is oriented x 3. Mini-Mental status examination done today shows a total score of 29/30.  Cranial nerves: Facial symmetry is present. Speech is normal, no aphasia or dysarthria is noted. Extraocular movements are full. Visual fields are full.  Motor: The patient has good strength in all 4 extremities.  Sensory examination: Soft touch sensation is symmetric on the face, arms, and legs.  Coordination: The patient has good finger-nose-finger and heel-to-shin bilaterally.  Gait and station: The patient has a normal gait. Tandem gait is normal. Romberg is negative. No drift is seen.  Reflexes: Deep tendon reflexes are symmetric.   Assessment/Plan:  1. Migraine equivalent  2. Mild memory disturbance  3. Ehlers Danlos syndrome  The patient is doing well at this time. We  will continue to follow the patient over time concerning the memory issues. If progression is noted, the patient will be placed on medication. The patient likely did not have a TIA prior to her recent admission. The patient will followup through this office in 8 months.  [name] MD 03/04/2013 7:49 PM  [facility] [address] [place], [place] [address]  Phone [phone] Fax [phone]

## 2013-03-05 ENCOUNTER — Encounter: Payer: Self-pay | Admitting: Cardiovascular Disease

## 2013-03-05 LAB — PROTIME-INR

## 2013-03-19 ENCOUNTER — Encounter: Payer: Self-pay | Admitting: Cardiovascular Disease

## 2013-03-19 LAB — PROTIME-INR

## 2013-03-26 LAB — POCT INR: INR: 1.6

## 2013-03-27 ENCOUNTER — Telehealth: Payer: Self-pay | Admitting: *Deleted

## 2013-03-27 ENCOUNTER — Encounter: Payer: Self-pay | Admitting: Pharmacist

## 2013-03-27 NOTE — Progress Notes (Signed)
This encounter was created in error - please disregard.

## 2013-03-27 NOTE — Telephone Encounter (Signed)
Spoke with pt she states got the INR 1.6  Result yesterday, so she usually takes 7.5 mg  Of coumadin every day except on Monday wish takes 5 mg. Pt  took 10 mg yesterday and she will take 7.5 mg the rest of the week.

## 2013-03-27 NOTE — Telephone Encounter (Signed)
Received INR results from [facility]. INR was 1.6. As per medication list pt takes coumadin 7.5 mg every day except mondays she takes 5 mg. I left pt a message to call back to verify coumadin dose. Pt's INR on 03/19/13 was 2.06

## 2013-03-29 ENCOUNTER — Other Ambulatory Visit: Payer: Self-pay | Admitting: Pulmonary Disease

## 2013-04-02 ENCOUNTER — Encounter: Payer: Self-pay | Admitting: Cardiovascular Disease

## 2013-04-02 ENCOUNTER — Telehealth: Payer: Self-pay | Admitting: Pulmonary Disease

## 2013-04-02 LAB — PROTIME-INR

## 2013-04-02 MED ORDER — LORAZEPAM 1 MG PO TABS: 1.0000 mg | ORAL_TABLET | Freq: Every day | ORAL | Status: DC

## 2013-04-02 NOTE — Telephone Encounter (Signed)
Spoke w/ pt. She needed RX for lorazepam called in not losartan. RX called in. Nothing further needed

## 2013-04-07 ENCOUNTER — Encounter: Payer: Self-pay | Admitting: Internal Medicine

## 2013-04-07 ENCOUNTER — Ambulatory Visit (INDEPENDENT_AMBULATORY_CARE_PROVIDER_SITE_OTHER): Payer: No Typology Code available for payment source | Admitting: Internal Medicine

## 2013-04-07 VITALS — BP 125/76 | HR 60 | Ht 70.0 in | Wt 132.8 lb

## 2013-04-07 DIAGNOSIS — I498 Other specified cardiac arrhythmias: Secondary | ICD-10-CM

## 2013-04-07 DIAGNOSIS — Z950 Presence of cardiac pacemaker: Secondary | ICD-10-CM

## 2013-04-07 DIAGNOSIS — R001 Bradycardia, unspecified: Secondary | ICD-10-CM

## 2013-04-07 DIAGNOSIS — G909 Disorder of the autonomic nervous system, unspecified: Secondary | ICD-10-CM

## 2013-04-07 DIAGNOSIS — I4891 Unspecified atrial fibrillation: Secondary | ICD-10-CM

## 2013-04-07 DIAGNOSIS — G901 Familial dysautonomia [Riley-Day]: Secondary | ICD-10-CM

## 2013-04-07 LAB — MDC_IDC_ENUM_SESS_TYPE_INCLINIC
Battery Impedance: 183 Ohm
Battery Remaining Longevity: 126 mo
Battery Voltage: 2.8 V
Brady Statistic AP VP Percent: 0 %
Brady Statistic AP VS Percent: 77 %
Brady Statistic AS VP Percent: 0 %
Date Time Interrogation Session: 20150316115718
Lead Channel Impedance Value: 430 Ohm
Lead Channel Pacing Threshold Amplitude: 0.75 V
Lead Channel Pacing Threshold Amplitude: 1 V
Lead Channel Pacing Threshold Pulse Width: 0.4 ms
Lead Channel Sensing Intrinsic Amplitude: 11.2 mV
Lead Channel Sensing Intrinsic Amplitude: 4 mV
Lead Channel Setting Pacing Amplitude: 2.5 V
Lead Channel Setting Pacing Pulse Width: 0.4 ms
MDC IDC MSMT LEADCHNL RV IMPEDANCE VALUE: 648 Ohm
MDC IDC MSMT LEADCHNL RV PACING THRESHOLD PULSEWIDTH: 0.4 ms
MDC IDC SET LEADCHNL RA PACING AMPLITUDE: 2 V
MDC IDC SET LEADCHNL RV SENSING SENSITIVITY: 2.8 mV
MDC IDC STAT BRADY AS VS PERCENT: 22 %

## 2013-04-07 NOTE — Progress Notes (Signed)
Patient Care Team: Noralee Space, MD as PCP - General (Pulmonary Disease)   HPI  Amy Lane is a 63 y.o. female is seen in followup for sinus bradycardia status post pacemaker, status post generator replacement and lead repair March 2011.  She also has a history of Ehlers-Danlos syndrome in the context of a marfanoid habitus, mitral valve prolapse status post mitral valve repair at the [facility] clinic with prior TIA on chronic Coumadin as well as syncope due to diagnosis of long QT syndrome. She has had significant dysautonomic symptoms consistent with POTS   The patient denies SOB, chest pain, edema or palpitations,  She is struggled with constipation and hyperphasia since being put on fluoxetine.  She has been under a great deal of stress with the loss of her father-in-law, her husband having prostate cancer and surgery. Recently she's feeling better.  She is intercurrently had a TIA. Is not clear to me that they need any adjustments are anticoagulation regime.     Past Medical History  Diagnosis Date  . Herpes zoster   . SVT (supraventricular tachycardia)   . PSVT (paroxysmal supraventricular tachycardia)   . PAT (paroxysmal atrial tachycardia)   . History of tachycardia-bradycardia syndrome   . Atrial fibrillation   . Dysautonomia   . History of syncope   . Cardiac pacemaker     DDD  MDT  . Long term (current) use of anticoagulants   . Allergic rhinitis   . Mitral valve prolapse   . Cerebrovascular disease   . Hypercholesteremia   . Hypothyroidism   . Hemorrhoids   . Ehler's-Danlos syndrome   . Fibromyalgia   . History of transient ischemic attack   . Depression   . Carcinomas, basal cell   . Skin desquamation     inflammative vaginitis  . Abnormal gait   . Hypercholesterolemia   . Headache(784.0)     Migraine    Past Surgical History  Procedure Laterality Date  . Complex mitral valve repair      at the [facility]  . Left carotid  endarterectomy  11/2003    by Dr. Delton See  . Pacemaker insertion      medtronic kappa 901  . Removed chalazion from left eye lid  09/2011    Dr. Satira Sark    Current Outpatient Prescriptions  Medication Sig Dispense Refill  . acebutolol (SECTRAL) 200 MG capsule Take 200 mg by mouth at bedtime.       Marland Kitchen aspirin EC 81 MG tablet Take 81 mg by mouth at bedtime.      . Cholecalciferol (VITAMIN D) 2000 UNITS tablet Take 2,000 Units by mouth every morning.      . ezetimibe (ZETIA) 10 MG tablet Take 1 tablet (10 mg total) by mouth daily.  30 tablet  5  . levothyroxine (SYNTHROID, LEVOTHROID) 75 MCG tablet Take 75 mcg by mouth daily before breakfast.      . LORazepam (ATIVAN) 1 MG tablet Take 1 tablet (1 mg total) by mouth at bedtime.  30 tablet  3  . polyethylene glycol (MIRALAX / GLYCOLAX) packet Take 17 g by mouth every evening.      . Probiotic Product (ALIGN) 4 MG CAPS Take 4 mg by mouth every morning.      . senna (SENOKOT) 8.6 MG TABS tablet Take 1 tablet by mouth at bedtime.      Marland Kitchen warfarin (COUMADIN) 5 MG tablet Take 5-7.5 mg by mouth daily at  6 PM. Take one and one-half tablets (7.5 mg) everyday except on Monday take one tablet (5 mg).       No current facility-administered medications for this visit.    Allergies  Allergen Reactions  . Other Other (See Comments)    All QTc prolongation drugs  . Statins Other (See Comments)    myalgias    Review of Systems negative except from HPI and PMH  Physical Exam BP 125/74  Pulse 40  Ht 5\' 10"  (1.778 m)  Wt 132 lb 12.8 oz (60.238 kg)  BMI 19.05 kg/m2 Well developed and well nourished in no acute distress HENT normal E scleral and icterus clear Neck Supple JVP flat; carotids brisk and full Clear to ausculation vRegular rate and rhythm, no murmurs gallops or rub Soft with active bowel sounds No clubbing cyanosis  Edema Alert and oriented, grossly normal motor and sensory function Skin Warm and Dry    Assessment and   Plan  Recurrent TIA  Mitral valve repair  Possible long QT  POTS/dysautonomia  Marfanoid habitus  Atrial fibrillation-paroxysmal  Pacemaker The patient's device was interrogated.  The information was reviewed. No changes were made in the programming.      the patient has had a subsequent TIA while on anticoagulation. Current recommendations would be to increase the INR target from 2-3>> 2.5--3.5. There is no role in this situation for aspirin. I don't know that aspirin is required related to her mitral valve ring.  Anticoagulation also be considered with a NOAC. I will defer these decisions to Dr. Johnsie Cancel

## 2013-04-07 NOTE — Patient Instructions (Signed)

## 2013-04-09 ENCOUNTER — Encounter: Payer: Self-pay | Admitting: Cardiovascular Disease

## 2013-04-09 LAB — PROTIME-INR: INR: 2.1 — AB (ref 0.9–1.1)

## 2013-04-11 ENCOUNTER — Ambulatory Visit (INDEPENDENT_AMBULATORY_CARE_PROVIDER_SITE_OTHER): Payer: No Typology Code available for payment source | Admitting: Pulmonary Disease

## 2013-04-11 ENCOUNTER — Encounter: Payer: Self-pay | Admitting: Pulmonary Disease

## 2013-04-11 VITALS — BP 120/74 | HR 66 | Temp 97.1°F | Ht 70.5 in | Wt 134.4 lb

## 2013-04-11 DIAGNOSIS — I495 Sick sinus syndrome: Secondary | ICD-10-CM

## 2013-04-11 DIAGNOSIS — I679 Cerebrovascular disease, unspecified: Secondary | ICD-10-CM

## 2013-04-11 DIAGNOSIS — I059 Rheumatic mitral valve disease, unspecified: Secondary | ICD-10-CM

## 2013-04-11 DIAGNOSIS — E039 Hypothyroidism, unspecified: Secondary | ICD-10-CM

## 2013-04-11 DIAGNOSIS — Q078 Other specified congenital malformations of nervous system: Secondary | ICD-10-CM

## 2013-04-11 DIAGNOSIS — Q796 Ehlers-Danlos syndrome, unspecified: Secondary | ICD-10-CM

## 2013-04-11 DIAGNOSIS — Z950 Presence of cardiac pacemaker: Secondary | ICD-10-CM

## 2013-04-11 DIAGNOSIS — E7800 Pure hypercholesterolemia, unspecified: Secondary | ICD-10-CM

## 2013-04-11 DIAGNOSIS — G459 Transient cerebral ischemic attack, unspecified: Secondary | ICD-10-CM

## 2013-04-11 NOTE — Progress Notes (Signed)
Subjective:    Patient ID: Amy Lane, female    DOB: October 28, 1950, 63 y.o.   MRN: [id number]  HPI 63 y/o WF here for a follow up visit... she has mult med problems as noted below... she is followed regularly by Drs Maryellen Pile for Cardiology, and Drs Jeri Modena for GI...  ~  October 13, 2009:  she was seen by Dr. Illene Bolus at the [facility] for her vulvar dermatitis & dryness & rec to use Crisco shortening Tid... she reports improved over the last 17mo- feeling better, in good spirits w/ the Lexapro helping, etc... she had f/u appts w/ DrKlein 6/11 & Cherly Hensen 7/11> both felt she was doing well- noted normal atrial pacing w/ long latency on EKG & they are following for now... due for f/u fasting labs & Flu shot today... mild dermatitis on legs- LidexE written...  ~  May 23, 2010:  48mo ROV & she reports feeling "the best I have in yrs" attributed to stopping her Lipitor "too many neuro issues and not enough benefit" per [place];  She is looking forward to [place] vacation w/ family this summer; she was able to stop the Lexapro last January & doing fine without it now...  She denies CP, palpit, dizziness, syncope, edema, or cerebral ischemic symptoms... Due for f/u fasting labs> TChol 218, LDL 135, and she will continue to control this w/ low chol/ low fat diet...  ~  November 21, 2010:  67mo ROV & she reports that while in [place] this past summer she developed a "blood clot" meaning DVT despite being on her Coumadin & INR was 2.7 prior to the trip (when rechecked in ?[place] it was 4.5)> left leg swelling, pain, doppler performed by physician in [place] was pos, they questioned poss PTE due to incr dyspnea, treated w/ Lovenox along the way & f/u protimes on her Coumadin, but didn't stop her travels;  When back home checked w/ Cherly Hensen- he thought bleed into muscle from prolonged INR of 4.5; she went to see DrNorris but he couldn't corroborate & treated her w/  compression hose;  Now back to baseline- swelling down, feeling better, back into exercise program & breathing better, followed in the Coumadin Clinic...    Also c/o URI, allergy symptoms, pain in ear & jaw> persistent discomfort treated w/ antihist, Nasonex, Pred, nasal saline, Celebrex, etc; DrByers planned tubes in her ears but she finally improved ~75% she says so she cancelled the tubes; they were going to send her to orthodontist vs oral surg for the TMJ & she cancelled that too...     We are going to check Fasting labs & give her the 2012 Flu vaccine today...  ~  May 22, 2011:  62mo ROV & [name] is in great spirits "everything is fine" and she is now doing home coumadin monitoring w/ INR goal 2.5-3.5 range "I love it" she says...     She saw Cherly Hensen in f/u 1/13 for her Ehlers-Danlos, marfanoid habitus, MVP w/ MV repair at [facility], hx TIA on chr coumadin, syncope due to long QT syndrome, & postural tachycardia syndrome (POTS); she had a left CAE 11/05 & a normal f/u duplex 11/11; doing satis & no changes made...    She saw DrKlein 3/13 for f/u SAnode dysfunction (bradycardia), PAFib, Pacer> she had last generator replacement & a lead repair 3/11; she is stable w/o recurrent AFib & pacer working well...    She has been  followed in the [place] currently taking Niacin500mg /d; last FLP 2/13 showed TChol 199, TG 55, HDL 76, LDL 112; much improved & they decided to continue same meds as is; her weight is a bit low however at 124#, 70" tall, and BMI~18; we discussed supplements to help her gain a few lbs...    She has followed up w/ GYN & diagnosed w/ desquamative inflammatory vaginitis (DIV) & inserting Vagifem tabs MWF; I wondered about an assoc w/ her prev dx of kraurosis vulvae & was seen at the [facility] vulva clinic by Dr. Illene Bolus; she is interested in a second opinion regarding her current problem 7 will call for f/u appt at [facility]...    She also reports a skin lesion removed from the back of  her neck by DrLupton & then wider excision was carried out; [facility] in [facility] indicates it was a basal cell cancer & she is doing fine at present...  ~  November 21, 2011:  56mo ROV & Pat has had two signif issues over the last few months>  1) presented to the ER 6/13 w/ an unusual event- sudden blurred vision, followed by transient paralysis of all 4 extremities & aphasia, evolved over <1H, denies HA; recall hx Ehlers-Danlos, MV repair, left CAE, & hx migraines in the past; in ER CTBrain was neg/ NAD (vasc calcif noted); CTA of head & neck showed mild arteriosclerotic changes, no stenoses, no aneurysms, etc (no change from 1/12 study); pt on Coumadin (does her own Protime checks at home w/& INR was 2.2... She was referred to DrWillis who felt this was prob a Migraine equivalent- he did an EEG which was normal; no change in therapy warranted & symptoms cleared & have not returned...  2) she has had some mod depression stemming from marital discord & she was getting counseling from [employer]; called 9/13 w/ request for antidepressant & we prescribed Prozac20 which she feels has been very helpful; they have had marital counseling as well & doing better at the moment she says...    Since her last visit she is eating better & has gained 6# up to 130#... Notes she is intol to all statins & had been on Niasp, off now on diet alone...    We reviewed prob list, meds, xrays and labs> see below for updates >> Given 2013 Flu vaccine...   LABS 11/13:  FLP- not at goals & we will refer to [facility];  Chems- wnl;  CBC- wnl;  TSH=1.55   ~  May 21, 2012:  48mo ROV & Pat has had a good interval- notes some recent allergy symptoms but OTC Benedryl is helping; she is also c/o some constipation & we discussed regular dosing w/ Miralax & Senakot-S; also notes some "clumsiness, weakness in legs, and balance off" stating this has been off & on for yrs & she would like further eval- refer to Neuro per request... We reviewed the  following medical problems during today's office visit >>     AR> on Benedryl & Nasonex prn; breathing is good w/o cough, sput, SOB, etc...    Hx Dysautonomia (POTS) & Ehlers-Danlos syndrome> followed by Cherly Hensen & DrKlein; stable on current meds...    MVP, Myxomatous valve- s/p MVrepair (in [place])> on ASA81; she is followed by DrKlein- seen 3/14 & stable pacer & Coumadin, no changes made...    Hx Tachy-brady, Fib-flutter w/ ablation & pacer> on Sectral200/d, & Coumadin; she does her own Protimes at home & called to  the CC for adjustments...     Cerebrovasc Dis> s/p left CAE 2005 by [facility] for asymptomatic left carotid stenosis w/ ulceration, & f/u CDopplers all ok...    ?DVT & ?PE while on Coumadin (during trip to [place])> she now checks her own protimes like they do in [place]...    CHOL> on Niacin500; FLP 4/14 shows TChol 233, TG 78, HDL 75, LDL 146; she is intol to all meds- offered lipid clinic referral...    Hypothy> on Synthroid75; lab 4/14 shows TSH= 3.10; continue same meds...    GI- Constip & Hems> on Align; c/o constip & asked to start Miralax, Senakot-S, added fiber; last colon 2009 by DrJacobs w/ hems only & f/u planned 39yrs...    GYN- kraurrosis vulvae (DIV)> local GYN, [facility] vulva clinic treatment & she is up to date on screening...    Migraine HAs & ?TIA> she is followed by [facility] Neuro- DrWillis...    Anxiety/ Depression> on Ativan 1mg  prn & off prev Prozac20 (it was really helping but "I'm past that now" & she felt this contrib to incr appetite & wt gain (Note- 130# & BMI=18 but she states "I'm up 3 pant sizes")... We reviewed prob list, meds, xrays and labs> see below for updates >>   LABS 4/14:  FLP- not at goals on diet alone but intol to all meds;  Chems- wnl;  CBC- wnl;  TSH=3.10;  VitD=19 & Rec VitD OTC supplement ~2000u daily...  ~  April 11, 2013:  51mo ROV & Pat was [facility] 1/6 - 01/30/13 by [facility] w/ HA & altered mental status- ?TIA vs complex migraine- CT Head was  neg; CDopplers were ok; 2DEcho showed norm LVF, myxomatous MV s/p repair no resid MR; Coumadin was continued and ASA81 was added... She had ROV DrWillis 2/15> he felt the episode was a migraine equiv & no recurrent prob since then, she notes mild memory impairment w/ MMSE= 29/30... She notes that she's been under a lot of stress w/ father-in-law illness, husb w/ prostate ca, sold house & had to move... Now feeling much better w/ great attitude- "today is a great day"... We reviewed the following medical problems during today's office visit >>     AR> on Benedryl & Nasonex prn; breathing is good w/o cough, sput, SOB, etc...    Hx Dysautonomia (POTS) & Ehlers-Danlos syndrome> followed by Cherly Hensen & DrKlein; on ASA81 + Coumadin but talking about switch to NOAC...    MVP, Myxomatous valve- s/p MVrepair (in [place])> on ASA81, Coumadin; she is followed by DrKlein- seen 3/15 & stable pacer & no changes made...    Hx Tachy-brady, Fib-flutter w/ ablation & pacer> on Sectral200/d, & Coumadin; she does her own Protimes at home & called to the CC for adjustments...     Cerebrovasc Dis> on ASA81, s/p left CAE 2005 by [facility] for asymptomatic left carotid stenosis w/ ulceration, & f/u CDopplers all ok...    ?DVT & ?PE while on Coumadin (during trip to [place])> she now checks her own protimes like they do in [place]...    CHOL> on Zetia10 since 1/15 Hosp where LDL was 155; FLP on Zetia10 3/15 showed TChol 172, TG 74, HDL 59, LDL 89; she is intol to all other meds...    Hypothy> on Synthroid75; lab 3/15 shows TSH= 4.99; continue same meds, take pill first thing in morn, etc...    GI- Constip & Hems> on Align; c/o constip & asked to start Miralax, Senakot-S, added fiber; last colon  2009 by DrJacobs w/ hems only & f/u planned 64yrs...    GYN- kraurosis vulvae (DIV)> local GYN, [facility] vulva clinic treatment & she is up to date on screening...    Migraine HAs & ?TIA> she is followed by [facility] Neuro- DrWillis; Hosp 1/15  w/ another episode, symptoms cleared, ASA81 added to her Coumadin...    Anxiety/ Depression> on Ativan 1mg  prn & off prev Prozac20 (it was really helping but "I'm past that now" & she felt this contrib to incr appetite & wt gain (Note- 134# & BMI=18 but she states "I'm up 3 pant sizes")... We reviewed prob list, meds, xrays and labs> see below for updates >>            Problem List:      ALLERGIC RHINITIS (ICD-477.9) - we discussed Rx w/ Zyrtek, Astepro Prn, etc... ~  CXR 3/11 showed left pacer, sternal wires, NAD.Marland Kitchen. ~  CXR 6/13 showed stable post-op changes and pacer, pulm hyperinflation & scattered scarringt, calcif mediastinal nodes, no focal opacities, NAD.Marland Kitchen. ~  CXR 1/15 showed norm heart size, post op changes & pacer, no adenop, no edema, clear x some left apical pleural thickening w/o change...  MITRAL VALVE PROLAPSE (ICD-424.0) - followed by Cherly Hensen... ~  2DEcho 11/08 showed myxomatous MV, annuloplasty ring, decr post leaflet excursion, norm LVH & wall motion... ~  NuclearStressTest 11/08 showed mild apical thinning... prev study 12/04 w/o ischemia or infarct & EF=68%... ~  she sees [place] every 6 months- for f/u MVP (s/p MV repair at [facility]), dysautonomia (s/p pacer), & Ehlers-Danlos Syndrome> last seen 1/15 & note reviewed...  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 hx AFib/Flutter w/ ablation & pacemaker placed ... she has Ehlers-Danlos synd w/ prolonged QT interval...  CARDIAC PACEMAKER IN SITU (ICD-V45.01) - f/u DrKlein w/ CTChest 4/09- neg. ~  dual chamber pacer changed 3/11 by DrKlein for end-of-life... ~  EKG shows NSR, 1st degree AVB, RBBB... ~  3/15: she had f/u DrKlein> note reviewed, pacer functioning well- no changes made but he wanted her to consider a NOAC...  DYSAUTONOMIA (ICD-742.8) - prev on Proamatine 5mg  tabs per DrKlein, but this was stopped...  CEREBROVASCULAR DISEASE (ICD-437.9) - on ASA 81mg /d  & COUMADIN (followed in the clinic)... ~  she had a Left CAE 11/05 by [facility] for symptomatic left carotid stenosis w/ ulceration... ~  Carotid angiogram 5/08 by [facility] showed <30% right carotid stenosis ~  f/u CTA of Head & Neck 12/09 was neg= norm CTA head, & no signif carotid dis noted in neck. ~  CDoppler 2/11 showed stable mild carotid dis, left CAE w/ DPA is patent, 0-39% bilat ICA stenoses. ~  repear CDopplers 11/11 per Cherly Hensen showed smooth plaque in right bulb & distal left CCA- stable; 0-39% bilat ICA stenoses... ~  CT Head 6/13 is neg- No acute intracranial abnormalities... ~  CTA Brain 6/13 showed mild atherosclerotic dis in the carotid siphon regions bilat w/o stenoses, otherw norm CTA brain w/o lesions... ~  CT Angio Neck 6/13 via ER showed mild nonstenotic arteriosclerotic dis at both carotid bifurcations w/o signif stenoses... ~  CDopplers 11/13 showed mild soft smooth plaque w/ 0-39% right ICAstenosis & 40-59% left ICA stenosis, vertebrals are antegrade...  ?DVT & poss PTE while on vacation in [place] during the summer of 2012 >> pt was on Coumadin & INR was therapeutic; she had pain & swelling in left leg w/ a pos doppler reported by a [place]  physician; she was also SOB & they questioned poss PTE but didn't get scan or further eval;  On return to [place] DrNishan wondered if she might have bled into the leg muscles & sent her to DrNorris for Ortho eval; he could not corroborate the theory & treated her w/ compression stockings & she gradually improved towards her baseline...  HYPERCHOLESTEROLEMIA (ICD-272.0) - on diet alone now, prev on Lip20- this was stopped 1/12 by Cherly Hensen & she feels much better off this med! ~  [place] 05/13/07 on Lip20 showed TChol 119, TG 50, HDL 55, LDL 54 ~  FLP 5/10 on Lip20 showed TChol 120, TG 43, HDL 58, LDL 54 ~  FLP 9/11 on Lip20 showed TChol 130, TG 54, HDL 59, LDL 60 ~  FLP 5/12 on diet alone showed TChol 218, TG 91, HDL 57, LDL 135... Continue low  chol, low fat diet. ~  FLP 10/12 showed TChol 231, TG 86, HDL 68, LDL 151... I rec the Lipid CLinic, & they started NIACIN 500mg /d. ~  FLP 2/13 on Niacin500 showed TChol 199, TG 55, HDL 76, LDL 112... Much improved, continue same. ~  FLP 11/13 on diet alone showed TChol 231, TG 49, HDL 67, LDL 151..,. rec refer to Lipid clinic. ~  [place] 4/14 on diet + Niacin showed TChol 233, TG 78, HDL 75, LDL 146  ~  FLP 1/15 in hosp showed Tchol 235, TG 79, HDL 64, LDL 155 => ZETIA 10mg /d started... ~  [place] 3/15 on Zetia10 showed TChol 172, TG 74, HDL 59, LDL 89... Continue same...  HYPOTHYROIDISM (ICD-244.9) - currently on SYNTHROID 46mcg/d... ~  labs 4/09 on Levoth88 showed TSH = 0.10... rec to decr Synthroid to 31mcg/d... ~  labs 9/09 on Levoth75 showed TSH = 1.67 ~  labs 5/10 on Levoth75 showed TSH = 2.92... Continue same. ~  labs 9/11 on Levoth75 showed TSH= 1.37 ~  Labs 5/12 on Levothy75 showed TSH= 4.33 ~  Labs 10/12 on Levothy75 showed TSH= 1.51... Continue same. ~  Labs 11/13 on Levothy75 showed TSH= 1.55 ~  Labs 4/14 on Levothy75 showed TSH= 3.10 ~  Labs 3/15 on Levothy75 showed TSH= 4.99... Reminded to take every AM on empty stomach etc.  HEMORRHOIDS (ICD-455.6) - last colonoscopy 2/09 by DrJacobs showed only sm hems... f/u 18yrs.  GYN - followed by DrMcPhail and Dx w/ kraurosis vulvae 7/10... Prev on Premarin VagCream. ~  11/10: she discussed the Dx w/ me & will inquire about poss hyst/ BSO... ~  4/11:  eval in the [facility] by Dr Illene Bolus showed vulvar dermatitis & dryness w/ rec for "Crisco" Tid! ~  4/13:  She reports that local GYN diagnosed desquamative inflammatory vaginitis (DIV) & she is currently using VagifemMWF; she indicates that she will f/u w/ [facility].   FIBROMYALGIA (ICD-729.1) - she has been doing Yoga classes and this has helped... ~  She saw DrNorris 9/14 w/ neck pain> CT CSpine showed degen disc dis & he rec PT...  EHLERS-DANLOS SYNDROME (ICD-756.83) - mult  manifestations as noted...  Hx of TRANSIENT ISCHEMIC ATTACK (ICD-435.9) - on ASA 81mg /d & COUMADIN> she has been followed by DrWillis; s/p left carotid endarterectomy 11/05 by [facility]. ~  1/12: f/u eval by DrWillis reviewed... ~  6/13: she presented w/ ?TIA symptoms & eval was neg; review by DrWillis indicated prob Migraine & everything resolved... ~  1/15: she had another episode, Hosp by [facility] x2d but felt to prob be Migraine related...  Hx of  SYNCOPE (ICD-780.2) - felt to be neurally mediated, ? related to long QT, no recur since pacer placed...  MIGRAINE HEADACHE >> she presented w/ ?TIA symptoms 6/13 & eval was neg; review by DrWillis indicated prob Migraine & everything resolved, no recurrence...  DEPRESSION (ICD-311) - she takes LORAZEPAM 1mg Qhs for insomnia... she prev saw DrGraves for counselling in [place]... ~  11/10: poor appetite & some wt loss prob related to depression & we discussed trial Lexapro 10mg /d... ~  3/11: she is very pleased- feels better, gained 5# w/ Ensure, etc... ~  9/11:  "I feel great" & Lexapro continued... ~  She was able to stop the Lexapro 1/12 (also stopped the Lipitor) & has done well off it ever since... ~  10/13:  She called w/ situational depression due to marital stress, getting counseling, etc; started PROZAC 20mg /d & she reports much improved... ~  2015: she is off the Prozac and using ativan 1mg  prn...  CARCINOMA, BASAL CELL (ICD-173.9) - skin cancer removed from left leg by [facility] 2008. ~  12/11:  She reports bx of rash on legs= lichen planus per DrHall, treated w/ topical ointment... ~  2/13:  She had a skin lesion removed from her neck= basal cell ca & wide excision performed by DrLupton.  Health Maintenance: ~  GI:  followed by DrJacobs w/ colonoscopy 2/09 showing only sm hems... ~  GYN:  followed by DrMcPhail, and Dr. Illene Bolus at [place] [facility]... ~  Labs 9/11 showed Vit D level = 39 & rec to start 1000 u OTC supplement... ~   Immunizations:  she had TDAP 3/11... she gets yearly Flu vaccine in the Fall of the yr...    Past Surgical History  Procedure Laterality Date  . Complex mitral valve repair      at the [facility]  . Left carotid endarterectomy  11/2003    by Dr. Delton See  . Pacemaker insertion      medtronic kappa [phone]  . Removed chalazion from left eye lid  09/2011    Dr. Satira Sark    Outpatient Encounter Prescriptions as of 04/11/2013  Medication Sig  . acebutolol (SECTRAL) 200 MG capsule Take 200 mg by mouth at bedtime.   Marland Kitchen aspirin EC 81 MG tablet Take 81 mg by mouth at bedtime.  . Cholecalciferol (VITAMIN D) 2000 UNITS tablet Take 2,000 Units by mouth every morning.  . ezetimibe (ZETIA) 10 MG tablet Take 1 tablet (10 mg total) by mouth daily.  Marland Kitchen levothyroxine (SYNTHROID, LEVOTHROID) 75 MCG tablet Take 75 mcg by mouth daily before breakfast.  . LORazepam (ATIVAN) 1 MG tablet Take 1 tablet (1 mg total) by mouth at bedtime.  . polyethylene glycol (MIRALAX / GLYCOLAX) packet Take 17 g by mouth every evening.  . Probiotic Product (ALIGN) 4 MG CAPS Take 4 mg by mouth every morning.  . senna (SENOKOT) 8.6 MG TABS tablet Take 1 tablet by mouth at bedtime.  Marland Kitchen warfarin (COUMADIN) 5 MG tablet Take 5-7.5 mg by mouth daily at 6 PM. Take one and one-half tablets (7.5 mg) everyday except on Monday take one tablet (5 mg).    Allergies  Allergen Reactions  . Other Other (See Comments)    All QTc prolongation drugs  . Statins Other (See Comments)    myalgias    Current Medications, Allergies, Past Medical History, Past Surgical History, Family History, and Social History were reviewed in [employer] record.    Review of Systems  See HPI - all other systems neg except as noted... The patient complains of dyspnea on exertion.  The patient denies anorexia, fever, weight loss, weight gain, vision loss, decreased hearing, hoarseness, chest pain, syncope, peripheral edema,  prolonged cough, headaches, hemoptysis, abdominal pain, melena, hematochezia, severe indigestion/heartburn, hematuria, incontinence, muscle weakness, suspicious skin lesions, transient blindness, difficulty walking, depression, unusual weight change, abnormal bleeding, enlarged lymph nodes, and angioedema.     Objective:   Physical Exam     WD, Thin, 63 y/o WF in NAD... she is 5'11"Tall and 141# = BMI~20... Vital Signs:  Reviewed... GENERAL:  Alert & oriented; pleasant & cooperative... HEENT:  [place]/AT, EOM-wnl, PERRLA, EACs-clear, TMs-wnl, NOSE-clear, THROAT-clear & wnl. NECK:  Supple w/ fair ROM; no JVD; normal carotid impulses w/o bruits, left CAE scar; no thyromegaly or nodules palpated; no lymphadenopathy. CHEST:  Clear to P & A; without wheezes/ rales/ or rhonchi heard... HEART:  Regular Rhythm; pacer on left, without murmurs/ rubs/ or gallops detected... ABDOMEN:  Soft & nontender; normal bowel sounds; no organomegaly or masses palpated... EXT: without deformities or arthritic changes; no varicose veins/ venous insuffic/ or edema. NEURO:  CN's intact;  no focal neuro deficits... DERM:  s/p skin cancer removed from left leg; min rash noted...  RADIOLOGY DATA:  Reviewed in the [facility] EMR & discussed w/ the patient...  LABORATORY DATA:  Reviewed in the EPIC EMR & discussed w/ the patient...   Assessment & Plan:    MVP/ MV Repair> Tachy-Brady/ PACER> Dysautonomia>  Followed by Cherly Hensen & DrKlein, doing well on Coumadin (with home monitoring) & Sectral, continue same...  Cerebrovasc Dis>  Also takes ASA 81mg /d, s/p left CAE 11/05, no recurrent erebral ischemic symptoms...  ?DVT while in [place]> moot issue at present; stable on her Coumadin via CC; leg swelling diminished on the compression hose, no salt, elevation, etc; she is back to her exercise program & back to baseline...  CHOL>  She feels much better off the Lipitor & FLP improved w/ Zetia10 started 1/15 [facility]- continue  same...  Hypothyroid>  Stable on the Synthroid 14mcg/d...  GYN>  Stable & followed both here & at [facility] (see above)...  EHLERS-DANLOS>  Aware, stable, she has been feeling better & in great spirits...  Migraines> Hosp 6/13 & 1/15 w/ ?TIA but DrWillis thought likely Migraine syndrome- resolved, neg work up, no recurrence & they are following... Neuro> she mentions several symtoms that she is concerned about- clumsy, balance off, subjective leg weakness- & she is concerned; therefore refer to Neurology for eval...  Depression>  Off Prozac20 & on Ativan prn; she & husb received counseling...  Other medical problems as noted...   Patient's Medications  New Prescriptions   APIXABAN (ELIQUIS) 5 MG TABS TABLET    Take 1 tablet (5 mg total) by mouth 2 (two) times daily.   CITALOPRAM (CELEXA) 20 MG TABLET    Start with 1 daily ---may increase to 2 daily if necessary  Previous Medications   CHOLECALCIFEROL (VITAMIN D) 2000 UNITS TABLET    Take 2,000 Units by mouth every morning.   LORAZEPAM (ATIVAN) 1 MG TABLET    Take 1 tablet (1 mg total) by mouth at bedtime.   POLYETHYLENE GLYCOL (MIRALAX / GLYCOLAX) PACKET    Take 17 g by mouth every evening.   PROBIOTIC PRODUCT (ALIGN) 4 MG CAPS    Take 4 mg by mouth every morning.   SENNA (SENOKOT) 8.6 MG TABS TABLET    Take 1 tablet by mouth at  bedtime.  Modified Medications   Modified Medication Previous Medication   ACEBUTOLOL (SECTRAL) 200 MG CAPSULE acebutolol (SECTRAL) 200 MG capsule      Take 1 capsule (200 mg total) by mouth at bedtime.    Take 200 mg by mouth at bedtime.    EZETIMIBE (ZETIA) 10 MG TABLET ezetimibe (ZETIA) 10 MG tablet      Take 1 tablet (10 mg total) by mouth daily.    Take 1 tablet (10 mg total) by mouth daily.   LEVOTHYROXINE (SYNTHROID, LEVOTHROID) 75 MCG TABLET levothyroxine (SYNTHROID, LEVOTHROID) 75 MCG tablet      TAKE 1 TABLET BY MOUTH EVERY DAY    Take 75 mcg by mouth daily before breakfast.  Discontinued Medications    ASPIRIN EC 81 MG TABLET    Take 81 mg by mouth at bedtime.   WARFARIN (COUMADIN) 5 MG TABLET    Take 5-7.5 mg by mouth daily at 6 PM. Take one and one-half tablets (7.5 mg) everyday except on Monday take one tablet (5 mg).

## 2013-04-11 NOTE — Patient Instructions (Signed)
Today we updated your med list in our EPIC system...    Continue your current medications the same...  Please return to our lab one morning next week for your follow up FASTING blood work...    We will contact you w/ the results when available...   Call for any questions...  Let's plan a follow up visit in 57mo as we discussed, sooner if needed for problems.Marland KitchenMarland Kitchen

## 2013-04-16 ENCOUNTER — Other Ambulatory Visit: Payer: Self-pay | Admitting: Adult Health

## 2013-04-16 ENCOUNTER — Encounter: Payer: Self-pay | Admitting: Internal Medicine

## 2013-04-16 LAB — PROTIME-INR: INR: 2.1 — AB (ref 0.9–1.1)

## 2013-04-16 MED ORDER — EZETIMIBE 10 MG PO TABS: 10.0000 mg | ORAL_TABLET | Freq: Every day | ORAL | Status: DC

## 2013-04-16 NOTE — Telephone Encounter (Signed)
Received fax from [facility] requesting 90 day supply for pt's Zetia 10mg  in order for insurance to pay Pt last seen 3.20.15 by SN Zetia last filled at 1.19.15 HFU with TP  Rx resent to pharmacy for #90

## 2013-04-18 ENCOUNTER — Other Ambulatory Visit (INDEPENDENT_AMBULATORY_CARE_PROVIDER_SITE_OTHER): Payer: No Typology Code available for payment source

## 2013-04-18 DIAGNOSIS — E7800 Pure hypercholesterolemia, unspecified: Secondary | ICD-10-CM

## 2013-04-18 DIAGNOSIS — E039 Hypothyroidism, unspecified: Secondary | ICD-10-CM

## 2013-04-18 LAB — TSH: TSH: 4.99 u[IU]/mL (ref 0.35–5.50)

## 2013-04-18 LAB — LIPID PANEL
Cholesterol: 172 mg/dL (ref 0–200)
HDL: 68.7 mg/dL (ref >39.00)
LDL Cholesterol: 89 mg/dL (ref 0–99)
Total CHOL/HDL Ratio: 3
Triglycerides: 74 mg/dL (ref 0.0–149.0)
VLDL: 14.8 mg/dL (ref 0.0–40.0)

## 2013-04-21 ENCOUNTER — Telehealth: Payer: Self-pay | Admitting: Internal Medicine

## 2013-04-21 DIAGNOSIS — Z79899 Other long term (current) drug therapy: Secondary | ICD-10-CM

## 2013-04-21 NOTE — Telephone Encounter (Signed)
New message     Saw Dr Caryl Comes on 04-07-13.  He wanted to change coumadin to another drug.  He was going to discuss it with Dr Johnsie Cancel first. She has not heard from our office as to whether this will happen.  Please call with an update.

## 2013-04-23 MED ORDER — APIXABAN 5 MG PO TABS: 5.0000 mg | ORAL_TABLET | Freq: Two times a day (BID) | ORAL | Status: DC

## 2013-04-23 NOTE — Telephone Encounter (Signed)
Informed pt that Dr. Caryl Comes is defering NOAC decision to Dr. Johnsie Cancel.  Forwarding to Dr. Johnsie Cancel and his nurse to address. Pt understandable of plan.

## 2013-04-23 NOTE — Telephone Encounter (Signed)
PER  DR NISHAN  PT  NEEDS  TO HAVE   CBC  AND  BMET   DONE  AND  IF LABS  OKAY   PT THEN    MAY   STOP COUMADIN AND  START  ELIQUIS   5 MG  BID    PT TO COME IN MON AM  FOR BLOOD TEST  .Adonis Housekeeper

## 2013-04-28 ENCOUNTER — Other Ambulatory Visit (INDEPENDENT_AMBULATORY_CARE_PROVIDER_SITE_OTHER): Payer: No Typology Code available for payment source

## 2013-04-28 DIAGNOSIS — Z79899 Other long term (current) drug therapy: Secondary | ICD-10-CM

## 2013-04-28 LAB — CBC WITH DIFFERENTIAL/PLATELET
Basophils Absolute: 0 10*3/uL (ref 0.0–0.1)
Basophils Relative: 0.6 % (ref 0.0–3.0)
Eosinophils Absolute: 0.1 10*3/uL (ref 0.0–0.7)
Eosinophils Relative: 2.2 % (ref 0.0–5.0)
HCT: 38 % (ref 36.0–46.0)
HEMOGLOBIN: 12.9 g/dL (ref 12.0–15.0)
LYMPHS PCT: 28.6 % (ref 12.0–46.0)
Lymphs Abs: 1.1 10*3/uL (ref 0.7–4.0)
MCHC: 33.9 g/dL (ref 30.0–36.0)
MCV: 92 fl (ref 78.0–100.0)
MONOS PCT: 9.3 % (ref 3.0–12.0)
Monocytes Absolute: 0.4 10*3/uL (ref 0.1–1.0)
NEUTROS PCT: 59.3 % (ref 43.0–77.0)
Neutro Abs: 2.3 10*3/uL (ref 1.4–7.7)
Platelets: 155 10*3/uL (ref 150.0–400.0)
RBC: 4.13 Mil/uL (ref 3.87–5.11)
RDW: 13.2 % (ref 11.5–14.6)
WBC: 3.9 10*3/uL — AB (ref 4.5–10.5)

## 2013-04-28 LAB — BASIC METABOLIC PANEL
BUN: 15 mg/dL (ref 6–23)
CHLORIDE: 100 meq/L (ref 96–112)
CO2: 28 meq/L (ref 19–32)
Calcium: 9.3 mg/dL (ref 8.4–10.5)
Creatinine, Ser: 0.7 mg/dL (ref 0.4–1.2)
GFR: 89.04 mL/min (ref >60.00)
Glucose, Bld: 92 mg/dL (ref 70–99)
POTASSIUM: 4 meq/L (ref 3.5–5.1)
Sodium: 135 mEq/L (ref 135–145)

## 2013-05-19 ENCOUNTER — Telehealth: Payer: Self-pay | Admitting: Cardiovascular Disease

## 2013-05-19 NOTE — Telephone Encounter (Signed)
PT  AWARE  WILL NOT GET   AN  ACCURATE  INR  READING   SINCE  TAKING  ELIQUIS  PER  JEREMY  MACHINE MEASURES ONLY  FOR  COUMADIN./CY

## 2013-05-19 NOTE — Telephone Encounter (Signed)
New message      Pt is on eliquis.  Her INR is 1.7.  She has her own machine at home.  She wanted to check it before she turned her machine in.----Not sure who to send this to---coumadin clinic or the nurse since you do not check INR while patients are on eliquis.

## 2013-05-25 ENCOUNTER — Other Ambulatory Visit: Payer: Self-pay | Admitting: Pulmonary Disease

## 2013-05-30 ENCOUNTER — Ambulatory Visit (INDEPENDENT_AMBULATORY_CARE_PROVIDER_SITE_OTHER): Payer: No Typology Code available for payment source | Admitting: Professional

## 2013-05-30 DIAGNOSIS — F331 Major depressive disorder, recurrent, moderate: Secondary | ICD-10-CM

## 2013-06-03 ENCOUNTER — Ambulatory Visit (INDEPENDENT_AMBULATORY_CARE_PROVIDER_SITE_OTHER): Payer: No Typology Code available for payment source | Admitting: Professional

## 2013-06-03 DIAGNOSIS — F331 Major depressive disorder, recurrent, moderate: Secondary | ICD-10-CM

## 2013-06-06 ENCOUNTER — Telehealth: Payer: Self-pay | Admitting: Pulmonary Disease

## 2013-06-06 NOTE — Telephone Encounter (Signed)
Called spoke with pt. She reports she has been seen [facility] for couple weeks. She is feeling depressed and is wanting Sn to call her in anti depressant in. She is aware he is not back until Monday and is fine with this. She does not go back and see [facility] for another 2 weeks. Please advise SN thanks  Allergies  Allergen Reactions  . Other Other (See Comments)    All QTc prolongation drugs  . Statins Other (See Comments)    myalgias     Current Outpatient Prescriptions on File Prior to Visit  Medication Sig Dispense Refill  . acebutolol (SECTRAL) 200 MG capsule Take 200 mg by mouth at bedtime.       Marland Kitchen apixaban (ELIQUIS) 5 MG TABS tablet Take 1 tablet (5 mg total) by mouth 2 (two) times daily.  60 tablet  1  . Cholecalciferol (VITAMIN D) 2000 UNITS tablet Take 2,000 Units by mouth every morning.      . ezetimibe (ZETIA) 10 MG tablet Take 1 tablet (10 mg total) by mouth daily.  90 tablet  1  . levothyroxine (SYNTHROID, LEVOTHROID) 75 MCG tablet TAKE 1 TABLET BY MOUTH EVERY DAY  90 tablet  1  . LORazepam (ATIVAN) 1 MG tablet Take 1 tablet (1 mg total) by mouth at bedtime.  30 tablet  3  . polyethylene glycol (MIRALAX / GLYCOLAX) packet Take 17 g by mouth every evening.      . Probiotic Product (ALIGN) 4 MG CAPS Take 4 mg by mouth every morning.      . senna (SENOKOT) 8.6 MG TABS tablet Take 1 tablet by mouth at bedtime.       No current facility-administered medications on file prior to visit.

## 2013-06-09 MED ORDER — CITALOPRAM HYDROBROMIDE 20 MG PO TABS
ORAL_TABLET | ORAL | Status: DC
Start: 2013-06-09 — End: 2013-07-07

## 2013-06-09 NOTE — Telephone Encounter (Signed)
Pt aware of recs. RX sent in. Nothing further needed 

## 2013-06-09 NOTE — Telephone Encounter (Signed)
Per SN---  Ok to call in generic celexa 20 mg  Start with 1 daily ---may increase to 2 daily if necessary.   thanks

## 2013-06-10 ENCOUNTER — Other Ambulatory Visit: Payer: Self-pay

## 2013-06-10 ENCOUNTER — Telehealth: Payer: Self-pay | Admitting: Cardiovascular Disease

## 2013-06-10 MED ORDER — ACEBUTOLOL HCL 200 MG PO CAPS: 200.0000 mg | ORAL_CAPSULE | Freq: Every day | ORAL | Status: DC

## 2013-06-17 ENCOUNTER — Ambulatory Visit: Payer: No Typology Code available for payment source | Admitting: Professional

## 2013-06-24 ENCOUNTER — Ambulatory Visit (INDEPENDENT_AMBULATORY_CARE_PROVIDER_SITE_OTHER): Payer: No Typology Code available for payment source | Admitting: Professional

## 2013-06-24 DIAGNOSIS — F331 Major depressive disorder, recurrent, moderate: Secondary | ICD-10-CM

## 2013-07-01 ENCOUNTER — Ambulatory Visit (INDEPENDENT_AMBULATORY_CARE_PROVIDER_SITE_OTHER): Payer: No Typology Code available for payment source | Admitting: Professional

## 2013-07-01 DIAGNOSIS — F331 Major depressive disorder, recurrent, moderate: Secondary | ICD-10-CM

## 2013-07-07 ENCOUNTER — Other Ambulatory Visit: Payer: Self-pay | Admitting: Pulmonary Disease

## 2013-07-08 ENCOUNTER — Other Ambulatory Visit: Payer: Self-pay

## 2013-07-08 MED ORDER — APIXABAN 5 MG PO TABS: 5.0000 mg | ORAL_TABLET | Freq: Two times a day (BID) | ORAL | Status: DC

## 2013-07-09 ENCOUNTER — Ambulatory Visit (INDEPENDENT_AMBULATORY_CARE_PROVIDER_SITE_OTHER): Payer: No Typology Code available for payment source | Admitting: *Deleted

## 2013-07-09 DIAGNOSIS — I495 Sick sinus syndrome: Secondary | ICD-10-CM

## 2013-07-09 LAB — MDC_IDC_ENUM_SESS_TYPE_REMOTE
Battery Remaining Longevity: 120 mo
Battery Voltage: 2.8 V
Brady Statistic AP VS Percent: 73.2 %
Brady Statistic AS VP Percent: 0.1 %
Brady Statistic AS VS Percent: 26.6 %
Lead Channel Impedance Value: 455 Ohm
Lead Channel Impedance Value: 612 Ohm
Lead Channel Pacing Threshold Amplitude: 0.875 V
Lead Channel Pacing Threshold Pulse Width: 0.4 ms
Lead Channel Sensing Intrinsic Amplitude: 1.4 mV
Lead Channel Sensing Intrinsic Amplitude: 5.6 mV
Lead Channel Setting Pacing Pulse Width: 0.4 ms
MDC IDC MSMT LEADCHNL RV PACING THRESHOLD AMPLITUDE: 0.875 V
MDC IDC MSMT LEADCHNL RV PACING THRESHOLD PULSEWIDTH: 0.4 ms
MDC IDC SET LEADCHNL RA PACING AMPLITUDE: 2 V
MDC IDC SET LEADCHNL RV PACING AMPLITUDE: 2.5 V
MDC IDC SET LEADCHNL RV SENSING SENSITIVITY: 2.8 mV
MDC IDC STAT BRADY AP VP PERCENT: 0.1 %

## 2013-07-09 NOTE — Progress Notes (Signed)
Remote pacemaker transmission.   

## 2013-07-15 ENCOUNTER — Ambulatory Visit: Payer: No Typology Code available for payment source | Admitting: Professional

## 2013-07-21 ENCOUNTER — Other Ambulatory Visit: Payer: Self-pay | Admitting: Pulmonary Disease

## 2013-07-24 NOTE — Telephone Encounter (Signed)
Pt last OV 04/11/13 Last refilled 04/02/13 #30 x 3 refills Has pending appt 10/06/13 Please advise regarding refill thanks

## 2013-07-29 ENCOUNTER — Encounter: Payer: Self-pay | Admitting: Cardiology

## 2013-07-31 ENCOUNTER — Telehealth: Payer: Self-pay | Admitting: Pulmonary Disease

## 2013-07-31 ENCOUNTER — Encounter: Payer: Self-pay | Admitting: Cardiology

## 2013-07-31 NOTE — Telephone Encounter (Signed)
Called and spoke with pt and she stated that the pharmacy told her they have been faxing over requests to get her lorazepam filled and have not heard back from [place].  i advised the pt that this rx was called and left on the VM for the pharmacy on 7/6.    i called and spoke with the pharmacist working today and she stated that they have the rx for the lorazepam and it is ready to be picked up.  i advised her that the pt was told that this rx had not been received yet from [place] and someone at the pharmacy advised the pt to call our office.   i called the pt back and she is aware that the pharmacy has had this rx done since Monday and pt stated that she will go by there and pick this up. Nothing further is needed.

## 2013-08-05 ENCOUNTER — Encounter: Payer: Self-pay | Admitting: Internal Medicine

## 2013-09-11 ENCOUNTER — Other Ambulatory Visit: Payer: Self-pay | Admitting: *Deleted

## 2013-09-11 MED ORDER — APIXABAN 5 MG PO TABS: 5.0000 mg | ORAL_TABLET | Freq: Two times a day (BID) | ORAL | Status: DC

## 2013-09-17 NOTE — Telephone Encounter (Signed)
error 

## 2013-10-06 ENCOUNTER — Ambulatory Visit (INDEPENDENT_AMBULATORY_CARE_PROVIDER_SITE_OTHER): Payer: No Typology Code available for payment source | Admitting: Pulmonary Disease

## 2013-10-06 ENCOUNTER — Encounter: Payer: Self-pay | Admitting: Pulmonary Disease

## 2013-10-06 VITALS — BP 110/72 | HR 60 | Temp 97.7°F | Ht 70.5 in | Wt 133.8 lb

## 2013-10-06 DIAGNOSIS — E7800 Pure hypercholesterolemia, unspecified: Secondary | ICD-10-CM

## 2013-10-06 DIAGNOSIS — Q796 Ehlers-Danlos syndrome, unspecified: Secondary | ICD-10-CM

## 2013-10-06 DIAGNOSIS — I495 Sick sinus syndrome: Secondary | ICD-10-CM

## 2013-10-06 DIAGNOSIS — E039 Hypothyroidism, unspecified: Secondary | ICD-10-CM

## 2013-10-06 DIAGNOSIS — I059 Rheumatic mitral valve disease, unspecified: Secondary | ICD-10-CM

## 2013-10-06 DIAGNOSIS — Z23 Encounter for immunization: Secondary | ICD-10-CM

## 2013-10-06 DIAGNOSIS — G459 Transient cerebral ischemic attack, unspecified: Secondary | ICD-10-CM

## 2013-10-06 DIAGNOSIS — IMO0001 Reserved for inherently not codable concepts without codable children: Secondary | ICD-10-CM

## 2013-10-06 DIAGNOSIS — R269 Unspecified abnormalities of gait and mobility: Secondary | ICD-10-CM

## 2013-10-06 DIAGNOSIS — F329 Major depressive disorder, single episode, unspecified: Secondary | ICD-10-CM

## 2013-10-06 DIAGNOSIS — Z950 Presence of cardiac pacemaker: Secondary | ICD-10-CM

## 2013-10-06 DIAGNOSIS — F3289 Other specified depressive episodes: Secondary | ICD-10-CM

## 2013-10-06 DIAGNOSIS — I679 Cerebrovascular disease, unspecified: Secondary | ICD-10-CM

## 2013-10-06 MED ORDER — LEVOTHYROXINE SODIUM 75 MCG PO TABS: ORAL_TABLET | ORAL | Status: DC

## 2013-10-06 MED ORDER — LORAZEPAM 1 MG PO TABS: ORAL_TABLET | ORAL | Status: DC

## 2013-10-06 MED ORDER — CITALOPRAM HYDROBROMIDE 20 MG PO TABS: ORAL_TABLET | ORAL | Status: DC

## 2013-10-06 NOTE — Progress Notes (Signed)
Subjective:    Patient ID: Amy Lane, female    DOB: 1950-09-15, 63 y.o.   MRN: [id number]  HPI 63 y/o WF here for a follow up visit... she has mult med problems as noted below... she is followed regularly by Drs Maryellen Pile for Cardiology, and Drs Jeri Modena for GI... ~  SEE PREV EPIC NOTES FOR OLDER DATA >>   ~  November 21, 2011:  12mo ROV & Pat has had two signif issues over the last few months>  1) presented to the ER 6/13 w/ an unusual event- sudden blurred vision, followed by transient paralysis of all 4 extremities & aphasia, evolved over <1H, denies HA; recall hx Ehlers-Danlos, MV repair, left CAE, & hx migraines in the past; in ER CTBrain was neg/ NAD (vasc calcif noted); CTA of head & neck showed mild arteriosclerotic changes, no stenoses, no aneurysms, etc (no change from 1/12 study); pt on Coumadin (does her own Protime checks at home w/& INR was 2.2... She was referred to DrWillis who felt this was prob a Migraine equivalent- he did an EEG which was normal; no change in therapy warranted & symptoms cleared & have not returned...  2) she has had some mod depression stemming from marital discord & she was getting counseling from [employer]; called 9/13 w/ request for antidepressant & we prescribed Prozac20 which she feels has been very helpful; they have had marital counseling as well & doing better at the moment she says...    Since her last visit she is eating better & has gained 6# up to 130#... Notes she is intol to all statins & had been on Niasp, off now on diet alone...    We reviewed prob list, meds, xrays and labs> see below for updates >> Given 2013 Flu vaccine...   LABS 11/13:  FLP- not at goals & we will refer to [facility];  Chems- wnl;  CBC- wnl;  TSH=1.55   ~  May 21, 2012:  71mo ROV & Pat has had a good interval- notes some recent allergy symptoms but OTC Benedryl is helping; she is also c/o some constipation & we discussed regular dosing w/ Miralax &  Senakot-S; also notes some "clumsiness, weakness in legs, and balance off" stating this has been off & on for yrs & she would like further eval- refer to Neuro per request... We reviewed the following medical problems during today's office visit >>     AR> on Benedryl & Nasonex prn; breathing is good w/o cough, sput, SOB, etc...    Hx Dysautonomia (POTS) & Ehlers-Danlos syndrome> followed by Cherly Hensen & DrKlein; stable on current meds...    MVP, Myxomatous valve- s/p MVrepair (in [place])> on ASA81; she is followed by DrKlein- seen 3/14 & stable pacer & Coumadin, no changes made...    Hx Tachy-brady, Fib-flutter w/ ablation & pacer> on Sectral200/d, & Coumadin; she does her own Protimes at home & called to the CC for adjustments...     Cerebrovasc Dis> s/p left CAE 2005 by [facility] for asymptomatic left carotid stenosis w/ ulceration, & f/u CDopplers all ok...    ?DVT & ?PE while on Coumadin (during trip to [place])> she now checks her own protimes like they do in [place]...    CHOL> on Niacin500; FLP 4/14 shows TChol 233, TG 78, HDL 75, LDL 146; she is intol to all meds- offered lipid clinic referral...    Hypothy> on Synthroid75; lab 4/14 shows TSH= 3.10; continue same meds.Marland KitchenMarland Kitchen  GI- Constip & Hems> on Align; c/o constip & asked to start Miralax, Senakot-S, added fiber; last colon 2009 by DrJacobs w/ hems only & f/u planned 37yrs...    GYN- kraurrosis vulvae (DIV)> local GYN, [facility] vulva clinic treatment & she is up to date on screening...    Migraine HAs & ?TIA> she is followed by [facility] Neuro- DrWillis...    Anxiety/ Depression> on Ativan 1mg  prn & off prev Prozac20 (it was really helping but "I'm past that now" & she felt this contrib to incr appetite & wt gain (Note- 130# & BMI=18 but she states "I'm up 3 pant sizes")... We reviewed prob list, meds, xrays and labs> see below for updates >>   LABS 4/14:  FLP- not at goals on diet alone but intol to all meds;  Chems- wnl;  CBC- wnl;  TSH=3.10;   VitD=19 & Rec VitD OTC supplement ~2000u daily...  ~  April 11, 2013:  68mo ROV & Pat was [facility] 1/6 - 01/30/13 by [facility] w/ HA & altered mental status- ?TIA vs complex migraine- CT Head was neg; CDopplers were ok; 2DEcho showed norm LVF, myxomatous MV s/p repair no resid MR; Coumadin was continued and ASA81 was added... She had ROV DrWillis 2/15> he felt the episode was a migraine equiv & no recurrent prob since then, she notes mild memory impairment w/ MMSE= 29/30... She notes that she's been under a lot of stress w/ father-in-law illness, husb w/ prostate ca, sold house & had to move... Now feeling much better w/ great attitude- "today is a great day"... We reviewed the following medical problems during today's office visit >>     AR> on Benedryl & Nasonex prn; breathing is good w/o cough, sput, SOB, etc...    Hx Dysautonomia (POTS) & Ehlers-Danlos syndrome> followed by Cherly Hensen & DrKlein; on ASA81 + Coumadin but talking about switch to NOAC...    MVP, Myxomatous valve- s/p MVrepair (in [place])> on ASA81, Coumadin; she is followed by DrKlein- seen 3/15 & stable pacer & no changes made...    Hx Tachy-brady, Fib-flutter w/ ablation & pacer> on Sectral200/d, & Coumadin; she does her own Protimes at home & called to the CC for adjustments...     Cerebrovasc Dis> on ASA81, s/p left CAE 2005 by [facility] for asymptomatic left carotid stenosis w/ ulceration, & f/u CDopplers all ok...    ?DVT & ?PE while on Coumadin (during trip to [place])> she now checks her own protimes like they do in [place]...    CHOL> on Zetia10 since 1/15 Hosp where LDL was 155; FLP on Zetia10 3/15 showed TChol 172, TG 74, HDL 59, LDL 89; she is intol to all other meds...    Hypothy> on Synthroid75; lab 3/15 shows TSH= 4.99; continue same meds, take pill first thing in morn, etc...    GI- Constip & Hems> on Align; c/o constip & asked to start Miralax, Senakot-S, added fiber; last colon 2009 by DrJacobs w/ hems only & f/u planned 83yrs...     GYN- kraurosis vulvae (DIV)> local GYN, [facility] vulva clinic treatment & she is up to date on screening...    Migraine HAs & ?TIA> she is followed by [facility] Neuro- DrWillis; Hosp 1/15 w/ another episode, symptoms cleared, ASA81 added to her Coumadin...    Anxiety/ Depression> on Ativan 1mg  prn & off prev Prozac20 (it was really helping but "I'm past that now" & she felt this contrib to incr appetite & wt gain (Note- 134# & BMI=18 but  she states "I'm up 3 pant sizes")... We reviewed prob list, meds, xrays and labs> see below for updates >>   LABS 4/15:  FLP- at goals on Zetia10;  Chems- wnl;  CBC- wnl;  TSH=4.99...  ~  October 06, 2013:  42mo ROV & [name] reports that she is doing well, CC= pain in hands & fingers (esp in the AM) w/ some swelling; she feels this is arthritis & asked to try OTC NSAID at bedtime to see if this helps... 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 We reviewed the following medical problems during today's office visit >>     AR> on Benedryl OTC & Nasonex prn; breathing is good w/o cough, sput, SOB, etc...    Hx Dysautonomia (POTS) & Ehlers-Danlos syndrome> followed by Cherly Hensen & DrKlein; on ASA81 + Eliquis5Bid...    MVP, Myxomatous valve- s/p MVrepair (in [place])> on ASA81, Eliquis5Bid; she is followed by DrKlein- seen 3/15 & stable pacer & no changes made...    Hx Tachy-brady, Fib-flutter w/ ablation & pacer> on Sectral200/d, & Eliquis5Bid; she does her own Protimes at home & called to the CC for adjustments...     Cerebrovasc Dis> on ASA81, s/p left CAE 2005 by [facility] for asymptomatic left carotid stenosis w/ ulceration, & f/u CDopplers all ok...    ?DVT & ?PE while on Coumadin (during trip to [place])> she then checked her own protimes like they do in [place]...    CHOL> on Zetia10 since 1/15 Hosp where LDL was 155; FLP on Zetia10 3/15 showed TChol 172, TG 74, HDL 59, LDL 89; she is intol to all other meds...    Hypothy> on Synthroid75; lab  3/15 shows TSH= 4.99; continue same meds, take pill first thing in morn, etc...    GI- Constip & Hems> on Align; c/o constip & asked to start Miralax, Senakot-S, added fiber; last colon 2009 by DrJacobs w/ hems only & f/u planned 44yrs...    GYN- kraurosis vulvae (DIV)> local GYN, [facility] vulva clinic treatment & she is up to date on screening; Hx enterococcus UTI...    Migraine HAs & ?TIA> she is followed by [facility] Neuro- DrWillis; Hosp 1/15 w/ another episode, symptoms cleared, ASA81 added to her Coumadin which was later changed to Eliquis5Bid...    Anxiety/ Depression> on Ativan 1mg  prn & started on Celexa20 which she really thinks is helping; wt stable (Note- 134# & BMI=18 but she states "I'm up 3 pant sizes")... We reviewed prob list, meds, xrays and labs> see below for updates >> [place] Flu vaccine today...           Problem List:      ALLERGIC RHINITIS (ICD-477.9) - we discussed Rx w/ Zyrtek, Astepro Prn, etc... ~  CXR 3/11 showed left pacer, sternal wires, NAD.Marland Kitchen. ~  CXR 6/13 showed stable post-op changes and pacer, pulm hyperinflation & scattered scarringt, calcif mediastinal nodes, no focal opacities, NAD.Marland Kitchen. ~  CXR 1/15 showed norm heart size, post op changes & pacer, no adenop, no edema, clear x some left apical pleural thickening w/o change...  MITRAL VALVE PROLAPSE (ICD-424.0) - followed by Cherly Hensen... ~  2DEcho 11/08 showed myxomatous MV, annuloplasty ring, decr post leaflet excursion, norm LVH & wall motion... ~  NuclearStressTest 11/08 showed mild apical thinning... prev study 12/04 w/o ischemia or infarct & EF=68%... ~  she sees [place] every 6 months- for f/u MVP (s/p MV repair at [facility]), dysautonomia (s/p pacer), & Ehlers-Danlos Syndrome>  last seen 1/15 & note reviewed, EKG w/ irreg rhythm, borderline QT interval... ~  2DEcho 1/15> cavity size was normal, wall thickness was normal w/ norm systolic function was normal & EF= 55% to 60%, myxomatous MV s/p repair with no  residual MR, LA is mildly dilated.  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 hx AFib/Flutter w/ ablation & pacemaker placed ... she has Ehlers-Danlos synd w/ prolonged QT interval...  CARDIAC PACEMAKER IN SITU (ICD-V45.01) - f/u DrKlein w/ CTChest 4/09- neg. ~  dual chamber pacer changed 3/11 by DrKlein for end-of-life... ~  EKG shows NSR, 1st degree AVB, RBBB... ~  3/15: she had f/u DrKlein> note reviewed, pacer functioning well- no changes made but he wanted her to consider a NOAC...  DYSAUTONOMIA (ICD-742.8) - prev on Proamatine 5mg  tabs per DrKlein, but this was stopped...  CEREBROVASCULAR DISEASE (ICD-437.9) - on ASA 81mg /d & COUMADIN (followed in the clinic)... ~  she had a Left CAE 11/05 by [facility] for symptomatic left carotid stenosis w/ ulceration... ~  Carotid angiogram 5/08 by [facility] showed <30% right carotid stenosis ~  f/u CTA of Head & Neck 12/09 was neg= norm CTA head, & no signif carotid dis noted in neck. ~  CDoppler 2/11 showed stable mild carotid dis, left CAE w/ DPA is patent, 0-39% bilat ICA stenoses. ~  repear CDopplers 11/11 per Cherly Hensen showed smooth plaque in right bulb & distal left CCA- stable; 0-39% bilat ICA stenoses... ~  CT Head 6/13 is neg- No acute intracranial abnormalities... ~  CTA Brain 6/13 showed mild atherosclerotic dis in the carotid siphon regions bilat w/o stenoses, otherw norm CTA brain w/o lesions... ~  CT Angio Neck 6/13 via ER showed mild nonstenotic arteriosclerotic dis at both carotid bifurcations w/o signif stenoses... ~  CDopplers 11/13 showed mild soft smooth plaque w/ 0-39% right ICAstenosis & 40-59% left ICA stenosis, vertebrals are antegrade... ~  CDopplers 1/15 showed 1-39% internal carotid artery stenosis bilaterally & antegrade vertebral flow...  ?DVT & poss PTE while on vacation in [place] during the summer of 2012 >> pt was on Coumadin & INR was therapeutic; she had pain & swelling  in left leg w/ a pos doppler reported by a [place] physician; she was also SOB & they questioned poss PTE but didn't get scan or further eval;  On return to [place] DrNishan wondered if she might have bled into the leg muscles & sent her to DrNorris for Ortho eval; he could not corroborate the theory & treated her w/ compression stockings & she gradually improved towards her baseline...  HYPERCHOLESTEROLEMIA (ICD-272.0) - on diet alone now, prev on Lip20- this was stopped 1/12 by Cherly Hensen & she feels much better off this med! ~  [place] 05/13/07 on Lip20 showed TChol 119, TG 50, HDL 55, LDL 54 ~  FLP 5/10 on Lip20 showed TChol 120, TG 43, HDL 58, LDL 54 ~  FLP 9/11 on Lip20 showed TChol 130, TG 54, HDL 59, LDL 60 ~  FLP 5/12 on diet alone showed TChol 218, TG 91, HDL 57, LDL 135... Continue low chol, low fat diet. ~  FLP 10/12 showed TChol 231, TG 86, HDL 68, LDL 151... I rec the Lipid CLinic, & they started NIACIN 500mg /d. ~  FLP 2/13 on Niacin500 showed TChol 199, TG 55, HDL 76, LDL 112... Much improved, continue same. ~  FLP 11/13 on diet alone showed TChol 231, TG 49, HDL 67, LDL 151..,. rec refer to  Lipid clinic. ~  [place] 4/14 on diet + Niacin showed TChol 233, TG 78, HDL 75, LDL 146  ~  FLP 1/15 in hosp showed Tchol 235, TG 79, HDL 64, LDL 155 => ZETIA 10mg /d started... ~  [place] 3/15 on Zetia10 showed TChol 172, TG 74, HDL 59, LDL 89... Continue same...  HYPOTHYROIDISM (ICD-244.9) - currently on SYNTHROID 49mcg/d... ~  labs 4/09 on Levoth88 showed TSH = 0.10... rec to decr Synthroid to 58mcg/d... ~  labs 9/09 on Levoth75 showed TSH = 1.67 ~  labs 5/10 on Levoth75 showed TSH = 2.92... Continue same. ~  labs 9/11 on Levoth75 showed TSH= 1.37 ~  Labs 5/12 on Levothy75 showed TSH= 4.33 ~  Labs 10/12 on Levothy75 showed TSH= 1.51... Continue same. ~  Labs 11/13 on Levothy75 showed TSH= 1.55 ~  Labs 4/14 on Levothy75 showed TSH= 3.10 ~  Labs 3/15 on Levothy75 showed TSH= 4.99... Reminded to take every AM on  empty stomach etc.  HEMORRHOIDS (ICD-455.6) - last colonoscopy 2/09 by DrJacobs showed only sm hems... f/u 70yrs.  GYN - followed by DrMcPhail and Dx w/ kraurosis vulvae 7/10... Prev on Premarin VagCream. ~  11/10: she discussed the Dx w/ me & will inquire about poss hyst/ BSO... ~  4/11:  eval in the [facility] by Dr Illene Bolus showed vulvar dermatitis & dryness w/ rec for "Crisco" Tid! 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   FIBROMYALGIA (ICD-729.1) - she has been doing Yoga classes and this has helped... ~  She saw DrNorris 9/14 w/ neck pain> CT CSpine showed degen disc dis & he rec PT...  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  Hx of TRANSIENT ISCHEMIC ATTACK (ICD-435.9) - on ASA 81mg /d & COUMADIN> she has been followed by DrWillis; s/p left carotid endarterectomy 11/05 by [facility]. ~  1/12: f/u eval by DrWillis reviewed... ~  6/13: she presented w/ ?TIA symptoms & eval was neg; review by DrWillis indicated prob Migraine & everything resolved... ~  1/15: she had another episode, Hosp by [facility] x2d but felt to prob be Migraine related...  Hx of SYNCOPE (ICD-780.2) - felt to be neurally mediated, ? related to long QT, no recur since pacer placed...  MIGRAINE HEADACHE >> she presented w/ ?TIA symptoms 6/13 & eval was neg; review by DrWillis indicated prob Migraine & everything resolved, no recurrence... ~  Seen by Neuro 2/15 DrWillis w/ migraines, memory deficit, abn of gait;   DEPRESSION (ICD-311) - she takes LORAZEPAM 1mg Qhs for insomnia... she prev saw DrGraves for counselling in [place]... ~  11/10: poor appetite & some wt loss prob related to depression & we discussed trial Lexapro 10mg /d... ~  3/11: she is very pleased- feels better, gained 5# w/ Ensure, etc... ~  9/11:  "I feel great" & Lexapro continued... ~   She was able to stop the Lexapro 1/12 (also stopped the Lipitor) & has done well off it ever since... ~  10/13:  She called w/ situational depression due to marital stress, getting counseling, etc; started PROZAC 20mg /d & she reports much improved... ~  2015: she is off the Prozac and using Ativan 1mg  prn... ~  9/15: on Ativan1mg  prn and CELEXA20 called in several mo ago & she is much improved...  CARCINOMA, BASAL CELL (ICD-173.9) - skin cancer removed from left leg by [facility] 2008. ~  12/11:  She reports bx of rash on legs= lichen planus per DrHall, treated w/ topical ointment... ~  2/13:  She had a skin lesion removed from her neck= basal cell ca & wide excision performed by DrLupton.  Health Maintenance: ~  GI:  followed by DrJacobs w/ colonoscopy 2/09 showing only sm hems... ~  GYN:  followed by DrMcPhail, and Dr. Illene Bolus at [facility]... ~  Labs 9/11 showed Vit D level = 39 & rec to start 1000 u OTC supplement... ~  Immunizations:  she had TDAP 3/11... she gets yearly Flu vaccine in the Fall of the yr...    Past Surgical History  Procedure Laterality Date  . Complex mitral valve repair      at the [facility]  . Left carotid endarterectomy  11/2003    by Dr. Delton See  . Pacemaker insertion      medtronic kappa [id number]  . Removed chalazion from left eye lid  09/2011    Dr. Satira Sark    Outpatient Encounter Prescriptions as of 10/06/2013  Medication Sig  . acebutolol (SECTRAL) 200 MG capsule Take 1 capsule (200 mg total) by mouth at bedtime.  Marland Kitchen apixaban (ELIQUIS) 5 MG TABS tablet Take 1 tablet (5 mg total) by mouth 2 (two) times daily.  . Cholecalciferol (VITAMIN D) 2000 UNITS tablet Take 2,000 Units by mouth every morning.  . citalopram (CELEXA) 20 MG tablet START WITH 1 DAILY ---MAY INCREASE TO 2 DAILY IF NECESSARY  . ezetimibe (ZETIA) 10 MG tablet Take 1 tablet (10 mg total) by mouth daily.  Marland Kitchen levothyroxine (SYNTHROID, LEVOTHROID) 75 MCG tablet TAKE 1 TABLET BY MOUTH  EVERY DAY  . LORazepam (ATIVAN) 1 MG tablet TAKE 1 TABLET AT BEDTIME AS NEEDED FOR SLEEP  . polyethylene glycol (MIRALAX / GLYCOLAX) packet Take 17 g by mouth every evening.  . Probiotic Product (ALIGN) 4 MG CAPS Take 4 mg by mouth every morning.  . senna (SENOKOT) 8.6 MG TABS tablet Take 1 tablet by mouth at bedtime.    Allergies  Allergen Reactions  . Other Other (See Comments)    All QTc prolongation drugs  . Statins Other (See Comments)    myalgias    Current Medications, Allergies, Past Medical History, Past Surgical History, Family History, and Social History were reviewed in [employer] record.    Review of Systems         See HPI - all other systems neg except as noted... The patient complains of dyspnea on exertion.  The patient denies anorexia, fever, weight loss, weight gain, vision loss, decreased hearing, hoarseness, chest pain, syncope, peripheral edema, prolonged cough, headaches, hemoptysis, abdominal pain, melena, hematochezia, severe indigestion/heartburn, hematuria, incontinence, muscle weakness, suspicious skin lesions, transient blindness, difficulty walking, depression, unusual weight change, abnormal bleeding, enlarged lymph nodes, and angioedema.     Objective:   Physical Exam     WD, Thin, 63 y/o WF in NAD... she is 5'11"Tall and 141# = BMI~20... Vital Signs:  Reviewed... GENERAL:  Alert & oriented; pleasant & cooperative... HEENT:  [place]/AT, EOM-wnl, PERRLA, EACs-clear, TMs-wnl, NOSE-clear, THROAT-clear & wnl. NECK:  Supple w/ fair ROM; no JVD; normal carotid impulses w/o bruits, left CAE scar; no thyromegaly or nodules palpated; no lymphadenopathy. CHEST:  Clear to P & A; without wheezes/ rales/ or rhonchi heard... HEART:  Regular Rhythm; pacer on left, without murmurs/ rubs/ or gallops detected... ABDOMEN:  Soft & nontender; normal bowel sounds; no organomegaly or masses palpated... EXT:  without deformities or arthritic changes; no  varicose veins/ venous insuffic/ or edema. NEURO:  CN's intact;  no focal neuro deficits... DERM:  s/p skin cancer removed from left leg; min rash noted...  RADIOLOGY DATA:  Reviewed in the EPIC EMR & discussed w/ the patient...  LABORATORY DATA:  Reviewed in the EPIC EMR & discussed w/ the patient...   Assessment & Plan:    MVP/ MV Repair> Tachy-Brady/ PACER> Dysautonomia>  Followed by Cherly Hensen & DrKlein, doing well on Coumadin (with home monitoring) & Sectral, continue same...  Cerebrovasc Dis>  Also takes ASA 81mg /d, s/p left CAE 11/05, no recurrent erebral ischemic symptoms...  ?DVT while in [place]> moot issue at present; stable on her Coumadin via CC; leg swelling diminished on the compression hose, no salt, elevation, etc; she is back to her exercise program & back to baseline & she was switched to Eliquis5Bid.  CHOL>  She feels much better off the Lipitor & FLP improved w/ Zetia10 started 1/15 [facility]- continue same...  Hypothyroid>  Stable on the Synthroid 45mcg/d...  GYN>  Stable & followed both here & at [facility] (see above)...  EHLERS-DANLOS>  Aware, stable, she has been feeling better & in great spirits...  Migraines> Hosp 6/13 & 1/15 w/ ?TIA but DrWillis thought likely Migraine syndrome- resolved, neg work up, no recurrence & they are following... Neuro> she mentions several symtoms that she is concerned about- clumsy, balance off, subjective leg weakness- & she is concerned; therefore refer to Neurology for eval...  Depression>  Off Prozac20 & on Celexa20 & Ativan prn; she & husb received counseling...  Other medical problems as noted...   Patient's Medications  New Prescriptions   No medications on file  Previous Medications   ACEBUTOLOL (SECTRAL) 200 MG CAPSULE    Take 1 capsule (200 mg total) by mouth at bedtime.   APIXABAN (ELIQUIS) 5 MG TABS TABLET    Take 1 tablet (5 mg total) by mouth 2 (two) times daily.   CHOLECALCIFEROL (VITAMIN D) 2000 UNITS TABLET    Take  2,000 Units by mouth every morning.   EZETIMIBE (ZETIA) 10 MG TABLET    Take 1 tablet (10 mg total) by mouth daily.   POLYETHYLENE GLYCOL (MIRALAX / GLYCOLAX) PACKET    Take 17 g by mouth every evening.   PROBIOTIC PRODUCT (ALIGN) 4 MG CAPS    Take 4 mg by mouth every morning.   SENNA (SENOKOT) 8.6 MG TABS TABLET    Take 1 tablet by mouth at bedtime.  Modified Medications   Modified Medication Previous Medication   CITALOPRAM (CELEXA) 20 MG TABLET citalopram (CELEXA) 20 MG tablet      START WITH 1 DAILY ---MAY INCREASE TO 2 DAILY IF NECESSARY    START WITH 1 DAILY ---MAY INCREASE TO 2 DAILY IF NECESSARY   LEVOTHYROXINE (SYNTHROID, LEVOTHROID) 75 MCG TABLET levothyroxine (SYNTHROID, LEVOTHROID) 75 MCG tablet      TAKE 1 TABLET BY MOUTH EVERY DAY    TAKE 1 TABLET BY MOUTH EVERY DAY   LORAZEPAM (ATIVAN) 1 MG TABLET LORazepam (ATIVAN) 1 MG tablet      TAKE 1 TABLET AT BEDTIME AS NEEDED FOR SLEEP    TAKE 1 TABLET AT BEDTIME AS NEEDED FOR SLEEP  Discontinued Medications   No medications on file

## 2013-10-06 NOTE — Patient Instructions (Signed)
Today we updated your med list in our EPIC system...    Continue your current medications the same...  We refilled the meds you requested...  You may try some advil or aleve for your hand symptoms...  Today we gave you the 2015 Flu vaccine...  Call for any questions...  Let's plan a follow up visit in 54mo w/ FASTING blood work, etc at that time.Marland KitchenMarland Kitchen

## 2013-10-13 ENCOUNTER — Ambulatory Visit (INDEPENDENT_AMBULATORY_CARE_PROVIDER_SITE_OTHER): Payer: No Typology Code available for payment source | Admitting: *Deleted

## 2013-10-13 DIAGNOSIS — I498 Other specified cardiac arrhythmias: Secondary | ICD-10-CM

## 2013-10-13 DIAGNOSIS — R001 Bradycardia, unspecified: Secondary | ICD-10-CM

## 2013-10-13 LAB — MDC_IDC_ENUM_SESS_TYPE_REMOTE
Battery Impedance: 183 Ohm
Battery Remaining Longevity: 126 mo
Battery Voltage: 2.8 V
Brady Statistic AP VS Percent: 74 %
Date Time Interrogation Session: 20150921110910
Lead Channel Impedance Value: 461 Ohm
Lead Channel Pacing Threshold Amplitude: 1 V
Lead Channel Pacing Threshold Pulse Width: 0.4 ms
Lead Channel Pacing Threshold Pulse Width: 0.4 ms
Lead Channel Sensing Intrinsic Amplitude: 5.6 mV
Lead Channel Setting Pacing Amplitude: 2 V
Lead Channel Setting Pacing Amplitude: 2.5 V
Lead Channel Setting Pacing Pulse Width: 0.4 ms
Lead Channel Setting Sensing Sensitivity: 5.6 mV
MDC IDC MSMT LEADCHNL RA PACING THRESHOLD AMPLITUDE: 0.75 V
MDC IDC MSMT LEADCHNL RA SENSING INTR AMPL: 1.4 mV
MDC IDC MSMT LEADCHNL RV IMPEDANCE VALUE: 600 Ohm
MDC IDC STAT BRADY AP VP PERCENT: 0 %
MDC IDC STAT BRADY AS VP PERCENT: 0 %
MDC IDC STAT BRADY AS VS PERCENT: 26 %

## 2013-10-13 NOTE — Progress Notes (Signed)
Remote pacemaker transmission.   

## 2013-10-27 ENCOUNTER — Encounter: Payer: Self-pay | Admitting: Cardiology

## 2013-10-31 ENCOUNTER — Encounter: Payer: Self-pay | Admitting: Internal Medicine

## 2013-11-03 ENCOUNTER — Telehealth: Payer: Self-pay | Admitting: *Deleted

## 2013-11-03 ENCOUNTER — Ambulatory Visit: Payer: PRIVATE HEALTH INSURANCE | Admitting: Neurology

## 2013-11-03 NOTE — Telephone Encounter (Signed)
Patient calling in to r/s appointment pt r/s for 12/09/13 at 2:30 pm.

## 2013-11-05 ENCOUNTER — Telehealth: Payer: Self-pay | Admitting: Cardiovascular Disease

## 2013-11-05 NOTE — Telephone Encounter (Signed)
Patient reports having intermittent pain behind left knee for approx 2 weeks. Unsure if it swollen or not. No redness, no fever. Is concerned this could be a blood clot. No recent injuries or insect bites. Will consult with [name] PA.

## 2013-11-05 NOTE — Telephone Encounter (Signed)
New message      Pt had pain behind left knee for several days----now spot is swollen.  Could this be a blood clot?

## 2013-11-05 NOTE — Telephone Encounter (Signed)
Per [name], patient needs to be seen either by Dr. [name] office or an Urgent Care. She is agreeable to this plan.

## 2013-11-11 ENCOUNTER — Encounter: Payer: Self-pay | Admitting: Cardiology

## 2013-11-14 ENCOUNTER — Other Ambulatory Visit: Payer: Self-pay | Admitting: *Deleted

## 2013-11-14 MED ORDER — APIXABAN 5 MG PO TABS: 5.0000 mg | ORAL_TABLET | Freq: Two times a day (BID) | ORAL | Status: DC

## 2013-11-26 ENCOUNTER — Other Ambulatory Visit: Payer: Self-pay | Admitting: Pulmonary Disease

## 2013-12-09 ENCOUNTER — Ambulatory Visit (INDEPENDENT_AMBULATORY_CARE_PROVIDER_SITE_OTHER): Payer: PRIVATE HEALTH INSURANCE | Admitting: Neurology

## 2013-12-09 ENCOUNTER — Encounter: Payer: Self-pay | Admitting: Neurology

## 2013-12-09 VITALS — BP 108/69 | HR 70 | Ht 70.0 in | Wt 140.2 lb

## 2013-12-09 DIAGNOSIS — M6281 Muscle weakness (generalized): Secondary | ICD-10-CM

## 2013-12-09 DIAGNOSIS — R269 Unspecified abnormalities of gait and mobility: Secondary | ICD-10-CM

## 2013-12-09 DIAGNOSIS — R413 Other amnesia: Secondary | ICD-10-CM

## 2013-12-09 NOTE — Patient Instructions (Signed)

## 2013-12-09 NOTE — Progress Notes (Signed)
Reason for visit: muscle weakness  Amy Lane is an 63 y.o. female  History of present illness:  Amy Lane is a 63 year old right-handed white female with a history of Ehlers-Danlos syndrome. The patient has neurologic migraine headaches. The patient also has some issues with arthritis affecting the hands with swelling in the hands and pain in the thumbs. Within the last 18 months, she has had a subtle gradual change in her lower extremity strength. The patient has noted that if she has to step up on a steep step, she has difficulty pulling herself up. If she squats down, she has difficulty standing back up again. The patient is on Zetia her cholesterol, but she is not on a statin medication. She does not note any significant weakness in the upper extremities. She denies any pain in the legs or in the low back. She has no numbness in the extremities or problems controlling the bowels or the bladder. She has not had any recent falls. She returns to this office for an evaluation.  Past Medical History  Diagnosis Date  . Herpes zoster   . SVT (supraventricular tachycardia)   . PSVT (paroxysmal supraventricular tachycardia)   . PAT (paroxysmal atrial tachycardia)   . History of tachycardia-bradycardia syndrome   . Atrial fibrillation   . Dysautonomia   . History of syncope   . Cardiac pacemaker     DDD  MDT  . Long term (current) use of anticoagulants   . Allergic rhinitis   . Mitral valve prolapse   . Cerebrovascular disease   . Hypercholesteremia   . Hypothyroidism   . Hemorrhoids   . Ehler's-Danlos syndrome   . Fibromyalgia   . History of transient ischemic attack   . Depression   . Carcinomas, basal cell   . Skin desquamation     inflammative vaginitis  . Abnormal gait   . Hypercholesterolemia   . Headache(784.0)     Migraine    Past Surgical History  Procedure Laterality Date  . Complex mitral valve repair      at the [facility]  . Left carotid  endarterectomy  11/2003    by Dr. Delton See  . Pacemaker insertion      medtronic kappa 901  . Removed chalazion from left eye lid  09/2011    Dr. Satira Sark    Family History  Problem Relation Age of Onset  . Cancer      family history of  . Coronary artery disease      family history of  . Leukemia      Nephew  . Lung cancer Mother   . Prostate cancer Father   . Alzheimer's disease Father   . Heart disease Father   . Mitral valve prolapse Brother   . Diabetes Sister   . Mitral valve prolapse Sister     Social history:  reports that she has quit smoking. Her smoking use included Cigarettes. She smoked 0.00 packs per day. She has never used smokeless tobacco. She reports that she drinks alcohol. She reports that she does not use illicit drugs.    Allergies  Allergen Reactions  . Other Other (See Comments)    All QTc prolongation drugs  . Statins Other (See Comments)    myalgias    Medications:  Current Outpatient Prescriptions on File Prior to Visit  Medication Sig Dispense Refill  . acebutolol (SECTRAL) 200 MG capsule Take 1 capsule (200 mg total) by mouth at bedtime. [place]  capsule 3  . apixaban (ELIQUIS) 5 MG TABS tablet Take 1 tablet (5 mg total) by mouth 2 (two) times daily. 60 tablet 3  . citalopram (CELEXA) 20 MG tablet START WITH 1 DAILY ---MAY INCREASE TO 2 DAILY IF NECESSARY 60 tablet 6  . ezetimibe (ZETIA) 10 MG tablet Take 1 tablet (10 mg total) by mouth daily. 90 tablet 1  . levothyroxine (SYNTHROID, LEVOTHROID) 75 MCG tablet TAKE 1 TABLET BY MOUTH EVERY DAY 90 tablet 1  . LORazepam (ATIVAN) 1 MG tablet TAKE 1 TABLET AT BEDTIME AS NEEDED FOR SLEEP 30 tablet 5  . polyethylene glycol (MIRALAX / GLYCOLAX) packet Take 17 g by mouth daily as needed.     . Probiotic Product (ALIGN) 4 MG CAPS Take 4 mg by mouth every morning.    . senna (SENOKOT) 8.6 MG TABS tablet Take 1 tablet by mouth at bedtime as needed.      No current facility-administered medications on file prior  to visit.    ROS:  Out of a complete 14 system review of symptoms, the patient complains only of the following symptoms, and all other reviewed systems are negative.  Appetite change Runny nose Light sensitivity Choking Cold intolerance, excessive eating Constipation Insomnia Joint pain, joint swelling, aching muscles Bruising easily Muscle weakness Depression  Blood pressure 108/69, pulse 70, height 5\' 10"  (1.778 m), weight 140 lb 3.2 oz (63.594 kg).  Physical Exam  General: The patient is alert and cooperative at the time of the examination.  Skin: No significant peripheral edema is noted.   Neurologic Exam  Mental status: The Mini-Mental status examination done today shows a total score 26/30.  Cranial nerves: Facial symmetry is present. Speech is normal, no aphasia or dysarthria is noted. Extraocular movements are full. Visual fields are full.  Motor: The patient has good strength in all 4 extremities, with exception of very minimal weakness of the right biceps muscle and hip flexors bilaterally. The patient is able to walk on heels and the toes bilaterally. The patient is able to rise from a seated position with arms crossed. With squatting, the patient is able to stand up, but only with difficulty.  Sensory examination: Soft touch sensation is symmetric on the face, arms, and legs.  Coordination: The patient has good finger-nose-finger and heel-to-shin bilaterally.  Gait and station: The patient has a normal gait. Tandem gait is minimally unsteady. Romberg is negative. No drift is seen.  Reflexes: Deep tendon reflexes are symmetric.   Assessment/Plan:  1. History of migraine headache with neurologic features  2. Ehlers-Danlos syndrome  3. Reported muscle weakness  4. Mild memory disturbance  The patient has very subtle minimal weakness in the proximal muscles of the legs. The patient will be set up for blood work today, and she will have nerve conduction  studies done on both legs and the right arm, and EMG evaluation on the right arm and right leg. She will follow-up for above study. If this study is normal, we will consider therapy including leg strengthening exercises.  Amy Alexanders MD 12/09/2013 8:35 PM  [facility] [address] [place], [place] [address]  Phone [phone] Fax [phone]

## 2013-12-11 LAB — ACETYLCHOLINE RECEPTOR, BINDING: AChR Binding Ab, Serum: 0.03 nmol/L (ref 0.00–0.24)

## 2013-12-11 LAB — CK: Total CK: 67 U/L (ref 24–173)

## 2013-12-11 LAB — SEDIMENTATION RATE: Sed Rate: 4 mm/hr (ref 0–40)

## 2013-12-11 LAB — ANGIOTENSIN CONVERTING ENZYME: Angio Convert Enzyme: 23 U/L (ref 14–82)

## 2013-12-29 ENCOUNTER — Ambulatory Visit (INDEPENDENT_AMBULATORY_CARE_PROVIDER_SITE_OTHER): Payer: Self-pay | Admitting: Neurology

## 2013-12-29 ENCOUNTER — Ambulatory Visit (INDEPENDENT_AMBULATORY_CARE_PROVIDER_SITE_OTHER): Payer: PRIVATE HEALTH INSURANCE | Admitting: Neurology

## 2013-12-29 ENCOUNTER — Telehealth: Payer: Self-pay | Admitting: Pulmonary Disease

## 2013-12-29 DIAGNOSIS — M6281 Muscle weakness (generalized): Secondary | ICD-10-CM

## 2013-12-29 DIAGNOSIS — R413 Other amnesia: Secondary | ICD-10-CM

## 2013-12-29 DIAGNOSIS — R269 Unspecified abnormalities of gait and mobility: Secondary | ICD-10-CM

## 2013-12-29 NOTE — Progress Notes (Signed)
Please refer to EMG note. 

## 2013-12-29 NOTE — Progress Notes (Signed)
Amy Lane is a 63 year old patient with a history of Ehlers-Danlos syndrome. The patient has presented with difficulty with mild proximal weakness of both lower extremities. She is being evaluated for possible myopathic disorder.  She comes in for EMG and nerve conduction studies that were done on the right arm and both legs with the nerve conductions that were normal. EMG of the right arm and right leg were normal. No evidence of a myopathic disorder was seen.  The patient will be sent for physical therapy for leg strengthening exercises. She will follow-up in 5 or 6 months.

## 2013-12-29 NOTE — Procedures (Signed)
HISTORY:  Amy Lane is a 63 year old patient with a history of a Ehlers-Danlos syndrome. The patient has a history of mild proximal weakness of the legs, difficulty getting up from chairs and getting up from a squatting position. She is being evaluated for possible myopathy.  NERVE CONDUCTION STUDIES:  Nerve conduction studies were performed on the right upper extremity. The distal motor latencies and motor amplitudes for the median and ulnar nerves were within normal limits. The F wave latencies and nerve conduction velocities for these nerves were also normal. The sensory latencies for the median, radial, and ulnar nerves were normal.  Nerve conduction studies were performed on both lower extremities. The distal motor latencies and motor amplitudes for the peroneal and posterior tibial nerves were within normal limits. The nerve conduction velocities for these nerves were also normal. The F wave latencies were prolonged for the right peroneal nerve and for the posterior tibial nerves bilaterally, normal for the left peroneal nerve. The sensory latencies for the peroneal nerves were within normal limits.   EMG STUDIES:  EMG study was performed on the right upper extremity:  The first dorsal interosseous muscle reveals 2 to 4 K units with full recruitment. No fibrillations or positive waves were noted. The abductor pollicis brevis muscle reveals 2 to 4 K units with full recruitment. No fibrillations or positive waves were noted. The extensor indicis proprius muscle reveals 1 to 3 K units with full recruitment. No fibrillations or positive waves were noted. The biceps muscle reveals 1 to 2 K units with full recruitment. No fibrillations or positive waves were noted. The triceps muscle reveals 2 to 4 K units with full recruitment. No fibrillations or positive waves were noted. The anterior deltoid muscle reveals 2 to 3 K units with full recruitment. No fibrillations or positive waves  were noted. The cervical paraspinal muscles were tested at 2 levels. No abnormalities of insertional activity were seen at either level tested. There was poor relaxation.  EMG study was performed on the right lower extremity:  The tibialis anterior muscle reveals 2 to 4K motor units with full recruitment. No fibrillations or positive waves were seen. The peroneus tertius muscle reveals 2 to 4K motor units with full recruitment. No fibrillations or positive waves were seen. The medial gastrocnemius muscle reveals 1 to 3K motor units with full recruitment. No fibrillations or positive waves were seen. The vastus lateralis muscle reveals 2 to 4K motor units with full recruitment. No fibrillations or positive waves were seen. The iliopsoas muscle reveals 2 to 4K motor units with full recruitment. No fibrillations or positive waves were seen. The biceps femoris muscle (long head) reveals 2 to 4K motor units with full recruitment. No fibrillations or positive waves were seen. The lumbosacral paraspinal muscles were tested at 3 levels, and revealed no abnormalities of insertional activity at all 3 levels tested. There was good relaxation.   IMPRESSION:  Nerve conduction studies done on the right upper extremity and both lower extremities relatively unremarkable. The mild prolongation of the F wave latencies in the lower extremities may be related to leg length. No evidence of a peripheral neuropathy is seen. EMG evaluation of the right upper and right lower extremity is were unremarkable, without evidence of an overlying myopathic process or evidence of a cervical or a lumbosacral radiculopathy on the right.  [name] MD 12/29/2013 11:00 AM  [facility] [address] [place], [place] [address]  Phone [phone] Fax [phone]

## 2013-12-30 NOTE — Telephone Encounter (Signed)
Refill Ativan.  Last refill: 10/06/13; Last OV: 10/06/13; Next OV: 04/06/14; ok to refill?

## 2014-01-01 ENCOUNTER — Telehealth: Payer: Self-pay | Admitting: Pulmonary Disease

## 2014-01-01 ENCOUNTER — Other Ambulatory Visit: Payer: Self-pay | Admitting: Pulmonary Disease

## 2014-01-01 NOTE — Telephone Encounter (Signed)
RX for lorazepam was refilled 10/06/13 #30 x 5 refills. Called [facility] and spoke with [place]. Pt has refills on file. Pt had called in an old RX # for refill. They will get this ready for pt.  Pt aware. Nothing further needed

## 2014-01-07 ENCOUNTER — Other Ambulatory Visit: Payer: Self-pay | Admitting: Adult Health

## 2014-01-13 ENCOUNTER — Telehealth: Payer: Self-pay | Admitting: Cardiology

## 2014-01-13 ENCOUNTER — Ambulatory Visit (INDEPENDENT_AMBULATORY_CARE_PROVIDER_SITE_OTHER): Payer: No Typology Code available for payment source | Admitting: *Deleted

## 2014-01-13 DIAGNOSIS — I495 Sick sinus syndrome: Secondary | ICD-10-CM

## 2014-01-13 NOTE — Telephone Encounter (Signed)
LMOVM reminding pt to send remote transmission.   

## 2014-01-14 LAB — MDC_IDC_ENUM_SESS_TYPE_REMOTE
Battery Impedance: 231 Ohm
Battery Remaining Longevity: 119 mo
Brady Statistic AP VP Percent: 0 %
Brady Statistic AS VP Percent: 0 %
Brady Statistic AS VS Percent: 27 %
Lead Channel Impedance Value: 586 Ohm
Lead Channel Pacing Threshold Amplitude: 0.625 V
Lead Channel Pacing Threshold Amplitude: 0.875 V
Lead Channel Pacing Threshold Pulse Width: 0.4 ms
Lead Channel Sensing Intrinsic Amplitude: 1.4 mV
Lead Channel Sensing Intrinsic Amplitude: 5.6 mV
Lead Channel Setting Pacing Pulse Width: 0.4 ms
Lead Channel Setting Sensing Sensitivity: 2.8 mV
MDC IDC MSMT BATTERY VOLTAGE: 2.79 V
MDC IDC MSMT LEADCHNL RA IMPEDANCE VALUE: 474 Ohm
MDC IDC MSMT LEADCHNL RV PACING THRESHOLD PULSEWIDTH: 0.4 ms
MDC IDC SESS DTM: 20151223022921
MDC IDC SET LEADCHNL RA PACING AMPLITUDE: 2 V
MDC IDC SET LEADCHNL RV PACING AMPLITUDE: 2.5 V
MDC IDC STAT BRADY AP VS PERCENT: 73 %

## 2014-01-14 NOTE — Progress Notes (Signed)
Remote pacemaker transmission.   

## 2014-01-20 ENCOUNTER — Encounter: Payer: Self-pay | Admitting: Cardiology

## 2014-01-26 ENCOUNTER — Ambulatory Visit: Payer: No Typology Code available for payment source | Attending: Neurology

## 2014-01-26 DIAGNOSIS — R279 Unspecified lack of coordination: Secondary | ICD-10-CM | POA: Diagnosis present

## 2014-01-26 DIAGNOSIS — R29898 Other symptoms and signs involving the musculoskeletal system: Secondary | ICD-10-CM | POA: Diagnosis not present

## 2014-01-26 DIAGNOSIS — R2689 Other abnormalities of gait and mobility: Secondary | ICD-10-CM | POA: Diagnosis not present

## 2014-01-27 DIAGNOSIS — R279 Unspecified lack of coordination: Secondary | ICD-10-CM | POA: Diagnosis not present

## 2014-01-27 NOTE — Therapy (Signed)
[facility] [address] [place], [place], [address] Phone: [phone]   Fax:  [phone]  Physical Therapy Evaluation  Patient Details  Name: Amy Lane MRN: [id number] Date of Birth: 30-Sep-1950  Encounter Date: 01/26/2014      PT End of Session - 01/27/14 1710    Visit Number 1   Number of Visits 17   Date for PT Re-Evaluation 03/27/14   Authorization Type [employer] with G code every 10th visit   PT Start Time 1015   PT Stop Time 1100   PT Time Calculation (min) 45 min      Past Medical History  Diagnosis Date  . Herpes zoster   . SVT (supraventricular tachycardia)   . PSVT (paroxysmal supraventricular tachycardia)   . PAT (paroxysmal atrial tachycardia)   . History of tachycardia-bradycardia syndrome   . Atrial fibrillation   . Dysautonomia   . History of syncope   . Cardiac pacemaker     DDD  MDT  . Long term (current) use of anticoagulants   . Allergic rhinitis   . Mitral valve prolapse   . Cerebrovascular disease   . Hypercholesteremia   . Hypothyroidism   . Hemorrhoids   . Ehler's-Danlos syndrome   . Fibromyalgia   . History of transient ischemic attack   . Depression   . Carcinomas, basal cell   . Skin desquamation     inflammative vaginitis  . Abnormal gait   . Hypercholesterolemia   . Headache(784.0)     Migraine    Past Surgical History  Procedure Laterality Date  . Complex mitral valve repair      at the [facility]  . Left carotid endarterectomy  11/2003    by Dr. Delton See  . Pacemaker insertion      medtronic kappa 901  . Removed chalazion from left eye lid  09/2011    Dr. Satira Sark    There were no vitals taken for this visit.  Visit Diagnosis:  Lack of coordination - Plan: PT plan of care cert/re-cert  Decreased ROM of neck - Plan: PT plan of care cert/re-cert  Abnormality of gait due to impairment of balance - Plan: PT plan of care  cert/re-cert      Subjective Assessment - 01/26/14 1023    Symptoms Pt has had recent decline in muscle strength finiding that it is more difficult to rise from a chair and to rise from the floor. She has also had a decline in her balance over the past 2 years. Recent EMG of all extremities came out WNL.   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 No/denies          [facility] PT Assessment - 01/27/14 0755    Assessment   Medical Diagnosis Abnormality of gait and muscle weakness   Onset Date --  2 years ago   Prior Therapy none   Precautions   Precautions ICD/Pacemaker;Fall  Monitor vitals PRN (monitor shortness of breath)   Precaution Comments Pt has occasional cardiac "spells" in which she is symptomatic with shortness of breath and must lay flat for it to resolve. 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, walking around all day, staging the floor at the store    Leisure traveling, decorating, furniture, shopping   Coordination   Gross Motor Movements are Fluid and  Coordinated Yes   Finger Nose Finger Test normal   Heel Shin Test normal   Functional Tests   Functional tests --  5x sit to stand 20.56   Posture/Postural Control   Posture/Postural Control No significant limitations   AROM   Overall AROM  Other (comment);Deficits   Ambulation/Gait   Ambulation/Gait Yes   Ambulation/Gait Assistance 5: Supervision  occasional colliding with door or unsteadiness toward left   Ambulation Distance (Feet) 200 Feet   Assistive device None   Gait Pattern Within Functional Limits  unsteadiness   Gait velocity 4.07 ft/sec   Stairs Yes   Stairs Assistance 5: Supervision   Stairs Assistance Details (indicate cue type and reason) supervision due to unsteadiness without rail, left sway with descending   Stair Management Technique Alternating pattern   Number of Stairs 4   Height of Stairs --  reports she is unable to step up onto a steep step: ie Bus    Door Management 5: Supervision  occasionally collides   Standardized Balance Assessment   Standardized Balance Assessment Dynamic Gait Index;Timed Up and Go Test;Vestibular Evaluation   Dynamic Gait Index   Level Surface Normal   Change in Gait Speed Normal   Gait with Horizontal Head Turns Mild Impairment   Gait with Vertical Head Turns Normal   Gait and Pivot Turn Normal   Step Over Obstacle Mild Impairment   Step Around Obstacles Normal   Steps Mild Impairment   Total Score 21   Timed Up and Go Test   TUG Normal TUG   Normal TUG (seconds) 7.18                            PT Short Term Goals - 01/27/14 1718    PT SHORT TERM GOAL #1   Title Demonstrate independence with home exercise program to address balance, gaze, and strength deficits.   PT SHORT TERM GOAL #2   Title Demonstrate ability to ambulate with horizontal head turns as in DGI without staggering outside of 2" path and with improved environmental scanning.   PT SHORT TERM GOAL #3   Title Demonstrate ability to  ascend and descend 4 stairs without rail independently.    PT SHORT TERM GOAL #4   Title Perform gaze x1 standing without retinal slip or reports of visual impairment.           PT Long Term Goals - 01/27/14 1721    PT LONG TERM GOAL #1   Title Verbalize understanding of fall prevention techniques in home environment. 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 Target: 03/27/14   PT LONG TERM GOAL #4   Title Increase FOTO ABC score by 13% for improved balance confidence.    PT LONG TERM GOAL #5   Title Demonstrate ability to enter and exit 5 open doorways without colliding with doorway, and to negotiate cluttered space without collision.               Plan - 2014/02/05 1713    Clinical Impression Statement Pt has multi-faceted balance/gait impairments which are affected by decreased neck AROM which decreased visual scanning and decreases simulation of the vestibular system. She has impaired gaze x1 and saccades. She reports difficulty with depth perception which may also be related to [employer]. Her vitals must be monitored PRN due to cardiac problems. Pt will benefit from skilled PT intervention to address balance and gait impairements and improve strength and range of motion for increased functional independence.   Rehab Potential Good   PT Frequency 2x / week   PT Duration 8 weeks   PT Treatment/Interventions ADLs/Self Care Home Management;Therapeutic activities;Balance training;Therapeutic exercise;Gait training;Neuromuscular re-education;Stair training;Patient/family education;Manual techniques;Visual/perceptual remediation/compensation   PT Next Visit Plan HEP to include: gaze  stabilization, corner balance with eyes closed, neck stretching, walking with head turns, single leg stance, squats vs single leg squats          G-Codes - Feb 05, 2014 1725    Functional Assessment Tool Used Therapist judgement with pt unsteady on stairs without a rail with significant left lateral sway   Functional Limitation Mobility: Walking and moving around   Mobility: Walking and Moving Around Current Status [phone]) At least 20 percent but less than 40 percent impaired, limited or restricted   Mobility: Walking and Moving Around Goal Status [phone]) At least 1 percent but less than 20 percent impaired, limited or restricted       Problem List Patient Active Problem List   Diagnosis Date Noted  . Memory deficits 03/04/2013  . Mental status change 01/28/2013  . Constipation 04/22/2012  . DVT (deep venous thrombosis) 11/21/2011  . Aphasia 08/03/2011  . Muscle weakness (generalized) 08/03/2011  . Abnormality of gait 08/03/2011  . Migraine without aura, without mention of intractable migraine without mention of status migrainosus 08/03/2011  . Encounter for therapeutic drug monitoring 08/03/2011  . Alopecia 03/09/2011  . TMJ syndrome 10/27/2010  . CARDIAC PACEMAKER DDD MDT 03/29/2010  . SVT/ PSVT/ PAT 07/28/2008  . ATRIAL FIBRILLATION 06/11/2008  . BRADYCARDIA-TACHYCARDIA SYNDROME 05/16/2007  . TRANSIENT ISCHEMIC ATTACK 05/16/2007  . EHLERS-DANLOS SYNDROME 05/16/2007  . SYNCOPE 05/16/2007  . HYPERCHOLESTEROLEMIA 05/15/2007  . CEREBROVASCULAR DISEASE 05/15/2007  . HYPOTHYROIDISM 02-06-2007  . DEPRESSION 02/06/07  . MITRAL VALVE PROLAPSE Feb 06, 2007  . FIBROMYALGIA 02/06/07  . DYSAUTONOMIA Feb 06, 2007    Delrae Sawyers D 02/05/2014, 5:29 PM  [facility] [address] [place], [place], [address] Phone: [phone]   Fax:  [phone]

## 2014-01-28 ENCOUNTER — Ambulatory Visit: Payer: No Typology Code available for payment source

## 2014-01-28 DIAGNOSIS — R279 Unspecified lack of coordination: Secondary | ICD-10-CM

## 2014-01-28 DIAGNOSIS — R29898 Other symptoms and signs involving the musculoskeletal system: Secondary | ICD-10-CM

## 2014-01-28 DIAGNOSIS — R2689 Other abnormalities of gait and mobility: Secondary | ICD-10-CM

## 2014-01-28 NOTE — Patient Instructions (Signed)
Feet Apart (Compliant Surface) Head Motion - Eyes Closed   Stand on compliant surface: two stacked pillows,  In a corner with a chair in front of you (to hold on if needed), with feet 6-8" width apart. Close eyes and move head slowly, 10x side to side and 10x up and down.   Copyright  VHI. All rights reserved.    Mini Squat: Single Leg   Stand on right foot. Reach forward for balance and do a mini squat. Keep knees in line with second toe. Knees do not go past toes. Keep knees together. Repeat 10 times. Repeat with other leg for set. Rest 10 seconds after set. Do 2-3 sets per session.  http://plyo.exer.us/72   Copyright  VHI. All rights reserved.  Walking Head Turn   Standing close to a wall or counter top, walk 10 feet while turning head side to side every two steps. Touch wall if necessary to keep balance. Repeat 3 times. Do 1 sessions per day.  http://gt2.exer.us/536   Copyright  VHI. All rights reserved.   Gaze Stabilization: Sitting   Keeping eyes on target on wall 5 feet away, tilt head down 15-30 and move head side to side for 30 seconds. Repeat while moving head up and down for 30 seconds. Do 3 sessions per day.   Copyright  VHI. All rights reserved.  Side-Bending   Sit on right hand with palm up. Left hand on opposite side of head, pull head to side as far as is comfortable until you feel a gentle stretch. Stop if there is pain. Hold 30 seconds. Repeat with other hand to other side. Repeat 3 times. Do 1-2 Gaze Stabilization: Sitting     Copyright  VHI. All rights reserved.  AROM, Rotation with Self-Assist   Lay on back, one hand on same-side jaw. Turn head slowly to look over shoulder. Increase stretch by gently pushing with hand on jaw. Hold 30 seconds.  Repeat 3 times per session on right and left. Do 1-2 sessions per day.    Delrae Sawyers, PT,DPT,NCS 01/28/2014 9:32 AM Phone [phone] FAX [phone]

## 2014-01-28 NOTE — Therapy (Signed)
[facility] [address] [place], [place], [address] Phone: [phone]   Fax:  [phone]  Physical Therapy Treatment  Patient Details  Name: Amy Lane MRN: [id number] Date of Birth: [birth year]  Encounter Date: 01/28/2014      PT End of Session - 01/28/14 1224    Visit Number 2   Number of Visits 17   Date for PT Re-Evaluation 03/27/14   Authorization Type [employer] with G code every 10th visit   PT Start Time [phone]   PT Stop Time 0930   PT Time Calculation (min) 43 min      Past Medical History  Diagnosis Date  . Herpes zoster   . SVT (supraventricular tachycardia)   . PSVT (paroxysmal supraventricular tachycardia)   . PAT (paroxysmal atrial tachycardia)   . History of tachycardia-bradycardia syndrome   . Atrial fibrillation   . Dysautonomia   . History of syncope   . Cardiac pacemaker     DDD  MDT  . Long term (current) use of anticoagulants   . Allergic rhinitis   . Mitral valve prolapse   . Cerebrovascular disease   . Hypercholesteremia   . Hypothyroidism   . Hemorrhoids   . Ehler's-Danlos syndrome   . Fibromyalgia   . History of transient ischemic attack   . Depression   . Carcinomas, basal cell   . Skin desquamation     inflammative vaginitis  . Abnormal gait   . Hypercholesterolemia   . Headache(784.0)     Migraine    Past Surgical History  Procedure Laterality Date  . Complex mitral valve repair      at the [facility]  . Left carotid endarterectomy  11/2003    by Dr. Delton See  . Pacemaker insertion      medtronic kappa 901  . Removed chalazion from left eye lid  09/2011    Dr. Satira Sark    There were no vitals taken for this visit.  Visit Diagnosis:  Lack of coordination  Decreased ROM of neck  Abnormality of gait due to impairment of balance      Subjective Assessment - 01/28/14 0849    Symptoms (p) Feeling about the same as eval. No changes           [facility] PT Assessment - 01/27/14 0755    Assessment   Medical Diagnosis Abnormality of gait and muscle weakness   Onset Date --  2 years ago   Prior Therapy none   Precautions   Precautions ICD/Pacemaker;Fall  Monitor vitals PRN (monitor shortness of breath)   Precaution Comments Pt has occasional cardiac "spells" in which she is symptomatic with shortness of breath and must lay flat for it to resolve. If it doesn't resolve, she knows to call [facility] [place] Private residence   Living Arrangements Spouse/significant other   Available Help at Discharge Family   Type of 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   Vocation Requirements lifting, walking around all day,  staging the floor at the store    Leisure traveling, decorating, furniture, shopping   Coordination   Gross Motor Movements are Fluid and Coordinated Yes   Finger Nose Finger Test normal   Heel Shin Test normal   Functional Tests   Functional tests --  5x sit to stand 20.56   Posture/Postural Control   Posture/Postural Control No significant limitations   AROM   Overall AROM  Other (comment);Deficits   Ambulation/Gait   Ambulation/Gait Yes   Ambulation/Gait Assistance 5: Supervision  occasional colliding with door or unsteadiness toward left   Ambulation Distance (Feet) 200 Feet   Assistive device None   Gait Pattern Within Functional Limits  unsteadiness   Gait velocity 4.07 ft/sec   Stairs Yes   Stairs Assistance 5: Supervision   Stairs Assistance Details  (indicate cue type and reason) supervision due to unsteadiness without rail, left sway with descending   Stair Management Technique Alternating pattern   Number of Stairs 4   Height of Stairs --  reports she is unable to step up onto a steep step: ie Bus    Door Management 5: Supervision  occasionally collides   Standardized Balance Assessment   Standardized Balance Assessment Dynamic Gait Index;Timed Up and Go Test;Vestibular Evaluation   Dynamic Gait Index   Level Surface Normal   Change in Gait Speed Normal   Gait with Horizontal Head Turns Mild Impairment   Gait with Vertical Head Turns Normal   Gait and Pivot Turn Normal   Step Over Obstacle Mild Impairment   Step Around Obstacles Normal   Steps Mild Impairment   Total Score 21   Timed Up and Go Test   TUG Normal TUG   Normal TUG (seconds) 7.18       The patient was taught, performed, and was provided with a home exercise program to address neck flexibility and balance and quad strength. See pt instructions for details of therex and neuro re-ed   Manual therapy: manual cervical distraction, soft tissue massage of upper and middle traps, scalenes, sternocleidomastoid, and distraction+PROM into sidebending and rotation, and distraction +AROM into sidebending and rotation. All to patient tolerance with no complaints by patient.                   PT Education - 01/28/14 1223    Education provided Yes   Education Details HEP   Person(s) Educated Patient   Methods Explanation;Demonstration;Handout   Comprehension Verbalized understanding;Returned demonstration          PT Short Term Goals - 01/27/14 1718    PT SHORT TERM GOAL #1   Title Demonstrate independence with home exercise program to address balance, gaze, and strength deficits.   PT SHORT TERM GOAL #2   Title Demonstrate ability to ambulate with horizontal head turns as in DGI without staggering outside of 2" path and with improved environmental  scanning.   PT SHORT TERM GOAL #3   Title Demonstrate ability to ascend and descend 4 stairs without rail independently.    PT SHORT TERM GOAL #4   Title Perform gaze x1 standing without retinal slip or reports of visual impairment.           PT Long Term Goals - 01/27/14 1721    PT LONG TERM GOAL #1   Title Verbalize understanding of fall prevention techniques in home environment. [place]: 03/27/14   PT LONG TERM GOAL #2   Title Verbalize increased ease with negotiating a deep step to  enter a tour bus.[place]: 03/27/14   PT LONG TERM GOAL #3   Title Ascend and descend 4 stairs without rail with varied weighted objects in hands with good coordination and no loss of balance, supervision as needed, for improved function at work. Target: 03/27/14   PT LONG TERM GOAL #4   Title Increase FOTO ABC score by 13% for improved balance confidence.    PT LONG TERM GOAL #5   Title Demonstrate ability to enter and exit 5 open doorways without colliding with doorway, and to negotiate cluttered space without collision.               Plan - 01/28/14 1224    Clinical Impression Statement Pt demonstrated improved neck rotation AROM following stretching and manual therapy today. Continue per plan of care   PT Next Visit Plan Review HEP and continue with manual therapy and neck stretching, quads strength and dynamic balance on compliant surfaces with eyes open and closed          G-Codes - 03-Feb-2014 1725    Functional Assessment Tool Used Therapist judgement with pt unsteady on stairs without a rail with significant left lateral sway   Functional Limitation Mobility: Walking and moving around   Mobility: Walking and Moving Around Current Status [phone]) At least 20 percent but less than 40 percent impaired, limited or restricted   Mobility: Walking and Moving Around Goal Status [phone]) At least 1 percent but less than 20 percent impaired, limited or restricted      Problem List Patient Active  Problem List   Diagnosis Date Noted  . Memory deficits 03/04/2013  . Mental status change 01/28/2013  . Constipation 04/22/2012  . DVT (deep venous thrombosis) 11/21/2011  . Aphasia 08/03/2011  . Muscle weakness (generalized) 08/03/2011  . Abnormality of gait 08/03/2011  . Migraine without aura, without mention of intractable migraine without mention of status migrainosus 08/03/2011  . Encounter for therapeutic drug monitoring 08/03/2011  . Alopecia 03/09/2011  . TMJ syndrome 10/27/2010  . CARDIAC PACEMAKER DDD MDT 03/29/2010  . SVT/ PSVT/ PAT 07/28/2008  . ATRIAL FIBRILLATION 06/11/2008  . BRADYCARDIA-TACHYCARDIA SYNDROME 05/16/2007  . TRANSIENT ISCHEMIC ATTACK 05/16/2007  . EHLERS-DANLOS SYNDROME 05/16/2007  . SYNCOPE 05/16/2007  . HYPERCHOLESTEROLEMIA 05/15/2007  . CEREBROVASCULAR DISEASE 05/15/2007  . HYPOTHYROIDISM 02-04-07  . DEPRESSION 02/04/07  . MITRAL VALVE PROLAPSE 2007/02/04  . FIBROMYALGIA 2007-02-04  . DYSAUTONOMIA 02-04-07    Delrae Sawyers D 01/28/2014, 12:29 PM  [facility] [address] [place], [place], [address] Phone: [phone]   Fax:  [phone]

## 2014-02-04 ENCOUNTER — Ambulatory Visit: Payer: No Typology Code available for payment source

## 2014-02-05 ENCOUNTER — Encounter: Payer: Self-pay | Admitting: Internal Medicine

## 2014-02-06 ENCOUNTER — Ambulatory Visit: Payer: No Typology Code available for payment source

## 2014-02-06 ENCOUNTER — Encounter: Payer: Self-pay | Admitting: Cardiology

## 2014-02-16 ENCOUNTER — Telehealth: Payer: Self-pay | Admitting: Pulmonary Disease

## 2014-02-16 NOTE — Telephone Encounter (Signed)
lmomtcb x 2  

## 2014-02-16 NOTE — Telephone Encounter (Signed)
lmomtcb x1 

## 2014-02-16 NOTE — Telephone Encounter (Signed)
Pt returned call. [phone]

## 2014-02-17 NOTE — Telephone Encounter (Signed)
lmtcb

## 2014-02-17 NOTE — Telephone Encounter (Signed)
° °  Pt returned call [phone]

## 2014-02-17 NOTE — Telephone Encounter (Signed)
lmtcb X1.  Pt just needs appt with SN scheduled, fyi.

## 2014-02-18 NOTE — Telephone Encounter (Signed)
Pt aware. Nothing further needed at this time.  °

## 2014-02-18 NOTE — Telephone Encounter (Signed)
lmtcb for pt.  

## 2014-02-18 NOTE — Telephone Encounter (Signed)
Pt has upcoming appt 04/06/14 with SN.  Pt wanting to know if she needs to be sooner or if this will be okay to keep as scheduled.  Please advise. Thanks.

## 2014-02-18 NOTE — Telephone Encounter (Signed)
Per SN---  Ok to keep her scheduled appt in march with SN.  thanks

## 2014-03-05 ENCOUNTER — Other Ambulatory Visit (HOSPITAL_COMMUNITY): Payer: Self-pay | Admitting: Cardiology

## 2014-03-05 DIAGNOSIS — I6523 Occlusion and stenosis of bilateral carotid arteries: Secondary | ICD-10-CM

## 2014-03-09 ENCOUNTER — Encounter (HOSPITAL_COMMUNITY): Payer: No Typology Code available for payment source

## 2014-03-14 ENCOUNTER — Other Ambulatory Visit: Payer: Self-pay | Admitting: Cardiovascular Disease

## 2014-03-16 ENCOUNTER — Ambulatory Visit (HOSPITAL_COMMUNITY): Payer: No Typology Code available for payment source | Attending: Cardiovascular Disease | Admitting: Cardiology

## 2014-03-16 DIAGNOSIS — I6523 Occlusion and stenosis of bilateral carotid arteries: Secondary | ICD-10-CM | POA: Diagnosis not present

## 2014-03-16 NOTE — Progress Notes (Signed)
Carotid duplex performed 

## 2014-03-18 ENCOUNTER — Other Ambulatory Visit: Payer: Self-pay | Admitting: Cardiovascular Disease

## 2014-03-23 ENCOUNTER — Telehealth: Payer: Self-pay | Admitting: Pulmonary Disease

## 2014-03-23 NOTE — Progress Notes (Signed)
Quick Note:  Called and spoke to pt. Informed pt of the results and recs per SN. Pt verbalized understanding and denied any further questions or concerns at this time. ______

## 2014-03-23 NOTE — Telephone Encounter (Signed)
Notes Recorded by Noralee Space, MD on 03/18/2014 at 9:13 AM Please notify patient>  CDopplers are STABLE- mild plaque bilat, no signif blockages, no change from last yr; Rec repeat in 48yr...   Called and spoke to pt. Informed pt of the results and recs per SN. Pt verbalized understanding and denied any further questions or concerns at this time.

## 2014-04-06 ENCOUNTER — Other Ambulatory Visit (INDEPENDENT_AMBULATORY_CARE_PROVIDER_SITE_OTHER): Payer: No Typology Code available for payment source

## 2014-04-06 ENCOUNTER — Ambulatory Visit (INDEPENDENT_AMBULATORY_CARE_PROVIDER_SITE_OTHER): Payer: No Typology Code available for payment source | Admitting: Pulmonary Disease

## 2014-04-06 ENCOUNTER — Encounter: Payer: Self-pay | Admitting: Pulmonary Disease

## 2014-04-06 VITALS — BP 110/66 | HR 67 | Temp 97.8°F | Ht 70.0 in | Wt 142.4 lb

## 2014-04-06 DIAGNOSIS — E7800 Pure hypercholesterolemia, unspecified: Secondary | ICD-10-CM

## 2014-04-06 DIAGNOSIS — M79642 Pain in left hand: Secondary | ICD-10-CM

## 2014-04-06 DIAGNOSIS — Z950 Presence of cardiac pacemaker: Secondary | ICD-10-CM

## 2014-04-06 DIAGNOSIS — Q796 Ehlers-Danlos syndrome, unspecified: Secondary | ICD-10-CM

## 2014-04-06 DIAGNOSIS — I059 Rheumatic mitral valve disease, unspecified: Secondary | ICD-10-CM

## 2014-04-06 DIAGNOSIS — R269 Unspecified abnormalities of gait and mobility: Secondary | ICD-10-CM

## 2014-04-06 DIAGNOSIS — R55 Syncope and collapse: Secondary | ICD-10-CM

## 2014-04-06 DIAGNOSIS — M79641 Pain in right hand: Secondary | ICD-10-CM | POA: Diagnosis not present

## 2014-04-06 DIAGNOSIS — I495 Sick sinus syndrome: Secondary | ICD-10-CM

## 2014-04-06 DIAGNOSIS — F329 Major depressive disorder, single episode, unspecified: Secondary | ICD-10-CM

## 2014-04-06 DIAGNOSIS — F32A Depression, unspecified: Secondary | ICD-10-CM

## 2014-04-06 DIAGNOSIS — E039 Hypothyroidism, unspecified: Secondary | ICD-10-CM

## 2014-04-06 LAB — HEPATIC FUNCTION PANEL
ALT: 15 U/L (ref 0–35)
AST: 21 U/L (ref 0–37)
Albumin: 4.2 g/dL (ref 3.5–5.2)
Alkaline Phosphatase: 68 U/L (ref 39–117)
BILIRUBIN DIRECT: 0.1 mg/dL (ref 0.0–0.3)
TOTAL PROTEIN: 7.2 g/dL (ref 6.0–8.3)
Total Bilirubin: 0.4 mg/dL (ref 0.2–1.2)

## 2014-04-06 LAB — LIPID PANEL
Cholesterol: 167 mg/dL (ref 0–200)
HDL: 60.6 mg/dL (ref >39.00)
LDL Cholesterol: 93 mg/dL (ref 0–99)
NonHDL: 106.4
TRIGLYCERIDES: 66 mg/dL (ref 0.0–149.0)
Total CHOL/HDL Ratio: 3
VLDL: 13.2 mg/dL (ref 0.0–40.0)

## 2014-04-06 LAB — CBC WITH DIFFERENTIAL/PLATELET
BASOS PCT: 0.4 % (ref 0.0–3.0)
Basophils Absolute: 0 10*3/uL (ref 0.0–0.1)
EOS PCT: 1.5 % (ref 0.0–5.0)
Eosinophils Absolute: 0.1 10*3/uL (ref 0.0–0.7)
HCT: 38.2 % (ref 36.0–46.0)
Hemoglobin: 13 g/dL (ref 12.0–15.0)
LYMPHS ABS: 0.9 10*3/uL (ref 0.7–4.0)
Lymphocytes Relative: 19 % (ref 12.0–46.0)
MCHC: 34.1 g/dL (ref 30.0–36.0)
MCV: 90.4 fl (ref 78.0–100.0)
MONOS PCT: 8.3 % (ref 3.0–12.0)
Monocytes Absolute: 0.4 10*3/uL (ref 0.1–1.0)
Neutro Abs: 3.4 10*3/uL (ref 1.4–7.7)
Neutrophils Relative %: 70.8 % (ref 43.0–77.0)
Platelets: 143 10*3/uL — ABNORMAL LOW (ref 150.0–400.0)
RBC: 4.23 Mil/uL (ref 3.87–5.11)
RDW: 13.6 % (ref 11.5–15.5)
WBC: 4.8 10*3/uL (ref 4.0–10.5)

## 2014-04-06 LAB — BASIC METABOLIC PANEL
BUN: 10 mg/dL (ref 6–23)
CALCIUM: 9.7 mg/dL (ref 8.4–10.5)
CO2: 30 mEq/L (ref 19–32)
Chloride: 102 mEq/L (ref 96–112)
Creatinine, Ser: 0.74 mg/dL (ref 0.40–1.20)
GFR: 84.62 mL/min (ref >60.00)
GLUCOSE: 94 mg/dL (ref 70–99)
POTASSIUM: 4.6 meq/L (ref 3.5–5.1)
Sodium: 135 mEq/L (ref 135–145)

## 2014-04-06 LAB — SEDIMENTATION RATE: SED RATE: 15 mm/h (ref 0–22)

## 2014-04-06 MED ORDER — TRAMADOL HCL 50 MG PO TABS: 50.0000 mg | ORAL_TABLET | Freq: Three times a day (TID) | ORAL | Status: DC | PRN

## 2014-04-06 NOTE — Progress Notes (Signed)
Subjective:    Patient ID: CHERYLYNN LISZEWSKI, female    DOB: 1950/02/14, 64 y.o.   MRN: [id number]  HPI 64 y/o WF here for a follow up visit... she has mult med problems as noted below... she is followed regularly by Drs Maryellen Pile for Cardiology, and Drs Jeri Modena for GI... ~  SEE PREV EPIC NOTES FOR OLDER DATA >>   ~  May 21, 2012:  36moROV & Pat has had a good interval- notes some recent allergy symptoms but OTC Benedryl is helping; she is also c/o some constipation & we discussed regular dosing w/ Miralax & Senakot-S; also notes some "clumsiness, weakness in legs, and balance off" stating this has been off & on for yrs & she would like further eval- refer to Neuro per request... We reviewed the following medical problems during today's office visit >>     AR> on Benedryl & Nasonex prn; breathing is good w/o cough, sput, SOB, etc...    Hx Dysautonomia (POTS) & Ehlers-Danlos syndrome> followed by DCherly Hensen& DrKlein; stable on current meds...    MVP, Myxomatous valve- s/p MVrepair (in [place])> on ASA81; she is followed by DrKlein- seen 3/14 & stable pacer & Coumadin, no changes made...    Hx Tachy-brady, Fib-flutter w/ ablation & pacer> on Sectral200/d, & Coumadin; she does her own Protimes at home & called to the CC for adjustments...     [facility]> [facility] w/ [facility]ation, & f/u CDopplers all ok...    ?DVT & ?PE while on Coumadin (during trip to [place])> she now [place]    CHOL> on Niacin500; FLP 4/14 shows TChol 233, TG 78, HDL 75, LDL 146; she is intol to all meds- offered lipid clinic referral...    Hypothy> on Synthroid75; lab 4/14 shows TSH= 3.10; continue same meds...    GI- Constip & Hems> on Align; c/o constip & asked to start Miralax, Senakot-S, added fiber; last colon 2009 by DrJacobs w/ hems only & f/u planned 175yr..    GYN- kraurrosis vulvae (DIV)> local GYN,  [facility] vulva clinic treatment & she is up to date on screening...    Migraine HAs & ?TIA> [facility] DrWillis...    Anxiety/ Depression> on Ativan 15m67mrn & off prev Prozac20 (it was really helping but "I'm past that now" & she felt this contrib to incr appetite & wt gain (Note- 130# & BMI=18 but she states "I'm up 3 pant sizes")... We reviewed prob list, meds, xrays and labs> see below for updates >>   LABS 4/14:  FLP- not at goals on diet alone but intol to all meds;  Chems- wnl;  CBC- wnl;  TSH=3.10;  VitD=19 & Rec VitD OTC supplement ~2000u daily...  ~  April 11, 2013:  115m44mo & [facility]/ HA & altered mental status- ?TIA vs complex migraine- CT Head was neg; CDopplers were ok; 2DEcho showed norm LVF, myxomatous MV s/p repair no resid MR; Coumadin was continued and ASA81 was added... She had ROV DrWillis 2/15> he felt the episode was a migraine equiv & no recurrent prob since then, she notes mild memory impairment w/ MMSE= 29/30... She notes that she's been under a lot of stress w/ father-in-law illness, husb w/ prostate ca, sold house & had to move... Now feeling much better w/ great attitude- "  today is a great day"... We reviewed the following medical problems during today's office visit >>     AR> on Benedryl & Nasonex prn; breathing is good w/o cough, sput, SOB, etc...    Hx Dysautonomia (POTS) & Ehlers-Danlos syndrome> followed by Cherly Hensen & DrKlein; on ASA81 + Coumadin but talking about switch to NOAC...    MVP, Myxomatous valve- s/p MVrepair (in [place])> on ASA81, Coumadin; she is followed by DrKlein- seen 3/15 & stable pacer & no changes made...    Hx Tachy-brady, Fib-flutter w/ ablation & pacer> on Sectral200/d, & Coumadin; she does her own Protimes at home & called to the CC for adjustments...     Cerebrovasc Dis> on ASA81, s/p left CAE 2005 by [facility] for asymptomatic left carotid stenosis w/ ulceration, & f/u CDopplers all ok...     ?DVT & ?PE while on Coumadin (during trip to [place])> she now checks her own protimes like they do in [place]...    CHOL> on Zetia10 since 1/15 Hosp where LDL was 155; FLP on Zetia10 3/15 showed TChol 172, TG 74, HDL 59, LDL 89; she is intol to all other meds...    Hypothy> on Synthroid75; lab 3/15 shows TSH= 4.99; continue same meds, take pill first thing in morn, etc...    GI- Constip & Hems> on Align; c/o constip & asked to start Miralax, Senakot-S, added fiber; last colon 2009 by DrJacobs w/ hems only & f/u planned 45yr...    GYN- kraurosis vulvae (DIV)> local GYN, [facility]va [facility] treatment & she is up to date on screening...    Migraine HAs & ?TIA> [facility]; Hosp 1/15 w/ another episode, symptoms cleared, ASA81 added to her Coumadin...    Anxiety/ Depression> on Ativan 148mprn & off prev Prozac20 (it was really helping but "I'm past that now" & she felt this contrib to incr appetite & wt gain (Note- 134# & BMI=18 but she states "I'm up 3 pant sizes")... We reviewed prob list, meds, xrays and labs> see below for updates >>   LABS 4/15:  FLP- at goals on Zetia10;  Chems- wnl;  CBC- wnl;  TSH=4.99...  ~  October 06, 2013:  64m57moV & PatFraser Dinports that she is doing well, CC= pain in hands & fingers (esp in the AM) w/ some swelling; she feels this is arthritis & asked to try OTC NSAID at bedtime to see if this helps... 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 We reviewed the following medical problems during today's office visit >>     AR> on Benedryl OTC & Nasonex prn; breathing is good w/o cough, sput, SOB, etc...    Hx Dysautonomia (POTS) & Ehlers-Danlos syndrome> followed by DrNCherly HensenDrKlein; on ASA81 + Eliquis5Bid...    MVP, Myxomatous valve- s/p MVrepair (in [place])> on ASA81, Eliquis5Bid; she is followed by DrKlein- seen 3/15 & stable pacer & no changes made...    Hx Tachy-brady, Fib-flutter w/ ablation & pacer> on  Sectral200/d, & Eliquis5Bid; she does her own Protimes at home & called to the CC for adjustments...     Cerebrovasc Dis> on ASA81, s/p [facility] asymptomatic left carotid stenosis w/ ulceration, & f/u CDopplers all ok...    ?DVT & ?PE while on Coumadin (during trip to [place])> she then checked her [place]times [place]    CHOL> on Zetia10 since 1/15 Hosp where LDL  was 155; FLP on Zetia10 3/15 showed TChol 172, TG 74, HDL 59, LDL 89; she is intol to all other meds...    Hypothy> on Synthroid75; lab 3/15 shows TSH= 4.99; continue same meds, take pill first thing in morn, etc...    GI- Constip & Hems> on Align; c/o constip & asked to start Miralax, Senakot-S, added fiber; last colon 2009 by DrJacobs w/ hems only & f/u planned 37yr...    GYN- kraurosis vulvae (DIV)> local GYN, [facility] & she is up to date on screening; Hx enterococcus UTI...    Migraine HAs & ?TIA> [facility]; Hosp 1/15 w/ another episode, symptoms cleared, ASA81 added to her Coumadin which was later changed to Eliquis5Bid...    Anxiety/ Depression> on Ativan 150mprn & started on Celexa20 which she really thinks is helping; wt stable (Note- 134# & BMI=18 but she states "I'm up 3 pant sizes")... We reviewed prob list, meds, xrays and labs> see below for updates >> OK Flu vaccine today...  ~  April 06, 2014:  45m635moVAventuras been under some stress- husb (LaFritz Pickerelas Dx [facility]/ [facility] ([place] therapy); he is currently improved & [place]... Pat's CC today is some continued discomfort (pain/ swelling) in her hands & this was only min better w/ OTC NSAIDs; we discussed trial of Tramadol50, basic lab eval & consider referral to Rheumatology for further eval... She is also concerned about an 8# wt gain to 142lbs  (BMI is still only 20)... We reviewed the following medical problems during today's office visit >>     AR> on Benedryl OTC & Nasonex prn; breathing is good w/o cough, sput, SOB, etc...    Hx Dysautonomia (POTS) & Ehlers-Danlos syndrome> followed by DrNiCherly HensenrKlein; BP= 110/66 w/o postural change, on Eliquis5Bid...    MVP, Myxomatous valve- s/p MVrepair (in [place])> on Eliquis5Bid; she is followed by DrKlein- seen 3/15 & stable pacer & no changes made...    Hx Tachy-brady, Fib-flutter w/ ablation & pacer> on Sectral200/d, & Eliquis5Bid; she does her own Protimes at home & called to the CC for adjustments...     Cerebrovasc Dis> on Eliquis5Bid, s/[facility] left carotid stenosis w/ ulceration, & f/u CDopplers all ok...    ?DVT & ?PE while on Coumadin (during trip to [place])> she then checked her [place]   CHOL> on Zetia10 since 1/15 Hosp where LDL was 155; FLP on Zetia10 3/16 shows TChol 167, TG 66, HDL 61, LDL 93; she is intol to all other meds...    Hypothy> on Synthroid75; lab 3/15 shows TSH= 4.99; continue same meds, take pill first thing in morn, etc...    Underweight> BMI in the 19-20 range & we discussed nutritional supplements etc...     GI- Constip & Hems> on Align; c/o constip & asked to take Miralax, Senakot-S, added fiber; last colon 2009 by DrJacobs w/ hems only & f/u planned 49yr53yr   GYN- kraurosis vulvae (DIV)> local GYN, [facility] & she is up to date on screening; Hx enterococcus UTI...    Migraine HAs & ?TIA> [facility]; Hosp 1/15 w/ another episode, symptoms cleared, ASA81 added to her Coumadin  which was later changed to Eliquis5Bid; she had c/o some leg weakness but EMG/NCV was normal & rec to try PT...    Anxiety/ Depression> on Ativan 52m prn & started on Celexa20 which she really thinks is helping...  We reviewed prob list, meds, xrays and labs> see below  for updates >>   LABS 3/16:  FLP- at goals on Zetia10;  Chems- wnl;  CBC- wnl;  TSH=3.24   Sed=15;  Anti-CCP=neg PLAN>> we decided to try warm soaks for hands and Tramadol50 prn; we will refer to Rheum for their opinion; continue other meds the same + her nutritional supplements etc...           Problem List:      ALLERGIC RHINITIS (ICD-477.9) - we discussed Rx w/ Zyrtek, Astepro Prn, etc... ~  CXR 3/11 showed left pacer, sternal wires, NAD..Marland Kitchen ~  CXR 6/13 showed stable post-op changes and pacer, pulm hyperinflation & scattered scarringt, calcif mediastinal nodes, no focal opacities, NAD..Marland Kitchen ~  CXR 1/15 showed norm heart size, post op changes & pacer, no adenop, no edema, clear x some left apical pleural thickening w/o change...  MITRAL VALVE PROLAPSE (ICD-424.0) - followed by DCherly Hensen.. ~  2DEcho 11/08 showed myxomatous MV, annuloplasty ring, decr post leaflet excursion, norm LVH & wall motion... ~  NuclearStressTest 11/08 showed mild apical thinning... prev study 12/04 w/o ischemia or infarct & EF=68%... ~  she sees DBurkina Fasoevery 6 months- for [facility], dysautonomia (s/p pacer), & Ehlers-Danlos Syndrome> last seen 1/15 & note reviewed, EKG w/ irreg rhythm, borderline QT interval... ~  2DEcho 1/15> cavity size was normal, wall thickness was normal w/ norm systolic function was normal & EF= 55% to 60%, myxomatous MV s/p repair with no residual MR, LA is mildly dilated.  Hx of BRADYCARDIA-TACHYCARDIA SYNDROME (ICD-427.81) - on SECTRAL 2019md & COUMADIN via CC... hx AFib/Flutter w/ ablation & pacemaker placed ... she has Ehlers-Danlos synd w/ prolonged QT interval...  CARDIAC PACEMAKER IN SITU (ICD-V45.01) - f/u DrKlein w/ CTChest 4/09- neg. ~  dual chamber pacer changed 3/11 by DrKlein for end-of-life... ~  EKG shows NSR, 1st degree AVB, RBBB... ~  3/15: she had f/u DrKlein> note reviewed, pacer functioning well- no changes made but he wanted her to consider  a NOAC...  DYSAUTONOMIA (ICD-742.8) - prev on Proamatine 2m12mabs per DrKlein, but this was stopped...  CEREBROVASCULAR DISEASE (ICD-437.9) - on ASA 12m1m& COUMADIN (followed in the clinic)... ~  she [facility] symptomatic left carotid stenosis w/ ulceration... ~  Carotid angiogram 5/08 [facility] <30% right carotid stenosis ~  f/u CTA of Head & Neck 12/09 was neg= norm CTA head, & no signif carotid dis noted in neck. ~  CDoppler 2/11 showed stable mild carotid dis, left CAE w/ DPA is patent, 0-39% bilat ICA stenoses. ~  repear CDopplers 11/11 per DrNiCherly Hensenwed smooth plaque in right bulb & distal left CCA- stable; 0-39% bilat ICA stenoses... ~  CT Head 6/13 is neg- No acute intracranial abnormalities... ~  CTA Brain 6/13 showed mild atherosclerotic dis in the carotid siphon regions bilat w/o stenoses, otherw norm CTA brain w/o lesions... ~  CT Angio Neck 6/13 via ER showed mild nonstenotic arteriosclerotic dis at both carotid bifurcations w/o signif stenoses... ~  CDopplers 11/13 showed mild soft smooth plaque w/ 0-39% right ICAstenosis & 40-59% left ICA stenosis, vertebrals are antegrade... ~  CDopplers 1/15 showed 1-39% internal  carotid artery stenosis bilaterally & antegrade vertebral flow... ~  CDopplers 2/16 showed mixed plaque bilat, stable 1-39% bilat ICAstenoses w/ left CEA & DPA, norm subclav & patent vertebrals...  ?DVT & poss PTE while on vacation in [place] during the summer of 2012 >> pt was on Coumadin & INR was therapeutic; she had pain & swelling in left leg w/ a pos doppler reported by a [place] physician; she was also SOB & they questioned poss PTE but didn't get scan or further eval;  On return to [place] DrNishan wondered if she might have bled into the leg muscles & sent her to DrNorris for Ortho eval; he could not corroborate the theory & treated her w/ compression stockings & she gradually improved towards her  baseline...  HYPERCHOLESTEROLEMIA (ICD-272.0) - on diet alone now, prev on Lip20- this was stopped 1/12 by Cherly Hensen & she feels much better off this med! ~  [place] 05/13/07 on Lip20 showed TChol 119, TG 50, HDL 55, LDL 54 ~  FLP 5/10 on Lip20 showed TChol 120, TG 43, HDL 58, LDL 54 ~  FLP 9/11 on Lip20 showed TChol 130, TG 54, HDL 59, LDL 60 ~  FLP 5/12 on diet alone showed TChol 218, TG 91, HDL 57, LDL 135... Continue low chol, low fat diet. ~  FLP 10/12 showed TChol 231, TG 86, HDL 68, LDL 151... I rec the Lipid CLinic, & they started NIACIN 570m/d. ~  FLP 2/13 on Niacin500 showed TChol 199, TG 55, HDL 76, LDL 112... Much improved, continue same. ~  FLP 11/13 on diet alone showed TChol 231, TG 49, HDL 67, LDL 151..,. rec refer to Lipid clinic. ~  [place]4/14 on diet + Niacin showed TChol 233, TG 78, HDL 75, LDL 146  ~  FLP 1/15 in hosp showed Tchol 235, TG 79, HDL 64, LDL 155 => ZETIA 162md started... ~  [place] on Zetia10 showed TChol 172, TG 74, HDL 59, LDL 89... Continue same... ~  [place]/16 on Zetia10 showed TChol 167, TG 66, HDL 61, LDL 93  HYPOTHYROIDISM (ICD-244.9) - currently on SYNTHROID 7526md... ~  labs 4/09 on Levoth88 showed TSH = 0.10... rec to decr Synthroid to 56m35m... ~  labs 9/09 on Levoth75 showed TSH = 1.67 ~  labs 5/10 on Levoth75 showed TSH = 2.92... Continue same. ~  labs 9/11 on Levoth75 showed TSH= 1.37 ~  Labs 5/12 on Levothy75 showed TSH= 4.33 ~  Labs 10/12 on Levothy75 showed TSH= 1.51... Continue same. ~  Labs 11/13 on Levothy75 showed TSH= 1.55 ~  Labs 4/14 on Levothy75 showed TSH= 3.10 ~  Labs 3/15 on Levothy75 showed TSH= 4.99... Reminded to take every AM on empty stomach etc. ~  Labs 3/16 showed TSH= 3.24  HEMORRHOIDS (ICD-455.6) - last colonoscopy 2/09 by DrJacobs showed only sm hems... f/u 25yr79yrYN - followed by DrMcPhail and Dx w/ kraurosis vulvae 7/10... Prev on Premarin VagCream. ~  11/10: she discussed the Dx w/ me & will inquire about poss  hyst/ BSO... ~  4/11:  [facility] Erin Illene Bolused vulvar dermatitis & dryness w/ rec for "Crisco" Tid! 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 (ICD-729.1) - she has been doing Yoga classes and this has helped... ~  She saw DrNorris 9/14 w/ neck pain> CT CSpine showed  degen disc dis & he rec PT... ~  3/16: she is c/o persistent bilat hand pain & swelling, not much better w/ OTC NSAIDs; we decided to check Anti-CCP (neg) & Sed (15); we will Rx w/ Tramadol50Tid prn, hot soaks, & refer to Rheum for further eval...  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  Hx of TRANSIENT ISCHEMIC ATTACK (ICD-435.9) - on ASA 41m/d & COUMADIN> she has been followed by DrWillis; [facility] ~  1/12: f/u eval by DrWillis reviewed... ~  6/13: she presented w/ ?TIA symptoms & eval was neg; review by DrWillis indicated prob Migraine & everything resolved... ~  1/15: she had another episode, Hosp by [facility] x2d but felt to prob be Migraine related... ~  11/15: she had f/u appt w/ DrWillis> Hx Ehlers-Danlos, Migraines, & c/o some leg weakness (some difficulty rising from squat); he did EMG/NCV (neg-no myopathy, no neuropathy) & rec phys therapy  Hx of SYNCOPE (ICD-780.2) - felt to be neurally mediated, ? related to long QT, no recur since pacer placed...  MIGRAINE HEADACHE >> she presented w/ ?TIA symptoms 6/13 & eval was neg; review by DrWillis indicated prob Migraine & everything resolved, no recurrence... ~  Seen by Neuro 2/15 DrWillis w/ migraines, memory deficit, abn of gait;   DEPRESSION (ICD-311) - she takes LORAZEPAM 181mhs for insomnia... she [place]Gra[place]. ~  11/10: poor appetite & some wt loss prob related to depression & we discussed trial  Lexapro 103m... ~  3/11: she is very pleased- feels better, gained 5# w/ Ensure, etc... ~  9/11:  "I feel great" & Lexapro continued... ~  She was able to stop the Lexapro 1/12 (also stopped the Lipitor) & has done well off it ever since... ~  10/13:  She called w/ situational depression due to marital stress, getting counseling, etc; started PROZAC 18m35m& she reports much improved... ~  2015: she is off the Prozac and using Ativan 1mg 70m... ~  9/15: on Ativan1mg p14mand CELEXA20 called in several mo ago & she is much improved...  CARCINOMA, BASAL CELL (ICD-173.9) - [facility] ~  12/11:  She reports bx of rash on legs= lichen planus per DrHall, treated w/ topical ointment... ~  2/13:  She had a skin lesion removed from her neck= basal cell ca & wide excision performed by DrLupton.  Health Maintenance: ~  GI:  followed by DrJacobs w/ colonoscopy 2/09 showing only sm hems... ~  GYN:  followed by DrMcPhail, and Dr. Erin CI[place]ene Bol[place]... ~  Labs 9/11 showed Vit D level = 39 & rec to start 1000 u OTC supplement... ~  Immunizations:  she had TDAP 3/11... she gets yearly Flu vaccine in the Fall of the yr...    Past Surgical History  Procedure Laterality Date  . Complex mitral valve repair      [facility]  [facility]    by Dr. G. HayDelton Seecemaker insertion      medtronic kappa [id number]  . Removed chalazion from left eye lid  09/2011    Dr. TannerSatira Sarktpatient Encounter Prescriptions as of 04/06/2014  Medication Sig  . acebutolol (SECTRAL) 200 MG capsule Take 1 capsule (200 mg total) by mouth at bedtime.  . citalopram (CELEXA) 20 MG tablet START WITH 1 DAILY ---MAY INCREASE TO 2 DAILY  IF NECESSARY  . ELIQUIS 5 MG TABS tablet TAKE 1 TABLET (5 MG TOTAL) BY MOUTH 2 (TWO) TIMES DAILY.  Marland Kitchen levothyroxine (SYNTHROID, LEVOTHROID) 75 MCG tablet TAKE 1 TABLET BY MOUTH EVERY DAY  . LORazepam (ATIVAN) 1 MG  tablet TAKE 1 TABLET AT BEDTIME AS NEEDED FOR SLEEP  . polyethylene glycol (MIRALAX / GLYCOLAX) packet Take 17 g by mouth daily as needed.   . Probiotic Product (ALIGN) 4 MG CAPS Take 4 mg by mouth every morning.  . traMADol (ULTRAM) 50 MG tablet Take 1 tablet (50 mg total) by mouth 3 (three) times daily as needed for moderate pain.  Marland Kitchen tretinoin (RETIN-A) 0.1 % cream   . ZETIA 10 MG tablet TAKE 1 TABLET (10 MG TOTAL) BY MOUTH DAILY.  . [DISCONTINUED] senna (SENOKOT) 8.6 MG TABS tablet Take 1 tablet by mouth at bedtime as needed.     Allergies  Allergen Reactions  . Other Other (See Comments)    All QTc prolongation drugs  . Statins Other (See Comments)    myalgias    Current Medications, Allergies, Past Medical History, Past Surgical History, Family History, and Social History were reviewed in [employer] record.    Review of Systems         See HPI - all other systems neg except as noted... The patient complains of dyspnea on exertion.  The patient denies anorexia, fever, weight loss, weight gain, vision loss, decreased hearing, hoarseness, chest pain, syncope, peripheral edema, prolonged cough, headaches, hemoptysis, abdominal pain, melena, hematochezia, severe indigestion/heartburn, hematuria, incontinence, muscle weakness, suspicious skin lesions, transient blindness, difficulty walking, depression, unusual weight change, abnormal bleeding, enlarged lymph nodes, and angioedema.     Objective:   Physical Exam     WD, Thin, 64 y/o WF in NAD... she is 5'11"Tall and 141# = BMI~20... Vital Signs:  Reviewed... GENERAL:  Alert & oriented; pleasant & cooperative... HEENT:  [place]/AT, EOM-wnl, PERRLA, EACs-clear, TMs-wnl, NOSE-clear, THROAT-clear & wnl. NECK:  Supple w/ fair ROM; no JVD; normal carotid impulses w/o bruits, left CAE scar; no thyromegaly or nodules palpated; no lymphadenopathy. CHEST:  Clear to P & A; without wheezes/ rales/ or rhonchi heard... HEART:   Regular Rhythm; pacer on left, without murmurs/ rubs/ or gallops detected... ABDOMEN:  Soft & nontender; normal bowel sounds; no organomegaly or masses palpated... EXT: without deformities or arthritic changes; no varicose veins/ venous insuffic/ or edema. NEURO:  CN's intact;  no focal neuro deficits... DERM:  s/p skin cancer removed from left leg; min rash noted...  RADIOLOGY DATA:  Reviewed in the EPIC EMR & discussed w/ the patient...  LABORATORY DATA:  Reviewed in the EPIC EMR & discussed w/ the patient...   Assessment & Plan:    MVP/ MV Repair> Tachy-Brady/ PACER> Dysautonomia>  Followed by Cherly Hensen & DrKlein, doing well on Eliquis & Sectral, continue same...  Cerebrovasc Dis>  Off ASA now that she's on Eliquis, s/p left CAE 11/05, no recurrent cerebral ischemic symptoms & CDopplers stable...  ?DVT while in [place]> moot issue at present; prev on Coumadin now Eliquis; leg swelling diminished on the compression hose, no salt, elevation, etc; she is back to her exercise program & back to baseline...  CHOL>  She feels much better off the Lipitor & FLP improved w/ Zetia10 started 1/15 [facility]- continue same...  Hypothyroid>  Stable on the Synthroid 39mg/d...  GYN>  Stable & [facility]  EHLERS-DANLOS>  Aware, stable, she has been  feeling better & in great spirits...  Migraines> Hosp 6/13 & 1/15 w/ ?TIA but DrWillis thought likely Migraine syndrome- resolved, neg work up, no recurrence & they are following... Neuro> she mentions several symtoms that she is concerned about- clumsy, balance off, subjective leg weakness- & she is concerned; therefore refer to Neurology for eval...  Depression>  Off Prozac20 & on Celexa20 & Ativan prn; she & husb received counseling...  Other medical problems as noted...   Patient's Medications  New Prescriptions   TRAMADOL (ULTRAM) 50 MG TABLET    Take 1 tablet (50 mg total) by mouth 3 (three) times daily as needed  for moderate pain.  Previous Medications   ACEBUTOLOL (SECTRAL) 200 MG CAPSULE    Take 1 capsule (200 mg total) by mouth at bedtime.   CITALOPRAM (CELEXA) 20 MG TABLET    START WITH 1 DAILY ---MAY INCREASE TO 2 DAILY IF NECESSARY   ELIQUIS 5 MG TABS TABLET    TAKE 1 TABLET (5 MG TOTAL) BY MOUTH 2 (TWO) TIMES DAILY.   LEVOTHYROXINE (SYNTHROID, LEVOTHROID) 75 MCG TABLET    TAKE 1 TABLET BY MOUTH EVERY DAY   LORAZEPAM (ATIVAN) 1 MG TABLET    TAKE 1 TABLET AT BEDTIME AS NEEDED FOR SLEEP   POLYETHYLENE GLYCOL (MIRALAX / GLYCOLAX) PACKET    Take 17 g by mouth daily as needed.    PROBIOTIC PRODUCT (ALIGN) 4 MG CAPS    Take 4 mg by mouth every morning.   TRETINOIN (RETIN-A) 0.1 % CREAM       ZETIA 10 MG TABLET    TAKE 1 TABLET (10 MG TOTAL) BY MOUTH DAILY.  Modified Medications   No medications on file  Discontinued Medications   SENNA (SENOKOT) 8.6 MG TABS TABLET    Take 1 tablet by mouth at bedtime as needed.

## 2014-04-06 NOTE — Patient Instructions (Signed)
Today we updated your med list in our EPIC system...    Continue your current medications the same...  Today we did your FASTING blood work...    We will contact you w/ the results when available...   We wrote a new prescription for TRAMADOL 50mg  - take one tab up to 3 times daily as needed for pain...    Soak your hands in hot water as needed...    We will arrange for a Rheumatology consult for further eval...  Call for any questions or if we can be of service in any way...  Let's plan a follow up visit in 84mo, sooner if needed for problems.Marland KitchenMarland Kitchen

## 2014-04-07 ENCOUNTER — Other Ambulatory Visit (INDEPENDENT_AMBULATORY_CARE_PROVIDER_SITE_OTHER): Payer: No Typology Code available for payment source

## 2014-04-07 DIAGNOSIS — E039 Hypothyroidism, unspecified: Secondary | ICD-10-CM

## 2014-04-07 LAB — CYCLIC CITRUL PEPTIDE ANTIBODY, IGG: Cyclic Citrullin Peptide Ab: <2 U/mL (ref 0.0–5.0)

## 2014-04-07 LAB — TSH: TSH: 3.24 u[IU]/mL (ref 0.35–4.50)

## 2014-04-08 ENCOUNTER — Other Ambulatory Visit: Payer: Self-pay | Admitting: Pulmonary Disease

## 2014-04-08 MED ORDER — LEVOTHYROXINE SODIUM 75 MCG PO TABS: ORAL_TABLET | ORAL | Status: DC

## 2014-04-08 NOTE — Progress Notes (Signed)
Quick Note:  Called and spoke to pt. Informed pt of the results and recs per SN. Pt verbalized understanding and denied any further questions or concerns at this time.   ______

## 2014-04-08 NOTE — Progress Notes (Signed)
Quick Note:  Called and spoke to pt. Informed pt of the results and recs per SN. Pt verbalized understanding and denied any further questions or concerns at this time. ______

## 2014-04-27 ENCOUNTER — Ambulatory Visit (INDEPENDENT_AMBULATORY_CARE_PROVIDER_SITE_OTHER): Payer: No Typology Code available for payment source | Admitting: Internal Medicine

## 2014-04-27 ENCOUNTER — Encounter: Payer: Self-pay | Admitting: Internal Medicine

## 2014-04-27 VITALS — BP 112/70 | HR 74 | Ht 70.5 in | Wt 143.0 lb

## 2014-04-27 DIAGNOSIS — G909 Disorder of the autonomic nervous system, unspecified: Secondary | ICD-10-CM

## 2014-04-27 DIAGNOSIS — I495 Sick sinus syndrome: Secondary | ICD-10-CM | POA: Diagnosis not present

## 2014-04-27 DIAGNOSIS — Z45018 Encounter for adjustment and management of other part of cardiac pacemaker: Secondary | ICD-10-CM | POA: Diagnosis not present

## 2014-04-27 DIAGNOSIS — G901 Familial dysautonomia [Riley-Day]: Secondary | ICD-10-CM

## 2014-04-27 DIAGNOSIS — Z950 Presence of cardiac pacemaker: Secondary | ICD-10-CM | POA: Diagnosis not present

## 2014-04-27 LAB — MDC_IDC_ENUM_SESS_TYPE_INCLINIC
Battery Impedance: 255 Ohm
Brady Statistic AP VS Percent: 74 %
Brady Statistic AS VP Percent: 0 %
Brady Statistic AS VS Percent: 26 %
Date Time Interrogation Session: 20160404161011
Lead Channel Impedance Value: 456 Ohm
Lead Channel Impedance Value: 610 Ohm
Lead Channel Pacing Threshold Amplitude: 0.75 V
Lead Channel Pacing Threshold Amplitude: 0.75 V
Lead Channel Pacing Threshold Pulse Width: 0.4 ms
Lead Channel Pacing Threshold Pulse Width: 0.4 ms
Lead Channel Sensing Intrinsic Amplitude: 2.8 mV
Lead Channel Sensing Intrinsic Amplitude: 8 mV
Lead Channel Setting Pacing Amplitude: 2.5 V
Lead Channel Setting Sensing Sensitivity: 2.8 mV
MDC IDC MSMT BATTERY REMAINING LONGEVITY: 115 mo
MDC IDC MSMT BATTERY VOLTAGE: 2.79 V
MDC IDC SET LEADCHNL RA PACING AMPLITUDE: 2 V
MDC IDC SET LEADCHNL RV PACING PULSEWIDTH: 0.4 ms
MDC IDC STAT BRADY AP VP PERCENT: 0 %

## 2014-04-27 NOTE — Patient Instructions (Addendum)

## 2014-04-27 NOTE — Progress Notes (Signed)
Patient Care Team: Noralee Space, MD as PCP - General (Pulmonary Disease)   HPI  Amy Lane is a 64 y.o. female is seen in followup for sinus bradycardia status post pacemaker, status post generator replacement and lead repair March 2011.  She also has a history of Ehlers-Danlos syndrome in the context of a marfanoid habitus, mitral valve prolapse status post mitral valve repair at the [facility] clinic with prior TIA on chronic Coumadin as well as syncope due to diagnosis of long QT syndrome. She has had significant dysautonomic symptoms consistent with POTS   The patient denies SOB, chest pain, edema or palpitations,      Past Medical History  Diagnosis Date  . Herpes zoster   . SVT (supraventricular tachycardia)   . PSVT (paroxysmal supraventricular tachycardia)   . PAT (paroxysmal atrial tachycardia)   . History of tachycardia-bradycardia syndrome   . Atrial fibrillation   . Dysautonomia   . History of syncope   . Cardiac pacemaker     DDD  MDT  . Long term (current) use of anticoagulants   . Allergic rhinitis   . Mitral valve prolapse   . Cerebrovascular disease   . Hypercholesteremia   . Hypothyroidism   . Hemorrhoids   . Ehler's-Danlos syndrome   . Fibromyalgia   . History of transient ischemic attack   . Depression   . Carcinomas, basal cell   . Skin desquamation     inflammative vaginitis  . Abnormal gait   . Hypercholesterolemia   . Headache(784.0)     Migraine    Past Surgical History  Procedure Laterality Date  . Complex mitral valve repair      at the [facility]  . Left carotid endarterectomy  11/2003    by Dr. Delton See  . Pacemaker insertion      medtronic kappa 901  . Removed chalazion from left eye lid  09/2011    Dr. Satira Sark    Current Outpatient Prescriptions  Medication Sig Dispense Refill  . acebutolol (SECTRAL) 200 MG capsule Take 1 capsule (200 mg total) by mouth at bedtime. 90 capsule 3  . citalopram (CELEXA) 20 MG  tablet START WITH 1 DAILY ---MAY INCREASE TO 2 DAILY IF NECESSARY 60 tablet 6  . ELIQUIS 5 MG TABS tablet TAKE 1 TABLET (5 MG TOTAL) BY MOUTH 2 (TWO) TIMES DAILY. 60 tablet 1  . levothyroxine (SYNTHROID, LEVOTHROID) 75 MCG tablet TAKE 1 TABLET BY MOUTH EVERY DAY 90 tablet 1  . LORazepam (ATIVAN) 1 MG tablet TAKE 1 TABLET AT BEDTIME AS NEEDED FOR SLEEP (Patient taking differently: TAKE 1 TABLET BY MOUTH AT BEDTIME AS NEEDED FOR SLEEP) 30 tablet 5  . polyethylene glycol (MIRALAX / GLYCOLAX) packet Take 17 g by mouth daily as needed (constipation).     . Probiotic Product (ALIGN) 4 MG CAPS Take 4 mg by mouth every morning.    . traMADol (ULTRAM) 50 MG tablet Take 1 tablet (50 mg total) by mouth 3 (three) times daily as needed for moderate pain. 50 tablet 0  . tretinoin (RETIN-A) 0.1 % cream Apply 1 application topically daily as needed (ACNE).   3  . ZETIA 10 MG tablet TAKE 1 TABLET (10 MG TOTAL) BY MOUTH DAILY. 90 tablet 1   No current facility-administered medications for this visit.    Allergies  Allergen Reactions  . Other Other (See Comments)    All QTc prolongation drugs  . Statins Other (See Comments)  myalgias    Review of Systems negative except from HPI and PMH  Physical Exam BP 112/70 mmHg  Pulse 74  Ht 5' 10.5" (1.791 m)  Wt 143 lb (64.864 kg)  BMI 20.22 kg/m2 Well developed and well nourished in no acute distress HENT normal E scleral and icterus clear Neck Supple JVP flat; carotids brisk and full Clear to ausculation vRegular rate and rhythm, no murmurs gallops or rub Soft with active bowel sounds No clubbing cyanosis  Edema Alert and oriented, grossly normal motor and sensory function Skin Warm and Dry    Assessment and  Plan  Recurrent TIA  Mitral valve repair  Possible long QT  POTS/dysautonomia  Marfanoid habitus  Atrial fibrillation-paroxysmal  Pacemaker The patient's device was interrogated.  The information was reviewed. No changes were  made in the programming.      the patient has had a subsequent TIA while on anticoagulation;  she is now on apixaban.  No significant lightheadedness.  Her husband has been intercurrently diagnosed with an aggressive form of prostate cancer. It undergone surgery and radiation therapy. He awaits next step. He and his sons are refinishing cars.  We spent more than 50% of our >25 min visit in face to face counseling regarding the above

## 2014-05-04 ENCOUNTER — Other Ambulatory Visit: Payer: Self-pay | Admitting: Pulmonary Disease

## 2014-05-04 ENCOUNTER — Telehealth: Payer: Self-pay | Admitting: Pulmonary Disease

## 2014-05-04 NOTE — Telephone Encounter (Signed)
Last OV 04/06/14 Pending OV 10/07/14 Last refill 10/06/13 #30 with 5 additional refills  SN - please advise on refill. Thanks.

## 2014-05-05 ENCOUNTER — Other Ambulatory Visit: Payer: Self-pay | Admitting: Pulmonary Disease

## 2014-05-05 MED ORDER — LORAZEPAM 1 MG PO TABS: ORAL_TABLET | ORAL | Status: DC

## 2014-05-05 NOTE — Telephone Encounter (Signed)
Per SN, ok to refill lorazepam 1mg  # 90 tablets with no refills. Instructions are 1 po tid prn for anxiety. Please call medication into pt pharmacy. Thanks   Pt aware of this, verified pharmacy.  Called this in.  Nothing further needed.

## 2014-05-05 NOTE — Telephone Encounter (Signed)
Pt cb, [id number]

## 2014-05-05 NOTE — Telephone Encounter (Signed)
Dr. Lenna Gilford please advise if you are ok with this refill.  Thanks!

## 2014-05-07 NOTE — Therapy (Signed)
[facility] [address] [place], [place], [address] Phone: [phone]   Fax:  [phone]  Patient Details  Name: Amy Lane MRN: [id number] Date of Birth: 07-May-1950 Referring Provider:  No ref. provider found  Encounter Date: 05/07/2014  PHYSICAL THERAPY DISCHARGE SUMMARY  Visits from Start of Care: 2  Current functional level related to goals / functional outcomes: Unknown as pt hasn't been seen since her second visit. Frequent cancellations and no-shows due to family medical concerns. Unable to assess goals.   Remaining deficits: Same as evaluation--neck pain   Education / Equipment: Home exercise program  Plan: Patient agrees to discharge.  Patient goals were not met. Patient is being discharged due to not returning since the last visit.  ?????     Delrae Sawyers, PT,DPT,NCS 05/07/2014 2:26 PM Phone [phone] FAX [phone]         [facility] [address] [place], [place], [address] Phone: [phone]   Fax:  [phone]

## 2014-05-12 ENCOUNTER — Encounter: Payer: Self-pay | Admitting: Internal Medicine

## 2014-05-18 ENCOUNTER — Other Ambulatory Visit: Payer: Self-pay | Admitting: Cardiovascular Disease

## 2014-05-24 ENCOUNTER — Other Ambulatory Visit: Payer: Self-pay | Admitting: Cardiovascular Disease

## 2014-05-28 ENCOUNTER — Telehealth: Payer: Self-pay | Admitting: Pulmonary Disease

## 2014-05-28 ENCOUNTER — Other Ambulatory Visit: Payer: Self-pay | Admitting: Cardiovascular Disease

## 2014-05-28 NOTE — Telephone Encounter (Signed)
Per SN, ok to refill pt's rx for ativan. #90 tablets with 5 refills. Instructions to take 1 tab by mouth TID PRN for anxiety. Pt called and informed that refill has been approved. RX phoned into pt's pharmacy by Probation officer. Nothing else is needed at this time.

## 2014-05-28 NOTE — Telephone Encounter (Signed)
Patient requesting refill of Ativan.  Last Refill: 05/05/14; last OV: 04/06/14; next OV: 10/07/14 Current Outpatient Prescriptions on File Prior to Visit  Medication Sig Dispense Refill  . acebutolol (SECTRAL) 200 MG capsule TAKE 1 CAPSULE (200 MG TOTAL) BY MOUTH AT BEDTIME. 90 capsule 3  . citalopram (CELEXA) 20 MG tablet Take 20 mg by mouth daily. MAY INCREASE TO 2 TABLETS DAILY IF NECESSARY    . ELIQUIS 5 MG TABS tablet TAKE 1 TABLET (5 MG TOTAL) BY MOUTH 2 (TWO) TIMES DAILY. 60 tablet 1  . levothyroxine (SYNTHROID, LEVOTHROID) 75 MCG tablet TAKE 1 TABLET BY MOUTH EVERY DAY 90 tablet 1  . LORazepam (ATIVAN) 1 MG tablet 1 tablet tid prn anxiety. 90 tablet 0  . polyethylene glycol (MIRALAX / GLYCOLAX) packet Take 17 g by mouth daily as needed (constipation).     . Probiotic Product (ALIGN) 4 MG CAPS Take 4 mg by mouth every morning.    . traMADol (ULTRAM) 50 MG tablet Take 1 tablet (50 mg total) by mouth 3 (three) times daily as needed for moderate pain. 50 tablet 0  . tretinoin (RETIN-A) 0.1 % cream Apply 1 application topically daily as needed (ACNE).   3  . ZETIA 10 MG tablet TAKE 1 TABLET (10 MG TOTAL) BY MOUTH DAILY. 90 tablet 1   No current facility-administered medications on file prior to visit.   Allergies  Allergen Reactions  . Other Other (See Comments)    All QTc prolongation drugs  . Statins Other (See Comments)    myalgias

## 2014-06-05 ENCOUNTER — Other Ambulatory Visit: Payer: Self-pay | Admitting: Pulmonary Disease

## 2014-06-05 NOTE — Telephone Encounter (Signed)
Dr. Lenna Gilford do you approve this fill?

## 2014-07-01 ENCOUNTER — Encounter (HOSPITAL_COMMUNITY): Payer: Self-pay | Admitting: Emergency Medicine

## 2014-07-01 ENCOUNTER — Emergency Department (INDEPENDENT_AMBULATORY_CARE_PROVIDER_SITE_OTHER)
Admission: EM | Admit: 2014-07-01 | Discharge: 2014-07-01 | Disposition: A | Discharge status: Home / Self Care | Payer: No Typology Code available for payment source | Source: Home / Self Care | Attending: Family Medicine | Admitting: Family Medicine

## 2014-07-01 DIAGNOSIS — K5900 Constipation, unspecified: Secondary | ICD-10-CM

## 2014-07-01 DIAGNOSIS — S8002XA Contusion of left knee, initial encounter: Secondary | ICD-10-CM | POA: Diagnosis not present

## 2014-07-01 MED ORDER — PEG 3350-KCL-NA BICARB-NACL 420 G PO SOLR: 4000.0000 mL | Freq: Once | ORAL | Status: DC

## 2014-07-01 NOTE — ED Notes (Signed)
Pt reports she hit her left knee against wooden table today around 1600 Sx include swelling and pain; taking Eliquis and is concerned Mild pain associated  Steady gait; no signs of acute distress.

## 2014-07-01 NOTE — Discharge Instructions (Signed)
Thank you for coming in today. Take the GoLytely.  Follow up with Gastroenterology.  Keep using ice and compression on your knee.  Return as needed.    Constipation Constipation is when a person has fewer than three bowel movements a week, has difficulty having a bowel movement, or has stools that are dry, hard, or larger than normal. As people grow older, constipation is more common. If you try to fix constipation with medicines that make you have a bowel movement (laxatives), the problem may get worse. Long-term laxative use may cause the muscles of the colon to become weak. A low-fiber diet, not taking in enough fluids, and taking certain medicines may make constipation worse.  CAUSES   Certain medicines, such as antidepressants, pain medicine, iron supplements, antacids, and water pills.   Certain diseases, such as diabetes, irritable bowel syndrome (IBS), thyroid disease, or depression.   Not drinking enough water.   Not eating enough fiber-rich foods.   Stress or travel.   Lack of physical activity or exercise.   Ignoring the urge to have a bowel movement.   Using laxatives too much.  SIGNS AND SYMPTOMS   Having fewer than three bowel movements a week.   Straining to have a bowel movement.   Having stools that are hard, dry, or larger than normal.   Feeling full or bloated.   Pain in the lower abdomen.   Not feeling relief after having a bowel movement.  DIAGNOSIS  Your health care provider will take a medical history and perform a physical exam. Further testing may be done for severe constipation. Some tests may include:  A barium enema X-ray to examine your rectum, colon, and, sometimes, your small intestine.   A sigmoidoscopy to examine your lower colon.   A colonoscopy to examine your entire colon. TREATMENT  Treatment will depend on the severity of your constipation and what is causing it. Some dietary treatments include drinking more fluids  and eating more fiber-rich foods. Lifestyle treatments may include regular exercise. If these diet and lifestyle recommendations do not help, your health care provider may recommend taking over-the-counter laxative medicines to help you have bowel movements. Prescription medicines may be prescribed if over-the-counter medicines do not work.  HOME CARE INSTRUCTIONS   Eat foods that have a lot of fiber, such as fruits, vegetables, whole grains, and beans.  Limit foods high in fat and processed sugars, such as french fries, hamburgers, cookies, candies, and soda.   A fiber supplement may be added to your diet if you cannot get enough fiber from foods.   Drink enough fluids to keep your urine clear or pale yellow.   Exercise regularly or as directed by your health care provider.   Go to the restroom when you have the urge to go. Do not hold it.   Only take over-the-counter or prescription medicines as directed by your health care provider. Do not take other medicines for constipation without talking to your health care provider first.  [place] IF:   You have bright red blood in your stool.   Your constipation lasts for more than 4 days or gets worse.   You have abdominal or rectal pain.   You have thin, pencil-like stools.   You have unexplained weight loss. MAKE SURE YOU:   Understand these instructions.  Will watch your condition.  Will get help right away if you are not doing well or get worse. Document Released: 10/08/2003 Document Revised: 01/14/2013 Document  Reviewed: 10/21/2012 [employer] Patient Information 2015 [place], [place]. This information is not intended to replace advice given to you by your health care provider. Make sure you discuss any questions you have with your health care provider.  Hematoma A hematoma is a collection of blood under the skin, in an organ, in a body space, in a joint space, or in other tissue. The blood can clot to  form a lump that you can see and feel. The lump is often firm and may sometimes become sore and tender. Most hematomas get better in a few days to weeks. However, some hematomas may be serious and require medical care. Hematomas can range in size from very small to very large. CAUSES  A hematoma can be caused by a blunt or penetrating injury. It can also be caused by spontaneous leakage from a blood vessel under the skin. Spontaneous leakage from a blood vessel is more likely to occur in older people, especially those taking blood thinners. Sometimes, a hematoma can develop after certain medical procedures. SIGNS AND SYMPTOMS   A firm lump on the body.  Possible pain and tenderness in the area.  Bruising.Blue, dark blue, purple-red, or yellowish skin may appear at the site of the hematoma if the hematoma is close to the surface of the skin. For hematomas in deeper tissues or body spaces, the signs and symptoms may be subtle. For example, an intra-abdominal hematoma may cause abdominal pain, weakness, fainting, and shortness of breath. An intracranial hematoma may cause a headache or symptoms such as weakness, trouble speaking, or a change in consciousness. DIAGNOSIS  A hematoma can usually be diagnosed based on your medical history and a physical exam. Imaging tests may be needed if your health care provider suspects a hematoma in deeper tissues or body spaces, such as the abdomen, head, or chest. These tests may include ultrasonography or a CT scan.  TREATMENT  Hematomas usually go away on their own over time. Rarely does the blood need to be drained out of the body. Large hematomas or those that may affect vital organs will sometimes need surgical drainage or monitoring. HOME CARE INSTRUCTIONS   Apply ice to the injured area:   Put ice in a plastic bag.   Place a towel between your skin and the bag.   Leave the ice on for 20 minutes, 2-3 times a day for the first 1 to 2 days.   After  the first 2 days, switch to using warm compresses on the hematoma.   Elevate the injured area to help decrease pain and swelling. Wrapping the area with an elastic bandage may also be helpful. Compression helps to reduce swelling and promotes shrinking of the hematoma. Make sure the bandage is not wrapped too tight.   If your hematoma is on a lower extremity and is painful, crutches may be helpful for a couple days.   Only take over-the-counter or prescription medicines as directed by your health care provider. SEEK IMMEDIATE MEDICAL CARE IF:   You have increasing pain, or your pain is not controlled with medicine.   You have a fever.   You have worsening swelling or discoloration.   Your skin over the hematoma breaks or starts bleeding.   Your hematoma is in your chest or abdomen and you have weakness, shortness of breath, or a change in consciousness.  Your hematoma is on your scalp (caused by a fall or injury) and you have a worsening headache or a change in alertness  or consciousness. MAKE SURE YOU:   Understand these instructions.  Will watch your condition.  Will get help right away if you are not doing well or get worse. Document Released: 08/24/2003 Document Revised: 09/11/2012 Document Reviewed: 06/19/2012 [facility] Patient Information 2015 [place], [place]. This information is not intended to replace advice given to you by your health care provider. Make sure you discuss any questions you have with your health care provider.

## 2014-07-01 NOTE — ED Provider Notes (Signed)
Amy Lane is a 64 y.o. female who presents to Urgent Care today for left knee contusion and constipation. Patient fell today landing on her left knee. She developed immediately a very large hematoma on her left knee. She applied ice and compression which has helped. She sings that it's produced. She notes her pain is 1 out of 10 without any pain with ambulation. She feels well. She did not hit her head. She takes Eliquis and knew to come to the doctor's office if she falls and has bleeding.   Additionally she notes significant constipation. She has been unable to have a bowel movement for the last 13 days. She's had pencil thin stools off-and-on for the past few weeks. She notes abdominal bloating but denies any pain or vomiting. She feels well otherwise. She's tried MiraLAX biscodyl and enemas. Her last colonoscopy was 4 years ago.   Past Medical History  Diagnosis Date  . Herpes zoster   . SVT (supraventricular tachycardia)   . PSVT (paroxysmal supraventricular tachycardia)   . PAT (paroxysmal atrial tachycardia)   . History of tachycardia-bradycardia syndrome   . Atrial fibrillation   . Dysautonomia   . History of syncope   . Cardiac pacemaker     DDD  MDT  . Long term (current) use of anticoagulants   . Allergic rhinitis   . Mitral valve prolapse   . Cerebrovascular disease   . Hypercholesteremia   . Hypothyroidism   . Hemorrhoids   . Ehler's-Danlos syndrome   . Fibromyalgia   . History of transient ischemic attack   . Depression   . Carcinomas, basal cell   . Skin desquamation     inflammative vaginitis  . Abnormal gait   . Hypercholesterolemia   . Headache(784.0)     Migraine   Past Surgical History  Procedure Laterality Date  . Complex mitral valve repair      at the [facility]  . Left carotid endarterectomy  11/2003    by Dr. Delton See  . Pacemaker insertion      medtronic kappa 901  . Removed chalazion from left eye lid  09/2011    Dr. Satira Sark    History  Substance Use Topics  . Smoking status: Former Smoker    Types: Cigarettes  . Smokeless tobacco: Never Used     Comment: smoked in her early 20's--smoked 4 cigs per year   . Alcohol Use: Yes     Comment: Consumes alcohol on occasion   ROS as above Medications: No current facility-administered medications for this encounter.   Current Outpatient Prescriptions  Medication Sig Dispense Refill  . citalopram (CELEXA) 20 MG tablet START WITH 1 TABLET BY MOUTH DAILY, MAY INCREASE TO 2 DAILY IF NECESSARY 60 tablet 6  . ELIQUIS 5 MG TABS tablet TAKE 1 TABLET (5 MG TOTAL) BY MOUTH 2 (TWO) TIMES DAILY. 60 tablet 1  . levothyroxine (SYNTHROID, LEVOTHROID) 75 MCG tablet TAKE 1 TABLET BY MOUTH EVERY DAY 90 tablet 1  . LORazepam (ATIVAN) 1 MG tablet TAKE 1 TABLET BY MOUTH 3 TIMES A DAY AS NEEDED 90 tablet 5  . polyethylene glycol (MIRALAX / GLYCOLAX) packet Take 17 g by mouth daily as needed (constipation).     Marland Kitchen ZETIA 10 MG tablet TAKE 1 TABLET (10 MG TOTAL) BY MOUTH DAILY. 90 tablet 1  . acebutolol (SECTRAL) 200 MG capsule TAKE 1 CAPSULE (200 MG TOTAL) BY MOUTH AT BEDTIME. 90 capsule 3  . polyethylene glycol-electrolytes (NULYTELY/GOLYTELY) 420 G  solution Take 4,000 mLs by mouth once. 4000 mL 0  . Probiotic Product (ALIGN) 4 MG CAPS Take 4 mg by mouth every morning.    . traMADol (ULTRAM) 50 MG tablet Take 1 tablet (50 mg total) by mouth 3 (three) times daily as needed for moderate pain. 50 tablet 0  . tretinoin (RETIN-A) 0.1 % cream Apply 1 application topically daily as needed (ACNE).   3   Allergies  Allergen Reactions  . Other Other (See Comments)    All QTc prolongation drugs  . Statins Other (See Comments)    myalgias     Exam:  BP 127/72 mmHg  Pulse 60  Temp(Src) 98.3 F (36.8 C) (Oral)  Resp 16  SpO2 97% Gen: Well NAD HEENT: EOMI,  MMM Lungs: Normal work of breathing. CTABL Heart: RRR no MRG Abd: NABS, Soft. Nondistended, Nontender no masses palpated Exts:  Brisk capillary refill, warm and well perfused.  Left knee: Moderate hematoma left lateral proximal tibia. Nontender normal knee motion stable ligamentous exam pain-free gait Rectal exam: Normal anus. Normal rectal tone. No firm stool in the vault. No masses palpated.  No results found for this or any previous visit (from the past 24 hour(s)). No results found.  Assessment and Plan: 64 y.o. female with  1) knee hematoma due to fall complicated by anticoagulation. Plan for compression and ice. Return as needed.  2) constipation: Worsening constipation with history of small diameter stools is concerning for colon cancer. Plan to use a GoLYTELY prep to help relieve constipation and I have referred patient to gastroenterology for further evaluation.  Discussed warning signs or symptoms. Please see discharge instructions. Patient expresses understanding.     Gregor Hams, MD 07/01/14 2021

## 2014-07-06 ENCOUNTER — Encounter: Payer: Self-pay | Admitting: Gastroenterology

## 2014-07-17 ENCOUNTER — Other Ambulatory Visit: Payer: Self-pay | Admitting: Pulmonary Disease

## 2014-07-21 ENCOUNTER — Other Ambulatory Visit: Payer: Self-pay | Admitting: Pulmonary Disease

## 2014-07-28 ENCOUNTER — Telehealth: Payer: Self-pay | Admitting: Cardiology

## 2014-07-28 ENCOUNTER — Ambulatory Visit (INDEPENDENT_AMBULATORY_CARE_PROVIDER_SITE_OTHER): Payer: No Typology Code available for payment source | Admitting: *Deleted

## 2014-07-28 ENCOUNTER — Other Ambulatory Visit: Payer: Self-pay | Admitting: Cardiovascular Disease

## 2014-07-28 DIAGNOSIS — I495 Sick sinus syndrome: Secondary | ICD-10-CM

## 2014-07-28 NOTE — Progress Notes (Signed)
Remote pacemaker transmission.   

## 2014-07-28 NOTE — Telephone Encounter (Signed)
Spoke with pt and reminded pt of remote transmission that is due today. Pt verbalized understanding.   

## 2014-08-06 LAB — CUP PACEART REMOTE DEVICE CHECK
Battery Impedance: 255 Ohm
Battery Remaining Longevity: 114 mo
Battery Voltage: 2.79 V
Brady Statistic AP VP Percent: 0 %
Brady Statistic AP VS Percent: 73 %
Brady Statistic AS VS Percent: 26 %
Lead Channel Impedance Value: 584 Ohm
Lead Channel Pacing Threshold Amplitude: 0.75 V
Lead Channel Pacing Threshold Pulse Width: 0.4 ms
Lead Channel Pacing Threshold Pulse Width: 0.4 ms
Lead Channel Sensing Intrinsic Amplitude: 1.4 mV
Lead Channel Setting Pacing Amplitude: 2.5 V
Lead Channel Setting Pacing Pulse Width: 0.4 ms
Lead Channel Setting Sensing Sensitivity: 2.8 mV
MDC IDC MSMT LEADCHNL RA IMPEDANCE VALUE: 402 Ohm
MDC IDC MSMT LEADCHNL RV PACING THRESHOLD AMPLITUDE: 1 V
MDC IDC MSMT LEADCHNL RV SENSING INTR AMPL: 5.6 mV
MDC IDC SESS DTM: 20160705172637
MDC IDC SET LEADCHNL RA PACING AMPLITUDE: 2 V
MDC IDC STAT BRADY AS VP PERCENT: 0 %

## 2014-08-19 ENCOUNTER — Other Ambulatory Visit: Payer: Self-pay | Admitting: Cardiovascular Disease

## 2014-09-02 ENCOUNTER — Encounter: Payer: Self-pay | Admitting: Gastroenterology

## 2014-09-02 ENCOUNTER — Telehealth: Payer: Self-pay | Admitting: Emergency Medicine

## 2014-09-02 ENCOUNTER — Encounter: Payer: Self-pay | Admitting: Cardiology

## 2014-09-02 ENCOUNTER — Ambulatory Visit (INDEPENDENT_AMBULATORY_CARE_PROVIDER_SITE_OTHER): Payer: No Typology Code available for payment source | Admitting: Gastroenterology

## 2014-09-02 VITALS — BP 110/70 | HR 68 | Ht 70.25 in | Wt 139.1 lb

## 2014-09-02 DIAGNOSIS — R194 Change in bowel habit: Secondary | ICD-10-CM

## 2014-09-02 DIAGNOSIS — K5900 Constipation, unspecified: Secondary | ICD-10-CM | POA: Diagnosis not present

## 2014-09-02 MED ORDER — PEG-KCL-NACL-NASULF-NA ASC-C 100 G PO SOLR: 1.0000 | Freq: Once | ORAL | Status: DC

## 2014-09-02 MED ORDER — LINACLOTIDE 145 MCG PO CAPS: 145.0000 ug | ORAL_CAPSULE | Freq: Every day | ORAL | Status: DC

## 2014-09-02 NOTE — Telephone Encounter (Signed)
Amy Lane DOB: [birth year] MRN: [id number]   Dear Dr Caryl Comes,    We have scheduled the above patient for an endoscopic procedure. Our records show that she is on anticoagulation therapy.   Please advise as to how long the patient may come off her therapy of Eliquis prior to the procedure.  Please fax back/ or route the completed form at [phone] to [facility].   Sincerely,    Christian Mate CMA(AAMA)

## 2014-09-02 NOTE — Progress Notes (Signed)
Review of pertinent gastrointestinal problems: 1. Routine risk for colon cancer;  Colonoscopy Dr. Ardis Hughs 02/2007 for 'fh of colon polyps' found hemorrhoids, no polyps.  Recommended repeat colon cancer screening with colonoscopy in 10 years.  HPI: This is a  very pleasant 64 year old woman    who was referred to me by Gregor Hams, MD  to evaluate  chronic constipation .    I believe I last saw her about 7 years ago the time of colonoscopy, see above  Chief complaint is chronic constipation  Went to urgent care, was really constipated.  She's always constipated, always with gas for the past 1-2 years.  Scibbolous stools,   Rarely normal BM, will take colace;  Has tried miralax, stool softners, colon clense (OTC), fiber for months.  If she takes nothing, she will have a BM about every 10 days (only scibbolous).  She takes Eliquis for atrial fibrillation.      Labs 03/2014: cbc, cmet, tsh, esr were all normal   Review of systems: Pertinent positive and negative review of systems were noted in the above HPI section. Complete review of systems was performed and was otherwise normal.   Past Medical History  Diagnosis Date  . Herpes zoster   . SVT (supraventricular tachycardia)   . PSVT (paroxysmal supraventricular tachycardia)   . PAT (paroxysmal atrial tachycardia)   . History of tachycardia-bradycardia syndrome   . Atrial fibrillation   . Dysautonomia   . History of syncope   . Cardiac pacemaker     DDD  MDT  . Long term (current) use of anticoagulants   . Allergic rhinitis   . Mitral valve prolapse   . Cerebrovascular disease   . Hypercholesteremia   . Hypothyroidism   . Hemorrhoids   . Ehler's-Danlos syndrome   . Fibromyalgia   . History of transient ischemic attack   . Depression   . Carcinomas, basal cell   . Skin desquamation     inflammative vaginitis  . Abnormal gait   . Hypercholesterolemia   . Headache(784.0)     Migraine    Past Surgical History   Procedure Laterality Date  . Complex mitral valve repair      at the [facility]  . Left carotid endarterectomy  11/2003    by Dr. Delton See  . Pacemaker insertion      medtronic kappa 901  . Removed chalazion from left eye lid  09/2011    Dr. Satira Sark    Current Outpatient Prescriptions  Medication Sig Dispense Refill  . acebutolol (SECTRAL) 200 MG capsule TAKE 1 CAPSULE (200 MG TOTAL) BY MOUTH AT BEDTIME. 90 capsule 3  . BISACODYL PO Take 1 tablet by mouth. Every 3 days    . citalopram (CELEXA) 20 MG tablet START WITH 1 TABLET BY MOUTH DAILY, MAY INCREASE TO 2 DAILY IF NECESSARY 60 tablet 6  . ELIQUIS 5 MG TABS tablet TAKE 1 TABLET (5 MG TOTAL) BY MOUTH 2 (TWO) TIMES DAILY. 60 tablet 11  . levothyroxine (SYNTHROID, LEVOTHROID) 75 MCG tablet TAKE 1 TABLET BY MOUTH EVERY DAY 90 tablet 1  . LORazepam (ATIVAN) 1 MG tablet TAKE 1 TABLET BY MOUTH 3 TIMES A DAY AS NEEDED 90 tablet 0  . Probiotic Product (ALIGN) 4 MG CAPS Take 4 mg by mouth every morning.    . traMADol (ULTRAM) 50 MG tablet Take 1 tablet (50 mg total) by mouth 3 (three) times daily as needed for moderate pain. 50 tablet 0  .  ZETIA 10 MG tablet TAKE 1 TABLET (10 MG TOTAL) BY MOUTH DAILY. 90 tablet 1   No current facility-administered medications for this visit.    Allergies as of 09/02/2014 - Review Complete 09/02/2014  Allergen Reaction Noted  . Other Other (See Comments) 11/21/2010  . Statins Other (See Comments) 01/26/2011    Family History  Problem Relation Age of Onset  . Leukemia Other     Nephew  . Lung cancer Mother   . Prostate cancer Father   . Alzheimer's disease Father   . Heart disease Father   . Mitral valve prolapse Brother   . Diabetes Sister   . Mitral valve prolapse Sister   . Heart disease      entire maternal side of family  . Cancer      entire maternal side of family  . Ehlers-Danlos syndrome Father     Social History   Social History  . Marital Status: Married    Spouse Name:  Larry  . Number of Children: 0  . Years of Education: S-College   Occupational History  . retired    Social History Main Topics  . Smoking status: Never Smoker   . Smokeless tobacco: Never Used     Comment: smoked in her early 20's--smoked 4 cigs per year   . Alcohol Use: 0.0 oz/week    0 Standard drinks or equivalent per week     Comment: 2 drinks per year  . Drug Use: No  . Sexual Activity: Not on file   Other Topics Concern  . Not on file   Social History Narrative     Physical Exam: BP 110/70 mmHg  Pulse 68  Ht 5' 10.25" (1.784 m)  Wt 139 lb 2 oz (63.107 kg)  BMI 19.83 kg/m2 Constitutional: generally well-appearing Psychiatric: alert and oriented x3 Eyes: extraocular movements intact Mouth: oral pharynx moist, no lesions Neck: supple no lymphadenopathy Cardiovascular: heart regular rate and rhythm Lungs: clear to auscultation bilaterally Abdomen: soft, nontender, nondistended, no obvious ascites, no peritoneal signs, normal bowel sounds Extremities: no lower extremity edema bilaterally Skin: no lesions on visible extremities   Assessment and plan: 64 y.o. female with  chronic constipation  I would like to proceed with colonoscopy at her soonest convenience to rule out structural causes, neoplasm, significant stricturing. I think this is unlikely however. She is on a blood thinner Eliquis and we will communicate with her cardiologist about the safety of her holding that medicine for 2 days prior to colonoscopy. In the meantime she will begin Linzess 145 g pill one pill once daily as well as restart fiber supplements on a daily basis.    , MD [facility] 09/02/2014, 10:10 AM  Cc: Corey, Evan S, MD   

## 2014-09-02 NOTE — Patient Instructions (Addendum)
You will be set up for a colonoscopy for change in bowels, constipation.  Will contact your cardiologist about the safety of holding your eliquis for 2 days prior to the colonoscopy (Dr. Johnsie Cancel) Start linzess one pill once daily. Please start taking citrucel (orange flavored) powder fiber supplement.  This may cause some bloating at first but that usually goes away. Begin with a small spoonful and work your way up to a large, heaping spoonful daily over a week.

## 2014-09-04 NOTE — Telephone Encounter (Signed)
She has hx of stroke and so this should be done only if NECESSARY  otherwise apixoban should be stopped for minimal time prob 24 hrs  ( dose night before and mornign of)

## 2014-09-07 ENCOUNTER — Encounter: Payer: Self-pay | Admitting: Internal Medicine

## 2014-09-08 NOTE — Telephone Encounter (Signed)
Dr, Ardis Hughs please review

## 2014-09-09 NOTE — Telephone Encounter (Signed)
Given her history of stroke and so higher risk to hold her eliquis, I'd like to hold on the colonoscopy for now.  Please remind her to take the linzess schedule ROV in 6-7 weeks to check on her response.  If she does not have a good response, we'll reconsider the colonoscopy.

## 2014-09-09 NOTE — Telephone Encounter (Signed)
Called patient and LMOM to call back

## 2014-09-09 NOTE — Telephone Encounter (Signed)
Patient informed. Colon cancelled and OV scheduled for 10/18 with Dr. Ardis Hughs.

## 2014-09-11 ENCOUNTER — Other Ambulatory Visit: Payer: Self-pay | Admitting: Emergency Medicine

## 2014-09-11 NOTE — Telephone Encounter (Signed)
Patient called to inquire about her Linzess. She states she is unable to get it from the pharmacy and needs an authorization first. Concerned that she has not had Linzess in 8 days. I informed patient I would check with Dr. Ardis Hughs nurse to see if we received any prior auth and to give it a few days to get back to the pharmacy. Patient verbalized agreement.

## 2014-09-14 ENCOUNTER — Encounter: Payer: Self-pay | Admitting: Gastroenterology

## 2014-09-14 NOTE — Telephone Encounter (Signed)
Pt sent pt advice request today stating she has not been able to get through to the office and has still not heard back regarding her linzess. Call placed to pharmacy and call placed to [phone] for prior auth. Linzess approved through September 13 2017 Ref#[id number].

## 2014-09-15 ENCOUNTER — Telehealth: Payer: Self-pay | Admitting: Internal Medicine

## 2014-09-15 NOTE — Telephone Encounter (Signed)
New message     Pt said her heart rate went down to 49 and 52 yesterday.  She said her pacemaker is set at 6.  Should she be concerned?

## 2014-09-15 NOTE — Telephone Encounter (Signed)
Pt was taking her pulse via fitbit. Pt then checked pulse manually. Updated pt her LRL is 60bpm. I explained to pt potential PVCs will cause misinterpretation of actual pulse rate. Based on last remote on 07/28/14, no inappropriate device diagnostics were noted. Pt also not having new symptoms other than normal concern device was not working properly. Pt's underlying rhythm last OV was SB@54 . Pt thankful for explanation.

## 2014-10-07 ENCOUNTER — Ambulatory Visit: Payer: No Typology Code available for payment source | Admitting: Pulmonary Disease

## 2014-10-21 ENCOUNTER — Other Ambulatory Visit: Payer: Self-pay | Admitting: Pulmonary Disease

## 2014-10-22 NOTE — Telephone Encounter (Signed)
Pt requesting refill on Ativan through her pharmacy. Last refill: Ativan 1mg  tab #90 with 0 refills, to take TID PRN on 07/22/2014.  Last office visit: 04/06/2014. Next office visit: none.  Pt cancelled 10/07/2014 rov.  SN please advise on this refill.  Thanks!

## 2014-10-27 ENCOUNTER — Encounter: Payer: No Typology Code available for payment source | Admitting: Gastroenterology

## 2014-10-28 NOTE — Telephone Encounter (Signed)
Per SN, Ok to refill #9 with 0 refills  Refill called into pt's pharmacy  Nothing further is needed

## 2014-10-29 ENCOUNTER — Encounter: Payer: No Typology Code available for payment source | Admitting: *Deleted

## 2014-10-29 ENCOUNTER — Telehealth: Payer: Self-pay | Admitting: Cardiology

## 2014-10-29 NOTE — Telephone Encounter (Signed)
LMOVM reminding pt to send remote transmission.   

## 2014-10-30 ENCOUNTER — Encounter: Payer: Self-pay | Admitting: Cardiology

## 2014-11-02 ENCOUNTER — Ambulatory Visit (INDEPENDENT_AMBULATORY_CARE_PROVIDER_SITE_OTHER): Payer: No Typology Code available for payment source | Admitting: *Deleted

## 2014-11-02 DIAGNOSIS — I495 Sick sinus syndrome: Secondary | ICD-10-CM

## 2014-11-03 NOTE — Progress Notes (Signed)
Remote pacemaker transmission.   

## 2014-11-06 LAB — CUP PACEART REMOTE DEVICE CHECK
Battery Impedance: 255 Ohm
Battery Remaining Longevity: 115 mo
Battery Voltage: 2.8 V
Brady Statistic AP VS Percent: 74 %
Brady Statistic AS VP Percent: 0 %
Implantable Lead Implant Date: 20031031
Implantable Lead Location: 753859
Lead Channel Impedance Value: 430 Ohm
Lead Channel Impedance Value: 639 Ohm
Lead Channel Pacing Threshold Amplitude: 0.625 V
Lead Channel Pacing Threshold Amplitude: 0.75 V
Lead Channel Pacing Threshold Pulse Width: 0.4 ms
Lead Channel Pacing Threshold Pulse Width: 0.4 ms
Lead Channel Setting Pacing Amplitude: 2 V
Lead Channel Setting Pacing Amplitude: 2.5 V
Lead Channel Setting Pacing Pulse Width: 0.4 ms
Lead Channel Setting Sensing Sensitivity: 2.8 mV
MDC IDC LEAD IMPLANT DT: 19981106
MDC IDC LEAD LOCATION: 753860
MDC IDC MSMT LEADCHNL RA SENSING INTR AMPL: 1.4 mV
MDC IDC MSMT LEADCHNL RV SENSING INTR AMPL: 5.6 mV
MDC IDC SESS DTM: 20161010130909
MDC IDC STAT BRADY AP VP PERCENT: 0 %
MDC IDC STAT BRADY AS VS PERCENT: 26 %

## 2014-11-10 ENCOUNTER — Ambulatory Visit (INDEPENDENT_AMBULATORY_CARE_PROVIDER_SITE_OTHER): Payer: No Typology Code available for payment source | Admitting: Gastroenterology

## 2014-11-10 ENCOUNTER — Ambulatory Visit: Payer: No Typology Code available for payment source | Admitting: Gastroenterology

## 2014-11-10 ENCOUNTER — Encounter: Payer: Self-pay | Admitting: Gastroenterology

## 2014-11-10 VITALS — BP 100/58 | HR 64 | Ht 70.25 in | Wt 139.0 lb

## 2014-11-10 DIAGNOSIS — K5900 Constipation, unspecified: Secondary | ICD-10-CM | POA: Diagnosis not present

## 2014-11-10 NOTE — Progress Notes (Signed)
Review of pertinent gastrointestinal problems: 1. Routine risk for colon cancer; Colonoscopy Dr. Ardis Hughs 02/2007 for 'fh of colon polyps' found hemorrhoids, no polyps. Recommended repeat colon cancer screening with colonoscopy in 10 years. 2. Chronic constipation; for many years but presented 2016.  I recommended repeat colonoscopy off blood thinners however her cardiologist said she was at higher risk fiven h/o CVA.  Fiber supplement restarted and added linzess once dqily.  HPI: This is a   very pleasant 64 year old woman whom I last saw about 3 months ago  Chief complaint is constipation  For the first 3 weeks or so on linzess no real improvement.  Added Dulcolax and noted good improvement.  Now having BM 3-4 times per week.  She actually had diarrhea a couple times.  Currently taking linzess 4 times per week, on the nights   Past Medical History  Diagnosis Date  . Herpes zoster   . SVT (supraventricular tachycardia) ([place])   . PSVT (paroxysmal supraventricular tachycardia) ([place])   . PAT (paroxysmal atrial tachycardia) ([place])   . History of tachycardia-bradycardia syndrome   . Atrial fibrillation ([place])   . Dysautonomia   . History of syncope   . Cardiac pacemaker     DDD  MDT  . Long term (current) use of anticoagulants   . Allergic rhinitis   . Mitral valve prolapse   . Cerebrovascular disease   . Hypercholesteremia   . Hypothyroidism   . Hemorrhoids   . Ehler's-Danlos syndrome   . Fibromyalgia   . History of transient ischemic attack   . Depression   . Carcinomas, basal cell   . Skin desquamation     inflammative vaginitis  . Abnormal gait   . Hypercholesterolemia   . Headache(784.0)     Migraine    Past Surgical History  Procedure Laterality Date  . Complex mitral valve repair      at the [facility]  . Left carotid endarterectomy  11/2003    by Dr. Delton See  . Pacemaker insertion      medtronic kappa 901  . Removed chalazion from left eye lid  09/2011   Dr. Satira Sark    Current Outpatient Prescriptions  Medication Sig Dispense Refill  . acebutolol (SECTRAL) 200 MG capsule TAKE 1 CAPSULE (200 MG TOTAL) BY MOUTH AT BEDTIME. 90 capsule 3  . BISACODYL PO Take 1 tablet by mouth. Every 3 days    . citalopram (CELEXA) 20 MG tablet START WITH 1 TABLET BY MOUTH DAILY, MAY INCREASE TO 2 DAILY IF NECESSARY 60 tablet 6  . ELIQUIS 5 MG TABS tablet TAKE 1 TABLET (5 MG TOTAL) BY MOUTH 2 (TWO) TIMES DAILY. 60 tablet 11  . levothyroxine (SYNTHROID, LEVOTHROID) 75 MCG tablet TAKE 1 TABLET BY MOUTH EVERY DAY 90 tablet 1  . Linaclotide (LINZESS) 145 MCG CAPS capsule Take 1 capsule (145 mcg total) by mouth daily. 30 capsule 11  . LORazepam (ATIVAN) 1 MG tablet TAKE 1 TABLET BY MOUTH 3 TIMES A DAY AS NEEDED FOR ANXIETY 90 tablet 0  . Probiotic Product (ALIGN) 4 MG CAPS Take 4 mg by mouth every morning.    . traMADol (ULTRAM) 50 MG tablet Take 1 tablet (50 mg total) by mouth 3 (three) times daily as needed for moderate pain. 50 tablet 0  . ZETIA 10 MG tablet TAKE 1 TABLET (10 MG TOTAL) BY MOUTH DAILY. 90 tablet 1   No current facility-administered medications for this visit.    Allergies as of 11/10/2014 -  Review Complete 11/10/2014  Allergen Reaction Noted  . Other Other (See Comments) 11/21/2010  . Statins Other (See Comments) 01/26/2011    Family History  Problem Relation Age of Onset  . Leukemia Other     Nephew  . Lung cancer Mother   . Prostate cancer Father   . Alzheimer's disease Father   . Heart disease Father   . Mitral valve prolapse Brother   . Diabetes Sister   . Mitral valve prolapse Sister   . Heart disease      entire maternal side of family  . Cancer      entire maternal side of family  . Ehlers-Danlos syndrome Father     Social History   Social History  . Marital Status: Married    Spouse Name: Fritz Pickerel  . Number of Children: 0  . Years of Education: S-College   Occupational History  . retired    Social History Main Topics   . Smoking status: Never Smoker   . Smokeless tobacco: Never Used     Comment: smoked in her early 20's--smoked 4 cigs per year   . Alcohol Use: 0.0 oz/week    0 Standard drinks or equivalent per week     Comment: 2 drinks per year  . Drug Use: No  . Sexual Activity: Not on file   Other Topics Concern  . Not on file   Social History Narrative     Physical Exam: Wt 139 lb (63.05 kg) Constitutional: generally well-appearing Psychiatric: alert and oriented x3 Abdomen: soft, nontender, nondistended, no obvious ascites, no peritoneal signs, normal bowel sounds   Assessment and plan: 64 y.o. female with chronic constipation, much improved on linzess and Dulcolax orally  She will continue this regimen indefinitely. Since she has noticed such impressive response with these oral medications do not think we necessarily need to push ahead with a colonoscopy especially given the fact that she is at higher risk for holding her blood thinners given her history of stroke. She does call here if she has any further questions or concerns.   Owens Loffler, MD [facility] 11/10/2014, 12:07 PM

## 2014-11-10 NOTE — Patient Instructions (Signed)
Add bisacodyl 5mg  pills on days you are not taking the linzess. Call if you have trouble again.

## 2014-11-30 ENCOUNTER — Ambulatory Visit (INDEPENDENT_AMBULATORY_CARE_PROVIDER_SITE_OTHER): Payer: No Typology Code available for payment source | Admitting: Pulmonary Disease

## 2014-11-30 ENCOUNTER — Encounter: Payer: Self-pay | Admitting: Pulmonary Disease

## 2014-11-30 VITALS — BP 118/70 | HR 82 | Temp 97.7°F | Ht 70.0 in | Wt 138.0 lb

## 2014-11-30 DIAGNOSIS — E039 Hypothyroidism, unspecified: Secondary | ICD-10-CM

## 2014-11-30 DIAGNOSIS — Q796 Ehlers-Danlos syndrome, unspecified: Secondary | ICD-10-CM

## 2014-11-30 DIAGNOSIS — I495 Sick sinus syndrome: Secondary | ICD-10-CM | POA: Diagnosis not present

## 2014-11-30 DIAGNOSIS — G901 Familial dysautonomia [Riley-Day]: Secondary | ICD-10-CM

## 2014-11-30 DIAGNOSIS — Z950 Presence of cardiac pacemaker: Secondary | ICD-10-CM

## 2014-11-30 DIAGNOSIS — I059 Rheumatic mitral valve disease, unspecified: Secondary | ICD-10-CM | POA: Diagnosis not present

## 2014-11-30 DIAGNOSIS — F32A Depression, unspecified: Secondary | ICD-10-CM

## 2014-11-30 DIAGNOSIS — G909 Disorder of the autonomic nervous system, unspecified: Secondary | ICD-10-CM

## 2014-11-30 DIAGNOSIS — Z23 Encounter for immunization: Secondary | ICD-10-CM

## 2014-11-30 DIAGNOSIS — F329 Major depressive disorder, single episode, unspecified: Secondary | ICD-10-CM

## 2014-11-30 DIAGNOSIS — K5900 Constipation, unspecified: Secondary | ICD-10-CM

## 2014-11-30 MED ORDER — TRAMADOL HCL 50 MG PO TABS: 50.0000 mg | ORAL_TABLET | Freq: Three times a day (TID) | ORAL | Status: DC | PRN

## 2014-11-30 MED ORDER — EZETIMIBE 10 MG PO TABS: ORAL_TABLET | ORAL | Status: DC

## 2014-11-30 MED ORDER — LEVOTHYROXINE SODIUM 75 MCG PO TABS: ORAL_TABLET | ORAL | Status: DC

## 2014-11-30 MED ORDER — LORAZEPAM 1 MG PO TABS: ORAL_TABLET | ORAL | Status: DC

## 2014-11-30 MED ORDER — CITALOPRAM HYDROBROMIDE 20 MG PO TABS: ORAL_TABLET | ORAL | Status: DC

## 2014-11-30 NOTE — Patient Instructions (Signed)
Today we updated your med list in our EPIC system...    Continue your current medications the same...     We refilled your meds per request...  We gave you the 2016 FLU vaccine today...  Call for any questions...  Let's plan a follow up visit in 74mo, sooner if needed for problems.Marland KitchenMarland Kitchen

## 2014-11-30 NOTE — Progress Notes (Signed)
Subjective:    Patient ID: Amy Amy Lane, female    DOB: Mar 29, 1950, 64 y.o.   MRN: [id number]  HPI 64 y/o WF here for a follow up visit... she has mult med problems as noted below... she is followed regularly by Drs Amy Amy Lane for Cardiology, and Drs Amy Amy Lane for GI... ~  SEE PREV EPIC NOTES FOR OLDER DATA >>    LABS 4/14:  FLP- not at goals on diet alone but intol to all meds;  Chems- wnl;  CBC- wnl;  TSH=3.10;  VitD=19 & Rec VitD OTC supplement ~2000u daily...  ~  April 11, 2013:  53moROV & [name] by [facility] w/ HA & altered mental status- ?TIA vs complex migraine- CT Amy Lane was neg; CDopplers were ok; 2DEcho showed norm LVF, myxomatous MV s/p repair no resid MR; Coumadin was continued and ASA81 was added... She had ROV Amy Amy Lane 2/15> he felt the episode was a migraine equiv & no recurrent prob since then, she notes mild memory impairment w/ MMSE= 29/30... She notes that she's been under a lot of stress w/ father-in-law illness, husb w/ prostate ca, sold house & had to move... Now feeling much better w/ great attitude- "today is a great day"... We reviewed the following medical problems during today's office visit >>     AR> on Benedryl & Nasonex prn; breathing is good w/o cough, sput, SOB, etc...    Hx Dysautonomia (POTS) & Ehlers-Danlos syndrome> followed by Amy Amy Lane& Amy Amy Lane; on ASA81 + Coumadin but talking about switch to NOAC...    MVP, Myxomatous valve- s/p MVrepair (in [place])> on ASA81, Coumadin; she is followed by Amy Amy Lane- seen 3/15 & stable pacer & no changes made...    Hx Tachy-brady, Fib-flutter w/ ablation & pacer> on Sectral200/d, & Coumadin; she does her own Protimes at home & called to the CC for adjustments...     Cerebrovasc Dis> on ASA81, [facility], & f/u CDopplers all ok...    ?DVT & ?PE while on Coumadin (during trip to [place])> she now [place]    CHOL> on Zetia10 since 1/15 Hosp where LDL was 155; FLP on Zetia10 3/15 showed TChol 172, TG 74, HDL 59, LDL 89; she is intol to all other meds...    Hypothy> on Synthroid75; lab 3/15 shows TSH= 4.99; continue same meds, take pill first thing in morn, etc...    GI- Constip & Hems> on Align; c/o constip & asked to start Miralax, Senakot-S, added fiber; last colon 2009 by Amy Amy Lane w/ hems only & f/u planned 155yr..    GYN- kraurosis vulvae (DIV)> local GYN, [facility] vulva clinic treatment & she is up to date on screening...    Migraine HAs & ?TIA> [facility]; Hosp 1/15 w/ another episode, symptoms cleared, ASA81 added to her Coumadin...    Anxiety/ Depression> on Ativan 21m90mrn & off prev Prozac20 (it was really helping but "I'm past that now" & she felt this contrib to incr appetite & wt gain (Note- 134# & BMI=18 but she states "I'm up 3 pant sizes")... We reviewed prob list, meds, xrays and labs> see below for updates >>   CXR 01/28/13 showed norm heart size, pacemeaker & sternal wires, hyperinflation and asymmetric biapical pleural thickening L>R, NAD...  2DEcho 01/28/13 showed norm LV size & function w/ EF=55-60%, myxomatous MV  s/p MVrepair & no resid MR, mild LA dil...  LABS 4/15:  FLP- at goals on Zetia10;  Chems- wnl;  CBC- wnl;  TSH=4.99...  ~  October 06, 2013:  27moROV & PFraser Dinreports that she is doing well, CC= pain in hands & fingers (esp in the AM) w/ some swelling; she feels this is arthritis & asked to try OTC NSAID at bedtime to see if this helps... 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 We reviewed the following medical problems during today's office visit >>     AR> on Benedryl OTC & Nasonex prn; breathing is good w/o cough, sput, SOB, etc...    Hx Dysautonomia (POTS) & Ehlers-Danlos syndrome> followed by Amy Amy Lane& Amy Amy Lane; on ASA81 + Eliquis5Bid...    MVP, Myxomatous valve- s/p MVrepair (in [place])> on  ASA81, Eliquis5Bid; she is followed by Amy Amy Lane- seen 3/15 & stable pacer & no changes made...    Hx Tachy-brady, Fib-flutter w/ ablation & pacer> on Sectral200/d, & Eliquis5Bid; she does her own Protimes at home & called to the CC for adjustments...     Cerebrovasc Dis> on ASA81, [facility] w/ ulceration, & f/u CDopplers all ok...    ?DVT & ?PE while on Coumadin (during trip to [place])> she then [place]    CHOL> on Zetia10 since 1/15 Hosp where LDL was 155; FLP on Zetia10 3/15 showed TChol 172, TG 74, HDL 59, LDL 89; she is intol to all other meds...    Hypothy> on Synthroid75; lab 3/15 shows TSH= 4.99; continue same meds, take pill first thing in morn, etc...    GI- Constip & Hems> on Align; c/o constip & asked to start Miralax, Senakot-S, added fiber; last colon 2009 by Amy Amy Lane w/ hems only & f/u planned 160yr..    GYN- kraurosis vulvae (DIV)> local GYN, [facility]va [facility] treatment & she is up to date on screening; Hx enterococcus UTI...    Migraine HAs & ?TIA> she is followed by Amy Amy Lane- Amy Amy Lane; Hosp 1/15 w/ another episode, symptoms cleared, ASA81 added to her Coumadin which was later changed to Eliquis5Bid...    Anxiety/ Depression> on Ativan 33m60mrn & started on Celexa20 which she really thinks is helping; wt stable (Note- 134# & BMI=18 but she states "I'm up 3 pant sizes")... We reviewed prob list, meds, xrays and labs> see below for updates >> OK Flu vaccine today...  ~  April 06, 2014:  51mo73mo [place] been under some stress- husb (Amy Amy Lane [facility]/ [facility] he [place] receiving therapy); he is currently improved & [place] job... [name]'s CC today is some continued discomfort (pain/ swelling) in her hands & this was only min better w/ OTC NSAIDs; we  discussed trial of Tramadol50, basic lab eval & consider referral to Rheumatology for further eval... She is also concerned about an 8# wt gain to 142lbs (BMI is still only 20)... We reviewed the following medical problems during today's office visit >>     AR> on Benedryl OTC & Nasonex prn; breathing is good w/o cough, sput, SOB, etc...    Hx Dysautonomia (POTS) & Ehlers-Danlos syndrome> followed by DrNisCherly HensenKlein; BP= 110/66 w/o postural change, on Eliquis5Bid...Marland KitchenMarland Kitchen  MVP, Myxomatous valve- s/p MVrepair (in [place])> on Eliquis5Bid; she is followed by Amy Amy Lane- seen 3/15 & stable pacer & no changes made...    Hx Tachy-brady, Fib-flutter w/ ablation & pacer> on Sectral200/d, & Eliquis5Bid; she does her own Protimes at home & called to the CC for adjustments...     Cerebrovasc Dis> on Eliquis5Bid, s/p left CAE 2005 by [facility] for asymptomatic left carotid stenosis w/ ulceration, & f/u CDopplers all ok...    ?DVT & ?PE while on Coumadin (during trip to [place])> she then checked her own protimes like they do in [place]...    CHOL> on Zetia10 since 1/15 Hosp where LDL was 155; FLP on Zetia10 3/16 shows TChol 167, TG 66, HDL 61, LDL 93; she is intol to all other meds...    Hypothy> on Synthroid75; lab 3/15 shows TSH= 4.99; continue same meds, take pill first thing in morn, etc...    Underweight> BMI in the 19-20 range & we discussed nutritional supplements etc...     GI- Constip & Hems> on Align; c/o constip & asked to take Miralax, Senakot-S, added fiber; last colon 2009 by Amy Amy Lane w/ hems only & f/u planned 9yr...    GYN- kraurosis vulvae (DIV)> local GYN, [facility]va [facility] treatment & she is up to date on screening; Hx enterococcus UTI...    Migraine HAs & ?TIA> [facility]; Hosp 1/15 w/ another episode, symptoms cleared, ASA81 added to her Coumadin which was later changed to Eliquis5Bid; she had c/o some leg weakness but EMG/NCV was normal & rec to try PT...     Anxiety/ Depression> on Ativan 160mprn & started on Celexa20 which she really thinks is helping...  We reviewed prob list, meds, xrays and labs> see below for updates >>   LABS 3/16:  FLP- at goals on Zetia10;  Chems- wnl;  CBC- wnl;  TSH=3.24   Sed=15;  Anti-CCP=neg PLAN>> we decided to try warm soaks for hands and Tramadol50 prn; we will refer to Rheum for their opinion; continue other meds the same + her nutritional supplements etc...  ~  November 30, 2014:  29m11moV & PatFraser Din[place] a good interval overall- she had some constipation, went to the ER (given BE prep)/ then on to see Amy Amy Lane in GI & started on [id number] is working very well for her;  We reviewed the following medical problems during today's office visit >>     AR> on Benedryl OTC & Nasonex prn; breathing is good w/o cough, sput, SOB, etc...    Hx Dysautonomia (POTS) & Ehlers-Danlos syndrome> followed by DrNishan/Amy Amy Lane & DrHawkes (seen 4/16 w/ hand pain/weakness, polyarthralgia, OA); BP= 118/70 w/o postural change, on Eliquis5Bid...    MVP, Myxomatous valve- s/p MVrepair (in [place])> on Eliquis5Bid; she is followed by Amy Amy Lane- seen 4/16 & stable pacer & no changes made...    Hx Tachy-brady, Fib-flutter w/ ablation & pacer> on Sectral200/d, & Eliquis5Bid; she prev did her own Protimes at home & called to the CC for adjustments=> changed to Eliquis5Bid.    Cerebrovasc Dis> on Eliquis5Bid, s/p left CAE 2005 by [facility] asymptomatic left carotid stenosis w/ ulceration, & f/u CDopplers all ok...    ?DVT & ?PE while on Coumadin (during trip to [place])> she [place]    CHOL> on Zetia10 since 1/15 Hosp where LDL was 155; FLP on Zetia10 3/16 shows TChol 167, TG 66, HDL 61, LDL 93; she is intol  to all other meds...    Hypothy> on Synthroid75; lab 3/16 shows TSH= 3.24; continue same meds, take pill first thing in morn, etc...    Underweight> BMI in the 19-20 range & we discussed  nutritional supplements etc...     GI- Constip & Hems> on Align; c/o constip & now improved on Linzess per Amy Amy Lane; last colon 2009 by Amy Amy Lane w/ hems only & f/u planned 84yr...    GYN- kraurosis vulvae (DIV)> local GYN, [facility] treatment & she is up to date on screening; Hx enterococcus UTI...    Migraine HAs & ?TIA> [facility]; Hosp 1/15 w/ another episode, symptoms cleared, ASA81 added to her Coumadin which was later changed to Eliquis5Bid; she had c/o some leg weakness but EMG/NCV was normal & rec to try PT...    Anxiety/ Depression> on Ativan 14mprn & Celexa20 which she really thinks is helping...  EXAM shows Afeb, VSS, O2sat=98% on RA;  Marfanoid habitus;  HEENT- neg, mallampati2;  Chest- clear w/o w/r/r;  Heart- RR w/o m/r/g;  Abd- soft, nontender;  Ext- neg w/o c/c/e;  Neuro- intact.. IMP/PLAN>>  OK Flu shot today;  Meds refilled- continue same and ROV follow up in 5m4mo           Problem List:      ALLERGIC RHINITIS (ICD-477.9) - we discussed Rx w/ Zyrtek, Astepro Prn, etc... ~  CXR 3/11 showed left pacer, sternal wires, NAD... Marland Kitchen  CXR 6/13 showed stable post-op changes and pacer, pulm hyperinflation & scattered scarringt, calcif mediastinal nodes, no focal opacities, NAD... Marland Kitchen  CXR 1/15 showed norm heart size, post op changes & pacer, no adenop, no edema, clear x some left apical pleural thickening w/o change...  MITRAL VALVE PROLAPSE (ICD-424.0) - followed by DrNCherly Amy Lane ~  2DEcho 11/08 showed myxomatous MV, annuloplasty ring, decr post leaflet excursion, norm LVH & wall motion... ~  NuclearStressTest 11/08 showed mild apical thinning... prev study 12/04 w/o ischemia or infarct & EF=68%... ~  she sees DrNBurkina Fasoery 6 months- for f[facility]u [facility] & Ehlers-Danlos Syndrome> last seen 1/15 & note reviewed, EKG w/ irreg rhythm, borderline QT interval... ~  2DEcho 1/15> cavity size was  normal, wall thickness was normal w/ norm systolic function was normal & EF= 55% to 60%, myxomatous MV s/p repair with no residual MR, LA is mildly dilated.  Hx of BRADYCARDIA-TACHYCARDIA SYNDROME (ICD-427.81) - on SECTRAL 200m55m& COUMADIN via CC... hx AFib/Flutter w/ ablation & pacemaker placed ... she has Ehlers-Danlos synd w/ prolonged QT interval...  CARDIAC PACEMAKER IN SITU (ICD-V45.01) - f/u Amy Amy Lane w/ CTChest 4/09- neg. ~  dual chamber pacer changed 3/11 by Amy Amy Lane for end-of-life... ~  EKG shows NSR, 1st degree AVB, RBBB... ~  3/15: she had f/u Amy Amy Lane> note reviewed, pacer functioning well- no changes made but he wanted her to consider a NOAC...  DYSAUTONOMIA (ICD-742.8) - prev on Proamatine 5mg 61ms per Amy Amy Lane, but this was stopped...  CEREBROVASCULAR DISEASE (ICD-437.9) - on ASA 81mg/17mCOUMADIN (followed in the clinic)... ~  she had a Left [facility] stenosis w/ ulceration... ~  Carotid angiogram 5/08 by [facility] <30% right carotid stenosis ~  f/u CTA of Amy Lane & Neck 12/09 was neg= norm CTA Amy Lane, & no signif carotid dis noted in neck. ~  CDoppler 2/11 showed stable mild carotid dis, left CAE w/ DPA is patent, 0-39% bilat  ICA stenoses. ~  repear CDopplers 11/11 per Cherly Amy Lane showed smooth plaque in right bulb & distal left CCA- stable; 0-39% bilat ICA stenoses... ~  CT Amy Lane 6/13 is neg- No acute intracranial abnormalities... ~  CTA Brain 6/13 showed mild atherosclerotic dis in the carotid siphon regions bilat w/o stenoses, otherw norm CTA brain w/o lesions... ~  CT Angio Neck 6/13 via ER showed mild nonstenotic arteriosclerotic dis at both carotid bifurcations w/o signif stenoses... ~  CDopplers 11/13 showed mild soft smooth plaque w/ 0-39% right ICAstenosis & 40-59% left ICA stenosis, vertebrals are antegrade... ~  CDopplers 1/15 showed 1-39% internal carotid artery stenosis bilaterally & antegrade vertebral flow... ~  CDopplers 2/16  showed mixed plaque bilat, stable 1-39% bilat ICAstenoses w/ left CEA & DPA, norm subclav & patent vertebrals...  ?DVT & poss PTE while on vacation in [place] during the summer of 2012 >> pt was on Coumadin & INR was therapeutic; she had pain & swelling in left leg w/ a pos doppler reported by a [place] physician; she was also SOB & they questioned poss PTE but didn't get scan or further eval;  On return to [place] DrNishan wondered if she might have bled into the leg muscles & sent her to DrNorris for Ortho eval; he could not corroborate the theory & treated her w/ compression stockings & she gradually improved towards her baseline...  HYPERCHOLESTEROLEMIA (ICD-272.0) - on diet alone now, prev on Lip20- this was stopped 1/12 by Cherly Amy Lane & she feels much better off this med! ~  [place] 05/13/07 on Lip20 showed TChol 119, TG 50, HDL 55, LDL 54 ~  FLP 5/10 on Lip20 showed TChol 120, TG 43, HDL 58, LDL 54 ~  FLP 9/11 on Lip20 showed TChol 130, TG 54, HDL 59, LDL 60 ~  FLP 5/12 on diet alone showed TChol 218, TG 91, HDL 57, LDL 135... Continue low chol, low fat diet. ~  FLP 10/12 showed TChol 231, TG 86, HDL 68, LDL 151... I rec the Lipid CLinic, & they started NIACIN 574m/d. ~  FLP 2/13 on Niacin500 showed TChol 199, TG 55, HDL 76, LDL 112... Much improved, continue same. ~  FLP 11/13 on diet alone showed TChol 231, TG 49, HDL 67, LDL 151..,. rec refer to Lipid clinic. ~  [place]en[place]4/14 on diet + Niacin showed TChol 233, TG 78, HDL 75, LDL 146  ~  FLP 1/15 in hosp showed Tchol 235, TG 79, HDL 64, LDL 155 => ZETIA 151md started... ~  [place] on Zetia10 showed TChol 172, TG 74, HDL 59, LDL 89... Continue same... ~  FLE[place]/16 on Zetia10 showed TChol 167, TG 66, HDL 61, LDL 93  HYPOTHYROIDISM (ICD-244.9) - currently on SYNTHROID 7580md... ~  labs 4/09 on Levoth88 showed TSH = 0.10... rec to decr Synthroid to 26m20m... ~  labs 9/09 on Levoth75 showed TSH = 1.67 ~  labs 5/10 on Levoth75 showed TSH = 2.92... Continue  same. ~  labs 9/11 on Levoth75 showed TSH= 1.37 ~  Labs 5/12 on Levothy75 showed TSH= 4.33 ~  Labs 10/12 on Levothy75 showed TSH= 1.51... Continue same. ~  Labs 11/13 on Levothy75 showed TSH= 1.55 ~  Labs 4/14 on Levothy75 showed TSH= 3.10 ~  Labs 3/15 on Levothy75 showed TSH= 4.99... Reminded to take every AM on empty stomach etc. ~  Labs 3/16 showed TSH= 3.24  HEMORRHOIDS (ICD-455.6) - last colonoscopy 2/09 by Amy Amy Lane showed only sm hems... f/u 81yr11yrYN - followed by  DrMcPhail and Dx w/ kraurosis vulvae 7/10... Prev on Premarin VagCream. ~  11/10: she discussed the Dx w/ me & will inquire about poss hyst/ BSO... ~  4/11:  eval in the [facility] by Dr Illene Bolus showed vulvar dermatitis & dryness w/ rec for "Crisco" Tid! 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   FIBROMYALGIA (ICD-729.1) - she has been doing Yoga classes and this has helped... ~  She saw DrNorris 9/14 w/ neck pain> CT CSpine showed degen disc dis & he rec PT... ~  3/16: she is c/o persistent bilat hand pain & swelling, not much better w/ OTC NSAIDs; we decided to check Anti-CCP (neg) & Sed (15); we will Rx w/ Tramadol50Tid prn, hot soaks, & refer to Rheum for further eval...  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  Hx of TRANSIENT ISCHEMIC ATTACK (ICD-435.9) - on ASA 15m/d & COUMADIN> she has been followed by Amy Amy Lane; [facility] ~  1/12: f/u eval by Amy Amy Lane reviewed... ~  6/13: she presented w/ ?TIA symptoms & eval was neg; review by Amy Amy Lane indicated prob Migraine & everything resolved... ~  1/15: she had another episode, Hosp by [facility] x2d but felt to prob be Migraine related... ~  11/15: she had f/u appt w/ Amy Amy Lane> Hx Ehlers-Danlos, Migraines, & c/o some leg weakness (some difficulty rising from squat); he  did EMG/NCV (neg-no myopathy, no neuropathy) & rec phys therapy  Hx of SYNCOPE (ICD-780.2) - felt to be neurally mediated, ? related to long QT, no recur since pacer placed...  MIGRAINE HEADACHE >> she presented w/ ?TIA symptoms 6/13 & eval was neg; review by Amy Amy Lane indicated prob Migraine & everything resolved, no recurrence... ~  Seen by Neuro 2/15 Amy Amy Lane w/ migraines, memory deficit, abn of gait;   DEPRESSION (ICD-311) - she takes LORAZEPAM 186mhs for insomnia... she prev [place] DrGraves [place]. ~  11/10: poor appetite & some wt loss prob related to depression & we discussed trial Lexapro 1066m... ~  3/11: she is very pleased- feels better, gained 5# w/ Ensure, etc... ~  9/11:  "I feel great" & Lexapro continued... ~  She was able to stop the Lexapro 1/12 (also stopped the Lipitor) & has done well off it ever since... ~  10/13:  She called w/ situational depression due to marital stress, getting counseling, etc; started PROZAC 65m67m& she reports much improved... ~  2015: she is off the Prozac and using Ativan 1mg 25m... ~  9/15: on Ativan1mg p58mand CELEXA20 called in several mo ago & she is much improved...  CARCINOMA, BASAL CELL (ICD-173.9) - [facility] ~  12/11:  She reports bx of rash on legs= lichen planus per DrHall, treated w/ topical ointment... ~  2/13:  She had a skin lesion removed from her neck= basal cell ca & wide excision performed by DrLupton.  Health Maintenance: ~  GI:  followed by Amy Amy Lane w/ colonoscopy 2/09 showing only sm hems... ~  GYN:  followed by DrMcPhail, and Dr. Erin CIllene BolusC[facility]... ~  Labs 9/11 showed Vit D level = 39 & rec to start 1000 u OTC supplement... ~  Immunizations:  she had TDAP 3/11... she gets yearly Flu vaccine in the Fall of the yr...    Past Surgical  History  Procedure Laterality Date  . Complex mitral valve repair      at [facility]  . Left  carotid endarterectomy  11/2003    by Dr. Delton See  . Pacemaker insertion      medtronic kappa [phone]  . Removed chalazion from left eye lid  09/2011    Dr. Satira Sark    Outpatient Encounter Prescriptions as of 11/30/2014  Medication Sig  . acebutolol (SECTRAL) 200 MG capsule TAKE 1 CAPSULE (200 MG TOTAL) BY MOUTH AT BEDTIME.  Marland Kitchen BISACODYL PO Take 1 tablet by mouth. Every 3 days  . citalopram (CELEXA) 20 MG tablet START WITH 1 TABLET BY MOUTH DAILY, MAY INCREASE TO 2 DAILY IF NECESSARY  . ELIQUIS 5 MG TABS tablet TAKE 1 TABLET (5 MG TOTAL) BY MOUTH 2 (TWO) TIMES DAILY.  Marland Kitchen ezetimibe (ZETIA) 10 MG tablet TAKE 1 TABLET (10 MG TOTAL) BY MOUTH DAILY.  Marland Kitchen levothyroxine (SYNTHROID, LEVOTHROID) 75 MCG tablet TAKE 1 TABLET BY MOUTH EVERY DAY  . Linaclotide (LINZESS) 145 MCG CAPS capsule Take 1 capsule (145 mcg total) by mouth daily.  Marland Kitchen LORazepam (ATIVAN) 1 MG tablet TAKE 1 TABLET BY MOUTH 3 TIMES A DAY AS NEEDED FOR ANXIETY  . Probiotic Product (ALIGN) 4 MG CAPS Take 4 mg by mouth every morning.  . traMADol (ULTRAM) 50 MG tablet Take 1 tablet (50 mg total) by mouth 3 (three) times daily as needed for moderate pain.  . [DISCONTINUED] citalopram (CELEXA) 20 MG tablet START WITH 1 TABLET BY MOUTH DAILY, MAY INCREASE TO 2 DAILY IF NECESSARY  . [DISCONTINUED] levothyroxine (SYNTHROID, LEVOTHROID) 75 MCG tablet TAKE 1 TABLET BY MOUTH EVERY DAY  . [DISCONTINUED] LORazepam (ATIVAN) 1 MG tablet TAKE 1 TABLET BY MOUTH 3 TIMES A DAY AS NEEDED FOR ANXIETY  . [DISCONTINUED] traMADol (ULTRAM) 50 MG tablet Take 1 tablet (50 mg total) by mouth 3 (three) times daily as needed for moderate pain.  . [DISCONTINUED] ZETIA 10 MG tablet TAKE 1 TABLET (10 MG TOTAL) BY MOUTH DAILY.   No facility-administered encounter medications on file as of 11/30/2014.    Allergies  Allergen Reactions  . Other Other (See Comments)    All QTc prolongation drugs  . Statins Other (See Comments)    myalgias    Current Medications, Allergies,  Past Medical History, Past Surgical History, Family History, and Social History were reviewed in [employer] record.    Review of Systems         See HPI - all other systems neg except as noted... The patient complains of dyspnea on exertion.  The patient denies anorexia, fever, weight loss, weight gain, vision loss, decreased hearing, hoarseness, chest pain, syncope, peripheral edema, prolonged cough, headaches, hemoptysis, abdominal pain, melena, hematochezia, severe indigestion/heartburn, hematuria, incontinence, muscle weakness, suspicious skin lesions, transient blindness, difficulty walking, depression, unusual weight change, abnormal bleeding, enlarged lymph nodes, and angioedema.     Objective:   Physical Exam     WD, Thin, 64 y/o WF in NAD... she is 5'11"Tall and 141# = BMI~20... Vital Signs:  Reviewed... GENERAL:  Alert & oriented; pleasant & cooperative... HEENT:  [place]/AT, EOM-wnl, PERRLA, EACs-clear, TMs-wnl, NOSE-clear, THROAT-clear & wnl. NECK:  Supple w/ fair ROM; no JVD; normal carotid impulses w/o bruits, left CAE scar; no thyromegaly or nodules palpated; no lymphadenopathy. CHEST:  Clear to P & A; without wheezes/ rales/ or rhonchi heard... HEART:  Regular Rhythm; pacer on left, without murmurs/ rubs/ or gallops detected... ABDOMEN:  Soft & nontender; normal bowel sounds; no organomegaly or masses palpated... EXT: without deformities or arthritic changes; no varicose veins/ venous insuffic/ or edema. NEURO:  CN's intact;  no focal neuro deficits... DERM:  s/p skin cancer removed from left leg; min rash noted...  RADIOLOGY DATA:  Reviewed in the EPIC EMR & discussed w/ the patient...  LABORATORY DATA:  Reviewed in the EPIC EMR & discussed w/ the patient...   Assessment & Plan:    MVP/ MV Repair> Tachy-Brady/ PACER> Dysautonomia>  Followed by Cherly Amy Lane & Amy Amy Lane, doing well on Eliquis & Sectral, continue same...  Cerebrovasc Dis>  Off ASA now  that she's on Eliquis, s/p left CAE 11/05, no recurrent cerebral ischemic symptoms & CDopplers stable...  ?DVT while in [place]> moot issue at present; prev on Coumadin now Eliquis; leg swelling diminished on the compression hose, no salt, elevation, etc; she is back to her exercise program & back to baseline...  CHOL>  She feels much better off the Lipitor & FLP improved w/ Zetia10 started 1/15 [facility]- continue same...  Hypothyroid>  Stable on the Synthroid 27mg/d...  GYN>  [facility]  EHLERS-DANLOS>  Aware, stable, she has been feeling better & in great spirits...  Migraines> Hosp 6/13 & 1/15 w/ ?TIA but Amy Amy Lane thought likely Migraine syndrome- resolved, neg work up, no recurrence & they are following... Neuro> she mentions several symtoms that she is concerned about- clumsy, balance off, subjective leg weakness- & she is concerned; therefore refer to Neurology for eval...  Depression>  Off Prozac20 & on Celexa20 & Ativan prn; she & husb received counseling...  Other medical problems as noted...   Patient's Medications  New Prescriptions   No medications on file  Previous Medications   ACEBUTOLOL (SECTRAL) 200 MG CAPSULE    TAKE 1 CAPSULE (200 MG TOTAL) BY MOUTH AT BEDTIME.   BISACODYL PO    Take 1 tablet by mouth. Every 3 days   ELIQUIS 5 MG TABS TABLET    TAKE 1 TABLET (5 MG TOTAL) BY MOUTH 2 (TWO) TIMES DAILY.   LINACLOTIDE (LINZESS) 145 MCG CAPS CAPSULE    Take 1 capsule (145 mcg total) by mouth daily.   PROBIOTIC PRODUCT (ALIGN) 4 MG CAPS    Take 4 mg by mouth every morning.  Modified Medications   Modified Medication Previous Medication   CITALOPRAM (CELEXA) 20 MG TABLET citalopram (CELEXA) 20 MG tablet      START WITH 1 TABLET BY MOUTH DAILY, MAY INCREASE TO 2 DAILY IF NECESSARY    START WITH 1 TABLET BY MOUTH DAILY, MAY INCREASE TO 2 DAILY IF NECESSARY   EZETIMIBE (ZETIA) 10 MG TABLET ZETIA 10 MG tablet      TAKE 1 TABLET (10 MG  TOTAL) BY MOUTH DAILY.    TAKE 1 TABLET (10 MG TOTAL) BY MOUTH DAILY.   LEVOTHYROXINE (SYNTHROID, LEVOTHROID) 75 MCG TABLET levothyroxine (SYNTHROID, LEVOTHROID) 75 MCG tablet      TAKE 1 TABLET BY MOUTH EVERY DAY    TAKE 1 TABLET BY MOUTH EVERY DAY   LORAZEPAM (ATIVAN) 1 MG TABLET LORazepam (ATIVAN) 1 MG tablet      TAKE 1 TABLET BY MOUTH 3 TIMES A DAY AS NEEDED FOR ANXIETY    TAKE 1 TABLET BY MOUTH 3 TIMES A DAY AS NEEDED FOR ANXIETY   TRAMADOL (ULTRAM) 50 MG TABLET traMADol (ULTRAM) 50 MG tablet      Take 1 tablet (50 mg total) by  mouth 3 (three) times daily as needed for moderate pain.    Take 1 tablet (50 mg total) by mouth 3 (three) times daily as needed for moderate pain.  Discontinued Medications   No medications on file

## 2014-12-01 ENCOUNTER — Other Ambulatory Visit: Payer: Self-pay

## 2014-12-01 MED ORDER — LINACLOTIDE 145 MCG PO CAPS: 145.0000 ug | ORAL_CAPSULE | Freq: Every day | ORAL | Status: DC

## 2014-12-09 ENCOUNTER — Telehealth: Payer: Self-pay | Admitting: Neurology

## 2014-12-09 NOTE — Telephone Encounter (Signed)
Patient is calling because she just saw her eye doctor and he advised her to see Dr. Jannifer Franklin. He thinks she may have some neurological issues. Please call and discuss. Thank you.

## 2014-12-09 NOTE — Telephone Encounter (Signed)
I called the patient. She went to see her eye doctor because she noticed spots in her vision and blurred vision. He felt like it may have something to do with Ehler-Danlos. She also c/o being more confused lately. She states she fell twice over the summer. Her eye doctor advised she see Dr. Jannifer Franklin. Appointment scheduled for Friday 11/18.

## 2014-12-11 ENCOUNTER — Ambulatory Visit (INDEPENDENT_AMBULATORY_CARE_PROVIDER_SITE_OTHER): Payer: PRIVATE HEALTH INSURANCE | Admitting: Neurology

## 2014-12-11 ENCOUNTER — Encounter: Payer: Self-pay | Admitting: Neurology

## 2014-12-11 VITALS — BP 130/79 | HR 63 | Ht 70.0 in | Wt 139.0 lb

## 2014-12-11 DIAGNOSIS — R413 Other amnesia: Secondary | ICD-10-CM

## 2014-12-11 DIAGNOSIS — M6281 Muscle weakness (generalized): Secondary | ICD-10-CM

## 2014-12-11 DIAGNOSIS — G43001 Migraine without aura, not intractable, with status migrainosus: Secondary | ICD-10-CM

## 2014-12-11 DIAGNOSIS — Q796 Ehlers-Danlos syndrome, unspecified: Secondary | ICD-10-CM

## 2014-12-11 DIAGNOSIS — R269 Unspecified abnormalities of gait and mobility: Secondary | ICD-10-CM

## 2014-12-11 MED ORDER — DONEPEZIL HCL 5 MG PO TABS: 5.0000 mg | ORAL_TABLET | Freq: Every day | ORAL | Status: DC

## 2014-12-11 NOTE — Patient Instructions (Addendum)
WE will check CT of the head and start Aricept for the memory.  Begin Aricept (donepezil) at 5 mg at night for one month. If this medication is well-tolerated, please call our office and we will call in a prescription for the 10 mg tablets. Look out for side effects that may include nausea, diarrhea, weight loss, or stomach cramps. This medication will also cause a runny nose, therefore there is no need for allergy medications for this purpose.  Fall Prevention in the Home  Falls can cause injuries and can affect people from all age groups. There are many simple things that you can do to make your home safe and to help prevent falls. WHAT CAN I DO ON THE OUTSIDE OF MY HOME?  Regularly repair the edges of walkways and driveways and fix any cracks.  Remove high doorway thresholds.  Trim any shrubbery on the main path into your home.  Use bright outdoor lighting.  Clear walkways of debris and clutter, including tools and rocks.  Regularly check that handrails are securely fastened and in good repair. Both sides of any steps should have handrails.  Install guardrails along the edges of any raised decks or porches.  Have leaves, snow, and ice cleared regularly.  Use sand or salt on walkways during winter months.  In the garage, clean up any spills right away, including grease or oil spills. WHAT CAN I DO IN THE BATHROOM?  Use night lights.  Install grab bars by the toilet and in the tub and shower. Do not use towel bars as grab bars.  Use non-skid mats or decals on the floor of the tub or shower.  If you need to sit down while you are in the shower, use a plastic, non-slip stool.Marland Kitchen  Keep the floor dry. Immediately clean up any water that spills on the floor.  Remove soap buildup in the tub or shower on a regular basis.  Attach bath mats securely with double-sided non-slip rug tape.  Remove throw rugs and other tripping hazards from the floor. WHAT CAN I DO IN THE  BEDROOM?  Use night lights.  Make sure that a bedside light is easy to reach.  Do not use oversized bedding that drapes onto the floor.  Have a firm chair that has side arms to use for getting dressed.  Remove throw rugs and other tripping hazards from the floor. WHAT CAN I DO IN THE KITCHEN?   Clean up any spills right away.  Avoid walking on wet floors.  Place frequently used items in easy-to-reach places.  If you need to reach for something above you, use a sturdy step stool that has a grab bar.  Keep electrical cables out of the way.  Do not use floor polish or wax that makes floors slippery. If you have to use wax, make sure that it is non-skid floor wax.  Remove throw rugs and other tripping hazards from the floor. WHAT CAN I DO IN THE STAIRWAYS?  Do not leave any items on the stairs.  Make sure that there are handrails on both sides of the stairs. Fix handrails that are broken or loose. Make sure that handrails are as long as the stairways.  Check any carpeting to make sure that it is firmly attached to the stairs. Fix any carpet that is loose or worn.  Avoid having throw rugs at the top or bottom of stairways, or secure the rugs with carpet tape to prevent them from moving.  Make sure  that you have a light switch at the top of the stairs and the bottom of the stairs. If you do not have them, have them installed. WHAT ARE SOME OTHER FALL PREVENTION TIPS?  Wear closed-toe shoes that fit well and support your feet. Wear shoes that have rubber soles or low heels.  When you use a stepladder, make sure that it is completely opened and that the sides are firmly locked. Have someone hold the ladder while you are using it. Do not climb a closed stepladder.  Add color or contrast paint or tape to grab bars and handrails in your home. Place contrasting color strips on the first and last steps.  Use mobility aids as needed, such as canes, walkers, scooters, and  crutches.  Turn on lights if it is dark. Replace any light bulbs that burn out.  Set up furniture so that there are clear paths. Keep the furniture in the same spot.  Fix any uneven floor surfaces.  Choose a carpet design that does not hide the edge of steps of a stairway.  Be aware of any and all pets.  Review your medicines with your healthcare provider. Some medicines can cause dizziness or changes in blood pressure, which increase your risk of falling. Talk with your health care provider about other ways that you can decrease your risk of falls. This may include working with a physical therapist or trainer to improve your strength, balance, and endurance.   This information is not intended to replace advice given to you by your health care provider. Make sure you discuss any questions you have with your health care provider.   Document Released: 12/30/2001 Document Revised: 05/26/2014 Document Reviewed: 02/13/2014 Elsevier Interactive Patient Education [employer].

## 2014-12-11 NOTE — Progress Notes (Signed)
Reason for visit: Visual disturbance  Amy Lane is an 64 y.o. female  History of present illness:  Amy Lane is a 64 year old right-handed white female with a history of Ehlers-Danlos syndrome, fibromyalgia, migraine headache, and cerebrovascular disease. The patient has had episodes of flashing lights in her visual field off of the right recently. She was seen by her ophthalmologist who did not find any retinal disease. The patient has reported similar events in the past that occurred on both sides of the visual field, was felt secondary to migraine or migraine equivalent. The patient has also reported some memory issues, she believes that this has gradually worsened over time. She is having increasing problems with directions while driving, and difficulty with word finding. She is concerned about this issue. She also has had problems with balance that has altered over time, she has fallen on occasion. She reports no definite focal numbness of the face, arms, or legs. She has had no slurred speech. She returns to this office for an evaluation.  Past Medical History  Diagnosis Date  . Herpes zoster   . SVT (supraventricular tachycardia) ([place])   . PSVT (paroxysmal supraventricular tachycardia) ([place])   . PAT (paroxysmal atrial tachycardia) ([place])   . History of tachycardia-bradycardia syndrome   . Atrial fibrillation ([place])   . Dysautonomia   . History of syncope   . Cardiac pacemaker     DDD  MDT  . Long term (current) use of anticoagulants   . Allergic rhinitis   . Mitral valve prolapse   . Cerebrovascular disease   . Hypercholesteremia   . Hypothyroidism   . Hemorrhoids   . Ehler's-Danlos syndrome   . Fibromyalgia   . History of transient ischemic attack   . Depression   . Carcinomas, basal cell   . Skin desquamation     inflammative vaginitis  . Abnormal gait   . Hypercholesterolemia   . Headache(784.0)     Migraine    Past Surgical History  Procedure  Laterality Date  . Complex mitral valve repair      at [facility]  . Left carotid endarterectomy  11/2003    by Dr. Delton See  . Pacemaker insertion      medtronic kappa 901  . Removed chalazion from left eye lid  09/2011    Dr. Satira Sark    Family History  Problem Relation Age of Onset  . Leukemia Other     Nephew  . Lung cancer Mother   . Prostate cancer Father   . Alzheimer's disease Father   . Heart disease Father   . Mitral valve prolapse Brother   . Diabetes Sister   . Mitral valve prolapse Sister   . Heart disease      entire maternal side of family  . Cancer      entire maternal side of family  . Ehlers-Danlos syndrome Father     Social history:  reports that she has never smoked. She has never used smokeless tobacco. She reports that she does not drink alcohol or use illicit drugs.    Allergies  Allergen Reactions  . Other Other (See Comments)    All QTc prolongation drugs  . Statins Other (See Comments)    myalgias    Medications:  Prior to Admission medications   Medication Sig Start Date End Date Taking? Authorizing Provider  acebutolol (SECTRAL) 200 MG capsule TAKE 1 CAPSULE (200 MG TOTAL) BY MOUTH AT BEDTIME. 05/26/14  Yes  Josue Hector, MD  BISACODYL PO Take 1 tablet by mouth. Every 3 days   Yes Historical Provider, MD  citalopram (CELEXA) 20 MG tablet START WITH 1 TABLET BY MOUTH DAILY, MAY INCREASE TO 2 DAILY IF NECESSARY 11/30/14  Yes Noralee Space, MD  ELIQUIS 5 MG TABS tablet TAKE 1 TABLET (5 MG TOTAL) BY MOUTH 2 (TWO) TIMES DAILY. 07/29/14  Yes Josue Hector, MD  ezetimibe (ZETIA) 10 MG tablet TAKE 1 TABLET (10 MG TOTAL) BY MOUTH DAILY. 11/30/14  Yes Noralee Space, MD  levothyroxine (SYNTHROID, LEVOTHROID) 75 MCG tablet TAKE 1 TABLET BY MOUTH EVERY DAY 11/30/14  Yes Noralee Space, MD  Linaclotide [facility]) 145 MCG CAPS capsule Take 1 capsule (145 mcg total) by mouth daily. 12/01/14  Yes Milus Banister, MD  LORazepam (ATIVAN) 1 MG tablet TAKE 1  TABLET BY MOUTH 3 TIMES A DAY AS NEEDED FOR ANXIETY 11/30/14  Yes Noralee Space, MD  Probiotic Product (ALIGN) 4 MG CAPS Take 4 mg by mouth every morning.   Yes Historical Provider, MD  traMADol (ULTRAM) 50 MG tablet Take 1 tablet (50 mg total) by mouth 3 (three) times daily as needed for moderate pain. 11/30/14  Yes Noralee Space, MD    ROS:  Out of a complete 14 system review of symptoms, the patient complains only of the following symptoms, and all other reviewed systems are negative.  Runny nose Light sensitivity, blurred vision Insomnia Incontinence of bladder Joint pain, joint swelling, back pain, walking difficulty, neck stiffness Itching Bruising easily Memory loss, headache, numbness, speech difficulty, weakness  Blood pressure 130/79, pulse 63, height 5\' 10"  (1.778 m), weight 139 lb (63.05 kg).  Physical Exam  General: The patient is alert and cooperative at the time of the examination.  Skin: No significant peripheral edema is noted.   Neurologic Exam  Mental status: The patient is alert and oriented x 3 at the time of the examination. The patient has apparent normal recent and remote memory, with an apparently normal attention span and concentration ability. Mini-Mental Status Examination done today shows a total score 29/30. The patient is able to name 9 animals in 30 seconds.   Cranial nerves: Facial symmetry is present. Speech is normal, no aphasia or dysarthria is noted. Extraocular movements are full. Visual fields are full.  Motor: The patient has good strength in all 4 extremities.  Sensory examination: Soft touch sensation is symmetric on the face, arms, and legs.  Coordination: The patient has good finger-nose-finger and heel-to-shin bilaterally.  Gait and station: The patient has a normal gait. Tandem gait is unsteady. Romberg is negative, but is unsteady. No drift is seen.  Reflexes: Deep tendon reflexes are symmetric.   Assessment/Plan:  1.  Ehlers-Danlos syndrome  2. Migraine headache  3. Gait disorder  4. Memory disorder  The patient has perceived a worsening of memory, she likely has minimum cognitive impairment. She has had an alteration in balance as well. The patient will undergo a repeat CT scan of the head, MRI cannot be done as she has a defibrillator/pacemaker in place. The patient will be placed on Aricept, she will follow-up in 6 months. The visual disturbances are consistent with her prior migraine headache.  [name] MD 12/11/2014 7:54 PM  [facility] [address] [place], [place] [address]  Phone [phone] Fax [phone]

## 2014-12-12 ENCOUNTER — Other Ambulatory Visit: Payer: Self-pay | Admitting: Pulmonary Disease

## 2014-12-16 ENCOUNTER — Encounter: Payer: Self-pay | Admitting: *Deleted

## 2014-12-22 ENCOUNTER — Ambulatory Visit
Admission: RE | Admit: 2014-12-22 | Discharge: 2014-12-22 | Disposition: A | Discharge status: Home / Self Care | Payer: No Typology Code available for payment source | Source: Ambulatory Visit | Attending: Neurology | Admitting: Neurology

## 2014-12-22 DIAGNOSIS — R269 Unspecified abnormalities of gait and mobility: Secondary | ICD-10-CM

## 2014-12-22 DIAGNOSIS — R413 Other amnesia: Secondary | ICD-10-CM | POA: Diagnosis not present

## 2014-12-22 DIAGNOSIS — G43001 Migraine without aura, not intractable, with status migrainosus: Secondary | ICD-10-CM

## 2014-12-22 DIAGNOSIS — M6281 Muscle weakness (generalized): Secondary | ICD-10-CM

## 2014-12-22 DIAGNOSIS — Q796 Ehlers-Danlos syndrome, unspecified: Secondary | ICD-10-CM

## 2014-12-25 ENCOUNTER — Encounter: Payer: Self-pay | Admitting: Internal Medicine

## 2014-12-26 ENCOUNTER — Telehealth: Payer: Self-pay | Admitting: Neurology

## 2014-12-26 NOTE — Telephone Encounter (Signed)
I called the patient. The CT of the brain is normal.   CT brain 12/24/14:  IMPRESSION:  Normal CT head (without). No change from CT on 01/28/13.

## 2015-02-01 ENCOUNTER — Ambulatory Visit (INDEPENDENT_AMBULATORY_CARE_PROVIDER_SITE_OTHER): Payer: No Typology Code available for payment source | Admitting: *Deleted

## 2015-02-01 DIAGNOSIS — I495 Sick sinus syndrome: Secondary | ICD-10-CM | POA: Diagnosis not present

## 2015-02-01 LAB — CUP PACEART REMOTE DEVICE CHECK
Battery Impedance: 279 Ohm
Battery Remaining Longevity: 111 mo
Battery Voltage: 2.79 V
Brady Statistic AP VP Percent: 0 %
Brady Statistic AS VS Percent: 25 %
Date Time Interrogation Session: 20170109161653
Implantable Lead Location: 753859
Implantable Lead Location: 753860
Implantable Lead Model: 5076
Lead Channel Impedance Value: 419 Ohm
Lead Channel Pacing Threshold Amplitude: 0.625 V
Lead Channel Sensing Intrinsic Amplitude: 1.4 mV
Lead Channel Setting Pacing Amplitude: 2.5 V
Lead Channel Setting Pacing Pulse Width: 0.4 ms
MDC IDC LEAD IMPLANT DT: 19981106
MDC IDC LEAD IMPLANT DT: 20031031
MDC IDC MSMT LEADCHNL RA PACING THRESHOLD PULSEWIDTH: 0.4 ms
MDC IDC MSMT LEADCHNL RV IMPEDANCE VALUE: 624 Ohm
MDC IDC MSMT LEADCHNL RV PACING THRESHOLD AMPLITUDE: 0.875 V
MDC IDC MSMT LEADCHNL RV PACING THRESHOLD PULSEWIDTH: 0.4 ms
MDC IDC MSMT LEADCHNL RV SENSING INTR AMPL: 5.6 mV
MDC IDC SET LEADCHNL RA PACING AMPLITUDE: 2 V
MDC IDC SET LEADCHNL RV SENSING SENSITIVITY: 2.8 mV
MDC IDC STAT BRADY AP VS PERCENT: 75 %
MDC IDC STAT BRADY AS VP PERCENT: 0 %

## 2015-02-01 NOTE — Progress Notes (Signed)
Remote pacemaker transmission.   

## 2015-02-08 ENCOUNTER — Telehealth: Payer: Self-pay | Admitting: Pulmonary Disease

## 2015-02-08 MED ORDER — OSELTAMIVIR PHOSPHATE 75 MG PO CAPS: 75.0000 mg | ORAL_CAPSULE | Freq: Two times a day (BID) | ORAL | Status: DC

## 2015-02-08 NOTE — Telephone Encounter (Signed)
Per SN >> Tamiflu 75mg  #10 1 po BID, rest, fluids, Tylenol.  Pt is aware of SN's recommendations. Rx has been sent in. Nothing further was needed.

## 2015-02-08 NOTE — Telephone Encounter (Signed)
Spoke with pt. Reports fever, chills, dry cough and chest tightness. Onset was 3 days ago. Denies wheezing, SOB or sinus issues. Has been taking Tylenol to treat the fever. Thinks this might be the flu. Would like something called in.  Allergies  Allergen Reactions  . Other Other (See Comments)    All QTc prolongation drugs  . Statins Other (See Comments)    myalgias   SN - please advise. Thanks.

## 2015-02-17 ENCOUNTER — Other Ambulatory Visit: Payer: Self-pay | Admitting: Pulmonary Disease

## 2015-02-18 NOTE — Telephone Encounter (Signed)
Per SN- okay to refill  I have called in rx to [facility]

## 2015-02-18 NOTE — Telephone Encounter (Signed)
Pt last had refill on ativan 1 mg 11/30/14 #90 x 0 refills TAKE 1 TABLET BY MOUTH 3 TIMES A DAY AS NEEDED FOR ANXIETY  Please advise on refill SN thanks

## 2015-02-19 ENCOUNTER — Encounter: Payer: Self-pay | Admitting: Cardiology

## 2015-03-02 ENCOUNTER — Other Ambulatory Visit: Payer: Self-pay

## 2015-03-02 MED ORDER — DONEPEZIL HCL 5 MG PO TABS: 5.0000 mg | ORAL_TABLET | Freq: Every day | ORAL | Status: DC

## 2015-03-05 ENCOUNTER — Telehealth: Payer: Self-pay | Admitting: Neurology

## 2015-03-05 ENCOUNTER — Encounter: Payer: Self-pay | Admitting: Cardiology

## 2015-03-05 MED ORDER — DONEPEZIL HCL 10 MG PO TABS: 10.0000 mg | ORAL_TABLET | Freq: Every day | ORAL | Status: DC

## 2015-03-05 NOTE — Telephone Encounter (Signed)
Last OV note says: Begin Aricept (donepezil) at 5 mg at night for one month. If this medication is well-tolerated, please call our office and we will call in a prescription for the 10 mg tablets

## 2015-03-05 NOTE — Telephone Encounter (Signed)
Pt called said she ready to increase donepezil (ARICEPT) 5 MG tablet to regular dose. She is doing good so far.

## 2015-03-08 ENCOUNTER — Telehealth: Payer: Self-pay | Admitting: Neurology

## 2015-03-08 NOTE — Telephone Encounter (Signed)
I called patient. The patient had an event yesterday associated with confusion, difficulty with speech and word finding, also associated with flashing lights in the vision. The flashing lights lasted about 8 minutes, and the speech issues lasted about 50-55 minutes, then the patient got a bad headache after the episode cleared. The episode is most consistent with migraine, she has a history of migraine headache. She feels normal today. I do not think any further workup is indicated.

## 2015-03-08 NOTE — Telephone Encounter (Signed)
Pt called said last night (03/07/15) at a restaruant with her husband she had an episode where she was confused as to who the president was, could not pronounce Judeth Horn name, she could not remember what the "check " was called. She said she did know what day it was and where she was. She said she could not make any sense with her speech, this lasted between 45-55 minutes which is the longest an episode has lasted. The only reason she didn't go to ED was she didn't have any other symptoms of a stroke. They went to a friend's house that is a Therapist, sports. Her friend said the pt's color was pale gray and she called the ED. Her friend spoke with the ED neurologist on call. Pt said by this time her color had returned and she was speaking clearly but she got a bad HA. She went home and took 2 tylenol 325 each and went to bed. Today she said she feels just fine.

## 2015-03-10 ENCOUNTER — Other Ambulatory Visit: Payer: Self-pay | Admitting: *Deleted

## 2015-03-10 ENCOUNTER — Other Ambulatory Visit: Payer: Self-pay | Admitting: Pulmonary Disease

## 2015-03-10 DIAGNOSIS — I6523 Occlusion and stenosis of bilateral carotid arteries: Secondary | ICD-10-CM

## 2015-03-10 MED ORDER — CITALOPRAM HYDROBROMIDE 20 MG PO TABS: 20.0000 mg | ORAL_TABLET | Freq: Every day | ORAL | Status: DC

## 2015-03-12 NOTE — Progress Notes (Signed)
Patient ID: Amy Lane, female   DOB: February 15, 1950, 65 y.o.   MRN: [id number] Amy Lane is seen to re-establish with general cardiology  I last saw her in 2013  She has been seeing Dr Caryl Comes   for sinus bradycardia status post pacemaker, status post generator replacement and lead repair March 2011.  She also has a history of Ehlers-Danlos syndrome in the context of a marfanoid habitus, mitral valve prolapse status post mitral valve repair at the [facility] with prior TIA on chronic anticoagulation  as well as syncope due to diagnosis of long QT syndrome. SHe has had significant dysautonomic symptoms consistent with POTS S/P left CEA with duplex 03/18/15 reviewed and 1-39% plaque no stenosis   Husband has had recent battle with prostate cancer  She is active Denies palpitations , syncope recurrent TIA or chest pain   Cognitive defect better on aricept  ? Of MS with LE weakness Unable to do MRI due to pacer    ROS: Denies fever, malais, weight loss, blurry vision, decreased visual acuity, cough, sputum, SOB, hemoptysis, pleuritic pain, palpitaitons, heartburn, abdominal pain, melena, lower extremity edema, claudication, or rash.  All other systems reviewed and negative  General: Affect appropriate Healthy:  appears stated age 66: normal Neck supple with no adenopathy JVP normal left CEA bruits no thyromegaly Lungs clear with no wheezing and good diaphragmatic motion Heart:  S1/S2 no murmur,rub, gallop or click pacer under left clavicle  PMI normal Abdomen: benighn, BS positve, no tenderness, no AAA no bruit.  No HSM or HJR Distal pulses intact with no bruits No edema Neuro non-focal Skin warm and dry LE weakness    Current Outpatient Prescriptions  Medication Sig Dispense Refill  . acebutolol (SECTRAL) 200 MG capsule TAKE 1 CAPSULE (200 MG TOTAL) BY MOUTH AT BEDTIME. 90 capsule 3  . BISACODYL PO Take 1 tablet by mouth. Every 3 days    . citalopram (CELEXA) 20 MG tablet  Take 1 tablet (20 mg total) by mouth daily. 90 tablet 1  . donepezil (ARICEPT) 10 MG tablet Take 1 tablet (10 mg total) by mouth at bedtime. 30 tablet 1  . ELIQUIS 5 MG TABS tablet TAKE 1 TABLET (5 MG TOTAL) BY MOUTH 2 (TWO) TIMES DAILY. 60 tablet 11  . ezetimibe (ZETIA) 10 MG tablet TAKE 1 TABLET (10 MG TOTAL) BY MOUTH DAILY. 90 tablet 1  . levothyroxine (SYNTHROID, LEVOTHROID) 75 MCG tablet TAKE 1 TABLET BY MOUTH EVERY DAY 90 tablet 1  . levothyroxine (SYNTHROID, LEVOTHROID) 75 MCG tablet TAKE 1 TABLET BY MOUTH EVERY DAY 90 tablet 1  . Linaclotide (LINZESS) 145 MCG CAPS capsule Take 1 capsule (145 mcg total) by mouth daily. 90 capsule 3  . LORazepam (ATIVAN) 1 MG tablet TAKE 1 TABLET BY MOUTH 3 TIMES A DAY AS NEEDED FOR ANXIETY 90 tablet 0  . oseltamivir (TAMIFLU) 75 MG capsule Take 1 capsule (75 mg total) by mouth 2 (two) times daily. 10 capsule 0  . Probiotic Product (ALIGN) 4 MG CAPS Take 4 mg by mouth every morning.    . traMADol (ULTRAM) 50 MG tablet Take 1 tablet (50 mg total) by mouth 3 (three) times daily as needed for moderate pain. 50 tablet 0   No current facility-administered medications for this visit.    Allergies  Other and Statins  Electrocardiogram:  Atrial pacing rate 77 somewhat long delay from P wave and atrial spike  ICRBBB normal ventricular complex 03/19/15  A pacing rate 73  RBBB  Assessment and Plan Pacer:  Reviewed Dr Aquilla Hacker last note normal function POTS:  Improved with no postural symptoms TIA:  CEA patent by duplex  Now on Eliquis with no bleeding issues ? Related to PAF MVrepair:  No murmur on exam f/u echo SBE Thyroid:   Lab Results  Component Value Date   TSH 3.24 04/07/2014   Chol:   Lab Results  Component Value Date   LDLCALC 93 04/06/2014   Neuro:  Continue aricept for cognitive dysfunction F//U with SK to see if pacer can be modified for MRI safely but doubt he would want To take leads out.  F/U neuro.  Consider starting Rx for MS  empirically   [name]

## 2015-03-18 ENCOUNTER — Ambulatory Visit (HOSPITAL_COMMUNITY)
Admission: RE | Admit: 2015-03-18 | Discharge: 2015-03-18 | Disposition: A | Discharge status: Home / Self Care | Payer: No Typology Code available for payment source | Source: Ambulatory Visit | Attending: Cardiology | Admitting: Cardiology

## 2015-03-18 DIAGNOSIS — I6523 Occlusion and stenosis of bilateral carotid arteries: Secondary | ICD-10-CM | POA: Diagnosis not present

## 2015-03-18 DIAGNOSIS — E785 Hyperlipidemia, unspecified: Secondary | ICD-10-CM | POA: Insufficient documentation

## 2015-03-19 ENCOUNTER — Encounter: Payer: Self-pay | Admitting: Cardiovascular Disease

## 2015-03-19 ENCOUNTER — Ambulatory Visit (INDEPENDENT_AMBULATORY_CARE_PROVIDER_SITE_OTHER): Payer: No Typology Code available for payment source | Admitting: Cardiovascular Disease

## 2015-03-19 VITALS — BP 106/60 | HR 65 | Ht 70.5 in | Wt 139.8 lb

## 2015-03-19 DIAGNOSIS — I6523 Occlusion and stenosis of bilateral carotid arteries: Secondary | ICD-10-CM | POA: Diagnosis not present

## 2015-03-19 NOTE — Patient Instructions (Signed)

## 2015-04-14 ENCOUNTER — Other Ambulatory Visit: Payer: Self-pay | Admitting: Pulmonary Disease

## 2015-04-19 ENCOUNTER — Other Ambulatory Visit: Payer: Self-pay | Admitting: Sports Medicine

## 2015-04-19 DIAGNOSIS — S93622D Sprain of tarsometatarsal ligament of left foot, subsequent encounter: Secondary | ICD-10-CM

## 2015-05-08 ENCOUNTER — Other Ambulatory Visit: Payer: Self-pay | Admitting: Neurology

## 2015-05-14 ENCOUNTER — Other Ambulatory Visit: Payer: Self-pay | Admitting: Neurology

## 2015-05-24 ENCOUNTER — Other Ambulatory Visit: Payer: Self-pay | Admitting: Pulmonary Disease

## 2015-05-24 ENCOUNTER — Other Ambulatory Visit: Payer: Self-pay | Admitting: Cardiovascular Disease

## 2015-05-31 ENCOUNTER — Ambulatory Visit (INDEPENDENT_AMBULATORY_CARE_PROVIDER_SITE_OTHER): Payer: No Typology Code available for payment source | Admitting: Pulmonary Disease

## 2015-05-31 ENCOUNTER — Ambulatory Visit (INDEPENDENT_AMBULATORY_CARE_PROVIDER_SITE_OTHER)
Admission: RE | Admit: 2015-05-31 | Discharge: 2015-05-31 | Disposition: A | Discharge status: Home / Self Care | Payer: No Typology Code available for payment source | Source: Ambulatory Visit | Attending: Pulmonary Disease | Admitting: Pulmonary Disease

## 2015-05-31 ENCOUNTER — Other Ambulatory Visit (INDEPENDENT_AMBULATORY_CARE_PROVIDER_SITE_OTHER): Payer: No Typology Code available for payment source

## 2015-05-31 ENCOUNTER — Encounter: Payer: Self-pay | Admitting: Pulmonary Disease

## 2015-05-31 VITALS — BP 108/60 | HR 61 | Temp 97.5°F | Ht 70.5 in | Wt 142.4 lb

## 2015-05-31 DIAGNOSIS — G901 Familial dysautonomia [Riley-Day]: Secondary | ICD-10-CM

## 2015-05-31 DIAGNOSIS — Q796 Ehlers-Danlos syndrome, unspecified: Secondary | ICD-10-CM

## 2015-05-31 DIAGNOSIS — Z86718 Personal history of other venous thrombosis and embolism: Secondary | ICD-10-CM | POA: Insufficient documentation

## 2015-05-31 DIAGNOSIS — E039 Hypothyroidism, unspecified: Secondary | ICD-10-CM

## 2015-05-31 DIAGNOSIS — I495 Sick sinus syndrome: Secondary | ICD-10-CM

## 2015-05-31 DIAGNOSIS — E7800 Pure hypercholesterolemia, unspecified: Secondary | ICD-10-CM | POA: Diagnosis not present

## 2015-05-31 DIAGNOSIS — I059 Rheumatic mitral valve disease, unspecified: Secondary | ICD-10-CM

## 2015-05-31 DIAGNOSIS — G909 Disorder of the autonomic nervous system, unspecified: Secondary | ICD-10-CM | POA: Diagnosis not present

## 2015-05-31 DIAGNOSIS — R269 Unspecified abnormalities of gait and mobility: Secondary | ICD-10-CM

## 2015-05-31 DIAGNOSIS — K5909 Other constipation: Secondary | ICD-10-CM

## 2015-05-31 DIAGNOSIS — Z23 Encounter for immunization: Secondary | ICD-10-CM | POA: Diagnosis not present

## 2015-05-31 DIAGNOSIS — Z950 Presence of cardiac pacemaker: Secondary | ICD-10-CM

## 2015-05-31 LAB — TSH: TSH: 2.08 u[IU]/mL (ref 0.35–4.50)

## 2015-05-31 LAB — COMPREHENSIVE METABOLIC PANEL
ALBUMIN: 4.3 g/dL (ref 3.5–5.2)
ALT: 13 U/L (ref 0–35)
AST: 18 U/L (ref 0–37)
Alkaline Phosphatase: 72 U/L (ref 39–117)
BUN: 10 mg/dL (ref 6–23)
CHLORIDE: 100 meq/L (ref 96–112)
CO2: 28 mEq/L (ref 19–32)
Calcium: 9.7 mg/dL (ref 8.4–10.5)
Creatinine, Ser: 0.72 mg/dL (ref 0.40–1.20)
GFR: 87.01 mL/min (ref >60.00)
Glucose, Bld: 99 mg/dL (ref 70–99)
POTASSIUM: 4.2 meq/L (ref 3.5–5.1)
SODIUM: 135 meq/L (ref 135–145)
Total Bilirubin: 0.4 mg/dL (ref 0.2–1.2)
Total Protein: 7.4 g/dL (ref 6.0–8.3)

## 2015-05-31 LAB — LIPID PANEL
CHOLESTEROL: 168 mg/dL (ref 0–200)
HDL: 59.6 mg/dL (ref >39.00)
LDL CALC: 87 mg/dL (ref 0–99)
NonHDL: 108.44
TRIGLYCERIDES: 105 mg/dL (ref 0.0–149.0)
Total CHOL/HDL Ratio: 3
VLDL: 21 mg/dL (ref 0.0–40.0)

## 2015-05-31 LAB — CBC WITH DIFFERENTIAL/PLATELET
BASOS PCT: 0.3 % (ref 0.0–3.0)
Basophils Absolute: 0 10*3/uL (ref 0.0–0.1)
EOS PCT: 1.3 % (ref 0.0–5.0)
Eosinophils Absolute: 0.1 10*3/uL (ref 0.0–0.7)
HCT: 39.9 % (ref 36.0–46.0)
Hemoglobin: 13.4 g/dL (ref 12.0–15.0)
LYMPHS ABS: 1.2 10*3/uL (ref 0.7–4.0)
Lymphocytes Relative: 20.1 % (ref 12.0–46.0)
MCHC: 33.6 g/dL (ref 30.0–36.0)
MCV: 90 fl (ref 78.0–100.0)
MONO ABS: 0.5 10*3/uL (ref 0.1–1.0)
Monocytes Relative: 8.5 % (ref 3.0–12.0)
NEUTROS ABS: 4.2 10*3/uL (ref 1.4–7.7)
NEUTROS PCT: 69.8 % (ref 43.0–77.0)
PLATELETS: 181 10*3/uL (ref 150.0–400.0)
RBC: 4.43 Mil/uL (ref 3.87–5.11)
RDW: 13.6 % (ref 11.5–15.5)
WBC: 6 10*3/uL (ref 4.0–10.5)

## 2015-05-31 NOTE — Progress Notes (Signed)
Subjective:    Patient ID: Amy Amy Lane, female    DOB: 11/23/1950, 65 y.o.   MRN: [id number]  HPI 65 y/o WF here for a follow up visit... she has mult med problems as noted below... she is followed regularly by Drs Amy Amy Lane for Cardiology, and Drs Amy Amy Lane for GI... ~  SEE PREV EPIC NOTES FOR OLDER DATA >>    LABS 4/14:  FLP- not at goals on diet alone but intol to all meds;  Chems- wnl;  CBC- wnl;  TSH=3.10;  VitD=19 & Rec VitD OTC supplement ~2000u daily...  April 11, 2013:  Amy Amy Lane was [facility] 1/6 - 01/30/13 by [facility] w/ HA & altered mental status- ?TIA vs complex migraine- CT Head was neg; CDopplers were ok; 2DEcho showed norm LVF, myxomatous MV s/p repair no resid MR; Coumadin was continued and ASA81 was added... She had ROV Amy Amy Lane 2/15> he felt the episode was a migraine equiv & no recurrent prob since then, she notes mild memory impairment w/ MMSE= 29/30... She notes that she's been under a lot of stress w/ father-in-law illness, husb w/ prostate ca, sold house & had to move...   CXR 01/28/13 showed norm heart size, pacemeaker & sternal wires, hyperinflation and asymmetric biapical pleural thickening L>R, NAD...  2DEcho 01/28/13 showed norm LV size & function w/ EF=55-60%, myxomatous MV s/p MVrepair & no resid MR, mild LA dil...  LABS 4/15:  FLP- at goals on Zetia10;  Chems- wnl;  CBC- wnl;  TSH=4.99...  ~  April 06, 2014:  37moRBainbridgehas been under some stress- [facility] (Amy Amy Lane [facility] ([place] he is currently improved & [place]-time job... Amy Amy Lane's CC today is some continued discomfort (pain/ swelling) in her hands & this was only min better w/ OTC NSAIDs; we discussed trial of Tramadol50, basic lab eval & consider referral to Rheumatology for further eval... She is also concerned about an 8# wt gain to 142lbs (BMI is still  only 20)... We reviewed the following medical problems during today's office visit >>     AR> on Benedryl OTC & Nasonex prn; breathing is Amy Lane w/o cough, sput, SOB, etc...    Hx Dysautonomia (POTS) & Ehlers-Danlos syndrome> followed by Amy Amy Lane Amy Amy Lane; BP= 110/66 w/o postural change, on Eliquis5Bid...    MVP, Myxomatous valve- s/p MVrepair (in [place])> on Eliquis5Bid; she is followed by Amy Amy Lane- seen 3/15 & stable pacer & no changes made...    Hx Tachy-brady, Fib-flutter w/ ablation & pacer> on Sectral200/d, & Eliquis5Bid; she does her own Protimes at home & called to the CC for adjustments...     Cerebrovasc Dis> on Eliquis5Bid, [facility] left [facility] stenosis w/ ulceration, & f/u CDopplers all ok...    ?DVT & ?PE while on Coumadin (during trip to [place])> she [place]    CHOL> on Zetia10 since 1/15 Hosp where LDL was 155; FLP on Zetia10 3/16 shows TChol 167, TG 66, HDL 61, LDL 93; she is intol to all other meds...    Hypothy> on Synthroid75; lab 3/15 shows TSH= 4.99; continue same meds, take pill first thing in morn, etc...    Underweight> BMI in the 19-20 range & we discussed nutritional supplements etc...Marland Kitchen  GI- Constip & Hems> on Align; c/o constip & asked to take Miralax, Senakot-S, added fiber; last colon 2009 by Amy Amy Lane w/ hems only & f/u planned 52yr...    GYN- kraurosis vulvae (DIV)> local GYN, [facility]va [facility] treatment & she is up to date on screening; Hx enterococcus UTI...    Migraine HAs & ?TIA> she is followed by Amy Amy Lane; Hosp 1/15 w/ another episode, symptoms cleared, ASA81 added to her Coumadin which was later changed to Eliquis5Bid; she had c/o some leg weakness but EMG/NCV was normal & rec to try PT...    Anxiety/ Depression> on Ativan 11mprn & started on Celexa20 which she really thinks is helping...  We reviewed prob list, meds, xrays and labs> see below for updates >>    LABS 3/16:  FLP- at goals on Zetia10;  Chems- wnl;  CBC- wnl;  TSH=3.24   Sed=15;  Anti-CCP=neg PLAN>> we decided to try warm soaks for hands and Tramadol50 prn; we will refer to Rheum for their opinion; continue other meds the same + her nutritional supplements etc...  ~  November 30, 2014:  77m69moV & PatFraser [place] a Amy Lane interval overall- she had some constipation, went to the ER (given BE prep)/ then on to see Amy Amy Lane in GI & started on LINGBEEFEO712ich is working very well for her;  We reviewed the following medical problems during today's office visit >>     AR> on Benedryl OTC & Nasonex prn; breathing is Amy Lane w/o cough, sput, SOB, etc...    Hx Dysautonomia (POTS) & Ehlers-Danlos syndrome> followed by DrNishan/Amy Amy Lane & DrHawkes (seen 4/16 w/ hand pain/weakness, polyarthralgia, OA); BP= 118/70 w/o postural change, on Eliquis5Bid...    MVP, Myxomatous valve- s/p MVrepair (in [place])> on Eliquis5Bid; she is followed by Amy Amy Lane- seen 4/16 & stable pacer & no changes made...    Hx Tachy-brady, Fib-flutter w/ ablation & pacer> on Sectral200/d, & Eliquis5Bid; she prev did her own Protimes at home & called to the CC for adjustments=> changed to Eliquis5Bid.    Cerebrovasc Dis> on Eliquis5Bid, s/p left CAE 2005 by [facility] asymptomatic left carotid stenosis w/ ulceration, & f/u CDopplers all ok...    ?DVT & ?PE while on Coumadin (during trip to [place])> she then [place]    CHOL> on Zetia10 since 1/15 Hosp where LDL was 155; FLP on Zetia10 3/16 shows TChol 167, TG 66, HDL 61, LDL 93; she is intol to all other meds...    Hypothy> on Synthroid75; lab 3/16 shows TSH= 3.24; continue same meds, take pill first thing in morn, etc...    Underweight> BMI in the 19-20 range & we discussed nutritional supplements etc...     GI- Constip & Hems> on Align; c/o constip & now improved on Linzess per Amy Amy Lane; last colon 2009 by Amy Amy Lane w/ hems only & f/u planned  10y21yr    GYN- kraurosis vulvae (DIV)> local GYN, [facility] treatment & she is up to date on screening; Hx enterococcus UTI...    Migraine HAs & ?TIA> [facility]; Hosp 1/15 w/ another episode, symptoms cleared, ASA81 added to her Coumadin which was later changed to Eliquis5Bid; she had c/o some leg weakness but EMG/NCV was normal & rec to try PT...    Anxiety/ Depression> on Ativan 1mg 75m & Celexa20 which she really thinks is helping...  EXAM shows Afeb, VSS, O2sat=98% on RA;  Marfanoid habitus;  HEENT- neg,  mallampati2;  Chest- clear w/o w/r/r;  Heart- RR w/o m/r/g;  Abd- soft, nontender;  Ext- neg w/o c/c/e;  Neuro- intact.. IMP/PLAN>>  OK Flu shot today;  Meds refilled- continue same and ROV follow up in 50mo..  ~  May 31, 2015:  647moOV & Amy Dinas had a lot going on neurologically in the interval>  She fell at home w/ fx bones in left foot- eval & cast by DrHewitt, just got the cast off to boot now; she had f/u Amy Amy Lane w/ mild cognitive impairment & he started Aricept5=>10 & pt feels much better, memory improved, not getting lost driving, etc;  Also says she was told "prob MS" (falls, weakness, bladder, etc) but they can't do MRI due to her pacer;  She has a great attitude, feeling well today, no prob w/ meds, due for CXR & FASTING blood work today... Given PREVNAR-13 today as well.    AR> on Benedryl OTC & Nasonex prn; breathing is Amy Lane w/o cough, sput, SOB, etc; doing OK even w/ the pollen...    Hx Dysautonomia (POTS) & Ehlers-Danlos syndrome> followed by DrNishan/Amy Amy Lane & DrHawkes (seen 4/16 w/ hand pain/weakness, polyarthralgia, OA- we do not have more recent note); BP= 118/70 no postural change, on Eliquis5Bid.    MVP, Myxomatous valve- s/p MVrepair (in [place])> on Eliquis5Bid; she is followed by Amy Amy Lane- seen 4/16 & stable pacer & no changes made...    Hx Tachy-brady, Fib-flutter w/ ablation & pacer> on Sectral200/d, & Eliquis5Bid; she prev did  her own Protimes at home & changed to Eliquis5Bid...    Cerebrovasc Dis> on Eliquis5Bid, s/p [facility] left [facility] stenosis w/ ulceration, & f/u CDopplers all ok...    ?DVT & ?PE while on Coumadin (during trip to [place])> she then [place] eliqu[place]5Bid & stable...    CHOL> on Zetia10 since 1/15 Hosp where LDL was 155; FLP on Zetia10 5/17 shows TChol 168, TG 105, HDL 60, LDL 87; she is intol to all other meds...    Hypothy> on Synthroid75; lab 5/17 shows TSH= 2.08; continue same meds, take pill first thing in morn, etc...    Underweight> BMI in the 19-20 range & we discussed nutritional supplements etc=> wt up to 142#...     GI- Constip & Hems> on Align; c/o constip & now improved on Linzess per Amy Amy Lane; last colon 2009 by Amy Amy Lane w/ hems only & f/u planned 1067yr.    GYN- kraurosis vulvae (DIV)> local GYN, [facility]va [facility] treatment & she is up to date on screening; Hx enterococcus UTI...    Migraine HAs & ?TIA, Mild cognitive impairment, ?MS> she is followed by [facility]- Amy Amy Lane; Hosp 1/15 w/ another episode, symptoms cleared, ASA81 added to her Coumadin which was later changed to Eliquis5Bid; she had c/o some leg weakness but EMG/NCV was normal & rec to try PT;  She reports [facility] on Aricept & improved memory she says; Amy Amy Lane indicated that he thinks she has MS but they can't get MRI w/ her pacer...    Anxiety/ Depression> on Ativan 1mg47mn & Celexa20 which she really thinks is helping...  EXAM shows Afeb, VSS, O2sat=98% on RA;  Marfanoid habitus;  HEENT- neg, mallampati2;  Chest- clear w/o w/r/r;  Heart- RR w/o m/r/g;  Abd- soft, nontender;  Ext- neg w/o c/c/e;  Neuro- intact..  CXR 05/31/15>  Stable- norm heart size, s/p median sternotomy & MVR, pacer in situ, hyperinflated appearance  suggests underlying COPD, scarring left apex, NAD...  LABS 05/31/15>  FLP- at goals on Zetia10;  Chems- wnl;  CBC- wnl;   TSH=2.08;   IMP/PLAN>>  Amy Amy Lane, she has gained a few lbs eating ice cream Qhs, yet her Lipids remain in Amy Lane control, Labs normal, CXR clear; she is planning a [place] cruise this summer w/ a nurse friend; stable on current med rx- continue same, given PREVNAR-13 today...           Problem List:      ALLERGIC RHINITIS (ICD-477.9) - we discussed Rx w/ Zyrtek, Astepro Prn, etc... ~  CXR 3/11 showed left pacer, sternal wires, NAD.Marland Kitchen. ~  CXR 6/13 showed stable post-op changes and pacer, pulm hyperinflation & scattered scarringt, calcif mediastinal nodes, no focal opacities, NAD.Marland Kitchen. ~  CXR 1/15 showed norm heart size, post op changes & pacer, no adenop, no edema, clear x some left apical pleural thickening w/o change...  MITRAL VALVE PROLAPSE (ICD-424.0) - followed by Amy Amy Lane... ~  2DEcho 11/08 showed myxomatous MV, annuloplasty ring, decr post leaflet excursion, norm LVH & wall motion... ~  NuclearStressTest 11/08 showed mild apical thinning... prev study 12/04 w/o ischemia or infarct & EF=68%... ~  she sees [place] every 6 months- for f/u MVP (s/p MV repair at [facility]), dysautonomia (s/p pacer), & Ehlers-Danlos Syndrome> last seen 1/15 & note reviewed, EKG w/ irreg rhythm, borderline QT interval... ~  2DEcho 1/15> cavity size was normal, wall thickness was normal w/ norm systolic function was normal & EF= 55% to 60%, myxomatous MV s/p repair with no residual MR, LA is mildly dilated.  Hx of BRADYCARDIA-TACHYCARDIA SYNDROME (ICD-427.81) - on SECTRAL 238m/d & COUMADIN via CC... hx AFib/Flutter w/ ablation & pacemaker placed ... she has Ehlers-Danlos synd w/ prolonged QT interval...  CARDIAC PACEMAKER IN SITU (ICD-V45.01) - f/u Amy Amy Lane w/ CTChest 4/09- neg. ~  dual chamber pacer changed 3/11 by Amy Amy Lane for end-of-life... ~  EKG shows NSR, 1st degree AVB, RBBB... ~  3/15: she had f/u Amy Amy Lane> note reviewed, pacer functioning well- no changes made but he wanted her to consider a  NOAC...  DYSAUTONOMIA (ICD-742.8) - prev on Proamatine 568mtabs per Amy Amy Lane, but this was stopped...  CEREBROVASCULAR DISEASE (ICD-437.9) - on ASA 8139m & COUMADIN (followed in the clinic)... ~  she [facility] left carotid stenosis w/ ulceration... ~  Carotid angiogram 5/08 by [facility] <30% right carotid stenosis ~  f/u CTA of Head & Neck 12/09 was neg= norm CTA head, & no signif carotid dis noted in neck. ~  CDoppler 2/11 showed stable mild carotid dis, left CAE w/ DPA is patent, 0-39% bilat ICA stenoses. ~  repear CDopplers 11/11 per DrNCherly Hensenowed smooth plaque in right bulb & distal left CCA- stable; 0-39% bilat ICA stenoses... ~  CT Head 6/13 is neg- No acute intracranial abnormalities... ~  CTA Brain 6/13 showed mild atherosclerotic dis in the carotid siphon regions bilat w/o stenoses, otherw norm CTA brain w/o lesions... ~  CT Angio Neck 6/13 via ER showed mild nonstenotic arteriosclerotic dis at both carotid bifurcations w/o signif stenoses... ~  CDopplers 11/13 showed mild soft smooth plaque w/ 0-39% right ICAstenosis & 40-59% left ICA stenosis, vertebrals are antegrade... ~  CDopplers 1/15 showed 1-39% internal carotid artery stenosis bilaterally & antegrade vertebral flow... ~  CDopplers 2/16 showed mixed plaque bilat, stable 1-39% bilat ICAstenoses w/ left CEA & DPA, norm subclav & patent vertebrals...  ?DVT &  poss PTE while on vacation in [place] during the summer of 2012 >> pt was on Coumadin & INR was therapeutic; she had pain & swelling in left leg w/ a pos doppler reported by a [place] physician; she was also SOB & they questioned poss PTE but didn't get scan or further eval;  On return to [place] DrNishan wondered if she might have bled into the leg muscles & sent her to DrNorris for Ortho eval; he could not corroborate the theory & treated her w/ compression stockings & she gradually improved towards her baseline...  HYPERCHOLESTEROLEMIA  (ICD-272.0) - on diet alone now, prev on Lip20- this was stopped 1/12 by Amy Amy Lane & she feels much better off this med! ~  [place] 05/13/07 on Lip20 showed TChol 119, TG 50, HDL 55, LDL 54 ~  FLP 5/10 on Lip20 showed TChol 120, TG 43, HDL 58, LDL 54 ~  FLP 9/11 on Lip20 showed TChol 130, TG 54, HDL 59, LDL 60 ~  FLP 5/12 on diet alone showed TChol 218, TG 91, HDL 57, LDL 135... Continue low chol, low fat diet. ~  FLP 10/12 showed TChol 231, TG 86, HDL 68, LDL 151... I rec the Lipid CLinic, & they started NIACIN 576m/d. ~  FLP 2/13 on Niacin500 showed TChol 199, TG 55, HDL 76, LDL 112... Much improved, continue same. ~  FLP 11/13 on diet alone showed TChol 231, TG 49, HDL 67, LDL 151..,. rec refer to Lipid clinic. ~  FGrand Marsh4/14 on diet + Niacin showed TChol 233, TG 78, HDL 75, LDL 146  ~  FLP 1/15 in hosp showed Tchol 235, TG 79, HDL 64, LDL 155 => ZETIA 114md started... ~  FLM[place]/15 on Zetia10 showed TChol 172, TG 74, HDL 59, LDL 89... Continue same... ~  [place]/16 on Zetia10 showed TChol 167, TG 66, HDL 61, LDL 93  HYPOTHYROIDISM (ICD-244.9) - currently on SYNTHROID 7553md... ~  labs 4/09 on Levoth88 showed TSH = 0.10... rec to decr Synthroid to 53m55m... ~  labs 9/09 on Levoth75 showed TSH = 1.67 ~  labs 5/10 on Levoth75 showed TSH = 2.92... Continue same. ~  labs 9/11 on Levoth75 showed TSH= 1.37 ~  Labs 5/12 on Levothy75 showed TSH= 4.33 ~  Labs 10/12 on Levothy75 showed TSH= 1.51... Continue same. ~  Labs 11/13 on Levothy75 showed TSH= 1.55 ~  Labs 4/14 on Levothy75 showed TSH= 3.10 ~  Labs 3/15 on Levothy75 showed TSH= 4.99... Reminded to take every AM on empty stomach etc. ~  Labs 3/16 showed TSH= 3.24  HEMORRHOIDS (ICD-455.6) - last colonoscopy 2/09 by Amy Amy Lane showed only sm hems... f/u [id number] - followed by DrMcPhail and Dx w/ kraurosis vulvae 7/10... Prev on Premarin VagCream. ~  11/10: she discussed the Dx w/ me & will inquire about poss hyst/ BSO... ~  4/11:  [facility] Erin Illene Bolused vulvar dermatitis & dryness w/ rec for "Crisco" Tid! 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 ~  She saw DrNorris 9/14 w/ neck pain> CT CSpine showed degen disc dis & he rec PT... ~  3/16: she is c/o persistent bilat hand pain & swelling, not much better w/ OTC NSAIDs; we decided to check Anti-CCP (neg) & Sed (  15); we will Rx w/ Tramadol50Tid prn, hot soaks, & refer to Rheum for further eval...  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  Hx of TRANSIENT ISCHEMIC ATTACK (ICD-435.9) - on ASA 25m/d & COUMADIN> she has been followed by Amy Amy Lane; [facility]  1/12: f/u eval by Amy Amy Lane reviewed... ~  6/13: she presented w/ ?TIA symptoms & eval was neg; review by Amy Amy Lane indicated prob Migraine & everything resolved... ~  1/15: she had another episode, Hosp by [facility] x2d but felt to prob be Migraine related... ~  11/15: she had f/u appt w/ Amy Amy Lane> Hx Ehlers-Danlos, Migraines, & c/o some leg weakness (some difficulty rising from squat); he did EMG/NCV (neg-no myopathy, no neuropathy) & rec phys therapy  Hx of SYNCOPE (ICD-780.2) - felt to be neurally mediated, ? related to long QT, no recur since pacer placed...  MIGRAINE HEADACHE >> she presented w/ ?TIA symptoms 6/13 & eval was neg; review by Amy Amy Lane indicated prob Migraine & everything resolved, no recurrence... ~  Seen by Neuro 2/15 Amy Amy Lane w/ migraines, memory deficit, abn of gait;   DEPRESSION (ICD-311) - she takes LORAZEPAM 129mhs for insomnia... she [place]Gra[place] ~  11/10: poor appetite & some wt loss prob related to depression & we discussed trial Lexapro 1058m... ~  3/11: she is  very pleased- feels better, gained 5# w/ Ensure, etc... ~  9/11:  "I feel great" & Lexapro continued... ~  She was able to stop the Lexapro 1/12 (also stopped the Lipitor) & has done well off it ever since... ~  10/13:  She called w/ situational depression due to marital stress, getting counseling, etc; started PROZAC 27m46m& she reports much improved... ~  2015: she is off the Prozac and using Ativan 1mg 74m... ~  9/15: on Ativan1mg p40mand CELEXA20 called in several mo ago & she is much improved...  CARCINOMA, BASAL CELL (ICD-173.9) - skin cancer [facility] left [facility] ~  12/11:  She reports bx of rash on legs= lichen planus per DrHall, treated w/ topical ointment... ~  2/13:  She had a skin lesion removed from her neck= basal cell ca & wide excision performed by DrLupton.  Health Maintenance: ~  GI:  followed by Amy Amy Lane w/ colonoscopy 2/09 showing only sm hems... ~  GYN:  followed by DrMcPhail, and Dr. Erin CIllene BolusC[facility]... ~  Labs 9/11 showed Vit D level = 39 & rec to start 1000 u OTC supplement... ~  Immunizations:  she had TDAP 3/11... she gets yearly Flu vaccine in the Fall of the yr...    Past Surgical History  Procedure Laterality Date  . Complex mitral valve repair      [facility]  [facility]    by Dr. G. HayDelton Seecemaker insertion      medtronic kappa [phone]  . Removed chalazion from left eye lid  09/2011    Dr. TannerSatira Sarktpatient Encounter Prescriptions as of 05/31/2015  Medication Sig  . acebutolol (SECTRAL) 200 MG capsule TAKE 1 CAPSULE (200 MG TOTAL) BY MOUTH AT BEDTIME.  . citalopram (CELEXA) 20 MG tablet Take 1 tablet (20 mg total) by mouth daily.  . doneMarland Kitchenezil (ARICEPT) 10 MG tablet TAKE 1 TABLET BY MOUTH AT BEDTIME  . ELIQUIS 5 MG TABS tablet TAKE 1 TABLET (5 MG TOTAL) BY MOUTH 2 (TWO) TIMES DAILY.  .Marland Kitchen  ezetimibe (ZETIA) 10 MG tablet TAKE 1 TABLET (10 MG TOTAL) BY MOUTH DAILY.  Marland Kitchen levothyroxine  (SYNTHROID, LEVOTHROID) 75 MCG tablet TAKE 1 TABLET BY MOUTH EVERY DAY  . Linaclotide (LINZESS) 145 MCG CAPS capsule Take 1 capsule (145 mcg total) by mouth daily.  . Methylcellulose, Laxative, (CITRUCEL PO) Take by mouth daily.  . Probiotic Product (ALIGN) 4 MG CAPS Take 4 mg by mouth every morning.  . traMADol (ULTRAM) 50 MG tablet Take 1 tablet (50 mg total) by mouth 3 (three) times daily as needed for moderate pain.  . [DISCONTINUED] LORazepam (ATIVAN) 1 MG tablet TAKE 1 TABLET BY MOUTH 3 TIMES A DAY AS NEEDED FOR ANXIETY  . [DISCONTINUED] levothyroxine (SYNTHROID, LEVOTHROID) 75 MCG tablet TAKE 1 TABLET BY MOUTH EVERY DAY  . [DISCONTINUED] oseltamivir (TAMIFLU) 75 MG capsule Take 1 capsule (75 mg total) by mouth 2 (two) times daily.   No facility-administered encounter medications on file as of 05/31/2015.    Allergies  Allergen Reactions  . Other Other (See Comments)    All QTc prolongation drugs  . Statins Other (See Comments)    myalgias    Current Medications, Allergies, Past Medical History, Past Surgical History, Family History, and Social History were reviewed in [employer] record.    Review of Systems         See HPI - all other systems neg except as noted... The patient complains of dyspnea on exertion.  The patient denies anorexia, fever, weight loss, weight gain, vision loss, decreased hearing, hoarseness, chest pain, syncope, peripheral edema, prolonged cough, headaches, hemoptysis, abdominal pain, melena, hematochezia, severe indigestion/heartburn, hematuria, incontinence, muscle weakness, suspicious skin lesions, transient blindness, difficulty walking, depression, unusual weight change, abnormal bleeding, enlarged lymph nodes, and angioedema.     Objective:   Physical Exam     WD, Thin, 65 y/o WF in NAD... she is 5'11"Tall and 141# = BMI~20... Vital Signs:  Reviewed... GENERAL:  Alert & oriented; pleasant & cooperative... HEENT:  [place]/AT,  EOM-wnl, PERRLA, EACs-clear, TMs-wnl, NOSE-clear, THROAT-clear & wnl. NECK:  Supple w/ fair ROM; no JVD; normal carotid impulses w/o bruits, left CAE scar; no thyromegaly or nodules palpated; no lymphadenopathy. CHEST:  Clear to P & A; without wheezes/ rales/ or rhonchi heard... HEART:  Regular Rhythm; pacer on left, without murmurs/ rubs/ or gallops detected... ABDOMEN:  Soft & nontender; normal bowel sounds; no organomegaly or masses palpated... EXT: without deformities or arthritic changes; no varicose veins/ venous insuffic/ or edema. NEURO:  CN's intact;  no focal neuro deficits... DERM:  s/p skin cancer removed from left leg; min rash noted...  RADIOLOGY DATA:  Reviewed in the EPIC EMR & discussed w/ the patient...  LABORATORY DATA:  Reviewed in the EPIC EMR & discussed w/ the patient...   Assessment & Plan:    MVP/ MV Repair> Tachy-Brady/ PACER> Dysautonomia>  Followed by Amy Amy Lane & Amy Amy Lane, doing well on Eliquis & Sectral, continue same...  Cerebrovasc Dis>  Off ASA now that she's on Eliquis, s/p left CAE 11/05, no recurrent cerebral ischemic symptoms & CDopplers stable...  ?DVT while in [place]> moot issue at present; prev on Coumadin now Eliquis; leg swelling diminished on the compression hose, no salt, elevation, etc; she is back to her exercise program & back to baseline...  CHOL>  She feels much better off the Lipitor & FLP improved w/ Zetia10 started 1/15 [facility]- continue same...  Hypothyroid>  Stable on the Synthroid 70mg/d...  GYN>  Stable & followed both here &  at [facility] (see above)...  EHLERS-DANLOS>  Aware, stable, she has been feeling better & in great spirits...  Migraines, ?TIA, ?MS, mild cognitive impairment> [facility] 6/13 & 1/15 w/ ?TIA but Amy Amy Lane thought likely Migraine syndrome- resolved, neg work up, no recurrence & they are following... Neuro> she mentions several symtoms that she is concerned about- clumsy, balance off, subjective leg weakness- & she is  concerned; therefore refer to Neurology for eval... 5/17> she indicates that Amy Amy Lane Dx mild cognitive impairment & treated w/ Aricept10 & she feels much better she says  Depression>  Off Prozac20 & on Celexa20 & Ativan prn; she & husb received counseling...  Other medical problems as noted...   Patient's Medications  New Prescriptions   No medications on file  Previous Medications   ACEBUTOLOL (SECTRAL) 200 MG CAPSULE    TAKE 1 CAPSULE (200 MG TOTAL) BY MOUTH AT BEDTIME.   CITALOPRAM (CELEXA) 20 MG TABLET    Take 1 tablet (20 mg total) by mouth daily.   DONEPEZIL (ARICEPT) 10 MG TABLET    TAKE 1 TABLET BY MOUTH AT BEDTIME   ELIQUIS 5 MG TABS TABLET    TAKE 1 TABLET (5 MG TOTAL) BY MOUTH 2 (TWO) TIMES DAILY.   EZETIMIBE (ZETIA) 10 MG TABLET    TAKE 1 TABLET (10 MG TOTAL) BY MOUTH DAILY.   LEVOTHYROXINE (SYNTHROID, LEVOTHROID) 75 MCG TABLET    TAKE 1 TABLET BY MOUTH EVERY DAY   LINACLOTIDE (LINZESS) 145 MCG CAPS CAPSULE    Take 1 capsule (145 mcg total) by mouth daily.   METHYLCELLULOSE, LAXATIVE, (CITRUCEL PO)    Take by mouth daily.   PROBIOTIC PRODUCT (ALIGN) 4 MG CAPS    Take 4 mg by mouth every morning.   TRAMADOL (ULTRAM) 50 MG TABLET    Take 1 tablet (50 mg total) by mouth 3 (three) times daily as needed for moderate pain.  Modified Medications   Modified Medication Previous Medication   LORAZEPAM (ATIVAN) 1 MG TABLET LORazepam (ATIVAN) 1 MG tablet      TAKE 1 TABLET BY MOUTH 3 TIMES A DAY AS NEEDED    TAKE 1 TABLET BY MOUTH 3 TIMES A DAY AS NEEDED FOR ANXIETY  Discontinued Medications   LEVOTHYROXINE (SYNTHROID, LEVOTHROID) 75 MCG TABLET    TAKE 1 TABLET BY MOUTH EVERY DAY   OSELTAMIVIR (TAMIFLU) 75 MG CAPSULE    Take 1 capsule (75 mg total) by mouth 2 (two) times daily.

## 2015-05-31 NOTE — Patient Instructions (Signed)
Today we updated your med list in our EPIC system...    Continue your current medications the same...  Today we checked a follow up CXR & your FASTING blood work...    We will contact you w/ the results when available...   We also gave you the PREVNAR-13 pneumonia vaccination (one & done)...   Call for any questions...  Let's plan a follow up visit in 66mo, sooner if needed for problems.Marland KitchenMarland Kitchen

## 2015-06-02 NOTE — Progress Notes (Signed)
Quick Note:  Called and spoke to pt. Informed her of the results and recs per SN. Pt verbalized understanding and denied any further questions or concerns at this time. ______ 

## 2015-06-04 ENCOUNTER — Other Ambulatory Visit: Payer: Self-pay

## 2015-06-04 MED ORDER — DONEPEZIL HCL 10 MG PO TABS: 10.0000 mg | ORAL_TABLET | Freq: Every day | ORAL | Status: DC

## 2015-06-04 NOTE — Telephone Encounter (Signed)
90 day refill retailed as requested.

## 2015-06-13 ENCOUNTER — Other Ambulatory Visit: Payer: Self-pay | Admitting: Pulmonary Disease

## 2015-06-15 ENCOUNTER — Encounter: Payer: Self-pay | Admitting: Neurology

## 2015-06-15 ENCOUNTER — Ambulatory Visit (INDEPENDENT_AMBULATORY_CARE_PROVIDER_SITE_OTHER): Payer: PRIVATE HEALTH INSURANCE | Admitting: Neurology

## 2015-06-15 VITALS — BP 99/61 | HR 66 | Resp 16 | Ht 70.0 in | Wt 140.0 lb

## 2015-06-15 DIAGNOSIS — G43001 Migraine without aura, not intractable, with status migrainosus: Secondary | ICD-10-CM

## 2015-06-15 DIAGNOSIS — R269 Unspecified abnormalities of gait and mobility: Secondary | ICD-10-CM

## 2015-06-15 DIAGNOSIS — R413 Other amnesia: Secondary | ICD-10-CM | POA: Diagnosis not present

## 2015-06-15 DIAGNOSIS — N393 Stress incontinence (female) (male): Secondary | ICD-10-CM

## 2015-06-15 DIAGNOSIS — Q796 Ehlers-Danlos syndrome, unspecified: Secondary | ICD-10-CM

## 2015-06-15 DIAGNOSIS — G451 Carotid artery syndrome (hemispheric): Secondary | ICD-10-CM

## 2015-06-15 MED ORDER — DONEPEZIL HCL 10 MG PO TABS: 5.0000 mg | ORAL_TABLET | Freq: Every day | ORAL | Status: DC

## 2015-06-15 NOTE — Patient Instructions (Signed)
Fall Prevention in the Home  Falls can cause injuries and can affect people from all age groups. There are many simple things that you can do to make your home safe and to help prevent falls. WHAT CAN I DO ON THE OUTSIDE OF MY HOME?  Regularly repair the edges of walkways and driveways and fix any cracks.  Remove high doorway thresholds.  Trim any shrubbery on the main path into your home.  Use bright outdoor lighting.  Clear walkways of debris and clutter, including tools and rocks.  Regularly check that handrails are securely fastened and in good repair. Both sides of any steps should have handrails.  Install guardrails along the edges of any raised decks or porches.  Have leaves, snow, and ice cleared regularly.  Use sand or salt on walkways during winter months.  In the garage, clean up any spills right away, including grease or oil spills. WHAT CAN I DO IN THE BATHROOM?  Use night lights.  Install grab bars by the toilet and in the tub and shower. Do not use towel bars as grab bars.  Use non-skid mats or decals on the floor of the tub or shower.  If you need to sit down while you are in the shower, use a plastic, non-slip stool..  Keep the floor dry. Immediately clean up any water that spills on the floor.  Remove soap buildup in the tub or shower on a regular basis.  Attach bath mats securely with double-sided non-slip rug tape.  Remove throw rugs and other tripping hazards from the floor. WHAT CAN I DO IN THE BEDROOM?  Use night lights.  Make sure that a bedside light is easy to reach.  Do not use oversized bedding that drapes onto the floor.  Have a firm chair that has side arms to use for getting dressed.  Remove throw rugs and other tripping hazards from the floor. WHAT CAN I DO IN THE KITCHEN?   Clean up any spills right away.  Avoid walking on wet floors.  Place frequently used items in easy-to-reach places.  If you need to reach for something  above you, use a sturdy step stool that has a grab bar.  Keep electrical cables out of the way.  Do not use floor polish or wax that makes floors slippery. If you have to use wax, make sure that it is non-skid floor wax.  Remove throw rugs and other tripping hazards from the floor. WHAT CAN I DO IN THE STAIRWAYS?  Do not leave any items on the stairs.  Make sure that there are handrails on both sides of the stairs. Fix handrails that are broken or loose. Make sure that handrails are as long as the stairways.  Check any carpeting to make sure that it is firmly attached to the stairs. Fix any carpet that is loose or worn.  Avoid having throw rugs at the top or bottom of stairways, or secure the rugs with carpet tape to prevent them from moving.  Make sure that you have a light switch at the top of the stairs and the bottom of the stairs. If you do not have them, have them installed. WHAT ARE SOME OTHER FALL PREVENTION TIPS?  Wear closed-toe shoes that fit well and support your feet. Wear shoes that have rubber soles or low heels.  When you use a stepladder, make sure that it is completely opened and that the sides are firmly locked. Have someone hold the ladder while you   are using it. Do not climb a closed stepladder.  Add color or contrast paint or tape to grab bars and handrails in your home. Place contrasting color strips on the first and last steps.  Use mobility aids as needed, such as canes, walkers, scooters, and crutches.  Turn on lights if it is dark. Replace any light bulbs that burn out.  Set up furniture so that there are clear paths. Keep the furniture in the same spot.  Fix any uneven floor surfaces.  Choose a carpet design that does not hide the edge of steps of a stairway.  Be aware of any and all pets.  Review your medicines with your healthcare provider. Some medicines can cause dizziness or changes in blood pressure, which increase your risk of falling. Talk  with your health care provider about other ways that you can decrease your risk of falls. This may include working with a physical therapist or trainer to improve your strength, balance, and endurance.   This information is not intended to replace advice given to you by your health care provider. Make sure you discuss any questions you have with your health care provider.   Document Released: 12/30/2001 Document Revised: 05/26/2014 Document Reviewed: 02/13/2014 Elsevier Interactive Patient Education 2016 Elsevier Inc.  

## 2015-06-15 NOTE — Progress Notes (Signed)
Reason for visit: Migraine  Amy Lane is an 65 y.o. female  History of present illness:  Amy Lane is a 65 year old right-handed white female with a history of Ehlers-Danlos syndrome and a history of migraine headache. The patient reports a gradual change in memory over time. She believes that on Aricept she has improved her memory somewhat, but she still has some issues with directions while driving, and some word finding problems. The patient is having night sweats on the medication, she has increased irritability, and some diarrhea as long she is taking the Linzess along with the Aricept. The patient reports some neck stiffness. She has had 2 episodes in the last 2 months of visual field loss to the left side, bumping into people on the left. The patient indicates that the episodes last less than 10 seconds, unassociated with any other symptoms such as headache, numbness, weakness, or speech changes. The patient has had an occasional migraine associated with confusion. She has visual disturbances associated with her migraine on a daily basis with flashing lights in the vision. She also reports some difficulty controlling the bladder as well as the bowels. She recently has had some falls, she has fallen on 3 occasions since last seen, she did fracture her left ankle, she is now out of a cast she will be getting physical therapy in the near future. The patient returns for an evaluation.  Past Medical History  Diagnosis Date  . Herpes zoster   . SVT (supraventricular tachycardia) ([place])   . PSVT (paroxysmal supraventricular tachycardia) ([place])   . PAT (paroxysmal atrial tachycardia) ([place])   . History of tachycardia-bradycardia syndrome   . Atrial fibrillation ([place])   . Dysautonomia   . History of syncope   . Cardiac pacemaker     DDD  MDT  . Long term (current) use of anticoagulants   . Allergic rhinitis   . Mitral valve prolapse   . Cerebrovascular disease   . Hypercholesteremia    . Hypothyroidism   . Hemorrhoids   . Ehler's-Danlos syndrome   . Fibromyalgia   . History of transient ischemic attack   . Depression   . Carcinomas, basal cell   . Skin desquamation     inflammative vaginitis  . Abnormal gait   . Hypercholesterolemia   . Headache(784.0)     Migraine    Past Surgical History  Procedure Laterality Date  . Complex mitral valve repair      at the [facility]  . Left carotid endarterectomy  11/2003    by Dr. Delton See  . Pacemaker insertion      medtronic kappa 901  . Removed chalazion from left eye lid  09/2011    Dr. Satira Sark    Family History  Problem Relation Age of Onset  . Leukemia Other     Nephew  . Lung cancer Mother   . Prostate cancer Father   . Alzheimer's disease Father   . Heart disease Father   . Mitral valve prolapse Brother   . Diabetes Sister   . Mitral valve prolapse Sister   . Heart disease      entire maternal side of family  . Cancer      entire maternal side of family  . Ehlers-Danlos syndrome Father     Social history:  reports that she has never smoked. She has never used smokeless tobacco. She reports that she does not drink alcohol or use illicit drugs.    Allergies  Allergen Reactions  . Other Other (See Comments)    All QTc prolongation drugs  . Statins Other (See Comments)    myalgias    Medications:  Prior to Admission medications   Medication Sig Start Date End Date Taking? Authorizing Provider  acebutolol (SECTRAL) 200 MG capsule TAKE 1 CAPSULE (200 MG TOTAL) BY MOUTH AT BEDTIME. 05/25/15  Yes Josue Hector, MD  citalopram (CELEXA) 20 MG tablet Take 1 tablet (20 mg total) by mouth daily. 03/10/15  Yes Noralee Space, MD  donepezil (ARICEPT) 10 MG tablet Take 0.5 tablets (5 mg total) by mouth at bedtime. 06/15/15  Yes Kathrynn Ducking, MD  ELIQUIS 5 MG TABS tablet TAKE 1 TABLET (5 MG TOTAL) BY MOUTH 2 (TWO) TIMES DAILY. 07/29/14  Yes Josue Hector, MD  ezetimibe (ZETIA) 10 MG tablet TAKE 1  TABLET (10 MG TOTAL) BY MOUTH DAILY. 11/30/14  Yes Noralee Space, MD  levothyroxine (SYNTHROID, LEVOTHROID) 75 MCG tablet TAKE 1 TABLET BY MOUTH EVERY DAY 06/14/15  Yes Noralee Space, MD  Linaclotide [facility]) 145 MCG CAPS capsule Take 1 capsule (145 mcg total) by mouth daily. 12/01/14  Yes Milus Banister, MD  LORazepam (ATIVAN) 1 MG tablet TAKE 1 TABLET BY MOUTH 3 TIMES A DAY AS NEEDED 05/31/15  Yes Noralee Space, MD  Probiotic Product (ALIGN) 4 MG CAPS Take 4 mg by mouth every morning.   Yes Historical Provider, MD  traMADol (ULTRAM) 50 MG tablet Take 1 tablet (50 mg total) by mouth 3 (three) times daily as needed for moderate pain. 11/30/14  Yes Noralee Space, MD    ROS:  Out of a complete 14 system review of symptoms, the patient complains only of the following symptoms, and all other reviewed systems are negative.  Decreased activity, excessive sweating Runny nose, drooling Light sensitivity, loss of vision Cough, choking Incontinence of bowels Incontinence of bladder Joint pain, walking difficulty, neck stiffness Bruising easily Weakness Hallucinations  Blood pressure 99/61, pulse 66, resp. rate 16, height 5\' 10"  (1.778 m), weight 140 lb (63.504 kg).  Physical Exam  General: The patient is alert and cooperative at the time of the examination.  Neck: No carotid bruits are noted. There appears to be significant decrease in range moving the neck.  Respiratory: Lung fields are clear.  Cardiovascular: Regular rate and rhythm, no murmurs or rubs are noted.  Skin: No significant peripheral edema is noted.   Neurologic Exam  Mental status: The patient is alert and oriented x 3 at the time of the examination. The patient has apparent normal recent and remote memory, with an apparently normal attention span and concentration ability. Mini-Mental Status Examination done today shows a total score of 29/30.   Cranial nerves: Facial symmetry is present. Speech is normal, no aphasia or  dysarthria is noted. Extraocular movements are full. Visual fields are full.  Motor: The patient has good strength in all 4 extremities.  Sensory examination: Soft touch sensation is symmetric on the face, arms, and legs.  Coordination: The patient has good finger-nose-finger and heel-to-shin bilaterally.  Gait and station: The patient has a normal gait. Tandem gait is was not tested. Romberg is negative. No drift is seen.  Reflexes: Deep tendon reflexes are symmetric.   Assessment/Plan:  1. Ehlers-Danlos syndrome  2. Gait disturbance  3. Transient visual loss, left homonymous  4. Incontinence of bowel and bladder  5. Memory disturbance  6. Migraine headache  The patient will be sent for  a carotid Doppler study given the transient episodes of left homonymous visual field changes. The patient has some urinary incontinence, I will refer the patient to [facility] physical therapy for [place] exercises. The patient may contact her gastroenterologist about getting on a 72 g dose of Linzess. This may help the diarrhea. The patient is having some irritability and night sweats on Aricept, she is also having vivid dreams. She will cut back to 5 mg on the dose, take the dose in the morning. She will follow-up in 6 months, sooner if needed. We will continue to follow the memory issues.  [name] MD 06/15/2015 7:51 PM  [facility] [address] [place], [place] [address]  Phone [phone] Fax [phone]

## 2015-06-16 ENCOUNTER — Telehealth: Payer: Self-pay | Admitting: Gastroenterology

## 2015-06-16 MED ORDER — LINACLOTIDE 72 MCG PO CAPS: 72.0000 ug | ORAL_CAPSULE | Freq: Every day | ORAL | Status: DC

## 2015-06-16 NOTE — Telephone Encounter (Signed)
prescription sent and pt has been notified

## 2015-06-16 NOTE — Telephone Encounter (Signed)
Dr Ardis Hughs please see Dr Jannifer Franklin note regarding decreasing the Linzess to 72 mcg.  Please advise

## 2015-06-16 NOTE — Telephone Encounter (Signed)
Sounds like a good idea.  Can you please call in the new dose, one pill once daily, 11 refills. thanks

## 2015-07-21 ENCOUNTER — Ambulatory Visit (INDEPENDENT_AMBULATORY_CARE_PROVIDER_SITE_OTHER): Payer: PRIVATE HEALTH INSURANCE

## 2015-07-21 DIAGNOSIS — R269 Unspecified abnormalities of gait and mobility: Secondary | ICD-10-CM

## 2015-07-21 DIAGNOSIS — G451 Carotid artery syndrome (hemispheric): Secondary | ICD-10-CM | POA: Diagnosis not present

## 2015-07-26 ENCOUNTER — Telehealth: Payer: Self-pay | Admitting: Neurology

## 2015-07-26 NOTE — Telephone Encounter (Signed)
I called patient. The carotid Doppler study is unremarkable.

## 2015-07-26 NOTE — Telephone Encounter (Signed)
I called the patient, the carotid Doppler study was unremarkable.

## 2015-08-15 ENCOUNTER — Other Ambulatory Visit: Payer: Self-pay | Admitting: Pulmonary Disease

## 2015-08-15 ENCOUNTER — Other Ambulatory Visit: Payer: Self-pay | Admitting: Cardiovascular Disease

## 2015-08-24 ENCOUNTER — Encounter: Payer: No Typology Code available for payment source | Admitting: Internal Medicine

## 2015-09-04 ENCOUNTER — Other Ambulatory Visit: Payer: Self-pay | Admitting: Pulmonary Disease

## 2015-09-06 ENCOUNTER — Encounter: Payer: Self-pay | Admitting: Cardiovascular Disease

## 2015-09-08 ENCOUNTER — Other Ambulatory Visit: Payer: Self-pay | Admitting: Pulmonary Disease

## 2015-09-19 NOTE — Progress Notes (Signed)
Patient ID: Amy Lane, female   DOB: 05-Nov-1950, 65 y.o.   MRN: [id number] Amy Lane is seen to re-establish with general cardiology  I last saw her in 2013  She has been seeing Amy Amy Lane   for sinus bradycardia status post pacemaker, status post generator replacement and lead repair March 2011.  She also has a history of Ehlers-Danlos syndrome in the context of a marfanoid habitus, mitral valve prolapse status post mitral valve repair at the [facility] clinic with prior TIA on chronic anticoagulation  as well as syncope due to diagnosis of long QT syndrome. SHe has had significant dysautonomic symptoms consistent with POTS S/P left CEA with duplex 03/18/15 reviewed and 1-39% plaque no stenosis   Amy Lane has had recent battle with prostate cancer  She is active Denies palpitations , syncope recurrent TIA or chest pain   Cognitive defect better on aricept  ? Of MS with LE weakness Unable to do MRI due to pacer Seen by Amy Amy Lane Recent left visual field cut but  Carotids normal.  Aricept cut back due to vivid dreams and night sweats  Excited about trip to [place] with friend  Going to [place], [place] , [place] cruise.    ROS: Denies fever, malais, weight loss, blurry vision, decreased visual acuity, cough, sputum, SOB, hemoptysis, pleuritic pain, palpitaitons, heartburn, abdominal pain, melena, lower extremity edema, claudication, or rash.  All other systems reviewed and negative  General: Affect appropriate Healthy:  appears stated age 34: normal Neck supple with no adenopathy JVP normal left CEA bruits no thyromegaly Lungs clear with no wheezing and good diaphragmatic motion Heart:  S1/S2 no murmur,rub, gallop or click pacer under left clavicle  PMI normal Abdomen: benighn, BS positve, no tenderness, no AAA no bruit.  No HSM or HJR Distal pulses intact with no bruits No edema Neuro non-focal Skin warm and dry LE weakness    Current Outpatient Prescriptions   Medication Sig Dispense Refill  . acebutolol (SECTRAL) 200 MG capsule TAKE 1 CAPSULE (200 MG TOTAL) BY MOUTH AT BEDTIME. 90 capsule 2  . citalopram (CELEXA) 20 MG tablet TAKE 1 TABLET EVERY DAY 90 tablet 1  . donepezil (ARICEPT) 10 MG tablet Take 0.5 tablets (5 mg total) by mouth at bedtime.    Marland Kitchen ELIQUIS 5 MG TABS tablet TAKE 1 TABLET BY MOUTH TWICE A DAY 180 tablet 1  . ezetimibe (ZETIA) 10 MG tablet TAKE 1 TABLET (10 MG TOTAL) BY MOUTH DAILY. 90 tablet 1  . levothyroxine (SYNTHROID, LEVOTHROID) 75 MCG tablet TAKE 1 TABLET BY MOUTH EVERY DAY 90 tablet 3  . linaclotide (LINZESS) 72 MCG capsule Take 1 capsule (72 mcg total) by mouth daily before breakfast. 30 capsule 11  . LORazepam (ATIVAN) 1 MG tablet TAKE 1 TABLET 3 TIMES A DAY AS NEEDED 90 tablet 0  . Probiotic Product (ALIGN) 4 MG CAPS Take 4 mg by mouth every morning.    . traMADol (ULTRAM) 50 MG tablet Take 1 tablet (50 mg total) by mouth 3 (three) times daily as needed for moderate pain. 50 tablet 0   No current facility-administered medications for this visit.     Allergies  Other and Statins  Electrocardiogram:  Atrial pacing rate 77 somewhat long delay from P wave and atrial spike  ICRBBB normal ventricular complex 03/19/15  A pacing rate 73  RBBB   Assessment and Plan Pacer:  Reviewed Amy Lane last note normal function seeing him on 10/28/15   POTS:  Improved  with no postural symptoms TIA:  CEA patent by duplex  Now on Eliquis with no bleeding issues ? Related to PAF MVrepair:  No murmur on exam f/u echo SBE Thyroid:   Lab Results  Component Value Date   TSH 2.08 05/31/2015   Chol:   Lab Results  Component Value Date   LDLCALC 87 05/31/2015   Neuro:  Continue aricept for cognitive dysfunction F//U with Amy Lane to see if pacer can be modified for MRI safely but doubt he would want To take leads out.  F/U neuro.  Consider starting Rx for MS empirically   [name]

## 2015-09-20 ENCOUNTER — Encounter: Payer: Self-pay | Admitting: Cardiovascular Disease

## 2015-09-20 ENCOUNTER — Ambulatory Visit (INDEPENDENT_AMBULATORY_CARE_PROVIDER_SITE_OTHER): Payer: No Typology Code available for payment source | Admitting: Cardiovascular Disease

## 2015-09-20 VITALS — BP 122/76 | HR 61 | Ht 71.0 in | Wt 139.1 lb

## 2015-09-20 DIAGNOSIS — I495 Sick sinus syndrome: Secondary | ICD-10-CM | POA: Diagnosis not present

## 2015-09-20 NOTE — Patient Instructions (Signed)

## 2015-10-28 ENCOUNTER — Encounter: Payer: No Typology Code available for payment source | Admitting: Internal Medicine

## 2015-11-02 ENCOUNTER — Ambulatory Visit (INDEPENDENT_AMBULATORY_CARE_PROVIDER_SITE_OTHER): Payer: No Typology Code available for payment source | Admitting: Internal Medicine

## 2015-11-02 ENCOUNTER — Encounter: Payer: Self-pay | Admitting: Internal Medicine

## 2015-11-02 ENCOUNTER — Other Ambulatory Visit: Payer: Self-pay | Admitting: Pulmonary Disease

## 2015-11-02 VITALS — BP 123/74 | HR 57 | Ht 71.0 in | Wt 138.8 lb

## 2015-11-02 DIAGNOSIS — I480 Paroxysmal atrial fibrillation: Secondary | ICD-10-CM

## 2015-11-02 DIAGNOSIS — G909 Disorder of the autonomic nervous system, unspecified: Secondary | ICD-10-CM

## 2015-11-02 DIAGNOSIS — G901 Familial dysautonomia [Riley-Day]: Secondary | ICD-10-CM

## 2015-11-02 DIAGNOSIS — Z950 Presence of cardiac pacemaker: Secondary | ICD-10-CM | POA: Diagnosis not present

## 2015-11-02 DIAGNOSIS — R001 Bradycardia, unspecified: Secondary | ICD-10-CM | POA: Diagnosis not present

## 2015-11-02 NOTE — Progress Notes (Signed)
Patient Care Team: Noralee Space, MD as PCP - General (Pulmonary Disease)   HPI  Amy Lane is a 65 y.o. female is seen in followup for sinus bradycardia status post pacemaker, status post generator replacement and lead repair March 2011.  She also has a history of Ehlers-Danlos syndrome in the context of a marfanoid habitus, mitral valve prolapse status post mitral valve repair at the [facility] clinic with prior TIA on chronic Coumadin as well as syncope due to diagnosis of long QT syndrome. She has had significant dysautonomic symptoms consistent with POTS   The patient denies SOB, chest pain, edema or palpitations  She is traveling      Past Medical History:  Diagnosis Date  . Abnormal gait   . Allergic rhinitis   . Atrial fibrillation ([place])   . Carcinomas, basal cell   . Cardiac pacemaker    DDD  MDT  . Cerebrovascular disease   . Depression   . Dysautonomia   . Ehler's-Danlos syndrome   . Fibromyalgia   . Headache(784.0)    Migraine  . Hemorrhoids   . Herpes zoster   . History of syncope   . History of tachycardia-bradycardia syndrome   . History of transient ischemic attack   . Hypercholesteremia   . Hypercholesterolemia   . Hypothyroidism   . Long term (current) use of anticoagulants   . Mitral valve prolapse   . PAT (paroxysmal atrial tachycardia) ([place])   . PSVT (paroxysmal supraventricular tachycardia) ([place])   . Skin desquamation    inflammative vaginitis  . SVT (supraventricular tachycardia) ([facility])     Past Surgical History:  Procedure Laterality Date  . complex mitral valve repair     at the [facility]  . left carotid endarterectomy  11/2003   by Dr. Delton See  . PACEMAKER INSERTION     medtronic kappa 901  . removed chalazion from left eye lid  09/2011   Dr. Satira Sark    Current Outpatient Prescriptions  Medication Sig Dispense Refill  . acebutolol (SECTRAL) 200 MG capsule TAKE 1 CAPSULE (200 MG TOTAL) BY MOUTH AT BEDTIME. 90  capsule 2  . citalopram (CELEXA) 20 MG tablet TAKE 1 TABLET EVERY DAY 90 tablet 1  . donepezil (ARICEPT) 10 MG tablet Take 0.5 tablets (5 mg total) by mouth at bedtime.    Marland Kitchen ELIQUIS 5 MG TABS tablet TAKE 1 TABLET BY MOUTH TWICE A DAY 180 tablet 1  . ezetimibe (ZETIA) 10 MG tablet TAKE 1 TABLET (10 MG TOTAL) BY MOUTH DAILY. 90 tablet 1  . levothyroxine (SYNTHROID, LEVOTHROID) 75 MCG tablet TAKE 1 TABLET BY MOUTH EVERY DAY 90 tablet 3  . linaclotide (LINZESS) 72 MCG capsule Take 1 capsule (72 mcg total) by mouth daily before breakfast. 30 capsule 11  . LORazepam (ATIVAN) 1 MG tablet TAKE 1 TABLET 3 TIMES A DAY AS NEEDED 90 tablet 0  . Probiotic Product (ALIGN) 4 MG CAPS Take 4 mg by mouth every morning.    . traMADol (ULTRAM) 50 MG tablet Take 1 tablet (50 mg total) by mouth 3 (three) times daily as needed for moderate pain. 50 tablet 0   No current facility-administered medications for this visit.     Allergies  Allergen Reactions  . Other Other (See Comments)    All QTc prolongation drugs  . Statins Other (See Comments)    myalgias    Review of Systems negative except from HPI and PMH  Physical Exam  BP 123/74   Pulse (!) 57   Ht 5\' 11"  (1.803 m)   Wt 138 lb 12.8 oz (63 kg)   SpO2 98%   BMI 19.36 kg/m  Well developed and well nourished in no acute distress HENT normal E scleral and icterus clear Neck Supple JVP flat; carotids brisk and full Clear to ausculation vRegular rate and rhythm, no murmurs gallops or rub Soft with active bowel sounds No clubbing cyanosis  Edema Alert and oriented, grossly normal motor and sensory function Skin Warm and Dry    Assessment and  Plan  Recurrent TIA  Mitral valve repair  Possible long QT  POTS/dysautonomia  Marfanoid habitus  Atrial fibrillation-paroxysmal  Pacemaker The patient's device was interrogated.  The information was reviewed. No changes were made in the programming.     The patient has had a subsequent TIA  while on anticoagulation;  she is now on apixaban.  No bleeding issues   Some  lightheadedness.  Her husband has been intercurrently diagnosed with an aggressive form of prostate cancer. It undergone surgery and radiation therapy. He awaits next step. He and his sons are refinishing cars. They are not traveling   We spent more than 50% of our >25 min visit in face to face counseling regarding the above

## 2015-11-02 NOTE — Patient Instructions (Signed)
Medication Instructions: - Your physician recommends that you continue on your current medications as directed. Please refer to the Current Medication list given to you today.  Labwork: - none ordered  Procedures/Testing: - none ordered  Follow-Up: - Remote monitoring is used to monitor your Pacemaker of ICD from home. This monitoring reduces the number of office visits required to check your device to one time per year. It allows [place] to keep an eye on the functioning of your device to ensure it is working properly. You are scheduled for a device check from home on 02/11/15. You may send your transmission at any time that day. If you have a wireless device, the transmission will be sent automatically. After your physician reviews your transmission, you will receive a postcard with your next transmission date.  - Your physician wants you to follow-up in: 1 year with Dr. Caryl Comes. You will receive a reminder letter in the mail two months in advance. If you don't receive a letter, please call our office to schedule the follow-up appointment.  Any Additional Special Instructions Will Be Listed Below (If Applicable).     If you need a refill on your cardiac medications before your next appointment, please call your pharmacy.

## 2015-11-03 LAB — CUP PACEART INCLINIC DEVICE CHECK
Date Time Interrogation Session: 20171011161838
Implantable Lead Location: 753860
MDC IDC LEAD IMPLANT DT: 19981106
MDC IDC LEAD IMPLANT DT: 20031031
MDC IDC LEAD LOCATION: 753859

## 2015-11-05 NOTE — Telephone Encounter (Signed)
Received electronic refill request for  Lorazepam 1mg  TAKE 1 TABLET 3 TIMES A DAY AS NEEDED # 90 with no refills Last refilled 08/19/15  SN please advise on refill

## 2015-11-19 ENCOUNTER — Other Ambulatory Visit: Payer: Self-pay | Admitting: Pulmonary Disease

## 2015-11-28 ENCOUNTER — Other Ambulatory Visit: Payer: Self-pay | Admitting: Pulmonary Disease

## 2015-12-01 ENCOUNTER — Ambulatory Visit: Payer: No Typology Code available for payment source | Admitting: Pulmonary Disease

## 2015-12-14 ENCOUNTER — Ambulatory Visit (INDEPENDENT_AMBULATORY_CARE_PROVIDER_SITE_OTHER): Payer: No Typology Code available for payment source | Admitting: Pulmonary Disease

## 2015-12-14 ENCOUNTER — Encounter: Payer: Self-pay | Admitting: Pulmonary Disease

## 2015-12-14 VITALS — BP 110/70 | HR 83 | Temp 98.7°F | Ht 70.5 in | Wt 141.1 lb

## 2015-12-14 DIAGNOSIS — I495 Sick sinus syndrome: Secondary | ICD-10-CM | POA: Diagnosis not present

## 2015-12-14 DIAGNOSIS — Q796 Ehlers-Danlos syndrome, unspecified: Secondary | ICD-10-CM

## 2015-12-14 DIAGNOSIS — G901 Familial dysautonomia [Riley-Day]: Secondary | ICD-10-CM

## 2015-12-14 DIAGNOSIS — I059 Rheumatic mitral valve disease, unspecified: Secondary | ICD-10-CM | POA: Diagnosis not present

## 2015-12-14 DIAGNOSIS — E7800 Pure hypercholesterolemia, unspecified: Secondary | ICD-10-CM

## 2015-12-14 DIAGNOSIS — I480 Paroxysmal atrial fibrillation: Secondary | ICD-10-CM | POA: Diagnosis not present

## 2015-12-14 DIAGNOSIS — Z86718 Personal history of other venous thrombosis and embolism: Secondary | ICD-10-CM

## 2015-12-14 DIAGNOSIS — G909 Disorder of the autonomic nervous system, unspecified: Secondary | ICD-10-CM

## 2015-12-14 DIAGNOSIS — E039 Hypothyroidism, unspecified: Secondary | ICD-10-CM

## 2015-12-14 DIAGNOSIS — R269 Unspecified abnormalities of gait and mobility: Secondary | ICD-10-CM

## 2015-12-14 DIAGNOSIS — Z950 Presence of cardiac pacemaker: Secondary | ICD-10-CM

## 2015-12-14 DIAGNOSIS — R413 Other amnesia: Secondary | ICD-10-CM

## 2015-12-14 DIAGNOSIS — K5909 Other constipation: Secondary | ICD-10-CM

## 2015-12-14 MED ORDER — IPRATROPIUM BROMIDE 0.06 % NA SOLN: 2.0000 | Freq: Two times a day (BID) | NASAL | 12 refills | Status: DC

## 2015-12-14 NOTE — Progress Notes (Signed)
Subjective:    Patient ID: Amy Lane, female    DOB: [birth year], 65 y.o.   MRN: [id number]  HPI 65 y/o WF here for a follow up visit... she has mult med problems as noted below... she is followed regularly by Drs Maryellen Pile for Cardiology, and Drs Jeri Modena for GI... ~  SEE PREV EPIC NOTES FOR OLDER DATA >>     LABS 4/14:  FLP- not at goals on diet alone but intol to all meds;  Chems- wnl;  CBC- wnl;  TSH=3.10;  VitD=19 & Rec VitD OTC supplement ~2000u daily...  April 11, 2013:  Amy Lane was [facility] 1/6 - 01/30/13 by [facility] w/ HA & altered mental status- ?TIA vs complex migraine- CT Head was neg; CDopplers were ok; 2DEcho showed norm LVF, myxomatous MV s/p repair no resid MR; Coumadin was continued and ASA81 was added... She had ROV DrWillis 2/15> he felt the episode was a migraine equiv & no recurrent prob since then, she notes mild memory impairment w/ MMSE= 29/30... She notes that she's been under a lot of stress w/ father-in-law illness, husb w/ prostate ca, sold house & had to move...   CXR 01/28/13 showed norm heart size, pacemeaker & sternal wires, hyperinflation and asymmetric biapical pleural thickening L>R, NAD...  2DEcho 01/28/13 showed norm LV size & function w/ EF=55-60%, myxomatous MV s/p MVrepair & no resid MR, mild LA dil...  LABS 4/15:  FLP- at goals on Zetia10;  Chems- wnl;  CBC- wnl;  TSH=4.99... ~  [id number]:  Amy Lane has been under some stress- husb Fritz Pickerel) was Dx w/ recurrent prostate cancer at the [facility] & underwent XRT (they had to live in [place] for 86mo while he was receiving therapy);   LABS 3/16:  FLP- at goals on Zetia10;  Chems- wnl;  CBC- wnl;  TSH=3.24   Sed=15;  Anti-CCP=neg   ~  November 30, 2014:  73mo ROV & Amy Lane report a good interval overall- she had some constipation, went to the ER (given BE prep)/ then on to see DrJacobs in GI & started on [id number] which is working very well for her;  We reviewed the following medical problems during today's office visit  >>     AR> on Benedryl OTC & Nasonex prn; breathing is good w/o cough, sput, SOB, etc...    Hx Dysautonomia (POTS) & Ehlers-Danlos syndrome> followed by DrNishan/DrKlein & DrHawkes (seen 4/16 w/ hand pain/weakness, polyarthralgia, OA); BP= 118/70 w/o postural change, on Eliquis5Bid...    MVP, Myxomatous valve- s/p MVrepair (in [place])> on Eliquis5Bid; she is followed by DrKlein- seen 4/16 & stable pacer & no changes made...    Hx Tachy-brady, Fib-flutter w/ ablation & pacer> on Sectral200/d, & Eliquis5Bid; she prev did her own Protimes at home & called to the CC for adjustments=> changed to Eliquis5Bid.    Cerebrovasc Dis> on Eliquis5Bid, s/p left CAE 2005 by [facility] for asymptomatic left carotid stenosis w/ ulceration, & f/u CDopplers all ok...    ?DVT & ?PE while on Coumadin (during trip to [place])> she then checked her own protimes like they do in [place]...    CHOL> on Zetia10 since 1/15 Hosp where LDL was 155; FLP on Zetia10 3/16 shows TChol 167, TG 66, HDL 61, LDL 93; she is intol to all other meds...    Hypothy> on Synthroid75; lab 3/16 shows TSH= 3.24; continue same meds, take pill first thing in morn, etc...    Underweight> BMI in the 19-20 range & we discussed  nutritional supplements etc...     GI- Constip & Hems> on Align; c/o constip & now improved on Linzess per DrJacobs; last colon 2009 by DrJacobs w/ hems only & f/u planned 8yrs...    GYN- kraurosis vulvae (DIV)> local GYN, [facility] vulva clinic treatment & she is up to date on screening; Hx enterococcus UTI...    Migraine HAs & ?TIA> she is followed by [facility] Neuro- DrWillis; Hosp 1/15 w/ another episode, symptoms cleared, ASA81 added to her Coumadin which was later changed to Eliquis5Bid; she had c/o some leg weakness but EMG/NCV was normal & rec to try PT...    Anxiety/ Depression> on Ativan 1mg  prn & Celexa20 which she really thinks is helping...  EXAM shows Afeb, VSS, O2sat=98% on RA;  Marfanoid habitus;  HEENT- neg,  mallampati2;  Chest- clear w/o w/r/r;  Heart- RR w/o m/r/g;  Abd- soft, nontender;  Ext- neg w/o c/c/e;  Neuro- intact.. IMP/PLAN>>  OK Flu shot today;  Meds refilled- continue same and ROV follow up in 44mo...  ~  May 31, 2015:  32mo ROV & Amy Lane has had a lot going on neurologically in the interval>  She fell at home w/ fx bones in left foot- eval & cast by DrHewitt, just got the cast off to boot now; she had f/u DrWillis w/ mild cognitive impairment & he started Aricept5=>10 & pt feels much better, memory improved, not getting lost driving, etc;  Also says she was told "prob MS" (falls, weakness, bladder, etc) but they can't do MRI due to her pacer;  She has a great attitude, feeling well today, no prob w/ meds, due for CXR & FASTING blood work today... Given PREVNAR-13 today as well.    AR> on Benedryl OTC & Nasonex prn; breathing is good w/o cough, sput, SOB, etc; doing OK even w/ the pollen...    Hx Dysautonomia (POTS) & Ehlers-Danlos syndrome> followed by DrNishan/DrKlein & DrHawkes (seen 4/16 w/ hand pain/weakness, polyarthralgia, OA- we do not have more recent note); BP= 118/70 no postural change, on Eliquis5Bid.    MVP, Myxomatous valve- s/p MVrepair (in [place])> on Eliquis5Bid; she is followed by DrKlein- seen 4/16 & stable pacer & no changes made...    Hx Tachy-brady, Fib-flutter w/ ablation & pacer> on Sectral200/d, & Eliquis5Bid; she prev did her own Protimes at home & changed to Eliquis5Bid...    Cerebrovasc Dis> on Eliquis5Bid, s/p left CAE 2005 by [facility] for asymptomatic left carotid stenosis w/ ulceration, & f/u CDopplers all ok...    ?DVT & ?PE while on Coumadin (during trip to [place])> she then checked her own protimes like they do in [place]; now on eliquis5Bid & stable...    CHOL> on Zetia10 since 1/15 Hosp where LDL was 155; FLP on Zetia10 5/17 shows TChol 168, TG 105, HDL 60, LDL 87; she is intol to all other meds...    Hypothy> on Synthroid75; lab 5/17 shows TSH= 2.08; continue same  meds, take pill first thing in morn, etc...    Underweight> BMI in the 19-20 range & we discussed nutritional supplements etc=> wt up to 142#...     GI- Constip & Hems> on Align; c/o constip & now improved on Linzess per DrJacobs; last colon 2009 by DrJacobs w/ hems only & f/u planned 47yrs...    GYN- kraurosis vulvae (DIV)> local GYN, [facility] vulva clinic treatment & she is up to date on screening; Hx enterococcus UTI...    Migraine HAs & ?TIA, Mild cognitive impairment, ?MS> she is  followed by [place] [facility]- DrWillis; Hosp 1/15 w/ another episode, symptoms cleared, ASA81 added to her Coumadin which was later changed to Eliquis5Bid; she had c/o some leg weakness but EMG/NCV was normal & rec to try PT;  She reports [facility] on Aricept & improved memory she says; DrWillis indicated that he thinks she has MS but they can't get MRI w/ her pacer...    Anxiety/ Depression> on Ativan 1mg  prn & Celexa20 which she really thinks is helping...  EXAM shows Afeb, VSS, O2sat=98% on RA;  Marfanoid habitus;  HEENT- neg, mallampati2;  Chest- clear w/o w/r/r;  Heart- RR w/o m/r/g;  Abd- soft, nontender;  Ext- neg w/o c/c/e;  Neuro- intact..  CXR 05/31/15>  Stable- norm heart size, s/p median sternotomy & MVR, pacer in situ, hyperinflated appearance suggests underlying COPD, scarring left apex, NAD...  LABS 05/31/15>  FLP- at goals on Zetia10;  Chems- wnl;  CBC- wnl;  TSH=2.08;   IMP/PLAN>>  Amy Lane looks good, she has gained a few lbs eating ice cream Qhs, yet her Lipids remain in good control, Labs normal, CXR clear; she is planning a [place] cruise this summer w/ a nurse friend; stable on current med rx- continue same, given PREVNAR-13 today...  ~  December 14, 2015:  54mo ROV & Amy Lane reports doing well- feels good and w/o complaints except a constant runny nose; a friend uses Atrovent nasal spray & she wonders if she can try this- Rx written for 1-2 sprays in each nostril Bid;  She is looking forward to another [place] vacation next  yr;  Her main source of exercise is walking... Prob List reviewed- given 2017 flu vaccine today...    She saw DrKlein 11/02/15>  Hx SBrady, s/p pacer w/ generator change and lead repair 03/2009; she has Ehlers-Danlos syndrome, marfanoid habitus, Hx MVP w/ MV repair in [place]; she has hx prior TIA, syncope from long QT syndrome; and signif dysautonomia c/w postural orthostatic tachycardia syndrome (POTS);  Pacer functioning well, no changes made to the programming; continue Eliquis...    She saw Cherly Hensen 09/20/15>  Note reviewed, no changes made in meds...     She saw DrWillis 06/15/15>  Note reviewed, Hx migraines, gait abn, reported gradual change in memory which improved significantly on Aricept5, Exam was neg, Prev Carotid dopplers 02/2015 & 06/2015 were OK showing heterogeneous plaque bilat w/ prev left CEA & DPA, 1-39% bilat ICSstenoses, norm subclav & norm vertebrals bilaterally...  IMP/PLAN>>  OK 2017 Flu shot, continue same meds and Rx written for Atrovent nasal spray at her request; we will continue Q59mo ROVs...           Problem List:      ALLERGIC RHINITIS (ICD-477.9) - we discussed Rx w/ Zyrtek, Astepro Prn, etc... ~  CXR 3/11 showed left pacer, sternal wires, NAD.Marland Kitchen. ~  CXR 6/13 showed stable post-op changes and pacer, pulm hyperinflation & scattered scarringt, calcif mediastinal nodes, no focal opacities, NAD.Marland Kitchen. ~  CXR 1/15 showed norm heart size, post op changes & pacer, no adenop, no edema, clear x some left apical pleural thickening w/o change...  MITRAL VALVE PROLAPSE (ICD-424.0) - followed by Cherly Hensen... ~  2DEcho 11/08 showed myxomatous MV, annuloplasty ring, decr post leaflet excursion, norm LVH & wall motion... ~  NuclearStressTest 11/08 showed mild apical thinning... prev study 12/04 w/o ischemia or infarct & EF=68%... ~  she sees [place] every 6 months- for f/u MVP (s/p MV repair at [facility]), dysautonomia (s/p pacer), & Ehlers-Danlos Syndrome> last seen  1/15 & note  reviewed, EKG w/ irreg rhythm, borderline QT interval... ~  2DEcho 1/15> cavity size was normal, wall thickness was normal w/ norm systolic function was normal & EF= 55% to 60%, myxomatous MV s/p repair with no residual MR, LA is mildly dilated.  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 hx AFib/Flutter w/ ablation & pacemaker placed ... she has Ehlers-Danlos synd w/ prolonged QT interval...  CARDIAC PACEMAKER IN SITU (ICD-V45.01) - f/u DrKlein w/ CTChest 4/09- neg. ~  dual chamber pacer changed 3/11 by DrKlein for end-of-life... ~  EKG shows NSR, 1st degree AVB, RBBB... ~  3/15: she had f/u DrKlein> note reviewed, pacer functioning well- no changes made but he wanted her to consider a NOAC...  DYSAUTONOMIA (ICD-742.8) - prev on Proamatine 5mg  tabs per DrKlein, but this was stopped...  CEREBROVASCULAR DISEASE (ICD-437.9) - on ASA 81mg /d & COUMADIN => 11/2014 she was switched to Eliquis5Bid... ~  she had a Left CAE 11/05 by [facility] for symptomatic left carotid stenosis w/ ulceration... ~  Carotid angiogram 5/08 by [facility] showed <30% right carotid stenosis ~  f/u CTA of Head & Neck 12/09 was neg= norm CTA head, & no signif carotid dis noted in neck. ~  CDoppler 2/11 showed stable mild carotid dis, left CAE w/ DPA is patent, 0-39% bilat ICA stenoses. ~  repear CDopplers 11/11 per Cherly Hensen showed smooth plaque in right bulb & distal left CCA- stable; 0-39% bilat ICA stenoses... ~  CT Head 6/13 is neg- No acute intracranial abnormalities... ~  CTA Brain 6/13 showed mild atherosclerotic dis in the carotid siphon regions bilat w/o stenoses, otherw norm CTA brain w/o lesions... ~  CT Angio Neck 6/13 via ER showed mild nonstenotic arteriosclerotic dis at both carotid bifurcations w/o signif stenoses... ~  CDopplers 11/13 showed mild soft smooth plaque w/ 0-39% right ICAstenosis & 40-59% left ICA stenosis, vertebrals are antegrade... ~  CDopplers 1/15  showed 1-39% internal carotid artery stenosis bilaterally & antegrade vertebral flow... ~  CDopplers 2/16 showed mixed plaque bilat, stable 1-39% bilat ICAstenoses w/ left CEA & DPA, norm subclav & patent vertebrals...  ?DVT & poss PTE while on vacation in [place] during the summer of 2012 >> pt was on Coumadin & INR was therapeutic; she had pain & swelling in left leg w/ a pos doppler reported by a [place] physician; she was also SOB & they questioned poss PTE but didn't get scan or further eval;  On return to [place] DrNishan wondered if she might have bled into the leg muscles & sent her to DrNorris for Ortho eval; he could not corroborate the theory & treated her w/ compression stockings & she gradually improved towards her baseline...  HYPERCHOLESTEROLEMIA (ICD-272.0) - on diet alone now, prev on Lip20- this was stopped 1/12 by Cherly Hensen & she feels much better off this med! ~  [place] 05/13/07 on Lip20 showed TChol 119, TG 50, HDL 55, LDL 54 ~  FLP 5/10 on Lip20 showed TChol 120, TG 43, HDL 58, LDL 54 ~  FLP 9/11 on Lip20 showed TChol 130, TG 54, HDL 59, LDL 60 ~  FLP 5/12 on diet alone showed TChol 218, TG 91, HDL 57, LDL 135... Continue low chol, low fat diet. ~  FLP 10/12 showed TChol 231, TG 86, HDL 68, LDL 151... I rec the Lipid CLinic, & they started NIACIN 500mg /d. ~  FLP 2/13 on Niacin500 showed TChol 199, TG 55, HDL 76, LDL 112.Marland KitchenMarland Kitchen  Much improved, continue same. ~  FLP 11/13 on diet alone showed TChol 231, TG 49, HDL 67, LDL 151..,. rec refer to Lipid clinic. ~  [place] 4/14 on diet + Niacin showed TChol 233, TG 78, HDL 75, LDL 146  ~  FLP 1/15 in hosp showed Tchol 235, TG 79, HDL 64, LDL 155 => ZETIA 10mg /d started... ~  [place] 3/15 on Zetia10 showed TChol 172, TG 74, HDL 59, LDL 89... Continue same... ~  [place] 3/16 on Zetia10 showed TChol 167, TG 66, HDL 61, LDL 93  HYPOTHYROIDISM (ICD-244.9) - currently on SYNTHROID 54mcg/d... ~  labs 4/09 on Levoth88 showed TSH = 0.10... rec to decr Synthroid to  60mcg/d... ~  labs 9/09 on Levoth75 showed TSH = 1.67 ~  labs 5/10 on Levoth75 showed TSH = 2.92... Continue same. ~  labs 9/11 on Levoth75 showed TSH= 1.37 ~  Labs 5/12 on Levothy75 showed TSH= 4.33 ~  Labs 10/12 on Levothy75 showed TSH= 1.51... Continue same. ~  Labs 11/13 on Levothy75 showed TSH= 1.55 ~  Labs 4/14 on Levothy75 showed TSH= 3.10 ~  Labs 3/15 on Levothy75 showed TSH= 4.99... Reminded to take every AM on empty stomach etc. ~  Labs 3/16 showed TSH= 3.24  HEMORRHOIDS (ICD-455.6) - last colonoscopy 2/09 by DrJacobs showed only sm hems... f/u 110yrs.  GYN - followed by DrMcPhail and Dx w/ kraurosis vulvae 7/10... Prev on Premarin VagCream. ~  11/10: she discussed the Dx w/ me & will inquire about poss hyst/ BSO... ~  4/11:  eval in the [facility] by Dr Illene Bolus showed vulvar dermatitis & dryness w/ rec for "Crisco" Tid! 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   FIBROMYALGIA (ICD-729.1) - she has been doing Yoga classes and this has helped... ~  She saw DrNorris 9/14 w/ neck pain> CT CSpine showed degen disc dis & he rec PT... ~  3/16: she is c/o persistent bilat hand pain & swelling, not much better w/ OTC NSAIDs; we decided to check Anti-CCP (neg) & Sed (15); we will Rx w/ Tramadol50Tid prn, hot soaks, & refer to Rheum for further eval...  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  Hx of TRANSIENT ISCHEMIC ATTACK (ICD-435.9) - on ASA 81mg /d & COUMADIN> she has been followed by DrWillis; s/p left carotid endarterectomy 11/05 by [facility]. ~  1/12: f/u eval by DrWillis reviewed... ~  6/13: she presented w/ ?TIA symptoms & eval was neg; review by DrWillis indicated prob Migraine & everything resolved... ~  1/15: she had another episode, Hosp by [facility] x2d but felt to prob be Migraine related... ~  11/15: she had f/u  appt w/ DrWillis> Hx Ehlers-Danlos, Migraines, & c/o some leg weakness (some difficulty rising from squat); he did EMG/NCV (neg-no myopathy, no neuropathy) & rec phys therapy  Hx of SYNCOPE (ICD-780.2) - felt to be neurally mediated, ? related to long QT, no recur since pacer placed...  MIGRAINE HEADACHE >> she presented w/ ?TIA symptoms 6/13 & eval was neg; review by DrWillis indicated prob Migraine & everything resolved, no recurrence... ~  Seen by Neuro 2/15 DrWillis w/ migraines, memory deficit, abn of gait;   DEPRESSION (ICD-311) - she takes LORAZEPAM 1mg Qhs for insomnia... she prev saw DrGraves for counselling in [place]... ~  11/10: poor appetite & some wt loss prob related to depression & we discussed trial  Lexapro 10mg /d... ~  3/11: she is very pleased- feels better, gained 5# w/ Ensure, etc... ~  9/11:  "I feel great" & Lexapro continued... ~  She was able to stop the Lexapro 1/12 (also stopped the Lipitor) & has done well off it ever since... ~  10/13:  She called w/ situational depression due to marital stress, getting counseling, etc; started PROZAC 20mg /d & she reports much improved... ~  2015: she is off the Prozac and using Ativan 1mg  prn... ~  9/15: on Ativan1mg  prn and CELEXA20 called in several mo ago & she is much improved...  CARCINOMA, BASAL CELL (ICD-173.9) - skin cancer removed from left leg by [facility] 2008. ~  12/11:  She reports bx of rash on legs= lichen planus per DrHall, treated w/ topical ointment... ~  2/13:  She had a skin lesion removed from her neck= basal cell ca & wide excision performed by DrLupton.  Health Maintenance: ~  GI:  followed by DrJacobs w/ colonoscopy 2/09 showing only sm hems... ~  GYN:  followed by DrMcPhail, and Dr. Illene Bolus at [place] [facility]... ~  Labs 9/11 showed Vit D level = 39 & rec to start 1000 u OTC supplement... ~  Immunizations:  she had TDAP 3/11... she gets yearly Flu vaccine in the Fall of the yr...    Past  Surgical History:  Procedure Laterality Date  . complex mitral valve repair     at the [facility]  . left carotid endarterectomy  11/2003   by Dr. Delton See  . PACEMAKER INSERTION     medtronic kappa [phone]  . removed chalazion from left eye lid  09/2011   Dr. Satira Sark    Outpatient Encounter Prescriptions as of 12/14/2015  Medication Sig Dispense Refill  . acebutolol (SECTRAL) 200 MG capsule TAKE 1 CAPSULE (200 MG TOTAL) BY MOUTH AT BEDTIME. 90 capsule 2  . citalopram (CELEXA) 20 MG tablet TAKE 1 TABLET EVERY DAY 90 tablet 1  . donepezil (ARICEPT) 10 MG tablet Take 0.5 tablets (5 mg total) by mouth at bedtime.    Marland Kitchen ELIQUIS 5 MG TABS tablet TAKE 1 TABLET BY MOUTH TWICE A DAY 180 tablet 1  . ezetimibe (ZETIA) 10 MG tablet TAKE 1 TABLET (10 MG TOTAL) BY MOUTH DAILY. 90 tablet 0  . levothyroxine (SYNTHROID, LEVOTHROID) 75 MCG tablet TAKE 1 TABLET BY MOUTH EVERY DAY 90 tablet 3  . linaclotide (LINZESS) 72 MCG capsule Take 1 capsule (72 mcg total) by mouth daily before breakfast. 30 capsule 11  . LORazepam (ATIVAN) 1 MG tablet TAKE 1 TABLET 3 TIMES A DAY AS NEEDED 90 tablet 5  . Probiotic Product (ALIGN) 4 MG CAPS Take 4 mg by mouth every morning.    . traMADol (ULTRAM) 50 MG tablet Take 1 tablet (50 mg total) by mouth 3 (three) times daily as needed for moderate pain. 50 tablet 0  . ipratropium (ATROVENT) 0.06 % nasal spray Place 2 sprays into both nostrils 2 (two) times daily. 15 mL 12  . [DISCONTINUED] ezetimibe (ZETIA) 10 MG tablet TAKE 1 TABLET (10 MG TOTAL) BY MOUTH DAILY. (Patient not taking: Reported on 12/14/2015) 90 tablet 1   No facility-administered encounter medications on file as of 12/14/2015.     Allergies  Allergen Reactions  . Other Other (See Comments)    All QTc prolongation drugs  . Statins Other (See Comments)    myalgias    Current Medications, Allergies, Past Medical History, Past Surgical History, Family  History, and Social History were reviewed in [employer] record.    Review of Systems         See HPI - all other systems neg except as noted... The patient complains of dyspnea on exertion.  The patient denies anorexia, fever, weight loss, weight gain, vision loss, decreased hearing, hoarseness, chest pain, syncope, peripheral edema, prolonged cough, headaches, hemoptysis, abdominal pain, melena, hematochezia, severe indigestion/heartburn, hematuria, incontinence, muscle weakness, suspicious skin lesions, transient blindness, difficulty walking, depression, unusual weight change, abnormal bleeding, enlarged lymph nodes, and angioedema.     Objective:   Physical Exam     WD, Thin, 65 y/o WF in NAD... she is 5'11"Tall and 141# = BMI~20... Vital Signs:  Reviewed... GENERAL:  Alert & oriented; pleasant & cooperative... HEENT:  [place]/AT, EOM-wnl, PERRLA, EACs-clear, TMs-wnl, NOSE-clear, THROAT-clear & wnl. NECK:  Supple w/ fair ROM; no JVD; normal carotid impulses w/o bruits, left CAE scar; no thyromegaly or nodules palpated; no lymphadenopathy. CHEST:  Clear to P & A; without wheezes/ rales/ or rhonchi heard... HEART:  Regular Rhythm; pacer on left, without murmurs/ rubs/ or gallops detected... ABDOMEN:  Soft & nontender; normal bowel sounds; no organomegaly or masses palpated... EXT: without deformities or arthritic changes; no varicose veins/ venous insuffic/ or edema. NEURO:  CN's intact;  no focal neuro deficits... DERM:  s/p skin cancer removed from left leg; min rash noted...  RADIOLOGY DATA:  Reviewed in the EPIC EMR & discussed w/ the patient...  LABORATORY DATA:  Reviewed in the EPIC EMR & discussed w/ the patient...   Assessment & Plan:    MVP/ MV Repair> Tachy-Brady/ PACER> Dysautonomia>  Followed by Cherly Hensen & DrKlein, doing well on Eliquis & Sectral, continue same...  Cerebrovasc Dis>  Off ASA now that she's on EliquisBid, s/p left CAE 11/05, no recurrent cerebral ischemic symptoms & CDopplers  stable...  ?DVT while in [place]> moot issue at present; prev on Coumadin now Eliquis; leg swelling diminished on the compression hose, no salt, elevation, etc; she is back to her exercise program & back to baseline...  CHOL>  She feels much better off the Lipitor & FLP improved w/ Zetia10 started 1/15 [facility]- continue same...  Hypothyroid>  Stable on the Synthroid 50mcg/d...  GYN>  Stable & followed both here & at [facility] (see above)...  EHLERS-DANLOS>  Aware, stable, she has been feeling better & in great spirits...  Migraines, ?TIA, ?MS, mild cognitive impairment> [facility] 6/13 & 1/15 w/ ?TIA but DrWillis thought likely Migraine syndrome- resolved, neg work up, no recurrence & they are following... Neuro> she mentions several symtoms that she is concerned about- clumsy, balance off, subjective leg weakness- & she is concerned; therefore refer to Neurology for eval... 5/17> she indicates that DrWillis Dx mild cognitive impairment & treated w/ Aricept10 & she feels much better she says  Depression>  Off Prozac20 & on Celexa20 & Ativan prn; she & husb received counseling...  Other medical problems as noted...   Patient's Medications  New Prescriptions   IPRATROPIUM (ATROVENT) 0.06 % NASAL SPRAY    Place 2 sprays into both nostrils 2 (two) times daily.  Previous Medications   ACEBUTOLOL (SECTRAL) 200 MG CAPSULE    TAKE 1 CAPSULE (200 MG TOTAL) BY MOUTH AT BEDTIME.   CITALOPRAM (CELEXA) 20 MG TABLET    TAKE 1 TABLET EVERY DAY   DONEPEZIL (ARICEPT) 10 MG TABLET    Take 0.5 tablets (5 mg total) by mouth at bedtime.   ELIQUIS 5  MG TABS TABLET    TAKE 1 TABLET BY MOUTH TWICE A DAY   EZETIMIBE (ZETIA) 10 MG TABLET    TAKE 1 TABLET (10 MG TOTAL) BY MOUTH DAILY.   LEVOTHYROXINE (SYNTHROID, LEVOTHROID) 75 MCG TABLET    TAKE 1 TABLET BY MOUTH EVERY DAY   LINACLOTIDE (LINZESS) 72 MCG CAPSULE    Take 1 capsule (72 mcg total) by mouth daily before breakfast.   LORAZEPAM (ATIVAN) 1 MG TABLET    TAKE 1 TABLET 3  TIMES A DAY AS NEEDED   PROBIOTIC PRODUCT (ALIGN) 4 MG CAPS    Take 4 mg by mouth every morning.   TRAMADOL (ULTRAM) 50 MG TABLET    Take 1 tablet (50 mg total) by mouth 3 (three) times daily as needed for moderate pain.  Modified Medications   No medications on file  Discontinued Medications   EZETIMIBE (ZETIA) 10 MG TABLET    TAKE 1 TABLET (10 MG TOTAL) BY MOUTH DAILY.

## 2015-12-14 NOTE — Patient Instructions (Signed)
Today we updated your med list in our EPIC system...    Continue your current medications the same...  Today we gave you the 2017 FLU vaccine...  Keep up the exercise program...  Call for any questions...  Let's plan a follow up visit in 35mo, sooner if needed for problems.Marland KitchenMarland Kitchen

## 2015-12-23 ENCOUNTER — Ambulatory Visit (INDEPENDENT_AMBULATORY_CARE_PROVIDER_SITE_OTHER): Payer: PRIVATE HEALTH INSURANCE | Admitting: Neurology

## 2015-12-23 ENCOUNTER — Encounter: Payer: Self-pay | Admitting: Neurology

## 2015-12-23 VITALS — BP 122/73 | HR 81 | Ht 70.5 in | Wt 142.5 lb

## 2015-12-23 DIAGNOSIS — R413 Other amnesia: Secondary | ICD-10-CM | POA: Diagnosis not present

## 2015-12-23 DIAGNOSIS — G43001 Migraine without aura, not intractable, with status migrainosus: Secondary | ICD-10-CM | POA: Diagnosis not present

## 2015-12-23 NOTE — Progress Notes (Signed)
Reason for visit: Memory disturbance  Amy Lane is an 65 y.o. female  History of present illness:  Amy Lane is a 65 year old right-handed white female with a history of Ehlers-Danlos syndrome, history of migraine headaches, and fibromyalgia. The patient has had some mild memory issues, she has had some side effects on the full Aricept dose, this was reduced to 5 mg a day. The patient has done much better on this dose without much diarrhea, she feels that her cognitive issues are relatively well controlled, she still has some problems with figuring out mechanical devices and with depth perception. She feels that she is improved with word finding and general memory. She operates a Teacher, music without difficulty. She is still working, but she is going to retire soon. The patient has not had any new significant medical issues that have come up since last seen. She returns for an evaluation. She indicates that she feels as well now than she has in 10 years or more.  Past Medical History:  Diagnosis Date  . Abnormal gait   . Allergic rhinitis   . Atrial fibrillation ([place])   . Carcinomas, basal cell   . Cardiac pacemaker    DDD  MDT  . Cerebrovascular disease   . Depression   . Dysautonomia   . Ehler's-Danlos syndrome   . Fibromyalgia   . Headache(784.0)    Migraine  . Hemorrhoids   . Herpes zoster   . History of syncope   . History of tachycardia-bradycardia syndrome   . History of transient ischemic attack   . Hypercholesteremia   . Hypercholesterolemia   . Hypothyroidism   . Long term (current) use of anticoagulants   . Mitral valve prolapse   . PAT (paroxysmal atrial tachycardia) ([place])   . PSVT (paroxysmal supraventricular tachycardia) ([place])   . Skin desquamation    inflammative vaginitis  . SVT (supraventricular tachycardia) ([facility])     Past Surgical History:  Procedure Laterality Date  . complex mitral valve repair     at the [facility]  . left carotid  endarterectomy  11/2003   by Dr. Delton See  . PACEMAKER INSERTION     medtronic kappa 901  . removed chalazion from left eye lid  09/2011   Dr. Satira Sark    Family History  Problem Relation Age of Onset  . Lung cancer Mother   . Prostate cancer Father   . Alzheimer's disease Father   . Heart disease Father   . Ehlers-Danlos syndrome Father   . Mitral valve prolapse Brother   . Diabetes Sister   . Mitral valve prolapse Sister   . Leukemia Other     Nephew  . Heart disease      entire maternal side of family  . Cancer      entire maternal side of family    Social history:  reports that she has never smoked. She has never used smokeless tobacco. She reports that she does not drink alcohol or use drugs.    Allergies  Allergen Reactions  . Other Other (See Comments)    All QTc prolongation drugs  . Statins Other (See Comments)    myalgias    Medications:  Prior to Admission medications   Medication Sig Start Date End Date Taking? Authorizing Provider  acebutolol (SECTRAL) 200 MG capsule TAKE 1 CAPSULE (200 MG TOTAL) BY MOUTH AT BEDTIME. 05/25/15  Yes Josue Hector, MD  citalopram (CELEXA) 20 MG tablet TAKE 1 TABLET  EVERY DAY 09/06/15  Yes Noralee Space, MD  donepezil (ARICEPT) 10 MG tablet Take 0.5 tablets (5 mg total) by mouth at bedtime. 06/15/15  Yes Kathrynn Ducking, MD  ELIQUIS 5 MG TABS tablet TAKE 1 TABLET BY MOUTH TWICE A DAY 08/17/15  Yes Josue Hector, MD  ezetimibe (ZETIA) 10 MG tablet TAKE 1 TABLET (10 MG TOTAL) BY MOUTH DAILY. 11/22/15  Yes Noralee Space, MD  ipratropium (ATROVENT) 0.06 % nasal spray Place 2 sprays into both nostrils 2 (two) times daily. 12/14/15  Yes Noralee Space, MD  levothyroxine (SYNTHROID, LEVOTHROID) 75 MCG tablet TAKE 1 TABLET BY MOUTH EVERY DAY 06/14/15  Yes Noralee Space, MD  linaclotide [facility]) 72 MCG capsule Take 1 capsule (72 mcg total) by mouth daily before breakfast. 06/16/15  Yes Milus Banister, MD  LORazepam (ATIVAN) 1 MG tablet TAKE  1 TABLET 3 TIMES A DAY AS NEEDED 11/09/15  Yes Noralee Space, MD  Probiotic Product (ALIGN) 4 MG CAPS Take 4 mg by mouth every morning.   Yes Historical Provider, MD  traMADol (ULTRAM) 50 MG tablet Take 1 tablet (50 mg total) by mouth 3 (three) times daily as needed for moderate pain. 11/30/14  Yes Noralee Space, MD    ROS:  Out of a complete 14 system review of symptoms, the patient complains only of the following symptoms, and all other reviewed systems are negative.  Runny nose Light sensitivity   Blood pressure 122/73, pulse 81, height 5' 10.5" (1.791 m), weight 142 lb 8 oz (64.6 kg).  Physical Exam  General: The patient is alert and cooperative at the time of the examination.  Skin: No significant peripheral edema is noted.   Neurologic Exam  Mental status: The patient is alert and oriented x 3 at the time of the examination. The patient has apparent normal recent and remote memory, with an apparently normal attention span and concentration ability. Mini-Mental Status Examination done today shows a total score 28/30.   Cranial nerves: Facial symmetry is present. Speech is normal, no aphasia or dysarthria is noted. Extraocular movements are full. Visual fields are full.  Motor: The patient has good strength in all 4 extremities.  Sensory examination: Soft touch sensation is symmetric on the face, arms, and legs.  Coordination: The patient has good finger-nose-finger and heel-to-shin bilaterally.  Gait and station: The patient has a normal gait. Tandem gait is minimally unsteady. Romberg is negative. No drift is seen.  Reflexes: Deep tendon reflexes are symmetric.   Assessment/Plan:  1. Migraine headache  2. Mild memory disturbance  The patient will continue the Aricept dosing at 5 mg at night. The patient will follow-up in 6 months, sooner if needed.  [name] MD 12/23/2015 10:16 AM  [facility] [address] [place],  [place] [address]  Phone [phone] Fax [phone]

## 2016-02-02 ENCOUNTER — Ambulatory Visit (INDEPENDENT_AMBULATORY_CARE_PROVIDER_SITE_OTHER): Payer: 59 | Admitting: *Deleted

## 2016-02-02 ENCOUNTER — Telehealth: Payer: Self-pay | Admitting: Cardiology

## 2016-02-02 DIAGNOSIS — I495 Sick sinus syndrome: Secondary | ICD-10-CM

## 2016-02-02 NOTE — Progress Notes (Signed)
Remote pacemaker transmission.   

## 2016-02-02 NOTE — Telephone Encounter (Signed)
Spoke with pt and reminded pt of remote transmission that is due today. Pt verbalized understanding.   

## 2016-02-03 ENCOUNTER — Encounter: Payer: Self-pay | Admitting: Cardiology

## 2016-02-03 LAB — CUP PACEART REMOTE DEVICE CHECK
Battery Impedance: 352 Ohm
Battery Remaining Longevity: 101 mo
Battery Voltage: 2.79 V
Brady Statistic AP VP Percent: 0 %
Brady Statistic AP VS Percent: 82 %
Brady Statistic AS VP Percent: 0 %
Brady Statistic AS VS Percent: 17 %
Date Time Interrogation Session: 20180110160656
Implantable Lead Implant Date: 19981106
Implantable Lead Implant Date: 20031031
Implantable Lead Location: 753859
Implantable Lead Location: 753860
Implantable Lead Model: 5076
Implantable Pulse Generator Implant Date: 20110310
Lead Channel Impedance Value: 402 Ohm
Lead Channel Impedance Value: 619 Ohm
Lead Channel Pacing Threshold Amplitude: 0.625 V
Lead Channel Pacing Threshold Amplitude: 0.75 V
Lead Channel Pacing Threshold Pulse Width: 0.4 ms
Lead Channel Pacing Threshold Pulse Width: 0.4 ms
Lead Channel Setting Pacing Amplitude: 2 V
Lead Channel Setting Pacing Amplitude: 2.5 V
Lead Channel Setting Pacing Pulse Width: 0.4 ms
Lead Channel Setting Sensing Sensitivity: 2.8 mV

## 2016-02-18 ENCOUNTER — Other Ambulatory Visit: Payer: Self-pay | Admitting: Cardiovascular Disease

## 2016-02-25 ENCOUNTER — Other Ambulatory Visit: Payer: Self-pay | Admitting: Pulmonary Disease

## 2016-03-12 NOTE — Progress Notes (Signed)
Patient ID: Amy Lane, female   DOB: 22-Sep-1950, 66 y.o.   MRN: [id number] Amy Lane is seen to re-establish with general cardiology  I last saw her in 2013  She has been seeing Dr Caryl Comes   for sinus bradycardia status post pacemaker, status post generator replacement and lead repair March 2011.  She also has a history of Ehlers-Danlos syndrome in the context of a marfanoid habitus, mitral valve prolapse status post mitral valve repair at the [facility] clinic with prior TIA on chronic anticoagulation  as well as syncope due to diagnosis of long QT syndrome. SHe has had significant dysautonomic symptoms consistent with POTS S/P left CEA with duplex 03/18/15 reviewed and 1-39% plaque no stenosis   Husband has had recent battle with prostate cancer  She is active Denies palpitations , syncope recurrent TIA or chest pain   Cognitive defect better on aricept  ? Of MS with LE weakness Unable to do MRI due to pacer Seen by Dr Jannifer Franklin Recent left visual field cut but  Carotids normal.  Aricept cut back due to vivid dreams and night sweats  Wants to go to cardiac rehab.  Going to [place] for river cruise  ROS: Denies fever, malais, weight loss, blurry vision, decreased visual acuity, cough, sputum, SOB, hemoptysis, pleuritic pain, palpitaitons, heartburn, abdominal pain, melena, lower extremity edema, claudication, or rash.  All other systems reviewed and negative  General: Affect appropriate Healthy:  appears stated age 66: normal Neck supple with no adenopathy JVP normal left CEA bruits no thyromegaly Lungs clear with no wheezing and good diaphragmatic motion Heart:  S1/S2 no murmur,rub, gallop or click pacer under left clavicle  PMI normal Abdomen: benighn, BS positve, no tenderness, no AAA no bruit.  No HSM or HJR Distal pulses intact with no bruits No edema Neuro non-focal Skin warm and dry LE weakness    Current Outpatient Prescriptions  Medication Sig Dispense Refill  .  acebutolol (SECTRAL) 200 MG capsule TAKE ONE CAPSULE BY MOUTH AT BEDTIME 90 capsule 1  . apixaban (ELIQUIS) 5 MG TABS tablet Take 1 tablet (5 mg total) by mouth 2 (two) times daily. 180 tablet 1  . citalopram (CELEXA) 20 MG tablet TAKE 1 TABLET EVERY DAY 90 tablet 1  . donepezil (ARICEPT) 10 MG tablet Take 0.5 tablets (5 mg total) by mouth at bedtime.    Marland Kitchen ezetimibe (ZETIA) 10 MG tablet TAKE 1 TABLET EVERY DAY 90 tablet 1  . ipratropium (ATROVENT) 0.06 % nasal spray Place 2 sprays into both nostrils 2 (two) times daily. 15 mL 12  . levothyroxine (SYNTHROID, LEVOTHROID) 75 MCG tablet TAKE 1 TABLET BY MOUTH EVERY DAY 90 tablet 3  . linaclotide (LINZESS) 72 MCG capsule Take 1 capsule (72 mcg total) by mouth daily before breakfast. 30 capsule 11  . LORazepam (ATIVAN) 1 MG tablet TAKE 1 TABLET 3 TIMES A DAY AS NEEDED 90 tablet 5  . Probiotic Product (ALIGN) 4 MG CAPS Take 4 mg by mouth every morning.    . traMADol (ULTRAM) 50 MG tablet Take 1 tablet (50 mg total) by mouth 3 (three) times daily as needed for moderate pain. 50 tablet 0   No current facility-administered medications for this visit.     Allergies  Other and Statins  Electrocardiogram:  Atrial pacing rate 77 somewhat long delay from P wave and atrial spike  ICRBBB normal ventricular complex 03/19/15  A pacing rate 73  RBBB   Assessment and Plan  Pacer:  Reviewed Dr  Kleins last note normal function    POTS:  Improved with no postural symptoms refer to cardiac rehab for monitored exercise   TIA:  CEA patent by duplex  Now on Eliquis with no bleeding issues ? Related to PAF  MVrepair:  No murmur on exam f/u echo SBE  Thyroid:   Lab Results  Component Value Date   TSH 2.08 05/31/2015   Chol:   Lab Results  Component Value Date   LDLCALC 87 05/31/2015   Neuro:  Continue aricept for cognitive dysfunction F//U DR Legrand Pitts

## 2016-03-13 ENCOUNTER — Other Ambulatory Visit: Payer: Self-pay | Admitting: *Deleted

## 2016-03-13 MED ORDER — APIXABAN 5 MG PO TABS: 5.0000 mg | ORAL_TABLET | Freq: Two times a day (BID) | ORAL | 1 refills | Status: DC

## 2016-03-23 ENCOUNTER — Ambulatory Visit (INDEPENDENT_AMBULATORY_CARE_PROVIDER_SITE_OTHER): Payer: 59 | Admitting: Cardiovascular Disease

## 2016-03-23 ENCOUNTER — Encounter: Payer: Self-pay | Admitting: Cardiovascular Disease

## 2016-03-23 VITALS — BP 110/60 | HR 65 | Ht 70.5 in | Wt 141.8 lb

## 2016-03-23 DIAGNOSIS — I059 Rheumatic mitral valve disease, unspecified: Secondary | ICD-10-CM

## 2016-03-23 NOTE — Patient Instructions (Addendum)
Medication Instructions:  Your physician recommends that you continue on your current medications as directed. Please refer to the Current Medication list given to you today.  Labwork: NONE  Testing/Procedures: Your physician has requested that you have an echocardiogram. Echocardiography is a painless test that uses sound waves to create images of your heart. It provides your doctor with information about the size and shape of your heart and how well your heart's chambers and valves are working. This procedure takes approximately one hour. There are no restrictions for this procedure.  Follow-Up: Your physician wants you to follow-up in: 6 months with Dr. Johnsie Cancel. You will receive a reminder letter in the mail two months in advance. If you don't receive a letter, please call our office to schedule the follow-up appointment.  You have been referred to Cardiac Rehab. They will call you with an appointment.   If you need a refill on your cardiac medications before your next appointment, please call your pharmacy.

## 2016-04-03 ENCOUNTER — Telehealth (HOSPITAL_COMMUNITY): Payer: Self-pay | Admitting: Pulmonary Disease

## 2016-04-03 NOTE — Telephone Encounter (Signed)
Verified [employer] insurance benefits. Co-Insurance 10% No Co-Pay, Deductible $350.00, Pt has met $112.34, pt's responsibility is $237.66. Out of Pocket $6000.00 pt has met $ 484.49, Pt's responsibility is $5515.51. [employer] will p/u what [employer] doesn't pay Reference [id number] & [phone]... KJ

## 2016-04-07 ENCOUNTER — Other Ambulatory Visit: Payer: Self-pay

## 2016-04-07 DIAGNOSIS — I951 Orthostatic hypotension: Principal | ICD-10-CM

## 2016-04-07 DIAGNOSIS — G90A Postural orthostatic tachycardia syndrome (POTS): Secondary | ICD-10-CM

## 2016-04-07 DIAGNOSIS — R000 Tachycardia, unspecified: Principal | ICD-10-CM

## 2016-04-07 NOTE — Progress Notes (Unsigned)
Put in order of cardiac rehab for POTS, per Dr. Johnsie Cancel.

## 2016-04-11 ENCOUNTER — Ambulatory Visit (HOSPITAL_COMMUNITY): Payer: 59 | Attending: Cardiology

## 2016-04-11 ENCOUNTER — Other Ambulatory Visit: Payer: Self-pay

## 2016-04-11 DIAGNOSIS — I358 Other nonrheumatic aortic valve disorders: Secondary | ICD-10-CM | POA: Insufficient documentation

## 2016-04-11 DIAGNOSIS — I348 Other nonrheumatic mitral valve disorders: Secondary | ICD-10-CM | POA: Diagnosis not present

## 2016-04-11 DIAGNOSIS — I340 Nonrheumatic mitral (valve) insufficiency: Secondary | ICD-10-CM | POA: Insufficient documentation

## 2016-04-11 DIAGNOSIS — I361 Nonrheumatic tricuspid (valve) insufficiency: Secondary | ICD-10-CM | POA: Insufficient documentation

## 2016-04-11 DIAGNOSIS — I059 Rheumatic mitral valve disease, unspecified: Secondary | ICD-10-CM

## 2016-04-11 DIAGNOSIS — I509 Heart failure, unspecified: Secondary | ICD-10-CM | POA: Diagnosis not present

## 2016-04-11 DIAGNOSIS — I371 Nonrheumatic pulmonary valve insufficiency: Secondary | ICD-10-CM | POA: Diagnosis not present

## 2016-04-12 ENCOUNTER — Telehealth: Payer: Self-pay | Admitting: Cardiovascular Disease

## 2016-04-12 ENCOUNTER — Telehealth (HOSPITAL_COMMUNITY): Payer: Self-pay | Admitting: Pulmonary Disease

## 2016-04-12 NOTE — Telephone Encounter (Signed)
f/u Message   pt returning RN call about results. Please call back to discuss

## 2016-04-12 NOTE — Telephone Encounter (Signed)
S/w Mona she is excited about our CRP and advised per Holland Falling Gae Bon) she is authorized for up to 24 visits for our program. Reference [phone]

## 2016-04-12 NOTE — Telephone Encounter (Signed)
Called patient back with her echo results.

## 2016-04-27 ENCOUNTER — Encounter (HOSPITAL_COMMUNITY): Payer: Self-pay

## 2016-04-27 ENCOUNTER — Encounter (HOSPITAL_COMMUNITY)
Admission: RE | Admit: 2016-04-27 | Discharge: 2016-04-27 | Disposition: A | Discharge status: Home / Self Care | Payer: 59 | Source: Ambulatory Visit | Attending: Cardiovascular Disease | Admitting: Cardiovascular Disease

## 2016-04-27 VITALS — BP 96/64 | HR 61 | Ht 70.5 in | Wt 144.0 lb

## 2016-04-27 DIAGNOSIS — R000 Tachycardia, unspecified: Secondary | ICD-10-CM | POA: Insufficient documentation

## 2016-04-27 DIAGNOSIS — I951 Orthostatic hypotension: Secondary | ICD-10-CM | POA: Diagnosis present

## 2016-04-27 DIAGNOSIS — G90A Postural orthostatic tachycardia syndrome (POTS): Secondary | ICD-10-CM

## 2016-04-27 NOTE — Progress Notes (Signed)
Cardiac Rehab Medication Review by a Pharmacist  Does the patient  feel that his/her medications are working for him/her?  yes  Has the patient been experiencing any side effects to the medications prescribed?  no  Does the patient measure his/her own blood pressure or blood glucose at home?  no   Does the patient have any problems obtaining medications due to transportation or finances?   no  Understanding of regimen: good Understanding of indications: good Potential of compliance: good    Pharmacist comments: Pt presents for initial cardiac rehab appointment. No issues noted. Pt reports not checking BP regularly but states that her BP tends to run low.  Amy Lane, PharmD PGY-1 Pharmacy Resident Pager: [phone] 04/27/2016

## 2016-04-27 NOTE — Progress Notes (Signed)
Cardiac Individual Treatment Plan  Patient Details  Name: Amy Lane MRN: [id number] Date of Birth: [birth year] Referring Provider:     CARDIAC REHAB PHASE II ORIENTATION from 04/27/2016 in [place]  Referring Provider  [name] MD      Initial Encounter Date:    CARDIAC REHAB PHASE II ORIENTATION from 04/27/2016 in [place]  Date  04/27/16  Referring Provider  [name] MD      Visit Diagnosis: POTS (postural orthostatic tachycardia syndrome)  Patient's Home Medications on Admission:  Current Outpatient Prescriptions:  .  acebutolol (SECTRAL) 200 MG capsule, TAKE ONE CAPSULE BY MOUTH AT BEDTIME, Disp: 90 capsule, Rfl: 1 .  apixaban (ELIQUIS) 5 MG TABS tablet, Take 1 tablet (5 mg total) by mouth 2 (two) times daily., Disp: 180 tablet, Rfl: 1 .  citalopram (CELEXA) 20 MG tablet, TAKE 1 TABLET EVERY DAY, Disp: 90 tablet, Rfl: 1 .  donepezil (ARICEPT) 10 MG tablet, Take 0.5 tablets (5 mg total) by mouth at bedtime., Disp: , Rfl:  .  ezetimibe (ZETIA) 10 MG tablet, TAKE 1 TABLET EVERY DAY, Disp: 90 tablet, Rfl: 1 .  ipratropium (ATROVENT) 0.06 % nasal spray, Place 2 sprays into both nostrils 2 (two) times daily., Disp: 15 mL, Rfl: 12 .  levothyroxine (SYNTHROID, LEVOTHROID) 75 MCG tablet, TAKE 1 TABLET BY MOUTH EVERY DAY, Disp: 90 tablet, Rfl: 3 .  linaclotide (LINZESS) 72 MCG capsule, Take 1 capsule (72 mcg total) by mouth daily before breakfast. (Patient taking differently: Take 72 mcg by mouth daily before breakfast. Pt takes 1 tablet 3 times weekly), Disp: 30 capsule, Rfl: 11 .  LORazepam (ATIVAN) 1 MG tablet, TAKE 1 TABLET 3 TIMES A DAY AS NEEDED, Disp: 90 tablet, Rfl: 5 .  Probiotic Product (ALIGN) 4 MG CAPS, Take 4 mg by mouth every morning., Disp: , Rfl:  .  traMADol (ULTRAM) 50 MG tablet, Take 1 tablet (50 mg total) by mouth 3 (three) times daily as needed for moderate pain., Disp: 50 tablet, Rfl:  0  Past Medical History: Past Medical History:  Diagnosis Date  . Abnormal gait   . Allergic rhinitis   . Atrial fibrillation ([place])   . Carcinomas, basal cell   . Cardiac pacemaker    DDD  MDT  . Cerebrovascular disease   . Depression   . Dysautonomia   . Ehler's-Danlos syndrome   . Fibromyalgia   . Headache(784.0)    Migraine  . Hemorrhoids   . Herpes zoster   . History of syncope   . History of tachycardia-bradycardia syndrome   . History of transient ischemic attack   . Hypercholesteremia   . Hypercholesterolemia   . Hypothyroidism   . Long term (current) use of anticoagulants   . Mitral valve prolapse   . PAT (paroxysmal atrial tachycardia) ([place])   . PSVT (paroxysmal supraventricular tachycardia) ([place])   . Skin desquamation    inflammative vaginitis  . SVT (supraventricular tachycardia) ([facility])     Tobacco Use: History  Smoking Status  . Never Smoker  Smokeless Tobacco  . Never Used    Comment: smoked in her early 20's--smoked 4 cigs per year     Labs: Recent Review Flowsheet Data    Labs for ITP Cardiac and Pulmonary Rehab Latest Ref Rng & Units 01/28/2013 01/29/2013 04/18/2013 04/06/2014 05/31/2015   Cholestrol 0 - 200 mg/dL - 235(H) 172 167 168   LDLCALC 0 - 99 mg/dL -  155(H) 89 93 87   LDLDIRECT mg/dL - - - - -   HDL >39.00 mg/dL - 64 68.70 60.60 59.60   Trlycerides 0.0 - 149.0 mg/dL - 79 74.0 66.0 105.0   Hemoglobin A1c <5.7 % - 6.1(H) - - -   TCO2 0 - 100 mmol/L 22 - - - -      Capillary Blood Glucose: No results found for: GLUCAP   Exercise Target Goals: Date: 04/27/16  Exercise Program Goal: Individual exercise prescription set with THRR, safety & activity barriers. Participant demonstrates ability to understand and report RPE using BORG scale, to self-measure pulse accurately, and to acknowledge the importance of the exercise prescription.  Exercise Prescription Goal: Starting with aerobic activity 30 plus minutes a day, 3 days per week for  initial exercise prescription. Provide home exercise prescription and guidelines that participant acknowledges understanding prior to discharge.  Activity Barriers & Risk Stratification:     Activity Barriers & Cardiac Risk Stratification - 04/27/16 0827      Activity Barriers & Cardiac Risk Stratification   Activity Barriers Arthritis;Deconditioning;Joint Problems;Other (comment);Chest Pain/Angina   Comments L knee complications, broke L foot, arthritis in R hip and neck region   Cardiac Risk Stratification High      6 Minute Walk:     6 Minute Walk    Row Name 04/27/16 1130         6 Minute Walk   Phase Initial     Distance 1621 feet     Walk Time 6 minutes     # of Rest Breaks 0     MPH 3.07     METS 4.42     RPE 11     VO2 Peak 15.49     Symptoms No     Resting HR 61 bpm     Resting BP 96/64     Max Ex. HR 115 bpm     Max Ex. BP 122/64     2 Minute Post BP 110/64        Oxygen Initial Assessment:   Oxygen Re-Evaluation:   Oxygen Discharge (Final Oxygen Re-Evaluation):   Initial Exercise Prescription:     Initial Exercise Prescription - 04/27/16 1100      Date of Initial Exercise RX and Referring Provider   Date 04/27/16   Referring Provider [name] MD     Treadmill   MPH 2.6   Grade 0   Minutes 10   METs 2.99     Recumbant Bike   Level 2   Minutes 10   METs 2     NuStep   Level 3   SPM 80   Minutes 10   METs 2     Prescription Details   Frequency (times per week) 3   Duration Progress to 30 minutes of continuous aerobic without signs/symptoms of physical distress     Intensity   THRR 40-80% of Max Heartrate 63-126   Ratings of Perceived Exertion 11-15   Perceived Dyspnea 0-4     Progression   Progression Continue to progress workloads to maintain intensity without signs/symptoms of physical distress.     Resistance Training   Training Prescription Yes   Weight 2lbs   Reps 10-15      Perform Capillary Blood  Glucose checks as needed.  Exercise Prescription Changes:   Exercise Comments:   Exercise Goals and Review:     Exercise Goals    Row Name 04/27/16 [phone]  Exercise Goals   Increase Physical Activity Yes  Be able to climb stairs and be active for 30 minutes without symptoms of SOB       Intervention Provide advice, education, support and counseling about physical activity/exercise needs.;Develop an individualized exercise prescription for aerobic and resistive training based on initial evaluation findings, risk stratification, comorbidities and participant's personal goals.       Expected Outcomes Achievement of increased cardiorespiratory fitness and enhanced flexibility, muscular endurance and strength shown through measurements of functional capacity and personal statement of participant.       Increase Strength and Stamina Yes  Be able to walk or slightly jog for 30 minutes or more without symptoms of SOB       Intervention Provide advice, education, support and counseling about physical activity/exercise needs.;Develop an individualized exercise prescription for aerobic and resistive training based on initial evaluation findings, risk stratification, comorbidities and participant's personal goals.       Expected Outcomes Achievement of increased cardiorespiratory fitness and enhanced flexibility, muscular endurance and strength shown through measurements of functional capacity and personal statement of participant.          Exercise Goals Re-Evaluation :    Discharge Exercise Prescription (Final Exercise Prescription Changes):   Nutrition:  Target Goals: Understanding of nutrition guidelines, daily intake of sodium 1500mg , cholesterol 200mg , calories 30% from fat and 7% or less from saturated fats, daily to have 5 or more servings of fruits and vegetables.  Biometrics:     Pre Biometrics - 04/27/16 1139      Pre Biometrics   Waist Circumference 27.5  inches   Hip Circumference 39 inches   Waist to Hip Ratio 0.71 %   Triceps Skinfold 23 mm   % Body Fat 30.5 %   Grip Strength 29.5 kg   Flexibility 15 in   Single Leg Stand 30 seconds       Nutrition Therapy Plan and Nutrition Goals:   Nutrition Discharge: Nutrition Scores:   Nutrition Goals Re-Evaluation:   Nutrition Goals Re-Evaluation:   Nutrition Goals Discharge (Final Nutrition Goals Re-Evaluation):   Psychosocial: Target Goals: Acknowledge presence or absence of significant depression and/or stress, maximize coping skills, provide positive support system. Participant is able to verbalize types and ability to use techniques and skills needed for reducing stress and depression.  Initial Review & Psychosocial Screening:     Initial Psych Review & Screening - 04/27/16 1219      Initial Review   Current issues with None Identified     Family Dynamics   Good Support System? Yes     Barriers   Psychosocial barriers to participate in program There are no identifiable barriers or psychosocial needs.     Screening Interventions   Interventions Encouraged to exercise      Quality of Life Scores:     Quality of Life - 04/27/16 1150      Quality of Life Scores   Health/Function Pre 27.9 %   Socioeconomic Pre 30 %   Psych/Spiritual Pre 30 %   Family Pre 28.5 %   GLOBAL Pre 28.86 %      PHQ-9: Recent Review Flowsheet Data    There is no flowsheet data to display.     Interpretation of Total Score  Total Score Depression Severity:  1-4 = Minimal depression, 5-9 = Mild depression, 10-14 = Moderate depression, 15-19 = Moderately severe depression, 20-27 = Severe depression   Psychosocial Evaluation and Intervention:   Psychosocial Re-Evaluation:  Psychosocial Discharge (Final Psychosocial Re-Evaluation):   Vocational Rehabilitation: Provide vocational rehab assistance to qualifying candidates.   Vocational Rehab Evaluation & Intervention:      Vocational Rehab - 04/27/16 1218      Initial Vocational Rehab Evaluation & Intervention   Assessment shows need for Vocational Rehabilitation --  Mrs Eppes is retired and does not need vocational rehab at this time.      Education: Education Goals: Education classes will be provided on a weekly basis, covering required topics. Participant will state understanding/return demonstration of topics presented.  Learning Barriers/Preferences:     Learning Barriers/Preferences - 04/27/16 0826      Learning Barriers/Preferences   Learning Barriers Sight   Learning Preferences Video;Skilled Demonstration;Pictoral      Education Topics: Count Your Pulse:  -Group instruction provided by verbal instruction, demonstration, patient participation and written materials to support subject.  Instructors address importance of being able to find your pulse and how to count your pulse when at home without a heart monitor.  Patients get hands on experience counting their pulse with staff help and individually.   Heart Attack, Angina, and Risk Factor Modification:  -Group instruction provided by verbal instruction, video, and written materials to support subject.  Instructors address signs and symptoms of angina and heart attacks.    Also discuss risk factors for heart disease and how to make changes to improve heart health risk factors.   Functional Fitness:  -Group instruction provided by verbal instruction, demonstration, patient participation, and written materials to support subject.  Instructors address safety measures for doing things around the house.  Discuss how to get up and down off the floor, how to pick things up properly, how to safely get out of a chair without assistance, and balance training.   Meditation and Mindfulness:  -Group instruction provided by verbal instruction, patient participation, and written materials to support subject.  Instructor addresses importance of  mindfulness and meditation practice to help reduce stress and improve awareness.  Instructor also leads participants through a meditation exercise.    Stretching for Flexibility and Mobility:  -Group instruction provided by verbal instruction, patient participation, and written materials to support subject.  Instructors lead participants through series of stretches that are designed to increase flexibility thus improving mobility.  These stretches are additional exercise for major muscle groups that are typically performed during regular warm up and cool down.   Hands Only CPR Anytime:  -Group instruction provided by verbal instruction, video, patient participation and written materials to support subject.  Instructors co-teach with AHA video for hands only CPR.  Participants get hands on experience with mannequins.   Nutrition I class: Heart Healthy Eating:  -Group instruction provided by PowerPoint slides, verbal discussion, and written materials to support subject matter. The instructor gives an explanation and review of the Therapeutic Lifestyle Changes diet recommendations, which includes a discussion on lipid goals, dietary fat, sodium, fiber, plant stanol/sterol esters, sugar, and the components of a well-balanced, healthy diet.   Nutrition II class: Lifestyle Skills:  -Group instruction provided by PowerPoint slides, verbal discussion, and written materials to support subject matter. The instructor gives an explanation and review of label reading, grocery shopping for heart health, heart healthy recipe modifications, and ways to make healthier choices when eating out.   Diabetes Question & Answer:  -Group instruction provided by PowerPoint slides, verbal discussion, and written materials to support subject matter. The instructor gives an explanation and review of diabetes co-morbidities, pre- and post-prandial blood  glucose goals, pre-exercise blood glucose goals, signs, symptoms, and  treatment of hypoglycemia and hyperglycemia, and foot care basics.   Diabetes Blitz:  -Group instruction provided by PowerPoint slides, verbal discussion, and written materials to support subject matter. The instructor gives an explanation and review of the physiology behind type 1 and type 2 diabetes, diabetes medications and rational behind using different medications, pre- and post-prandial blood glucose recommendations and Hemoglobin A1c goals, diabetes diet, and exercise including blood glucose guidelines for exercising safely.    Portion Distortion:  -Group instruction provided by PowerPoint slides, verbal discussion, written materials, and food models to support subject matter. The instructor gives an explanation of serving size versus portion size, changes in portions sizes over the last 20 years, and what consists of a serving from each food group.   Stress Management:  -Group instruction provided by verbal instruction, video, and written materials to support subject matter.  Instructors review role of stress in heart disease and how to cope with stress positively.     Exercising on Your Own:  -Group instruction provided by verbal instruction, power point, and written materials to support subject.  Instructors discuss benefits of exercise, components of exercise, frequency and intensity of exercise, and end points for exercise.  Also discuss use of nitroglycerin and activating EMS.  Review options of places to exercise outside of rehab.  Review guidelines for sex with heart disease.   Cardiac Drugs I:  -Group instruction provided by verbal instruction and written materials to support subject.  Instructor reviews cardiac drug classes: antiplatelets, anticoagulants, beta blockers, and statins.  Instructor discusses reasons, side effects, and lifestyle considerations for each drug class.   Cardiac Drugs II:  -Group instruction provided by verbal instruction and written materials to  support subject.  Instructor reviews cardiac drug classes: angiotensin converting enzyme inhibitors (ACE-I), angiotensin II receptor blockers (ARBs), nitrates, and calcium channel blockers.  Instructor discusses reasons, side effects, and lifestyle considerations for each drug class.   Anatomy and Physiology of the Circulatory System:  -Group instruction provided by verbal instruction, video, and written materials to support subject.  Reviews functional anatomy of heart, how it relates to various diagnoses, and what role the heart plays in the overall system.   Knowledge Questionnaire Score:     Knowledge Questionnaire Score - 04/27/16 1130      Knowledge Questionnaire Score   Pre Score 21/24      Core Components/Risk Factors/Patient Goals at Admission:     Personal Goals and Risk Factors at Admission - 04/27/16 1107      Core Components/Risk Factors/Patient Goals on Admission    Weight Management Yes;Weight Loss;Weight Maintenance   Intervention Weight Management: Develop a combined nutrition and exercise program designed to reach desired caloric intake, while maintaining appropriate intake of nutrient and fiber, sodium and fats, and appropriate energy expenditure required for the weight goal.;Weight Management: Provide education and appropriate resources to help participant work on and attain dietary goals.;Weight Management/Obesity: Establish reasonable short term and long term weight goals.   Expected Outcomes Long Term: Adherence to nutrition and physical activity/exercise program aimed toward attainment of established weight goal;Short Term: Continue to assess and modify interventions until short term weight is achieved;Weight Maintenance: Understanding of the daily nutrition guidelines, which includes 25-35% calories from fat, 7% or less cal from saturated fats, less than 200mg  cholesterol, less than 1.5gm of sodium, & 5 or more servings of fruits and vegetables daily;Weight Loss:  Understanding of general recommendations for a balanced deficit  meal plan, which promotes 1-2 lb weight loss per week and includes a negative energy balance of [phone] kcal/d;Understanding recommendations for meals to include 15-35% energy as protein, 25-35% energy from fat, 35-60% energy from carbohydrates, less than 200mg  of dietary cholesterol, 20-35 gm of total fiber daily;Understanding of distribution of calorie intake throughout the day with the consumption of 4-5 meals/snacks   Lipids Yes   Intervention Provide education and support for participant on nutrition & aerobic/resistive exercise along with prescribed medications to achieve LDL 70mg , HDL >40mg .   Expected Outcomes Short Term: Participant states understanding of desired cholesterol values and is compliant with medications prescribed. Participant is following exercise prescription and nutrition guidelines.;Long Term: Cholesterol controlled with medications as prescribed, with individualized exercise RX and with personalized nutrition plan. Value goals: LDL < 70mg , HDL > 40 mg.      Core Components/Risk Factors/Patient Goals Review:    Core Components/Risk Factors/Patient Goals at Discharge (Final Review):    ITP Comments:     ITP Comments    Row Name 04/27/16 [phone]           ITP Comments Medical Director, Dr. Fransico Him          Comments: Amy Lane attended orientation from 0800 to 1030 to review rules and guidelines for program. Completed 6 minute walk test, Intitial ITP, and exercise prescription.   Telemetry-Atrial Paced .  Asymptomatic. Initial blood pressure 96/60. Amy Lane completed her walk test without difficulty or complaints.Barnet Pall, RN,BSN 04/27/2016 12:31 PM

## 2016-05-01 ENCOUNTER — Encounter (HOSPITAL_COMMUNITY)
Admission: RE | Admit: 2016-05-01 | Discharge: 2016-05-01 | Disposition: A | Discharge status: Home / Self Care | Payer: 59 | Source: Ambulatory Visit | Attending: Cardiovascular Disease | Admitting: Cardiovascular Disease

## 2016-05-01 DIAGNOSIS — G90A Postural orthostatic tachycardia syndrome (POTS): Secondary | ICD-10-CM

## 2016-05-01 DIAGNOSIS — R000 Tachycardia, unspecified: Secondary | ICD-10-CM | POA: Diagnosis not present

## 2016-05-01 DIAGNOSIS — I951 Orthostatic hypotension: Principal | ICD-10-CM

## 2016-05-01 NOTE — Progress Notes (Signed)
Daily Session Note  Patient Details  Name: Amy Lane MRN: [id number] Date of Birth: Nov 06, 1950 Referring Provider:     CARDIAC REHAB PHASE II ORIENTATION from 04/27/2016 in [place]  Referring Provider  [name] MD      Encounter Date: 05/01/2016  Check In:     Session Check In - 05/01/16 1126      Check-In   Location [facility]   Staff Present Maurice Small, RN, BSN;Amber Fair, MS, ACSM RCEP, Exercise Physiologist;Joann Rion, RN, BSN   Supervising physician immediately available to respond to emergencies [facility] immediately available   Physician(s) Dr. Ree Kida    Medication changes reported     No   Fall or balance concerns reported    No   Tobacco Cessation No Change   Warm-up and Cool-down Performed as group-led instruction   Resistance Training Performed Yes   VAD Patient? No     Pain Assessment   Currently in Pain? No/denies      Capillary Blood Glucose: No results found for this or any previous visit (from the past 24 hour(s)).    History  Smoking Status  . Never Smoker  Smokeless Tobacco  . Never Used    Comment: smoked in her early 20's--smoked 4 cigs per year     Goals Met:  Exercise tolerated well Personal goals reviewed No report of cardiac concerns or symptoms  Goals Unmet:  Not Applicable  Comments:  Pt started full exercise at cardiac rehab today.  Pt tolerated light exercise without difficulty. VSS, telemetry-mostly atrial paced, asymptomatic.  Medication list reconciled. Pt denies barriers to medication compliance.  PSYCHOSOCIAL ASSESSMENT:  PHQ-0. Pt exhibits positive coping skills, hopeful outlook with supportive family. Pt completed pre Quality of Life survey.  Pt scored the following      Quality of Life - 04/27/16 1150      Quality of Life Scores   Health/Function Pre 27.9 %   Socioeconomic Pre 30 %   Psych/Spiritual Pre 30 %   Family Pre 28.5 %   GLOBAL  Pre 28.86 %     No psychosocial needs identified at this time, no psychosocial interventions necessary.  Pt enjoys traveling with her husband. Pt desires to be able to walk or slightly jog. Pt did not jog before her cardiac event but feels if she is able to jog - she is considered to be "fit".   Pt long term goal is to be more physically fit and be able to climb stairs without sob.  Pt oriented to exercise equipment and routine.   Understanding verbalized. Maurice Small RN, BSN Cardiac and Pulmonary Rehab Nurse Navigator     Dr. Fransico Him is Medical Director for Cardiac Rehab at [facility]

## 2016-05-03 ENCOUNTER — Encounter (HOSPITAL_COMMUNITY)
Admission: RE | Admit: 2016-05-03 | Discharge: 2016-05-03 | Disposition: A | Discharge status: Home / Self Care | Payer: 59 | Source: Ambulatory Visit | Attending: Cardiovascular Disease | Admitting: Cardiovascular Disease

## 2016-05-03 ENCOUNTER — Ambulatory Visit (INDEPENDENT_AMBULATORY_CARE_PROVIDER_SITE_OTHER): Payer: 59 | Admitting: *Deleted

## 2016-05-03 DIAGNOSIS — R000 Tachycardia, unspecified: Principal | ICD-10-CM

## 2016-05-03 DIAGNOSIS — I495 Sick sinus syndrome: Secondary | ICD-10-CM | POA: Diagnosis not present

## 2016-05-03 DIAGNOSIS — G90A Postural orthostatic tachycardia syndrome (POTS): Secondary | ICD-10-CM

## 2016-05-03 DIAGNOSIS — I951 Orthostatic hypotension: Principal | ICD-10-CM

## 2016-05-03 NOTE — Progress Notes (Signed)
Remote pacemaker transmission.   

## 2016-05-04 ENCOUNTER — Encounter: Payer: Self-pay | Admitting: Cardiology

## 2016-05-05 ENCOUNTER — Encounter (HOSPITAL_COMMUNITY): Payer: 59

## 2016-05-05 LAB — CUP PACEART REMOTE DEVICE CHECK
Battery Remaining Longevity: 100 mo
Battery Voltage: 2.79 V
Brady Statistic AP VP Percent: 0 %
Date Time Interrogation Session: 20180411125453
Implantable Lead Location: 753859
Implantable Pulse Generator Implant Date: 20110310
Lead Channel Impedance Value: 424 Ohm
Lead Channel Pacing Threshold Amplitude: 0.5 V
Lead Channel Pacing Threshold Amplitude: 1.125 V
Lead Channel Pacing Threshold Pulse Width: 0.4 ms
Lead Channel Setting Pacing Amplitude: 2 V
Lead Channel Setting Pacing Pulse Width: 0.4 ms
Lead Channel Setting Sensing Sensitivity: 2.8 mV
MDC IDC LEAD IMPLANT DT: 19981106
MDC IDC LEAD IMPLANT DT: 20031031
MDC IDC LEAD LOCATION: 753860
MDC IDC MSMT BATTERY IMPEDANCE: 376 Ohm
MDC IDC MSMT LEADCHNL RA PACING THRESHOLD PULSEWIDTH: 0.4 ms
MDC IDC MSMT LEADCHNL RV IMPEDANCE VALUE: 605 Ohm
MDC IDC SET LEADCHNL RV PACING AMPLITUDE: 2.5 V
MDC IDC STAT BRADY AP VS PERCENT: 83 %
MDC IDC STAT BRADY AS VP PERCENT: 0 %
MDC IDC STAT BRADY AS VS PERCENT: 16 %

## 2016-05-08 ENCOUNTER — Encounter (HOSPITAL_COMMUNITY)
Admission: RE | Admit: 2016-05-08 | Discharge: 2016-05-08 | Disposition: A | Discharge status: Home / Self Care | Payer: 59 | Source: Ambulatory Visit | Attending: Cardiovascular Disease | Admitting: Cardiovascular Disease

## 2016-05-08 DIAGNOSIS — R000 Tachycardia, unspecified: Secondary | ICD-10-CM | POA: Diagnosis not present

## 2016-05-08 DIAGNOSIS — I951 Orthostatic hypotension: Principal | ICD-10-CM

## 2016-05-08 DIAGNOSIS — G90A Postural orthostatic tachycardia syndrome (POTS): Secondary | ICD-10-CM

## 2016-05-10 ENCOUNTER — Encounter (HOSPITAL_COMMUNITY)
Admission: RE | Admit: 2016-05-10 | Discharge: 2016-05-10 | Disposition: A | Discharge status: Home / Self Care | Payer: 59 | Source: Ambulatory Visit | Attending: Cardiovascular Disease | Admitting: Cardiovascular Disease

## 2016-05-10 DIAGNOSIS — G90A Postural orthostatic tachycardia syndrome (POTS): Secondary | ICD-10-CM

## 2016-05-10 DIAGNOSIS — R000 Tachycardia, unspecified: Secondary | ICD-10-CM | POA: Diagnosis not present

## 2016-05-10 DIAGNOSIS — I951 Orthostatic hypotension: Principal | ICD-10-CM

## 2016-05-12 ENCOUNTER — Encounter (HOSPITAL_COMMUNITY)
Admission: RE | Admit: 2016-05-12 | Discharge: 2016-05-12 | Disposition: A | Discharge status: Home / Self Care | Payer: 59 | Source: Ambulatory Visit | Attending: Cardiovascular Disease | Admitting: Cardiovascular Disease

## 2016-05-12 DIAGNOSIS — G90A Postural orthostatic tachycardia syndrome (POTS): Secondary | ICD-10-CM

## 2016-05-12 DIAGNOSIS — R000 Tachycardia, unspecified: Principal | ICD-10-CM

## 2016-05-12 DIAGNOSIS — I951 Orthostatic hypotension: Principal | ICD-10-CM

## 2016-05-12 NOTE — Progress Notes (Signed)
Reviewed home exercise guidelines with patient including endpoints, temperature precautions, target heart rate and rate of perceived exertion. Pt is currently walking 12 minutes on Tuesday, Thursday, and Saturday as her mode of home exercise. Pt will increase walking program to 12 minutes twice a day on those days and increase a minute each session with the goal of achieving 15 minutes twice a day 3 days per week in addition to her cardiac rehab sessions. Pt voices understanding of instructions given.  Sol Passer, MS, ACSM CEP

## 2016-05-15 ENCOUNTER — Encounter (HOSPITAL_COMMUNITY)
Admission: RE | Admit: 2016-05-15 | Discharge: 2016-05-15 | Disposition: A | Discharge status: Home / Self Care | Payer: 59 | Source: Ambulatory Visit | Attending: Cardiovascular Disease | Admitting: Cardiovascular Disease

## 2016-05-15 DIAGNOSIS — R000 Tachycardia, unspecified: Principal | ICD-10-CM

## 2016-05-15 DIAGNOSIS — I951 Orthostatic hypotension: Principal | ICD-10-CM

## 2016-05-15 DIAGNOSIS — G90A Postural orthostatic tachycardia syndrome (POTS): Secondary | ICD-10-CM

## 2016-05-17 ENCOUNTER — Encounter (HOSPITAL_COMMUNITY)
Admission: RE | Admit: 2016-05-17 | Discharge: 2016-05-17 | Disposition: A | Discharge status: Home / Self Care | Payer: 59 | Source: Ambulatory Visit | Attending: Cardiovascular Disease | Admitting: Cardiovascular Disease

## 2016-05-17 DIAGNOSIS — I951 Orthostatic hypotension: Principal | ICD-10-CM

## 2016-05-17 DIAGNOSIS — R000 Tachycardia, unspecified: Principal | ICD-10-CM

## 2016-05-17 DIAGNOSIS — G90A Postural orthostatic tachycardia syndrome (POTS): Secondary | ICD-10-CM

## 2016-05-17 NOTE — Progress Notes (Signed)
Cardiac Individual Treatment Plan  Patient Details  Name: Amy Lane MRN: [id number] Date of Birth: Mar 21, 1950 Referring Provider:     CARDIAC REHAB PHASE II ORIENTATION from 04/27/2016 in [facility]  Referring Provider  [name] MD      Initial Encounter Date:    CARDIAC REHAB PHASE II ORIENTATION from 04/27/2016 in [facility]  Date  04/27/16  Referring Provider  [name] MD      Visit Diagnosis: POTS (postural orthostatic tachycardia syndrome)  Patient's Home Medications on Admission:  Current Outpatient Prescriptions:  .  acebutolol (SECTRAL) 200 MG capsule, TAKE ONE CAPSULE BY MOUTH AT BEDTIME, Disp: 90 capsule, Rfl: 1 .  apixaban (ELIQUIS) 5 MG TABS tablet, Take 1 tablet (5 mg total) by mouth 2 (two) times daily., Disp: 180 tablet, Rfl: 1 .  citalopram (CELEXA) 20 MG tablet, TAKE 1 TABLET EVERY DAY, Disp: 90 tablet, Rfl: 1 .  donepezil (ARICEPT) 10 MG tablet, Take 0.5 tablets (5 mg total) by mouth at bedtime., Disp: , Rfl:  .  ezetimibe (ZETIA) 10 MG tablet, TAKE 1 TABLET EVERY DAY, Disp: 90 tablet, Rfl: 1 .  ipratropium (ATROVENT) 0.06 % nasal spray, Place 2 sprays into both nostrils 2 (two) times daily., Disp: 15 mL, Rfl: 12 .  levothyroxine (SYNTHROID, LEVOTHROID) 75 MCG tablet, TAKE 1 TABLET BY MOUTH EVERY DAY, Disp: 90 tablet, Rfl: 3 .  linaclotide (LINZESS) 72 MCG capsule, Take 1 capsule (72 mcg total) by mouth daily before breakfast. (Patient taking differently: Take 72 mcg by mouth daily before breakfast. Pt takes 1 tablet 3 times weekly), Disp: 30 capsule, Rfl: 11 .  LORazepam (ATIVAN) 1 MG tablet, TAKE 1 TABLET 3 TIMES A DAY AS NEEDED (Patient taking differently: TAKE 1 TABLET 3 TIMES A DAY AS NEEDED Pt reports taking this at night), Disp: 90 tablet, Rfl: 5 .  Probiotic Product (ALIGN) 4 MG CAPS, Take 4 mg by mouth every morning., Disp: , Rfl:  .  traMADol (ULTRAM) 50 MG tablet, Take 1 tablet  (50 mg total) by mouth 3 (three) times daily as needed for moderate pain., Disp: 50 tablet, Rfl: 0  Past Medical History: Past Medical History:  Diagnosis Date  . Abnormal gait   . Allergic rhinitis   . Atrial fibrillation ([place])   . Carcinomas, basal cell   . Cardiac pacemaker    DDD  MDT  . Cerebrovascular disease   . Depression   . Dysautonomia   . Ehler's-Danlos syndrome   . Fibromyalgia   . Headache(784.0)    Migraine  . Hemorrhoids   . Herpes zoster   . History of syncope   . History of tachycardia-bradycardia syndrome   . History of transient ischemic attack   . Hypercholesteremia   . Hypercholesterolemia   . Hypothyroidism   . Long term (current) use of anticoagulants   . Mitral valve prolapse   . PAT (paroxysmal atrial tachycardia) ([place])   . PSVT (paroxysmal supraventricular tachycardia) ([place])   . Skin desquamation    inflammative vaginitis  . SVT (supraventricular tachycardia) ([facility])     Tobacco Use: History  Smoking Status  . Never Smoker  Smokeless Tobacco  . Never Used    Comment: smoked in her early 20's--smoked 4 cigs per year     Labs: Recent Review Flowsheet Data    Labs for ITP Cardiac and Pulmonary Rehab Latest Ref Rng & Units 01/28/2013 01/29/2013 04/18/2013 04/06/2014 05/31/2015  Cholestrol 0 - 200 mg/dL - 235(H) 172 167 168   LDLCALC 0 - 99 mg/dL - 155(H) 89 93 87   LDLDIRECT mg/dL - - - - -   HDL >39.00 mg/dL - 64 68.70 60.60 59.60   Trlycerides 0.0 - 149.0 mg/dL - 79 74.0 66.0 105.0   Hemoglobin A1c <5.7 % - 6.1(H) - - -   TCO2 0 - 100 mmol/L 22 - - - -      Capillary Blood Glucose: No results found for: GLUCAP   Exercise Target Goals:    Exercise Program Goal: Individual exercise prescription set with THRR, safety & activity barriers. Participant demonstrates ability to understand and report RPE using BORG scale, to self-measure pulse accurately, and to acknowledge the importance of the exercise prescription.  Exercise Prescription  Goal: Starting with aerobic activity 30 plus minutes a day, 3 days per week for initial exercise prescription. Provide home exercise prescription and guidelines that participant acknowledges understanding prior to discharge.  Activity Barriers & Risk Stratification:     Activity Barriers & Cardiac Risk Stratification - 04/27/16 0827      Activity Barriers & Cardiac Risk Stratification   Activity Barriers Arthritis;Deconditioning;Joint Problems;Other (comment);Chest Pain/Angina   Comments L knee complications, broke L foot, arthritis in R hip and neck region   Cardiac Risk Stratification High      6 Minute Walk:     6 Minute Walk    Row Name 04/27/16 1130         6 Minute Walk   Phase Initial     Distance 1621 feet     Walk Time 6 minutes     # of Rest Breaks 0     MPH 3.07     METS 4.42     RPE 11     VO2 Peak 15.49     Symptoms No     Resting HR 61 bpm     Resting BP 96/64     Max Ex. HR 115 bpm     Max Ex. BP 122/64     2 Minute Post BP 110/64        Oxygen Initial Assessment:   Oxygen Re-Evaluation:   Oxygen Discharge (Final Oxygen Re-Evaluation):   Initial Exercise Prescription:     Initial Exercise Prescription - 04/27/16 1100      Date of Initial Exercise RX and Referring Provider   Date 04/27/16   Referring Provider [name] MD     Treadmill   MPH 2.6   Grade 0   Minutes 10   METs 2.99     Recumbant Bike   Level 2   Minutes 10   METs 2     NuStep   Level 3   SPM 80   Minutes 10   METs 2     Prescription Details   Frequency (times per week) 3   Duration Progress to 30 minutes of continuous aerobic without signs/symptoms of physical distress     Intensity   THRR 40-80% of Max Heartrate 63-126   Ratings of Perceived Exertion 11-15   Perceived Dyspnea 0-4     Progression   Progression Continue to progress workloads to maintain intensity without signs/symptoms of physical distress.     Resistance Training   Training  Prescription Yes   Weight 2lbs   Reps 10-15      Perform Capillary Blood Glucose checks as needed.  Exercise Prescription Changes:      Exercise Prescription Changes  [place] Name 05/15/16 1600             Response to Exercise   Blood Pressure (Admit) 104/60       Blood Pressure (Exercise) 108/72       Blood Pressure (Exit) 90/56  asymptomatic BP recheck 100/60       Heart Rate (Admit) 71 bpm       Heart Rate (Exercise) 100 bpm       Heart Rate (Exit) 68 bpm       Rating of Perceived Exertion (Exercise) 13       Symptoms none       Comments pt c/o of knee pain on Nustep, will modify equipment for comfort       Duration Continue with 30 min of aerobic exercise without signs/symptoms of physical distress.       Intensity THRR unchanged         Resistance Training   Training Prescription Yes       Weight 3lbs       Reps 10-15       Time 10 Minutes         Treadmill   MPH 3       Grade 2       Minutes 10       METs 4.12         Recumbant Bike   Level 2.5       Minutes 10       METs 3         NuStep   Level 3       SPM 80       Minutes 10       METs 2.7         Home Exercise Plan   Plans to continue exercise at Home (comment)       Frequency Add 2 additional days to program exercise sessions.       Initial Home Exercises Provided 05/12/16          Exercise Comments:      Exercise Comments    Row Name 05/12/16 1516           Exercise Comments Home exercise guidelines reviewed with participant who is currently walking 12 minutes, 3 days a week at home (T,Th, Sat). Encouraged participant to increase duration to 12 minutes twice a day and increase by a minute each session with a goal of reaching 15 minutes twice a day, on the days she doesn't attend cardiac reahb, and participant is amenable to this.          Exercise Goals and Review:      Exercise Goals    Row Name 04/27/16 [phone]             Exercise Goals   Increase Physical Activity Yes   Be able to climb stairs and be active for 30 minutes without symptoms of SOB       Intervention Provide advice, education, support and counseling about physical activity/exercise needs.;Develop an individualized exercise prescription for aerobic and resistive training based on initial evaluation findings, risk stratification, comorbidities and participant's personal goals.       Expected Outcomes Achievement of increased cardiorespiratory fitness and enhanced flexibility, muscular endurance and strength shown through measurements of functional capacity and personal statement of participant.       Increase Strength and Stamina Yes  Be able to walk or slightly jog for 30 minutes or more without symptoms of SOB  Intervention Provide advice, education, support and counseling about physical activity/exercise needs.;Develop an individualized exercise prescription for aerobic and resistive training based on initial evaluation findings, risk stratification, comorbidities and participant's personal goals.       Expected Outcomes Achievement of increased cardiorespiratory fitness and enhanced flexibility, muscular endurance and strength shown through measurements of functional capacity and personal statement of participant.          Exercise Goals Re-Evaluation :     Exercise Goals Re-Evaluation    Row Name 05/15/16 1655             Exercise Goal Re-Evaluation   Exercise Goals Review Increase Physical Activity       Comments Pt will increase walking program to 12 minutes twice a day on those days and increase a minute each session with the goal of achieving 15 minutes twice a day 3 days per week in addition to her cardiac rehab sessions.        Expected Outcomes Pt will be compliant with HEP and continue to progress in cardiorespiratory fitness           Discharge Exercise Prescription (Final Exercise Prescription Changes):     Exercise Prescription Changes - 05/15/16 1600      Response  to Exercise   Blood Pressure (Admit) 104/60   Blood Pressure (Exercise) 108/72   Blood Pressure (Exit) 90/56  asymptomatic BP recheck 100/60   Heart Rate (Admit) 71 bpm   Heart Rate (Exercise) 100 bpm   Heart Rate (Exit) 68 bpm   Rating of Perceived Exertion (Exercise) 13   Symptoms none   Comments pt c/o of knee pain on Nustep, will modify equipment for comfort   Duration Continue with 30 min of aerobic exercise without signs/symptoms of physical distress.   Intensity THRR unchanged     Resistance Training   Training Prescription Yes   Weight 3lbs   Reps 10-15   Time 10 Minutes     Treadmill   MPH 3   Grade 2   Minutes 10   METs 4.12     Recumbant Bike   Level 2.5   Minutes 10   METs 3     NuStep   Level 3   SPM 80   Minutes 10   METs 2.7     Home Exercise Plan   Plans to continue exercise at Home (comment)   Frequency Add 2 additional days to program exercise sessions.   Initial Home Exercises Provided 05/12/16      Nutrition:  Target Goals: Understanding of nutrition guidelines, daily intake of sodium 1500mg , cholesterol 200mg , calories 30% from fat and 7% or less from saturated fats, daily to have 5 or more servings of fruits and vegetables.  Biometrics:     Pre Biometrics - 04/27/16 1139      Pre Biometrics   Waist Circumference 27.5 inches   Hip Circumference 39 inches   Waist to Hip Ratio 0.71 %   Triceps Skinfold 23 mm   % Body Fat 30.5 %   Grip Strength 29.5 kg   Flexibility 15 in   Single Leg Stand 30 seconds       Nutrition Therapy Plan and Nutrition Goals:   Nutrition Discharge: Nutrition Scores:   Nutrition Goals Re-Evaluation:   Nutrition Goals Re-Evaluation:   Nutrition Goals Discharge (Final Nutrition Goals Re-Evaluation):   Psychosocial: Target Goals: Acknowledge presence or absence of significant depression and/or stress, maximize coping skills, provide positive support system. Participant is able  to verbalize types  and ability to use techniques and skills needed for reducing stress and depression.  Initial Review & Psychosocial Screening:     Initial Psych Review & Screening - 04/27/16 1219      Initial Review   Current issues with None Identified     Family Dynamics   Good Support System? Yes     Barriers   Psychosocial barriers to participate in program There are no identifiable barriers or psychosocial needs.     Screening Interventions   Interventions Encouraged to exercise      Quality of Life Scores:     Quality of Life - 04/27/16 1150      Quality of Life Scores   Health/Function Pre 27.9 %   Socioeconomic Pre 30 %   Psych/Spiritual Pre 30 %   Family Pre 28.5 %   GLOBAL Pre 28.86 %      PHQ-9: Recent Review Flowsheet Data    Depression screen [facility] 2/9 05/01/2016   Decreased Interest 0   Down, Depressed, Hopeless 0   PHQ - 2 Score 0     Interpretation of Total Score  Total Score Depression Severity:  1-4 = Minimal depression, 5-9 = Mild depression, 10-14 = Moderate depression, 15-19 = Moderately severe depression, 20-27 = Severe depression   Psychosocial Evaluation and Intervention:     Psychosocial Evaluation - 05/17/16 1107      Psychosocial Evaluation & Interventions   Interventions Stress management education;Relaxation education;Encouraged to exercise with the program and follow exercise prescription   Comments pt demonstrates positive and healthy coping skills, interacts well with staff and fellow participants   Continue Psychosocial Services  No Follow up required      Psychosocial Re-Evaluation:   Psychosocial Discharge (Final Psychosocial Re-Evaluation):   Vocational Rehabilitation: Provide vocational rehab assistance to qualifying candidates.   Vocational Rehab Evaluation & Intervention:     Vocational Rehab - 04/27/16 1218      Initial Vocational Rehab Evaluation & Intervention   Assessment shows need for Vocational Rehabilitation --  Mrs  Linebaugh is retired and does not need vocational rehab at this time.      Education: Education Goals: Education classes will be provided on a weekly basis, covering required topics. Participant will state understanding/return demonstration of topics presented.  Learning Barriers/Preferences:     Learning Barriers/Preferences - 04/27/16 0826      Learning Barriers/Preferences   Learning Barriers Sight   Learning Preferences Video;Skilled Demonstration;Pictoral      Education Topics: Count Your Pulse:  -Group instruction provided by verbal instruction, demonstration, patient participation and written materials to support subject.  Instructors address importance of being able to find your pulse and how to count your pulse when at home without a heart monitor.  Patients get hands on experience counting their pulse with staff help and individually.   Heart Attack, Angina, and Risk Factor Modification:  -Group instruction provided by verbal instruction, video, and written materials to support subject.  Instructors address signs and symptoms of angina and heart attacks.    Also discuss risk factors for heart disease and how to make changes to improve heart health risk factors.   Functional Fitness:  -Group instruction provided by verbal instruction, demonstration, patient participation, and written materials to support subject.  Instructors address safety measures for doing things around the house.  Discuss how to get up and down off the floor, how to pick things up properly, how to safely get out of a chair without  assistance, and balance training.   CARDIAC REHAB PHASE II EXERCISE from 05/12/2016 in [place]  Date  05/12/16  Instruction Review Code  2- meets goals/outcomes      Meditation and Mindfulness:  -Group instruction provided by verbal instruction, patient participation, and written materials to support subject.  Instructor addresses importance of  mindfulness and meditation practice to help reduce stress and improve awareness.  Instructor also leads participants through a meditation exercise.    Stretching for Flexibility and Mobility:  -Group instruction provided by verbal instruction, patient participation, and written materials to support subject.  Instructors lead participants through series of stretches that are designed to increase flexibility thus improving mobility.  These stretches are additional exercise for major muscle groups that are typically performed during regular warm up and cool down.   Hands Only CPR Anytime:  -Group instruction provided by verbal instruction, video, patient participation and written materials to support subject.  Instructors co-teach with [facility] video for hands only CPR.  Participants get hands on experience with mannequins.   Nutrition I class: Heart Healthy Eating:  -Group instruction provided by PowerPoint slides, verbal discussion, and written materials to support subject matter. The instructor gives an explanation and review of the Therapeutic Lifestyle Changes diet recommendations, which includes a discussion on lipid goals, dietary fat, sodium, fiber, plant stanol/sterol esters, sugar, and the components of a well-balanced, healthy diet.   Nutrition II class: Lifestyle Skills:  -Group instruction provided by PowerPoint slides, verbal discussion, and written materials to support subject matter. The instructor gives an explanation and review of label reading, grocery shopping for heart health, heart healthy recipe modifications, and ways to make healthier choices when eating out.   Diabetes Question & Answer:  -Group instruction provided by PowerPoint slides, verbal discussion, and written materials to support subject matter. The instructor gives an explanation and review of diabetes co-morbidities, pre- and post-prandial blood glucose goals, pre-exercise blood glucose goals, signs, symptoms, and  treatment of hypoglycemia and hyperglycemia, and foot care basics.   Diabetes Blitz:  -Group instruction provided by PowerPoint slides, verbal discussion, and written materials to support subject matter. The instructor gives an explanation and review of the physiology behind type 1 and type 2 diabetes, diabetes medications and rational behind using different medications, pre- and post-prandial blood glucose recommendations and Hemoglobin A1c goals, diabetes diet, and exercise including blood glucose guidelines for exercising safely.    Portion Distortion:  -Group instruction provided by PowerPoint slides, verbal discussion, written materials, and food models to support subject matter. The instructor gives an explanation of serving size versus portion size, changes in portions sizes over the last 20 years, and what consists of a serving from each food group.   Stress Management:  -Group instruction provided by verbal instruction, video, and written materials to support subject matter.  Instructors review role of stress in heart disease and how to cope with stress positively.     Exercising on Your Own:  -Group instruction provided by verbal instruction, power point, and written materials to support subject.  Instructors discuss benefits of exercise, components of exercise, frequency and intensity of exercise, and end points for exercise.  Also discuss use of nitroglycerin and activating EMS.  Review options of places to exercise outside of rehab.  Review guidelines for sex with heart disease.   Cardiac Drugs I:  -Group instruction provided by verbal instruction and written materials to support subject.  Instructor reviews cardiac drug classes: antiplatelets, anticoagulants, beta blockers,  and statins.  Instructor discusses reasons, side effects, and lifestyle considerations for each drug class.   Cardiac Drugs II:  -Group instruction provided by verbal instruction and written materials to  support subject.  Instructor reviews cardiac drug classes: angiotensin converting enzyme inhibitors (ACE-I), angiotensin II receptor blockers (ARBs), nitrates, and calcium channel blockers.  Instructor discusses reasons, side effects, and lifestyle considerations for each drug class.   Anatomy and Physiology of the Circulatory System:  -Group instruction provided by verbal instruction, video, and written materials to support subject.  Reviews functional anatomy of heart, how it relates to various diagnoses, and what role the heart plays in the overall system.   Knowledge Questionnaire Score:     Knowledge Questionnaire Score - 04/27/16 1130      Knowledge Questionnaire Score   Pre Score 21/24      Core Components/Risk Factors/Patient Goals at Admission:     Personal Goals and Risk Factors at Admission - 04/27/16 1107      Core Components/Risk Factors/Patient Goals on Admission    Weight Management Yes;Weight Loss;Weight Maintenance   Intervention Weight Management: Develop a combined nutrition and exercise program designed to reach desired caloric intake, while maintaining appropriate intake of nutrient and fiber, sodium and fats, and appropriate energy expenditure required for the weight goal.;Weight Management: Provide education and appropriate resources to help participant work on and attain dietary goals.;Weight Management/Obesity: Establish reasonable short term and long term weight goals.   Expected Outcomes Long Term: Adherence to nutrition and physical activity/exercise program aimed toward attainment of established weight goal;Short Term: Continue to assess and modify interventions until short term weight is achieved;Weight Maintenance: Understanding of the daily nutrition guidelines, which includes 25-35% calories from fat, 7% or less cal from saturated fats, less than 200mg  cholesterol, less than 1.5gm of sodium, & 5 or more servings of fruits and vegetables daily;Weight Loss:  Understanding of general recommendations for a balanced deficit meal plan, which promotes 1-2 lb weight loss per week and includes a negative energy balance of [phone] kcal/d;Understanding recommendations for meals to include 15-35% energy as protein, 25-35% energy from fat, 35-60% energy from carbohydrates, less than 200mg  of dietary cholesterol, 20-35 gm of total fiber daily;Understanding of distribution of calorie intake throughout the day with the consumption of 4-5 meals/snacks   Lipids Yes   Intervention Provide education and support for participant on nutrition & aerobic/resistive exercise along with prescribed medications to achieve LDL 70mg , HDL >40mg .   Expected Outcomes Short Term: Participant states understanding of desired cholesterol values and is compliant with medications prescribed. Participant is following exercise prescription and nutrition guidelines.;Long Term: Cholesterol controlled with medications as prescribed, with individualized exercise RX and with personalized nutrition plan. Value goals: LDL < 70mg , HDL > 40 mg.      Core Components/Risk Factors/Patient Goals Review:      Goals and Risk Factor Review    Row Name 05/17/16 1646             Core Components/Risk Factors/Patient Goals Review   Personal Goals Review Weight Management/Obesity;Lipids       Review Making progress toward meeting goals.  Pt has lost 1 kg since starting cardiac rehab.          Core Components/Risk Factors/Patient Goals at Discharge (Final Review):      Goals and Risk Factor Review - 05/17/16 1646      Core Components/Risk Factors/Patient Goals Review   Personal Goals Review Weight Management/Obesity;Lipids   Review Making progress toward meeting goals.  Pt has lost 1 kg since starting cardiac rehab.      ITP Comments:     ITP Comments    Row Name 04/27/16 [id number]           ITP Comments Medical Director, Dr. Fransico Him          Comments:  Pt is making expected  progress toward personal goals after completing 7 sessions. Pt is off to a great start. Pyschosocial Assessment Pt demonstrates positive and healthy outlook on life, pt has supportive husband. Pt has no identifiable barriers to participating in cardiac rehab. Recommend continued exercise and life style modification education including  stress management and relaxation techniques to decrease cardiac risk profile. Cherre Huger, BSN Cardiac and Training and development officer

## 2016-05-19 ENCOUNTER — Encounter (HOSPITAL_COMMUNITY)
Admission: RE | Admit: 2016-05-19 | Discharge: 2016-05-19 | Disposition: A | Discharge status: Home / Self Care | Payer: 59 | Source: Ambulatory Visit | Attending: Cardiovascular Disease | Admitting: Cardiovascular Disease

## 2016-05-19 DIAGNOSIS — G90A Postural orthostatic tachycardia syndrome (POTS): Secondary | ICD-10-CM

## 2016-05-19 DIAGNOSIS — R000 Tachycardia, unspecified: Secondary | ICD-10-CM | POA: Diagnosis not present

## 2016-05-19 DIAGNOSIS — I951 Orthostatic hypotension: Principal | ICD-10-CM

## 2016-05-22 ENCOUNTER — Encounter (HOSPITAL_COMMUNITY)
Admission: RE | Admit: 2016-05-22 | Discharge: 2016-05-22 | Disposition: A | Discharge status: Home / Self Care | Payer: 59 | Source: Ambulatory Visit | Attending: Cardiovascular Disease | Admitting: Cardiovascular Disease

## 2016-05-22 DIAGNOSIS — R000 Tachycardia, unspecified: Principal | ICD-10-CM

## 2016-05-22 DIAGNOSIS — I951 Orthostatic hypotension: Principal | ICD-10-CM

## 2016-05-22 DIAGNOSIS — G90A Postural orthostatic tachycardia syndrome (POTS): Secondary | ICD-10-CM

## 2016-05-24 ENCOUNTER — Encounter (HOSPITAL_COMMUNITY)
Admission: RE | Admit: 2016-05-24 | Discharge: 2016-05-24 | Disposition: A | Discharge status: Home / Self Care | Payer: 59 | Source: Ambulatory Visit | Attending: Cardiovascular Disease | Admitting: Cardiovascular Disease

## 2016-05-24 DIAGNOSIS — I951 Orthostatic hypotension: Secondary | ICD-10-CM | POA: Diagnosis present

## 2016-05-24 DIAGNOSIS — R000 Tachycardia, unspecified: Secondary | ICD-10-CM | POA: Diagnosis present

## 2016-05-24 DIAGNOSIS — G90A Postural orthostatic tachycardia syndrome (POTS): Secondary | ICD-10-CM

## 2016-05-26 ENCOUNTER — Encounter (HOSPITAL_COMMUNITY)
Admission: RE | Admit: 2016-05-26 | Discharge: 2016-05-26 | Disposition: A | Discharge status: Home / Self Care | Payer: 59 | Source: Ambulatory Visit | Attending: Cardiovascular Disease | Admitting: Cardiovascular Disease

## 2016-05-26 DIAGNOSIS — R000 Tachycardia, unspecified: Secondary | ICD-10-CM | POA: Diagnosis not present

## 2016-05-26 DIAGNOSIS — G90A Postural orthostatic tachycardia syndrome (POTS): Secondary | ICD-10-CM

## 2016-05-26 DIAGNOSIS — I951 Orthostatic hypotension: Principal | ICD-10-CM

## 2016-05-29 ENCOUNTER — Encounter (HOSPITAL_COMMUNITY)
Admission: RE | Admit: 2016-05-29 | Discharge: 2016-05-29 | Disposition: A | Discharge status: Home / Self Care | Payer: 59 | Source: Ambulatory Visit | Attending: Cardiovascular Disease | Admitting: Cardiovascular Disease

## 2016-05-29 DIAGNOSIS — G90A Postural orthostatic tachycardia syndrome (POTS): Secondary | ICD-10-CM

## 2016-05-29 DIAGNOSIS — R000 Tachycardia, unspecified: Secondary | ICD-10-CM | POA: Diagnosis not present

## 2016-05-29 DIAGNOSIS — I951 Orthostatic hypotension: Principal | ICD-10-CM

## 2016-05-29 NOTE — Progress Notes (Signed)
Amy Lane 66 y.o. female Nutrition Note Spoke with pt. Nutrition Survey reviewed with pt. Pt is following Step 2 of the Therapeutic Lifestyle Changes diet. Pt's elevated A1c 3 years ago discussed. Pt states she is monitored for diabetes annually. Pt expressed understanding of the information reviewed. Pt aware of nutrition education classes offered.  Lab Results  Component Value Date   HGBA1C 6.1 (H) 01/29/2013   Wt Readings from Last 3 Encounters:  04/27/16 143 lb 15.4 oz (65.3 kg)  03/23/16 141 lb 12.8 oz (64.3 kg)  12/23/15 142 lb 8 oz (64.6 kg)   Nutrition Diagnosis ? Food-and nutrition-related knowledge deficit related to lack of exposure to information as related to diagnosis of: ? CVD ? Pre-DM  Nutrition Intervention ? Benefits of adopting Therapeutic Lifestyle Changes discussed when Med[employer]s reviewed. ? Pt to attend the Portion Distortion class ? Pt given handouts for: ? Nutrition I class ? Nutrition II class ? Continue client-centered nutrition education by RD, as part of interdisciplinary care.  Goal(s) ? Pt to describe the benefit of including fruits, vegetables, whole grains, and low-fat dairy products in a heart healthy meal plan.  Monitor and Evaluate progress toward nutrition goal with team.  Derek Mound, M.Ed, RD, LDN, CDE 05/29/2016 1:45 PM

## 2016-05-31 ENCOUNTER — Encounter (HOSPITAL_COMMUNITY)
Admission: RE | Admit: 2016-05-31 | Discharge: 2016-05-31 | Disposition: A | Discharge status: Home / Self Care | Payer: 59 | Source: Ambulatory Visit | Attending: Cardiovascular Disease | Admitting: Cardiovascular Disease

## 2016-05-31 DIAGNOSIS — G90A Postural orthostatic tachycardia syndrome (POTS): Secondary | ICD-10-CM

## 2016-05-31 DIAGNOSIS — R000 Tachycardia, unspecified: Secondary | ICD-10-CM | POA: Diagnosis not present

## 2016-05-31 DIAGNOSIS — I951 Orthostatic hypotension: Principal | ICD-10-CM

## 2016-06-02 ENCOUNTER — Telehealth: Payer: Self-pay | Admitting: Neurology

## 2016-06-02 ENCOUNTER — Encounter (HOSPITAL_COMMUNITY): Payer: 59

## 2016-06-02 NOTE — Telephone Encounter (Signed)
Pt called said she has appt with Dr Viona Gilmore on 6/4 and she has cardiac appt that morning also. Dr Viona Gilmore is booked into November. She is agreeable to coming to a 7:30 appt most any day. Can you call to get her in sooner than November please

## 2016-06-02 NOTE — Telephone Encounter (Signed)
Pt has an appt on 06/26/2016.IF patient cant keep that appt she needs to see the NP, and be schedule with them.

## 2016-06-05 ENCOUNTER — Encounter (HOSPITAL_COMMUNITY): Payer: 59

## 2016-06-07 ENCOUNTER — Encounter (HOSPITAL_COMMUNITY): Payer: 59

## 2016-06-09 ENCOUNTER — Encounter (HOSPITAL_COMMUNITY): Admission: RE | Admit: 2016-06-09 | Payer: 59 | Source: Ambulatory Visit

## 2016-06-12 ENCOUNTER — Ambulatory Visit (INDEPENDENT_AMBULATORY_CARE_PROVIDER_SITE_OTHER)
Admission: RE | Admit: 2016-06-12 | Discharge: 2016-06-12 | Disposition: A | Discharge status: Home / Self Care | Payer: 59 | Source: Ambulatory Visit | Attending: Pulmonary Disease | Admitting: Pulmonary Disease

## 2016-06-12 ENCOUNTER — Other Ambulatory Visit (INDEPENDENT_AMBULATORY_CARE_PROVIDER_SITE_OTHER): Payer: 59

## 2016-06-12 ENCOUNTER — Ambulatory Visit (INDEPENDENT_AMBULATORY_CARE_PROVIDER_SITE_OTHER): Payer: 59 | Admitting: Pulmonary Disease

## 2016-06-12 ENCOUNTER — Ambulatory Visit: Payer: No Typology Code available for payment source | Admitting: Pulmonary Disease

## 2016-06-12 ENCOUNTER — Encounter: Payer: Self-pay | Admitting: Pulmonary Disease

## 2016-06-12 ENCOUNTER — Other Ambulatory Visit: Payer: Self-pay | Admitting: Pulmonary Disease

## 2016-06-12 VITALS — BP 100/60 | HR 62 | Temp 96.5°F | Ht 70.5 in | Wt 140.2 lb

## 2016-06-12 DIAGNOSIS — K5909 Other constipation: Secondary | ICD-10-CM | POA: Diagnosis not present

## 2016-06-12 DIAGNOSIS — E559 Vitamin D deficiency, unspecified: Secondary | ICD-10-CM

## 2016-06-12 DIAGNOSIS — Z86718 Personal history of other venous thrombosis and embolism: Secondary | ICD-10-CM | POA: Diagnosis not present

## 2016-06-12 DIAGNOSIS — Z950 Presence of cardiac pacemaker: Secondary | ICD-10-CM

## 2016-06-12 DIAGNOSIS — E039 Hypothyroidism, unspecified: Secondary | ICD-10-CM | POA: Diagnosis not present

## 2016-06-12 DIAGNOSIS — G901 Familial dysautonomia [Riley-Day]: Secondary | ICD-10-CM

## 2016-06-12 DIAGNOSIS — Q796 Ehlers-Danlos syndrome, unspecified: Secondary | ICD-10-CM

## 2016-06-12 DIAGNOSIS — G909 Disorder of the autonomic nervous system, unspecified: Secondary | ICD-10-CM

## 2016-06-12 DIAGNOSIS — I495 Sick sinus syndrome: Secondary | ICD-10-CM

## 2016-06-12 DIAGNOSIS — R413 Other amnesia: Secondary | ICD-10-CM

## 2016-06-12 DIAGNOSIS — I059 Rheumatic mitral valve disease, unspecified: Secondary | ICD-10-CM

## 2016-06-12 DIAGNOSIS — I480 Paroxysmal atrial fibrillation: Secondary | ICD-10-CM | POA: Diagnosis not present

## 2016-06-12 DIAGNOSIS — E7800 Pure hypercholesterolemia, unspecified: Secondary | ICD-10-CM

## 2016-06-12 DIAGNOSIS — G458 Other transient cerebral ischemic attacks and related syndromes: Secondary | ICD-10-CM | POA: Diagnosis not present

## 2016-06-12 DIAGNOSIS — R269 Unspecified abnormalities of gait and mobility: Secondary | ICD-10-CM

## 2016-06-12 LAB — CBC WITH DIFFERENTIAL/PLATELET
BASOS ABS: 0 10*3/uL (ref 0.0–0.1)
Basophils Relative: 0.3 % (ref 0.0–3.0)
Eosinophils Absolute: 0.1 10*3/uL (ref 0.0–0.7)
Eosinophils Relative: 1.1 % (ref 0.0–5.0)
HEMATOCRIT: 40 % (ref 36.0–46.0)
HEMOGLOBIN: 13.6 g/dL (ref 12.0–15.0)
LYMPHS ABS: 1 10*3/uL (ref 0.7–4.0)
LYMPHS PCT: 9.7 % — AB (ref 12.0–46.0)
MCHC: 34.1 g/dL (ref 30.0–36.0)
MCV: 92.6 fl (ref 78.0–100.0)
MONOS PCT: 8.2 % (ref 3.0–12.0)
Monocytes Absolute: 0.8 10*3/uL (ref 0.1–1.0)
NEUTROS PCT: 80.7 % — AB (ref 43.0–77.0)
Neutro Abs: 8 10*3/uL — ABNORMAL HIGH (ref 1.4–7.7)
PLATELETS: 190 10*3/uL (ref 150.0–400.0)
RBC: 4.32 Mil/uL (ref 3.87–5.11)
RDW: 13.1 % (ref 11.5–15.5)
WBC: 9.9 10*3/uL (ref 4.0–10.5)

## 2016-06-12 LAB — COMPREHENSIVE METABOLIC PANEL
ALBUMIN: 4.3 g/dL (ref 3.5–5.2)
ALK PHOS: 77 U/L (ref 39–117)
ALT: 12 U/L (ref 0–35)
AST: 19 U/L (ref 0–37)
BUN: 14 mg/dL (ref 6–23)
CO2: 29 mEq/L (ref 19–32)
Calcium: 9.4 mg/dL (ref 8.4–10.5)
Chloride: 100 mEq/L (ref 96–112)
Creatinine, Ser: 0.82 mg/dL (ref 0.40–1.20)
GFR: 74.64 mL/min (ref >60.00)
Glucose, Bld: 96 mg/dL (ref 70–99)
POTASSIUM: 4.7 meq/L (ref 3.5–5.1)
SODIUM: 135 meq/L (ref 135–145)
TOTAL PROTEIN: 7.5 g/dL (ref 6.0–8.3)
Total Bilirubin: 0.3 mg/dL (ref 0.2–1.2)

## 2016-06-12 LAB — HEPATITIS C ANTIBODY: HCV AB: NEGATIVE

## 2016-06-12 LAB — TSH: TSH: 9.5 u[IU]/mL — AB (ref 0.35–4.50)

## 2016-06-12 LAB — LIPID PANEL
Cholesterol: 159 mg/dL (ref 0–200)
HDL: 51 mg/dL (ref >39.00)
LDL Cholesterol: 94 mg/dL (ref 0–99)
NONHDL: 108.14
Total CHOL/HDL Ratio: 3
Triglycerides: 71 mg/dL (ref 0.0–149.0)
VLDL: 14.2 mg/dL (ref 0.0–40.0)

## 2016-06-12 LAB — VITAMIN D 25 HYDROXY (VIT D DEFICIENCY, FRACTURES): VITD: 23.73 ng/mL — ABNORMAL LOW (ref 30.00–100.00)

## 2016-06-12 MED ORDER — AZELASTINE-FLUTICASONE 137-50 MCG/ACT NA SUSP: NASAL | 3 refills | Status: DC

## 2016-06-12 MED ORDER — PREDNISONE 5 MG (21) PO TBPK: ORAL_TABLET | ORAL | 0 refills | Status: DC

## 2016-06-12 MED ORDER — LEVOTHYROXINE SODIUM 75 MCG PO TABS: 75.0000 ug | ORAL_TABLET | Freq: Every day | ORAL | 3 refills | Status: DC

## 2016-06-12 NOTE — Patient Instructions (Signed)
Today we updated your med list in our EPIC system...    Continue your current medications the same...  Today we did your follow up CXR & FASTING blood work...    We will contact you w/ the results when available...   Call your GYN for a follow up appt at your convenience...  Stay as active as poss & continue the Phys Therapy exercises...  Have a great time on your trip!!!  Call for any questions...  Let's plan a follow up visit in 13mo, sooner if needed for problems.Marland KitchenMarland Kitchen

## 2016-06-12 NOTE — Progress Notes (Signed)
Subjective:    Patient ID: Amy Lane, female    DOB: [birth year], 66 y.o.   MRN: [id number]  HPI 66 y/o WF here for a follow up visit... she has mult med problems as noted below... she is followed regularly by Drs Maryellen Pile for Cardiology, and Drs Jeri Modena for GI... ~  SEE PREV EPIC NOTES FOR OLDER DATA >>     LABS 4/14:  FLP- not at goals on diet alone but intol to all meds;  Chems- wnl;  CBC- wnl;  TSH=3.10;  VitD=19 & Rec VitD OTC supplement ~2000u daily...  April 11, 2013:  Amy Lane was [facility] 1/6 - 01/30/13 by [facility] w/ HA & altered mental status- ?TIA vs complex migraine- CT Head was neg; CDopplers were ok; 2DEcho showed norm LVF, myxomatous MV s/p repair no resid MR; Coumadin was continued and ASA81 was added... She had ROV DrWillis 2/15> he felt the episode was a migraine equiv & no recurrent prob since then, she notes mild memory impairment w/ MMSE= 29/30... She notes that she's been under a lot of stress w/ father-in-law illness, husb w/ prostate ca, sold house & had to move...   CXR 01/28/13 showed norm heart size, pacemeaker & sternal wires, hyperinflation and asymmetric biapical pleural thickening L>R, NAD...  2DEcho 01/28/13 showed norm LV size & function w/ EF=55-60%, myxomatous MV s/p MVrepair & no resid MR, mild LA dil...  LABS 4/15:  FLP- at goals on Zetia10;  Chems- wnl;  CBC- wnl;  TSH=4.99... ~  [id number]:  Amy Lane has been under some stress- husb Fritz Pickerel) was Dx w/ recurrent prostate cancer at the [facility] & underwent XRT (they had to live in [place] for 86mo while he was receiving therapy);   LABS 3/16:  FLP- at goals on Zetia10;  Chems- wnl;  CBC- wnl;  TSH=3.24   Sed=15;  Anti-CCP=neg   ~  November 30, 2014:  73mo ROV & Amy Lane report a good interval overall- she had some constipation, went to the ER (given BE prep)/ then on to see DrJacobs in GI & started on [id number] which is working very well for her;  We reviewed the following medical problems during today's office visit  >>     AR> on Benedryl OTC & Nasonex prn; breathing is good w/o cough, sput, SOB, etc...    Hx Dysautonomia (POTS) & Ehlers-Danlos syndrome> followed by DrNishan/DrKlein & DrHawkes (seen 4/16 w/ hand pain/weakness, polyarthralgia, OA); BP= 118/70 w/o postural change, on Eliquis5Bid...    MVP, Myxomatous valve- s/p MVrepair (in [place])> on Eliquis5Bid; she is followed by DrKlein- seen 4/16 & stable pacer & no changes made...    Hx Tachy-brady, Fib-flutter w/ ablation & pacer> on Sectral200/d, & Eliquis5Bid; she prev did her own Protimes at home & called to the CC for adjustments=> changed to Eliquis5Bid.    Cerebrovasc Dis> on Eliquis5Bid, s/p left CAE 2005 by [facility] for asymptomatic left carotid stenosis w/ ulceration, & f/u CDopplers all ok...    ?DVT & ?PE while on Coumadin (during trip to [place])> she then checked her own protimes like they do in [place]...    CHOL> on Zetia10 since 1/15 Hosp where LDL was 155; FLP on Zetia10 3/16 shows TChol 167, TG 66, HDL 61, LDL 93; she is intol to all other meds...    Hypothy> on Synthroid75; lab 3/16 shows TSH= 3.24; continue same meds, take pill first thing in morn, etc...    Underweight> BMI in the 19-20 range & we discussed  nutritional supplements etc...     GI- Constip & Hems> on Align; c/o constip & now improved on Linzess per DrJacobs; last colon 2009 by DrJacobs w/ hems only & f/u planned 8yrs...    GYN- kraurosis vulvae (DIV)> local GYN, [facility] vulva clinic treatment & she is up to date on screening; Hx enterococcus UTI...    Migraine HAs & ?TIA> she is followed by [facility] Neuro- DrWillis; Hosp 1/15 w/ another episode, symptoms cleared, ASA81 added to her Coumadin which was later changed to Eliquis5Bid; she had c/o some leg weakness but EMG/NCV was normal & rec to try PT...    Anxiety/ Depression> on Ativan 1mg  prn & Celexa20 which she really thinks is helping...  EXAM shows Afeb, VSS, O2sat=98% on RA;  Marfanoid habitus;  HEENT- neg,  mallampati2;  Chest- clear w/o w/r/r;  Heart- RR w/o m/r/g;  Abd- soft, nontender;  Ext- neg w/o c/c/e;  Neuro- intact.. IMP/PLAN>>  OK Flu shot today;  Meds refilled- continue same and ROV follow up in 44mo...  ~  May 31, 2015:  32mo ROV & Amy Lane has had a lot going on neurologically in the interval>  She fell at home w/ fx bones in left foot- eval & cast by DrHewitt, just got the cast off to boot now; she had f/u DrWillis w/ mild cognitive impairment & he started Aricept5=>10 & pt feels much better, memory improved, not getting lost driving, etc;  Also says she was told "prob MS" (falls, weakness, bladder, etc) but they can't do MRI due to her pacer;  She has a great attitude, feeling well today, no prob w/ meds, due for CXR & FASTING blood work today... Given PREVNAR-13 today as well.    AR> on Benedryl OTC & Nasonex prn; breathing is good w/o cough, sput, SOB, etc; doing OK even w/ the pollen...    Hx Dysautonomia (POTS) & Ehlers-Danlos syndrome> followed by DrNishan/DrKlein & DrHawkes (seen 4/16 w/ hand pain/weakness, polyarthralgia, OA- we do not have more recent note); BP= 118/70 no postural change, on Eliquis5Bid.    MVP, Myxomatous valve- s/p MVrepair (in [place])> on Eliquis5Bid; she is followed by DrKlein- seen 4/16 & stable pacer & no changes made...    Hx Tachy-brady, Fib-flutter w/ ablation & pacer> on Sectral200/d, & Eliquis5Bid; she prev did her own Protimes at home & changed to Eliquis5Bid...    Cerebrovasc Dis> on Eliquis5Bid, s/p left CAE 2005 by [facility] for asymptomatic left carotid stenosis w/ ulceration, & f/u CDopplers all ok...    ?DVT & ?PE while on Coumadin (during trip to [place])> she then checked her own protimes like they do in [place]; now on eliquis5Bid & stable...    CHOL> on Zetia10 since 1/15 Hosp where LDL was 155; FLP on Zetia10 5/17 shows TChol 168, TG 105, HDL 60, LDL 87; she is intol to all other meds...    Hypothy> on Synthroid75; lab 5/17 shows TSH= 2.08; continue same  meds, take pill first thing in morn, etc...    Underweight> BMI in the 19-20 range & we discussed nutritional supplements etc=> wt up to 142#...     GI- Constip & Hems> on Align; c/o constip & now improved on Linzess per DrJacobs; last colon 2009 by DrJacobs w/ hems only & f/u planned 47yrs...    GYN- kraurosis vulvae (DIV)> local GYN, [facility] vulva clinic treatment & she is up to date on screening; Hx enterococcus UTI...    Migraine HAs & ?TIA, Mild cognitive impairment, ?MS> she is  followed by [facility]- DrWillis; Hosp 1/15 w/ another episode, symptoms cleared, ASA81 added to her Coumadin which was later changed to Eliquis5Bid; she had c/o some leg weakness but EMG/NCV was normal & rec to try PT;  She reports [facility] on Aricept & improved memory she says; DrWillis indicated that he thinks she has MS but they can't get MRI w/ her pacer...    Anxiety/ Depression> on Ativan 1mg  prn & Celexa20 which she really thinks is helping...  EXAM shows Afeb, VSS, O2sat=98% on RA;  Marfanoid habitus;  HEENT- neg, mallampati2;  Chest- clear w/o w/r/r;  Heart- RR w/o m/r/g;  Abd- soft, nontender;  Ext- neg w/o c/c/e;  Neuro- intact..  CXR 05/31/15>  Stable- norm heart size, s/p median sternotomy & MVR, pacer in situ, hyperinflated appearance suggests underlying COPD, scarring left apex, NAD...  LABS 05/31/15>  FLP- at goals on Zetia10;  Chems- wnl;  CBC- wnl;  TSH=2.08;   IMP/PLAN>>  Amy Lane looks good, she has gained a few lbs eating ice cream Qhs, yet her Lipids remain in good control, Labs normal, CXR clear; she is planning a [place] cruise this summer w/ a nurse friend; stable on current med rx- continue same, given PREVNAR-13 today...  ~  December 14, 2015:  53mo ROV & Amy Lane reports doing well- feels good and w/o complaints except a constant runny nose; a friend uses Atrovent nasal spray & she wonders if she can try this- Rx written for 1-2 sprays in each nostril Bid;  She is looking forward to another [place] vacation next  yr;  Her main source of exercise is walking... Prob List reviewed- given 2017 flu vaccine today...    She saw DrKlein 11/02/15>  Hx SBrady, s/p pacer w/ generator change and lead repair 03/2009; she has Ehlers-Danlos syndrome, marfanoid habitus, Hx MVP w/ MV repair in [place]; she has hx prior TIA, syncope from long QT syndrome; and signif dysautonomia c/w postural orthostatic tachycardia syndrome (POTS);  Pacer functioning well, no changes made to the programming; continue Eliquis...    She saw Cherly Hensen 09/20/15>  Note reviewed, no changes made in meds...     She saw DrWillis 06/15/15>  Note reviewed, Hx migraines, gait abn, reported gradual change in memory which improved significantly on Aricept5, Exam was neg, Prev Carotid dopplers 02/2015 & 06/2015 were OK showing heterogeneous plaque bilat w/ prev left CEA & DPA, 1-39% bilat ICSstenoses, norm subclav & norm vertebrals bilaterally...  IMP/PLAN>>  OK 2017 Flu shot, continue same meds and Rx written for Atrovent nasal spray at her request; we will continue Q31mo ROVs...   ~  Jun 12, 2016:  39mo ROV & Amy Lane has been doing well overall-- c/o recent allergy problems w/ head congestion, nasal drainage, watery eyes, sl sore throat & hoarseness she says; she has been using Atrovent nasal & OTC Benedryl w/ min relief;  She is traveling to [place] soon for a 3wk vacation & [place] river cruise- very excited & she says "the best I've felt since I was 41"... We reviewed the following medical problems during today's office visit >>     AR> on Atrovent nasal, Benedryl OTC & Nasonex prn; breathing is good w/o cough, sput, SOB, etc; now having a rough spring w/ the pollen => Rx w/ DYMISTA & PRED Dosepak...    Hx Dysautonomia (POTS) & Ehlers-Danlos syndrome> followed by DrNishan/DrKlein & DrHawkes (seen w/ hand pain/weakness, polyarthralgia, OA- she is doing PT and loves it!); BP= 100/60 no postural change.Marland KitchenMarland Kitchen  MVP, Myxomatous valve- s/p MVrepair (in [place])> on  Eliquis5Bid; she is followed by DrKlein & Nishan- stable pacer checks & no changes made...    Hx Tachy-brady, Fib-flutter w/ ablation & pacer> on Sectral200/d, & Eliquis5Bid; EKG 10/17 showed electronic atrial pacer, bifascicular block w/ RBBB & left post fascicular block...    Cerebrovasc Dis> on Eliquis5Bid, s/p left CAE 2005 by [facility] for asymptomatic left carotid stenosis w/ ulceration, & f/u CDopplers by [facility]=> last 6/17 w/ mild homogeneous plaque, no signif stenoses...    ?DVT & ?PE while on Coumadin (during trip to [place])> she then checked her own protimes like they do in [place] & now on Eliquis5Bid & stable...    CHOL> on Zetia10 (INTOL statins) since 1/15 Hosp where LDL was 155; FLP on Zetia10 5/18 shows TChol 159, TG 71, HDL 51, LDL 94; she is intol to all other meds...    Hypothy> on Synthroid75; lab 5/18 shows TSH= 9.50; REC- continue same med, take 1st thing in AM by itself, do not eat for 15min & recheck lab in 70mo => TSH=8.43 therefore incr to 110mcg/d...    Underweight> BMI in the 19-20 range & we discussed nutritional supplements etc=> wt is steady & she feels well...     GI- Constip & Hems> on Align; c/o constip & now improved on Linzess72/d per DrJacobs; last colon 2009 by DrJacobs w/ hems only & f/u planned 41yrs...    GYN- kraurosis vulvae (DIV)> local GYN, [facility] vulva clinic treatment & she will keep up to date on screening; Hx enterococcus UTI...    Migraine HAs & ?TIA, Mild cognitive impairment, ?MS> she is followed by [facility]- DrWillis; Hosp 1/15 w/ another episode, symptoms cleared, ASA81 added to her Coumadin which was later changed to Eliquis5Bid; she had c/o some leg weakness but EMG/NCV was normal & rec to try PT=> much improved;  She reports MCI on Aricept & improved memory she says; DrWillis indicated that he thinks she has MS but they can't get MRI w/ her pacer...    Vit D deficiency>  Labs 5/18 showed VitD level = 24 & she is rec to start VitD 2000u  daily OTC supplement...    Anxiety/ Depression> on Ativan 1mg  prn (takes it Qhs) & Celexa20 which she really thinks is helping...  EXAM shows Afeb, VSS, O2sat=96% on RA;  Marfanoid habitus;  HEENT- neg, mallampati2;  Chest- clear w/o w/r/r;  Heart- RR w/o m/r/g;  Abd- soft, nontender;  Ext- neg w/o c/c/e;  Neuro- intact.Marland Kitchen  2DEcho 03/2016 showed mild conc LVH, norm LVF w/ EF=60-65%, no regional wall motion abn, Gr2DD, MV is mod thickened & myxomatous- s/p repair, triv MR...   CXR 06/12/16 (independently reviewed by me in the PACS system) showed hyperinflation/ body habitus, no focal parenchymal abn- NAD, norm heart size w/ prior median sternotomy & dual lead pacer in good position...   LABS 06/12/16>  FLP- all parameters at goals on Zetia10;  Chems- wnl w/ BS=96, Cr=0.82, LFTs wnl;  CBC- wnl w/ Hg=13.6;  TSH=9.50 & advised proper dosing & compliance w/ recheck;  VitD=24 & rec 2000u/d supplement;  HepC=neg...  IMP/PLAN>>  For her AR, allergy symptoms we will Rx w/ the addition of Dymista & Pred Dosepak;  Her TSH is elev & I suspect compliance vs interference in absorption- rec to take 1st thing in the AM by itself & do not eat or drink for 65min, we will recheck TSH in 31mo;  Routine check of Vit D level  is low at 24 & she is rec to start VitD 2000u/d OTC supplement... She will contact GYN for routine f/u visit due...           Problem List:      ALLERGIC RHINITIS (ICD-477.9) - we discussed Rx w/ Zyrtek, Astepro Prn, etc... ~  CXR 3/11 showed left pacer, sternal wires, NAD.Marland Kitchen. ~  CXR 6/13 showed stable post-op changes and pacer, pulm hyperinflation & scattered scarringt, calcif mediastinal nodes, no focal opacities, NAD.Marland Kitchen. ~  CXR 1/15 showed norm heart size, post op changes & pacer, no adenop, no edema, clear x some left apical pleural thickening w/o change...  MITRAL VALVE PROLAPSE (ICD-424.0) - followed by Cherly Hensen... ~  2DEcho 11/08 showed myxomatous MV, annuloplasty ring, decr post leaflet  excursion, norm LVH & wall motion... ~  NuclearStressTest 11/08 showed mild apical thinning... prev study 12/04 w/o ischemia or infarct & EF=68%... ~  she sees [place] every 6 months- for f/u MVP (s/p MV repair at [facility]), dysautonomia (s/p pacer), & Ehlers-Danlos Syndrome> last seen 1/15 & note reviewed, EKG w/ irreg rhythm, borderline QT interval... ~  2DEcho 1/15> cavity size was normal, wall thickness was normal w/ norm systolic function was normal & EF= 55% to 60%, myxomatous MV s/p repair with no residual MR, LA is mildly dilated.  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 hx AFib/Flutter w/ ablation & pacemaker placed ... she has Ehlers-Danlos synd w/ prolonged QT interval...  CARDIAC PACEMAKER IN SITU (ICD-V45.01) - f/u DrKlein w/ CTChest 4/09- neg. ~  dual chamber pacer changed 3/11 by DrKlein for end-of-life... ~  EKG shows NSR, 1st degree AVB, RBBB... ~  3/15: she had f/u DrKlein> note reviewed, pacer functioning well- no changes made but he wanted her to consider a NOAC...  DYSAUTONOMIA (ICD-742.8) - prev on Proamatine 5mg  tabs per DrKlein, but this was stopped...  CEREBROVASCULAR DISEASE (ICD-437.9) - on ASA 81mg /d & COUMADIN => 11/2014 she was switched to Eliquis5Bid... ~  she had a Left CAE 11/05 by [facility] for symptomatic left carotid stenosis w/ ulceration... ~  Carotid angiogram 5/08 by [facility] showed <30% right carotid stenosis ~  f/u CTA of Head & Neck 12/09 was neg= norm CTA head, & no signif carotid dis noted in neck. ~  CDoppler 2/11 showed stable mild carotid dis, left CAE w/ DPA is patent, 0-39% bilat ICA stenoses. ~  repear CDopplers 11/11 per Cherly Hensen showed smooth plaque in right bulb & distal left CCA- stable; 0-39% bilat ICA stenoses... ~  CT Head 6/13 is neg- No acute intracranial abnormalities... ~  CTA Brain 6/13 showed mild atherosclerotic dis in the carotid siphon regions bilat w/o stenoses, otherw  norm CTA brain w/o lesions... ~  CT Angio Neck 6/13 via ER showed mild nonstenotic arteriosclerotic dis at both carotid bifurcations w/o signif stenoses... ~  CDopplers 11/13 showed mild soft smooth plaque w/ 0-39% right ICAstenosis & 40-59% left ICA stenosis, vertebrals are antegrade... ~  CDopplers 1/15 showed 1-39% internal carotid artery stenosis bilaterally & antegrade vertebral flow... ~  CDopplers 2/16 showed mixed plaque bilat, stable 1-39% bilat ICAstenoses w/ left CEA & DPA, norm subclav & patent vertebrals...  ?DVT & poss PTE while on vacation in [place] during the summer of 2012 >> pt was on Coumadin & INR was therapeutic; she had pain & swelling in left leg w/ a pos doppler reported by a [place] physician; she was also SOB & they questioned poss PTE but  didn't get scan or further eval;  On return to [place] DrNishan wondered if she might have bled into the leg muscles & sent her to DrNorris for Ortho eval; he could not corroborate the theory & treated her w/ compression stockings & she gradually improved towards her baseline...  HYPERCHOLESTEROLEMIA (ICD-272.0) - on diet alone now, prev on Lip20- this was stopped 1/12 by Cherly Hensen & she feels much better off this med! ~  [place] 05/13/07 on Lip20 showed TChol 119, TG 50, HDL 55, LDL 54 ~  FLP 5/10 on Lip20 showed TChol 120, TG 43, HDL 58, LDL 54 ~  FLP 9/11 on Lip20 showed TChol 130, TG 54, HDL 59, LDL 60 ~  FLP 5/12 on diet alone showed TChol 218, TG 91, HDL 57, LDL 135... Continue low chol, low fat diet. ~  FLP 10/12 showed TChol 231, TG 86, HDL 68, LDL 151... I rec the Lipid CLinic, & they started NIACIN 500mg /d. ~  FLP 2/13 on Niacin500 showed TChol 199, TG 55, HDL 76, LDL 112... Much improved, continue same. ~  FLP 11/13 on diet alone showed TChol 231, TG 49, HDL 67, LDL 151..,. rec refer to Lipid clinic. ~  [place] 4/14 on diet + Niacin showed TChol 233, TG 78, HDL 75, LDL 146  ~  FLP 1/15 in hosp showed Tchol 235, TG 79, HDL 64, LDL 155 => ZETIA  10mg /d started... ~  [place] 3/15 on Zetia10 showed TChol 172, TG 74, HDL 59, LDL 89... Continue same... ~  [place] 3/16 on Zetia10 showed TChol 167, TG 66, HDL 61, LDL 93  HYPOTHYROIDISM (ICD-244.9) - currently on SYNTHROID 37mcg/d... ~  labs 4/09 on Levoth88 showed TSH = 0.10... rec to decr Synthroid to 69mcg/d... ~  labs 9/09 on Levoth75 showed TSH = 1.67 ~  labs 5/10 on Levoth75 showed TSH = 2.92... Continue same. ~  labs 9/11 on Levoth75 showed TSH= 1.37 ~  Labs 5/12 on Levothy75 showed TSH= 4.33 ~  Labs 10/12 on Levothy75 showed TSH= 1.51... Continue same. ~  Labs 11/13 on Levothy75 showed TSH= 1.55 ~  Labs 4/14 on Levothy75 showed TSH= 3.10 ~  Labs 3/15 on Levothy75 showed TSH= 4.99... Reminded to take every AM on empty stomach etc. ~  Labs 3/16 showed TSH= 3.24  HEMORRHOIDS (ICD-455.6) - last colonoscopy 2/09 by DrJacobs showed only sm hems... f/u 59yrs.  GYN - followed by DrMcPhail and Dx w/ kraurosis vulvae 7/10... Prev on Premarin VagCream. ~  11/10: she discussed the Dx w/ me & will inquire about poss hyst/ BSO... ~  4/11:  eval in the [facility] by Dr Illene Bolus showed vulvar dermatitis & dryness w/ rec for "Crisco" Tid! 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   FIBROMYALGIA (ICD-729.1) - she has been doing Yoga classes and this has helped... ~  She saw DrNorris 9/14 w/ neck pain> CT CSpine showed degen disc dis & he rec PT... ~  3/16: she is c/o persistent bilat hand pain & swelling, not much better w/ OTC NSAIDs; we decided to check Anti-CCP (neg) & Sed (15); we will Rx w/ Tramadol50Tid prn, hot soaks, & refer to Rheum for further eval...  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  Hx of TRANSIENT ISCHEMIC ATTACK (ICD-435.9) - on ASA 81mg /d & COUMADIN> she has been followed by DrWillis; s/p left carotid endarterectomy  11/05 by Gillie Manners. ~  1/12: f/u eval by DrWillis reviewed... ~  6/13: she presented w/ ?TIA symptoms & eval was neg; review by DrWillis indicated prob Migraine & everything resolved... ~  1/15: she had another episode, Hosp by [facility] x2d but felt to prob be Migraine related... ~  11/15: she had f/u appt w/ DrWillis> Hx Ehlers-Danlos, Migraines, & c/o some leg weakness (some difficulty rising from squat); he did EMG/NCV (neg-no myopathy, no neuropathy) & rec phys therapy  Hx of SYNCOPE (ICD-780.2) - felt to be neurally mediated, ? related to long QT, no recur since pacer placed...  MIGRAINE HEADACHE >> she presented w/ ?TIA symptoms 6/13 & eval was neg; review by DrWillis indicated prob Migraine & everything resolved, no recurrence... ~  Seen by Neuro 2/15 DrWillis w/ migraines, memory deficit, abn of gait;   DEPRESSION (ICD-311) - she takes LORAZEPAM 1mg Qhs for insomnia... she prev saw DrGraves for counselling in [place]... ~  11/10: poor appetite & some wt loss prob related to depression & we discussed trial Lexapro 10mg /d... ~  3/11: she is very pleased- feels better, gained 5# w/ Ensure, etc... ~  9/11:  "I feel great" & Lexapro continued... ~  She was able to stop the Lexapro 1/12 (also stopped the Lipitor) & has done well off it ever since... ~  10/13:  She called w/ situational depression due to marital stress, getting counseling, etc; started PROZAC 20mg /d & she reports much improved... ~  2015: she is off the Prozac and using Ativan 1mg  prn... ~  9/15: on Ativan1mg  prn and CELEXA20 called in several mo ago & she is much improved...  CARCINOMA, BASAL CELL (ICD-173.9) - skin cancer removed from left leg by [facility] 2008. ~  12/11:  She reports bx of rash on legs= lichen planus per DrHall, treated w/ topical ointment... ~  2/13:  She had a skin lesion removed from her neck= basal cell ca & wide excision performed by DrLupton.  Health Maintenance: ~  GI:  followed by DrJacobs w/  colonoscopy 2/09 showing only sm hems... ~  GYN:  followed by DrMcPhail, and Dr. Illene Bolus at [facility]... ~  Labs 9/11 showed Vit D level = 39 & rec to start 1000 u OTC supplement... ~  Immunizations:  she had TDAP 3/11... she gets yearly Flu vaccine in the Fall of the yr...    Past Surgical History:  Procedure Laterality Date  . CAROTID ENDARTERECTOMY    . complex mitral valve repair     at the [facility]  . INSERT / REPLACE / REMOVE PACEMAKER    . left carotid endarterectomy  11/2003   by Dr. Delton See  . PACEMAKER INSERTION     medtronic kappa [phone]  . removed chalazion from left eye lid  09/2011   Dr. Satira Sark    Outpatient Encounter Prescriptions as of 06/12/2016  Medication Sig  . acebutolol (SECTRAL) 200 MG capsule TAKE ONE CAPSULE BY MOUTH AT BEDTIME  . apixaban (ELIQUIS) 5 MG TABS tablet Take 1 tablet (5 mg total) by mouth 2 (two) times daily.  . citalopram (CELEXA) 20 MG tablet TAKE 1 TABLET EVERY DAY  . donepezil (ARICEPT) 10 MG tablet Take 0.5 tablets (5 mg total) by mouth at bedtime.  Marland Kitchen ezetimibe (ZETIA) 10 MG tablet TAKE 1 TABLET EVERY DAY  . ipratropium (ATROVENT) 0.06 % nasal spray Place 2 sprays into both nostrils 2 (two) times daily.  Marland Kitchen levothyroxine (SYNTHROID, LEVOTHROID) 75 MCG tablet Take  1 tablet (75 mcg total) by mouth daily.  Marland Kitchen linaclotide (LINZESS) 72 MCG capsule Take 1 capsule (72 mcg total) by mouth daily before breakfast. (Patient taking differently: Take 72 mcg by mouth daily before breakfast. Pt takes 1 tablet 3 times weekly)  . LORazepam (ATIVAN) 1 MG tablet TAKE 1 TABLET 3 TIMES A DAY AS NEEDED (Patient taking differently: TAKE 1 TABLET 3 TIMES A DAY AS NEEDED Pt reports taking this at night)  . Probiotic Product (ALIGN) 4 MG CAPS Take 4 mg by mouth every morning.  . traMADol (ULTRAM) 50 MG tablet Take 1 tablet (50 mg total) by mouth 3 (three) times daily as needed for moderate pain.  . [DISCONTINUED] levothyroxine (SYNTHROID, LEVOTHROID)  75 MCG tablet TAKE 1 TABLET BY MOUTH EVERY DAY  . Azelastine-Fluticasone (DYMISTA) 137-50 MCG/ACT SUSP 1-2 sprays in each nostril BID as needed  . predniSONE (STERAPRED UNI-PAK 21 TAB) 5 MG (21) TBPK tablet Take as directed   No facility-administered encounter medications on file as of 06/12/2016.     Allergies  Allergen Reactions  . Other Other (See Comments)    All QTc prolongation drugs  . Statins Other (See Comments)    myalgias    Current Medications, Allergies, Past Medical History, Past Surgical History, Family History, and Social History were reviewed in [employer] record.    Review of Systems         See HPI - all other systems neg except as noted... The patient complains of dyspnea on exertion.  The patient denies anorexia, fever, weight loss, weight gain, vision loss, decreased hearing, hoarseness, chest pain, syncope, peripheral edema, prolonged cough, headaches, hemoptysis, abdominal pain, melena, hematochezia, severe indigestion/heartburn, hematuria, incontinence, muscle weakness, suspicious skin lesions, transient blindness, difficulty walking, depression, unusual weight change, abnormal bleeding, enlarged lymph nodes, and angioedema.     Objective:   Physical Exam     WD, Thin, 66 y/o WF in NAD... she is 5'11"Tall and 141# = BMI~20... Vital Signs:  Reviewed... GENERAL:  Alert & oriented; pleasant & cooperative... HEENT:  [place]/AT, EOM-wnl, PERRLA, EACs-clear, TMs-wnl, NOSE-clear, THROAT-clear & wnl. NECK:  Supple w/ fair ROM; no JVD; normal carotid impulses w/o bruits, left CAE scar; no thyromegaly or nodules palpated; no lymphadenopathy. CHEST:  Clear to P & A; without wheezes/ rales/ or rhonchi heard... HEART:  Regular Rhythm; pacer on left, without murmurs/ rubs/ or gallops detected... ABDOMEN:  Soft & nontender; normal bowel sounds; no organomegaly or masses palpated... EXT: without deformities or arthritic changes; no varicose veins/ venous  insuffic/ or edema. NEURO:  CN's intact;  no focal neuro deficits... DERM:  s/p skin cancer removed from left leg; min rash noted...  RADIOLOGY DATA:  Reviewed in the EPIC EMR & discussed w/ the patient...  LABORATORY DATA:  Reviewed in the EPIC EMR & discussed w/ the patient...   Assessment & Plan:    MVP/ MV Repair> Tachy-Brady/ PACER> Dysautonomia>  Followed by Cherly Hensen & DrKlein, doing well on Eliquis & Sectral, continue same...  Cerebrovasc Dis>  Off ASA now that she's on EliquisBid, s/p left CAE 11/05, no recurrent cerebral ischemic symptoms & CDopplers stable...  ?DVT while in [place]> moot issue at present; prev on Coumadin now Eliquis; leg swelling diminished on the compression hose, no salt, elevation, etc; she is back to her exercise program & back to baseline...  CHOL>  She feels much better off the Lipitor & FLP improved w/ Zetia10 started 1/15 [facility]- continue same...  Hypothyroid>  Stable  on the Synthroid 63mcg/d...  GYN>  Stable & followed both here & at [facility] (see above)...  EHLERS-DANLOS>  Aware, stable, she has been feeling better & in great spirits...  Migraines, ?TIA, ?MS, mild cognitive impairment> [facility] 6/13 & 1/15 w/ ?TIA but DrWillis thought likely Migraine syndrome- resolved, neg work up, no recurrence & they are following... Neuro> she mentions several symtoms that she is concerned about- clumsy, balance off, subjective leg weakness- & she is concerned; therefore refer to Neurology for eval... 5/17> she indicates that DrWillis Dx mild cognitive impairment & treated w/ Aricept10 & she feels much better she says  Depression>  Off Prozac20 & on Celexa20 & Ativan prn; she & husb received counseling...  Other medical problems as noted... Routine f/u & CPX 06/12/16>   For her AR, allergy symptoms we will Rx w/ the addition of Dymista & Pred Dosepak;  Her TSH is elev & I suspect compliance vs interference in absorption- rec to take 1st thing in the AM by itself & do  not eat or drink for 54min, we will recheck TSH in 59mo;  Routine check of Vit D level is low at 24 & she is rec to start VitD 2000u/d OTC supplement... She will contact GYN for routine f/u visit due    Patient's Medications  New Prescriptions   AZELASTINE-FLUTICASONE (DYMISTA) 137-50 MCG/ACT SUSP    1-2 sprays in each nostril BID as needed   PREDNISONE (STERAPRED UNI-PAK 21 TAB) 5 MG (21) TBPK TABLET    Take as directed  Previous Medications   ACEBUTOLOL (SECTRAL) 200 MG CAPSULE    TAKE ONE CAPSULE BY MOUTH AT BEDTIME   APIXABAN (ELIQUIS) 5 MG TABS TABLET    Take 1 tablet (5 mg total) by mouth 2 (two) times daily.   CITALOPRAM (CELEXA) 20 MG TABLET    TAKE 1 TABLET EVERY DAY   DONEPEZIL (ARICEPT) 10 MG TABLET    Take 0.5 tablets (5 mg total) by mouth at bedtime.   EZETIMIBE (ZETIA) 10 MG TABLET    TAKE 1 TABLET EVERY DAY   IPRATROPIUM (ATROVENT) 0.06 % NASAL SPRAY    Place 2 sprays into both nostrils 2 (two) times daily.   LINACLOTIDE (LINZESS) 72 MCG CAPSULE    Take 1 capsule (72 mcg total) by mouth daily before breakfast.   LORAZEPAM (ATIVAN) 1 MG TABLET    TAKE 1 TABLET 3 TIMES A DAY AS NEEDED   PROBIOTIC PRODUCT (ALIGN) 4 MG CAPS    Take 4 mg by mouth every morning.   TRAMADOL (ULTRAM) 50 MG TABLET    Take 1 tablet (50 mg total) by mouth 3 (three) times daily as needed for moderate pain.  Modified Medications   Modified Medication Previous Medication   LEVOTHYROXINE (SYNTHROID, LEVOTHROID) 75 MCG TABLET levothyroxine (SYNTHROID, LEVOTHROID) 75 MCG tablet      Take 1 tablet (75 mcg total) by mouth daily.    TAKE 1 TABLET BY MOUTH EVERY DAY  Discontinued Medications   No medications on file

## 2016-06-14 ENCOUNTER — Encounter (HOSPITAL_COMMUNITY): Payer: 59

## 2016-06-14 ENCOUNTER — Encounter (HOSPITAL_COMMUNITY): Payer: Self-pay | Admitting: *Deleted

## 2016-06-14 NOTE — Telephone Encounter (Signed)
Pt states she was told she would get a call back with another date before the current appointment to see Dr Jannifer Franklin. Pt is asking for a call re: wanting to know again if there is anything that could be offered to her before the 11th, she has an appointment with another Dr that she can not miss on the 4th.  I informed her that she can see a NP and she declined

## 2016-06-14 NOTE — Telephone Encounter (Signed)
Called and LVM for pt offering 06/20/16 at 12pm check in 1130am. Asked her to call back and let us know if this appt works. Gave [facility] phone number.

## 2016-06-14 NOTE — Progress Notes (Signed)
Cardiac Individual Treatment Plan  Patient Details  Name: Amy Lane MRN: [id number] Date of Birth: 1950-04-05 Referring Provider:     CARDIAC REHAB PHASE II ORIENTATION from 04/27/2016 in [place]  Referring Provider  [name] MD      Initial Encounter Date:    CARDIAC REHAB PHASE II ORIENTATION from 04/27/2016 in [place]  Date  04/27/16  Referring Provider  [name] MD      Visit Diagnosis: No diagnosis found.  Patient's Home Medications on Admission:  Current Outpatient Prescriptions:  .  acebutolol (SECTRAL) 200 MG capsule, TAKE ONE CAPSULE BY MOUTH AT BEDTIME, Disp: 90 capsule, Rfl: 1 .  apixaban (ELIQUIS) 5 MG TABS tablet, Take 1 tablet (5 mg total) by mouth 2 (two) times daily., Disp: 180 tablet, Rfl: 1 .  Azelastine-Fluticasone (DYMISTA) 137-50 MCG/ACT SUSP, 1-2 sprays in each nostril BID as needed, Disp: 23 g, Rfl: 3 .  citalopram (CELEXA) 20 MG tablet, TAKE 1 TABLET EVERY DAY, Disp: 90 tablet, Rfl: 1 .  donepezil (ARICEPT) 10 MG tablet, Take 0.5 tablets (5 mg total) by mouth at bedtime., Disp: , Rfl:  .  ezetimibe (ZETIA) 10 MG tablet, TAKE 1 TABLET EVERY DAY, Disp: 90 tablet, Rfl: 1 .  ipratropium (ATROVENT) 0.06 % nasal spray, Place 2 sprays into both nostrils 2 (two) times daily., Disp: 15 mL, Rfl: 12 .  levothyroxine (SYNTHROID, LEVOTHROID) 75 MCG tablet, Take 1 tablet (75 mcg total) by mouth daily., Disp: 90 tablet, Rfl: 3 .  linaclotide (LINZESS) 72 MCG capsule, Take 1 capsule (72 mcg total) by mouth daily before breakfast. (Patient taking differently: Take 72 mcg by mouth daily before breakfast. Pt takes 1 tablet 3 times weekly), Disp: 30 capsule, Rfl: 11 .  LORazepam (ATIVAN) 1 MG tablet, TAKE 1 TABLET 3 TIMES A DAY AS NEEDED (Patient taking differently: TAKE 1 TABLET 3 TIMES A DAY AS NEEDED Pt reports taking this at night), Disp: 90 tablet, Rfl: 5 .  predniSONE (STERAPRED UNI-PAK 21  TAB) 5 MG (21) TBPK tablet, Take as directed, Disp: 21 tablet, Rfl: 0 .  Probiotic Product (ALIGN) 4 MG CAPS, Take 4 mg by mouth every morning., Disp: , Rfl:  .  traMADol (ULTRAM) 50 MG tablet, Take 1 tablet (50 mg total) by mouth 3 (three) times daily as needed for moderate pain., Disp: 50 tablet, Rfl: 0  Past Medical History: Past Medical History:  Diagnosis Date  . Abnormal gait   . Allergic rhinitis   . Atrial fibrillation ([place])   . Carcinomas, basal cell   . Cardiac pacemaker    DDD  MDT  . Cerebrovascular disease   . Depression   . Dysautonomia   . Ehler's-Danlos syndrome   . Fibromyalgia   . Headache(784.0)    Migraine  . Hemorrhoids   . Herpes zoster   . History of syncope   . History of tachycardia-bradycardia syndrome   . History of transient ischemic attack   . Hypercholesteremia   . Hypercholesterolemia   . Hypothyroidism   . Long term (current) use of anticoagulants   . Mitral valve prolapse   . PAT (paroxysmal atrial tachycardia) ([place])   . PSVT (paroxysmal supraventricular tachycardia) ([place])   . Skin desquamation    inflammative vaginitis  . SVT (supraventricular tachycardia) ([facility])     Tobacco Use: History  Smoking Status  . Never Smoker  Smokeless Tobacco  . Never Used  Comment: smoked in her early 20's--smoked 4 cigs per year     Labs: Recent Review Flowsheet Data    Labs for ITP Cardiac and Pulmonary Rehab Latest Ref Rng & Units 01/29/2013 04/18/2013 04/06/2014 05/31/2015 06/12/2016   Cholestrol 0 - 200 mg/dL 235(H) 172 167 168 159   LDLCALC 0 - 99 mg/dL 155(H) 89 93 87 94   LDLDIRECT mg/dL - - - - -   HDL >39.00 mg/dL 64 68.70 60.60 59.60 51.00   Trlycerides 0.0 - 149.0 mg/dL 79 74.0 66.0 105.0 71.0   Hemoglobin A1c <5.7 % 6.1(H) - - - -   TCO2 0 - 100 mmol/L - - - - -      Capillary Blood Glucose: No results found for: GLUCAP   Exercise Target Goals:    Exercise Program Goal: Individual exercise prescription set with THRR, safety &  activity barriers. Participant demonstrates ability to understand and report RPE using BORG scale, to self-measure pulse accurately, and to acknowledge the importance of the exercise prescription.  Exercise Prescription Goal: Starting with aerobic activity 30 plus minutes a day, 3 days per week for initial exercise prescription. Provide home exercise prescription and guidelines that participant acknowledges understanding prior to discharge.  Activity Barriers & Risk Stratification:     Activity Barriers & Cardiac Risk Stratification - 04/27/16 0827      Activity Barriers & Cardiac Risk Stratification   Activity Barriers Arthritis;Deconditioning;Joint Problems;Other (comment);Chest Pain/Angina   Comments L knee complications, broke L foot, arthritis in R hip and neck region   Cardiac Risk Stratification High      6 Minute Walk:     6 Minute Walk    Row Name 04/27/16 1130         6 Minute Walk   Phase Initial     Distance 1621 feet     Walk Time 6 minutes     # of Rest Breaks 0     MPH 3.07     METS 4.42     RPE 11     VO2 Peak 15.49     Symptoms No     Resting HR 61 bpm     Resting BP 96/64     Max Ex. HR 115 bpm     Max Ex. BP 122/64     2 Minute Post BP 110/64        Oxygen Initial Assessment:   Oxygen Re-Evaluation:   Oxygen Discharge (Final Oxygen Re-Evaluation):   Initial Exercise Prescription:     Initial Exercise Prescription - 04/27/16 1100      Date of Initial Exercise RX and Referring Provider   Date 04/27/16   Referring Provider [name] MD     Treadmill   MPH 2.6   Grade 0   Minutes 10   METs 2.99     Recumbant Bike   Level 2   Minutes 10   METs 2     NuStep   Level 3   SPM 80   Minutes 10   METs 2     Prescription Details   Frequency (times per week) 3   Duration Progress to 30 minutes of continuous aerobic without signs/symptoms of physical distress     Intensity   THRR 40-80% of Max Heartrate 63-126   Ratings of  Perceived Exertion 11-15   Perceived Dyspnea 0-4     Progression   Progression Continue to progress workloads to maintain intensity without signs/symptoms of physical distress.  Resistance Training   Training Prescription Yes   Weight 2lbs   Reps 10-15      Perform Capillary Blood Glucose checks as needed.  Exercise Prescription Changes:     Exercise Prescription Changes    Row Name 05/15/16 1600 05/29/16 1000 05/31/16 1353         Response to Exercise   Blood Pressure (Admit) 104/60 104/60 90/62     Blood Pressure (Exercise) 108/72 130/54 108/60     Blood Pressure (Exit) 90/56  asymptomatic BP recheck 100/60 102/62  asymptomatic BP recheck 100/60 100/60     Heart Rate (Admit) 71 bpm 76 bpm 59 bpm     Heart Rate (Exercise) 100 bpm 104 bpm 123 bpm     Heart Rate (Exit) 68 bpm 75 bpm 68 bpm     Rating of Perceived Exertion (Exercise) 13 12 13      Symptoms none none none     Comments pt c/o of knee pain on Nustep, will modify equipment for comfort  - adjusted Ex Rx due to pt having on open toe shoes     Duration Continue with 30 min of aerobic exercise without signs/symptoms of physical distress. Continue with 30 min of aerobic exercise without signs/symptoms of physical distress. Continue with 30 min of aerobic exercise without signs/symptoms of physical distress.     Intensity THRR unchanged THRR unchanged THRR unchanged       Progression   Progression  - Continue to progress workloads to maintain intensity without signs/symptoms of physical distress. Continue to progress workloads to maintain intensity without signs/symptoms of physical distress.     Average METs  - 4.2 3.5       Resistance Training   Training Prescription Yes Yes Yes     Weight 3lbs 3lbs 3lbs     Reps 10-15 10-15 10-15     Time 10 Minutes 10 Minutes 10 Minutes       Treadmill   MPH 3 3.3  -     Grade 2 3  -     Minutes 10 15  -     METs 4.12 4.89  -       Recumbant Bike   Level 2.5 2.5 3      Minutes 10 15 20      METs 3 3.5 3.2       NuStep   Level 3  -  -     SPM 80  -  -     Minutes 10  -  -     METs 2.7  -  -       Track   Laps  -  - 15     Minutes  -  - 10     METs  -  - 3.79       Home Exercise Plan   Plans to continue exercise at Home (comment) Home (comment) Home (comment)  walking     Frequency Add 2 additional days to program exercise sessions. Add 2 additional days to program exercise sessions. Add 2 additional days to program exercise sessions.     Initial Home Exercises Provided 05/12/16 05/12/16 05/12/16        Exercise Comments:     Exercise Comments    Row Name 05/12/16 1516 06/13/16 1405         Exercise Comments Home exercise guidelines reviewed with participant who is currently walking 12 minutes, 3 days a week at home (T,Th, Sat). Encouraged participant to  increase duration to 12 minutes twice a day and increase by a minute each session with a goal of reaching 15 minutes twice a day, on the days she doesn't attend cardiac reahb, and participant is amenable to this. Reviewed METs and goals. Pt is tolerating exercise well; will continue to monitor exercise progression.         Exercise Goals and Review:     Exercise Goals    Row Name 04/27/16 [phone]             Exercise Goals   Increase Physical Activity Yes  Be able to climb stairs and be active for 30 minutes without symptoms of SOB       Intervention Provide advice, education, support and counseling about physical activity/exercise needs.;Develop an individualized exercise prescription for aerobic and resistive training based on initial evaluation findings, risk stratification, comorbidities and participant's personal goals.       Expected Outcomes Achievement of increased cardiorespiratory fitness and enhanced flexibility, muscular endurance and strength shown through measurements of functional capacity and personal statement of participant.       Increase Strength and Stamina Yes   Be able to walk or slightly jog for 30 minutes or more without symptoms of SOB       Intervention Provide advice, education, support and counseling about physical activity/exercise needs.;Develop an individualized exercise prescription for aerobic and resistive training based on initial evaluation findings, risk stratification, comorbidities and participant's personal goals.       Expected Outcomes Achievement of increased cardiorespiratory fitness and enhanced flexibility, muscular endurance and strength shown through measurements of functional capacity and personal statement of participant.          Exercise Goals Re-Evaluation :     Exercise Goals Re-Evaluation    Row Name 05/15/16 1655 06/13/16 1403           Exercise Goal Re-Evaluation   Exercise Goals Review Increase Physical Activity  -      Comments Pt will increase walking program to 12 minutes twice a day on those days and increase a minute each session with the goal of achieving 15 minutes twice a day 3 days per week in addition to her cardiac rehab sessions.  Pt is tolerating exercise well; however pt is limited with outdoor walking due to allergies. Discussed indoor options.       Expected Outcomes Pt will be compliant with HEP and continue to progress in cardiorespiratory fitness Pt will be compliant with HEP and continue to progress in cardiorespiratory fitness          Discharge Exercise Prescription (Final Exercise Prescription Changes):     Exercise Prescription Changes - 05/31/16 1353      Response to Exercise   Blood Pressure (Admit) 90/62   Blood Pressure (Exercise) 108/60   Blood Pressure (Exit) 100/60   Heart Rate (Admit) 59 bpm   Heart Rate (Exercise) 123 bpm   Heart Rate (Exit) 68 bpm   Rating of Perceived Exertion (Exercise) 13   Symptoms none   Comments adjusted Ex Rx due to pt having on open toe shoes   Duration Continue with 30 min of aerobic exercise without signs/symptoms of physical distress.    Intensity THRR unchanged     Progression   Progression Continue to progress workloads to maintain intensity without signs/symptoms of physical distress.   Average METs 3.5     Resistance Training   Training Prescription Yes   Weight 3lbs   Reps 10-15  Time 10 Minutes     Recumbant Bike   Level 3   Minutes 20   METs 3.2     Track   Laps 15   Minutes 10   METs 3.79     Home Exercise Plan   Plans to continue exercise at Home (comment)  walking   Frequency Add 2 additional days to program exercise sessions.   Initial Home Exercises Provided 05/12/16      Nutrition:  Target Goals: Understanding of nutrition guidelines, daily intake of sodium 1500mg , cholesterol 200mg , calories 30% from fat and 7% or less from saturated fats, daily to have 5 or more servings of fruits and vegetables.  Biometrics:     Pre Biometrics - 04/27/16 1139      Pre Biometrics   Waist Circumference 27.5 inches   Hip Circumference 39 inches   Waist to Hip Ratio 0.71 %   Triceps Skinfold 23 mm   % Body Fat 30.5 %   Grip Strength 29.5 kg   Flexibility 15 in   Single Leg Stand 30 seconds       Nutrition Therapy Plan and Nutrition Goals:     Nutrition Therapy & Goals - 05/29/16 1350      Nutrition Therapy   Diet Therapeutic Lifestyle Changes     Intervention Plan   Intervention Prescribe, educate and counsel regarding individualized specific dietary modifications aiming towards targeted core components such as weight, hypertension, lipid management, diabetes, heart failure and other comorbidities.   Expected Outcomes Short Term Goal: Understand basic principles of dietary content, such as calories, fat, sodium, cholesterol and nutrients.;Long Term Goal: Adherence to prescribed nutrition plan.      Nutrition Discharge: Nutrition Scores:     Nutrition Assessments - 05/29/16 1350      MEDFICTS Scores   Pre Score 0      Nutrition Goals Re-Evaluation:   Nutrition Goals  Re-Evaluation:   Nutrition Goals Discharge (Final Nutrition Goals Re-Evaluation):   Psychosocial: Target Goals: Acknowledge presence or absence of significant depression and/or stress, maximize coping skills, provide positive support system. Participant is able to verbalize types and ability to use techniques and skills needed for reducing stress and depression.  Initial Review & Psychosocial Screening:     Initial Psych Review & Screening - 04/27/16 1219      Initial Review   Current issues with None Identified     Family Dynamics   Good Support System? Yes     Barriers   Psychosocial barriers to participate in program There are no identifiable barriers or psychosocial needs.     Screening Interventions   Interventions Encouraged to exercise      Quality of Life Scores:     Quality of Life - 04/27/16 1150      Quality of Life Scores   Health/Function Pre 27.9 %   Socioeconomic Pre 30 %   Psych/Spiritual Pre 30 %   Family Pre 28.5 %   GLOBAL Pre 28.86 %      PHQ-9: Recent Review Flowsheet Data    Depression screen [facility] 2/9 05/01/2016   Decreased Interest 0   Down, Depressed, Hopeless 0   PHQ - 2 Score 0     Interpretation of Total Score  Total Score Depression Severity:  1-4 = Minimal depression, 5-9 = Mild depression, 10-14 = Moderate depression, 15-19 = Moderately severe depression, 20-27 = Severe depression   Psychosocial Evaluation and Intervention:     Psychosocial Evaluation - 06/14/16 7035  Psychosocial Evaluation & Interventions   Interventions Stress management education;Relaxation education;Encouraged to exercise with the program and follow exercise prescription   Comments pt demonstrates positive and healthy coping skills, interacts well with staff and fellow participants   Continue Psychosocial Services  No Follow up required      Psychosocial Re-Evaluation:   Psychosocial Discharge (Final Psychosocial Re-Evaluation):   Vocational  Rehabilitation: Provide vocational rehab assistance to qualifying candidates.   Vocational Rehab Evaluation & Intervention:     Vocational Rehab - 04/27/16 1218      Initial Vocational Rehab Evaluation & Intervention   Assessment shows need for Vocational Rehabilitation --  Mrs Krizan is retired and does not need vocational rehab at this time.      Education: Education Goals: Education classes will be provided on a weekly basis, covering required topics. Participant will state understanding/return demonstration of topics presented.  Learning Barriers/Preferences:     Learning Barriers/Preferences - 04/27/16 0826      Learning Barriers/Preferences   Learning Barriers Sight   Learning Preferences Video;Skilled Demonstration;Pictoral      Education Topics: Count Your Pulse:  -Group instruction provided by verbal instruction, demonstration, patient participation and written materials to support subject.  Instructors address importance of being able to find your pulse and how to count your pulse when at home without a heart monitor.  Patients get hands on experience counting their pulse with staff help and individually.   Heart Attack, Angina, and Risk Factor Modification:  -Group instruction provided by verbal instruction, video, and written materials to support subject.  Instructors address signs and symptoms of angina and heart attacks.    Also discuss risk factors for heart disease and how to make changes to improve heart health risk factors.   Functional Fitness:  -Group instruction provided by verbal instruction, demonstration, patient participation, and written materials to support subject.  Instructors address safety measures for doing things around the house.  Discuss how to get up and down off the floor, how to pick things up properly, how to safely get out of a chair without assistance, and balance training.   CARDIAC REHAB PHASE II EXERCISE from 05/31/2016 in [place]  Date  05/12/16  Instruction Review Code  2- meets goals/outcomes      Meditation and Mindfulness:  -Group instruction provided by verbal instruction, patient participation, and written materials to support subject.  Instructor addresses importance of mindfulness and meditation practice to help reduce stress and improve awareness.  Instructor also leads participants through a meditation exercise.    Stretching for Flexibility and Mobility:  -Group instruction provided by verbal instruction, patient participation, and written materials to support subject.  Instructors lead participants through series of stretches that are designed to increase flexibility thus improving mobility.  These stretches are additional exercise for major muscle groups that are typically performed during regular warm up and cool down.   CARDIAC REHAB PHASE II EXERCISE from 05/31/2016 in [place]  Date  05/19/16  Instruction Review Code  2- meets goals/outcomes      Hands Only CPR:  -Group verbal, video, and participation provides a basic overview of AHA guidelines for community CPR. Role-play of emergencies allow participants the opportunity to practice calling for help and chest compression technique with discussion of AED use.   Hypertension: -Group verbal and written instruction that provides a basic overview of hypertension including the most recent diagnostic guidelines, risk factor reduction with self-care instructions and  medication management.    Nutrition I class: Heart Healthy Eating:  -Group instruction provided by PowerPoint slides, verbal discussion, and written materials to support subject matter. The instructor gives an explanation and review of the Therapeutic Lifestyle Changes diet recommendations, which includes a discussion on lipid goals, dietary fat, sodium, fiber, plant stanol/sterol esters, sugar, and the components of a  well-balanced, healthy diet.   CARDIAC REHAB PHASE II EXERCISE from 05/31/2016 in [place]  Date  05/29/16  Educator  RD  Instruction Review Code  Not applicable [class handouts given]      Nutrition II class: Lifestyle Skills:  -Group instruction provided by PowerPoint slides, verbal discussion, and written materials to support subject matter. The instructor gives an explanation and review of label reading, grocery shopping for heart health, heart healthy recipe modifications, and ways to make healthier choices when eating out.   CARDIAC REHAB PHASE II EXERCISE from 05/31/2016 in [place]  Date  05/29/16  Educator  RD  Instruction Review Code  Not applicable [class handouts given]      Diabetes Question & Answer:  -Group instruction provided by PowerPoint slides, verbal discussion, and written materials to support subject matter. The instructor gives an explanation and review of diabetes co-morbidities, pre- and post-prandial blood glucose goals, pre-exercise blood glucose goals, signs, symptoms, and treatment of hypoglycemia and hyperglycemia, and foot care basics.   Diabetes Blitz:  -Group instruction provided by PowerPoint slides, verbal discussion, and written materials to support subject matter. The instructor gives an explanation and review of the physiology behind type 1 and type 2 diabetes, diabetes medications and rational behind using different medications, pre- and post-prandial blood glucose recommendations and Hemoglobin A1c goals, diabetes diet, and exercise including blood glucose guidelines for exercising safely.    Portion Distortion:  -Group instruction provided by PowerPoint slides, verbal discussion, written materials, and food models to support subject matter. The instructor gives an explanation of serving size versus portion size, changes in portions sizes over the last 20 years, and what consists of a  serving from each food group.   CARDIAC REHAB PHASE II EXERCISE from 05/31/2016 in [place]  Date  05/17/16  Educator  RD  Instruction Review Code  2- meets goals/outcomes      Stress Management:  -Group instruction provided by verbal instruction, video, and written materials to support subject matter.  Instructors review role of stress in heart disease and how to cope with stress positively.     CARDIAC REHAB PHASE II EXERCISE from 05/31/2016 in [place]  Date  05/24/16  Instruction Review Code  2- meets goals/outcomes      Exercising on Your Own:  -Group instruction provided by verbal instruction, power point, and written materials to support subject.  Instructors discuss benefits of exercise, components of exercise, frequency and intensity of exercise, and end points for exercise.  Also discuss use of nitroglycerin and activating EMS.  Review options of places to exercise outside of rehab.  Review guidelines for sex with heart disease.   Cardiac Drugs I:  -Group instruction provided by verbal instruction and written materials to support subject.  Instructor reviews cardiac drug classes: antiplatelets, anticoagulants, beta blockers, and statins.  Instructor discusses reasons, side effects, and lifestyle considerations for each drug class.   Cardiac Drugs II:  -Group instruction provided by verbal instruction and written materials to support subject.  Instructor reviews cardiac drug  classes: angiotensin converting enzyme inhibitors (ACE-I), angiotensin II receptor blockers (ARBs), nitrates, and calcium channel blockers.  Instructor discusses reasons, side effects, and lifestyle considerations for each drug class.   CARDIAC REHAB PHASE II EXERCISE from 05/31/2016 in [place]  Date  05/31/16  Instruction Review Code  2- meets goals/outcomes      Anatomy and Physiology of the Circulatory  System:  Group verbal and written instruction and models provide basic cardiac anatomy and physiology, with the coronary electrical and arterial systems. Review of: AMI, Angina, Valve disease, Heart Failure, Peripheral Artery Disease, Cardiac Arrhythmia, Pacemakers, and the ICD.   Other Education:  -Group or individual verbal, written, or video instructions that support the educational goals of the cardiac rehab program.   Knowledge Questionnaire Score:     Knowledge Questionnaire Score - 04/27/16 1130      Knowledge Questionnaire Score   Pre Score 21/24      Core Components/Risk Factors/Patient Goals at Admission:     Personal Goals and Risk Factors at Admission - 04/27/16 1107      Core Components/Risk Factors/Patient Goals on Admission    Weight Management Yes;Weight Loss;Weight Maintenance   Intervention Weight Management: Develop a combined nutrition and exercise program designed to reach desired caloric intake, while maintaining appropriate intake of nutrient and fiber, sodium and fats, and appropriate energy expenditure required for the weight goal.;Weight Management: Provide education and appropriate resources to help participant work on and attain dietary goals.;Weight Management/Obesity: Establish reasonable short term and long term weight goals.   Expected Outcomes Long Term: Adherence to nutrition and physical activity/exercise program aimed toward attainment of established weight goal;Short Term: Continue to assess and modify interventions until short term weight is achieved;Weight Maintenance: Understanding of the daily nutrition guidelines, which includes 25-35% calories from fat, 7% or less cal from saturated fats, less than 200mg  cholesterol, less than 1.5gm of sodium, & 5 or more servings of fruits and vegetables daily;Weight Loss: Understanding of general recommendations for a balanced deficit meal plan, which promotes 1-2 lb weight loss per week and includes a negative  energy balance of [phone] kcal/d;Understanding recommendations for meals to include 15-35% energy as protein, 25-35% energy from fat, 35-60% energy from carbohydrates, less than 200mg  of dietary cholesterol, 20-35 gm of total fiber daily;Understanding of distribution of calorie intake throughout the day with the consumption of 4-5 meals/snacks   Lipids Yes   Intervention Provide education and support for participant on nutrition & aerobic/resistive exercise along with prescribed medications to achieve LDL 70mg , HDL >40mg .   Expected Outcomes Short Term: Participant states understanding of desired cholesterol values and is compliant with medications prescribed. Participant is following exercise prescription and nutrition guidelines.;Long Term: Cholesterol controlled with medications as prescribed, with individualized exercise RX and with personalized nutrition plan. Value goals: LDL < 70mg , HDL > 40 mg.      Core Components/Risk Factors/Patient Goals Review:      Goals and Risk Factor Review    Row Name 05/17/16 1646             Core Components/Risk Factors/Patient Goals Review   Personal Goals Review Weight Management/Obesity;Lipids       Review Making progress toward meeting goals.  Pt has lost 1 kg since starting cardiac rehab.          Core Components/Risk Factors/Patient Goals at Discharge (Final Review):      Goals and Risk Factor Review - 05/17/16 1646      Core Components/Risk  Factors/Patient Goals Review   Personal Goals Review Weight Management/Obesity;Lipids   Review Making progress toward meeting goals.  Pt has lost 1 kg since starting cardiac rehab.      ITP Comments:     ITP Comments    Row Name 04/27/16 [phone]           ITP Comments Medical Director, Dr. Fransico Him          Comments:  Pt is making expected progress toward personal goals after completing 14 sessions. Pt is out due to allergies but hopes to return on Friday.Pyschosocial Assessment Pt  demonstrates positive and healthy outlook on life, pt has supportive husband. Pt has no identifiable barriers to participating in cardiac rehab.  Recommend continued exercise and life style modification education including  stress management and relaxation techniques to decrease cardiac risk profile. Pyschosocial Assessment Pt demonstrates positive and healthy outlook on life, pt has supportive husband. Pt has no identifiable barriers to participating in cardiac rehab.

## 2016-06-14 NOTE — Telephone Encounter (Signed)
Pt returned RN's call. Appt scheduled with Dr Viona Gilmore on 5/29 @ 12.

## 2016-06-14 NOTE — Telephone Encounter (Signed)
Noted, thank you

## 2016-06-16 ENCOUNTER — Encounter (HOSPITAL_COMMUNITY): Payer: 59

## 2016-06-20 ENCOUNTER — Ambulatory Visit (INDEPENDENT_AMBULATORY_CARE_PROVIDER_SITE_OTHER): Payer: PRIVATE HEALTH INSURANCE | Admitting: Neurology

## 2016-06-20 ENCOUNTER — Encounter: Payer: Self-pay | Admitting: Neurology

## 2016-06-20 VITALS — BP 105/64 | HR 78 | Ht 70.5 in | Wt 140.5 lb

## 2016-06-20 DIAGNOSIS — Q796 Ehlers-Danlos syndrome, unspecified: Secondary | ICD-10-CM

## 2016-06-20 DIAGNOSIS — R413 Other amnesia: Secondary | ICD-10-CM | POA: Diagnosis not present

## 2016-06-20 DIAGNOSIS — I679 Cerebrovascular disease, unspecified: Secondary | ICD-10-CM | POA: Diagnosis not present

## 2016-06-20 MED ORDER — DONEPEZIL HCL 5 MG PO TABS: 5.0000 mg | ORAL_TABLET | Freq: Every day | ORAL | 3 refills | Status: DC

## 2016-06-20 NOTE — Progress Notes (Signed)
Reason for visit: Cerebrovascular disease, memory disturbance  Amy Lane is an 66 y.o. female  History of present illness:  Amy Lane is a 66 year old right-handed white female with a history of a mild memory disturbance, and a history of cerebrovascular disease. The patient is doing quite well at this time, she is on low-dose Aricept and she feels that this has been helpful for her. The patient feels better now than she has in many years. The patient is planning a trip to [place] within the next week. She will be gone for one month. She has some intermittent episodes of clumsiness, she has had a fall within the last several weeks, bruising the left leg. The patient returns to the office today for an evaluation. She is engaged in cardiac rehabilitation which he believes has been very helpful.   Past Medical History:  Diagnosis Date  . Abnormal gait   . Allergic rhinitis   . Atrial fibrillation ([place])   . Carcinomas, basal cell   . Cardiac pacemaker    DDD  MDT  . Cerebrovascular disease   . Depression   . Dysautonomia   . Ehler's-Danlos syndrome   . Fibromyalgia   . Headache(784.0)    Migraine  . Hemorrhoids   . Herpes zoster   . History of syncope   . History of tachycardia-bradycardia syndrome   . History of transient ischemic attack   . Hypercholesteremia   . Hypercholesterolemia   . Hypothyroidism   . Long term (current) use of anticoagulants   . Mitral valve prolapse   . PAT (paroxysmal atrial tachycardia) ([place])   . PSVT (paroxysmal supraventricular tachycardia) ([place])   . Skin desquamation    inflammative vaginitis  . SVT (supraventricular tachycardia) ([facility])     Past Surgical History:  Procedure Laterality Date  . CAROTID ENDARTERECTOMY    . complex mitral valve repair     at the [facility]  . INSERT / REPLACE / REMOVE PACEMAKER    . left carotid endarterectomy  11/2003   by Dr. Delton See  . PACEMAKER INSERTION     medtronic kappa 901  .  removed chalazion from left eye lid  09/2011   Dr. Satira Sark    Family History  Problem Relation Age of Onset  . Lung cancer Mother   . Prostate cancer Father   . Alzheimer's disease Father   . Heart disease Father   . Ehlers-Danlos syndrome Father   . Mitral valve prolapse Brother   . Diabetes Sister   . Mitral valve prolapse Sister   . Leukemia Other        Nephew  . Heart disease Unknown        entire maternal side of family  . Cancer Unknown        entire maternal side of family    Social history:  reports that she has never smoked. She has never used smokeless tobacco. She reports that she does not drink alcohol or use drugs.    Allergies  Allergen Reactions  . Other Other (See Comments)    All QTc prolongation drugs  . Statins Other (See Comments)    myalgias    Medications:  Prior to Admission medications   Medication Sig Start Date End Date Taking? Authorizing Provider  acebutolol (SECTRAL) 200 MG capsule TAKE ONE CAPSULE BY MOUTH AT BEDTIME 02/18/16  Yes Josue Hector, MD  apixaban (ELIQUIS) 5 MG TABS tablet Take 1 tablet (5 mg total) by mouth  2 (two) times daily. 03/13/16  Yes Josue Hector, MD  Azelastine-Fluticasone [facility] 137-50 MCG/ACT SUSP 1-2 sprays in each nostril BID as needed 06/12/16  Yes Noralee Space, MD  citalopram (CELEXA) 20 MG tablet TAKE 1 TABLET EVERY DAY 09/06/15  Yes Noralee Space, MD  donepezil (ARICEPT) 10 MG tablet Take 0.5 tablets (5 mg total) by mouth at bedtime. 06/15/15  Yes Kathrynn Ducking, MD  ezetimibe (ZETIA) 10 MG tablet TAKE 1 TABLET EVERY DAY 02/25/16  Yes Noralee Space, MD  ipratropium (ATROVENT) 0.06 % nasal spray Place 2 sprays into both nostrils 2 (two) times daily. 12/14/15  Yes Noralee Space, MD  levothyroxine (SYNTHROID, LEVOTHROID) 75 MCG tablet Take 1 tablet (75 mcg total) by mouth daily. 06/12/16  Yes Noralee Space, MD  linaclotide Rolan Lipa) 72 MCG capsule Take 1 capsule (72 mcg total) by mouth daily before  breakfast. Patient taking differently: Take 72 mcg by mouth daily before breakfast. Pt takes 1 tablet 3 times weekly 06/16/15  Yes Milus Banister, MD  LORazepam (ATIVAN) 1 MG tablet TAKE 1 TABLET 3 TIMES A DAY AS NEEDED Patient taking differently: TAKE 1 TABLET 3 TIMES A DAY AS NEEDED Pt reports taking this at night 11/09/15  Yes Noralee Space, MD  predniSONE (STERAPRED UNI-PAK 21 TAB) 5 MG (21) TBPK tablet Take as directed 06/12/16  Yes Noralee Space, MD  Probiotic Product (ALIGN) 4 MG CAPS Take 4 mg by mouth every morning.   Yes [provider]  traMADol (ULTRAM) 50 MG tablet Take 1 tablet (50 mg total) by mouth 3 (three) times daily as needed for moderate pain. 11/30/14  Yes Noralee Space, MD    ROS:  Out of a complete 14 system review of symptoms, the patient complains only of the following symptoms, and all other reviewed systems are negative.  Mild memory disturbance  Blood pressure 105/64, pulse 78, height 5' 10.5" (1.791 m), weight 140 lb 8 oz (63.7 kg).  Physical Exam  General: The patient is alert and cooperative at the time of the examination.  Skin: No significant peripheral edema is noted.   Neurologic Exam  Mental status: The patient is alert and oriented x 3 at the time of the examination. The patient has apparent normal recent and remote memory, with an apparently normal attention span and concentration ability. Mini-Mental Status Examination done today shows a total score 28/30.   Cranial nerves: Facial symmetry is present. Speech is normal, no aphasia or dysarthria is noted. Extraocular movements are full. Visual fields are full.  Motor: The patient has good strength in all 4 extremities.  Sensory examination: Soft touch sensation is symmetric on the face, arms, and legs.  Coordination: The patient has good finger-nose-finger and heel-to-shin bilaterally.  Gait and station: The patient has a normal gait. Tandem gait is normal. Romberg is negative. No  drift is seen.  Reflexes: Deep tendon reflexes are symmetric.   Assessment/Plan:  1. Ehlers-Danlos syndrome  2. History of migraine headache  3. Cerebrovascular disease  4. Mild memory disturbance  The patient appears to be doing quite well at this time, a prescription was given for the Aricept taking 5 mg at night. The patient will follow-up in one year, sooner if needed.  [name] MD 06/20/2016 12:10 PM  [facility] [address] [place], [place] [address]  Phone [phone] Fax [phone]

## 2016-06-21 ENCOUNTER — Encounter (HOSPITAL_COMMUNITY)
Admission: RE | Admit: 2016-06-21 | Discharge: 2016-06-21 | Disposition: A | Discharge status: Home / Self Care | Payer: 59 | Source: Ambulatory Visit | Attending: Cardiovascular Disease | Admitting: Cardiovascular Disease

## 2016-06-21 DIAGNOSIS — R000 Tachycardia, unspecified: Principal | ICD-10-CM

## 2016-06-21 DIAGNOSIS — G90A Postural orthostatic tachycardia syndrome (POTS): Secondary | ICD-10-CM

## 2016-06-21 DIAGNOSIS — I951 Orthostatic hypotension: Principal | ICD-10-CM

## 2016-06-23 ENCOUNTER — Encounter (HOSPITAL_COMMUNITY): Payer: 59

## 2016-06-26 ENCOUNTER — Encounter (HOSPITAL_COMMUNITY)
Admission: RE | Admit: 2016-06-26 | Discharge: 2016-06-26 | Disposition: A | Discharge status: Home / Self Care | Payer: 59 | Source: Ambulatory Visit | Attending: Cardiovascular Disease | Admitting: Cardiovascular Disease

## 2016-06-26 ENCOUNTER — Ambulatory Visit: Payer: PRIVATE HEALTH INSURANCE | Admitting: Neurology

## 2016-06-26 DIAGNOSIS — G90A Postural orthostatic tachycardia syndrome (POTS): Secondary | ICD-10-CM

## 2016-06-26 DIAGNOSIS — I951 Orthostatic hypotension: Secondary | ICD-10-CM | POA: Diagnosis present

## 2016-06-26 DIAGNOSIS — R000 Tachycardia, unspecified: Secondary | ICD-10-CM | POA: Diagnosis present

## 2016-06-28 ENCOUNTER — Encounter (HOSPITAL_COMMUNITY)
Admission: RE | Admit: 2016-06-28 | Discharge: 2016-06-28 | Disposition: A | Discharge status: Home / Self Care | Payer: 59 | Source: Ambulatory Visit | Attending: Cardiovascular Disease | Admitting: Cardiovascular Disease

## 2016-06-28 VITALS — Ht 70.5 in | Wt 141.3 lb

## 2016-06-28 DIAGNOSIS — G90A Postural orthostatic tachycardia syndrome (POTS): Secondary | ICD-10-CM

## 2016-06-28 DIAGNOSIS — R000 Tachycardia, unspecified: Principal | ICD-10-CM

## 2016-06-28 DIAGNOSIS — I951 Orthostatic hypotension: Principal | ICD-10-CM

## 2016-06-30 ENCOUNTER — Encounter (HOSPITAL_COMMUNITY)
Admission: RE | Admit: 2016-06-30 | Discharge: 2016-06-30 | Disposition: A | Discharge status: Home / Self Care | Payer: 59 | Source: Ambulatory Visit | Attending: Cardiovascular Disease | Admitting: Cardiovascular Disease

## 2016-06-30 DIAGNOSIS — I951 Orthostatic hypotension: Principal | ICD-10-CM

## 2016-06-30 DIAGNOSIS — R000 Tachycardia, unspecified: Principal | ICD-10-CM

## 2016-06-30 DIAGNOSIS — G90A Postural orthostatic tachycardia syndrome (POTS): Secondary | ICD-10-CM

## 2016-06-30 NOTE — Progress Notes (Signed)
Discharge Summary  Patient Details  Name: Amy Lane MRN: [id number] Date of Birth: [birth year] Referring Provider:     [place] from 04/27/2016 in [place]  Referring Provider  [name] MD       Number of Visits: 17/24(per insurance)  Reason for Discharge:  Patient reached a stable level of exercise. Patient independent in their exercise. Early Exit:  Pt is traveling to [place] for one month and elected to graduate [facility]  Smoking History:  History  Smoking Status  . Never Smoker  Smokeless Tobacco  . Never Used    Comment: smoked in her early 20's--smoked 4 cigs per year     Diagnosis:  POTS  ADL UCSD:   Initial Exercise Prescription:     Initial Exercise Prescription - 04/27/16 1100      Date of Initial Exercise RX and Referring Provider   Date 04/27/16   Referring Provider [name] MD     Treadmill   MPH 2.6   Grade 0   Minutes 10   METs 2.99     Recumbant Bike   Level 2   Minutes 10   METs 2     NuStep   Level 3   SPM 80   Minutes 10   METs 2     Prescription Details   Frequency (times per week) 3   Duration Progress to 30 minutes of continuous aerobic without signs/symptoms of physical distress     Intensity   THRR 40-80% of Max Heartrate 63-126   Ratings of Perceived Exertion 11-15   Perceived Dyspnea 0-4     Progression   Progression Continue to progress workloads to maintain intensity without signs/symptoms of physical distress.     Resistance Training   Training Prescription Yes   Weight 2lbs   Reps 10-15      Discharge Exercise Prescription (Final Exercise Prescription Changes):     Exercise Prescription Changes - 06/28/16 1626      Response to Exercise   Blood Pressure (Admit) 106/54   Blood Pressure (Exercise) 146/68   Blood Pressure (Exit) 118/70   Heart Rate (Admit) 66 bpm   Heart Rate (Exercise) 125 bpm   Heart Rate (Exit) 72 bpm   Rating  of Perceived Exertion (Exercise) 10   Symptoms none   Duration Continue with 30 min of aerobic exercise without signs/symptoms of physical distress.   Intensity THRR unchanged     Progression   Progression Continue to progress workloads to maintain intensity without signs/symptoms of physical distress.   Average METs 4.6     Resistance Training   Training Prescription Yes   Weight 4lbs   Reps 10-15   Time 10 Minutes     Treadmill   MPH 3.7   Grade 4   Minutes 10   METs 5.87     Recumbant Bike   Level 2.7   Minutes 10   METs 3.3     Rower   Level 2   Watts 19   Minutes 10   METs 4.6     Home Exercise Plan   Plans to continue exercise at Home (comment)  walking   Frequency Add 2 additional days to program exercise sessions.   Initial Home Exercises Provided 05/12/16      Functional Capacity:     6 Minute Walk    Row Name 04/27/16 1130 06/28/16 1513       6 Minute  Walk   Phase Initial Discharge    Distance 1621 feet 1800 feet    Distance % Change  - 11.04 %    Walk Time 6 minutes 6 minutes    # of Rest Breaks 0 0    MPH 3.07 3.4    METS 4.42 5.07    RPE 11 10    VO2 Peak 15.49 17.76    Symptoms No No    Resting HR 61 bpm 66 bpm    Resting BP 96/64 106/54    Max Ex. HR 115 bpm 125 bpm    Max Ex. BP 122/64 146/68    2 Minute Post BP 110/64 126/76       Psychological, QOL, Others - Outcomes: PHQ 2/9: Depression screen [facility] 2/9 06/30/2016 05/01/2016  Decreased Interest 0 0  Down, Depressed, Hopeless 0 0  PHQ - 2 Score 0 0  Some recent data might be hidden    Quality of Life:     Quality of Life - 07/14/16 1058      Quality of Life Scores   Health/Function Pre 27.9 %   Health/Function Post 28.7 %   Health/Function % Change 2.87 %   Socioeconomic Pre 30 %   Socioeconomic Post 30 %   Socioeconomic % Change  0 %   Psych/Spiritual Pre 30 %   Psych/Spiritual Post 30 %   Psych/Spiritual % Change 0 %   Family Pre 28.5 %   Family Post 30 %    Family % Change 5.26 %   GLOBAL Pre 28.86 %   GLOBAL Post 29.39 %   GLOBAL % Change 1.84 %      Personal Goals: Goals established at orientation with interventions provided to work toward goal.     Personal Goals and Risk Factors at Admission - 04/27/16 1107      Core Components/Risk Factors/Patient Goals on Admission    Weight Management Yes;Weight Loss;Weight Maintenance   Intervention Weight Management: Develop a combined nutrition and exercise program designed to reach desired caloric intake, while maintaining appropriate intake of nutrient and fiber, sodium and fats, and appropriate energy expenditure required for the weight goal.;Weight Management: Provide education and appropriate resources to help participant work on and attain dietary goals.;Weight Management/Obesity: Establish reasonable short term and long term weight goals.   Expected Outcomes Long Term: Adherence to nutrition and physical activity/exercise program aimed toward attainment of established weight goal;Short Term: Continue to assess and modify interventions until short term weight is achieved;Weight Maintenance: Understanding of the daily nutrition guidelines, which includes 25-35% calories from fat, 7% or less cal from saturated fats, less than 278m cholesterol, less than 1.5gm of sodium, & 5 or more servings of fruits and vegetables daily;Weight Loss: Understanding of general recommendations for a balanced deficit meal plan, which promotes 1-2 lb weight loss per week and includes a negative energy balance of [phone] kcal/d;Understanding recommendations for meals to include 15-35% energy as protein, 25-35% energy from fat, 35-60% energy from carbohydrates, less than 2085mof dietary cholesterol, 20-35 gm of total fiber daily;Understanding of distribution of calorie intake throughout the day with the consumption of 4-5 meals/snacks   Lipids Yes   Intervention Provide education and support for participant on nutrition &  aerobic/resistive exercise along with prescribed medications to achieve LDL <7062mHDL >30m52m Expected Outcomes Short Term: Participant states understanding of desired cholesterol values and is compliant with medications prescribed. Participant is following exercise prescription and nutrition guidelines.;Long Term: Cholesterol controlled with medications  as prescribed, with individualized exercise RX and with personalized nutrition plan. Value goals: LDL < 64m, HDL > 40 mg.       Personal Goals Discharge:     Goals and Risk Factor Review    Row Name 05/17/16 1646             Core Components/Risk Factors/Patient Goals Review   Personal Goals Review Weight Management/Obesity;Lipids       Review Making progress toward meeting goals.  Pt has lost 1 kg since starting cardiac rehab.          Nutrition & Weight - Outcomes:     Pre Biometrics - 04/27/16 1139      Pre Biometrics   Waist Circumference 27.5 inches   Hip Circumference 39 inches   Waist to Hip Ratio 0.71 %   Triceps Skinfold 23 mm   % Body Fat 30.5 %   Grip Strength 29.5 kg   Flexibility 15 in   Single Leg Stand 30 seconds         Post Biometrics - 06/28/16 1515       Post  Biometrics   Height 5' 10.5" (1.791 m)   Weight 141 lb 5 oz (64.1 kg)   Waist Circumference 27.75 inches   Hip Circumference 41 inches   Waist to Hip Ratio 0.68 %   BMI (Calculated) 20   Triceps Skinfold 29 mm   % Body Fat 31.7 %   Grip Strength 31 kg   Flexibility 21 in   Single Leg Stand 30 seconds      Nutrition:     Nutrition Therapy & Goals - 05/29/16 1350      Nutrition Therapy   Diet Therapeutic Lifestyle Changes     Intervention Plan   Intervention Prescribe, educate and counsel regarding individualized specific dietary modifications aiming towards targeted core components such as weight, hypertension, lipid management, diabetes, heart failure and other comorbidities.   Expected Outcomes Short Term Goal: Understand  basic principles of dietary content, such as calories, fat, sodium, cholesterol and nutrients.;Long Term Goal: Adherence to prescribed nutrition plan.      Nutrition Discharge:     Nutrition Assessments - 05/29/16 1350      MEDFICTS Scores   Pre Score 0      Education Questionnaire Score:     Knowledge Questionnaire Score - 06/30/16 1217      Knowledge Questionnaire Score   Post Score 22/24      Goals reviewed with patient;  Pt graduated from cardiac rehab program today with completion of 17 out of 24 (per insurance) exercise sessions in Phase II. Pt maintained good attendance and progressed nicely during herparticipation in rehab as evidenced by increased MET level.   Medication list reconciled. Repeat  PHQ score-0. Pt completed post assessment quality of life.  Pt scored the following     Quality of Life - 07/14/16 1058      Quality of Life Scores   Health/Function Pre 27.9 %   Health/Function Post 28.7 %   Health/Function % Change 2.87 %   Socioeconomic Pre 30 %   Socioeconomic Post 30 %   Socioeconomic % Change  0 %   Psych/Spiritual Pre 30 %   Psych/Spiritual Post 30 %   Psych/Spiritual % Change 0 %   Family Pre 28.5 %   Family Post 30 %   Family % Change 5.26 %   GLOBAL Pre 28.86 %   GLOBAL Post 29.39 %   GLOBAL %  Change 1.84 %     Pt has made significant lifestyle changes and should be commended for her success. Pt feels she has achieved his goals during cardiac rehab.  Pt feels more confident in her health and overall well being.  Pt is looking forward to her trip to [place].  Pt remarks just 6 months ago she could not even fathom the possibility of taking such a extensive and extended trip.  Pt is very grateful to cardiac rehab.  Pt plans to continue exercise in cardiac maintenance program once she returns from her trip to [place]. We enjoyed having this delightful patient in our cardiac rehab program and we are looking forward to continuing to work with her in  the maintenance program. Maurice Small RN, BSN Cardiac and Training and development officer

## 2016-07-03 ENCOUNTER — Encounter (HOSPITAL_COMMUNITY): Payer: 59

## 2016-07-03 ENCOUNTER — Other Ambulatory Visit: Payer: Self-pay | Admitting: Pulmonary Disease

## 2016-07-05 ENCOUNTER — Encounter (HOSPITAL_COMMUNITY): Payer: 59

## 2016-07-07 ENCOUNTER — Encounter (HOSPITAL_COMMUNITY): Payer: 59

## 2016-07-10 ENCOUNTER — Encounter (HOSPITAL_COMMUNITY): Payer: 59

## 2016-07-12 ENCOUNTER — Encounter (HOSPITAL_COMMUNITY): Payer: 59

## 2016-07-14 ENCOUNTER — Encounter (HOSPITAL_COMMUNITY): Payer: 59

## 2016-07-14 NOTE — Addendum Note (Signed)
Encounter addended by: Dorna Bloom D on: 07/14/2016 11:14 AM<BR>    Actions taken: Flowsheet data copied forward, Visit Navigator Flowsheet section accepted

## 2016-07-17 ENCOUNTER — Encounter (HOSPITAL_COMMUNITY): Payer: 59

## 2016-07-19 ENCOUNTER — Encounter (HOSPITAL_COMMUNITY): Payer: 59

## 2016-07-21 ENCOUNTER — Encounter (HOSPITAL_COMMUNITY): Payer: 59

## 2016-07-24 ENCOUNTER — Encounter (HOSPITAL_COMMUNITY): Payer: 59

## 2016-07-25 ENCOUNTER — Ambulatory Visit (INDEPENDENT_AMBULATORY_CARE_PROVIDER_SITE_OTHER): Payer: 59 | Admitting: Pulmonary Disease

## 2016-07-25 ENCOUNTER — Ambulatory Visit (INDEPENDENT_AMBULATORY_CARE_PROVIDER_SITE_OTHER)
Admission: RE | Admit: 2016-07-25 | Discharge: 2016-07-25 | Disposition: A | Discharge status: Home / Self Care | Payer: 59 | Source: Ambulatory Visit | Attending: Pulmonary Disease | Admitting: Pulmonary Disease

## 2016-07-25 ENCOUNTER — Other Ambulatory Visit (INDEPENDENT_AMBULATORY_CARE_PROVIDER_SITE_OTHER): Payer: 59

## 2016-07-25 ENCOUNTER — Encounter (HOSPITAL_COMMUNITY): Payer: Self-pay | Admitting: *Deleted

## 2016-07-25 VITALS — BP 112/62 | HR 61 | Temp 97.4°F | Ht 70.5 in | Wt 137.5 lb

## 2016-07-25 DIAGNOSIS — J069 Acute upper respiratory infection, unspecified: Secondary | ICD-10-CM

## 2016-07-25 DIAGNOSIS — E559 Vitamin D deficiency, unspecified: Secondary | ICD-10-CM

## 2016-07-25 DIAGNOSIS — E039 Hypothyroidism, unspecified: Secondary | ICD-10-CM

## 2016-07-25 LAB — CBC WITH DIFFERENTIAL/PLATELET
Basophils Absolute: 0 10*3/uL (ref 0.0–0.1)
Basophils Relative: 0.4 % (ref 0.0–3.0)
EOS ABS: 0.2 10*3/uL (ref 0.0–0.7)
Eosinophils Relative: 2.1 % (ref 0.0–5.0)
HCT: 40.2 % (ref 36.0–46.0)
HEMOGLOBIN: 13.7 g/dL (ref 12.0–15.0)
Lymphocytes Relative: 15.7 % (ref 12.0–46.0)
Lymphs Abs: 1.1 10*3/uL (ref 0.7–4.0)
MCHC: 34 g/dL (ref 30.0–36.0)
MCV: 91.7 fl (ref 78.0–100.0)
Monocytes Absolute: 0.6 10*3/uL (ref 0.1–1.0)
Monocytes Relative: 8 % (ref 3.0–12.0)
Neutro Abs: 5.4 10*3/uL (ref 1.4–7.7)
Neutrophils Relative %: 73.8 % (ref 43.0–77.0)
Platelets: 207 10*3/uL (ref 150.0–400.0)
RBC: 4.38 Mil/uL (ref 3.87–5.11)
RDW: 13.2 % (ref 11.5–15.5)
WBC: 7.3 10*3/uL (ref 4.0–10.5)

## 2016-07-25 LAB — SEDIMENTATION RATE: Sed Rate: 26 mm/hr (ref 0–30)

## 2016-07-25 LAB — TSH: TSH: 8.43 u[IU]/mL — AB (ref 0.35–4.50)

## 2016-07-25 MED ORDER — PREDNISONE 5 MG (21) PO TBPK: ORAL_TABLET | ORAL | 0 refills | Status: DC

## 2016-07-25 MED ORDER — LEVOFLOXACIN 500 MG PO TABS: 500.0000 mg | ORAL_TABLET | Freq: Every day | ORAL | 0 refills | Status: DC

## 2016-07-25 MED ORDER — METHYLPREDNISOLONE ACETATE 80 MG/ML IJ SUSP
80.0000 mg | Freq: Once | INTRAMUSCULAR | Status: AC
  Administered 2016-07-25: 80 mg via INTRAMUSCULAR

## 2016-07-25 NOTE — Patient Instructions (Signed)
Today we updated your med list in our EPIC system...    Continue your current medications the same...  Today we checked some follow up blood work & a CXR to rule out pneumonia...    We will contact you w/ the results when available...   Start on the [facility] Antibiotic taking one tab daily...  Today we gave you a Depo shot... Starting tomorrow in the AM take the [place] as directed on the pack...  You may use OTC Robitussin, Delsym, Mucinex, etc for the cough if needed...  Rest at home & drink plenty of fluids...  Call for any questions or if we can be of service in any way.Marland KitchenMarland Kitchen

## 2016-07-26 ENCOUNTER — Encounter: Payer: Self-pay | Admitting: Pulmonary Disease

## 2016-07-26 DIAGNOSIS — E559 Vitamin D deficiency, unspecified: Secondary | ICD-10-CM | POA: Insufficient documentation

## 2016-07-26 NOTE — Progress Notes (Addendum)
Subjective:    Patient ID: Amy Lane, female    DOB: 12-28-1950, 66 y.o.   MRN: [id number]  HPI 66 y/o WF here for a follow up visit... she has mult med problems as noted below... she is followed regularly by Drs Maryellen Pile for Cardiology, and Drs Jeri Modena for GI... ~  SEE PREV EPIC NOTES FOR OLDER DATA >>     LABS 4/14:  FLP- not at goals on diet alone but intol to all meds;  Chems- wnl;  CBC- wnl;  TSH=3.10;  VitD=19 & Rec VitD OTC supplement ~2000u daily...  April 11, 2013:  Amy Lane was [facility] 1/6 - 01/30/13 by [facility] w/ HA & altered mental status- ?TIA vs complex migraine- CT Head was neg; CDopplers were ok; 2DEcho showed norm LVF, myxomatous MV s/p repair no resid MR; Coumadin was continued and ASA81 was added... She had ROV DrWillis 2/15> he felt the episode was a migraine equiv & no recurrent prob since then, she notes mild memory impairment w/ MMSE= 29/30... She notes that she's been under a lot of stress w/ father-in-law illness, husb w/ prostate ca, sold house & had to move...   CXR 01/28/13 showed norm heart size, pacemeaker & sternal wires, hyperinflation and asymmetric biapical pleural thickening L>R, NAD...  2DEcho 01/28/13 showed norm LV size & function w/ EF=55-60%, myxomatous MV s/p MVrepair & no resid MR, mild LA dil...  LABS 4/15:  FLP- at goals on Zetia10;  Chems- wnl;  CBC- wnl;  TSH=4.99... ~  [id number]:  Amy Lane has been under some stress- husb Fritz Pickerel) was Dx w/ recurrent prostate cancer at the [facility] & underwent XRT (they had to live in [place] for 15mo while he was receiving therapy);   LABS 3/16:  FLP- at goals on Zetia10;  Chems- wnl;  CBC- wnl;  TSH=3.24   Sed=15;  Anti-CCP=neg   ~  November 30, 2014:  69mo ROV & Amy Lane report a good interval overall- she had some constipation, went to the ER (given BE prep)/ then on to see DrJacobs in GI & started on [id number] which is working very well for her;  We reviewed the following medical problems during today's office visit  >>     AR> on Benedryl OTC & Nasonex prn; breathing is good w/o cough, sput, SOB, etc...    Hx Dysautonomia (POTS) & Ehlers-Danlos syndrome> followed by DrNishan/DrKlein & DrHawkes (seen 4/16 w/ hand pain/weakness, polyarthralgia, OA); BP= 118/70 w/o postural change, on Eliquis5Bid...    MVP, Myxomatous valve- s/p MVrepair (in [place])> on Eliquis5Bid; she is followed by DrKlein- seen 4/16 & stable pacer & no changes made...    Hx Tachy-brady, Fib-flutter w/ ablation & pacer> on Sectral200/d, & Eliquis5Bid; she prev did her own Protimes at home & called to the CC for adjustments=> changed to Eliquis5Bid.    Cerebrovasc Dis> on Eliquis5Bid, s/p left CAE 2005 by [facility] for asymptomatic left carotid stenosis w/ ulceration, & f/u CDopplers all ok...    ?DVT & ?PE while on Coumadin (during trip to [place])> she then checked her own protimes like they do in [place]...    CHOL> on Zetia10 since 1/15 Hosp where LDL was 155; FLP on Zetia10 3/16 shows TChol 167, TG 66, HDL 61, LDL 93; she is intol to all other meds...    Hypothy> on Synthroid75; lab 3/16 shows TSH= 3.24; continue same meds, take pill first thing in morn, etc...    Underweight> BMI in the 19-20 range & we discussed  nutritional supplements etc...     GI- Constip & Hems> on Align; c/o constip & now improved on Linzess per DrJacobs; last colon 2009 by DrJacobs w/ hems only & f/u planned 8yrs...    GYN- kraurosis vulvae (DIV)> local GYN, [facility] vulva clinic treatment & she is up to date on screening; Hx enterococcus UTI...    Migraine HAs & ?TIA> she is followed by [facility]- DrWillis; Hosp 1/15 w/ another episode, symptoms cleared, ASA81 added to her Coumadin which was later changed to Eliquis5Bid; she had c/o some leg weakness but EMG/NCV was normal & rec to try PT...    Anxiety/ Depression> on Ativan 1mg  prn & Celexa20 which she really thinks is helping...  EXAM shows Afeb, VSS, O2sat=98% on RA;  Marfanoid habitus;  HEENT- neg,  mallampati2;  Chest- clear w/o w/r/r;  Heart- RR w/o m/r/g;  Abd- soft, nontender;  Ext- neg w/o c/c/e;  Neuro- intact.. IMP/PLAN>>  OK Flu shot today;  Meds refilled- continue same and ROV follow up in 44mo...  ~  May 31, 2015:  32mo ROV & Amy Lane has had a lot going on neurologically in the interval>  She fell at home w/ fx bones in left foot- eval & cast by DrHewitt, just got the cast off to boot now; she had f/u DrWillis w/ mild cognitive impairment & he started Aricept5=>10 & pt feels much better, memory improved, not getting lost driving, etc;  Also says she was told "prob MS" (falls, weakness, bladder, etc) but they can't do MRI due to her pacer;  She has a great attitude, feeling well today, no prob w/ meds, due for CXR & FASTING blood work today... Given PREVNAR-13 today as well.    AR> on Benedryl OTC & Nasonex prn; breathing is good w/o cough, sput, SOB, etc; doing OK even w/ the pollen...    Hx Dysautonomia (POTS) & Ehlers-Danlos syndrome> followed by DrNishan/DrKlein & DrHawkes (seen 4/16 w/ hand pain/weakness, polyarthralgia, OA- we do not have more recent note); BP= 118/70 no postural change, on Eliquis5Bid.    MVP, Myxomatous valve- s/p MVrepair (in [place])> on Eliquis5Bid; she is followed by DrKlein- seen 4/16 & stable pacer & no changes made...    Hx Tachy-brady, Fib-flutter w/ ablation & pacer> on Sectral200/d, & Eliquis5Bid; she prev did her own Protimes at home & changed to Eliquis5Bid...    Cerebrovasc Dis> on Eliquis5Bid, s/p left CAE 2005 by [facility] for asymptomatic left carotid stenosis w/ ulceration, & f/u CDopplers all ok...    ?DVT & ?PE while on Coumadin (during trip to [place])> she then checked her own protimes like they do in [place]; now on eliquis5Bid & stable...    CHOL> on Zetia10 since 1/15 Hosp where LDL was 155; FLP on Zetia10 5/17 shows TChol 168, TG 105, HDL 60, LDL 87; she is intol to all other meds...    Hypothy> on Synthroid75; lab 5/17 shows TSH= 2.08; continue same  meds, take pill first thing in morn, etc...    Underweight> BMI in the 19-20 range & we discussed nutritional supplements etc=> wt up to 142#...     GI- Constip & Hems> on Align; c/o constip & now improved on Linzess per DrJacobs; last colon 2009 by DrJacobs w/ hems only & f/u planned 47yrs...    GYN- kraurosis vulvae (DIV)> local GYN, [facility] vulva clinic treatment & she is up to date on screening; Hx enterococcus UTI...    Migraine HAs & ?TIA, Mild cognitive impairment, ?MS> she is  followed by [facility]- DrWillis; Hosp 1/15 w/ another episode, symptoms cleared, ASA81 added to her Coumadin which was later changed to Eliquis5Bid; she had c/o some leg weakness but EMG/NCV was normal & rec to try PT;  She reports [facility] on Aricept & improved memory she says; DrWillis indicated that he thinks she has MS but they can't get MRI w/ her pacer...    Anxiety/ Depression> on Ativan 1mg  prn & Celexa20 which she really thinks is helping...  EXAM shows Afeb, VSS, O2sat=98% on RA;  Marfanoid habitus;  HEENT- neg, mallampati2;  Chest- clear w/o w/r/r;  Heart- RR w/o m/r/g;  Abd- soft, nontender;  Ext- neg w/o c/c/e;  Neuro- intact..  CXR 05/31/15>  Stable- norm heart size, s/p median sternotomy & MVR, pacer in situ, hyperinflated appearance suggests underlying COPD, scarring left apex, NAD...  LABS 05/31/15>  FLP- at goals on Zetia10;  Chems- wnl;  CBC- wnl;  TSH=2.08;   IMP/PLAN>>  Amy Lane looks good, she has gained a few lbs eating ice cream Qhs, yet her Lipids remain in good control, Labs normal, CXR clear; she is planning a [place] cruise this summer w/ a nurse friend; stable on current med rx- continue same, given PREVNAR-13 today...  ~  December 14, 2015:  39mo ROV & Amy Lane reports doing well- feels good and w/o complaints except a constant runny nose; a friend uses Atrovent nasal spray & she wonders if she can try this- Rx written for 1-2 sprays in each nostril Bid;  She is looking forward to another [place] vacation next  yr;  Her main source of exercise is walking... Prob List reviewed- given 2017 flu vaccine today...    She saw DrKlein 11/02/15>  Hx SBrady, s/p pacer w/ generator change and lead repair 03/2009; she has Ehlers-Danlos syndrome, marfanoid habitus, Hx MVP w/ MV repair in [place]; she has hx prior TIA, syncope from long QT syndrome; and signif dysautonomia c/w postural orthostatic tachycardia syndrome (POTS);  Pacer functioning well, no changes made to the programming; continue Eliquis...    She saw Cherly Hensen 09/20/15>  Note reviewed, no changes made in meds...     She saw DrWillis 06/15/15>  Note reviewed, Hx migraines, gait abn, reported gradual change in memory which improved significantly on Aricept5, Exam was neg, Prev Carotid dopplers 02/2015 & 06/2015 were OK showing heterogeneous plaque bilat w/ prev left CEA & DPA, 1-39% bilat ICSstenoses, norm subclav & norm vertebrals bilaterally...  IMP/PLAN>>  OK 2017 Flu shot, continue same meds and Rx written for Atrovent nasal spray at her request; we will continue Q26mo ROVs...  ~  Jun 12, 2016:  12mo ROV & Amy Lane has been doing well overall-- c/o recent allergy problems w/ head congestion, nasal drainage, watery eyes, sl sore throat & hoarseness she says; she has been using Atrovent nasal & OTC Benedryl w/ min relief;  She is traveling to [place] soon for a 3wk vacation & [place] river cruise- very excited & she says "the best I've felt since I was 41"... We reviewed the following medical problems during today's office visit >>     AR> on Atrovent nasal, Benedryl OTC & Nasonex prn; breathing is good w/o cough, sput, SOB, etc; now having a rough spring w/ the pollen => Rx w/ DYMISTA & PRED Dosepak...    Hx Dysautonomia (POTS) & Ehlers-Danlos syndrome> followed by DrNishan/DrKlein & DrHawkes (seen w/ hand pain/weakness, polyarthralgia, OA- she is doing PT and loves it!); BP= 100/60 no postural change.Marland KitchenMarland Kitchen  MVP, Myxomatous valve- s/p MVrepair (in [place])> on  Eliquis5Bid; she is followed by DrKlein & Nishan- stable pacer checks & no changes made...    Hx Tachy-brady, Fib-flutter w/ ablation & pacer> on Sectral200/d, & Eliquis5Bid; EKG 10/17 showed electronic atrial pacer, bifascicular block w/ RBBB & left post fascicular block...    Cerebrovasc Dis> on Eliquis5Bid, s/p left CAE 2005 by [facility] for asymptomatic left carotid stenosis w/ ulceration, & f/u CDopplers by [facility]=> last 6/17 w/ mild homogeneous plaque, no signif stenoses...    ?DVT & ?PE while on Coumadin (during trip to [place])> she then checked her own protimes like they do in [place] & now on Eliquis5Bid & stable...    CHOL> on Zetia10 (INTOL statins) since 1/15 Hosp where LDL was 155; FLP on Zetia10 5/18 shows TChol 159, TG 71, HDL 51, LDL 94; she is intol to all other meds...    Hypothy> on Synthroid75; lab 5/18 shows TSH= 9.50; REC- continue same med, take 1st thing in AM by itself, do not eat for 15min & recheck lab in 70mo => TSH=8.43 therefore incr to 110mcg/d...    Underweight> BMI in the 19-20 range & we discussed nutritional supplements etc=> wt is steady & she feels well...     GI- Constip & Hems> on Align; c/o constip & now improved on Linzess72/d per DrJacobs; last colon 2009 by DrJacobs w/ hems only & f/u planned 41yrs...    GYN- kraurosis vulvae (DIV)> local GYN, [facility] vulva clinic treatment & she will keep up to date on screening; Hx enterococcus UTI...    Migraine HAs & ?TIA, Mild cognitive impairment, ?MS> she is followed by [facility]- DrWillis; Hosp 1/15 w/ another episode, symptoms cleared, ASA81 added to her Coumadin which was later changed to Eliquis5Bid; she had c/o some leg weakness but EMG/NCV was normal & rec to try PT=> much improved;  She reports [facility]CI on Aricept & improved memory she says; DrWillis indicated that he thinks she has MS but they can't get MRI w/ her pacer...    Vit D deficiency>  Labs 5/18 showed VitD level = 24 & she is rec to start VitD 2000u  daily OTC supplement...    Anxiety/ Depression> on Ativan 1mg  prn (takes it Qhs) & Celexa20 which she really thinks is helping...  EXAM shows Afeb, VSS, O2sat=96% on RA;  Marfanoid habitus;  HEENT- neg, mallampati2;  Chest- clear w/o w/r/r;  Heart- RR w/o m/r/g;  Abd- soft, nontender;  Ext- neg w/o c/c/e;  Neuro- intact.Marland Kitchen  2DEcho 03/2016 showed mild conc LVH, norm LVF w/ EF=60-65%, no regional wall motion abn, Gr2DD, MV is mod thickened & myxomatous- s/p repair, triv MR...   CXR 06/12/16 (independently reviewed by me in the PACS system) showed hyperinflation/ body habitus, no focal parenchymal abn- NAD, norm heart size w/ prior median sternotomy & dual lead pacer in good position...   LABS 06/12/16>  FLP- all parameters at goals on Zetia10;  Chems- wnl w/ BS=96, Cr=0.82, LFTs wnl;  CBC- wnl w/ Hg=13.6;  TSH=9.50 & advised proper dosing & compliance w/ recheck;  VitD=24 & rec 2000u/d supplement;  HepC=neg...  IMP/PLAN>>  For her AR, allergy symptoms we will Rx w/ the addition of Dymista & Pred Dosepak;  Her TSH is elev & I suspect compliance vs interference in absorption- rec to take 1st thing in the AM by itself & do not eat or drink for 65min, we will recheck TSH in 31mo;  Routine check of Vit D level  is low at 24 & she is rec to start VitD 2000u/d OTC supplement... She will contact GYN for routine f/u visit due...   ~  July 25, 2016:  6wk ROV & add-on appt> Amy Lane returned from her fabulous [place] vacation & reports that mult members of her part got ill w/ URI symptoms; Amy Lane remained well until her return to [employer]- c/o sore throat, cough- small amt thick clear sput, no hemoptysis, low grade fever & chills, incr SOB- feels heavy in the chest/ tight/ some wheezing, denies CP/ palpit/ etc;  She's been taking OTC meds + the Dymista & Atrovent nasal... She is also due for a follow up TSH & confirms regular dosing/ 1st thing in the AM/ not w/ other meds/ & NPO for 45 min after the dose;  She is reminded to start  regular VitD supplement ~2000u OTC Vit D supplement daily...    EXAM shows Afeb, VSS, O2sat=97% on RA;  Marfanoid habitus;  HEENT- neg, mallampati2;  Chest- few bibasilar rhonchi, no w/r/consolidation;  Heart- RR w/ gr1/6 sys murmur, no r/g;  Abd- soft, nontender;  Ext- neg w/o c/c/e...  CXR 07/25/16 (independently reviewed by me in the PACS system) showed similar to prev films- clear- NAD & no signs of pneumonia...  LABS 07/25/16>  CBC- wnl;  Sed=26;  TSH= 8.43 on Synthroid75... IMP/PLAN>>  Amy Lane has an upper resp infection after her [place] vacation during which several members of her team got ill;  CXR is clear & CBC is OK;  We discussed Rx w/ LEVAQUIN 500mg  x7d & Depo80 today followed by Pred dosepak starting tomorrow; also rest, fluids, etc;  We will increase her SYNTHROID to 146mcg/d & recheck labs just after Labor Day in Sept;  She is reminded to get an OTC VitD supplement ~2000u/d regularly...   ADDENDUM>>  LABS 09/26/16 on Synthroid 148mcg/d showed TSH=0.05;  This is sl over-replaced & REC to decrease by 1/2 step to SYNTHROID 27mcg tabs- one tab Qam (call in #30 or #90) and plan f/u TSH again in 34mo... ADDENDUM>>  LABS 12/19/16 on Synthroid 68mcg/d showed TSH=0.30 => REC to continue same for now...          Problem List:      ALLERGIC RHINITIS (ICD-477.9) - we discussed Rx w/ Zyrtek, Astepro Prn, etc... ~  CXR 3/11 showed left pacer, sternal wires, NAD.Marland Kitchen. ~  CXR 6/13 showed stable post-op changes and pacer, pulm hyperinflation & scattered scarringt, calcif mediastinal nodes, no focal opacities, NAD.Marland Kitchen. ~  CXR 1/15 showed norm heart size, post op changes & pacer, no adenop, no edema, clear x some left apical pleural thickening w/o change...  MITRAL VALVE PROLAPSE (ICD-424.0) - followed by Cherly Hensen... ~  2DEcho 11/08 showed myxomatous MV, annuloplasty ring, decr post leaflet excursion, norm LVH & wall motion... ~  NuclearStressTest 11/08 showed mild apical thinning... prev study 12/04 w/o  ischemia or infarct & EF=68%... ~  she sees [place] every 6 months- for f/u MVP (s/p MV repair at [facility]), dysautonomia (s/p pacer), & Ehlers-Danlos Syndrome> last seen 1/15 & note reviewed, EKG w/ irreg rhythm, borderline QT interval... ~  2DEcho 1/15> cavity size was normal, wall thickness was normal w/ norm systolic function was normal & EF= 55% to 60%, myxomatous MV s/p repair with no residual MR, LA is mildly dilated.  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 hx AFib/Flutter w/ ablation & pacemaker placed ... she has Ehlers-Danlos synd w/ prolonged QT interval..Marland Kitchen  CARDIAC PACEMAKER IN SITU (ICD-V45.01) - f/u DrKlein w/ CTChest 4/09- neg. ~  dual chamber pacer changed 3/11 by DrKlein for end-of-life... ~  EKG shows NSR, 1st degree AVB, RBBB... ~  3/15: she had f/u DrKlein> note reviewed, pacer functioning well- no changes made but he wanted her to consider a NOAC...  DYSAUTONOMIA (ICD-742.8) - prev on Proamatine 5mg  tabs per DrKlein, but this was stopped...  CEREBROVASCULAR DISEASE (ICD-437.9) - on ASA 81mg /d & COUMADIN => 11/2014 she was switched to Eliquis5Bid... ~  she had a Left CAE 11/05 by [facility] for symptomatic left carotid stenosis w/ ulceration... ~  Carotid angiogram 5/08 by [facility] showed <30% right carotid stenosis ~  f/u CTA of Head & Neck 12/09 was neg= norm CTA head, & no signif carotid dis noted in neck. ~  CDoppler 2/11 showed stable mild carotid dis, left CAE w/ DPA is patent, 0-39% bilat ICA stenoses. ~  repear CDopplers 11/11 per Cherly Hensen showed smooth plaque in right bulb & distal left CCA- stable; 0-39% bilat ICA stenoses... ~  CT Head 6/13 is neg- No acute intracranial abnormalities... ~  CTA Brain 6/13 showed mild atherosclerotic dis in the carotid siphon regions bilat w/o stenoses, otherw norm CTA brain w/o lesions... ~  CT Angio Neck 6/13 via ER showed mild nonstenotic arteriosclerotic dis at both  carotid bifurcations w/o signif stenoses... ~  CDopplers 11/13 showed mild soft smooth plaque w/ 0-39% right ICAstenosis & 40-59% left ICA stenosis, vertebrals are antegrade... ~  CDopplers 1/15 showed 1-39% internal carotid artery stenosis bilaterally & antegrade vertebral flow... ~  CDopplers 2/16 showed mixed plaque bilat, stable 1-39% bilat ICAstenoses w/ left CEA & DPA, norm subclav & patent vertebrals...  ?DVT & poss PTE while on vacation in [place] during the summer of 2012 >> pt was on Coumadin & INR was therapeutic; she had pain & swelling in left leg w/ a pos doppler reported by a [place] physician; she was also SOB & they questioned poss PTE but didn't get scan or further eval;  On return to [place] DrNishan wondered if she might have bled into the leg muscles & sent her to DrNorris for Ortho eval; he could not corroborate the theory & treated her w/ compression stockings & she gradually improved towards her baseline...  HYPERCHOLESTEROLEMIA (ICD-272.0) - on diet alone now, prev on Lip20- this was stopped 1/12 by Cherly Hensen & she feels much better off this med! ~  [employer] [place] 05/13/07 on Lip20 showed TChol 119, TG 50, HDL 55, LDL 54 ~  FLP 5/10 on Lip20 showed TChol 120, TG 43, HDL 58, LDL 54 ~  FLP 9/11 on Lip20 showed TChol 130, TG 54, HDL 59, LDL 60 ~  FLP 5/12 on diet alone showed TChol 218, TG 91, HDL 57, LDL 135... Continue low chol, low fat diet. ~  FLP 10/12 showed TChol 231, TG 86, HDL 68, LDL 151... I rec the Lipid CLinic, & they started NIACIN 500mg /d. ~  FLP 2/13 on Niacin500 showed TChol 199, TG 55, HDL 76, LDL 112... Much improved, continue same. ~  FLP 11/13 on diet alone showed TChol 231, TG 49, HDL 67, LDL 151..,. rec refer to Lipid clinic. ~  [place] 4/14 on diet + Niacin showed TChol 233, TG 78, HDL 75, LDL 146  ~  FLP 1/15 in hosp showed Tchol 235, TG 79, HDL 64, LDL 155 => ZETIA 10mg /d started... ~  [place] 3/15 on Zetia10 showed TChol 172, TG 74, HDL 59, LDL 89... Continue  same... ~  [place]  3/16 on Zetia10 showed TChol 167, TG 66, HDL 61, LDL 93  HYPOTHYROIDISM (ICD-244.9) - currently on SYNTHROID 20mcg/d... ~  labs 4/09 on Levoth88 showed TSH = 0.10... rec to decr Synthroid to 15mcg/d... ~  labs 9/09 on Levoth75 showed TSH = 1.67 ~  labs 5/10 on Levoth75 showed TSH = 2.92... Continue same. ~  labs 9/11 on Levoth75 showed TSH= 1.37 ~  Labs 5/12 on Levothy75 showed TSH= 4.33 ~  Labs 10/12 on Levothy75 showed TSH= 1.51... Continue same. ~  Labs 11/13 on Levothy75 showed TSH= 1.55 ~  Labs 4/14 on Levothy75 showed TSH= 3.10 ~  Labs 3/15 on Levothy75 showed TSH= 4.99... Reminded to take every AM on empty stomach etc. ~  Labs 3/16 showed TSH= 3.24  HEMORRHOIDS (ICD-455.6) - last colonoscopy 2/09 by DrJacobs showed only sm hems... f/u 29yrs.  GYN - followed by DrMcPhail and Dx w/ kraurosis vulvae 7/10... Prev on Premarin VagCream. ~  11/10: she discussed the Dx w/ me & will inquire about poss hyst/ BSO... ~  4/11:  eval in the [facility] by Dr Illene Bolus showed vulvar dermatitis & dryness w/ rec for "Crisco" Tid! 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   FIBROMYALGIA (ICD-729.1) - she has been doing Yoga classes and this has helped... ~  She saw DrNorris 9/14 w/ neck pain> CT CSpine showed degen disc dis & he rec PT... ~  3/16: she is c/o persistent bilat hand pain & swelling, not much better w/ OTC NSAIDs; we decided to check Anti-CCP (neg) & Sed (15); we will Rx w/ Tramadol50Tid prn, hot soaks, & refer to Rheum for further eval...  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  Hx of TRANSIENT ISCHEMIC ATTACK (ICD-435.9) - on ASA 81mg /d & COUMADIN> she has been followed by DrWillis; s/p left carotid endarterectomy 11/05 by [facility]. ~  1/12: f/u eval by DrWillis reviewed... ~  6/13: she presented w/ ?TIA symptoms & eval  was neg; review by DrWillis indicated prob Migraine & everything resolved... ~  1/15: she had another episode, Hosp by [facility] x2d but felt to prob be Migraine related... ~  11/15: she had f/u appt w/ DrWillis> Hx Ehlers-Danlos, Migraines, & c/o some leg weakness (some difficulty rising from squat); he did EMG/NCV (neg-no myopathy, no neuropathy) & rec phys therapy  Hx of SYNCOPE (ICD-780.2) - felt to be neurally mediated, ? related to long QT, no recur since pacer placed...  MIGRAINE HEADACHE >> she presented w/ ?TIA symptoms 6/13 & eval was neg; review by DrWillis indicated prob Migraine & everything resolved, no recurrence... ~  Seen by Neuro 2/15 DrWillis w/ migraines, memory deficit, abn of gait;   DEPRESSION (ICD-311) - she takes LORAZEPAM 1mg Qhs for insomnia... she prev saw DrGraves for counselling in [place]... ~  11/10: poor appetite & some wt loss prob related to depression & we discussed trial Lexapro 10mg /d... ~  3/11: she is very pleased- feels better, gained 5# w/ Ensure, etc... ~  9/11:  "I feel great" & Lexapro continued... ~  She was able to stop the Lexapro 1/12 (also stopped the Lipitor) & has done well off it ever since... ~  10/13:  She called w/ situational depression due to marital stress, getting counseling, etc; started PROZAC 20mg /d & she reports much improved... ~  2015: she is off  the Prozac and using Ativan 1mg  prn... ~  9/15: on Ativan1mg  prn and CELEXA20 called in several mo ago & she is much improved...  CARCINOMA, BASAL CELL (ICD-173.9) - skin cancer removed from left leg by [facility] 2008. ~  12/11:  She reports bx of rash on legs= lichen planus per DrHall, treated w/ topical ointment... ~  2/13:  She had a skin lesion removed from her neck= basal cell ca & wide excision performed by DrLupton.  Health Maintenance: ~  GI:  followed by DrJacobs w/ colonoscopy 2/09 showing only sm hems... ~  GYN:  followed by DrMcPhail, and Dr. Illene Bolus at [facility]... ~  Labs 9/11 showed Vit D level = 39 & rec to start 1000 u OTC supplement... ~  Immunizations:  she had TDAP 3/11... she gets yearly Flu vaccine in the Fall of the yr...    Past Surgical History:  Procedure Laterality Date  . CAROTID ENDARTERECTOMY    . complex mitral valve repair     at the [facility]  . INSERT / REPLACE / REMOVE PACEMAKER    . left carotid endarterectomy  11/2003   by Dr. Delton See  . PACEMAKER INSERTION     medtronic kappa [id number]  . removed chalazion from left eye lid  09/2011   Dr. Satira Sark    Outpatient Encounter Prescriptions as of 07/25/2016  Medication Sig  . acebutolol (SECTRAL) 200 MG capsule TAKE ONE CAPSULE BY MOUTH AT BEDTIME  . apixaban (ELIQUIS) 5 MG TABS tablet Take 1 tablet (5 mg total) by mouth 2 (two) times daily.  . Azelastine-Fluticasone (DYMISTA) 137-50 MCG/ACT SUSP 1-2 sprays in each nostril BID as needed  . citalopram (CELEXA) 20 MG tablet TAKE 1 TABLET EVERY DAY  . donepezil (ARICEPT) 5 MG tablet Take 1 tablet (5 mg total) by mouth at bedtime.  Marland Kitchen ezetimibe (ZETIA) 10 MG tablet TAKE 1 TABLET EVERY DAY  . ipratropium (ATROVENT) 0.06 % nasal spray Place 2 sprays into both nostrils 2 (two) times daily.  Marland Kitchen levofloxacin (LEVAQUIN) 500 MG tablet Take 1 tablet (500 mg total) by mouth daily.  Marland Kitchen levothyroxine (SYNTHROID, LEVOTHROID) 75 MCG tablet Take 1 tablet (75 mcg total) by mouth daily.  Marland Kitchen levothyroxine (SYNTHROID, LEVOTHROID) 75 MCG tablet TAKE 1 TABLET BY MOUTH EVERY DAY  . linaclotide (LINZESS) 72 MCG capsule Take 1 capsule (72 mcg total) by mouth daily before breakfast. (Patient taking differently: Take 72 mcg by mouth daily before breakfast. Pt takes 1 tablet 3 times weekly)  . LORazepam (ATIVAN) 1 MG tablet TAKE 1 TABLET 3 TIMES A DAY AS NEEDED (Patient taking differently: TAKE 1 TABLET 3 TIMES A DAY AS NEEDED Pt reports taking this at night)  . predniSONE (STERAPRED UNI-PAK 21 TAB) 5 MG (21) TBPK tablet Take as directed  . Probiotic  Product (ALIGN) 4 MG CAPS Take 4 mg by mouth every morning.  . traMADol (ULTRAM) 50 MG tablet Take 1 tablet (50 mg total) by mouth 3 (three) times daily as needed for moderate pain.  . [DISCONTINUED] predniSONE (STERAPRED UNI-PAK 21 TAB) 5 MG (21) TBPK tablet Take as directed (Patient not taking: Reported on 06/30/2016)  . [EXPIRED] methylPREDNISolone acetate (DEPO-MEDROL) injection 80 mg    No facility-administered encounter medications on file as of 07/25/2016.     Allergies  Allergen Reactions  . Other Other (See Comments)    All QTc prolongation drugs  . Statins Other (See Comments)    myalgias    Immunization  History  Administered Date(s) Administered  . Influenza Split 11/21/2010, 11/21/2011  . Influenza Whole 02/14/2007, 10/16/2007, 10/13/2009  . Influenza,inj,Quad PF,36+ Mos 11/19/2012, 10/06/2013, 11/30/2014, 11/29/2015  . Pneumococcal Conjugate-13 05/31/2015  . Pneumococcal Polysaccharide-23 01/24/2003  . Td 04/16/2009    Current Medications, Allergies, Past Medical History, Past Surgical History, Family History, and Social History were reviewed in [employer] record.    Review of Systems         See HPI - all other systems neg except as noted... The patient complains of dyspnea on exertion.  The patient denies anorexia, fever, weight loss, weight gain, vision loss, decreased hearing, hoarseness, chest pain, syncope, peripheral edema, prolonged cough, headaches, hemoptysis, abdominal pain, melena, hematochezia, severe indigestion/heartburn, hematuria, incontinence, muscle weakness, suspicious skin lesions, transient blindness, difficulty walking, depression, unusual weight change, abnormal bleeding, enlarged lymph nodes, and angioedema.     Objective:   Physical Exam     WD, Thin, 66 y/o WF in NAD... she is 5'11"Tall and 141# = BMI~20... Vital Signs:  Reviewed... GENERAL:  Alert & oriented; pleasant & cooperative... HEENT:  [place]/AT, EOM-wnl, PERRLA,  EACs-clear, TMs-wnl, NOSE-clear, THROAT-clear & wnl. NECK:  Supple w/ fair ROM; no JVD; normal carotid impulses w/o bruits, left CAE scar; no thyromegaly or nodules palpated; no lymphadenopathy. CHEST:  Few bibasilar rhonchi, no wheezing/ rales/ or signs of consolidation... HEART:  Regular Rhythm; pacer on left, without murmurs/ rubs/ or gallops detected... ABDOMEN:  Soft & nontender; normal bowel sounds; no organomegaly or masses palpated... EXT: without deformities or arthritic changes; no varicose veins/ venous insuffic/ or edema. NEURO:  CN's intact;  no focal neuro deficits... DERM:  s/p skin cancer removed from left leg; min rash noted...  RADIOLOGY DATA:  Reviewed in the EPIC EMR & discussed w/ the patient...  LABORATORY DATA:  Reviewed in the EPIC EMR & discussed w/ the patient...   Assessment & Plan:    MVP/ MV Repair> Tachy-Brady/ PACER> Dysautonomia>  Followed by Cherly Hensen & DrKlein, doing well on Eliquis & Sectral, continue same...  Cerebrovasc Dis>  Off ASA now that she's on EliquisBid, s/p left CAE 11/05, no recurrent cerebral ischemic symptoms & CDopplers stable...  ?DVT while in [place]> moot issue at present; prev on Coumadin now Eliquis; leg swelling diminished on the compression hose, no salt, elevation, etc; she is back to her exercise program & back to baseline...  CHOL>  She feels much better off the Lipitor & FLP improved w/ Zetia10 started 1/15 [facility]- continue same...  Hypothyroid>  Stable on the Synthroid 28mcg/d...  GYN>  Stable & followed both here & at [facility] (see above)...  EHLERS-DANLOS>  Aware, stable, she has been feeling better & in great spirits...  Migraines, ?TIA, ?MS, mild cognitive impairment> [facility] 6/13 & 1/15 w/ ?TIA but DrWillis thought likely Migraine syndrome- resolved, neg work up, no recurrence & they are following... Neuro> she mentions several symtoms that she is concerned about- clumsy, balance off, subjective leg weakness- & she is  concerned; therefore refer to Neurology for eval... 5/17> she indicates that DrWillis Dx mild cognitive impairment & treated w/ Aricept10 & she feels much better she says  Depression>  Off Prozac20 & on Celexa20 & Ativan prn; she & husb received counseling...  Other medical problems as noted... Routine f/u & CPX 06/12/16>   For her AR, allergy symptoms we will Rx w/ the addition of Dymista & Pred Dosepak;  Her TSH is elev & I suspect compliance vs interference in absorption- rec  to take 1st thing in the AM by itself & do not eat or drink for 67min, we will recheck TSH in 19mo;  Routine check of Vit D level is low at 24 & she is rec to start VitD 2000u/d OTC supplement... 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   Patient's Medications  New Prescriptions   LEVOFLOXACIN (LEVAQUIN) 500 MG TABLET    Take 1 tablet (500 mg total) by mouth daily.  Previous Medications   ACEBUTOLOL (SECTRAL) 200 MG CAPSULE    TAKE ONE CAPSULE BY MOUTH AT BEDTIME   APIXABAN (ELIQUIS) 5 MG TABS TABLET    Take 1 tablet (5 mg total) by mouth 2 (two) times daily.   AZELASTINE-FLUTICASONE (DYMISTA) 137-50 MCG/ACT SUSP    1-2 sprays in each nostril BID as needed   CITALOPRAM (CELEXA) 20 MG TABLET    TAKE 1 TABLET EVERY DAY   DONEPEZIL (ARICEPT) 5 MG TABLET    Take 1 tablet (5 mg total) by mouth at bedtime.   EZETIMIBE (ZETIA) 10 MG TABLET    TAKE 1 TABLET EVERY DAY   IPRATROPIUM (ATROVENT) 0.06 % NASAL SPRAY    Place 2 sprays into both nostrils 2 (two) times daily.   LEVOTHYROXINE (SYNTHROID, LEVOTHROID)  75 MCG TABLET    Take 1 tablet (75 mcg total) by mouth daily.   LEVOTHYROXINE (SYNTHROID, LEVOTHROID) 75 MCG TABLET    TAKE 1 TABLET BY MOUTH EVERY DAY   LINACLOTIDE (LINZESS) 72 MCG CAPSULE    Take 1 capsule (72 mcg total) by mouth daily before breakfast.   LORAZEPAM (ATIVAN) 1 MG TABLET    TAKE 1 TABLET 3 TIMES A DAY AS NEEDED   PROBIOTIC PRODUCT (ALIGN) 4 MG CAPS    Take 4 mg by mouth every morning.   TRAMADOL (ULTRAM) 50 MG TABLET    Take 1 tablet (50 mg total) by mouth 3 (three) times daily as needed for moderate pain.  Modified Medications   Modified Medication Previous Medication   PREDNISONE (STERAPRED UNI-PAK 21 TAB) 5 MG (21) TBPK TABLET predniSONE (STERAPRED UNI-PAK 21 TAB) 5 MG (21) TBPK tablet      Take as directed    Take as directed  Discontinued Medications   No medications on file

## 2016-07-27 ENCOUNTER — Telehealth: Payer: Self-pay | Admitting: Pulmonary Disease

## 2016-07-27 DIAGNOSIS — E039 Hypothyroidism, unspecified: Secondary | ICD-10-CM

## 2016-07-27 MED ORDER — LEVOTHYROXINE SODIUM 100 MCG PO TABS: 100.0000 ug | ORAL_TABLET | Freq: Every day | ORAL | 3 refills | Status: DC

## 2016-07-27 NOTE — Telephone Encounter (Signed)
Called and spoke with pt and she is aware of cxr and lab results per SN.  Pt is aware of new rx that has been sent to the pharmacy and she is aware to return to the lab around Labor day to have repeat labs done.  Nothing further is needed.

## 2016-07-28 ENCOUNTER — Encounter (HOSPITAL_COMMUNITY): Payer: 59

## 2016-07-28 NOTE — Addendum Note (Signed)
Encounter addended by: Jewel Baize, RD on: 07/28/2016 12:16 PM<BR>    Actions taken: Flowsheet data copied forward, Visit Navigator Flowsheet section accepted

## 2016-07-31 ENCOUNTER — Encounter (HOSPITAL_COMMUNITY): Payer: 59

## 2016-08-02 ENCOUNTER — Telehealth: Payer: Self-pay | Admitting: Cardiology

## 2016-08-02 ENCOUNTER — Ambulatory Visit: Payer: 59 | Admitting: *Deleted

## 2016-08-02 ENCOUNTER — Encounter (HOSPITAL_COMMUNITY): Payer: 59

## 2016-08-02 NOTE — Telephone Encounter (Signed)
Spoke with pt and reminded pt of remote transmission that is due today. Ordered patient a new monitor due to not having the Wirex adapter and her landline phone not working any longer. Pt will send remote transmission when she receives her home monitor. Pt verbalized understanding.

## 2016-08-04 ENCOUNTER — Encounter (HOSPITAL_COMMUNITY): Payer: 59

## 2016-08-07 ENCOUNTER — Encounter (HOSPITAL_COMMUNITY): Payer: 59

## 2016-08-09 ENCOUNTER — Encounter (HOSPITAL_COMMUNITY): Payer: 59

## 2016-08-11 ENCOUNTER — Encounter (HOSPITAL_COMMUNITY): Payer: 59

## 2016-08-14 ENCOUNTER — Encounter (HOSPITAL_COMMUNITY): Payer: 59

## 2016-08-16 ENCOUNTER — Encounter (HOSPITAL_COMMUNITY): Payer: 59

## 2016-08-18 ENCOUNTER — Encounter (HOSPITAL_COMMUNITY): Payer: 59

## 2016-08-19 ENCOUNTER — Other Ambulatory Visit: Payer: Self-pay | Admitting: Pulmonary Disease

## 2016-08-19 ENCOUNTER — Other Ambulatory Visit: Payer: Self-pay | Admitting: Cardiovascular Disease

## 2016-08-21 ENCOUNTER — Encounter (HOSPITAL_COMMUNITY): Payer: 59

## 2016-08-21 ENCOUNTER — Other Ambulatory Visit: Payer: Self-pay | Admitting: Cardiovascular Disease

## 2016-08-21 ENCOUNTER — Other Ambulatory Visit: Payer: Self-pay | Admitting: Pulmonary Disease

## 2016-08-23 ENCOUNTER — Encounter (HOSPITAL_COMMUNITY): Payer: 59

## 2016-08-25 ENCOUNTER — Encounter (HOSPITAL_COMMUNITY): Payer: 59

## 2016-09-05 LAB — CUP PACEART REMOTE DEVICE CHECK
Battery Impedance: 451 Ohm
Battery Remaining Longevity: 93 mo
Battery Voltage: 2.79 V
Brady Statistic AP VP Percent: 0 %
Brady Statistic AP VS Percent: 83 %
Brady Statistic AS VP Percent: 0 %
Brady Statistic AS VS Percent: 17 %
Implantable Lead Implant Date: 19981106
Implantable Lead Location: 753860
Implantable Lead Model: 5076
Lead Channel Impedance Value: 655 Ohm
Lead Channel Pacing Threshold Pulse Width: 0.4 ms
Lead Channel Pacing Threshold Pulse Width: 0.4 ms
Lead Channel Setting Pacing Amplitude: 2.5 V
Lead Channel Setting Pacing Pulse Width: 0.4 ms
MDC IDC LEAD IMPLANT DT: 20031031
MDC IDC LEAD LOCATION: 753859
MDC IDC MSMT LEADCHNL RA IMPEDANCE VALUE: 419 Ohm
MDC IDC MSMT LEADCHNL RA PACING THRESHOLD AMPLITUDE: 0.625 V
MDC IDC MSMT LEADCHNL RV PACING THRESHOLD AMPLITUDE: 1 V
MDC IDC PG IMPLANT DT: 20110310
MDC IDC SESS DTM: 20180715015204
MDC IDC SET LEADCHNL RA PACING AMPLITUDE: 2 V
MDC IDC SET LEADCHNL RV SENSING SENSITIVITY: 2.8 mV

## 2016-09-26 ENCOUNTER — Other Ambulatory Visit (INDEPENDENT_AMBULATORY_CARE_PROVIDER_SITE_OTHER): Payer: 59

## 2016-09-26 DIAGNOSIS — E039 Hypothyroidism, unspecified: Secondary | ICD-10-CM

## 2016-09-26 LAB — TSH: TSH: 0.05 u[IU]/mL — ABNORMAL LOW (ref 0.35–4.50)

## 2016-09-29 ENCOUNTER — Telehealth: Payer: Self-pay | Admitting: Pulmonary Disease

## 2016-09-29 DIAGNOSIS — E039 Hypothyroidism, unspecified: Secondary | ICD-10-CM

## 2016-09-29 MED ORDER — LEVOTHYROXINE SODIUM 88 MCG PO TABS: 88.0000 ug | ORAL_TABLET | Freq: Every day | ORAL | 2 refills | Status: DC

## 2016-09-29 NOTE — Telephone Encounter (Signed)
Called and spoke with pt and she is aware of lab results per SN.  She is aware of new rx for the synthroid 88 once daily has been sent in and she will start these tomorrow.    She is aware that she will need to come back in 2 months for fasting TSH and this order has been placed.  Nothing further is needed.

## 2016-10-03 ENCOUNTER — Ambulatory Visit (INDEPENDENT_AMBULATORY_CARE_PROVIDER_SITE_OTHER): Payer: 59

## 2016-10-03 DIAGNOSIS — Z23 Encounter for immunization: Secondary | ICD-10-CM | POA: Diagnosis not present

## 2016-11-06 ENCOUNTER — Ambulatory Visit (INDEPENDENT_AMBULATORY_CARE_PROVIDER_SITE_OTHER): Payer: 59 | Admitting: *Deleted

## 2016-11-06 ENCOUNTER — Telehealth: Payer: Self-pay | Admitting: Cardiology

## 2016-11-06 DIAGNOSIS — I495 Sick sinus syndrome: Secondary | ICD-10-CM

## 2016-11-06 NOTE — Telephone Encounter (Signed)
Spoke with pt and reminded pt of remote transmission that is due today. Pt verbalized understanding.   

## 2016-11-07 NOTE — Progress Notes (Signed)
Remote pacemaker transmission.   

## 2016-11-08 LAB — CUP PACEART REMOTE DEVICE CHECK
Battery Impedance: 475 Ohm
Battery Voltage: 2.79 V
Brady Statistic AP VP Percent: 0 %
Implantable Lead Implant Date: 19981106
Implantable Lead Implant Date: 20031031
Implantable Lead Location: 753859
Implantable Lead Location: 753860
Implantable Lead Model: 5076
Lead Channel Impedance Value: 402 Ohm
Lead Channel Pacing Threshold Pulse Width: 0.4 ms
Lead Channel Pacing Threshold Pulse Width: 0.4 ms
Lead Channel Setting Pacing Amplitude: 2.5 V
Lead Channel Setting Pacing Pulse Width: 0.4 ms
Lead Channel Setting Sensing Sensitivity: 2.8 mV
MDC IDC MSMT BATTERY REMAINING LONGEVITY: 91 mo
MDC IDC MSMT LEADCHNL RA PACING THRESHOLD AMPLITUDE: 0.625 V
MDC IDC MSMT LEADCHNL RV IMPEDANCE VALUE: 636 Ohm
MDC IDC MSMT LEADCHNL RV PACING THRESHOLD AMPLITUDE: 1.125 V
MDC IDC PG IMPLANT DT: 20110310
MDC IDC SESS DTM: 20181016181524
MDC IDC SET LEADCHNL RA PACING AMPLITUDE: 2 V
MDC IDC STAT BRADY AP VS PERCENT: 81 %
MDC IDC STAT BRADY AS VP PERCENT: 0 %
MDC IDC STAT BRADY AS VS PERCENT: 19 %

## 2016-11-10 ENCOUNTER — Encounter: Payer: Self-pay | Admitting: Cardiology

## 2016-12-06 NOTE — Progress Notes (Signed)
Patient ID: Amy Lane, female   DOB: 12-10-1950, 66 y.o.   MRN: [id number] 66 y.o. female history of bradycardia post pacer  She has been seeing Dr Amy Lane  status post generator replacement and lead repair March 2011. Remote transmissions have shown normal pacer function   She also has a history of Ehlers-Danlos syndrome in the context of a marfanoid habitus, mitral valve prolapse status post mitral valve repair at the [facility] clinic with prior TIA on chronic anticoagulation  as well as syncope due to diagnosis of long QT syndrome. SHe has had significant dysautonomic symptoms consistent with POTS S/P left CEA with duplex 03/18/15 reviewed and 1-39% plaque no stenosis   Husband has had recent battle with prostate cancer  She is active Denies palpitations , syncope recurrent TIA or chest pain   Cognitive defect better on aricept  ? Of MS with LE weakness Unable to do MRI due to pacer Seen by Dr Amy Lane Recent left visual field cut but  Carotids normal.  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  All other systems reviewed and negative  General: BP 108/72   Pulse 70   Ht 5\' 11"  (1.803 m)   Wt 132 lb 8 oz (60.1 kg)   SpO2 98%   BMI 18.48 kg/m  Affect appropriate Healthy:  appears stated age 11: normal Neck supple with no adenopathy JVP normal left CEA with residual bruits no thyromegaly Lungs clear with no wheezing and good diaphragmatic motion Heart:  S1/S2 no murmur, no rub, gallop or click PMI normal post sternotomy pacer under left clavicle   Abdomen: benighn, BS positve, no tenderness, no AAA no bruit.  No HSM or HJR Distal pulses intact with no bruits No edema Neuro non-focal Skin warm and dry No muscular weakness    Current Outpatient Medications  Medication Sig Dispense Refill  . acebutolol (SECTRAL) 200 MG capsule TAKE ONE CAPSULE BY MOUTH AT BEDTIME 90 capsule 2  . Azelastine-Fluticasone (DYMISTA) 137-50 MCG/ACT SUSP 1-2 sprays in each nostril BID as needed 23 g 3  . citalopram (CELEXA) 20 MG tablet TAKE 1 TABLET EVERY DAY 90 tablet 3  . donepezil (ARICEPT) 5 MG tablet Take 1 tablet (5 mg total) by mouth at bedtime. 90 tablet 3  . ELIQUIS 5 MG TABS tablet TAKE 1 TABLET TWICE A DAY 180 tablet 3  . ezetimibe (ZETIA) 10 MG tablet TAKE 1 TABLET BY MOUTH EVERY DAY 90 tablet 1  . ipratropium (ATROVENT) 0.06 % nasal spray Place 2 sprays into both nostrils 2 (two) times daily. 15 mL 12  . levofloxacin (LEVAQUIN) 500 MG tablet Take 1 tablet (500 mg total) by mouth daily. 7 tablet 0  . levothyroxine (SYNTHROID, LEVOTHROID) 88 MCG tablet Take 1 tablet (88 mcg total) by mouth daily before breakfast. 30 tablet 2  . linaclotide (LINZESS) 72 MCG capsule Take 1 capsule (72 mcg total) by mouth daily before breakfast. (Patient taking differently: Take 72 mcg by mouth daily before breakfast. Pt takes 1 tablet 3 times weekly) 30 capsule 11  . LORazepam (ATIVAN) 1 MG tablet TAKE 1  TABLET BY MOUTH 3 TIMES A DAY AS NEEDED 90 tablet 5  . Probiotic Product (ALIGN) 4 MG CAPS Take 4 mg by mouth every morning.    . traMADol (ULTRAM) 50 MG tablet Take 1 tablet (50 mg total) by mouth 3 (three) times daily as needed for moderate pain. 50 tablet 0   No current facility-administered medications for this visit.     Allergies  Other and Statins  Electrocardiogram:  Atrial pacing rate 77 somewhat long delay from P wave and atrial spike  ICRBBB normal ventricular complex 03/19/15  A pacing rate 73  RBBB 12/11/16  A paced RBBB   Assessment and  Plan  Pacer:  Reviewed Dr Aquilla Hacker last note normal function normal remote transmission    POTS:  Improved with no postural symptoms    TIA:  CEA patent by duplex  Now on Eliquis with no bleeding issues ? Related to PAF  MVrepair:  No murmur on exam echo 04/11/16 repair looks great with normal EF   Thyroid:  Synthroid dose decreased recently f/u labs next week  Lab Results  Component Value Date   TSH 0.05 (L) 09/26/2016   Chol:   Lab Results  Component Value Date   LDLCALC 94 06/12/2016   Neuro:  Continue aricept for cognitive dysfunction F//U DR Amy Lane   Ortho:  Patellar degeneration post cortisone injection f/u Amy Lane would not advize surgery unless Pain gets worse   Amy Lane

## 2016-12-11 ENCOUNTER — Encounter: Payer: Self-pay | Admitting: Cardiovascular Disease

## 2016-12-11 ENCOUNTER — Ambulatory Visit (INDEPENDENT_AMBULATORY_CARE_PROVIDER_SITE_OTHER): Payer: 59 | Admitting: Cardiovascular Disease

## 2016-12-11 VITALS — BP 108/72 | HR 70 | Ht 71.0 in | Wt 132.5 lb

## 2016-12-11 DIAGNOSIS — I480 Paroxysmal atrial fibrillation: Secondary | ICD-10-CM | POA: Diagnosis not present

## 2016-12-11 DIAGNOSIS — I059 Rheumatic mitral valve disease, unspecified: Secondary | ICD-10-CM

## 2016-12-11 NOTE — Patient Instructions (Signed)

## 2016-12-12 ENCOUNTER — Ambulatory Visit: Payer: 59 | Admitting: Pulmonary Disease

## 2016-12-12 DIAGNOSIS — Z7901 Long term (current) use of anticoagulants: Secondary | ICD-10-CM | POA: Insufficient documentation

## 2016-12-12 DIAGNOSIS — I341 Nonrheumatic mitral (valve) prolapse: Secondary | ICD-10-CM | POA: Insufficient documentation

## 2016-12-12 DIAGNOSIS — I471 Supraventricular tachycardia, unspecified: Secondary | ICD-10-CM | POA: Insufficient documentation

## 2016-12-12 DIAGNOSIS — I4719 Other supraventricular tachycardia: Secondary | ICD-10-CM | POA: Insufficient documentation

## 2016-12-12 DIAGNOSIS — R234 Changes in skin texture: Secondary | ICD-10-CM | POA: Insufficient documentation

## 2016-12-12 DIAGNOSIS — C4491 Basal cell carcinoma of skin, unspecified: Secondary | ICD-10-CM | POA: Insufficient documentation

## 2016-12-12 DIAGNOSIS — Z8679 Personal history of other diseases of the circulatory system: Secondary | ICD-10-CM | POA: Insufficient documentation

## 2016-12-12 DIAGNOSIS — Z87898 Personal history of other specified conditions: Secondary | ICD-10-CM | POA: Insufficient documentation

## 2016-12-12 DIAGNOSIS — M797 Fibromyalgia: Secondary | ICD-10-CM | POA: Insufficient documentation

## 2016-12-12 DIAGNOSIS — E7800 Pure hypercholesterolemia, unspecified: Secondary | ICD-10-CM | POA: Insufficient documentation

## 2016-12-12 DIAGNOSIS — Z8673 Personal history of transient ischemic attack (TIA), and cerebral infarction without residual deficits: Secondary | ICD-10-CM | POA: Insufficient documentation

## 2016-12-12 DIAGNOSIS — Z950 Presence of cardiac pacemaker: Secondary | ICD-10-CM | POA: Insufficient documentation

## 2016-12-12 DIAGNOSIS — R269 Unspecified abnormalities of gait and mobility: Secondary | ICD-10-CM | POA: Insufficient documentation

## 2016-12-17 ENCOUNTER — Other Ambulatory Visit: Payer: Self-pay | Admitting: Pulmonary Disease

## 2016-12-17 DIAGNOSIS — E039 Hypothyroidism, unspecified: Secondary | ICD-10-CM

## 2016-12-19 ENCOUNTER — Other Ambulatory Visit (INDEPENDENT_AMBULATORY_CARE_PROVIDER_SITE_OTHER): Payer: 59

## 2016-12-19 DIAGNOSIS — E039 Hypothyroidism, unspecified: Secondary | ICD-10-CM | POA: Diagnosis not present

## 2016-12-19 LAB — TSH: TSH: 0.3 u[IU]/mL — AB (ref 0.35–4.50)

## 2016-12-21 ENCOUNTER — Ambulatory Visit: Payer: 59 | Admitting: Pulmonary Disease

## 2016-12-25 ENCOUNTER — Ambulatory Visit (INDEPENDENT_AMBULATORY_CARE_PROVIDER_SITE_OTHER): Payer: 59 | Admitting: Internal Medicine

## 2016-12-25 ENCOUNTER — Encounter: Payer: Self-pay | Admitting: Internal Medicine

## 2016-12-25 VITALS — BP 121/69 | HR 51 | Ht 71.0 in | Wt 133.4 lb

## 2016-12-25 DIAGNOSIS — I495 Sick sinus syndrome: Secondary | ICD-10-CM

## 2016-12-25 DIAGNOSIS — Z950 Presence of cardiac pacemaker: Secondary | ICD-10-CM

## 2016-12-25 NOTE — Patient Instructions (Addendum)
Medication Instructions:  Your physician recommends that you continue on your current medications as directed. Please refer to the Current Medication list given to you today.  *If you need a refill on your cardiac medications before your next appointment, please call your pharmacy*  Labwork: None ordered  Testing/Procedures: None ordered  Follow-Up: Remote monitoring is used to monitor your Pacemaker or ICD from home. This monitoring reduces the number of office visits required to check your device to one time per year. It allows [place] to keep an eye on the functioning of your device to ensure it is working properly. You are scheduled for a device check from home on 02/05/2017. You may send your transmission at any time that day. If you have a wireless device, the transmission will be sent automatically. After your physician reviews your transmission, you will receive a postcard with your next transmission date.  Your physician wants you to follow-up in: 1 year with Chanetta Marshall, NP.  You will receive a reminder letter in the mail two months in advance. If you don't receive a letter, please call our office to schedule the follow-up appointment.  Thank you for choosing [facility]!!    Any Other Special Instructions Will Be Listed Below (If Applicable).

## 2016-12-25 NOTE — Progress Notes (Signed)
Patient Care Team: Noralee Space, MD as PCP - General (Pulmonary Disease) Josue Hector, MD as PCP - Cardiology (Cardiology) Deboraha Sprang, MD as Consulting Physician (Cardiology)   HPI  Amy Lane is a 66 y.o. female is seen in followup for sinus bradycardia status post pacemaker, status post generator replacement and lead repair March 2011.  She also has a history of Ehlers-Danlos syndrome in the context of a marfanoid habitus, mitral valve prolapse status post mitral valve repair at the [facility] clinic with prior TIA on chronic Coumadin as well as syncope due to diagnosis of long QT syndrome. She has had significant dysautonomic symptoms consistent with POTS   The patient denies SOB, chest pain, edema or palpitations  She is doing as well as she has done in years   Her husband has developed cancer and has had a tongue of complications and side effects.  Date Cr Hgb  5/18 0.72 13.7 (7/18)        TSH is suppressed but she is being followed closely.   Past Medical History:  Diagnosis Date  . Abnormal gait   . Allergic rhinitis   . Atrial fibrillation ([place])   . Carcinomas, basal cell   . Cardiac pacemaker    DDD  MDT  . Cerebrovascular disease   . Depression   . Dysautonomia ([place])   . Ehler's-Danlos syndrome   . Fibromyalgia   . Headache(784.0)    Migraine  . Hemorrhoids   . Herpes zoster   . History of syncope   . History of tachycardia-bradycardia syndrome   . History of transient ischemic attack   . Hypercholesteremia   . Hypercholesterolemia   . Hypothyroidism   . Long term (current) use of anticoagulants   . Mitral valve prolapse   . PAT (paroxysmal atrial tachycardia) ([place])   . PSVT (paroxysmal supraventricular tachycardia) ([place])   . Skin desquamation    inflammative vaginitis  . SVT (supraventricular tachycardia) ([facility])     Past Surgical History:  Procedure Laterality Date  . CAROTID ENDARTERECTOMY    . complex mitral valve  repair     at the [facility]  . INSERT / REPLACE / REMOVE PACEMAKER    . left carotid endarterectomy  11/2003   by Dr. Delton See  . PACEMAKER INSERTION     medtronic kappa 901  . removed chalazion from left eye lid  09/2011   Dr. Satira Sark    Current Outpatient Medications  Medication Sig Dispense Refill  . acebutolol (SECTRAL) 200 MG capsule TAKE ONE CAPSULE BY MOUTH AT BEDTIME 90 capsule 2  . Azelastine-Fluticasone (DYMISTA) 137-50 MCG/ACT SUSP 1-2 sprays in each nostril BID as needed 23 g 3  . citalopram (CELEXA) 20 MG tablet TAKE 1 TABLET EVERY DAY 90 tablet 3  . donepezil (ARICEPT) 5 MG tablet Take 1 tablet (5 mg total) by mouth at bedtime. 90 tablet 3  . ELIQUIS 5 MG TABS tablet TAKE 1 TABLET TWICE A DAY 180 tablet 3  . ezetimibe (ZETIA) 10 MG tablet TAKE 1 TABLET BY MOUTH EVERY DAY 90 tablet 1  . ipratropium (ATROVENT) 0.06 % nasal spray PLACE 2 SPRAYS INTO BOTH NOSTRILS 2 (TWO) TIMES DAILY. 15 mL 2  . levothyroxine (SYNTHROID, LEVOTHROID) 88 MCG tablet TAKE 1 TABLET (88 MCG TOTAL) BY MOUTH DAILY BEFORE BREAKFAST. 30 tablet 2  . linaclotide (LINZESS) 72 MCG capsule Take 1 capsule (72 mcg total) by mouth daily before breakfast. (Patient taking differently:  Take 72 mcg by mouth daily before breakfast. Pt takes 1 tablet 3 times weekly) 30 capsule 11  . LORazepam (ATIVAN) 1 MG tablet TAKE 1 TABLET BY MOUTH 3 TIMES A DAY AS NEEDED 90 tablet 5  . Probiotic Product (ALIGN) 4 MG CAPS Take 4 mg by mouth every morning.    . traMADol (ULTRAM) 50 MG tablet Take 1 tablet (50 mg total) by mouth 3 (three) times daily as needed for moderate pain. 50 tablet 0   No current facility-administered medications for this visit.     Allergies  Allergen Reactions  . Other Other (See Comments)    All QTc prolongation drugs  . Statins Other (See Comments)    myalgias    Review of Systems negative except from HPI and PMH  Physical Exam BP 121/69   Pulse (!) 51   Ht 5\' 11"  (1.803 m)   Wt 133 lb  6.4 oz (60.5 kg)   BMI 18.61 kg/m  Well developed and nourished in no acute distress HENT normal Neck supple with JVP-flat Clear Regular rate and rhythm, no murmurs or gallops Abd-soft with active BS No Clubbing cyanosis edema Skin-warm and dry A & Oriented  Grossly normal sensory and motor function      Assessment and  Plan  Recurrent TIA  Mitral valve repair  Possible long QT  POTS/dysautonomia  Marfanoid habitus  Atrial fibrillation-paroxysmal  Pacemaker The patient's device was interrogated.  The information was reviewed. No changes were made in the programming.     On Anticoagulation;  No bleeding issues   No intercurrent atrial fibrillation or flutter  No syncopal events  Dysautonomia well compensated

## 2016-12-27 ENCOUNTER — Ambulatory Visit (INDEPENDENT_AMBULATORY_CARE_PROVIDER_SITE_OTHER): Payer: 59 | Admitting: Pulmonary Disease

## 2016-12-27 ENCOUNTER — Encounter: Payer: Self-pay | Admitting: Pulmonary Disease

## 2016-12-27 VITALS — BP 100/68 | HR 59 | Temp 98.0°F | Ht 71.0 in | Wt 135.6 lb

## 2016-12-27 DIAGNOSIS — I481 Persistent atrial fibrillation: Secondary | ICD-10-CM | POA: Diagnosis not present

## 2016-12-27 DIAGNOSIS — E7800 Pure hypercholesterolemia, unspecified: Secondary | ICD-10-CM

## 2016-12-27 DIAGNOSIS — Q796 Ehlers-Danlos syndrome, unspecified: Secondary | ICD-10-CM

## 2016-12-27 DIAGNOSIS — Z86718 Personal history of other venous thrombosis and embolism: Secondary | ICD-10-CM | POA: Diagnosis not present

## 2016-12-27 DIAGNOSIS — I679 Cerebrovascular disease, unspecified: Secondary | ICD-10-CM | POA: Diagnosis not present

## 2016-12-27 DIAGNOSIS — E039 Hypothyroidism, unspecified: Secondary | ICD-10-CM

## 2016-12-27 DIAGNOSIS — F341 Dysthymic disorder: Secondary | ICD-10-CM

## 2016-12-27 DIAGNOSIS — I059 Rheumatic mitral valve disease, unspecified: Secondary | ICD-10-CM | POA: Diagnosis not present

## 2016-12-27 DIAGNOSIS — R413 Other amnesia: Secondary | ICD-10-CM

## 2016-12-27 DIAGNOSIS — Z8679 Personal history of other diseases of the circulatory system: Secondary | ICD-10-CM

## 2016-12-27 DIAGNOSIS — K5909 Other constipation: Secondary | ICD-10-CM | POA: Diagnosis not present

## 2016-12-27 DIAGNOSIS — G901 Familial dysautonomia [Riley-Day]: Secondary | ICD-10-CM | POA: Diagnosis not present

## 2016-12-27 DIAGNOSIS — E559 Vitamin D deficiency, unspecified: Secondary | ICD-10-CM

## 2016-12-27 DIAGNOSIS — Z950 Presence of cardiac pacemaker: Secondary | ICD-10-CM | POA: Diagnosis not present

## 2016-12-27 DIAGNOSIS — Z9889 Other specified postprocedural states: Secondary | ICD-10-CM | POA: Diagnosis not present

## 2016-12-27 DIAGNOSIS — I4819 Other persistent atrial fibrillation: Secondary | ICD-10-CM

## 2016-12-27 NOTE — Patient Instructions (Signed)
Today we updated your med list in our EPIC system...    Continue your current medications the same...  Call for any questions or if we can be of service in any way...  Let's plan a follow up visit in 6mo, sooner if needed for problems...   

## 2016-12-28 ENCOUNTER — Encounter: Payer: Self-pay | Admitting: Pulmonary Disease

## 2016-12-28 DIAGNOSIS — Z9889 Other specified postprocedural states: Secondary | ICD-10-CM | POA: Insufficient documentation

## 2016-12-28 DIAGNOSIS — F341 Dysthymic disorder: Secondary | ICD-10-CM | POA: Insufficient documentation

## 2016-12-28 NOTE — Progress Notes (Signed)
Subjective:    Patient ID: Amy Lane, female    DOB: [birth year], 66 y.o.   MRN: [id number]  HPI 66 y/o WF here for a follow up visit... she has mult med problems as noted below... she is followed regularly by Drs Maryellen Pile for Cardiology, and Drs Jeri Modena for GI... ~  SEE PREV EPIC NOTES FOR OLDER DATA >>     LABS 4/14:  FLP- not at goals on diet alone but intol to all meds;  Chems- wnl;  CBC- wnl;  TSH=3.10;  VitD=19 & Rec VitD OTC supplement ~2000u daily...  April 11, 2013:  Amy Lane was [facility] 1/6 - 01/30/13 by [facility] w/ HA & altered mental status- ?TIA vs complex migraine- CT Head was neg; CDopplers were ok; 2DEcho showed norm LVF, myxomatous MV s/p repair no resid MR; Coumadin was continued and ASA81 was added... She had ROV DrWillis 2/15> he felt the episode was a migraine equiv & no recurrent prob since then, she notes mild memory impairment w/ MMSE= 29/30... She notes that she's been under a lot of stress w/ father-in-law illness, husb w/ prostate ca, sold house & had to move...   CXR 01/28/13 showed norm heart size, pacemeaker & sternal wires, hyperinflation and asymmetric biapical pleural thickening L>R, NAD...  2DEcho 01/28/13 showed norm LV size & function w/ EF=55-60%, myxomatous MV s/p MVrepair & no resid MR, mild LA dil...  LABS 4/15:  FLP- at goals on Zetia10;  Chems- wnl;  CBC- wnl;  TSH=4.99... ~  [id number]:  Amy Lane has been under some stress- husb Amy Lane) was Dx w/ recurrent prostate cancer at the [facility] & underwent XRT (they had to live in [place] for 28mo while he was receiving therapy);   LABS 3/16:  FLP- at goals on Zetia10;  Chems- wnl;  CBC- wnl;  TSH=3.24   Sed=15;  Anti-CCP=neg   ~  November 30, 2014:  75mo ROV & Amy Lane report a good interval overall- she had some constipation, went to the ER (given BE prep)/ then on to see DrJacobs in GI & started on [id number] which is working very well for her;  We reviewed the following medical problems during today's office visit  >>     AR> on Benedryl OTC & Nasonex prn; breathing is good w/o cough, sput, SOB, etc...    Hx Dysautonomia (POTS) & Ehlers-Danlos syndrome> followed by DrNishan/DrKlein & DrHawkes (seen 4/16 w/ hand pain/weakness, polyarthralgia, OA); BP= 118/70 w/o postural change, on Eliquis5Bid...    MVP, Myxomatous valve- s/p MVrepair (in [place])> on Eliquis5Bid; she is followed by DrKlein- seen 4/16 & stable pacer & no changes made...    Hx Tachy-brady, Fib-flutter w/ ablation & pacer> on Sectral200/d, & Eliquis5Bid; she prev did her own Protimes at home & called to the CC for adjustments=> changed to Eliquis5Bid.    Cerebrovasc Dis> on Eliquis5Bid, s/p left CAE 2005 by [facility] for asymptomatic left carotid stenosis w/ ulceration, & f/u CDopplers all ok...    ?DVT & ?PE while on Coumadin (during trip to [place])> she then checked her own protimes like they do in [place]...    CHOL> on Zetia10 since 1/15 Hosp where LDL was 155; FLP on Zetia10 3/16 shows TChol 167, TG 66, HDL 61, LDL 93; she is intol to all other meds...    Hypothy> on Synthroid75; lab 3/16 shows TSH= 3.24; continue same meds, take pill first thing in morn, etc...    Underweight> BMI in the 19-20 range & we discussed  nutritional supplements etc...     GI- Constip & Hems> on Align; c/o constip & now improved on Linzess per DrJacobs; last colon 2009 by DrJacobs w/ hems only & f/u planned 8yrs...    GYN- kraurosis vulvae (DIV)> local GYN, [facility] vulva clinic treatment & she is up to date on screening; Hx enterococcus UTI...    Migraine HAs & ?TIA> she is followed by [facility]- DrWillis; Hosp 1/15 w/ another episode, symptoms cleared, ASA81 added to her Coumadin which was later changed to Eliquis5Bid; she had c/o some leg weakness but EMG/NCV was normal & rec to try PT...    Anxiety/ Depression> on Ativan 1mg  prn & Celexa20 which she really thinks is helping...  EXAM shows Afeb, VSS, O2sat=98% on RA;  Marfanoid habitus;  HEENT- neg,  mallampati2;  Chest- clear w/o w/r/r;  Heart- RR w/o m/r/g;  Abd- soft, nontender;  Ext- neg w/o c/c/e;  Neuro- intact.. IMP/PLAN>>  OK Flu shot today;  Meds refilled- continue same and ROV follow up in 44mo...  ~  May 31, 2015:  32mo ROV & Amy Lane has had a lot going on neurologically in the interval>  She fell at home w/ fx bones in left foot- eval & cast by DrHewitt, just got the cast off to boot now; she had f/u DrWillis w/ mild cognitive impairment & he started Aricept5=>10 & pt feels much better, memory improved, not getting lost driving, etc;  Also says she was told "prob MS" (falls, weakness, bladder, etc) but they can't do MRI due to her pacer;  She has a great attitude, feeling well today, no prob w/ meds, due for CXR & FASTING blood work today... Given PREVNAR-13 today as well.    AR> on Benedryl OTC & Nasonex prn; breathing is good w/o cough, sput, SOB, etc; doing OK even w/ the pollen...    Hx Dysautonomia (POTS) & Ehlers-Danlos syndrome> followed by DrNishan/DrKlein & DrHawkes (seen 4/16 w/ hand pain/weakness, polyarthralgia, OA- we do not have more recent note); BP= 118/70 no postural change, on Eliquis5Bid.    MVP, Myxomatous valve- s/p MVrepair (in [place])> on Eliquis5Bid; she is followed by DrKlein- seen 4/16 & stable pacer & no changes made...    Hx Tachy-brady, Fib-flutter w/ ablation & pacer> on Sectral200/d, & Eliquis5Bid; she prev did her own Protimes at home & changed to Eliquis5Bid...    Cerebrovasc Dis> on Eliquis5Bid, s/p left CAE 2005 by [facility] for asymptomatic left carotid stenosis w/ ulceration, & f/u CDopplers all ok...    ?DVT & ?PE while on Coumadin (during trip to [place])> she then checked her own protimes like they do in [place]; now on eliquis5Bid & stable...    CHOL> on Zetia10 since 1/15 Hosp where LDL was 155; FLP on Zetia10 5/17 shows TChol 168, TG 105, HDL 60, LDL 87; she is intol to all other meds...    Hypothy> on Synthroid75; lab 5/17 shows TSH= 2.08; continue same  meds, take pill first thing in morn, etc...    Underweight> BMI in the 19-20 range & we discussed nutritional supplements etc=> wt up to 142#...     GI- Constip & Hems> on Align; c/o constip & now improved on Linzess per DrJacobs; last colon 2009 by DrJacobs w/ hems only & f/u planned 47yrs...    GYN- kraurosis vulvae (DIV)> local GYN, [facility] vulva clinic treatment & she is up to date on screening; Hx enterococcus UTI...    Migraine HAs & ?TIA, Mild cognitive impairment, ?MS> she is  followed by [facility]- DrWillis; Hosp 1/15 w/ another episode, symptoms cleared, ASA81 added to her Coumadin which was later changed to Eliquis5Bid; she had c/o some leg weakness but EMG/NCV was normal & rec to try PT;  She reports [facility]CI on Aricept & improved memory she says; DrWillis indicated that he thinks she has MS but they can't get MRI w/ her pacer...    Anxiety/ Depression> on Ativan 1mg  prn & Celexa20 which she really thinks is helping...  EXAM shows Afeb, VSS, O2sat=98% on RA;  Marfanoid habitus;  HEENT- neg, mallampati2;  Chest- clear w/o w/r/r;  Heart- RR w/o m/r/g;  Abd- soft, nontender;  Ext- neg w/o c/c/e;  Neuro- intact..  CXR 05/31/15>  Stable- norm heart size, s/p median sternotomy & MVR, pacer in situ, hyperinflated appearance suggests underlying COPD, scarring left apex, NAD...  LABS 05/31/15>  FLP- at goals on Zetia10;  Chems- wnl;  CBC- wnl;  TSH=2.08;   IMP/PLAN>>  Amy Lane looks good, she has gained a few lbs eating ice cream Qhs, yet her Lipids remain in good control, Labs normal, CXR clear; she is planning a [place] cruise this summer w/ a nurse friend; stable on current med rx- continue same, given PREVNAR-13 today...  ~  December 14, 2015:  39mo ROV & Amy Lane reports doing well- feels good and w/o complaints except a constant runny nose; a friend uses Atrovent nasal spray & she wonders if she can try this- Rx written for 1-2 sprays in each nostril Bid;  She is looking forward to another [place] vacation next  yr;  Her main source of exercise is walking... Prob List reviewed- given 2017 flu vaccine today...    She saw DrKlein 11/02/15>  Hx SBrady, s/p pacer w/ generator change and lead repair 03/2009; she has Ehlers-Danlos syndrome, marfanoid habitus, Hx MVP w/ MV repair in [place]; she has hx prior TIA, syncope from long QT syndrome; and signif dysautonomia c/w postural orthostatic tachycardia syndrome (POTS);  Pacer functioning well, no changes made to the programming; continue Eliquis...    She saw Cherly Hensen 09/20/15>  Note reviewed, no changes made in meds...     She saw DrWillis 06/15/15>  Note reviewed, Hx migraines, gait abn, reported gradual change in memory which improved significantly on Aricept5, Exam was neg, Prev Carotid dopplers 02/2015 & 06/2015 were OK showing heterogeneous plaque bilat w/ prev left CEA & DPA, 1-39% bilat ICSstenoses, norm subclav & norm vertebrals bilaterally...  IMP/PLAN>>  OK 2017 Flu shot, continue same meds and Rx written for Atrovent nasal spray at her request; we will continue Q26mo ROVs...  ~  Jun 12, 2016:  12mo ROV & Amy Lane has been doing well overall-- c/o recent allergy problems w/ head congestion, nasal drainage, watery eyes, sl sore throat & hoarseness she says; she has been using Atrovent nasal & OTC Benedryl w/ min relief;  She is traveling to [place] soon for a 3wk vacation & [place] river cruise- very excited & she says "the best I've felt since I was 41"... We reviewed the following medical problems during today's office visit >>     AR> on Atrovent nasal, Benedryl OTC & Nasonex prn; breathing is good w/o cough, sput, SOB, etc; now having a rough spring w/ the pollen => Rx w/ DYMISTA & PRED Dosepak...    Hx Dysautonomia (POTS) & Ehlers-Danlos syndrome> followed by DrNishan/DrKlein & DrHawkes (seen w/ hand pain/weakness, polyarthralgia, OA- she is doing PT and loves it!); BP= 100/60 no postural change.Marland KitchenMarland Kitchen  MVP, Myxomatous valve- s/p MVrepair (in [place])> on  Eliquis5Bid; she is followed by DrKlein & Nishan- stable pacer checks & no changes made...    Hx Tachy-brady, Fib-flutter w/ ablation & pacer> on Sectral200/d, & Eliquis5Bid; EKG 10/17 showed electronic atrial pacer, bifascicular block w/ RBBB & left post fascicular block...    Cerebrovasc Dis> on Eliquis5Bid, s/p left CAE 2005 by [facility] for asymptomatic left carotid stenosis w/ ulceration, & f/u CDopplers by [facility]=> last 6/17 w/ mild homogeneous plaque, no signif stenoses...    ?DVT & ?PE while on Coumadin (during trip to [place])> she then checked her own protimes like they do in [place] & now on Eliquis5Bid & stable...    CHOL> on Zetia10 (INTOL statins) since 1/15 Hosp where LDL was 155; FLP on Zetia10 5/18 shows TChol 159, TG 71, HDL 51, LDL 94; she is intol to all other meds...    Hypothy> on Synthroid75; lab 5/18 shows TSH= 9.50; REC- continue same med, take 1st thing in AM by itself, do not eat for 15min & recheck lab in 70mo => TSH=8.43 therefore incr to 110mcg/d...    Underweight> BMI in the 19-20 range & we discussed nutritional supplements etc=> wt is steady & she feels well...     GI- Constip & Hems> on Align; c/o constip & now improved on Linzess72/d per DrJacobs; last colon 2009 by DrJacobs w/ hems only & f/u planned 41yrs...    GYN- kraurosis vulvae (DIV)> local GYN, [facility] vulva clinic treatment & she will keep up to date on screening; Hx enterococcus UTI...    Migraine HAs & ?TIA, Mild cognitive impairment, ?MS> she is followed by [facility]- DrWillis; Hosp 1/15 w/ another episode, symptoms cleared, ASA81 added to her Coumadin which was later changed to Eliquis5Bid; she had c/o some leg weakness but EMG/NCV was normal & rec to try PT=> much improved;  She reports MCI on Aricept & improved memory she says; DrWillis indicated that he thinks she has MS but they can't get MRI w/ her pacer...    Vit D deficiency>  Labs 5/18 showed VitD level = 24 & she is rec to start VitD 2000u  daily OTC supplement...    Anxiety/ Depression> on Ativan 1mg  prn (takes it Qhs) & Celexa20 which she really thinks is helping...  EXAM shows Afeb, VSS, O2sat=96% on RA;  Marfanoid habitus;  HEENT- neg, mallampati2;  Chest- clear w/o w/r/r;  Heart- RR w/o m/r/g;  Abd- soft, nontender;  Ext- neg w/o c/c/e;  Neuro- intact.Marland Kitchen  2DEcho 03/2016 showed mild conc LVH, norm LVF w/ EF=60-65%, no regional wall motion abn, Gr2DD, MV is mod thickened & myxomatous- s/p repair, triv MR...   CXR 06/12/16 (independently reviewed by me in the PACS system) showed hyperinflation/ body habitus, no focal parenchymal abn- NAD, norm heart size w/ prior median sternotomy & dual lead pacer in good position...   LABS 06/12/16>  FLP- all parameters at goals on Zetia10;  Chems- wnl w/ BS=96, Cr=0.82, LFTs wnl;  CBC- wnl w/ Hg=13.6;  TSH=9.50 & advised proper dosing & compliance w/ recheck;  VitD=24 & rec 2000u/d supplement;  HepC=neg...  IMP/PLAN>>  For her AR, allergy symptoms we will Rx w/ the addition of Dymista & Pred Dosepak;  Her TSH is elev & I suspect compliance vs interference in absorption- rec to take 1st thing in the AM by itself & do not eat or drink for 65min, we will recheck TSH in 31mo;  Routine check of Vit D level  is low at 24 & she is rec to start VitD 2000u/d OTC supplement... She will contact GYN for routine f/u visit due...  ~  July 25, 2016:  6wk ROV & add-on appt> Amy Lane returned from her fabulous [place] vacation & reports that mult members of her part got ill w/ URI symptoms; Amy Lane remained well until her return to [employer]- c/o sore throat, cough- small amt thick clear sput, no hemoptysis, low grade fever & chills, incr SOB- feels heavy in the chest/ tight/ some wheezing, denies CP/ palpit/ etc;  She's been taking OTC meds + the Dymista & Atrovent nasal... She is also due for a follow up TSH & confirms regular dosing/ 1st thing in the AM/ not w/ other meds/ & NPO for 45 min after the dose;  She is reminded to start  regular VitD supplement ~2000u OTC Vit D supplement daily...    EXAM shows Afeb, VSS, O2sat=97% on RA;  Marfanoid habitus;  HEENT- neg, mallampati2;  Chest- few bibasilar rhonchi, no w/r/consolidation;  Heart- RR w/ gr1/6 sys murmur, no r/g;  Abd- soft, nontender;  Ext- neg w/o c/c/e...  CXR 07/25/16 (independently reviewed by me in the PACS system) showed similar to prev films- clear- NAD & no signs of pneumonia...  LABS 07/25/16>  CBC- wnl;  Sed=26;  TSH= 8.43 on Synthroid75... IMP/PLAN>>  Amy Lane has an upper resp infection after her [place] vacation during which several members of her team got ill;  CXR is clear & CBC is OK;  We discussed Rx w/ LEVAQUIN 500mg  x7d & Depo80 today followed by Pred dosepak starting tomorrow; also rest, fluids, etc;  We will increase her SYNTHROID to 114mcg/d & recheck labs just after Labor Day in Sept;  She is reminded to get an OTC VitD supplement ~2000u/d regularly...   ADDENDUM>>  LABS 09/26/16 on Synthroid 173mcg/d showed TSH=0.05;  This is sl over-replaced & REC to decrease by 1/2 step to SYNTHROID 39mcg tabs- one tab Qam (call in #30 or #90) and plan f/u TSH again in 36mo... ADDENDUM>>  LABS 12/19/16 on Synthroid 5mcg/d showed TSH=0.30 => REC to continue same for now...   ~  December 27, 2016:  39mo ROV & Amy Lane reports a good interval without new complaints or concerns... We reviewed the following interval medical notes in Epic>      She saw GYN- EKey,NP on 10/03/16>  Had some spotting, note reviewed, exam showed some atrophy- no lesions & they planned sonar (we do not have results) & careful follow up...     She saw [facility]- DrNishan 12/11/16>  Hx bradycardia- s/p pacer followed by EP & doing satis; hx Ehlers-Danlos syndrome w/ MVP- s/p MV repair in [place]; hx TIA & L-CAE and doing well, no changes made & he continues to follow Q80mo...    She saw [facility]/EP- DrKlein on 12/25/16>  Hx sinus brady- s/p pacer w/ generator change & lead repair 03/2009; hx Ehlers-Danlos, MVP- s/p MV  repair at [facility]; & hx  TIA on Eliquis now- s/p CAE; also has long QT syndrome and dysautonomic symptoms c/w POTS;  She is doing well, gets pacer checks from home, rec to f/u 49yr... We reviewed the following medical problems during today's office visit>      AR> on Atrovent nasal, Benedryl OTC & Nasonex prn; breathing is good w/o cough, sput, SOB, etc; hx of rough time in spring w/ pollen => Rx w/ DYMISTA & PRED Dosepak...    Hx Dysautonomia (POTS) & Ehlers-Danlos syndrome> followed  by DrNishan/DrKlein & DrHawkes (seen w/ hand pain/weakness, polyarthralgia, OA- she is doing PT and loves it!); BP= 100/68 no postural change...    MVP, Myxomatous valve- s/p MVrepair (in [place])> on Eliquis5Bid; she is followed by DrKlein & Nishan- stable pacer checks & no changes made...    Hx Tachy-brady, Fib-flutter w/ ablation & pacer> on Sectral200/d, & Eliquis5Bid; EKG 10/17 showed electronic atrial pacer, bifascicular block w/ RBBB & left post fascicular block...    Cerebrovasc Dis> on Eliquis5Bid, s/p left CAE 2005 by [facility] for asymptomatic left carotid stenosis w/ ulceration, & f/u CDopplers by [facility]=> last 6/17 w/ mild homogeneous plaque, no signif stenoses...    ?DVT & ?PE while on Coumadin (during trip to [place])> she then checked her own protimes like they do in [place] & now on Eliquis5Bid & stable...    CHOL> on Zetia10 (INTOL statins) since 1/15 Hosp where LDL was 155; FLP on Zetia10 5/18 shows TChol 159, TG 71, HDL 51, LDL 94; she is intol to all other meds...    Hypothy> on Synthroid88 now; rec to take 1st thing in AM by itself, do not eat for 58min & we follow labs => TSH Nov2018 = 0.30 & we decided to keep the same for now...    Underweight> BMI in the 19-20 range & we discussed nutritional supplements etc=> wt is steady & she feels well...     GI- Constip & Hems> on Align; c/o constip & now improved on Linzess72/d per DrJacobs; last colon 2009 by DrJacobs w/ hems only & f/u planned  62yrs...    GYN- kraurosis vulvae (DIV)> local GYN, [facility] vulva clinic treatment & she will keep up to date on screening; Hx enterococcus UTI...    Migraine HAs & ?TIA, Mild cognitive impairment, ?MS> she is followed by [facility]- DrWillis; Hosp 1/15 w/ another episode, symptoms cleared, ASA81 added to her Coumadin which was later changed to Eliquis5Bid; she had c/o some leg weakness but EMG/NCV was normal & rec to try PT=> much improved;  She reports MCI on Aricept & improved memory she says; DrWillis indicated that he thinks she has MS but they can't get MRI w/ her pacer...    Vit D deficiency>  Labs 5/18 showed VitD level = 24 & she is rec to start VitD 2000u daily OTC supplement...    Anxiety/ Depression> on Ativan 1mg  prn (takes it Qhs) & Celexa20 which she really thinks is helping...  EXAM shows Afeb, VSS, O2sat=97% on RA;  Marfanoid habitus;  HEENT- neg, mallampati2;  Chest- few bibasilar rhonchi, no w/r/consolidation;  Heart- RR w/ gr1/6 sys murmur, no r/g;  Abd- soft, nontender;  Ext- neg w/o c/c/e... IMP/PLAN>>  Amy Lane is stable & advised to continue current meds the same;  She will remain active, continue nutritional supplements, call for any problems; we plan f/u 35mo...           Problem List:      ALLERGIC RHINITIS (ICD-477.9) - we discussed Rx w/ Zyrtek, Astepro Prn, etc... ~  CXR 3/11 showed left pacer, sternal wires, NAD.Marland Kitchen. ~  CXR 6/13 showed stable post-op changes and pacer, pulm hyperinflation & scattered scarringt, calcif mediastinal nodes, no focal opacities, NAD.Marland Kitchen. ~  CXR 1/15 showed norm heart size, post op changes & pacer, no adenop, no edema, clear x some left apical pleural thickening w/o change...  MITRAL VALVE PROLAPSE (ICD-424.0) - followed by Cherly Hensen... ~  2DEcho 11/08 showed myxomatous MV, annuloplasty ring, decr post leaflet excursion, norm LVH &  wall motion... ~  NuclearStressTest 11/08 showed mild apical thinning... prev study 12/04 w/o ischemia or infarct &  EF=68%... ~  she sees [place] every 6 months- for f/u MVP (s/p MV repair at [facility]), dysautonomia (s/p pacer), & Ehlers-Danlos Syndrome> last seen 1/15 & note reviewed, EKG w/ irreg rhythm, borderline QT interval... ~  2DEcho 1/15> cavity size was normal, wall thickness was normal w/ norm systolic function was normal & EF= 55% to 60%, myxomatous MV s/p repair with no residual MR, LA is mildly dilated.  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 hx AFib/Flutter w/ ablation & pacemaker placed ... she has Ehlers-Danlos synd w/ prolonged QT interval...  CARDIAC PACEMAKER IN SITU (ICD-V45.01) - f/u DrKlein w/ CTChest 4/09- neg. ~  dual chamber pacer changed 3/11 by DrKlein for end-of-life... ~  EKG shows NSR, 1st degree AVB, RBBB... ~  3/15: she had f/u DrKlein> note reviewed, pacer functioning well- no changes made but he wanted her to consider a NOAC...  DYSAUTONOMIA (ICD-742.8) - prev on Proamatine 5mg  tabs per DrKlein, but this was stopped...  CEREBROVASCULAR DISEASE (ICD-437.9) - on ASA 81mg /d & COUMADIN => 11/2014 she was switched to Eliquis5Bid... ~  she had a Left CAE 11/05 by [facility] for symptomatic left carotid stenosis w/ ulceration... ~  Carotid angiogram 5/08 by [facility] showed <30% right carotid stenosis ~  f/u CTA of Head & Neck 12/09 was neg= norm CTA head, & no signif carotid dis noted in neck. ~  CDoppler 2/11 showed stable mild carotid dis, left CAE w/ DPA is patent, 0-39% bilat ICA stenoses. ~  repear CDopplers 11/11 per Cherly Hensen showed smooth plaque in right bulb & distal left CCA- stable; 0-39% bilat ICA stenoses... ~  CT Head 6/13 is neg- No acute intracranial abnormalities... ~  CTA Brain 6/13 showed mild atherosclerotic dis in the carotid siphon regions bilat w/o stenoses, otherw norm CTA brain w/o lesions... ~  CT Angio Neck 6/13 via ER showed mild nonstenotic arteriosclerotic dis at both carotid bifurcations w/o  signif stenoses... ~  CDopplers 11/13 showed mild soft smooth plaque w/ 0-39% right ICAstenosis & 40-59% left ICA stenosis, vertebrals are antegrade... ~  CDopplers 1/15 showed 1-39% internal carotid artery stenosis bilaterally & antegrade vertebral flow... ~  CDopplers 2/16 showed mixed plaque bilat, stable 1-39% bilat ICAstenoses w/ left CEA & DPA, norm subclav & patent vertebrals...  ?DVT & poss PTE while on vacation in [place] during the summer of 2012 >> pt was on Coumadin & INR was therapeutic; she had pain & swelling in left leg w/ a pos doppler reported by a [place] physician; she was also SOB & they questioned poss PTE but didn't get scan or further eval;  On return to [place] DrNishan wondered if she might have bled into the leg muscles & sent her to DrNorris for Ortho eval; he could not corroborate the theory & treated her w/ compression stockings & she gradually improved towards her baseline...  HYPERCHOLESTEROLEMIA (ICD-272.0) - on diet alone now, prev on Lip20- this was stopped 1/12 by Cherly Hensen & she feels much better off this med! ~  [place] 05/13/07 on Lip20 showed TChol 119, TG 50, HDL 55, LDL 54 ~  FLP 5/10 on Lip20 showed TChol 120, TG 43, HDL 58, LDL 54 ~  FLP 9/11 on Lip20 showed TChol 130, TG 54, HDL 59, LDL 60 ~  FLP 5/12 on diet alone showed TChol 218, TG 91, HDL 57, LDL 135.Marland KitchenMarland Kitchen  Continue low chol, low fat diet. ~  FLP 10/12 showed TChol 231, TG 86, HDL 68, LDL 151... I rec the Lipid CLinic, & they started NIACIN 500mg /d. ~  FLP 2/13 on Niacin500 showed TChol 199, TG 55, HDL 76, LDL 112... Much improved, continue same. ~  FLP 11/13 on diet alone showed TChol 231, TG 49, HDL 67, LDL 151..,. rec refer to Lipid clinic. ~  [place] 4/14 on diet + Niacin showed TChol 233, TG 78, HDL 75, LDL 146  ~  FLP 1/15 in hosp showed Tchol 235, TG 79, HDL 64, LDL 155 => ZETIA 10mg /d started... ~  [place] 3/15 on Zetia10 showed TChol 172, TG 74, HDL 59, LDL 89... Continue same... ~  [place] 3/16 on Zetia10 showed  TChol 167, TG 66, HDL 61, LDL 93  HYPOTHYROIDISM (ICD-244.9) - currently on SYNTHROID 87mcg/d... ~  labs 4/09 on Levoth88 showed TSH = 0.10... rec to decr Synthroid to 16mcg/d... ~  labs 9/09 on Levoth75 showed TSH = 1.67 ~  labs 5/10 on Levoth75 showed TSH = 2.92... Continue same. ~  labs 9/11 on Levoth75 showed TSH= 1.37 ~  Labs 5/12 on Levothy75 showed TSH= 4.33 ~  Labs 10/12 on Levothy75 showed TSH= 1.51... Continue same. ~  Labs 11/13 on Levothy75 showed TSH= 1.55 ~  Labs 4/14 on Levothy75 showed TSH= 3.10 ~  Labs 3/15 on Levothy75 showed TSH= 4.99... Reminded to take every AM on empty stomach etc. ~  Labs 3/16 showed TSH= 3.24  HEMORRHOIDS (ICD-455.6) - last colonoscopy 2/09 by DrJacobs showed only sm hems... f/u 59yrs.  GYN - followed by DrMcPhail and Dx w/ kraurosis vulvae 7/10... Prev on Premarin VagCream. ~  11/10: she discussed the Dx w/ me & will inquire about poss hyst/ BSO... ~  4/11:  eval in the [facility] by Dr Illene Bolus showed vulvar dermatitis & dryness w/ rec for "Crisco" Tid! 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   FIBROMYALGIA (ICD-729.1) - she has been doing Yoga classes and this has helped... ~  She saw DrNorris 9/14 w/ neck pain> CT CSpine showed degen disc dis & he rec PT... ~  3/16: she is c/o persistent bilat hand pain & swelling, not much better w/ OTC NSAIDs; we decided to check Anti-CCP (neg) & Sed (15); we will Rx w/ Tramadol50Tid prn, hot soaks, & refer to Rheum for further eval...  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  Hx of TRANSIENT ISCHEMIC ATTACK (ICD-435.9) - on ASA 81mg /d & COUMADIN> she has been followed by DrWillis; s/p left carotid endarterectomy 11/05 by [facility]. ~  1/12: f/u eval by DrWillis reviewed... ~  6/13: she presented w/ ?TIA symptoms & eval was neg; review by  DrWillis indicated prob Migraine & everything resolved... ~  1/15: she had another episode, Hosp by [facility] x2d but felt to prob be Migraine related... ~  11/15: she had f/u appt w/ DrWillis> Hx Ehlers-Danlos, Migraines, & c/o some leg weakness (some difficulty rising from squat); he did EMG/NCV (neg-no myopathy, no neuropathy) & rec phys therapy  Hx of SYNCOPE (ICD-780.2) - felt to be neurally mediated, ? related to long QT, no recur since pacer placed...  MIGRAINE HEADACHE >> she presented w/ ?TIA symptoms 6/13 & eval was neg; review by DrWillis indicated prob Migraine & everything resolved, no recurrence... ~  Seen by Neuro 2/15  DrWillis w/ migraines, memory deficit, abn of gait;   DEPRESSION (ICD-311) - she takes LORAZEPAM 1mg Qhs for insomnia... she prev saw DrGraves for counselling in [place]... ~  11/10: poor appetite & some wt loss prob related to depression & we discussed trial Lexapro 10mg /d... ~  3/11: she is very pleased- feels better, gained 5# w/ Ensure, etc... ~  9/11:  "I feel great" & Lexapro continued... ~  She was able to stop the Lexapro 1/12 (also stopped the Lipitor) & has done well off it ever since... ~  10/13:  She called w/ situational depression due to marital stress, getting counseling, etc; started PROZAC 20mg /d & she reports much improved... ~  2015: she is off the Prozac and using Ativan 1mg  prn... ~  9/15: on Ativan1mg  prn and CELEXA20 called in several mo ago & she is much improved...  CARCINOMA, BASAL CELL (ICD-173.9) - skin cancer removed from left leg by [facility] 2008. ~  12/11:  She reports bx of rash on legs= lichen planus per DrHall, treated w/ topical ointment... ~  2/13:  She had a skin lesion removed from her neck= basal cell ca & wide excision performed by DrLupton.  Health Maintenance: ~  GI:  followed by DrJacobs w/ colonoscopy 2/09 showing only sm hems... ~  GYN:  followed by DrMcPhail, and Dr. Illene Bolus at [facility]... ~  Labs 9/11  showed Vit D level = 39 & rec to start 1000 u OTC supplement... ~  Immunizations:  she had TDAP 3/11... she gets yearly Flu vaccine in the Fall of the yr...    Past Surgical History:  Procedure Laterality Date  . CAROTID ENDARTERECTOMY    . complex mitral valve repair     at the [facility]  . INSERT / REPLACE / REMOVE PACEMAKER    . left carotid endarterectomy  11/2003   by Dr. Delton See  . PACEMAKER INSERTION     medtronic kappa 901  . removed chalazion from left eye lid  09/2011   Dr. Satira Sark    Outpatient Encounter Medications as of 12/27/2016  Medication Sig  . acebutolol (SECTRAL) 200 MG capsule TAKE ONE CAPSULE BY MOUTH AT BEDTIME  . Azelastine-Fluticasone (DYMISTA) 137-50 MCG/ACT SUSP 1-2 sprays in each nostril BID as needed  . citalopram (CELEXA) 20 MG tablet TAKE 1 TABLET EVERY DAY  . donepezil (ARICEPT) 5 MG tablet Take 1 tablet (5 mg total) by mouth at bedtime.  Marland Kitchen ELIQUIS 5 MG TABS tablet TAKE 1 TABLET TWICE A DAY  . ezetimibe (ZETIA) 10 MG tablet TAKE 1 TABLET BY MOUTH EVERY DAY  . ipratropium (ATROVENT) 0.06 % nasal spray PLACE 2 SPRAYS INTO BOTH NOSTRILS 2 (TWO) TIMES DAILY.  Marland Kitchen levothyroxine (SYNTHROID, LEVOTHROID) 88 MCG tablet TAKE 1 TABLET (88 MCG TOTAL) BY MOUTH DAILY BEFORE BREAKFAST.  Marland Kitchen linaclotide (LINZESS) 72 MCG capsule Take 1 capsule (72 mcg total) by mouth daily before breakfast. (Patient taking differently: Take 72 mcg by mouth daily before breakfast. Pt takes 1 tablet 3 times weekly)  . LORazepam (ATIVAN) 1 MG tablet TAKE 1 TABLET BY MOUTH 3 TIMES A DAY AS NEEDED  . Probiotic Product (ALIGN) 4 MG CAPS Take 4 mg by mouth every morning.  . traMADol (ULTRAM) 50 MG tablet Take 1 tablet (50 mg total) by mouth 3 (three) times daily as needed for moderate pain.   No facility-administered encounter medications on file as of 12/27/2016.     Allergies  Allergen Reactions  . Other  Other (See Comments)    All QTc prolongation drugs  . Statins Other (See  Comments)    myalgias    Immunization History  Administered Date(s) Administered  . Influenza Split 11/21/2010, 11/21/2011  . Influenza Whole 02/14/2007, 10/16/2007, 10/13/2009  . Influenza,inj,Quad PF,6+ Mos 11/19/2012, 10/06/2013, 11/30/2014, 11/29/2015, 10/03/2016  . Pneumococcal Conjugate-13 05/31/2015  . Pneumococcal Polysaccharide-23 01/24/2003  . Td 04/16/2009    Current Medications, Allergies, Past Medical History, Past Surgical History, Family History, and Social History were reviewed in [employer] record.    Review of Systems         See HPI - all other systems neg except as noted... The patient complains of dyspnea on exertion.  The patient denies anorexia, fever, weight loss, weight gain, vision loss, decreased hearing, hoarseness, chest pain, syncope, peripheral edema, prolonged cough, headaches, hemoptysis, abdominal pain, melena, hematochezia, severe indigestion/heartburn, hematuria, incontinence, muscle weakness, suspicious skin lesions, transient blindness, difficulty walking, depression, unusual weight change, abnormal bleeding, enlarged lymph nodes, and angioedema.     Objective:   Physical Exam     WD, Thin, 66 y/o WF in NAD... she is 5'11"Tall and 141# = BMI~20... Vital Signs:  Reviewed... GENERAL:  Alert & oriented; pleasant & cooperative... HEENT:  [place]/AT, EOM-wnl, PERRLA, EACs-clear, TMs-wnl, NOSE-clear, THROAT-clear & wnl. NECK:  Supple w/ fair ROM; no JVD; normal carotid impulses w/o bruits, left CAE scar; no thyromegaly or nodules palpated; no lymphadenopathy. CHEST:  Few bibasilar rhonchi, no wheezing/ rales/ or signs of consolidation... HEART:  Regular Rhythm; pacer on left, without murmurs/ rubs/ or gallops detected... ABDOMEN:  Soft & nontender; normal bowel sounds; no organomegaly or masses palpated... EXT: without deformities or arthritic changes; no varicose veins/ venous insuffic/ or edema. NEURO:  CN's intact;  no focal  neuro deficits... DERM:  s/p skin cancer removed from left leg; min rash noted...  RADIOLOGY DATA:  Reviewed in the EPIC EMR & discussed w/ the patient...  LABORATORY DATA:  Reviewed in the EPIC EMR & discussed w/ the patient...   Assessment & Plan:    MVP/ MV Repair> Tachy-Brady/ PACER> Dysautonomia>  Followed by Cherly Hensen & DrKlein, doing well on Eliquis & Sectral, continue same... 12/27/16>  Amy Lane is stable & advised to continue current meds the same;  She will remain active, continue nutritional supplements, call for any problems; we plan f/u 43mo   Cerebrovasc Dis>  Off ASA now that she's on EliquisBid, s/p left CAE 11/05, no recurrent cerebral ischemic symptoms & CDopplers stable...  ?DVT while in [place]> moot issue at present; prev on Coumadin now Eliquis; leg swelling diminished on the compression hose, no salt, elevation, etc; she is back to her exercise program & back to baseline...  CHOL>  She feels much better off the Lipitor & FLP improved w/ Zetia10 started 1/15 [facility]- continue same...  Hypothyroid>  Stable on the Synthroid 68mcg/d...  GYN>  Stable & followed both here & at [facility] (see above)...  EHLERS-DANLOS>  Aware, stable, she has been feeling better & in great spirits...  Migraines, ?TIA, ?MS, mild cognitive impairment> [facility] 6/13 & 1/15 w/ ?TIA but DrWillis thought likely Migraine syndrome- resolved, neg work up, no recurrence & they are following... Neuro> she mentions several symtoms that she is concerned about- clumsy, balance off, subjective leg weakness- & she is concerned; therefore refer to Neurology for eval... 5/17> she indicates that DrWillis Dx mild cognitive impairment & treated w/ Aricept10 & she feels much better she says  Depression>  Off  Prozac20 & on Celexa20 & Ativan prn; she & husb received counseling...  Other medical problems as noted... Routine f/u & CPX 06/12/16>   For her AR, allergy symptoms we will Rx w/ the addition of Dymista & Pred Dosepak;   Her TSH is elev & I suspect compliance vs interference in absorption- rec to take 1st thing in the AM by itself & do not eat or drink for 35min, we will recheck TSH in 57mo;  Routine check of Vit D level is low at 24 & she is rec to start VitD 2000u/d OTC supplement... 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     Medication List        Accurate as of 12/27/16 11:59 PM. Always use your most recent med list.          acebutolol 200 MG capsule Commonly known as:  SECTRAL TAKE ONE CAPSULE BY MOUTH AT BEDTIME   ALIGN 4 MG Caps   Azelastine-Fluticasone 137-50 MCG/ACT Susp Commonly known as:  DYMISTA 1-2 sprays in each nostril BID as needed   citalopram 20 MG tablet Commonly known as:  CELEXA TAKE 1 TABLET EVERY DAY   donepezil 5 MG tablet Commonly known as:  ARICEPT Take 1 tablet (5 mg total) by mouth at bedtime.   ELIQUIS 5 MG Tabs tablet Generic drug:  apixaban TAKE 1 TABLET TWICE A DAY   ezetimibe 10 MG tablet Commonly known as:  ZETIA TAKE 1 TABLET BY MOUTH EVERY DAY   ipratropium 0.06 % nasal spray Commonly known as:  ATROVENT PLACE 2 SPRAYS INTO BOTH NOSTRILS 2 (TWO) TIMES DAILY.   levothyroxine 88 MCG tablet Commonly known as:  SYNTHROID, LEVOTHROID TAKE 1 TABLET (88 MCG TOTAL) BY MOUTH DAILY BEFORE BREAKFAST.   linaclotide 72 MCG capsule Commonly known as:  LINZESS Take 1 capsule (72 mcg total) by mouth daily before breakfast.   LORazepam 1 MG tablet Commonly known as:  ATIVAN TAKE 1 TABLET BY MOUTH 3 TIMES A DAY AS NEEDED   traMADol 50 MG  tablet Commonly known as:  ULTRAM Take 1 tablet (50 mg total) by mouth 3 (three) times daily as needed for moderate pain.

## 2017-01-10 ENCOUNTER — Other Ambulatory Visit: Payer: Self-pay | Admitting: *Deleted

## 2017-01-10 MED ORDER — IPRATROPIUM BROMIDE 0.06 % NA SOLN: 2.0000 | Freq: Two times a day (BID) | NASAL | 2 refills | Status: DC

## 2017-01-11 ENCOUNTER — Other Ambulatory Visit: Payer: Self-pay | Admitting: *Deleted

## 2017-01-11 MED ORDER — IPRATROPIUM BROMIDE 0.06 % NA SOLN: 2.0000 | Freq: Two times a day (BID) | NASAL | 2 refills | Status: DC

## 2017-01-19 ENCOUNTER — Telehealth: Payer: Self-pay | Admitting: Pulmonary Disease

## 2017-01-19 MED ORDER — DOXYCYCLINE HYCLATE 100 MG PO TABS: ORAL_TABLET | ORAL | 0 refills | Status: DC

## 2017-01-19 NOTE — Telephone Encounter (Signed)
Ok to offer doxycycline 10 mg, # 8, 2 today then one daily Ok to use otc cold and flu symptom remedies as needed

## 2017-01-19 NOTE — Telephone Encounter (Signed)
Called spoke with patient who c/o sore throat, runny nose, bilateral ear congestion, yellow/green nasal drainage onset today, PND, some dry cough, chills/sweats, fatigue x2 days.  Denies any known fever, wheezing, dyspnea, tightness, prod cough, hemoptysis, chest pain  SN scheduled off  Dr Annamaria Boots please advise, thank you  [facility] Allergies  Allergen Reactions  . Other Other (See Comments)    All QTc prolongation drugs  . Statins Other (See Comments)    myalgias

## 2017-01-19 NOTE — Telephone Encounter (Signed)
Spoke with pt, aware of recs.  rx sent to preferred pharmacy.  Nothing further needed.  

## 2017-02-05 ENCOUNTER — Ambulatory Visit (INDEPENDENT_AMBULATORY_CARE_PROVIDER_SITE_OTHER): Payer: 59 | Admitting: *Deleted

## 2017-02-05 DIAGNOSIS — I495 Sick sinus syndrome: Secondary | ICD-10-CM

## 2017-02-06 NOTE — Progress Notes (Signed)
Remote pacemaker transmission.   

## 2017-02-07 LAB — CUP PACEART REMOTE DEVICE CHECK
Battery Impedance: 525 Ohm
Battery Voltage: 2.79 V
Brady Statistic AP VP Percent: 0 %
Brady Statistic AP VS Percent: 74 %
Brady Statistic AS VS Percent: 25 %
Implantable Lead Implant Date: 19981106
Implantable Lead Location: 753859
Implantable Lead Model: 5076
Lead Channel Impedance Value: 413 Ohm
Lead Channel Impedance Value: 722 Ohm
Lead Channel Pacing Threshold Amplitude: 0.625 V
Lead Channel Pacing Threshold Pulse Width: 0.4 ms
Lead Channel Pacing Threshold Pulse Width: 0.4 ms
Lead Channel Setting Pacing Amplitude: 2.5 V
Lead Channel Setting Pacing Pulse Width: 0.4 ms
MDC IDC LEAD IMPLANT DT: 20031031
MDC IDC LEAD LOCATION: 753860
MDC IDC MSMT BATTERY REMAINING LONGEVITY: 88 mo
MDC IDC MSMT LEADCHNL RV PACING THRESHOLD AMPLITUDE: 0.875 V
MDC IDC PG IMPLANT DT: 20110310
MDC IDC SESS DTM: 20190114152653
MDC IDC SET LEADCHNL RA PACING AMPLITUDE: 2 V
MDC IDC SET LEADCHNL RV SENSING SENSITIVITY: 2.8 mV
MDC IDC STAT BRADY AS VP PERCENT: 0 %

## 2017-02-09 ENCOUNTER — Encounter: Payer: Self-pay | Admitting: Cardiology

## 2017-02-13 LAB — CUP PACEART INCLINIC DEVICE CHECK
Battery Impedance: 524 Ohm
Brady Statistic AP VP Percent: 0 %
Brady Statistic AP VS Percent: 81 %
Brady Statistic AS VP Percent: 0 %
Brady Statistic AS VS Percent: 19 %
Date Time Interrogation Session: 20181203143654
Implantable Lead Implant Date: 19981106
Implantable Lead Implant Date: 20031031
Implantable Lead Location: 753859
Implantable Lead Location: 753860
Implantable Lead Model: 5076
Lead Channel Impedance Value: 656 Ohm
Lead Channel Pacing Threshold Amplitude: 0.75 V
Lead Channel Sensing Intrinsic Amplitude: 11.2 mV
Lead Channel Sensing Intrinsic Amplitude: 2 mV
Lead Channel Setting Pacing Pulse Width: 0.46 ms
MDC IDC MSMT BATTERY REMAINING LONGEVITY: 87 mo
MDC IDC MSMT BATTERY VOLTAGE: 2.79 V
MDC IDC MSMT LEADCHNL RA IMPEDANCE VALUE: 402 Ohm
MDC IDC MSMT LEADCHNL RA PACING THRESHOLD PULSEWIDTH: 0.4 ms
MDC IDC MSMT LEADCHNL RV PACING THRESHOLD AMPLITUDE: 0.75 V
MDC IDC MSMT LEADCHNL RV PACING THRESHOLD PULSEWIDTH: 0.46 ms
MDC IDC PG IMPLANT DT: 20110310
MDC IDC SET LEADCHNL RA PACING AMPLITUDE: 2 V
MDC IDC SET LEADCHNL RV PACING AMPLITUDE: 2.5 V
MDC IDC SET LEADCHNL RV SENSING SENSITIVITY: 2.8 mV

## 2017-02-18 ENCOUNTER — Other Ambulatory Visit: Payer: Self-pay | Admitting: Pulmonary Disease

## 2017-03-14 ENCOUNTER — Encounter: Payer: Self-pay | Admitting: Gastroenterology

## 2017-03-17 ENCOUNTER — Other Ambulatory Visit: Payer: Self-pay | Admitting: Pulmonary Disease

## 2017-03-19 NOTE — Telephone Encounter (Signed)
Patient is requesting refill of Ativan 1 mg three times a day as needed. Patient's last OV was in December.  Dr. Lenna Gilford please advise. Thank you!

## 2017-03-20 ENCOUNTER — Other Ambulatory Visit: Payer: Self-pay | Admitting: *Deleted

## 2017-03-20 DIAGNOSIS — E039 Hypothyroidism, unspecified: Secondary | ICD-10-CM

## 2017-03-20 MED ORDER — CITALOPRAM HYDROBROMIDE 20 MG PO TABS: 20.0000 mg | ORAL_TABLET | Freq: Every day | ORAL | 3 refills | Status: DC

## 2017-03-20 MED ORDER — LEVOTHYROXINE SODIUM 88 MCG PO TABS: 88.0000 ug | ORAL_TABLET | Freq: Every day | ORAL | 2 refills | Status: DC

## 2017-04-11 ENCOUNTER — Other Ambulatory Visit: Payer: Self-pay | Admitting: *Deleted

## 2017-04-11 MED ORDER — IPRATROPIUM BROMIDE 0.06 % NA SOLN: 2.0000 | Freq: Two times a day (BID) | NASAL | 2 refills | Status: DC

## 2017-05-01 ENCOUNTER — Telehealth: Payer: Self-pay | Admitting: Neurology

## 2017-05-01 DIAGNOSIS — G459 Transient cerebral ischemic attack, unspecified: Secondary | ICD-10-CM

## 2017-05-01 NOTE — Telephone Encounter (Signed)
Pt reports she had an episode last night she thinks it was aphasia. It last 30-40 minutes, she had trouble finding words and getting them out. She realized what was happening and decided to stay quite. Her husband came home and saw it in her eyes and knew something was wrong. Then she got a terrible HA, then 30 min later it went away, everything became normal. The whole episode lasted for about 1 hour. She has not had any issues since, today she feels ok. Please call to touch base.

## 2017-05-01 NOTE — Telephone Encounter (Signed)
I called the patient.  The patient had an event of difficulty with speech, slight confusion.  This lasted for about 40 minutes and then cleared.  The patient got a headache that was significant for about 30 minutes and then went away.  The patient indicates that he does not feel like her usual migraine which lasts for more than just 30 minutes.  The patient has had episodes of confusion with headache in the past.  The patient is on Eliquis.  She will remain on this.  We will check a carotid Doppler study.  She has a revisit set up in the next 3-4 weeks.

## 2017-05-07 ENCOUNTER — Ambulatory Visit (INDEPENDENT_AMBULATORY_CARE_PROVIDER_SITE_OTHER): Payer: 59 | Admitting: *Deleted

## 2017-05-07 ENCOUNTER — Telehealth: Payer: Self-pay | Admitting: Cardiology

## 2017-05-07 DIAGNOSIS — I495 Sick sinus syndrome: Secondary | ICD-10-CM

## 2017-05-07 NOTE — Telephone Encounter (Signed)
LMOVM reminding pt to send remote transmission.   

## 2017-05-08 NOTE — Progress Notes (Signed)
Remote pacemaker transmission.   

## 2017-05-09 ENCOUNTER — Encounter: Payer: Self-pay | Admitting: Cardiology

## 2017-05-09 LAB — CUP PACEART REMOTE DEVICE CHECK
Battery Remaining Longevity: 84 mo
Brady Statistic AP VS Percent: 78 %
Brady Statistic AS VP Percent: 0 %
Brady Statistic AS VS Percent: 21 %
Date Time Interrogation Session: 20190416001259
Implantable Lead Implant Date: 20031031
Implantable Lead Location: 753860
Implantable Lead Model: 5076
Implantable Pulse Generator Implant Date: 20110310
Lead Channel Impedance Value: 408 Ohm
Lead Channel Impedance Value: 634 Ohm
Lead Channel Pacing Threshold Amplitude: 1 V
Lead Channel Pacing Threshold Pulse Width: 0.4 ms
Lead Channel Sensing Intrinsic Amplitude: 5.6 mV
Lead Channel Setting Pacing Amplitude: 2 V
Lead Channel Setting Sensing Sensitivity: 2.8 mV
MDC IDC LEAD IMPLANT DT: 19981106
MDC IDC LEAD LOCATION: 753859
MDC IDC MSMT BATTERY IMPEDANCE: 575 Ohm
MDC IDC MSMT BATTERY VOLTAGE: 2.79 V
MDC IDC MSMT LEADCHNL RA PACING THRESHOLD AMPLITUDE: 0.625 V
MDC IDC MSMT LEADCHNL RA PACING THRESHOLD PULSEWIDTH: 0.4 ms
MDC IDC MSMT LEADCHNL RA SENSING INTR AMPL: 1.4 mV
MDC IDC SET LEADCHNL RV PACING AMPLITUDE: 2.5 V
MDC IDC SET LEADCHNL RV PACING PULSEWIDTH: 0.4 ms
MDC IDC STAT BRADY AP VP PERCENT: 0 %

## 2017-05-20 ENCOUNTER — Other Ambulatory Visit: Payer: Self-pay | Admitting: Cardiovascular Disease

## 2017-05-21 ENCOUNTER — Other Ambulatory Visit: Payer: Self-pay | Admitting: Pulmonary Disease

## 2017-05-21 DIAGNOSIS — E039 Hypothyroidism, unspecified: Secondary | ICD-10-CM

## 2017-05-30 ENCOUNTER — Ambulatory Visit (HOSPITAL_COMMUNITY)
Admission: RE | Admit: 2017-05-30 | Discharge: 2017-05-30 | Disposition: A | Discharge status: Home / Self Care | Payer: 59 | Source: Ambulatory Visit | Attending: Neurology | Admitting: Neurology

## 2017-05-30 ENCOUNTER — Telehealth: Payer: Self-pay | Admitting: Neurology

## 2017-05-30 DIAGNOSIS — G459 Transient cerebral ischemic attack, unspecified: Secondary | ICD-10-CM | POA: Insufficient documentation

## 2017-05-30 DIAGNOSIS — Z950 Presence of cardiac pacemaker: Secondary | ICD-10-CM | POA: Diagnosis not present

## 2017-05-30 NOTE — Telephone Encounter (Signed)
  I called the patient.  The carotid Doppler study is unremarkable, she will will be seen in revisit on 30 May.   Carotid doppler 05/30/17:  Final Interpretation: Right Carotid: The extracranial vessels were near-normal with only minimal wall        thickening or plaque.  Left Carotid: The extracranial vessels were near-normal with only minimal wall       thickening or plaque.  Vertebrals: Bilateral vertebral arteries demonstrate antegrade flow. Subclavians: Normal flow hemodynamics were seen in bilateral subclavian       arteries.

## 2017-05-30 NOTE — Progress Notes (Signed)
VASCULAR LAB PRELIMINARY  PRELIMINARY  PRELIMINARY  PRELIMINARY  Carotid duplex completed.    Preliminary report:  1-39% ICA plaquing. Vertebral artery flow is antegrade.   Amy Lane, RVT 05/30/2017, 10:10 AM

## 2017-06-19 ENCOUNTER — Emergency Department (HOSPITAL_COMMUNITY): Payer: 59

## 2017-06-19 ENCOUNTER — Inpatient Hospital Stay (HOSPITAL_COMMUNITY)
Admission: EM | Admit: 2017-06-19 | Discharge: 2017-06-20 | DRG: 092 | Disposition: A | Discharge status: Home / Self Care | Payer: 59 | Attending: Family Medicine | Admitting: Family Medicine

## 2017-06-19 ENCOUNTER — Other Ambulatory Visit: Payer: Self-pay

## 2017-06-19 ENCOUNTER — Encounter (HOSPITAL_COMMUNITY): Payer: Self-pay

## 2017-06-19 DIAGNOSIS — M797 Fibromyalgia: Secondary | ICD-10-CM | POA: Diagnosis present

## 2017-06-19 DIAGNOSIS — Q796 Ehlers-Danlos syndrome: Secondary | ICD-10-CM | POA: Diagnosis not present

## 2017-06-19 DIAGNOSIS — Z91040 Latex allergy status: Secondary | ICD-10-CM

## 2017-06-19 DIAGNOSIS — I4510 Unspecified right bundle-branch block: Secondary | ICD-10-CM | POA: Diagnosis present

## 2017-06-19 DIAGNOSIS — R824 Acetonuria: Secondary | ICD-10-CM | POA: Diagnosis present

## 2017-06-19 DIAGNOSIS — E7800 Pure hypercholesterolemia, unspecified: Secondary | ICD-10-CM | POA: Diagnosis present

## 2017-06-19 DIAGNOSIS — Z79899 Other long term (current) drug therapy: Secondary | ICD-10-CM

## 2017-06-19 DIAGNOSIS — I5030 Unspecified diastolic (congestive) heart failure: Secondary | ICD-10-CM | POA: Diagnosis present

## 2017-06-19 DIAGNOSIS — Z950 Presence of cardiac pacemaker: Secondary | ICD-10-CM

## 2017-06-19 DIAGNOSIS — E871 Hypo-osmolality and hyponatremia: Secondary | ICD-10-CM | POA: Diagnosis present

## 2017-06-19 DIAGNOSIS — E876 Hypokalemia: Secondary | ICD-10-CM | POA: Diagnosis present

## 2017-06-19 DIAGNOSIS — E860 Dehydration: Secondary | ICD-10-CM | POA: Diagnosis present

## 2017-06-19 DIAGNOSIS — R4182 Altered mental status, unspecified: Secondary | ICD-10-CM | POA: Diagnosis not present

## 2017-06-19 DIAGNOSIS — F329 Major depressive disorder, single episode, unspecified: Secondary | ICD-10-CM | POA: Diagnosis present

## 2017-06-19 DIAGNOSIS — Z86718 Personal history of other venous thrombosis and embolism: Secondary | ICD-10-CM | POA: Diagnosis not present

## 2017-06-19 DIAGNOSIS — Z820 Family history of epilepsy and other diseases of the nervous system: Secondary | ICD-10-CM

## 2017-06-19 DIAGNOSIS — Z7901 Long term (current) use of anticoagulants: Secondary | ICD-10-CM | POA: Diagnosis not present

## 2017-06-19 DIAGNOSIS — Z79891 Long term (current) use of opiate analgesic: Secondary | ICD-10-CM

## 2017-06-19 DIAGNOSIS — R410 Disorientation, unspecified: Secondary | ICD-10-CM | POA: Diagnosis present

## 2017-06-19 DIAGNOSIS — Z8042 Family history of malignant neoplasm of prostate: Secondary | ICD-10-CM | POA: Diagnosis not present

## 2017-06-19 DIAGNOSIS — R4701 Aphasia: Secondary | ICD-10-CM

## 2017-06-19 DIAGNOSIS — F0390 Unspecified dementia without behavioral disturbance: Secondary | ICD-10-CM | POA: Diagnosis present

## 2017-06-19 DIAGNOSIS — Z888 Allergy status to other drugs, medicaments and biological substances status: Secondary | ICD-10-CM | POA: Diagnosis not present

## 2017-06-19 DIAGNOSIS — E039 Hypothyroidism, unspecified: Secondary | ICD-10-CM | POA: Diagnosis present

## 2017-06-19 DIAGNOSIS — E785 Hyperlipidemia, unspecified: Secondary | ICD-10-CM | POA: Diagnosis present

## 2017-06-19 DIAGNOSIS — I058 Other rheumatic mitral valve diseases: Secondary | ICD-10-CM | POA: Diagnosis present

## 2017-06-19 DIAGNOSIS — E559 Vitamin D deficiency, unspecified: Secondary | ICD-10-CM | POA: Diagnosis present

## 2017-06-19 DIAGNOSIS — R130 Aphagia: Secondary | ICD-10-CM | POA: Diagnosis present

## 2017-06-19 DIAGNOSIS — Z801 Family history of malignant neoplasm of trachea, bronchus and lung: Secondary | ICD-10-CM

## 2017-06-19 DIAGNOSIS — G43109 Migraine with aura, not intractable, without status migrainosus: Secondary | ICD-10-CM | POA: Diagnosis not present

## 2017-06-19 DIAGNOSIS — G43909 Migraine, unspecified, not intractable, without status migrainosus: Secondary | ICD-10-CM | POA: Diagnosis present

## 2017-06-19 DIAGNOSIS — I4891 Unspecified atrial fibrillation: Secondary | ICD-10-CM | POA: Diagnosis present

## 2017-06-19 DIAGNOSIS — I69392 Facial weakness following cerebral infarction: Secondary | ICD-10-CM | POA: Diagnosis not present

## 2017-06-19 DIAGNOSIS — Z806 Family history of leukemia: Secondary | ICD-10-CM

## 2017-06-19 DIAGNOSIS — Z79890 Hormone replacement therapy (postmenopausal): Secondary | ICD-10-CM

## 2017-06-19 DIAGNOSIS — Z833 Family history of diabetes mellitus: Secondary | ICD-10-CM

## 2017-06-19 DIAGNOSIS — I4581 Long QT syndrome: Secondary | ICD-10-CM | POA: Diagnosis present

## 2017-06-19 DIAGNOSIS — Z91048 Other nonmedicinal substance allergy status: Secondary | ICD-10-CM

## 2017-06-19 DIAGNOSIS — R270 Ataxia, unspecified: Secondary | ICD-10-CM | POA: Diagnosis present

## 2017-06-19 DIAGNOSIS — Z8249 Family history of ischemic heart disease and other diseases of the circulatory system: Secondary | ICD-10-CM

## 2017-06-19 DIAGNOSIS — G9340 Encephalopathy, unspecified: Secondary | ICD-10-CM | POA: Diagnosis not present

## 2017-06-19 LAB — DIFFERENTIAL
ABS IMMATURE GRANULOCYTES: 0 10*3/uL (ref 0.0–0.1)
Basophils Absolute: 0 10*3/uL (ref 0.0–0.1)
Basophils Relative: 0 %
Eosinophils Absolute: 0.1 10*3/uL (ref 0.0–0.7)
Eosinophils Relative: 1 %
IMMATURE GRANULOCYTES: 0 %
LYMPHS ABS: 1.2 10*3/uL (ref 0.7–4.0)
LYMPHS PCT: 24 %
MONOS PCT: 14 %
Monocytes Absolute: 0.7 10*3/uL (ref 0.1–1.0)
NEUTROS ABS: 3.1 10*3/uL (ref 1.7–7.7)
Neutrophils Relative %: 61 %

## 2017-06-19 LAB — URINALYSIS, ROUTINE W REFLEX MICROSCOPIC
Bilirubin Urine: NEGATIVE
GLUCOSE, UA: NEGATIVE mg/dL
Hgb urine dipstick: NEGATIVE
KETONES UR: 20 mg/dL — AB
LEUKOCYTES UA: NEGATIVE
Nitrite: NEGATIVE
PROTEIN: NEGATIVE mg/dL
Specific Gravity, Urine: >1.046 — ABNORMAL HIGH (ref 1.005–1.030)
pH: 9 — ABNORMAL HIGH (ref 5.0–8.0)

## 2017-06-19 LAB — CBC
HEMATOCRIT: 35.2 % — AB (ref 36.0–46.0)
Hemoglobin: 11.9 g/dL — ABNORMAL LOW (ref 12.0–15.0)
MCH: 29.4 pg (ref 26.0–34.0)
MCHC: 33.8 g/dL (ref 30.0–36.0)
MCV: 86.9 fL (ref 78.0–100.0)
Platelets: 147 10*3/uL — ABNORMAL LOW (ref 150–400)
RBC: 4.05 MIL/uL (ref 3.87–5.11)
RDW: 12.3 % (ref 11.5–15.5)
WBC: 5 10*3/uL (ref 4.0–10.5)

## 2017-06-19 LAB — COMPREHENSIVE METABOLIC PANEL
ALBUMIN: 3.4 g/dL — AB (ref 3.5–5.0)
ALK PHOS: 53 U/L (ref 38–126)
ALT: 10 U/L — AB (ref 14–54)
AST: 21 U/L (ref 15–41)
Anion gap: 11 (ref 5–15)
BILIRUBIN TOTAL: 0.6 mg/dL (ref 0.3–1.2)
BUN: 12 mg/dL (ref 6–20)
CALCIUM: 8.8 mg/dL — AB (ref 8.9–10.3)
CO2: 20 mmol/L — ABNORMAL LOW (ref 22–32)
CREATININE: 0.77 mg/dL (ref 0.44–1.00)
Chloride: 97 mmol/L — ABNORMAL LOW (ref 101–111)
GFR calc Af Amer: >60 mL/min (ref >60)
GLUCOSE: 93 mg/dL (ref 65–99)
POTASSIUM: 3.2 mmol/L — AB (ref 3.5–5.1)
Sodium: 128 mmol/L — ABNORMAL LOW (ref 135–145)
TOTAL PROTEIN: 6.7 g/dL (ref 6.5–8.1)

## 2017-06-19 LAB — RAPID URINE DRUG SCREEN, HOSP PERFORMED
Amphetamines: NOT DETECTED
BARBITURATES: NOT DETECTED
Benzodiazepines: NOT DETECTED
COCAINE: NOT DETECTED
Opiates: NOT DETECTED
TETRAHYDROCANNABINOL: NOT DETECTED

## 2017-06-19 LAB — I-STAT TROPONIN, ED: TROPONIN I, POC: 0.02 ng/mL (ref 0.00–0.08)

## 2017-06-19 LAB — I-STAT CHEM 8, ED
BUN: 12 mg/dL (ref 6–20)
CALCIUM ION: 1.07 mmol/L — AB (ref 1.15–1.40)
Chloride: 98 mmol/L — ABNORMAL LOW (ref 101–111)
Creatinine, Ser: 0.7 mg/dL (ref 0.44–1.00)
GLUCOSE: 93 mg/dL (ref 65–99)
HCT: 36 % (ref 36.0–46.0)
Hemoglobin: 12.2 g/dL (ref 12.0–15.0)
Potassium: 3.5 mmol/L (ref 3.5–5.1)
SODIUM: 132 mmol/L — AB (ref 135–145)
TCO2: 22 mmol/L (ref 22–32)

## 2017-06-19 LAB — ETHANOL

## 2017-06-19 LAB — PROTIME-INR
INR: 1.11
PROTHROMBIN TIME: 14.2 s (ref 11.4–15.2)

## 2017-06-19 LAB — APTT: aPTT: 33 seconds (ref 24–36)

## 2017-06-19 MED ORDER — APIXABAN 5 MG PO TABS
5.0000 mg | ORAL_TABLET | Freq: Two times a day (BID) | ORAL | Status: DC
  Administered 2017-06-19 – 2017-06-20 (×2): 5 mg via ORAL
  Filled 2017-06-19 (×2): qty 1

## 2017-06-19 MED ORDER — TRAMADOL HCL 50 MG PO TABS: 50.0000 mg | ORAL_TABLET | Freq: Three times a day (TID) | ORAL | Status: DC | PRN

## 2017-06-19 MED ORDER — KETOROLAC TROMETHAMINE 30 MG/ML IJ SOLN
15.0000 mg | Freq: Once | INTRAMUSCULAR | Status: AC
  Administered 2017-06-19: 15 mg via INTRAVENOUS
  Filled 2017-06-19: qty 1

## 2017-06-19 MED ORDER — DIPHENHYDRAMINE HCL 50 MG/ML IJ SOLN
12.5000 mg | Freq: Once | INTRAMUSCULAR | Status: AC
  Administered 2017-06-19: 12.5 mg via INTRAVENOUS
  Filled 2017-06-19: qty 1

## 2017-06-19 MED ORDER — SODIUM CHLORIDE 0.9 % IV BOLUS
500.0000 mL | Freq: Once | INTRAVENOUS | Status: AC
  Administered 2017-06-19: 500 mL via INTRAVENOUS

## 2017-06-19 MED ORDER — LEVOTHYROXINE SODIUM 88 MCG PO TABS
88.0000 ug | ORAL_TABLET | Freq: Every day | ORAL | Status: DC
  Administered 2017-06-20: 88 ug via ORAL
  Filled 2017-06-19 (×2): qty 1

## 2017-06-19 MED ORDER — ACETAMINOPHEN 650 MG RE SUPP: 650.0000 mg | Freq: Four times a day (QID) | RECTAL | Status: DC | PRN

## 2017-06-19 MED ORDER — EZETIMIBE 10 MG PO TABS
10.0000 mg | ORAL_TABLET | Freq: Every day | ORAL | Status: DC
  Filled 2017-06-19: qty 1

## 2017-06-19 MED ORDER — ACETAMINOPHEN 325 MG PO TABS: 650.0000 mg | ORAL_TABLET | Freq: Four times a day (QID) | ORAL | Status: DC | PRN

## 2017-06-19 MED ORDER — PROCHLORPERAZINE EDISYLATE 10 MG/2ML IJ SOLN
5.0000 mg | Freq: Once | INTRAMUSCULAR | Status: AC
  Administered 2017-06-19: 5 mg via INTRAVENOUS
  Filled 2017-06-19: qty 2

## 2017-06-19 MED ORDER — SODIUM CHLORIDE 0.9 % IV SOLN
100.0000 mL/h | INTRAVENOUS | Status: DC
  Administered 2017-06-19 – 2017-06-20 (×2): 100 mL/h via INTRAVENOUS

## 2017-06-19 MED ORDER — ACEBUTOLOL HCL 200 MG PO CAPS
200.0000 mg | ORAL_CAPSULE | Freq: Every day | ORAL | Status: DC
  Administered 2017-06-19: 200 mg via ORAL
  Filled 2017-06-19: qty 1

## 2017-06-19 MED ORDER — IOPAMIDOL (ISOVUE-370) INJECTION 76%
100.0000 mL | Freq: Once | INTRAVENOUS | Status: AC | PRN
  Administered 2017-06-19: 100 mL via INTRAVENOUS

## 2017-06-19 NOTE — Consult Note (Signed)
Neurology Consultation Reason for Consult: Difficulty speaking Referring Physician: Demaris Callander  CC: Difficulty speaking  History is obtained from: Patient, husband  HPI: Amy Lane is a 67 y.o. female with a history of atrial fibrillation on Eliquis who presents with aphasia.  She was in her normal state of health when she went to the gym around 330 this afternoon, she did not talk to anybody at the gym, but when she tried to talk to her husband around 2 she noticed that she was having difficulty.  Due to difficulty with speech, her husband called 911 and she was brought to the emergency department.  On evaluation in the emergency department, she had an odd affect with inconsistent aphasia.  She was taken for stat CT perfusion, and improved while in the CT scanner.  On return to her room, she had a transient worsening soon as she returned to her room but then improved again.  She endorses mild headache, as well as mild photophobia.  She gets migraines not infrequently, and states that it is common for her to have some confusion with her migraines but that this is worse than typical.  She had a similar episode about a month ago, also associated with headache and there was a similar episode in 2015 also associated with headache.   LKW: 3:30 PM tpa given?: no, improving symptoms, mild symptoms    ROS: A 14 point ROS was performed and is negative except as noted in the HPI.   Past Medical History:  Diagnosis Date  . Abnormal gait   . Allergic rhinitis   . Atrial fibrillation ([place])   . Carcinomas, basal cell   . Cardiac pacemaker    DDD  MDT  . Cerebrovascular disease   . Depression   . Dysautonomia ([place])   . Ehler's-Danlos syndrome   . Fibromyalgia   . Headache(784.0)    Migraine  . Hemorrhoids   . Herpes zoster   . History of syncope   . History of tachycardia-bradycardia syndrome   . History of transient ischemic attack   . Hypercholesteremia   . Hypercholesterolemia    . Hypothyroidism   . Long term (current) use of anticoagulants   . Mitral valve prolapse   . PAT (paroxysmal atrial tachycardia) ([place])   . PSVT (paroxysmal supraventricular tachycardia) ([place])   . Skin desquamation    inflammative vaginitis  . SVT (supraventricular tachycardia) ([facility])      Family History  Problem Relation Age of Onset  . Lung cancer Mother   . Prostate cancer Father   . Alzheimer's disease Father   . Heart disease Father   . Ehlers-Danlos syndrome Father   . Mitral valve prolapse Brother   . Diabetes Sister   . Mitral valve prolapse Sister   . Leukemia Other        Nephew  . Heart disease Unknown        entire maternal side of family  . Cancer Unknown        entire maternal side of family     Social History:  reports that she has never smoked. She has never used smokeless tobacco. She reports that she does not drink alcohol or use drugs.   Exam: Current vital signs: BP 132/63   Pulse 74   Temp 98.3 F (36.8 C)   Resp 16   SpO2 100%  Vital signs in last 24 hours: Temp:  [98.3 F (36.8 C)] 98.3 F (36.8 C) (05/28 1932) Pulse Rate:  [  59-74] 74 (05/28 2015) Resp:  [10-16] 16 (05/28 2015) BP: (127-135)/(60-71) 132/63 (05/28 2015) SpO2:  [100 %] 100 % (05/28 2015)   Physical Exam  Constitutional: Appears well-developed and well-nourished.  Psych: Affect appropriate to situation Eyes: No scleral injection HENT: No OP obstrucion Head: Normocephalic.  Cardiovascular: Normal rate and regular rhythm.  Respiratory: Effort normal, non-labored breathing GI: Soft.  No distension. There is no tenderness.  Skin: WDI  Neuro: Mental Status: Patient is awake, alert, initially with severe expressive aphasia, which subsequently improved. Cranial Nerves: II: Visual Fields are full. Pupils are equal, round, and reactive to light.   III,IV, VI: EOMI without ptosis or diploplia.  V: Facial sensation is symmetric to temperature VII: Facial movement is  symmetric.  VIII: hearing is intact to voice X: Uvula elevates symmetrically XI: Shoulder shrug is symmetric. XII: tongue is midline without atrophy or fasciculations.  Motor: Tone is normal. Bulk is normal. 5/5 strength was present in all four extremities.  Sensory: Sensation is symmetric to light touch and temperature in the arms and legs. Deep Tendon Reflexes: 2+ and symmetric in the biceps and patellae.  Plantars: Toes are downgoing bilaterally.  Cerebellar: FNF and HKS are intact bilaterally   I have reviewed labs in epic and the results pertinent to this consultation are: Mildly low sodium at 132  I have reviewed the images obtained: CT A/P- negative  Impression: 67 year old female with recurrent stereotyped episodes of confusion/aphasia associated with headache.  Given the multiple stereotyped episodes without vascular stenosis, my suspicion is that this represents complicated migraine.  There could also be a possibility of some psychological overlay, but this is not definite.   Though there is some possibility could represent TIA, I think that this is less likely and in any case, she is already on anticoagulation and vascular imaging has already been performed. Therefore I am not sure she would get significant benefit from admission for further work-up.  Recommendations: 1) treat with migraine cocktail 2) no further recommendations unless symptoms recur.   Roland Rack, MD [facility] [phone]  If 7pm- 7am, please page neurology on call as listed in [place].

## 2017-06-19 NOTE — ED Notes (Signed)
[facility] called @ 1835-per Harris, PA-called by Levada Dy

## 2017-06-19 NOTE — ED Notes (Signed)
Patient transported to CT 

## 2017-06-19 NOTE — ED Provider Notes (Addendum)
[facility] EMERGENCY DEPARTMENT Provider Note   CSN: [id number] Arrival date & time: 06/19/17  1823     History   Chief Complaint Chief Complaint  Patient presents with  . Altered Mental Status    HPI Amy Lane is a 67 y.o. female who presents the emergency department chief complaint of confusion and speech abnormality.  She has an extensive past medical history including previous TIAs, Ehlers-Danlos syndrome, fibromyalgia.  History is given by her husband who is at bedside.  He states that she was totally normal at 10 AM this morning.  She called him on the phone at 4:30 PM and stated that she started having symptoms about an hour prior to the call that included confusion, dizziness.  He states that she seemed to have slurred speech and her speech was slow.  She is complaining of flashing light and ataxia as well.  He states that she seems to have worsened since they called 911 and become more confused.  Since she got here her speech is now a phasic and she intermittently speaks in [place].   HPI  Past Medical History:  Diagnosis Date  . Abnormal gait   . Allergic rhinitis   . Atrial fibrillation ([place])   . Carcinomas, basal cell   . Cardiac pacemaker    DDD  MDT  . Cerebrovascular disease   . Depression   . Dysautonomia ([place])   . Ehler's-Danlos syndrome   . Fibromyalgia   . Headache(784.0)    Migraine  . Hemorrhoids   . Herpes zoster   . History of syncope   . History of tachycardia-bradycardia syndrome   . History of transient ischemic attack   . Hypercholesteremia   . Hypercholesterolemia   . Hypothyroidism   . Long term (current) use of anticoagulants   . Mitral valve prolapse   . PAT (paroxysmal atrial tachycardia) ([place])   . PSVT (paroxysmal supraventricular tachycardia) ([place])   . Skin desquamation    inflammative vaginitis  . SVT (supraventricular tachycardia) [facility])     Patient Active Problem List   Diagnosis Date Noted  . S/P  mitral valve repair 12/28/2016  . Dysthymia 12/28/2016  . SVT (supraventricular tachycardia) ([place])   . Skin desquamation   . PSVT (paroxysmal supraventricular tachycardia) ([place])   . PAT (paroxysmal atrial tachycardia) ([place])   . Mitral valve prolapse   . Long term (current) use of anticoagulants   . Hypercholesterolemia   . Hypercholesteremia   . History of transient ischemic attack   . History of tachycardia-bradycardia syndrome   . History of syncope   . Fibromyalgia   . Cardiac pacemaker   . Carcinomas, basal cell   . Abnormal gait   . Vitamin D deficiency 07/26/2016  . History of DVT (deep vein thrombosis) 05/31/2015  . Bilateral hand pain 04/06/2014  . Memory deficits 03/04/2013  . Mental status change 01/28/2013  . Constipation 04/22/2012  . Aphasia 08/03/2011  . Muscle weakness (generalized) 08/03/2011  . Abnormality of gait 08/03/2011  . Migraine without aura 08/03/2011  . Encounter for therapeutic drug monitoring 08/03/2011  . Alopecia 03/09/2011  . TMJ syndrome 10/27/2010  . CARDIAC PACEMAKER DDD MDT 03/29/2010  . SVT/ PSVT/ PAT 07/28/2008  . ATRIAL FIBRILLATION 06/11/2008  . BRADYCARDIA-TACHYCARDIA SYNDROME 05/16/2007  . Transient cerebral ischemia 05/16/2007  . EHLERS-DANLOS SYNDROME 05/16/2007  . SYNCOPE 05/16/2007  . HYPERCHOLESTEROLEMIA 05/15/2007  . Cerebrovascular disease 05/15/2007  . Hypothyroidism 01/28/2007  . Depression 01/28/2007  . Mitral valve  disorder 01/28/2007  . FIBROMYALGIA 01/28/2007  . Dysautonomia ([place]) 01/28/2007    Past Surgical History:  Procedure Laterality Date  . CAROTID ENDARTERECTOMY    . complex mitral valve repair     at the [facility]  . INSERT / REPLACE / REMOVE PACEMAKER    . left carotid endarterectomy  11/2003   by Dr. Delton See  . PACEMAKER INSERTION     medtronic kappa 901  . removed chalazion from left eye lid  09/2011   Dr. Satira Sark     OB History   None      Home Medications    Prior to Admission  medications   Medication Sig Start Date End Date Taking? Authorizing Provider  acebutolol (SECTRAL) 200 MG capsule TAKE 1 CAPSULE BY MOUTH EVERYDAY AT BEDTIME 05/22/17   Josue Hector, MD  Azelastine-Fluticasone [facility]) 137-50 MCG/ACT SUSP 1-2 sprays in each nostril BID as needed 06/12/16   Noralee Space, MD  citalopram (CELEXA) 20 MG tablet Take 1 tablet (20 mg total) by mouth daily. 03/20/17   Noralee Space, MD  donepezil (ARICEPT) 5 MG tablet Take 1 tablet (5 mg total) by mouth at bedtime. 06/20/16   Kathrynn Ducking, MD  doxycycline (VIBRA-TABS) 100 MG tablet 2 tabs today, then 1 tab daily until gone. 01/19/17   Deneise Lever, MD  ELIQUIS 5 MG TABS tablet TAKE 1 TABLET TWICE A DAY 08/22/16   Josue Hector, MD  ezetimibe (ZETIA) 10 MG tablet TAKE 1 TABLET BY MOUTH EVERY DAY 02/19/17   Noralee Space, MD  ipratropium (ATROVENT) 0.06 % nasal spray Place 2 sprays into both nostrils 2 (two) times daily. 04/11/17   Noralee Space, MD  levothyroxine (SYNTHROID, LEVOTHROID) 88 MCG tablet TAKE 1 TABLET (88 MCG TOTAL) BY MOUTH DAILY BEFORE BREAKFAST. 05/22/17   Noralee Space, MD  linaclotide Rolan Lipa) 72 MCG capsule Take 1 capsule (72 mcg total) by mouth daily before breakfast. Patient taking differently: Take 72 mcg by mouth daily before breakfast. Pt takes 1 tablet 3 times weekly 06/16/15   Milus Banister, MD  LORazepam (ATIVAN) 1 MG tablet TAKE 1 TABLET BY MOUTH 3 TIMES A DAY AS NEEDED 05/22/17   Noralee Space, MD  Probiotic Product (ALIGN) 4 MG CAPS Take 4 mg by mouth every morning.    [provider]  traMADol (ULTRAM) 50 MG tablet Take 1 tablet (50 mg total) by mouth 3 (three) times daily as needed for moderate pain. 11/30/14   Noralee Space, MD    Family History Family History  Problem Relation Age of Onset  . Lung cancer Mother   . Prostate cancer Father   . Alzheimer's disease Father   . Heart disease Father   . Ehlers-Danlos syndrome Father   . Mitral valve prolapse  Brother   . Diabetes Sister   . Mitral valve prolapse Sister   . Leukemia Other        Nephew  . Heart disease Unknown        entire maternal side of family  . Cancer Unknown        entire maternal side of family    Social History Social History   Tobacco Use  . Smoking status: Never Smoker  . Smokeless tobacco: Never Used  . Tobacco comment: smoked in her early 20's--smoked 4 cigs per year   Substance Use Topics  . Alcohol use: No    Alcohol/week: 0.0 oz  Comment: 2 drinks per year  . Drug use: No     Allergies   Other and Statins   Review of Systems Review of Systems  Ten systems reviewed and are negative for acute change, except as noted in the HPI.   Physical Exam Updated Vital Signs There were no vitals taken for this visit.  Physical Exam  Constitutional: She appears well-developed and well-nourished. No distress.  HENT:  Head: Normocephalic and atraumatic.  Eyes: Pupils are equal, round, and reactive to light. Conjunctivae are normal. No scleral icterus.  Neck: Normal range of motion.  Cardiovascular: Normal rate, regular rhythm and normal heart sounds. Exam reveals no gallop and no friction rub.  No murmur heard. Pulmonary/Chest: Effort normal and breath sounds normal. No respiratory distress.  Abdominal: Soft. Bowel sounds are normal. She exhibits no distension and no mass. There is no tenderness. There is no guarding.  Neurological: She is alert.  Patient unable to follow two-step commands. Verbal responses are gibberish Eyes track together.  Unable to complete neurologic exam secondary to inability to follow commands.  Does not appear to have arm drift.  Skin: Skin is warm and dry. She is not diaphoretic.  Psychiatric: Her behavior is normal.  Nursing note and vitals reviewed.    ED Treatments / Results  Labs (all labs ordered are listed, but only abnormal results are displayed) Labs Reviewed  ETHANOL  PROTIME-INR  APTT  CBC    DIFFERENTIAL  COMPREHENSIVE METABOLIC PANEL  RAPID URINE DRUG SCREEN, HOSP PERFORMED  URINALYSIS, ROUTINE W REFLEX MICROSCOPIC  I-STAT CHEM 8, ED  I-STAT TROPONIN, ED    EKG EKG Interpretation  Date/Time:  Tuesday Jun 19 2017 18:26:29 EDT Ventricular Rate:  65 PR Interval:    QRS Duration: 146 QT Interval:  477 QTC Calculation: 496 R Axis:   118 Text Interpretation:  Atrial fibrillation RBBB and LPFB Confirmed by Dene Gentry [phone]) on 06/19/2017 6:38:37 PM   Patient does not appear to have A. fib only baseline wander.  P waves are obviously present with regular rate and rhythm.  Radiology No results found.  Procedures .Critical Care Performed by: Margarita Mail, PA-C Authorized by: Margarita Mail, PA-C   Critical care provider statement:    Critical care time (minutes):  30   Critical care was necessary to treat or prevent imminent or life-threatening deterioration of the following conditions:  CNS failure or compromise   Critical care was time spent personally by me on the following activities:  Development of treatment plan with patient or surrogate, discussions with consultants, evaluation of patient's response to treatment, examination of patient, ordering and performing treatments and interventions, ordering and review of laboratory studies, ordering and review of radiographic studies, pulse oximetry, re-evaluation of patient's condition and review of old charts   (including critical care time)  Medications Ordered in ED Medications  sodium chloride 0.9 % bolus 500 mL (has no administration in time range)    Followed by  0.9 %  sodium chloride infusion (has no administration in time range)     Initial Impression / Assessment and Plan / ED Course  I have reviewed the triage vital signs and the nursing notes.  Pertinent labs & imaging results that were available during my care of the patient were reviewed by me and considered in my medical decision making  (see chart for details).  Clinical Course as of Jun 19 2113  Tue Jun 19, 2017  2036 Sodium(!): 128 [AH]  2037 Potassium(!): 3.2 [AH]  2038 Patient with low sodium.  This appears to be new in her history.  Patient is on Celexa but does not appear to be on high-dose SSRIs.  Patient cannot have MRI as she has a pacemaker in place.  She has been seen by the neurology service who feels this is likely complicated migraine and asked that we treat as such.   [AH]    Clinical Course User Index [AH] Margarita Mail, PA-C    Patient with continued confusion intermittent gibberish aphasia.  Patient will be admitted to the family medicine service.  She is a confused with hyponatremia.  She has not improved with treatment of migraine.  She is already been seen by neurology does not feel that this is secondary to stroke.  Final Clinical Impressions(s) / ED Diagnoses   Final diagnoses:  Altered mental status, unspecified altered mental status type  Hyponatremia  Gibberish aphasia    ED Discharge Orders    None       Margarita Mail, PA-C 06/19/17 2228    Valarie Merino, MD 06/19/17 2250    Margarita Mail, PA-C 07/10/17 1635    Valarie Merino, MD 07/10/17 [phone]

## 2017-06-19 NOTE — ED Notes (Signed)
Patient able to talk in complete sentences at this time.  Improvement from initial assessment.  MD aware of change.

## 2017-06-19 NOTE — H&P (Addendum)
[facility] Admission History and Physical Service Pager: [phone]  Patient name: Amy Lane Medical record number: [id number] Date of birth: May 17, 1950 Age: 67 y.o. Gender: female  Primary Care Provider: Noralee Space, MD Consultants: none Code Status: Full (per husband)  Chief Complaint: intermittent aphagia, ams  Assessment and Plan: [name] is a 67 y.o. female presenting with  intermittent aphagia, ams. PMH is significant for hx AFib w/ ablation (on Eliquis), Ehlers-Danlos, MVP s/p surgical correction at [facility] clinic x3, h/o PSVT, h/o sick sinus syndrome s/p pacemaker placement, depression, HLD, migraines, h/o TIA, prolonged Qt syndrome.   Intermittent aphagia  AMS: TIA vs complicated migraines vs psychosocial component. Patient still with some slurring on exam and some intermittent aphagia. CT and CTA negative for signs of stroke. Patient with history of similar presentation due to migraines in 2015. No improvement with migraine cocktail.  However reports that her migraines have mostly resolved.  Patient also with history of CVA in the 90s and is currently anticoagulated on Eliquis.  Neurology consulted in ED who believe that patient's intermittent a aphasia is not secondary to stroke.  Patient unable to have MRI of head due to pacemaker.  Husband does admit that patient has trouble with short-term memory and has small facial droop from previous stroke.  Patient also with history of depression. Also has mild hyponatremia, which could be contributing to AMS, but less likely. UA with mild ketonuria and increased gravity concerning for dehydration. Will admit to monitor for improvement.  No other focal findings on exam. - Admit to telemetry, attending Dr. Andria Frames - Neuro checks every 2 hours - Neurology consulted in ED and will follow if symptoms continue - Diet pending bedside swallow - s/p migraine cocktail  - Routine vitals per unit -  Holding home donepezil - TSH, A1c - PT/OT eval and treat  Hyponatremia  Hypokalemia: Na 132 on admission, K3.2. S/p 561mL bolus with 100 mL/hr of NS with repeat Na of 128. Given altered mental status and unclear etiology of hyponatremia with admit and monitor.  Patient currently on Celexa which could cause SIADH.  Will obtain urine studies for FENa.  Husband does report decreased intake over the past 4 days and patient appears dry on exam.  Husband denies any vomiting. - Monitor on repeat BMP - Urine osmolality and urine sodium pending - Replete K with 40 meq of K-Dur - Holding home Celexa and donepezil  Atrial Fibrillation without RVR on Eliquis:  - Continue eliquis - Continuous cardiac monitoring  HFpEF: stable. Patient without cardiopulmonary symptoms. Echo in 03/2016 with EF of 60-65%, G2DD -Continue monitoring resp status while on fluid  Ehlers-Danlos  MVP s/p surgical correction: -Continuous cardiac monitoring  H/o TIA's and CVA: -Neurology has seen patient in ED and will follow if symptoms do not improve -Continue Eliquis and Zetia  Depression: Patient on Celexa at home -Holding Celexa in setting of hyponatremia  ?Dementia: she is on Donepezil -will hold Donepezil  FEN/GI: NPO pending bedside swallow Prophylaxis: Eliquis  Disposition: admit to telemetry  History of Present Illness:  Amy Lane is a 67 y.o. female presenting with slurring of speech. Today about 4 pm husband reports that patient was complaining about memory issue, confusion, flashing of light, and feeling off balance. He also reports some "slurred" speech. He reports that this has worsened sice coming in. [facility] got [facility] from [facility] about 4:30 and called her husband. Husband called 911 about 5 pm. He reports that  she has had intermittent speech changes. She does have a history of stroke in 1990's which did not lead to any permanent weakness other than some facial drooping.   Husband reports that she has  been eating and drinking less in the past 4 days. He denies any sick contacts.    Patient unable to provide much history or ROS for [place].  Review Of Systems: Per HPI. Unable to obtain form patient.   ROS  Patient Active Problem List   Diagnosis Date Noted  . Hyponatremia 06/19/2017  . S/P mitral valve repair 12/28/2016  . Dysthymia 12/28/2016  . SVT (supraventricular tachycardia) ([place])   . Skin desquamation   . PSVT (paroxysmal supraventricular tachycardia) ([place])   . PAT (paroxysmal atrial tachycardia) ([place])   . Mitral valve prolapse   . Long term (current) use of anticoagulants   . Hypercholesterolemia   . Hypercholesteremia   . History of transient ischemic attack   . History of tachycardia-bradycardia syndrome   . History of syncope   . Fibromyalgia   . Cardiac pacemaker   . Carcinomas, basal cell   . Abnormal gait   . Vitamin D deficiency 07/26/2016  . History of DVT (deep vein thrombosis) 05/31/2015  . Bilateral hand pain 04/06/2014  . Memory deficits 03/04/2013  . Mental status change 01/28/2013  . Constipation 04/22/2012  . Aphasia 08/03/2011  . Muscle weakness (generalized) 08/03/2011  . Abnormality of gait 08/03/2011  . Migraine without aura 08/03/2011  . Encounter for therapeutic drug monitoring 08/03/2011  . Alopecia 03/09/2011  . TMJ syndrome 10/27/2010  . CARDIAC PACEMAKER DDD MDT 03/29/2010  . SVT/ PSVT/ PAT 07/28/2008  . ATRIAL FIBRILLATION 06/11/2008  . BRADYCARDIA-TACHYCARDIA SYNDROME 05/16/2007  . Transient cerebral ischemia 05/16/2007  . EHLERS-DANLOS SYNDROME 05/16/2007  . SYNCOPE 05/16/2007  . HYPERCHOLESTEROLEMIA 05/15/2007  . Cerebrovascular disease 05/15/2007  . Hypothyroidism 01/28/2007  . Depression 01/28/2007  . Mitral valve disorder 01/28/2007  . FIBROMYALGIA 01/28/2007  . Dysautonomia ([place]) 01/28/2007    Past Medical History: Past Medical History:  Diagnosis Date  . Abnormal gait   . Allergic rhinitis   . Atrial fibrillation  ([place])   . Carcinomas, basal cell   . Cardiac pacemaker    DDD  MDT  . Cerebrovascular disease   . Depression   . Dysautonomia ([place])   . Ehler's-Danlos syndrome   . Fibromyalgia   . Headache(784.0)    Migraine  . Hemorrhoids   . Herpes zoster   . History of syncope   . History of tachycardia-bradycardia syndrome   . History of transient ischemic attack   . Hypercholesteremia   . Hypercholesterolemia   . Hypothyroidism   . Long term (current) use of anticoagulants   . Mitral valve prolapse   . PAT (paroxysmal atrial tachycardia) ([place])   . PSVT (paroxysmal supraventricular tachycardia) ([place])   . Skin desquamation    inflammative vaginitis  . SVT (supraventricular tachycardia) ([facility])     Past Surgical History: Past Surgical History:  Procedure Laterality Date  . CAROTID ENDARTERECTOMY    . complex mitral valve repair     at the [facility]  . INSERT / REPLACE / REMOVE PACEMAKER    . left carotid endarterectomy  11/2003   by Dr. Delton See  . PACEMAKER INSERTION     medtronic kappa 901  . removed chalazion from left eye lid  09/2011   Dr. Satira Sark    Social History: Social History   Tobacco Use  . Smoking status:  Never Smoker  . Smokeless tobacco: Never Used  . Tobacco comment: smoked in her early 20's--smoked 4 cigs per year   Substance Use Topics  . Alcohol use: No    Alcohol/week: 0.0 oz    Comment: 2 drinks per year  . Drug use: No   Additional social history: Husband reports that she does not drink or smoke.   Please also refer to relevant sections of EMR.  Family History: Family History  Problem Relation Age of Onset  . Lung cancer Mother   . Prostate cancer Father   . Alzheimer's disease Father   . Heart disease Father   . Ehlers-Danlos syndrome Father   . Mitral valve prolapse Brother   . Diabetes Sister   . Mitral valve prolapse Sister   . Leukemia Other        Nephew  . Heart disease Unknown        entire maternal side of family  . Cancer  Unknown        entire maternal side of family    Allergies and Medications: Allergies  Allergen Reactions  . Other Other (See Comments)    All QTc prolongation drugs  . Statins Other (See Comments)    Muscle aches  . Adhesive [Tape] Rash  . Latex Rash   No current facility-administered medications on file prior to encounter.    Current Outpatient Medications on File Prior to Encounter  Medication Sig Dispense Refill  . acebutolol (SECTRAL) 200 MG capsule TAKE 1 CAPSULE BY MOUTH EVERYDAY AT BEDTIME 90 capsule 2  . Azelastine-Fluticasone (DYMISTA) 137-50 MCG/ACT SUSP 1-2 sprays in each nostril BID as needed (Patient taking differently: Place 1-2 sprays into both nostrils 2 (two) times daily as needed (for congestion or allergies). ) 23 g 3  . citalopram (CELEXA) 20 MG tablet Take 1 tablet (20 mg total) by mouth daily. 90 tablet 3  . donepezil (ARICEPT) 5 MG tablet Take 1 tablet (5 mg total) by mouth at bedtime. 90 tablet 3  . ELIQUIS 5 MG TABS tablet TAKE 1 TABLET TWICE A DAY 180 tablet 3  . ezetimibe (ZETIA) 10 MG tablet TAKE 1 TABLET BY MOUTH EVERY DAY 90 tablet 1  . ipratropium (ATROVENT) 0.06 % nasal spray Place 2 sprays into both nostrils 2 (two) times daily. 13 mL 2  . levothyroxine (SYNTHROID, LEVOTHROID) 88 MCG tablet TAKE 1 TABLET (88 MCG TOTAL) BY MOUTH DAILY BEFORE BREAKFAST. 30 tablet 2  . linaclotide (LINZESS) 72 MCG capsule Take 1 capsule (72 mcg total) by mouth daily before breakfast. 30 capsule 11  . LORazepam (ATIVAN) 1 MG tablet TAKE 1 TABLET BY MOUTH 3 TIMES A DAY AS NEEDED (Patient taking differently: Take 1 mg by mouth three times a day as needed for anxiety) 90 tablet 0  . traMADol (ULTRAM) 50 MG tablet Take 1 tablet (50 mg total) by mouth 3 (three) times daily as needed for moderate pain. 50 tablet 0  . doxycycline (VIBRA-TABS) 100 MG tablet 2 tabs today, then 1 tab daily until gone. (Patient not taking: Reported on 06/19/2017) 8 tablet 0    Objective: BP 129/63    Pulse 64   Temp 98.3 F (36.8 C)   Resp 15   SpO2 95%  Exam: General: NAD Eyes: PERRL, EOMI, no conjunctival pallor or injection, photophobia ENTM: Moist mucous membranes, no pharyngeal erythema or exudate Neck: Supple, no LAD Cardiovascular: RRR, no m/r/g, no LE edema Respiratory: CTA BL, normal work of breathing Gastrointestinal: soft, normoactive  BS GU: no CVA tenderness MSK: moves 4 extremities equally Derm: no rashes appreciated Neuro: PERRL, no facial dropping, BLUE and BLLE with 5/5 strength, patient able to follow commands for short time- unable to complete neurologic exam secondary to inability to follow commands Psych: A&O to self and place, not time  Labs and Imaging: CBC BMET  Recent Labs  Lab 06/19/17 1930  WBC 5.0  HGB 11.9*  HCT 35.2*  PLT 147*   Recent Labs  Lab 06/19/17 1930  NA 128*  K 3.2*  CL 97*  CO2 20*  BUN 12  CREATININE 0.77  GLUCOSE 93  CALCIUM 8.8*     Troponin 0.02 UDS negative UA with 20 ketones, pH 9.0, specific gravity 1.046  Ct Angio Head W Or Wo Contrast IMPRESSION: 1. Negative for large vessel occlusion. And negative for arterial stenosis in the head or neck. 2. No core infarct or ischemia detected by CT perfusion. 3. Proximal right ICA atherosclerosis. Mild bilateral ICA siphon dolichoectasia. 4. Left cardiac pacemaker.  Prior sternotomy.  Ct Head Code Stroke Wo Contrast IMPRESSION: 1. Stable and normal noncontrast CT appearance of the brain. 2. ASPECTS is 10.   Shirley, [place], DO 06/19/2017, 10:09 PM PGY-1, [facility] pager: [phone], text pages welcome  I have seen and evaluated the patient with Dr. Melvia Heaps. I am in agreement with the note above in its revised form. My additions are in red.  Wendee Beavers, MD, PGY-3 06/20/2017 7:27 AM

## 2017-06-19 NOTE — ED Triage Notes (Signed)
Patient is here by EMS for new onset confusion.  Husband report leaving house at 11 with patient normal.  Then husband called patient at 1530 and noticed patient's speech was different and seemed to be very confused.  EMS called to house and reports patient becoming more and more confused on transport to ED.  History of CVA and pacemaker placement.  On Eliquis.  Had similar experience a few months ago but all symptoms resolved before getting to the hospital.    Vitals: 140/67, HR 88, SpO2 99%, 18RR, CBG 109.

## 2017-06-20 ENCOUNTER — Inpatient Hospital Stay (HOSPITAL_COMMUNITY): Payer: 59

## 2017-06-20 ENCOUNTER — Encounter (HOSPITAL_COMMUNITY): Payer: Self-pay | Admitting: Emergency Medicine

## 2017-06-20 DIAGNOSIS — G9340 Encephalopathy, unspecified: Secondary | ICD-10-CM

## 2017-06-20 DIAGNOSIS — R4182 Altered mental status, unspecified: Secondary | ICD-10-CM

## 2017-06-20 DIAGNOSIS — Q796 Ehlers-Danlos syndrome: Secondary | ICD-10-CM

## 2017-06-20 LAB — TSH: TSH: 0.177 u[IU]/mL — AB (ref 0.350–4.500)

## 2017-06-20 LAB — CBC
HCT: 33.2 % — ABNORMAL LOW (ref 36.0–46.0)
HEMOGLOBIN: 11.4 g/dL — AB (ref 12.0–15.0)
MCH: 30.5 pg (ref 26.0–34.0)
MCHC: 34.3 g/dL (ref 30.0–36.0)
MCV: 88.8 fL (ref 78.0–100.0)
PLATELETS: 155 10*3/uL (ref 150–400)
RBC: 3.74 MIL/uL — AB (ref 3.87–5.11)
RDW: 12.7 % (ref 11.5–15.5)
WBC: 8.3 10*3/uL (ref 4.0–10.5)

## 2017-06-20 LAB — HIV ANTIBODY (ROUTINE TESTING W REFLEX): HIV SCREEN 4TH GENERATION: NONREACTIVE

## 2017-06-20 LAB — BASIC METABOLIC PANEL
ANION GAP: 9 (ref 5–15)
BUN: 11 mg/dL (ref 6–20)
CALCIUM: 8.5 mg/dL — AB (ref 8.9–10.3)
CO2: 21 mmol/L — ABNORMAL LOW (ref 22–32)
CREATININE: 0.82 mg/dL (ref 0.44–1.00)
Chloride: 101 mmol/L (ref 101–111)
Glucose, Bld: 112 mg/dL — ABNORMAL HIGH (ref 65–99)
Potassium: 4.1 mmol/L (ref 3.5–5.1)
SODIUM: 131 mmol/L — AB (ref 135–145)

## 2017-06-20 LAB — T4, FREE: FREE T4: 1.22 ng/dL (ref 0.82–1.77)

## 2017-06-20 LAB — OSMOLALITY, URINE: Osmolality, Ur: 561 mOsm/kg (ref 300–900)

## 2017-06-20 LAB — HEMOGLOBIN A1C
HEMOGLOBIN A1C: 5.5 % (ref 4.8–5.6)
MEAN PLASMA GLUCOSE: 111.15 mg/dL

## 2017-06-20 LAB — SODIUM, URINE, RANDOM: Sodium, Ur: 60 mmol/L

## 2017-06-20 MED ORDER — POTASSIUM CHLORIDE CRYS ER 20 MEQ PO TBCR
40.0000 meq | EXTENDED_RELEASE_TABLET | Freq: Once | ORAL | Status: AC
  Administered 2017-06-20: 40 meq via ORAL
  Filled 2017-06-20: qty 2

## 2017-06-20 MED ORDER — MAGNESIUM SULFATE 2 GM/50ML IV SOLN: 2.0000 g | Freq: Once | INTRAVENOUS | Status: DC

## 2017-06-20 MED ORDER — MAGNESIUM SULFATE 2 GM/50ML IV SOLN
2.0000 g | Freq: Once | INTRAVENOUS | Status: AC
  Administered 2017-06-20: 2 g via INTRAVENOUS
  Filled 2017-06-20: qty 50

## 2017-06-20 MED ORDER — DEXTROSE 5 % IV SOLN
500.0000 mg | Freq: Once | INTRAVENOUS | Status: AC
  Administered 2017-06-20: 500 mg via INTRAVENOUS
  Filled 2017-06-20: qty 5

## 2017-06-20 NOTE — Progress Notes (Addendum)
Subjective: States the Migraine cocktail was of no help. Feels the benzodiazapine prior to sleep seemed to help the most. Currently a 3/10 but now returning and worsening. HA described a constant and frontal . Currently going to EEG. No speech issues.   Exam: Vitals:   06/20/17 0612 06/20/17 0854  BP: (!) 90/43 105/67  Pulse: 62 63  Resp: 16   Temp: 99.2 F (37.3 C) 98.8 F (37.1 C)  SpO2: 95% 97%    Physical Exam   HEENT-  Normocephalic, no lesions, without obvious abnormality.  Normal external eye and conjunctiva.   Extremities- Warm, dry and intact Musculoskeletal-no joint tenderness, deformity or swelling Skin-warm and dry, no hyperpigmentation, vitiligo, or suspicious lesions    Neuro:  Mental Status: Alert, oriented, thought content appropriate.  Speech fluent without evidence of aphasia.  Able to follow 3 step commands without difficulty. Cranial Nerves: II: Discs flat bilaterally; Visual fields grossly normal,  III,IV, VI: ptosis not present, extra-ocular motions intact bilaterally pupils equal, round, reactive to light and accommodation V,VII: smile symmetric, facial light touch sensation normal bilaterally VIII: hearing normal bilaterally IX,X: uvula rises symmetrically XI: bilateral shoulder shrug XII: midline tongue extension Motor: Right : Upper extremity   5/5    Left:     Upper extremity   5/5  Lower extremity   5/5     Lower extremity   5/5 Tone and bulk:normal tone throughout; no atrophy noted Sensory: Pinprick and light touch intact throughout, bilaterally Deep Tendon Reflexes: 2+ and symmetric throughout Plantars: Right: downgoing   Left: downgoing Cerebellar: normal finger-to-nose,and normal heel-to-shin test     Medications:  Prior to Admission:  (Not in a hospital admission) Scheduled: . acebutolol  200 mg Oral QHS  . apixaban  5 mg Oral BID  . ezetimibe  10 mg Oral Daily  . levothyroxine  88 mcg Oral QAC breakfast   Continuous: . sodium  chloride 100 mL/hr (06/20/17 0416)    Pertinent Labs/Diagnostics:     Ct Angio Neck W Or Wo Contrast  Result Date: 06/19/2017 CLINICAL DATA:  67 year old female code stroke with abnormal speech. Last seen normal 1630 hours. On Eliquis EXAM: CT ANGIOGRAPHY HEAD AND NECK CT PERFUSION BRAIN TECHNIQUE: Multidetector CT imaging of the head and neck  IMPRESSION: 1. Negative for large vessel occlusion. And negative for arterial stenosis in the head or neck. 2. No core infarct or ischemia detected by CT perfusion. 3. Proximal right ICA atherosclerosis. Mild bilateral ICA siphon dolichoectasia. 4. Left cardiac pacemaker.  Prior sternotomy. Electronically Signed   By: Genevie Ann M.D.   On: 06/19/2017 19:19   Ct Cerebral Perfusion W Contrast  Result Date: 06/19/2017 CLINICAL DATA:  67 year old female code stroke with abnormal speech. Last seen normal 1630 hours. On Eliquis EXAM: CT ANGIOGRAPHY HEAD AND NECK CT PERFUSION BRAIN TECHNIQUE: Multidetector CT imaging of the  IMPRESSION: 1. Negative for large vessel occlusion. And negative for arterial stenosis in the head or neck. 2. No core infarct or ischemia detected by CT perfusion. 3. Proximal right ICA atherosclerosis. Mild bilateral ICA siphon dolichoectasia. 4. Left cardiac pacemaker.  Prior sternotomy. Electronically Signed   By: Genevie Ann M.D.   On: 06/19/2017 19:19   Ct Head Code Stroke Wo Contrast  Result Date: 06/19/2017  IMPRESSION: 1. Stable and normal noncontrast CT appearance of the brain. 2. ASPECTS is 10. 3. These results were communicated to Dr. Leonel Ramsay at 6:56 pmon 5/28/2019by text page via the [facility] messaging system. Electronically Signed  By: Genevie Ann M.D.   On: 06/19/2017 18:56    Impression: 67 year old female with recurrent stereotyped episodes of confusion/aphasia associated with headache.  No aphasia today but feels her HA is coming back. No help with Migraine Cocktail. Will attempt Mg+ 2 Gram IV with 500 mg IV Depakote.     Recommendations: --Mg+ IV 2 mg once --Depakote IV 500 mg once   Etta Quill PA-C [facility] [phone]  06/20/2017, 9:27 AM  I have seen the patient and reviewed the above note.   Her husband states that she was slightly groggy on awakening, but no further aphasia since last night.   Complicated migraine remains a possibility, though psychogenic overlay could also be playing a role. With recurrent stereotyped spells, I think EEG is indicated, but I have low suspicion for seizure. If this is negative, I would not do any further workup.   With normal perfusion and the character of her deficits, I think stroke/TIA much less likely.   Roland Rack, MD [facility] [phone]  If 7pm- 7am, please page neurology on call as listed in [place].

## 2017-06-20 NOTE — ED Notes (Signed)
E signature not working. Pt verbalizes understanding of discharge teaching. Pt in stable condition. Denies pain or discomfort. No s/sx distress noted.

## 2017-06-20 NOTE — Procedures (Signed)
HPI:  67 y/o with intermittent aphasia and MS change  TECHNICAL SUMMARY:  A multichannel referential and bipolar montage EEG using the standard international 10-20 system was performed on the patient described as awake and drowsy.  The dominant background activity consists of 9-10 hertz activity seen most prominantly over the posterior head region.  The backgound activity is reactive to eye opening and closing procedures.  Low voltage fast (beta) activity is distributed symmetrically and maximally over the anterior head regions.  ACTIVATION:  Stepwise photic stimulation and hyperventilation are not performed.  EPILEPTIFORM ACTIVITY:  There were no spikes, sharp waves or paroxysmal activity.  SLEEP:  Stage 1 and 2 sleep are noted  IMPRESSION:  This is a normal EEG for the patients stated age.  There were no focal, hemispheric or lateralizing features.  No epileptiform activity was recorded.  A normal EEG does not exclude the diagnosis of a seizure disorder and if seizure remains high on the list of differential diagnosis, an ambulatory EEG may be of value.  Clinical correlation is required.

## 2017-06-20 NOTE — Discharge Summary (Signed)
[facility] Discharge Summary  Patient name: Amy Lane Medical record number: [id number] Date of birth: 1950-08-27 Age: 67 y.o. Gender: female Date of Admission: 06/19/2017  Date of Discharge: 06/20/2017 Admitting Physician: No admitting provider for patient encounter.  Primary Care Provider: Noralee Space, MD Consultants: Neurology  Indication for Hospitalization: Aphasia  Discharge Diagnoses/Problem List:  Intermittent aphasia Altered mental status Hyponatremia Hypokalemia afib with rvr HFpEF Depression dementia  Disposition: home  Discharge Condition: good  Discharge Exam: General: NAD Eyes: PERRL, EOMI, no conjunctival pallor or injection, ENTM: Moist mucous membranes, no pharyngeal erythema or exudate Neck: Supple, no LAD Cardiovascular: RRR, no m/r/g, no LE edema Respiratory: CTA BL, normal work of breathing Gastrointestinal: soft, normoactive BS GU: no CVA tenderness MSK: moves 4 extremities equally Derm: no rashes appreciated Neuro: no focal neuro deficits, cn 2-12 intact Psych: A&O to self and place, not time  Brief Hospital Course:  67 year old female who presented on 5/28 for aphasia. Code stroke activated. Evaluated by neurology who felt that patient was likely have symptoms secondary to complex migraine. CT head negative for stroke. Given migraine cocktail without much improvement. Patient able to sleep overnight which did make symptoms disappear. Had EEG performed which was negative for seizure. Neurology re-evaluated who felt that patient was ok for discharge given that she was back to her baseline. Has follow up with outpt neurolgist already scheduled.  Issues for Follow Up:  1. Follow up continued resolution of aphasia symptoms  Significant Procedures: eeg  Significant Labs and Imaging:  Recent Labs  Lab 06/19/17 1851 06/19/17 1930 06/20/17 0458  WBC  --  5.0 8.3  HGB 12.2 11.9* 11.4*  HCT 36.0 35.2* 33.2*   PLT  --  147* 155   Recent Labs  Lab 06/19/17 1851 06/19/17 1930 06/20/17 0458  NA 132* 128* 131*  K 3.5 3.2* 4.1  CL 98* 97* 101  CO2  --  20* 21*  GLUCOSE 93 93 112*  BUN 12 12 11   CREATININE 0.70 0.77 0.82  CALCIUM  --  8.8* 8.5*  ALKPHOS  --  53  --   AST  --  21  --   ALT  --  10*  --   ALBUMIN  --  3.4*  --       Results/Tests Pending at Time of Discharge:   Discharge Medications:  Allergies as of 06/20/2017      Reactions   Other Other (See Comments)   All QTc prolongation drugs   Statins Other (See Comments)   Muscle aches   Adhesive [tape] Rash   Latex Rash      Medication List    TAKE these medications   acebutolol 200 MG capsule Commonly known as:  SECTRAL TAKE 1 CAPSULE BY MOUTH EVERYDAY AT BEDTIME   Azelastine-Fluticasone 137-50 MCG/ACT Susp Commonly known as:  DYMISTA 1-2 sprays in each nostril BID as needed What changed:    how much to take  how to take this  when to take this  reasons to take this  additional instructions   citalopram 20 MG tablet Commonly known as:  CELEXA Take 1 tablet (20 mg total) by mouth daily.   donepezil 5 MG tablet Commonly known as:  ARICEPT Take 1 tablet (5 mg total) by mouth at bedtime.   doxycycline 100 MG tablet Commonly known as:  VIBRA-TABS 2 tabs today, then 1 tab daily until gone.   ELIQUIS 5 MG Tabs tablet Generic drug:  apixaban TAKE 1 TABLET TWICE A DAY   ezetimibe 10 MG tablet Commonly known as:  ZETIA TAKE 1 TABLET BY MOUTH EVERY DAY   ipratropium 0.06 % nasal spray Commonly known as:  ATROVENT Place 2 sprays into both nostrils 2 (two) times daily.   levothyroxine 88 MCG tablet Commonly known as:  SYNTHROID, LEVOTHROID TAKE 1 TABLET (88 MCG TOTAL) BY MOUTH DAILY BEFORE BREAKFAST.   linaclotide 72 MCG capsule Commonly known as:  LINZESS Take 1 capsule (72 mcg total) by mouth daily before breakfast.   LORazepam 1 MG tablet Commonly known as:  ATIVAN TAKE 1 TABLET BY MOUTH  3 TIMES A DAY AS NEEDED What changed:    how much to take  how to take this  when to take this   traMADol 50 MG tablet Commonly known as:  ULTRAM Take 1 tablet (50 mg total) by mouth 3 (three) times daily as needed for moderate pain.       Discharge Instructions: Please refer to Patient Instructions section of EMR for full details.  Patient was counseled important signs and symptoms that should prompt return to medical care, changes in medications, dietary instructions, activity restrictions, and follow up appointments.   Follow-Up Appointments: Follow-up Information    Neurologist Follow up.   Why:  Please follow up with your outpatient neurologist at next scheduled appointment          Guadalupe Dawn, MD 06/20/2017, 11:38 PM PGY-1, [address]

## 2017-06-20 NOTE — ED Notes (Signed)
Pharmacy notified re: questionable infiltration of Magnesium in #18 L AC, per ED pharmacist there are no special instructions for the infiltration of Magnesium and that if the pt c/o pain at the site place a cool compress on the area, no redness noted on assessment, pt denies pain, IV removed and a new line was started #20 in R forearm

## 2017-06-20 NOTE — Progress Notes (Signed)
EEG Completed; Results Pending  

## 2017-06-20 NOTE — Evaluation (Signed)
Physical Therapy Evaluation Patient Details Name: Amy Lane MRN: [id number] DOB: 04-28-1950 Today's Date: 06/20/2017   History of Present Illness  Amy Lane is a 67 y.o. female presenting with slurring of speech. Today about 4 pm husband reports that patient was complaining about memory issue, confusion, flashing of light, and feeling off balance. He also reports some "slurred" speech. He reports that this has worsened sice coming in. [facility] about [facility] and called her husband. Husband called 911 about 5 pm. He reports that she has had intermittent speech changes. She does have a history of stroke in 1990's which did not lead to any permanent weakness other than some facial drooping.   Clinical Impression   Pt admitted with above diagnosis. Pt currently with functional limitations due to the deficits listed below (see PT Problem List). Presents with unsteady gait and history of falling; Recommend RW for stability with gait and Outpt PT follow for gait and balance;  Pt will benefit from skilled PT to increase their independence and safety with mobility to allow discharge to the venue listed below.       Follow Up Recommendations Outpatient PT(for gait and balance dysfunction)    Equipment Recommendations  Rolling walker with 5" wheels    Recommendations for Other Services       Precautions / Restrictions Precautions Precautions: Fall Precaution Comments: Fall risk greatly reduced with use of RW      Mobility  Bed Mobility Overal bed mobility: Independent                Transfers Overall transfer level: Needs assistance Equipment used: None Transfers: Sit to/from Stand Sit to Stand: Supervision         General transfer comment: Supervision for safety  Ambulation/Gait Ambulation/Gait assistance: Min guard;Supervision Ambulation Distance (Feet): 250 Feet Assistive device: None;IV Pole;Rolling walker (2 wheeled) Gait Pattern/deviations:  Decreased step length - right;Decreased step length - left     General Gait Details: initiated without assistive device and noted decr balance and general unsteadiness; walked with RW with much better stability; performed varying speeds and head turns while walking -- noted decr speed with head turns  Science writer    Modified Rankin (Stroke Patients Only)       Balance Overall balance assessment: History of Falls                                           Pertinent Vitals/Pain      Home Living Family/patient expects to be discharged to:: Private residence Living Arrangements: Spouse/significant other Available Help at Discharge: Family;Available 24 hours/day Type of Home: House Home Access: Stairs to enter   [employer] of Steps: 3          Prior Function Level of Independence: Independent               Hand Dominance   Dominant Hand: Right    Extremity/Trunk Assessment   Upper Extremity Assessment Upper Extremity Assessment: Defer to OT evaluation    Lower Extremity Assessment Lower Extremity Assessment: Overall WFL for tasks assessed(noting decr coordination with stepping)    Cervical / Trunk Assessment Cervical / Trunk Assessment: Normal  Communication   Communication: No difficulties  Cognition Arousal/Alertness: Awake/alert Behavior During Therapy: WFL for tasks assessed/performed Overall Cognitive  Status: Within Functional Limits for tasks assessed                                        General Comments      Exercises     Assessment/Plan    PT Assessment Patient needs continued PT services  PT Problem List Decreased activity tolerance;Decreased balance;Decreased mobility;Decreased coordination;Decreased knowledge of use of DME;Decreased safety awareness;Decreased knowledge of precautions       PT Treatment Interventions DME instruction;Gait training;Stair  training;Functional mobility training;Therapeutic activities;Therapeutic exercise;Balance training;Patient/family education;Neuromuscular re-education    PT Goals (Current goals can be found in the Care Plan section)  Acute Rehab PT Goals Patient Stated Goal: get better PT Goal Formulation: With patient Time For Goal Achievement: 07/04/17 Potential to Achieve Goals: Good    Frequency Min 3X/week   Barriers to discharge        Co-evaluation               AM-PAC PT "6 Clicks" Daily Activity  Outcome Measure Difficulty turning over in bed (including adjusting bedclothes, sheets and blankets)?: None Difficulty moving from lying on back to sitting on the side of the bed? : None Difficulty sitting down on and standing up from a chair with arms (e.g., wheelchair, bedside commode, etc,.)?: A Little Help needed moving to and from a bed to chair (including a wheelchair)?: A Little Help needed walking in hospital room?: A Little Help needed climbing 3-5 steps with a railing? : A Little 6 Click Score: 20    End of Session Equipment Utilized During Treatment: Gait belt Activity Tolerance: Patient tolerated treatment well Patient left: in bed;with call bell/phone within reach;with family/visitor present Nurse Communication: Mobility status PT Visit Diagnosis: Unsteadiness on feet (R26.81);History of falling (Z91.81)       Time: 1410-1433 PT Time Calculation (min) (ACUTE ONLY): 23 min   Charges:   PT Evaluation $PT Eval Low Complexity: 1 Low PT Treatments $Gait Training: 8-22 mins   PT G Codes:        I was present during the PT session and agree with patient status and findings as outlined by Fortino Sic, SPT.  The licensed clinician was present and actively directing the care throughout the session at all times.   Roney Marion, [place]  Acute Rehabilitation Services Pager [phone] Office [phone]   Colletta [place] 06/20/2017, 3:30 PM

## 2017-06-21 ENCOUNTER — Encounter: Payer: Self-pay | Admitting: Neurology

## 2017-06-21 ENCOUNTER — Other Ambulatory Visit: Payer: Self-pay | Admitting: Neurology

## 2017-06-21 ENCOUNTER — Ambulatory Visit (INDEPENDENT_AMBULATORY_CARE_PROVIDER_SITE_OTHER): Payer: PRIVATE HEALTH INSURANCE | Admitting: Neurology

## 2017-06-21 DIAGNOSIS — G43109 Migraine with aura, not intractable, without status migrainosus: Secondary | ICD-10-CM

## 2017-06-21 HISTORY — DX: Migraine with aura, not intractable, without status migrainosus: G43.109

## 2017-06-21 MED ORDER — TOPIRAMATE 25 MG PO TABS: ORAL_TABLET | ORAL | 3 refills | Status: DC

## 2017-06-21 NOTE — Progress Notes (Signed)
Reason for visit: Migraine headache  Amy Lane is an 67 y.o. female  History of present illness:  Amy Lane is a 67 year old right-handed white female with a history of Ehlers-Danlos syndrome.  The patient has a history of migraine headache with neurologic features.  The patient was just seen in the emergency room on 19 Jun 2017 with an episode of a aphasia, confusion, slurred speech.  The patient had flashing lights in the vision, and gait ataxia.  The episode lasted about 4 hours and then cleared, the patient developed a severe headache.  CT of the brain was done and did not show an acute stroke event, CT angiogram of the head and neck was unremarkable.  The patient had a similar event that was less severe in April 2019.  A carotid Doppler study was done and was normal.  The patient has also had at least one event of geometric shapes in the left eye only, this may have represented a retinal migraine.  The patient is not on any daily prophylactic medications for migraine.  She is on Eliquis.  At this point the patient is back to her neurologic baseline.  She was noted to have hyponatremia with a sodium of 131 in the emergency room.   Past Medical History:  Diagnosis Date  . Abnormal gait   . Allergic rhinitis   . Atrial fibrillation ([place])   . Carcinomas, basal cell   . Cardiac pacemaker    DDD  MDT  . Cerebrovascular disease   . Depression   . Dysautonomia ([place])   . Ehler's-Danlos syndrome   . Fibromyalgia   . Headache(784.0)    Migraine  . Hemorrhoids   . Herpes zoster   . History of syncope   . History of tachycardia-bradycardia syndrome   . History of transient ischemic attack   . Hypercholesteremia   . Hypercholesterolemia   . Hypothyroidism   . Long term (current) use of anticoagulants   . Mitral valve prolapse   . PAT (paroxysmal atrial tachycardia) ([place])   . PSVT (paroxysmal supraventricular tachycardia) ([place])   . Skin desquamation    inflammative vaginitis   . SVT (supraventricular tachycardia) ([facility])     Past Surgical History:  Procedure Laterality Date  . CAROTID ENDARTERECTOMY    . complex mitral valve repair     at the [facility]  . INSERT / REPLACE / REMOVE PACEMAKER    . left carotid endarterectomy  11/2003   by Dr. Delton See  . PACEMAKER INSERTION     medtronic kappa 901  . removed chalazion from left eye lid  09/2011   Dr. Satira Sark    Family History  Problem Relation Age of Onset  . Lung cancer Mother   . Prostate cancer Father   . Alzheimer's disease Father   . Heart disease Father   . Ehlers-Danlos syndrome Father   . Mitral valve prolapse Brother   . Diabetes Sister   . Mitral valve prolapse Sister   . Leukemia Other        Nephew  . Heart disease Unknown        entire maternal side of family  . Cancer Unknown        entire maternal side of family    Social history:  reports that she has never smoked. She has never used smokeless tobacco. She reports that she does not drink alcohol or use drugs.    Allergies  Allergen Reactions  . Other Other (  See Comments)    All QTc prolongation drugs  . Statins Other (See Comments)    Muscle aches  . Adhesive [Tape] Rash  . Latex Rash    Medications:  Prior to Admission medications   Medication Sig Start Date End Date Taking? Authorizing Provider  acebutolol (SECTRAL) 200 MG capsule TAKE 1 CAPSULE BY MOUTH EVERYDAY AT BEDTIME 05/22/17  Yes Josue Hector, MD  Azelastine-Fluticasone (DYMISTA) 137-50 MCG/ACT SUSP 1-2 sprays in each nostril BID as needed Patient taking differently: Place 1-2 sprays into both nostrils 2 (two) times daily as needed (for congestion or allergies).  06/12/16  Yes Noralee Space, MD  citalopram (CELEXA) 20 MG tablet Take 1 tablet (20 mg total) by mouth daily. 03/20/17  Yes Noralee Space, MD  donepezil (ARICEPT) 5 MG tablet Take 1 tablet (5 mg total) by mouth at bedtime. 06/20/16  Yes Kathrynn Ducking, MD  ELIQUIS 5 MG TABS tablet TAKE 1  TABLET TWICE A DAY 08/22/16  Yes Josue Hector, MD  ezetimibe (ZETIA) 10 MG tablet TAKE 1 TABLET BY MOUTH EVERY DAY 02/19/17  Yes Noralee Space, MD  ipratropium (ATROVENT) 0.06 % nasal spray Place 2 sprays into both nostrils 2 (two) times daily. 04/11/17  Yes Noralee Space, MD  levothyroxine (SYNTHROID, LEVOTHROID) 88 MCG tablet TAKE 1 TABLET (88 MCG TOTAL) BY MOUTH DAILY BEFORE BREAKFAST. 05/22/17  Yes Noralee Space, MD  linaclotide [facility]) 72 MCG capsule Take 1 capsule (72 mcg total) by mouth daily before breakfast. 06/16/15  Yes Milus Banister, MD  LORazepam (ATIVAN) 1 MG tablet TAKE 1 TABLET BY MOUTH 3 TIMES A DAY AS NEEDED Patient taking differently: Take 1 mg by mouth three times a day as needed for anxiety 05/22/17  Yes Noralee Space, MD    ROS:  Out of a complete 14 system review of symptoms, the patient complains only of the following symptoms, and all other reviewed systems are negative.  Runny nose Light sensitivity, blurred vision Choking Nausea Dizziness, headache, speech difficulty Confusion Walking difficulty  Blood pressure (!) 102/59, pulse (!) 57, height 5\' 11"  (1.803 m), weight 137 lb (62.1 kg).  Physical Exam  General: The patient is alert and cooperative at the time of the examination.  Skin: No significant peripheral edema is noted.   Neurologic Exam  Mental status: The patient is alert and oriented x 3 at the time of the examination. The patient has apparent normal recent and remote memory, with an apparently normal attention span and concentration ability.  Mini-Mental status examination done today shows a total score 29/30.   Cranial nerves: Facial symmetry is present. Speech is normal, no aphasia or dysarthria is noted. Extraocular movements are full. Visual fields are full.  Motor: The patient has good strength in all 4 extremities.  Sensory examination: Soft touch sensation is symmetric on the face, arms, and legs.  Coordination: The patient  has good finger-nose-finger and heel-to-shin bilaterally.  Gait and station: The patient has a normal gait. Tandem gait is slightly unsteady. Romberg is negative. No drift is seen.  Reflexes: Deep tendon reflexes are symmetric.   CT head 06/19/17:  IMPRESSION: 1. Stable and normal noncontrast CT appearance of the brain.   CTA head and neck 06/19/17:  IMPRESSION: 1. Negative for large vessel occlusion. And negative for arterial stenosis in the head or neck. 2. No core infarct or ischemia detected by CT perfusion. 3. Proximal right ICA atherosclerosis. Mild bilateral ICA siphon dolichoectasia. 4.  Left cardiac pacemaker.  Prior sternotomy.  * CT scan images were reviewed online. I agree with the written report.    Assessment/Plan:  1.  Migraine headache with neurologic features  2.  Mild memory disturbance  The patient will be placed on low-dose Topamax to help prevent migraine.  We may consider Aimovig in the future.  The patient cannot take Depakote as she is on Eliquis.  The patient is running heart rates in the 50s, I would not use a beta-blocker.  The patient will follow-up in 4 months.  She will call if there are any side effects on the medication.  She reports that bright lights or flashing lights are an activating factor for her migraine.  She is to wear dark glasses when outside in the summertime.  [name] MD 06/21/2017 11:02 AM  [facility] [address] [place], [place] [address]  Phone [phone] Fax [phone]

## 2017-06-27 ENCOUNTER — Ambulatory Visit: Payer: 59 | Admitting: Pulmonary Disease

## 2017-07-11 ENCOUNTER — Ambulatory Visit (INDEPENDENT_AMBULATORY_CARE_PROVIDER_SITE_OTHER): Payer: 59 | Admitting: Pulmonary Disease

## 2017-07-11 ENCOUNTER — Encounter: Payer: Self-pay | Admitting: Pulmonary Disease

## 2017-07-11 VITALS — BP 106/62 | HR 75 | Temp 98.1°F | Ht 71.0 in | Wt 135.6 lb

## 2017-07-11 DIAGNOSIS — K5909 Other constipation: Secondary | ICD-10-CM

## 2017-07-11 DIAGNOSIS — I481 Persistent atrial fibrillation: Secondary | ICD-10-CM

## 2017-07-11 DIAGNOSIS — Z8679 Personal history of other diseases of the circulatory system: Secondary | ICD-10-CM

## 2017-07-11 DIAGNOSIS — F341 Dysthymic disorder: Secondary | ICD-10-CM

## 2017-07-11 DIAGNOSIS — Q796 Ehlers-Danlos syndrome, unspecified: Secondary | ICD-10-CM

## 2017-07-11 DIAGNOSIS — E039 Hypothyroidism, unspecified: Secondary | ICD-10-CM

## 2017-07-11 DIAGNOSIS — R413 Other amnesia: Secondary | ICD-10-CM

## 2017-07-11 DIAGNOSIS — Z9889 Other specified postprocedural states: Secondary | ICD-10-CM | POA: Diagnosis not present

## 2017-07-11 DIAGNOSIS — I679 Cerebrovascular disease, unspecified: Secondary | ICD-10-CM

## 2017-07-11 DIAGNOSIS — Z86718 Personal history of other venous thrombosis and embolism: Secondary | ICD-10-CM

## 2017-07-11 DIAGNOSIS — I059 Rheumatic mitral valve disease, unspecified: Secondary | ICD-10-CM

## 2017-07-11 DIAGNOSIS — I4819 Other persistent atrial fibrillation: Secondary | ICD-10-CM

## 2017-07-11 DIAGNOSIS — G901 Familial dysautonomia [Riley-Day]: Secondary | ICD-10-CM

## 2017-07-11 DIAGNOSIS — Z950 Presence of cardiac pacemaker: Secondary | ICD-10-CM

## 2017-07-11 DIAGNOSIS — E7800 Pure hypercholesterolemia, unspecified: Secondary | ICD-10-CM

## 2017-07-11 DIAGNOSIS — E559 Vitamin D deficiency, unspecified: Secondary | ICD-10-CM

## 2017-07-11 MED ORDER — LEVOTHYROXINE SODIUM 88 MCG PO TABS: 88.0000 ug | ORAL_TABLET | Freq: Every day | ORAL | 2 refills | Status: DC

## 2017-07-11 NOTE — Progress Notes (Signed)
Subjective:    Patient ID: Amy Lane, female    DOB: 07-27-1950, 67 y.o.   MRN: [id number]  HPI 67 y/o WF here for a follow up visit... she has mult med problems as noted below... she is followed regularly by Drs Maryellen Pile for Cardiology, and Drs Jeri Modena for GI... ~  SEE PREV EPIC NOTES FOR OLDER DATA >>     LABS 4/14:  FLP- not at goals on diet alone but intol to all meds;  Chems- wnl;  CBC- wnl;  TSH=3.10;  VitD=19 & Rec VitD OTC supplement ~2000u daily... ~  [id number]:  Amy Lane was [facility] - 01/30/13 by [facility] w/ HA & altered mental status- ?TIA vs complex migraine- CT Head was neg; CDopplers were ok; 2DEcho showed norm LVF, myxomatous MV s/p repair no resid MR; Coumadin was continued and ASA81 was added... She had ROV DrWillis 2/15> he felt the episode was a migraine equiv & no recurrent prob since then, she notes mild memory impairment w/ MMSE= 29/30... She notes that she's been under a lot of stress w/ father-in-law illness, husb w/ prostate ca, sold house & had to move...   CXR 01/28/13 showed norm heart size, pacemeaker & sternal wires, hyperinflation and asymmetric biapical pleural thickening L>R, NAD...  2DEcho 01/28/13 showed norm LV size & function w/ EF=55-60%, myxomatous MV s/p MVrepair & no resid MR, mild LA dil...  LABS 4/15:  FLP- at goals on Zetia10;  Chems- wnl;  CBC- wnl;  TSH=4.99... ~  [id number]:  Amy Lane has been under some stress- husb Fritz Pickerel) was Dx w/ recurrent prostate cancer at the [facility] & underwent XRT (they had to live in [place] for 82mo while he was receiving therapy);   LABS 3/16:  FLP- at goals on Zetia10;  Chems- wnl;  CBC- wnl;  TSH=3.24   Sed=15;  Anti-CCP=neg ~  [id number]:  Amy Lane report a good interval overall- she had some constipation, went to the ER (given BE prep)/ then on to see DrJacobs & started on [id number] which is working well for her.    ~  [id number]:  Amy Lane has had a lot going on neurologically in the interval>  She fell at home w/ fx bones in  left foot- eval & cast by DrHewitt; she had f/u DrWillis w/ mild cognitive impairment & he started Aricept5=>10 & pt feels much better, memory improved, not getting lost driving, etc;  Also says she was told "prob MS" (falls, weakness, bladder, etc) but they can't do MRI due to her pacer.    CXR 05/31/15>  Stable- norm heart size, s/p median sternotomy & MVR, pacer in situ, hyperinflated appearance suggests underlying COPD, scarring left apex, NAD...  LABS 05/31/15>  FLP- at goals on Zetia10;  Chems- wnl;  CBC- wnl;  TSH=2.08;   ~  [id number]:  Amy Lane has been doing well overall-- "the best I've felt since I was 41"... her TSH is elev & I suspect compliance vs interference in absorption- rec to take 1st thing in the AM by itself & do not eat or drink for 33min.  2DEcho 03/2016 showed mild conc LVH, norm LVF w/ EF=60-65%, no regional wall motion abn, Gr2DD, MV is mod thickened & myxomatous- s/p repair, triv MR...   CXR 06/12/16 (independently reviewed by me in the PACS system) showed hyperinflation/ body habitus, no focal parenchymal abn- NAD, norm heart size w/ prior median sternotomy & dual lead pacer in good position...   LABS 06/12/16>  FLP- all parameters  at goals on Zetia10;  Chems- wnl w/ BS=96, Cr=0.82, LFTs wnl;  CBC- wnl w/ Hg=13.6;  TSH=9.50 & advised proper dosing & compliance w/ recheck;  VitD=24 & rec 2000u/d supplement;  HepC=neg...    ~  July 25, 2016:  6wk ROV & add-on appt> Amy Lane returned from her fabulous [place] vacation & reports that mult members of her part got ill w/ URI symptoms; Amy Lane remained well until her return to [employer]- c/o sore throat, cough- small amt thick clear sput, no hemoptysis, low grade fever & chills, incr SOB- feels heavy in the chest/ tight/ some wheezing, denies CP/ palpit/ etc;  She's been taking OTC meds + the Dymista & Atrovent nasal... She is also due for a follow up TSH & confirms regular dosing/ 1st thing in the AM/ not w/ other meds/ & NPO for 45 min after the dose;   She is reminded to start regular VitD supplement ~2000u OTC Vit D supplement daily...    EXAM shows Afeb, VSS, O2sat=97% on RA;  Marfanoid habitus;  HEENT- neg, mallampati2;  Chest- few bibasilar rhonchi, no w/r/consolidation;  Heart- RR w/ gr1/6 sys murmur, no r/g;  Abd- soft, nontender;  Ext- neg w/o c/c/e...  CXR 07/25/16 (independently reviewed by me in the PACS system) showed similar to prev films- clear- NAD & no signs of pneumonia...  LABS 07/25/16>  CBC- wnl;  Sed=26;  TSH= 8.43 on Synthroid75... IMP/PLAN>>  Amy Lane has an upper resp infection after her [place] vacation during which several members of her team got ill;  CXR is clear & CBC is OK;  We discussed Rx w/ LEVAQUIN 500mg  x7d & Depo80 today followed by Pred dosepak starting tomorrow; also rest, fluids, etc;  We will increase her SYNTHROID to 171mcg/d & recheck labs just after Labor Day in Sept;  She is reminded to get an OTC VitD supplement ~2000u/d regularly...   ADDENDUM>>  LABS 09/26/16 on Synthroid 131mcg/d showed TSH=0.05;  This is sl over-replaced & REC to decrease by 1/2 step to SYNTHROID 49mcg tabs- one tab Qam (call in #30 or #90) and plan f/u TSH again in 37mo... ADDENDUM>>  LABS 12/19/16 on Synthroid 61mcg/d showed TSH=0.30 => REC to continue same for now...  ~  December 27, 2016:  38mo ROV & Pat reports a good interval without new complaints or concerns... We reviewed the following interval medical notes in Epic>      She saw GYN- EKey,NP on 10/03/16>  Had some spotting, note reviewed, exam showed some atrophy- no lesions & they planned sonar (we do not have results) & careful follow up...     She saw CARDS- DrNishan 12/11/16>  Hx bradycardia- s/p pacer followed by EP & doing satis; hx Ehlers-Danlos syndrome w/ MVP- s/p MV repair in [place]; hx TIA & L-CAE and doing well, no changes made & he continues to follow Q52mo...    She saw CARDS/EP- DrKlein on 12/25/16>  Hx sinus brady- s/p pacer w/ generator change & lead repair 03/2009; hx  Ehlers-Danlos, MVP- s/p MV repair at [facility]; & hx  TIA on Eliquis now- s/p CAE; also has long QT syndrome and dysautonomic symptoms c/w POTS;  She is doing well, gets pacer checks from home, rec to f/u 74yr... We reviewed the following medical problems during today's office visit>      AR> on Atrovent nasal, Benedryl OTC & Nasonex prn; breathing is good w/o cough, sput, SOB, etc; hx of rough time in spring w/ pollen => Rx w/ DYMISTA &  PRED Dosepak...    Hx Dysautonomia (POTS) & Ehlers-Danlos syndrome> followed by DrNishan/DrKlein & DrHawkes (seen w/ hand pain/weakness, polyarthralgia, OA- she is doing PT and loves it!); BP= 100/68 no postural change...    MVP, Myxomatous valve- s/p MVrepair (in [place])> on Eliquis5Bid; she is followed by DrKlein & Nishan- stable pacer checks & no changes made...    Hx Tachy-brady, Fib-flutter w/ ablation & pacer> on Sectral200/d, & Eliquis5Bid; EKG 10/17 showed electronic atrial pacer, bifascicular block w/ RBBB & left post fascicular block...    Cerebrovasc Dis> on Eliquis5Bid, s/p left CAE 2005 by [facility] for asymptomatic left carotid stenosis w/ ulceration, & f/u CDopplers by [facility]=> last 6/17 w/ mild homogeneous plaque, no signif stenoses...    ?DVT & ?PE while on Coumadin (during trip to [place])> she then checked her own protimes like they do in [place] & now on Eliquis5Bid & stable...    CHOL> on Zetia10 (INTOL statins) since 1/15 Hosp where LDL was 155; FLP on Zetia10 5/18 shows TChol 159, TG 71, HDL 51, LDL 94; she is intol to all other meds...    Hypothy> on Synthroid88 now; rec to take 1st thing in AM by itself, do not eat for 82min & we follow labs => TSH Nov2018 = 0.30 & we decided to keep the same for now...    Underweight> BMI in the 19-20 range & we discussed nutritional supplements etc=> wt is steady & she feels well...     GI- Constip & Hems> on Align; c/o constip & now improved on Linzess72/d per DrJacobs; last colon 2009 by DrJacobs  w/ hems only & f/u planned 20yrs...    GYN- kraurosis vulvae (DIV)> local GYN, [facility] vulva clinic treatment & she will keep up to date on screening; Hx enterococcus UTI...    Migraine HAs & ?TIA, Mild cognitive impairment, ?MS> she is followed by [facility]- DrWillis; Hosp 1/15 w/ another episode, symptoms cleared, ASA81 added to her Coumadin which was later changed to Eliquis5Bid; she had c/o some leg weakness but EMG/NCV was normal & rec to try PT=> much improved;  She reports [facility]CI on Aricept & improved memory she says; DrWillis indicated that he thinks she has MS but they can't get MRI w/ her pacer...    Vit D deficiency>  Labs 5/18 showed VitD level = 24 & she is rec to start VitD 2000u daily OTC supplement...    Anxiety/ Depression> on Ativan 1mg  prn (takes it Qhs) & Celexa20 which she really thinks is helping...  EXAM shows Afeb, VSS, O2sat=97% on RA;  Marfanoid habitus;  HEENT- neg, mallampati2;  Chest- few bibasilar rhonchi, no w/r/consolidation;  Heart- RR w/ gr1/6 sys murmur, no r/g;  Abd- soft, nontender;  Ext- neg w/o c/c/e... IMP/PLAN>>  Amy Lane is stable & advised to continue current meds the same;  She will remain active, continue nutritional supplements, call for any problems; we plan f/u 38mo...   ~  July 11, 2017:  62mo ROV & Amy Lane had a neuro scare- while driving home she developed a severe HA "so bad that I wanted to die"; went to ER to r/o stroke, she reports all tests came back NEG, she doesn't have much memory of the event; she saw DrWillis who felt it was a migraine equivalent & they started Topamax for prevention... We reviewed the following interval medical notes in Epic>      She was seen in the ER 06/19/17>  C/o confusion & speech abn (aphasic & speakng jibberish),  noted flashing lights and HA;  Labs showed Na=128, K=3.2;  She had CT Head and CTA Head & Neck, EEG was ordered;  She was seen by Neurology & given Ketorolac & Valproate;  Note reviewed & she was seen by DrKirkpatrick who  suspected complicated migraines due to her hx of similar episodes recently & no vasc stenoses being found...    She saw NEURO- DrWillis on 06/21/17>  Hx Ehlers-Danl;os, hx migraines and was seen in ER 06/19/17 w/ HA, aphasia, confusion, slurred speech, flashing lights, & gait ataxia; it lasted 4H & then cleared;  CT Brain did not show evidence of an acute stroke event;  CTA of the head & neck was unremarkable;  She had a similar less severe episode 04/2017;  CDoppler was WNL;  Sodium was sl low at 131;  Couldn't do MRI due to pacer;  DrWillis Dx Migraine w/ neurologic features & a mild memory disturbance; REC Topamax to try & prevent migraines... We reviewed the following medical problems during today's office visit>      AR> on Atrovent nasal, Benedryl OTC & Nasonex prn; breathing is good w/o cough, sput, SOB, etc; hx of rough time in spring w/ pollen => Rx w/ DYMISTA & PRED Dosepak...    Hx Dysautonomia (POTS) & Ehlers-Danlos syndrome> followed by DrNishan/DrKlein & DrHawkes (seen w/ hand pain/weakness, polyarthralgia, OA- she is doing PT and loves it!); BP= 106/62 no postural change...    MVP, Myxomatous valve- s/p MVrepair (in [place])> on Eliquis5Bid; she is followed by DrKlein & Nishan- stable pacer checks & no changes made...    Hx Tachy-brady, Fib-flutter w/ ablation & pacer> on Sectral200/d, & Eliquis5Bid; EKG 10/17 showed electronic atrial pacer, bifascicular block w/ RBBB & left post fascicular block...    Cerebrovasc Dis> on Eliquis5Bid, s/p left CAE 2005 by [facility] for asymptomatic left carotid stenosis w/ ulceration, & f/u CDopplers by [facility]=> last 5/19 w/ mild homogeneous plaque, no signif stenoses 1-39% & antegrade vertebrals...    ?DVT & ?PE while on Coumadin (during trip to [place])> she then checked her own protimes like they do in [place] & now on Eliquis5Bid & stable...    CHOL> on Zetia10 (INTOL statins) since 1/15 Hosp where LDL was 155; FLP on Zetia10 6/19 shows TChol 166, TG  80, HDL 56, LDL 93; she is intol to all other meds...    Hypothy> on Synthroid88 now; rec to take 1st thing in AM by itself, do not eat for 67min & we follow labs => TSH Nov2018 = 0.30 & we decided to keep the same for now...    Underweight> BMI in the 19-20 range & we discussed nutritional supplements etc=> wt is steady (~135#) & she feels well...     GI- Constip & Hems> on Align; c/o constip & now improved on Linzess72/d per DrJacobs; last colon 2009 by DrJacobs w/ hems only & f/u planned 45yrs...    GYN- kraurosis vulvae (DIV)> local GYN, [facility] vulva clinic treatment & she will keep up to date on screening; Hx enterococcus UTI...    Migraine HAs & ?TIA, Mild cognitive impairment, ?MS> she is followed by [facility]- DrWillis; Hosp 1/15 w/ another episode, symptoms cleared, ASA81 added to her Coumadin which was later changed to Eliquis5Bid; she had c/o some leg weakness but EMG/NCV was normal & rec to try PT=> much improved;  She reports MCI on Aricept & improved memory she says; DrWillis indicated that he thinks she has MS but they can't get MRI  w/ her pacer; she's had complex neuro symptoms w/ TIA-like episodes felt to be migraine equivalents and DrWillis started Topamax preventive rx...    Vit D deficiency>  Labs 5/18 showed VitD level = 24 & she is rec to start VitD 2000u daily OTC supplement...    Anxiety/ Depression> on Ativan 1mg  prn (takes it Qhs) & Celexa20 which she really thinks is helping...  EXAM shows Afeb, VSS, O2sat=97% on RA;  Marfanoid habitus;  HEENT- neg, mallampati2, scar left carotid surg;  Chest- few bibasilar rhonchi, no w/r/consolidation;  Heart- RR w/ gr1/6 sys murmur, no r/g;  Abd- soft, nontender;  Ext- neg w/o c/c/e...  CT Head & CT Angio Head & Neck>  NEG for large vessel occlusion & NEG  For arterial stenosis in head or neck; no infarct or ischemia; prox right ICA atherosclerosis...  LABS 05/2017>  Chems- low sodium=128-132, K=3.2-4.1, BS=90-110, Cr=0.70-0.82, LFTs  wnl;  CBC- Hg=11.4-12.2,  WBC=5-8K;  TSH=0.18;  UA clear IMP/PLAN>>  Pat had a neuro scare but recovered & has a diagnosis- felt to be a migraine equiv & DrWillis has started Topamax as a preventive, hopefully this will work well for her;  Rec to continue her other meds the sam, continue her ecercise program, & call for any questions...            Problem List:      ALLERGIC RHINITIS (ICD-477.9) - we discussed Rx w/ Zyrtek, Astepro Prn, etc... ~  CXR 3/11 showed left pacer, sternal wires, NAD.Marland Kitchen. ~  CXR 6/13 showed stable post-op changes and pacer, pulm hyperinflation & scattered scarringt, calcif mediastinal nodes, no focal opacities, NAD.Marland Kitchen. ~  CXR 1/15 showed norm heart size, post op changes & pacer, no adenop, no edema, clear x some left apical pleural thickening w/o change...  MITRAL VALVE PROLAPSE (ICD-424.0) - followed by Cherly Hensen... ~  2DEcho 11/08 showed myxomatous MV, annuloplasty ring, decr post leaflet excursion, norm LVH & wall motion... ~  NuclearStressTest 11/08 showed mild apical thinning... prev study 12/04 w/o ischemia or infarct & EF=68%... ~  she sees [place] every 6 months- for f/u MVP (s/p MV repair at [facility]), dysautonomia (s/p pacer), & Ehlers-Danlos Syndrome> last seen 1/15 & note reviewed, EKG w/ irreg rhythm, borderline QT interval... ~  2DEcho 1/15> cavity size was normal, wall thickness was normal w/ norm systolic function was normal & EF= 55% to 60%, myxomatous MV s/p repair with no residual MR, LA is mildly dilated.  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 hx AFib/Flutter w/ ablation & pacemaker placed ... she has Ehlers-Danlos synd w/ prolonged QT interval...  CARDIAC PACEMAKER IN SITU (ICD-V45.01) - f/u DrKlein w/ CTChest 4/09- neg. ~  dual chamber pacer changed 3/11 by DrKlein for end-of-life... ~  EKG shows NSR, 1st degree AVB, RBBB... ~  3/15: she had f/u DrKlein> note reviewed, pacer functioning  well- no changes made but he wanted her to consider a NOAC...  DYSAUTONOMIA (ICD-742.8) - prev on Proamatine 5mg  tabs per DrKlein, but this was stopped...  CEREBROVASCULAR DISEASE (ICD-437.9) - on ASA 81mg /d & COUMADIN => 11/2014 she was switched to Eliquis5Bid... ~  she had a Left CAE 11/05 by [facility] for symptomatic left carotid stenosis w/ ulceration... ~  Carotid angiogram 5/08 by [facility] showed <30% right carotid stenosis ~  f/u CTA of Head & Neck 12/09 was neg= norm CTA head, & no signif carotid dis noted in neck. ~  CDoppler 2/11 showed stable mild carotid  dis, left CAE w/ DPA is patent, 0-39% bilat ICA stenoses. ~  repear CDopplers 11/11 per Cherly Hensen showed smooth plaque in right bulb & distal left CCA- stable; 0-39% bilat ICA stenoses... ~  CT Head 6/13 is neg- No acute intracranial abnormalities... ~  CTA Brain 6/13 showed mild atherosclerotic dis in the carotid siphon regions bilat w/o stenoses, otherw norm CTA brain w/o lesions... ~  CT Angio Neck 6/13 via ER showed mild nonstenotic arteriosclerotic dis at both carotid bifurcations w/o signif stenoses... ~  CDopplers 11/13 showed mild soft smooth plaque w/ 0-39% right ICAstenosis & 40-59% left ICA stenosis, vertebrals are antegrade... ~  CDopplers 1/15 showed 1-39% internal carotid artery stenosis bilaterally & antegrade vertebral flow... ~  CDopplers 2/16 showed mixed plaque bilat, stable 1-39% bilat ICAstenoses w/ left CEA & DPA, norm subclav & patent vertebrals...  ?DVT & poss PTE while on vacation in [place] during the summer of 2012 >> pt was on Coumadin & INR was therapeutic; she had pain & swelling in left leg w/ a pos doppler reported by a [place] physician; she was also SOB & they questioned poss PTE but didn't get scan or further eval;  On return to [place] DrNishan wondered if she might have bled into the leg muscles & sent her to DrNorris for Ortho eval; he could not corroborate the theory & treated her w/ compression stockings &  she gradually improved towards her baseline...  HYPERCHOLESTEROLEMIA (ICD-272.0) - on diet alone now, prev on Lip20- this was stopped 1/12 by Cherly Hensen & she feels much better off this med! ~  [place] 05/13/07 on Lip20 showed TChol 119, TG 50, HDL 55, LDL 54 ~  FLP 5/10 on Lip20 showed TChol 120, TG 43, HDL 58, LDL 54 ~  FLP 9/11 on Lip20 showed TChol 130, TG 54, HDL 59, LDL 60 ~  FLP 5/12 on diet alone showed TChol 218, TG 91, HDL 57, LDL 135... Continue low chol, low fat diet. ~  FLP 10/12 showed TChol 231, TG 86, HDL 68, LDL 151... I rec the Lipid CLinic, & they started NIACIN 500mg /d. ~  FLP 2/13 on Niacin500 showed TChol 199, TG 55, HDL 76, LDL 112... Much improved, continue same. ~  FLP 11/13 on diet alone showed TChol 231, TG 49, HDL 67, LDL 151..,. rec refer to Lipid clinic. ~  [place] 4/14 on diet + Niacin showed TChol 233, TG 78, HDL 75, LDL 146  ~  FLP 1/15 in hosp showed Tchol 235, TG 79, HDL 64, LDL 155 => ZETIA 10mg /d started... ~  [place] 3/15 on Zetia10 showed TChol 172, TG 74, HDL 59, LDL 89... Continue same... ~  [place] 3/16 on Zetia10 showed TChol 167, TG 66, HDL 61, LDL 93  HYPOTHYROIDISM (ICD-244.9) - currently on SYNTHROID 17mcg/d... ~  labs 4/09 on Levoth88 showed TSH = 0.10... rec to decr Synthroid to 78mcg/d... ~  labs 9/09 on Levoth75 showed TSH = 1.67 ~  labs 5/10 on Levoth75 showed TSH = 2.92... Continue same. ~  labs 9/11 on Levoth75 showed TSH= 1.37 ~  Labs 5/12 on Levothy75 showed TSH= 4.33 ~  Labs 10/12 on Levothy75 showed TSH= 1.51... Continue same. ~  Labs 11/13 on Levothy75 showed TSH= 1.55 ~  Labs 4/14 on Levothy75 showed TSH= 3.10 ~  Labs 3/15 on Levothy75 showed TSH= 4.99... Reminded to take every AM on empty stomach etc. ~  Labs 3/16 showed TSH= 3.24  HEMORRHOIDS (ICD-455.6) - last colonoscopy 2/09 by DrJacobs showed only sm  hems... f/u 51yrs.  GYN - followed by DrMcPhail and Dx w/ kraurosis vulvae 7/10... Prev on Premarin VagCream. ~  11/10: she discussed the Dx  w/ me & will inquire about poss hyst/ BSO... ~  4/11:  eval in the [facility] by Dr Illene Bolus showed vulvar dermatitis & dryness w/ rec for "Crisco" Tid! 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   FIBROMYALGIA (ICD-729.1) - she has been doing Yoga classes and this has helped... ~  She saw DrNorris 9/14 w/ neck pain> CT CSpine showed degen disc dis & he rec PT... ~  3/16: she is c/o persistent bilat hand pain & swelling, not much better w/ OTC NSAIDs; we decided to check Anti-CCP (neg) & Sed (15); we will Rx w/ Tramadol50Tid prn, hot soaks, & refer to Rheum for further eval...  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  Hx of TRANSIENT ISCHEMIC ATTACK (ICD-435.9) - on ASA 81mg /d & COUMADIN> she has been followed by DrWillis; s/p left carotid endarterectomy 11/05 by [facility] ~  1/12: f/u eval by DrWillis reviewed... ~  6/13: she presented w/ ?TIA symptoms & eval was neg; review by DrWillis indicated prob Migraine & everything resolved... ~  1/15: she had another episode, Hosp by [facility] x2d but felt to prob be Migraine related... ~  11/15: she had f/u appt w/ DrWillis> Hx Ehlers-Danlos, Migraines, & c/o some leg weakness (some difficulty rising from squat); he did EMG/NCV (neg-no myopathy, no neuropathy) & rec phys therapy  Hx of SYNCOPE (ICD-780.2) - felt to be neurally mediated, ? related to long QT, no recur since pacer placed...  MIGRAINE HEADACHE >> she presented w/ ?TIA symptoms 6/13 & eval was neg; review by DrWillis indicated prob Migraine & everything resolved, no recurrence... ~  Seen by Neuro 2/15 DrWillis w/ migraines, memory deficit, abn of gait;   DEPRESSION (ICD-311) - she takes LORAZEPAM 1mg Qhs for insomnia... she prev saw DrGraves for counselling in [place]... ~  11/10: poor appetite & some wt loss prob related to  depression & we discussed trial Lexapro 10mg /d... ~  3/11: she is very pleased- feels better, gained 5# w/ Ensure, etc... ~  9/11:  "I feel great" & Lexapro continued... ~  She was able to stop the Lexapro 1/12 (also stopped the Lipitor) & has done well off it ever since... ~  10/13:  She called w/ situational depression due to marital stress, getting counseling, etc; started PROZAC 20mg /d & she reports much improved... ~  2015: she is off the Prozac and using Ativan 1mg  prn... ~  9/15: on Ativan1mg  prn and CELEXA20 called in several mo ago & she is much improved...  CARCINOMA, BASAL CELL (ICD-173.9) - skin cancer removed from left leg by [facility] 2008. ~  12/11:  She reports bx of rash on legs= lichen planus per DrHall, treated w/ topical ointment... ~  2/13:  She had a skin lesion removed from her neck= basal cell ca & wide excision performed by DrLupton.  Health Maintenance: ~  GI:  followed by DrJacobs w/ colonoscopy 2/09 showing only sm hems... ~  GYN:  followed by DrMcPhail, and Dr. Illene Bolus at [facility]... ~  Labs 9/11 showed Vit D level = 39 & rec to start 1000 u OTC supplement... ~  Immunizations:  she had TDAP 3/11... she gets yearly Flu vaccine in the  Fall of the yr...    Past Surgical History:  Procedure Laterality Date  . CAROTID ENDARTERECTOMY    . complex mitral valve repair     at the [facility]  . INSERT / REPLACE / REMOVE PACEMAKER    . left carotid endarterectomy  11/2003   by Dr. Delton See  . PACEMAKER INSERTION     medtronic kappa 901  . removed chalazion from left eye lid  09/2011   Dr. Satira Sark    Outpatient Encounter Medications as of 07/11/2017  Medication Sig  . acebutolol (SECTRAL) 200 MG capsule TAKE 1 CAPSULE BY MOUTH EVERYDAY AT BEDTIME  . Azelastine-Fluticasone (DYMISTA) 137-50 MCG/ACT SUSP 1-2 sprays in each nostril BID as needed (Patient taking differently: Place 1-2 sprays into both nostrils 2 (two) times daily as needed (for  congestion or allergies). )  . citalopram (CELEXA) 20 MG tablet Take 1 tablet (20 mg total) by mouth daily.  Marland Kitchen donepezil (ARICEPT) 5 MG tablet Take 1 tablet (5 mg total) by mouth at bedtime.  Marland Kitchen ELIQUIS 5 MG TABS tablet TAKE 1 TABLET TWICE A DAY  . ezetimibe (ZETIA) 10 MG tablet TAKE 1 TABLET BY MOUTH EVERY DAY  . ipratropium (ATROVENT) 0.06 % nasal spray Place 2 sprays into both nostrils 2 (two) times daily.  Marland Kitchen levothyroxine (SYNTHROID, LEVOTHROID) 88 MCG tablet Take 1 tablet (88 mcg total) by mouth daily before breakfast.  . linaclotide (LINZESS) 72 MCG capsule Take 1 capsule (72 mcg total) by mouth daily before breakfast.  . LORazepam (ATIVAN) 1 MG tablet TAKE 1 TABLET BY MOUTH 3 TIMES A DAY AS NEEDED (Patient taking differently: Take 1 mg by mouth three times a day as needed for anxiety)  . topiramate (TOPAMAX) 25 MG tablet Take one tablet at night for one week, then take 2 tablets at night  . [DISCONTINUED] levothyroxine (SYNTHROID, LEVOTHROID) 88 MCG tablet TAKE 1 TABLET (88 MCG TOTAL) BY MOUTH DAILY BEFORE BREAKFAST.   No facility-administered encounter medications on file as of 07/11/2017.     Allergies  Allergen Reactions  . Other Other (See Comments)    All QTc prolongation drugs  . Statins Other (See Comments)    Muscle aches  . Adhesive [Tape] Rash  . Latex Rash    Immunization History  Administered Date(s) Administered  . Influenza Split 11/21/2010, 11/21/2011  . Influenza Whole 02/14/2007, 10/16/2007, 10/13/2009  . Influenza,inj,Quad PF,6+ Mos 11/19/2012, 10/06/2013, 11/30/2014, 11/29/2015, 10/03/2016  . Pneumococcal Conjugate-13 05/31/2015  . Pneumococcal Polysaccharide-23 01/24/2003  . Td 04/16/2009    Current Medications, Allergies, Past Medical History, Past Surgical History, Family History, and Social History were reviewed in [employer] record.    Review of Systems         See HPI - all other systems neg except as noted... The patient  complains of dyspnea on exertion.  The patient denies anorexia, fever, weight loss, weight gain, vision loss, decreased hearing, hoarseness, chest pain, syncope, peripheral edema, prolonged cough, headaches, hemoptysis, abdominal pain, melena, hematochezia, severe indigestion/heartburn, hematuria, incontinence, muscle weakness, suspicious skin lesions, transient blindness, difficulty walking, depression, unusual weight change, abnormal bleeding, enlarged lymph nodes, and angioedema.     Objective:   Physical Exam     WD, Thin, 67 y/o WF in NAD... she is 5'11"Tall and 141# = BMI~20... Vital Signs:  Reviewed... GENERAL:  Alert & oriented; pleasant & cooperative... HEENT:  [place]/AT, EOM-wnl, PERRLA, EACs-clear, TMs-wnl, NOSE-clear, THROAT-clear & wnl. NECK:  Supple w/ fair ROM; no JVD;  normal carotid impulses w/o bruits, left CAE scar; no thyromegaly or nodules palpated; no lymphadenopathy. CHEST:  Few bibasilar rhonchi, no wheezing/ rales/ or signs of consolidation... HEART:  Regular Rhythm; pacer on left, without murmurs/ rubs/ or gallops detected... ABDOMEN:  Soft & nontender; normal bowel sounds; no organomegaly or masses palpated... EXT: without deformities or arthritic changes; no varicose veins/ venous insuffic/ or edema. NEURO:  CN's intact;  no focal neuro deficits... DERM:  s/p skin cancer removed from left leg; min rash noted...  RADIOLOGY DATA:  Reviewed in the EPIC EMR & discussed w/ the patient...  LABORATORY DATA:  Reviewed in the EPIC EMR & discussed w/ the patient...   Assessment & Plan:    MVP/ MV Repair> Tachy-Brady/ PACER> Dysautonomia>  Followed by Cherly Hensen & DrKlein, doing well on Eliquis & Sectral, continue same... 12/27/16>  Amy Lane is stable & advised to continue current meds the same;  She will remain active, continue nutritional supplements, call for any problems; we plan f/u 40mo 07/11/17>   Amy Lane had a neuro scare but recovered & has a diagnosis- felt to be a migraine  equiv & DrWillis has started Topamax as a preventive, hopefully this will work well for her;  Rec to continue her other meds the sam, continue her ecercise program, & call for any questions   Cerebrovasc Dis>  Off ASA now that she's on EliquisBid, s/p left CAE 11/05, no recurrent cerebral ischemic symptoms & CDopplers stable...  ?DVT while in [place]> moot issue at present; prev on Coumadin now Eliquis; leg swelling diminished on the compression hose, no salt, elevation, etc; she is back to her exercise program & back to baseline...  CHOL>  She feels much better off the Lipitor & FLP improved w/ Zetia10 started 1/15 [facility] continue same...  Hypothyroid>  Stable on the Synthroid 51mcg/d...  GYN>  Stable & followed both here & at [facility] (see above)...  EHLERS-DANLOS>  Aware, stable, she has been feeling better & in great spirits...  Migraines, ?TIA, ?MS, mild cognitive impairment> [facility] 6/13 & 1/15 w/ ?TIA but DrWillis thought likely Migraine syndrome- resolved, neg work up, no recurrence & they are following... Neuro> she mentions several symtoms that she is concerned about- clumsy, balance off, subjective leg weakness- & she is concerned; therefore refer to Neurology for eval... 5/17> she indicates that DrWillis Dx mild cognitive impairment & treated w/ Aricept10 & she feels much better she says 5/19> she had neuro event & went to ER; eval by DrKirkpatrick & DrWillis felt to be migraine equivalent- placed on Topamax for migraine prevention...  Depression>  Off Prozac20 & on Celexa20 & Ativan prn; she & husb received counseling...  Other medical problems as noted... Routine f/u & CPX 06/12/16>   For her AR, allergy symptoms we will Rx w/ the addition of Dymista & Pred Dosepak;  Her TSH is elev & I suspect compliance vs interference in absorption- rec to take 1st thing in the AM by itself & do not eat or drink for 71min, we will recheck TSH in 12mo;  Routine check of Vit D level is low at 24 & she is  rec to start VitD 2000u/d OTC supplement... 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   Patient's Medications  New Prescriptions   No medications on file  Previous Medications   ACEBUTOLOL (SECTRAL) 200 MG CAPSULE    TAKE 1 CAPSULE BY MOUTH EVERYDAY AT BEDTIME   AZELASTINE-FLUTICASONE (DYMISTA) 137-50 MCG/ACT SUSP    1-2 sprays in each nostril BID as needed   CITALOPRAM (CELEXA) 20 MG TABLET    Take 1 tablet (20 mg total) by mouth daily.   DONEPEZIL (ARICEPT) 5 MG TABLET    Take 1 tablet (5 mg total) by mouth at bedtime.   ELIQUIS 5 MG TABS TABLET    TAKE 1 TABLET TWICE A DAY   EZETIMIBE (ZETIA) 10 MG TABLET    TAKE 1 TABLET BY MOUTH EVERY DAY   IPRATROPIUM (ATROVENT) 0.06 % NASAL SPRAY    Place 2 sprays into both nostrils 2 (two) times daily.   LINACLOTIDE (LINZESS) 72 MCG CAPSULE    Take 1 capsule (72 mcg total) by mouth daily before breakfast.   LORAZEPAM (ATIVAN) 1 MG TABLET    TAKE 1 TABLET BY MOUTH 3 TIMES A DAY AS NEEDED   TOPIRAMATE (TOPAMAX) 25 MG TABLET    Take one tablet at night for one week, then take 2 tablets at night  Modified Medications   Modified Medication Previous Medication   LEVOTHYROXINE (SYNTHROID, LEVOTHROID) 88 MCG TABLET levothyroxine (SYNTHROID, LEVOTHROID) 88 MCG tablet      Take 1 tablet (88 mcg total) by mouth daily before breakfast.    TAKE 1 TABLET (88 MCG TOTAL) BY MOUTH DAILY BEFORE BREAKFAST.  Discontinued Medications   No medications on file

## 2017-07-11 NOTE — Patient Instructions (Signed)
Today we updated your med list in our EPIC system...    Continue your current medications the same...  We reviewed your recent history & labs/ XRays/ Scans...  Please return to our lab in the AM for your FASTING lipid profile...  Keep up the great work at [employer]- with your exercise program...  Call for any questions...  Let's plan a follow up visit in 5-58mo, sooner if needed for problems.Marland KitchenMarland Kitchen

## 2017-07-13 ENCOUNTER — Other Ambulatory Visit (INDEPENDENT_AMBULATORY_CARE_PROVIDER_SITE_OTHER): Payer: 59

## 2017-07-13 DIAGNOSIS — E7800 Pure hypercholesterolemia, unspecified: Secondary | ICD-10-CM

## 2017-07-13 LAB — LIPID PANEL
CHOLESTEROL: 166 mg/dL (ref 0–200)
HDL: 56.2 mg/dL (ref >39.00)
LDL Cholesterol: 93 mg/dL (ref 0–99)
NONHDL: 109.36
Total CHOL/HDL Ratio: 3
Triglycerides: 80 mg/dL (ref 0.0–149.0)
VLDL: 16 mg/dL (ref 0.0–40.0)

## 2017-07-19 ENCOUNTER — Encounter: Payer: Self-pay | Admitting: Cardiovascular Disease

## 2017-07-23 ENCOUNTER — Other Ambulatory Visit: Payer: Self-pay | Admitting: Neurology

## 2017-07-25 NOTE — Progress Notes (Signed)
Patient ID: Amy Lane, female   DOB: 1950-08-07, 67 y.o.   MRN: [id number] 66 y.o. female history of bradycardia post pacer  She has been seeing Dr Amy Lane  status post generator replacement and lead repair March 2011. Remote transmissions have shown normal pacer function   She also has a history of Ehlers-Danlos syndrome in the context of a marfanoid habitus, mitral valve prolapse status post mitral valve repair at the [facility] clinic with prior TIA on chronic anticoagulation  as well as syncope due to diagnosis of long QT syndrome. SHe has had significant dysautonomic symptoms consistent with POTS S/P left CEA with duplex 03/18/15 reviewed and 1-39% plaque no stenosis   Cognitive defect better on aricept  ? Of MS with LE weakness Unable to do MRI due to pacer Seen by Dr Amy Lane Recent left visual field cut but  Carotids normal.  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  All other systems reviewed and negative  General: BP 104/70   Pulse 72   Ht 5\' 11"  (1.803 m)   Wt 135 lb 8 oz (61.5 kg)   SpO2 97%   BMI 18.90 kg/m  Affect appropriate Healthy:  appears stated age 6: normal Neck supple with no adenopathy JVP normal post left CEA  no thyromegaly Lungs clear with no wheezing and good diaphragmatic motion Heart:  S1/S2 no murmur, no rub, gallop or click PMI normal  sternotomy Pacer under left clavicle  Abdomen: benighn, BS positve, no tenderness, no AAA no bruit.  No HSM or HJR Distal pulses intact with no bruits No edema Neuro non-focal Skin warm and dry No muscular weakness    Current Outpatient Medications  Medication Sig Dispense Refill  . acebutolol (SECTRAL) 200 MG capsule TAKE 1 CAPSULE BY MOUTH EVERYDAY AT BEDTIME 90 capsule 2  . apixaban (ELIQUIS) 5 MG TABS tablet Take 5 mg by mouth as directed.    . Azelastine-Fluticasone 137-50 MCG/ACT SUSP Place into the nose as directed.    . citalopram (CELEXA) 20 MG tablet Take 1 tablet (20 mg total) by mouth daily. 90 tablet 3  . donepezil (ARICEPT) 5 MG tablet TAKE 1 TABLET DAILY AT BEDTIME 90 tablet 3  . ezetimibe (ZETIA) 10 MG tablet TAKE 1 TABLET BY MOUTH EVERY DAY 90 tablet 1  . ipratropium (ATROVENT) 0.06 % nasal spray Place 2 sprays into both nostrils 2 (two) times daily. 13 mL 2  . levothyroxine (SYNTHROID, LEVOTHROID) 88 MCG tablet Take 1 tablet (88 mcg total) by mouth daily before breakfast. 30 tablet 2  . linaclotide (LINZESS) 72 MCG capsule Take 1 capsule (72 mcg total) by mouth daily before breakfast. 30 capsule 11  . LORazepam (ATIVAN) 1 MG tablet Take 1 mg by mouth as directed.    . topiramate (TOPAMAX) 25 MG capsule Take 25 mg by mouth as directed.     No current facility-administered medications for this  visit.     Allergies  Other; Statins; Adhesive [tape]; and Latex  Electrocardiogram:  Atrial pacing rate 77 somewhat long delay from P wave and atrial spike  ICRBBB normal ventricular complex 03/19/15  A pacing rate 73  RBBB 12/11/16  A paced RBBB   Assessment and Plan  Pacer:  Reviewed Dr Aquilla Hacker last note normal function normal remote transmission    POTS:  Improved with no postural symptoms    TIA:  CEA patent by duplex  Now on Eliquis with no bleeding issues ? Related to PAF  MVrepair:  No murmur on exam echo 04/11/16 repair looks great with normal EF   Thyroid:   Synthroid dose needs to be decreased f/u with primary  Lab Results  Component Value Date   TSH 0.177 (L) 06/20/2017   Chol:   Lab Results  Component Value Date   LDLCALC 93 07/13/2017   Neuro:  Continue aricept for cognitive dysfunction F//U DR Amy Lane Migraine with TIA like symptoms now on Topamax   Ortho:  Patellar degeneration post cortisone injection f/u Amy Lane would not encourage surgery unless Pain gets worse   Amy Lane

## 2017-07-27 ENCOUNTER — Telehealth: Payer: Self-pay

## 2017-07-27 ENCOUNTER — Encounter: Payer: Self-pay | Admitting: Cardiovascular Disease

## 2017-07-27 ENCOUNTER — Ambulatory Visit (INDEPENDENT_AMBULATORY_CARE_PROVIDER_SITE_OTHER): Payer: Medicare Other | Admitting: Cardiovascular Disease

## 2017-07-27 VITALS — BP 104/70 | HR 72 | Ht 71.0 in | Wt 135.5 lb

## 2017-07-27 DIAGNOSIS — G459 Transient cerebral ischemic attack, unspecified: Secondary | ICD-10-CM | POA: Diagnosis not present

## 2017-07-27 DIAGNOSIS — I951 Orthostatic hypotension: Secondary | ICD-10-CM

## 2017-07-27 DIAGNOSIS — Z950 Presence of cardiac pacemaker: Secondary | ICD-10-CM | POA: Diagnosis not present

## 2017-07-27 DIAGNOSIS — R000 Tachycardia, unspecified: Secondary | ICD-10-CM

## 2017-07-27 DIAGNOSIS — Z9889 Other specified postprocedural states: Secondary | ICD-10-CM | POA: Diagnosis not present

## 2017-07-27 DIAGNOSIS — I059 Rheumatic mitral valve disease, unspecified: Secondary | ICD-10-CM

## 2017-07-27 DIAGNOSIS — G90A Postural orthostatic tachycardia syndrome (POTS): Secondary | ICD-10-CM

## 2017-07-27 NOTE — Patient Instructions (Signed)

## 2017-07-27 NOTE — Telephone Encounter (Signed)
Left detailed message on patient's phone on who Dr. Johnsie Cancel recommends for patient's surgery. Will send message through [place] as well.

## 2017-08-06 ENCOUNTER — Ambulatory Visit (INDEPENDENT_AMBULATORY_CARE_PROVIDER_SITE_OTHER): Payer: Medicare Other | Admitting: *Deleted

## 2017-08-06 DIAGNOSIS — I495 Sick sinus syndrome: Secondary | ICD-10-CM

## 2017-08-06 NOTE — Progress Notes (Signed)
Remote pacemaker transmission.   

## 2017-08-07 LAB — CUP PACEART REMOTE DEVICE CHECK
Battery Impedance: 752 Ohm
Battery Voltage: 2.78 V
Brady Statistic AP VS Percent: 80 %
Brady Statistic AS VP Percent: 0 %
Implantable Lead Implant Date: 19981106
Implantable Lead Location: 753859
Implantable Lead Location: 753860
Implantable Lead Model: 5076
Lead Channel Pacing Threshold Amplitude: 0.75 V
Lead Channel Pacing Threshold Pulse Width: 0.4 ms
Lead Channel Pacing Threshold Pulse Width: 0.4 ms
Lead Channel Setting Pacing Amplitude: 2.5 V
Lead Channel Setting Pacing Pulse Width: 0.4 ms
MDC IDC LEAD IMPLANT DT: 20031031
MDC IDC MSMT BATTERY REMAINING LONGEVITY: 74 mo
MDC IDC MSMT LEADCHNL RA IMPEDANCE VALUE: 424 Ohm
MDC IDC MSMT LEADCHNL RV IMPEDANCE VALUE: 708 Ohm
MDC IDC MSMT LEADCHNL RV PACING THRESHOLD AMPLITUDE: 0.875 V
MDC IDC PG IMPLANT DT: 20110310
MDC IDC SESS DTM: 20190715132828
MDC IDC SET LEADCHNL RA PACING AMPLITUDE: 2 V
MDC IDC SET LEADCHNL RV SENSING SENSITIVITY: 2.8 mV
MDC IDC STAT BRADY AP VP PERCENT: 0 %
MDC IDC STAT BRADY AS VS PERCENT: 20 %

## 2017-08-08 ENCOUNTER — Encounter: Payer: Self-pay | Admitting: Cardiology

## 2017-08-15 DIAGNOSIS — L821 Other seborrheic keratosis: Secondary | ICD-10-CM | POA: Diagnosis not present

## 2017-08-15 DIAGNOSIS — D229 Melanocytic nevi, unspecified: Secondary | ICD-10-CM | POA: Diagnosis not present

## 2017-08-15 DIAGNOSIS — Z85828 Personal history of other malignant neoplasm of skin: Secondary | ICD-10-CM | POA: Diagnosis not present

## 2017-08-15 DIAGNOSIS — L814 Other melanin hyperpigmentation: Secondary | ICD-10-CM | POA: Diagnosis not present

## 2017-08-22 ENCOUNTER — Other Ambulatory Visit: Payer: Self-pay | Admitting: Cardiovascular Disease

## 2017-08-22 ENCOUNTER — Other Ambulatory Visit: Payer: Self-pay | Admitting: Pulmonary Disease

## 2017-08-22 NOTE — Telephone Encounter (Signed)
Pt is a 67 yr old female. Who last saw Dr. Johnsie Cancel on 07/27/17, wt at that visit was 61.5Kg. Last SCr was 0.82 on 06/20/17. Will refill Eliquis 5mg  BID.

## 2017-09-15 ENCOUNTER — Other Ambulatory Visit: Payer: Self-pay | Admitting: Neurology

## 2017-09-21 ENCOUNTER — Other Ambulatory Visit: Payer: Self-pay | Admitting: Pulmonary Disease

## 2017-10-02 DIAGNOSIS — Z01419 Encounter for gynecological examination (general) (routine) without abnormal findings: Secondary | ICD-10-CM | POA: Diagnosis not present

## 2017-10-02 DIAGNOSIS — N952 Postmenopausal atrophic vaginitis: Secondary | ICD-10-CM | POA: Diagnosis not present

## 2017-10-02 DIAGNOSIS — Z780 Asymptomatic menopausal state: Secondary | ICD-10-CM | POA: Diagnosis not present

## 2017-10-02 DIAGNOSIS — N9089 Other specified noninflammatory disorders of vulva and perineum: Secondary | ICD-10-CM | POA: Diagnosis not present

## 2017-10-02 DIAGNOSIS — Z1231 Encounter for screening mammogram for malignant neoplasm of breast: Secondary | ICD-10-CM | POA: Diagnosis not present

## 2017-11-02 ENCOUNTER — Telehealth: Payer: Self-pay | Admitting: Pulmonary Disease

## 2017-11-02 MED ORDER — AMOXICILLIN-POT CLAVULANATE 875-125 MG PO TABS: 1.0000 | ORAL_TABLET | Freq: Two times a day (BID) | ORAL | 0 refills | Status: DC

## 2017-11-02 NOTE — Telephone Encounter (Signed)
Per SN- Augmentin 875mg , #14, take 1 by mouth BID. Called and spoke with Patient.  SN recommendations given.  Patient stated understanding.  Augmentin prescription sent to Patient preferred pharmacy, [place]. Nothing further at this time.

## 2017-11-02 NOTE — Telephone Encounter (Signed)
Called and spoke with Patient. She stated that she woke up Sunday, 10/28/17, with non productive cough, fever, chills, and sore throat.  She stated that she used Tylenol cold and flu, for her symptoms.  She felt better Wednesday, but still had a cough.  Her woke up today with a productive cough, with green phlegm, and chest congestion.  She has no fever.  She is requesting a prescription to be sent to [facility].  SN please advise

## 2017-11-02 NOTE — Telephone Encounter (Signed)
   11/02/17 10:53 AM  Note    Called and spoke with Patient. She stated that she woke up Sunday, 10/28/17, with non productive cough, fever, chills, and sore throat.  She stated that she used Tylenol cold and flu, for her symptoms.  She felt better Wednesday, but still had a cough.  Her woke up today with a productive cough, with green phlegm, and chest congestion.  She has no fever.  She is requesting a prescription to be sent to [facility]  SN please advise     Allergies  Allergen Reactions  . Other Other (See Comments)    All QTc prolongation drugs  . Statins Other (See Comments)    Muscle aches  . Adhesive [Tape] Rash  . Latex Rash   Current Outpatient Medications on File Prior to Visit  Medication Sig Dispense Refill  . acebutolol (SECTRAL) 200 MG capsule TAKE 1 CAPSULE BY MOUTH EVERYDAY AT BEDTIME 90 capsule 2  . apixaban (ELIQUIS) 5 MG TABS tablet Take 5 mg by mouth as directed.    . Azelastine-Fluticasone 137-50 MCG/ACT SUSP Place into the nose as directed.    . citalopram (CELEXA) 20 MG tablet Take 1 tablet (20 mg total) by mouth daily. 90 tablet 3  . donepezil (ARICEPT) 5 MG tablet TAKE 1 TABLET DAILY AT BEDTIME 90 tablet 3  . ELIQUIS 5 MG TABS tablet TAKE 1 TABLET BY MOUTH TWICE A DAY 180 tablet 3  . ezetimibe (ZETIA) 10 MG tablet TAKE 1 TABLET BY MOUTH EVERY DAY 90 tablet 2  . ipratropium (ATROVENT) 0.06 % nasal spray Place 2 sprays into both nostrils 2 (two) times daily. 13 mL 2  . levothyroxine (SYNTHROID, LEVOTHROID) 88 MCG tablet Take 1 tablet (88 mcg total) by mouth daily before breakfast. 30 tablet 2  . linaclotide (LINZESS) 72 MCG capsule Take 1 capsule (72 mcg total) by mouth daily before breakfast. 30 capsule 11  . LORazepam (ATIVAN) 1 MG tablet TAKE 1 TABLET 3 TIMES A DAY AS NEEDED FOR ANXIETY 90 tablet 0  . topiramate (TOPAMAX) 25 MG capsule Take 25 mg by mouth as directed.    . topiramate (TOPAMAX) 25 MG tablet TAKE ONE TABLET AT NIGHT FOR ONE WEEK, THEN  TAKE 2 TABLETS AT NIGHT 180 tablet 0   No current facility-administered medications on file prior to visit.

## 2017-11-05 ENCOUNTER — Telehealth: Payer: Self-pay | Admitting: Cardiology

## 2017-11-05 ENCOUNTER — Ambulatory Visit (INDEPENDENT_AMBULATORY_CARE_PROVIDER_SITE_OTHER): Payer: Medicare Other | Admitting: *Deleted

## 2017-11-05 DIAGNOSIS — I495 Sick sinus syndrome: Secondary | ICD-10-CM | POA: Diagnosis not present

## 2017-11-05 NOTE — Telephone Encounter (Signed)
LMOVM reminding pt to send remote transmission.   

## 2017-11-05 NOTE — Progress Notes (Signed)
Remote pacemaker transmission.   

## 2017-11-07 ENCOUNTER — Encounter: Payer: Self-pay | Admitting: Neurology

## 2017-11-07 ENCOUNTER — Ambulatory Visit (INDEPENDENT_AMBULATORY_CARE_PROVIDER_SITE_OTHER): Payer: Medicare Other | Admitting: Neurology

## 2017-11-07 VITALS — BP 105/60 | HR 81 | Ht 71.0 in | Wt 134.0 lb

## 2017-11-07 DIAGNOSIS — G43001 Migraine without aura, not intractable, with status migrainosus: Secondary | ICD-10-CM | POA: Diagnosis not present

## 2017-11-07 NOTE — Progress Notes (Signed)
Reason for visit: Migraine headache  Amy Lane is an 67 y.o. female  History of present illness:  Amy Lane is a 67 year old right-handed white female with a history of Ehlers-Danlos syndrome.  The patient has migraine headaches with neurologic features.  She has done very well on low-dose Topamax, she tolerates the 25 mg nightly dose.  She has not had any migraine headaches on the medication.  She reports no other new medical issues that have come up since last seen.  She is functioning quite well, she believes that the Aricept she is on has if anything improved her memory.  She has the diagnosis of mild cognitive impairment.  She returns to this office for an evaluation.  Past Medical History:  Diagnosis Date  . Abnormal gait   . Allergic rhinitis   . Atrial fibrillation ([place])   . Carcinomas, basal cell   . Cardiac pacemaker    DDD  MDT  . Cerebrovascular disease   . Depression   . Dysautonomia ([place])   . Ehler's-Danlos syndrome   . Fibromyalgia   . Headache(784.0)    Migraine  . Hemorrhoids   . Herpes zoster   . History of syncope   . History of tachycardia-bradycardia syndrome   . History of transient ischemic attack   . Hypercholesteremia   . Hypercholesterolemia   . Hypothyroidism   . Long term (current) use of anticoagulants   . Migraine with aura 06/21/2017   Episodes of aphasia and confusion  . Mitral valve prolapse   . PAT (paroxysmal atrial tachycardia) ([place])   . PSVT (paroxysmal supraventricular tachycardia) ([place])   . Skin desquamation    inflammative vaginitis  . SVT (supraventricular tachycardia) ([facility])     Past Surgical History:  Procedure Laterality Date  . CAROTID ENDARTERECTOMY    . complex mitral valve repair     at the [facility]  . INSERT / REPLACE / REMOVE PACEMAKER    . left carotid endarterectomy  11/2003   by Dr. Delton See  . PACEMAKER INSERTION     medtronic kappa 901  . removed chalazion from left eye lid  09/2011   Dr.  Satira Sark    Family History  Problem Relation Age of Onset  . Lung cancer Mother   . Prostate cancer Father   . Alzheimer's disease Father   . Heart disease Father   . Ehlers-Danlos syndrome Father   . Mitral valve prolapse Brother   . Diabetes Sister   . Mitral valve prolapse Sister   . Leukemia Other        Nephew  . Heart disease Unknown        entire maternal side of family  . Cancer Unknown        entire maternal side of family    Social history:  reports that she has never smoked. She has never used smokeless tobacco. She reports that she does not drink alcohol or use drugs.    Allergies  Allergen Reactions  . Other Other (See Comments)    All QTc prolongation drugs  . Statins Other (See Comments)    Muscle aches  . Adhesive [Tape] Rash  . Latex Rash    Medications:  Prior to Admission medications   Medication Sig Start Date End Date Taking? Authorizing Provider  acebutolol (SECTRAL) 200 MG capsule TAKE 1 CAPSULE BY MOUTH EVERYDAY AT BEDTIME 05/22/17  Yes Josue Hector, MD  amoxicillin-clavulanate (AUGMENTIN) 875-125 MG tablet Take 1 tablet by  mouth 2 (two) times daily. 11/02/17  Yes Noralee Space, MD  apixaban (ELIQUIS) 5 MG TABS tablet Take 5 mg by mouth 2 (two) times daily.    Yes [provider]  Azelastine-Fluticasone 137-50 MCG/ACT SUSP Place into the nose as directed.   Yes [provider]  citalopram (CELEXA) 20 MG tablet Take 1 tablet (20 mg total) by mouth daily. 03/20/17  Yes Noralee Space, MD  donepezil (ARICEPT) 5 MG tablet TAKE 1 TABLET DAILY AT BEDTIME 07/23/17  Yes Kathrynn Ducking, MD  ELIQUIS 5 MG TABS tablet TAKE 1 TABLET BY MOUTH TWICE A DAY 08/22/17  Yes Josue Hector, MD  ezetimibe (ZETIA) 10 MG tablet TAKE 1 TABLET BY MOUTH EVERY DAY 08/22/17  Yes Noralee Space, MD  ipratropium (ATROVENT) 0.06 % nasal spray Place 2 sprays into both nostrils 2 (two) times daily. 04/11/17  Yes Noralee Space, MD  levothyroxine (SYNTHROID,  LEVOTHROID) 88 MCG tablet Take 1 tablet (88 mcg total) by mouth daily before breakfast. 07/11/17  Yes Noralee Space, MD  linaclotide [facility]) 72 MCG capsule Take 1 capsule (72 mcg total) by mouth daily before breakfast. 06/16/15  Yes Milus Banister, MD  LORazepam (ATIVAN) 1 MG tablet TAKE 1 TABLET 3 TIMES A DAY AS NEEDED FOR ANXIETY 09/21/17  Yes Noralee Space, MD  topiramate (TOPAMAX) 25 MG capsule Take 25 mg by mouth at bedtime.   Yes [provider]    ROS:  Out of a complete 14 system review of symptoms, the patient complains only of the following symptoms, and all other reviewed systems are negative.  Memory loss Headache  Blood pressure 105/60, pulse 81, height 5\' 11"  (1.803 m), weight 134 lb (60.8 kg), SpO2 96 %.  Physical Exam  General: The patient is alert and cooperative at the time of the examination.  Skin: No significant peripheral edema is noted.   Neurologic Exam  Mental status: The patient is alert and oriented x 3 at the time of the examination. The patient has apparent normal recent and remote memory, with an apparently normal attention span and concentration ability.   Cranial nerves: Facial symmetry is present. Speech is normal, no aphasia or dysarthria is noted. Extraocular movements are full. Visual fields are full.  Motor: The patient has good strength in all 4 extremities.  Sensory examination: Soft touch sensation is symmetric on the face, arms, and legs.  Coordination: The patient has good finger-nose-finger and heel-to-shin bilaterally.  Gait and station: The patient has a normal gait. Tandem gait is slightly unsteady. Romberg is negative. No drift is seen.  Reflexes: Deep tendon reflexes are symmetric.   Assessment/Plan:  1.  Ehlers-Danlos syndrome  2.  Migraine headache with neurologic features  3.  Mild cognitive impairment  The patient will continue the Aricept and the Topamax for now.  She will follow-up in 1 year, she seems  to be doing quite well at this time.  Amy Alexanders MD 11/07/2017 12:04 PM  [facility] [address] [place], [place] [address]  Phone [phone] Fax [phone]

## 2017-11-08 ENCOUNTER — Encounter: Payer: Self-pay | Admitting: Cardiology

## 2017-11-16 ENCOUNTER — Ambulatory Visit (INDEPENDENT_AMBULATORY_CARE_PROVIDER_SITE_OTHER): Payer: Medicare Other | Admitting: Pulmonary Disease

## 2017-11-16 ENCOUNTER — Encounter: Payer: Self-pay | Admitting: Pulmonary Disease

## 2017-11-16 ENCOUNTER — Ambulatory Visit: Payer: Medicare Other | Admitting: Physician Assistant

## 2017-11-16 ENCOUNTER — Ambulatory Visit: Payer: Medicare Other | Admitting: Nurse Practitioner

## 2017-11-16 ENCOUNTER — Ambulatory Visit (INDEPENDENT_AMBULATORY_CARE_PROVIDER_SITE_OTHER)
Admission: RE | Admit: 2017-11-16 | Discharge: 2017-11-16 | Disposition: A | Discharge status: Home / Self Care | Payer: Medicare Other | Source: Ambulatory Visit | Attending: Pulmonary Disease | Admitting: Pulmonary Disease

## 2017-11-16 ENCOUNTER — Ambulatory Visit: Payer: Medicare Other | Admitting: Primary Care

## 2017-11-16 VITALS — BP 96/58 | HR 90 | Temp 97.7°F | Ht 71.0 in | Wt 133.2 lb

## 2017-11-16 DIAGNOSIS — J209 Acute bronchitis, unspecified: Secondary | ICD-10-CM

## 2017-11-16 DIAGNOSIS — H6983 Other specified disorders of Eustachian tube, bilateral: Secondary | ICD-10-CM

## 2017-11-16 DIAGNOSIS — R05 Cough: Secondary | ICD-10-CM | POA: Diagnosis not present

## 2017-11-16 DIAGNOSIS — J42 Unspecified chronic bronchitis: Secondary | ICD-10-CM | POA: Diagnosis not present

## 2017-11-16 DIAGNOSIS — H6993 Unspecified Eustachian tube disorder, bilateral: Secondary | ICD-10-CM | POA: Insufficient documentation

## 2017-11-16 DIAGNOSIS — J309 Allergic rhinitis, unspecified: Secondary | ICD-10-CM

## 2017-11-16 MED ORDER — AZELASTINE-FLUTICASONE 137-50 MCG/ACT NA SUSP: 1.0000 | NASAL | 3 refills | Status: DC

## 2017-11-16 NOTE — Progress Notes (Signed)
@Patient  ID: Amy Lane, female    DOB: March 30, 1950, 67 y.o.   MRN: [id number]  Chief Complaint  Patient presents with  . Acute Visit    sob, cough    Referring provider: Noralee Space, MD  HPI:  67 y/o female former smoker followed in our office by Dr. Lenna Gilford >>>she is followed regularly by Drs Maryellen Pile for Cardiology, and Drs Jeri Modena for GI...  PMH: MVP, Afib, syncope Smoker/ Smoking History: Former smoker Maintenance:  none Pt of: Dr. Lenna Gilford   11/16/2017  - Visit   67 year old female patient presenting today for acute visit.  Patient reporting that about 4 weeks ago patient started having a productive cough with sputum that was yellow-green mucus.  She was also having fever, chills, night sweats fatigue, increased shortness of breath.  She contacted our office was treated with Augmentin over the telephone.  Patient reports that after being treated with Augmentin sputum culture transition from green to a white.  Patient reports that the cough improved significantly as well was she did no longer had fevers, chills, night sweats.  Unfortunately on 11/13/2017 cough recurred was more of a dry cough.  Patient reports her fatigue persists.  Patient scheduled an appointment to be further evaluated as she is concerned she is not getting better. Patient does admit to nasal congestion she is not on any daily allergy medications at this time.  Patient reports that she has nasal sprays that she has not been using for management of symptoms.  She is requesting a refill of these.  Patient does report that her cough is actually the best is been weeks today.  Patient also reports she feels like some of her symptoms have gotten significantly better she could be on the mend.  Patient would just like to be evaluated today to ensure she is doing well.     Tests:    FENO:  No results found for: NITRICOXIDE  PFT: No flowsheet data found.  Imaging: Dg Chest 2 View  Result Date:  11/16/2017 CLINICAL DATA:  Chronic bronchitis, unspecified chronic bronchitis type. Cough and fever. EXAM: CHEST - 2 VIEW COMPARISON:  07/25/2016 FINDINGS: Stable appearance of the left dual chamber pacemaker. Median sternotomy wires are stable. Heart size is normal. Evidence for calcifications in the left mediastinum. Stable scarring at the left lung apex. No new lung disease or pulmonary edema. No pleural effusions. No acute bone abnormality. IMPRESSION: No active cardiopulmonary disease. Electronically Signed   By: Markus Daft M.D.   On: 11/16/2017 10:52    Chart Review:    Specialty Problems      Pulmonary Problems   Allergic rhinitis    Complicated by eustachian tube dysfunction > see OV 08/05/2010 /[place]       Acute bronchitis      Allergies  Allergen Reactions  . Other Other (See Comments)    All QTc prolongation drugs  . Statins Other (See Comments)    Muscle aches  . Adhesive [Tape] Rash  . Latex Rash    Immunization History  Administered Date(s) Administered  . Influenza Split 11/21/2010, 11/21/2011  . Influenza Whole 02/14/2007, 10/16/2007, 10/13/2009  . Influenza,inj,Quad PF,6+ Mos 11/19/2012, 10/06/2013, 11/30/2014, 11/29/2015, 10/03/2016  . Pneumococcal Conjugate-13 05/31/2015  . Pneumococcal Polysaccharide-23 01/24/2003  . Td 04/16/2009    Past Medical History:  Diagnosis Date  . Abnormal gait   . Allergic rhinitis   . Atrial fibrillation ([place])   . Carcinomas, basal cell   .  Cardiac pacemaker    DDD  MDT  . Cerebrovascular disease   . Depression   . Dysautonomia ([name])   . Ehler's-Danlos syndrome   . Fibromyalgia   . Headache(784.0)    Migraine  . Hemorrhoids   . Herpes zoster   . History of syncope   . History of tachycardia-bradycardia syndrome   . History of transient ischemic attack   . Hypercholesteremia   . Hypercholesterolemia   . Hypothyroidism   . Long term (current) use of anticoagulants   . Migraine with aura 06/21/2017   Episodes  of aphasia and confusion  . Mitral valve prolapse   . PAT (paroxysmal atrial tachycardia) ([place])   . PSVT (paroxysmal supraventricular tachycardia) ([place])   . Skin desquamation    inflammative vaginitis  . SVT (supraventricular tachycardia) ([facility])     Tobacco History: Social History   Tobacco Use  Smoking Status Never Smoker  Smokeless Tobacco Never Used  Tobacco Comment   smoked in her early 20's--smoked 4 cigs per year    Counseling given: Not Answered Comment: smoked in her early 20's--smoked 4 cigs per year   Continue to not smoke  Outpatient Encounter Medications as of 11/16/2017  Medication Sig  . acebutolol (SECTRAL) 200 MG capsule TAKE 1 CAPSULE BY MOUTH EVERYDAY AT BEDTIME  . Azelastine-Fluticasone 137-50 MCG/ACT SUSP Place 1 spray into the nose as directed.  . citalopram (CELEXA) 20 MG tablet Take 1 tablet (20 mg total) by mouth daily.  Marland Kitchen donepezil (ARICEPT) 5 MG tablet TAKE 1 TABLET DAILY AT BEDTIME  . ELIQUIS 5 MG TABS tablet TAKE 1 TABLET BY MOUTH TWICE A DAY  . ezetimibe (ZETIA) 10 MG tablet TAKE 1 TABLET BY MOUTH EVERY DAY  . ipratropium (ATROVENT) 0.06 % nasal spray Place 2 sprays into both nostrils 2 (two) times daily.  Marland Kitchen levothyroxine (SYNTHROID, LEVOTHROID) 88 MCG tablet Take 1 tablet (88 mcg total) by mouth daily before breakfast.  . linaclotide (LINZESS) 72 MCG capsule Take 1 capsule (72 mcg total) by mouth daily before breakfast.  . LORazepam (ATIVAN) 1 MG tablet TAKE 1 TABLET 3 TIMES A DAY AS NEEDED FOR ANXIETY  . topiramate (TOPAMAX) 25 MG capsule Take 25 mg by mouth at bedtime.  . [DISCONTINUED] apixaban (ELIQUIS) 5 MG TABS tablet Take 5 mg by mouth 2 (two) times daily.   . [DISCONTINUED] Azelastine-Fluticasone 137-50 MCG/ACT SUSP Place into the nose as directed.  . [DISCONTINUED] amoxicillin-clavulanate (AUGMENTIN) 875-125 MG tablet Take 1 tablet by mouth 2 (two) times daily. (Patient not taking: Reported on 11/16/2017)   No facility-administered  encounter medications on file as of 11/16/2017.      Review of Systems  Review of Systems  Constitutional: Positive for fatigue. Negative for chills, fever and unexpected weight change.  HENT: Positive for congestion and ear pain (ear fullness ). Negative for postnasal drip, sinus pressure, sinus pain and sore throat.   Respiratory: Positive for cough. Negative for chest tightness, shortness of breath and wheezing.   Cardiovascular: Negative for chest pain and palpitations.  Gastrointestinal: Positive for diarrhea. Negative for blood in stool, nausea and vomiting.  Genitourinary: Negative for dysuria, frequency and urgency.  Musculoskeletal: Negative for arthralgias.  Skin: Negative for color change.  Allergic/Immunologic: Positive for environmental allergies. Negative for food allergies.  Neurological: Negative for dizziness, light-headedness and headaches.  Psychiatric/Behavioral: Negative for dysphoric mood. The patient is nervous/anxious.   All other systems reviewed and are negative.    Physical Exam  BP (!) 96/58 (BP  Location: Left Arm, Cuff Size: Normal)   Pulse 90   Temp 97.7 F (36.5 C) (Oral)   Ht 5\' 11"  (1.803 m)   Wt 133 lb 3.2 oz (60.4 kg)   SpO2 97%   BMI 18.58 kg/m   Wt Readings from Last 5 Encounters:  11/16/17 133 lb 3.2 oz (60.4 kg)  11/07/17 134 lb (60.8 kg)  07/27/17 135 lb 8 oz (61.5 kg)  07/11/17 135 lb 9.6 oz (61.5 kg)  06/21/17 137 lb (62.1 kg)     Physical Exam  Constitutional: She is oriented to person, place, and time and well-developed, well-nourished, and in no distress. No distress.  +hypotensive  HENT:  Head: Normocephalic and atraumatic.  Right Ear: Hearing, external ear and ear canal normal.  Left Ear: Hearing, external ear and ear canal normal.  Nose: Mucosal edema and rhinorrhea present. Right sinus exhibits no maxillary sinus tenderness and no frontal sinus tenderness. Left sinus exhibits no maxillary sinus tenderness and no frontal  sinus tenderness.  Mouth/Throat: Uvula is midline and oropharynx is clear and moist. No oropharyngeal exudate.  + TMs with effusion bilaterally without infection, +PND  Eyes: Pupils are equal, round, and reactive to light.  Neck: Normal range of motion. Neck supple. No JVD present.  Cardiovascular: Normal rate, regular rhythm and normal heart sounds.  Pulmonary/Chest: Effort normal and breath sounds normal. No accessory muscle usage. No respiratory distress. She has no decreased breath sounds. She has no wheezes. She has no rhonchi.  +deep breath ellicits cough   Abdominal: Soft. Bowel sounds are normal. There is no tenderness.  Musculoskeletal: Normal range of motion. She exhibits no edema.  Lymphadenopathy:    She has no cervical adenopathy.  Neurological: She is alert and oriented to person, place, and time. Gait normal.  Skin: Skin is warm and dry. She is not diaphoretic. No erythema.  Psychiatric: Memory, affect and judgment normal. Her mood appears anxious.  Nursing note and vitals reviewed.     Lab Results:  CBC    Component Value Date/Time   WBC 8.3 06/20/2017 0458   RBC 3.74 (L) 06/20/2017 0458   HGB 11.4 (L) 06/20/2017 0458   HCT 33.2 (L) 06/20/2017 0458   PLT 155 06/20/2017 0458   MCV 88.8 06/20/2017 0458   MCH 30.5 06/20/2017 0458   MCHC 34.3 06/20/2017 0458   RDW 12.7 06/20/2017 0458   LYMPHSABS 1.2 06/19/2017 1930   MONOABS 0.7 06/19/2017 1930   EOSABS 0.1 06/19/2017 1930   BASOSABS 0.0 06/19/2017 1930    BMET    Component Value Date/Time   NA 131 (L) 06/20/2017 0458   K 4.1 06/20/2017 0458   CL 101 06/20/2017 0458   CO2 21 (L) 06/20/2017 0458   GLUCOSE 112 (H) 06/20/2017 0458   BUN 11 06/20/2017 0458   CREATININE 0.82 06/20/2017 0458   CALCIUM 8.5 (L) 06/20/2017 0458   GFRNONAA >60 06/20/2017 0458   GFRAA >60 06/20/2017 0458    BNP No results found for: BNP  ProBNP No results found for: PROBNP    Assessment & Plan:   67 year old female  patient.  Patient with resolving bronchitis.  Will encourage patient start daily antihistamine.  Patient to keep follow-up with Dr. Lenna Gilford in December/2019.  Allergic rhinitis  Refilled nasal spray  Consider starting daily antihistamine such as Zyrtec >>>Choose generic >>>Take daily  Receive flu vaccine next month can make nurse appointment here will need high-dose flu vaccine  Keep follow-up with Dr. Lenna Gilford in December/2019  Contact our office sooner if you are having any concerns regarding your breathing or symptoms worsen  Eustachian tube dysfunction, bilateral  Receive flu vaccine next month can make nurse appointment here will need high-dose flu vaccine  Keep follow-up with Dr. Lenna Gilford in [place] our office sooner if you are having any concerns regarding your breathing or symptoms worsen  Acute bronchitis Chest x-ray today  Refilled nasal spray  Consider starting daily antihistamine such as Zyrtec >>>Choose generic >>>Take daily  Receive flu vaccine next month can make nurse appointment here will need high-dose flu vaccine  Keep follow-up with Dr. Lenna Gilford in [place], NP 11/16/2017

## 2017-11-16 NOTE — Progress Notes (Signed)
Your chest x-ray results of come back.  Showing no acute changes.  No plan of care changes at this time.  Keep follow-up appointment.    Follow-up with our office if symptoms worsen or you do not feel like you are improving under her current regimen.  It was a pleasure taking care of you,  Brian Mack, FNP 

## 2017-11-16 NOTE — Assessment & Plan Note (Signed)
  Receive flu vaccine next month can make nurse appointment here will need high-dose flu vaccine  Keep follow-up with Dr. Lenna Gilford in [place] our office sooner if you are having any concerns regarding your breathing or symptoms worsen

## 2017-11-16 NOTE — Assessment & Plan Note (Signed)
  Refilled nasal spray  Consider starting daily antihistamine such as Zyrtec >>>Choose generic >>>Take daily  Receive flu vaccine next month can make nurse appointment here will need high-dose flu vaccine  Keep follow-up with Dr. Lenna Gilford in [place] our office sooner if you are having any concerns regarding your breathing or symptoms worsen

## 2017-11-16 NOTE — Patient Instructions (Signed)
Chest x-ray today  Refilled nasal spray  Consider starting daily antihistamine such as Zyrtec >>>Choose generic >>>Take daily  Receive flu vaccine next month can make nurse appointment here will need high-dose flu vaccine  Keep follow-up with Dr. Lenna Gilford in [place] our office sooner if you are having any concerns regarding your breathing or symptoms worsen  November/2019 we will be moving! We will no longer be at our [place] location.  Be on the look out for a post card/mailer to let you know we have officially moved.  Our new address and phone number will be:  [address] [place],  [id number] Telephone number: [phone]  It is flu season:   >>>Remember to be washing your hands regularly, using hand sanitizer, be careful to use around herself with has contact with people who are sick will increase her chances of getting sick yourself. >>> Best ways to protect herself from the flu: Receive the yearly flu vaccine, practice good hand hygiene washing with soap and also using hand sanitizer when available, eat a nutritious meals, get adequate rest, hydrate appropriately   Please contact the office if your symptoms worsen or you have concerns that you are not improving.   Thank you for choosing [facility] for your healthcare, and for allowing [place] to partner with you on your healthcare journey. I am thankful to be able to provide care to you today.   Wyn Quaker FNP-C      Acute Bronchitis, Adult Acute bronchitis is sudden (acute) swelling of the air tubes (bronchi) in the lungs. Acute bronchitis causes these tubes to fill with mucus, which can make it hard to breathe. It can also cause coughing or wheezing. In adults, acute bronchitis usually goes away within 2 weeks. A cough caused by bronchitis may last up to 3 weeks. Smoking, allergies, and asthma can make the condition worse. Repeated episodes of bronchitis may cause further lung problems,  such as chronic obstructive pulmonary disease (COPD). What are the causes? This condition can be caused by germs and by substances that irritate the lungs, including:  Cold and flu viruses. This condition is most often caused by the same virus that causes a cold.  Bacteria.  Exposure to tobacco smoke, dust, fumes, and air pollution.  What increases the risk? This condition is more likely to develop in people who:  Have close contact with someone with acute bronchitis.  Are exposed to lung irritants, such as tobacco smoke, dust, fumes, and vapors.  Have a weak immune system.  Have a respiratory condition such as asthma.  What are the signs or symptoms? Symptoms of this condition include:  A cough.  Coughing up clear, yellow, or green mucus.  Wheezing.  Chest congestion.  Shortness of breath.  A fever.  Body aches.  Chills.  A sore throat.  How is this diagnosed? This condition is usually diagnosed with a physical exam. During the exam, your health care provider may order tests, such as chest X-rays, to rule out other conditions. He or she may also:  Test a sample of your mucus for bacterial infection.  Check the level of oxygen in your blood. This is done to check for pneumonia.  Do a chest X-ray or lung function testing to rule out pneumonia and other conditions.  Perform blood tests.  Your health care provider will also ask about your symptoms and medical history. How is this treated? Most cases of acute bronchitis clear up over time  without treatment. Your health care provider may recommend:  Drinking more fluids. Drinking more makes your mucus thinner, which may make it easier to breathe.  Taking a medicine for a fever or cough.  Taking an antibiotic medicine.  Using an inhaler to help improve shortness of breath and to control a cough.  Using a cool mist vaporizer or humidifier to make it easier to breathe.  Follow these instructions at  home: Medicines  Take over-the-counter and prescription medicines only as told by your health care provider.  If you were prescribed an antibiotic, take it as told by your health care provider. Do not stop taking the antibiotic even if you start to feel better. General instructions  Get plenty of rest.  Drink enough fluids to keep your urine clear or pale yellow.  Avoid smoking and secondhand smoke. Exposure to cigarette smoke or irritating chemicals will make bronchitis worse. If you smoke and you need help quitting, ask your health care provider. Quitting smoking will help your lungs heal faster.  Use an inhaler, cool mist vaporizer, or humidifier as told by your health care provider.  Keep all follow-up visits as told by your health care provider. This is important. How is this prevented? To lower your risk of getting this condition again:  Wash your hands often with soap and water. If soap and water are not available, use hand sanitizer.  Avoid contact with people who have cold symptoms.  Try not to touch your hands to your mouth, nose, or eyes.  Make sure to get the flu shot every year.  Contact a health care provider if:  Your symptoms do not improve in 2 weeks of treatment. Get help right away if:  You cough up blood.  You have chest pain.  You have severe shortness of breath.  You become dehydrated.  You faint or keep feeling like you are going to faint.  You keep vomiting.  You have a severe headache.  Your fever or chills gets worse. This information is not intended to replace advice given to you by your health care provider. Make sure you discuss any questions you have with your health care provider. Document Released: 02/17/2004 Document Revised: 08/04/2015 Document Reviewed: 06/30/2015 Elsevier Interactive Patient Education  [employer].

## 2017-11-16 NOTE — Assessment & Plan Note (Signed)
Chest x-ray today  Refilled nasal spray  Consider starting daily antihistamine such as Zyrtec >>>Choose generic >>>Take daily  Receive flu vaccine next month can make nurse appointment here will need high-dose flu vaccine  Keep follow-up with Dr. Lenna Gilford in December/2019

## 2017-11-28 ENCOUNTER — Ambulatory Visit: Payer: Medicare Other | Admitting: Physician Assistant

## 2017-11-28 LAB — CUP PACEART REMOTE DEVICE CHECK
Battery Voltage: 2.79 V
Brady Statistic AP VP Percent: 0 %
Brady Statistic AP VS Percent: 81 %
Brady Statistic AS VP Percent: 0 %
Date Time Interrogation Session: 20191014164933
Implantable Lead Implant Date: 19981106
Implantable Pulse Generator Implant Date: 20110310
Lead Channel Impedance Value: 408 Ohm
Lead Channel Pacing Threshold Amplitude: 0.75 V
Lead Channel Pacing Threshold Amplitude: 1 V
Lead Channel Pacing Threshold Pulse Width: 0.4 ms
Lead Channel Setting Pacing Pulse Width: 0.4 ms
MDC IDC LEAD IMPLANT DT: 20031031
MDC IDC LEAD LOCATION: 753859
MDC IDC LEAD LOCATION: 753860
MDC IDC MSMT BATTERY IMPEDANCE: 726 Ohm
MDC IDC MSMT BATTERY REMAINING LONGEVITY: 74 mo
MDC IDC MSMT LEADCHNL RV IMPEDANCE VALUE: 615 Ohm
MDC IDC MSMT LEADCHNL RV PACING THRESHOLD PULSEWIDTH: 0.4 ms
MDC IDC SET LEADCHNL RA PACING AMPLITUDE: 2 V
MDC IDC SET LEADCHNL RV PACING AMPLITUDE: 2.5 V
MDC IDC SET LEADCHNL RV SENSING SENSITIVITY: 2.8 mV
MDC IDC STAT BRADY AS VS PERCENT: 18 %

## 2017-12-11 ENCOUNTER — Ambulatory Visit (INDEPENDENT_AMBULATORY_CARE_PROVIDER_SITE_OTHER): Payer: Medicare Other

## 2017-12-11 DIAGNOSIS — Z23 Encounter for immunization: Secondary | ICD-10-CM

## 2017-12-12 DIAGNOSIS — Z23 Encounter for immunization: Secondary | ICD-10-CM | POA: Diagnosis not present

## 2017-12-13 ENCOUNTER — Other Ambulatory Visit: Payer: Self-pay | Admitting: Neurology

## 2017-12-19 ENCOUNTER — Telehealth: Payer: Self-pay

## 2017-12-19 ENCOUNTER — Ambulatory Visit (INDEPENDENT_AMBULATORY_CARE_PROVIDER_SITE_OTHER): Payer: Medicare Other | Admitting: Physician Assistant

## 2017-12-19 ENCOUNTER — Encounter: Payer: Self-pay | Admitting: Physician Assistant

## 2017-12-19 VITALS — BP 100/68 | HR 63 | Ht 71.0 in | Wt 134.0 lb

## 2017-12-19 DIAGNOSIS — Z1211 Encounter for screening for malignant neoplasm of colon: Secondary | ICD-10-CM

## 2017-12-19 DIAGNOSIS — Z7901 Long term (current) use of anticoagulants: Secondary | ICD-10-CM | POA: Diagnosis not present

## 2017-12-19 MED ORDER — NA SULFATE-K SULFATE-MG SULF 17.5-3.13-1.6 GM/177ML PO SOLN: ORAL | 0 refills | Status: DC

## 2017-12-19 NOTE — Telephone Encounter (Signed)
[facility] Pre-operative Risk Assessment     Request for surgical clearance:     Endoscopy Procedure  What type of surgery is being performed?     Colonoscopy  When is this surgery scheduled?    01/04/18  What type of clearance is required ?   Pharmacy  Are there any medications that need to be held prior to surgery and how long? Eliquis 2 days prior to procedure  Practice name and name of physician performing surgery?      [facility]/Dr Ardis Hughs  What is your office phone and fax number?      Phone- [phone]  [phone]  Anesthesia type (None, local, MAC, general) ?       MAC

## 2017-12-19 NOTE — Patient Instructions (Addendum)
If you are age 65 or older, your body mass index should be between 23-30. Your Body mass index is 18.69 kg/m. If this is out of the aforementioned range listed, please consider follow up with your Primary Care Provider.  If you are age 68 or younger, your body mass index should be between 19-25. Your Body mass index is 18.69 kg/m. If this is out of the aformentioned range listed, please consider follow up with your Primary Care Provider.   You have been scheduled for a colonoscopy. Please follow written instructions given to you at your visit today.  Please pick up your prep supplies at the pharmacy within the next 1-3 days. If you use inhalers (even only as needed), please bring them with you on the day of your procedure. Your physician has requested that you go to www.startemmi.com and enter the access code given to you at your visit today. This web site gives a general overview about your procedure. However, you should still follow specific instructions given to you by our office regarding your preparation for the procedure.  We have sent the following medications to your pharmacy for you to pick up at your convenience: [place] will be contacted by our office prior to your procedure for directions on holding your Eliquis.  If you do not hear from our office 1 week prior to your scheduled procedure, please call [phone] to discuss.   Thank you for choosing me and [facility].   Tye Savoy, NP

## 2017-12-19 NOTE — Telephone Encounter (Signed)
Patient with diagnosis of Afib with history of TIA/DVT on Eliquis for anticoagulation.    Procedure: colonoscopy Date of procedure: 01/04/18  CHADS2-VASc score of  5 (CHF, HTN, AGE, DM2, stroke/tia x 2, CAD, AGE, female)  CrCl 66ml/min  Per office protocol, patient can hold Eliquis for 24 hours prior to procedure.

## 2017-12-19 NOTE — Progress Notes (Signed)
Chief Complaint: Consultation for screening colonoscopy on chronic anticoagulation  HPI:    Amy Lane is a 67 year old female, known to Dr. Ardis Hughs, with a past medical history of A. fib on Eliquis (04/11/2016 echo with an LVEF 60-65%), who was referred to me by Noralee Space, MD for sideration of a screening colonoscopy on chronic anticoagulation.      03/14/2007 colonoscopy with no colon cancers or polyps, only small hemorrhoids.  Repeat recommended in 10 years.    Today, the patient presents to clinic accompanied by her husband and tells me that she has been doing well.  She was chronically constipated on Linzess but was in the hospital in October and tells me that ever since then she has had more normal bowel movements with a solid soft stool every 2 days which is "great for me" and this is without her Linzess.  Does describe some bloating but this is also normal for her.    Describes having to be on Lovenox in the past if she comes off of her Eliquis.    Denies fever, chills, weight loss, Nexium, nausea, vomiting, heartburn, reflux or abdominal pain.  Past Medical History:  Diagnosis Date  . Abnormal gait   . Allergic rhinitis   . Atrial fibrillation ([place])   . Carcinomas, basal cell   . Cardiac pacemaker    DDD  MDT  . Cerebrovascular disease   . Depression   . Dysautonomia ([place])   . Ehler's-Danlos syndrome   . Fibromyalgia   . Headache(784.0)    Migraine  . Hemorrhoids   . Herpes zoster   . History of syncope   . History of tachycardia-bradycardia syndrome   . History of transient ischemic attack   . Hypercholesteremia   . Hypercholesterolemia   . Hypothyroidism   . Long term (current) use of anticoagulants   . Migraine with aura 06/21/2017   Episodes of aphasia and confusion  . Mitral valve prolapse   . PAT (paroxysmal atrial tachycardia) ([place])   . PSVT (paroxysmal supraventricular tachycardia) ([place])   . Skin desquamation    inflammative vaginitis  . SVT  (supraventricular tachycardia) ([facility])     Past Surgical History:  Procedure Laterality Date  . CAROTID ENDARTERECTOMY    . complex mitral valve repair     at the [facility]  . INSERT / REPLACE / REMOVE PACEMAKER    . left carotid endarterectomy  11/2003   by Dr. Delton See  . PACEMAKER INSERTION     medtronic kappa 901  . removed chalazion from left eye lid  09/2011   Dr. Satira Sark    Current Outpatient Medications  Medication Sig Dispense Refill  . acebutolol (SECTRAL) 200 MG capsule TAKE 1 CAPSULE BY MOUTH EVERYDAY AT BEDTIME 90 capsule 2  . Azelastine-Fluticasone 137-50 MCG/ACT SUSP Place 1 spray into the nose as directed. 1 Bottle 3  . citalopram (CELEXA) 20 MG tablet Take 1 tablet (20 mg total) by mouth daily. 90 tablet 3  . donepezil (ARICEPT) 5 MG tablet TAKE 1 TABLET DAILY AT BEDTIME 90 tablet 3  . ELIQUIS 5 MG TABS tablet TAKE 1 TABLET BY MOUTH TWICE A DAY 180 tablet 3  . ezetimibe (ZETIA) 10 MG tablet TAKE 1 TABLET BY MOUTH EVERY DAY 90 tablet 2  . ipratropium (ATROVENT) 0.06 % nasal spray Place 2 sprays into both nostrils 2 (two) times daily. 13 mL 2  . levothyroxine (SYNTHROID, LEVOTHROID) 88 MCG tablet Take 1 tablet (88 mcg total) by  mouth daily before breakfast. 30 tablet 2  . linaclotide (LINZESS) 72 MCG capsule Take 1 capsule (72 mcg total) by mouth daily before breakfast. 30 capsule 11  . LORazepam (ATIVAN) 1 MG tablet TAKE 1 TABLET 3 TIMES A DAY AS NEEDED FOR ANXIETY 90 tablet 0  . topiramate (TOPAMAX) 25 MG tablet Take two tablets at bedtime 180 tablet 0   No current facility-administered medications for this visit.     Allergies as of 12/19/2017 - Review Complete 12/19/2017  Allergen Reaction Noted  . Other Other (See Comments) 11/21/2010  . Statins Other (See Comments) 01/26/2011  . Adhesive [tape] Rash 06/19/2017  . Latex Rash 06/19/2017    Family History  Problem Relation Age of Onset  . Lung cancer Mother   . Prostate cancer Father   . Alzheimer's  disease Father   . Heart disease Father   . Ehlers-Danlos syndrome Father   . Mitral valve prolapse Brother   . Diabetes Sister   . Mitral valve prolapse Sister   . Leukemia Other        Nephew  . Heart disease Unknown        entire maternal side of family  . Cancer Unknown        entire maternal side of family    Social History   Socioeconomic History  . Marital status: Married    Spouse name: Fritz Pickerel  . Number of children: 0  . Years of education: S-College  . Highest education level: Not on file  Occupational History  . Occupation: retired    Fish farm manager: RETIRED  Social Needs  . Financial resource strain: Not on file  . Food insecurity:    Worry: Not on file    Inability: Not on file  . Transportation needs:    Medical: Not on file    Non-medical: Not on file  Tobacco Use  . Smoking status: Never Smoker  . Smokeless tobacco: Never Used  . Tobacco comment: smoked in her early 20's--smoked 4 cigs per year   Substance and Sexual Activity  . Alcohol use: No    Alcohol/week: 0.0 standard drinks    Comment: 2 drinks per year  . Drug use: No  . Sexual activity: Not on file  Lifestyle  . Physical activity:    Days per week: Not on file    Minutes per session: Not on file  . Stress: Not on file  Relationships  . Social connections:    Talks on phone: Not on file    Gets together: Not on file    Attends religious service: Not on file    Active member of club or organization: Not on file    Attends meetings of clubs or organizations: Not on file    Relationship status: Not on file  . Intimate partner violence:    Fear of current or ex partner: Not on file    Emotionally abused: Not on file    Physically abused: Not on file    Forced sexual activity: Not on file  Other Topics Concern  . Not on file  Social History Narrative   Patient drinks about 2 cups of caffeine daily.   Patient is right handed.     Review of Systems:    Constitutional: No weight loss, fever  or chills Skin: No rash  Cardiovascular: No chest pain Respiratory: No SOB  Gastrointestinal: See HPI and otherwise negative Genitourinary: No dysuria  Neurological: No headache, dizziness or syncope Musculoskeletal: No new muscle  or joint pain Hematologic: No bleeding Psychiatric: No history of depression or anxiety   Physical Exam:  Vital signs: BP 100/68   Pulse 63   Ht 5\' 11"  (1.803 m)   Wt 134 lb (60.8 kg)   BMI 18.69 kg/m   Constitutional:   Pleasant Caucasian female appears to be in NAD, Well developed, Well nourished, alert and cooperative Head:  Normocephalic and atraumatic. Eyes:   PEERL, EOMI. No icterus. Conjunctiva pink. Ears:  Normal auditory acuity. Neck:  Supple Throat: Oral cavity and pharynx without inflammation, swelling or lesion.  Respiratory: Respirations even and unlabored. Lungs clear to auscultation bilaterally.   No wheezes, crackles, or rhonchi.  Cardiovascular: Normal S1, S2. No MRG. Regular rate and rhythm. No peripheral edema, cyanosis or pallor.  Gastrointestinal:  Soft, nondistended, nontender. No rebound or guarding. Normal bowel sounds. No appreciable masses or hepatomegaly. Rectal:  Not performed.  Msk:  Symmetrical without gross deformities. Without edema, no deformity or joint abnormality.  Neurologic:  Alert and  oriented x4;  grossly normal neurologically.  Skin:   Dry and intact without significant lesions or rashes. Psychiatric: Demonstrates good judgement and reason without abnormal affect or behaviors.  No recent labs.  Assessment: 1.  Screening for colorectal cancer: Last colonoscopy 10 years ago, no polyps, repeat recommended in 10 years 2.  Chronic anticoagulation: On Eliquis for A. fib   Plan: 1.  Scheduled patient for a screening colonoscopy in the [place] with Dr. Ardis Hughs.  Did discuss risk, benefits, limitations and alternatives and patient agrees to proceed. 2.  Patient was advised to hold her Eliquis for 2 days.  We will  contact Dr.Nisan to ensure that holding her Eliquis is acceptable for her. 3.  Patient to follow in clinic per recommendations from Dr. Ardis Hughs after time of procedure.  Ellouise Newer, PA-C [facility] 12/19/2017, 11:32 AM  Cc: Noralee Space, MD

## 2017-12-22 NOTE — Progress Notes (Signed)
I agree with the above note, plan 

## 2017-12-24 NOTE — Telephone Encounter (Signed)
Spoke with patient today regarding holding Eliquis 24hrs prior to procedure. Patient verbalized understanding that it is okay to hold.

## 2017-12-24 NOTE — Telephone Encounter (Signed)
Yes, that is OK. 

## 2017-12-28 ENCOUNTER — Other Ambulatory Visit: Payer: Self-pay | Admitting: Pulmonary Disease

## 2017-12-28 DIAGNOSIS — E039 Hypothyroidism, unspecified: Secondary | ICD-10-CM

## 2018-01-01 ENCOUNTER — Encounter: Payer: Self-pay | Admitting: Family Medicine

## 2018-01-01 ENCOUNTER — Ambulatory Visit (INDEPENDENT_AMBULATORY_CARE_PROVIDER_SITE_OTHER): Payer: Medicare Other | Admitting: Family Medicine

## 2018-01-01 VITALS — BP 110/68 | HR 70 | Ht 71.0 in | Wt 136.1 lb

## 2018-01-01 DIAGNOSIS — Q796 Ehlers-Danlos syndrome, unspecified: Secondary | ICD-10-CM

## 2018-01-01 DIAGNOSIS — E039 Hypothyroidism, unspecified: Secondary | ICD-10-CM

## 2018-01-01 NOTE — Progress Notes (Signed)
Established Patient Office Visit  Subjective:  Patient ID: Amy Lane, female    DOB: 04-Dec-1950  Age: 67 y.o. MRN: [id number]  CC:  Chief Complaint  Patient presents with  . Establish Care    HPI Amy Lane presents for establishment of care. She has been seeing Dr. Lenna Gilford.  Patient is presents for care by way of transfer.  Her doctor is retiring.  Past medical history of Ehlers-Danlos and has had multiple issues associated with that syndrome.  She is currently seeing cardiology and neurology for chronic migraines.  She recently started Topamax therapy over the summer time.  She has been taking citalopram 20 mg and Ativan 1 mg at nighttime only for sleep.  Prescription indicates that she is allowed to take up up to 3 times a day.  She assures me that she only takes it at night.  Her health issues had forced her to retire from the [employer] in 1999.  She lives with her husband.  She works out at the [employer] times a week.  She does not smoke drink or use illicit drugs. Past Medical History:  Diagnosis Date  . Abnormal gait   . Allergic rhinitis   . Atrial fibrillation ([place])   . Carcinomas, basal cell   . Cardiac pacemaker    DDD  MDT  . Cerebrovascular disease   . Depression   . Dysautonomia ([place])   . Ehler's-Danlos syndrome   . Fibromyalgia   . Headache(784.0)    Migraine  . Hemorrhoids   . Herpes zoster   . History of syncope   . History of tachycardia-bradycardia syndrome   . History of transient ischemic attack   . Hypercholesteremia   . Hypercholesterolemia   . Hypothyroidism   . Long term (current) use of anticoagulants   . Migraine with aura 06/21/2017   Episodes of aphasia and confusion  . Mitral valve prolapse   . PAT (paroxysmal atrial tachycardia) ([place])   . PSVT (paroxysmal supraventricular tachycardia) ([place])   . Skin desquamation    inflammative vaginitis  . SVT (supraventricular tachycardia) ([facility])     Past Surgical History:  Procedure  Laterality Date  . CAROTID ENDARTERECTOMY    . complex mitral valve repair     at the [facility]  . INSERT / REPLACE / REMOVE PACEMAKER    . left carotid endarterectomy  11/2003   by Dr. Delton See  . PACEMAKER INSERTION     medtronic kappa 901  . removed chalazion from left eye lid  09/2011   Dr. Satira Sark    Family History  Problem Relation Age of Onset  . Lung cancer Mother   . Prostate cancer Father   . Alzheimer's disease Father   . Heart disease Father   . Ehlers-Danlos syndrome Father   . Mitral valve prolapse Brother   . Diabetes Sister   . Mitral valve prolapse Sister   . Leukemia Other        Nephew  . Heart disease Unknown        entire maternal side of family  . Cancer Unknown        entire maternal side of family    Social History   Socioeconomic History  . Marital status: Married    Spouse name: Fritz Pickerel  . Number of children: 0  . Years of education: S-College  . Highest education level: Not on file  Occupational History  . Occupation: retired    Fish farm manager: RETIRED  Social  Needs  . Financial resource strain: Not on file  . Food insecurity:    Worry: Not on file    Inability: Not on file  . Transportation needs:    Medical: Not on file    Non-medical: Not on file  Tobacco Use  . Smoking status: Never Smoker  . Smokeless tobacco: Never Used  . Tobacco comment: smoked in her early 20's--smoked 4 cigs per year   Substance and Sexual Activity  . Alcohol use: No    Alcohol/week: 0.0 standard drinks    Comment: 2 drinks per year  . Drug use: No  . Sexual activity: Not on file  Lifestyle  . Physical activity:    Days per week: Not on file    Minutes per session: Not on file  . Stress: Not on file  Relationships  . Social connections:    Talks on phone: Not on file    Gets together: Not on file    Attends religious service: Not on file    Active member of club or organization: Not on file    Attends meetings of clubs or organizations: Not on  file    Relationship status: Not on file  . Intimate partner violence:    Fear of current or ex partner: Not on file    Emotionally abused: Not on file    Physically abused: Not on file    Forced sexual activity: Not on file  Other Topics Concern  . Not on file  Social History Narrative   Patient drinks about 2 cups of caffeine daily.   Patient is right handed.     Outpatient Medications Prior to Visit  Medication Sig Dispense Refill  . acebutolol (SECTRAL) 200 MG capsule TAKE 1 CAPSULE BY MOUTH EVERYDAY AT BEDTIME 90 capsule 2  . Azelastine-Fluticasone 137-50 MCG/ACT SUSP Place 1 spray into the nose as directed. 1 Bottle 3  . citalopram (CELEXA) 20 MG tablet Take 1 tablet (20 mg total) by mouth daily. 90 tablet 3  . donepezil (ARICEPT) 5 MG tablet TAKE 1 TABLET DAILY AT BEDTIME 90 tablet 3  . ELIQUIS 5 MG TABS tablet TAKE 1 TABLET BY MOUTH TWICE A DAY 180 tablet 3  . ezetimibe (ZETIA) 10 MG tablet TAKE 1 TABLET BY MOUTH EVERY DAY 90 tablet 2  . ipratropium (ATROVENT) 0.06 % nasal spray Place 2 sprays into both nostrils 2 (two) times daily. 13 mL 2  . levothyroxine (SYNTHROID, LEVOTHROID) 88 MCG tablet TAKE 1 TABLET (88 MCG TOTAL) BY MOUTH DAILY BEFORE BREAKFAST. 90 tablet 2  . linaclotide (LINZESS) 72 MCG capsule Take 1 capsule (72 mcg total) by mouth daily before breakfast. 30 capsule 11  . LORazepam (ATIVAN) 1 MG tablet TAKE 1 TABLET BY MOUTH 3 TIMES A DAY AS NEEDED FOR ANXIETY 90 tablet 3  . Na Sulfate-K Sulfate-Mg Sulf 17.5-3.13-1.6 GM/177ML SOLN Suprep-Use as directed 354 mL 0  . topiramate (TOPAMAX) 25 MG tablet Take two tablets at bedtime 180 tablet 0  . levothyroxine (SYNTHROID, LEVOTHROID) 88 MCG tablet Take 1 tablet (88 mcg total) by mouth daily before breakfast. 30 tablet 2   No facility-administered medications prior to visit.     Allergies  Allergen Reactions  . Other Other (See Comments)    All QTc prolongation drugs  . Statins Other (See Comments)    Muscle aches   . Adhesive [Tape] Rash  . Latex Rash    ROS Review of Systems  Constitutional: Negative for diaphoresis, fatigue, fever and  unexpected weight change.  HENT: Negative.   Eyes: Negative for photophobia and visual disturbance.  Respiratory: Negative.   Cardiovascular: Negative.   Gastrointestinal: Negative.   Endocrine: Negative for polyphagia and polyuria.  Neurological: Positive for headaches.  Psychiatric/Behavioral: Positive for dysphoric mood and sleep disturbance.      Objective:    Physical Exam  Constitutional: She is oriented to person, place, and time. She appears well-developed and well-nourished. No distress.  HENT:  Head: Normocephalic and atraumatic.  Right Ear: External ear normal.  Left Ear: External ear normal.  Mouth/Throat: Oropharynx is clear and moist. No oropharyngeal exudate.  Eyes: Pupils are equal, round, and reactive to light. Conjunctivae are normal. Right eye exhibits no discharge. Left eye exhibits no discharge. No scleral icterus.  Neck: No JVD present. No tracheal deviation present. No thyromegaly present.  Cardiovascular: Normal rate, regular rhythm and normal heart sounds.  Pulmonary/Chest: Breath sounds normal. No stridor.  Abdominal: Bowel sounds are normal.  Lymphadenopathy:    She has no cervical adenopathy.  Neurological: She is alert and oriented to person, place, and time.  Skin: Skin is warm and dry. She is not diaphoretic.  Psychiatric: She has a normal mood and affect. Her behavior is normal.    BP 110/68   Pulse 70   Ht 5\' 11"  (1.803 m)   Wt 136 lb 2 oz (61.7 kg)   SpO2 98%   BMI 18.99 kg/m  Wt Readings from Last 3 Encounters:  01/01/18 136 lb 2 oz (61.7 kg)  12/19/17 134 lb (60.8 kg)  11/16/17 133 lb 3.2 oz (60.4 kg)   BP Readings from Last 3 Encounters:  01/01/18 110/68  12/19/17 100/68  11/16/17 (!) 96/58   Health Maintenance Due  Topic Date Due  . PAP SMEAR  10/11/2014  . MAMMOGRAM  11/26/2014  . COLONOSCOPY   03/13/2017  . DEXA SCAN  08/16/2017  . PNA vac Low Risk Adult (2 of 2 - PPSV23) 08/16/2017    There are no preventive care reminders to display for this patient.  Lab Results  Component Value Date   TSH 0.177 (L) 06/20/2017   Lab Results  Component Value Date   WBC 8.3 06/20/2017   HGB 11.4 (L) 06/20/2017   HCT 33.2 (L) 06/20/2017   MCV 88.8 06/20/2017   PLT 155 06/20/2017   Lab Results  Component Value Date   NA 131 (L) 06/20/2017   K 4.1 06/20/2017   CO2 21 (L) 06/20/2017   GLUCOSE 112 (H) 06/20/2017   BUN 11 06/20/2017   CREATININE 0.82 06/20/2017   BILITOT 0.6 06/19/2017   ALKPHOS 53 06/19/2017   AST 21 06/19/2017   ALT 10 (L) 06/19/2017   PROT 6.7 06/19/2017   ALBUMIN 3.4 (L) 06/19/2017   CALCIUM 8.5 (L) 06/20/2017   ANIONGAP 9 06/20/2017   GFR 74.64 06/12/2016   Lab Results  Component Value Date   CHOL 166 07/13/2017   Lab Results  Component Value Date   HDL 56.20 07/13/2017   Lab Results  Component Value Date   LDLCALC 93 07/13/2017   Lab Results  Component Value Date   TRIG 80.0 07/13/2017   Lab Results  Component Value Date   CHOLHDL 3 07/13/2017   Lab Results  Component Value Date   HGBA1C 5.5 06/20/2017      Assessment & Plan:   Problem List Items Addressed This Visit      Endocrine   Hypothyroidism - Primary   Relevant Orders  TSH     Musculoskeletal and Integument   EHLERS-DANLOS SYNDROME      No orders of the defined types were placed in this encounter.   Follow-up: No follow-ups on file.   Patient is using Ativan at night only.  We will recheck TSH.  Follow-up is in 3 months.

## 2018-01-02 ENCOUNTER — Encounter: Payer: Medicare Other | Admitting: Nurse Practitioner

## 2018-01-02 LAB — TSH: TSH: 0.99 u[IU]/mL (ref 0.35–4.50)

## 2018-01-04 ENCOUNTER — Encounter: Payer: Self-pay | Admitting: Gastroenterology

## 2018-01-04 ENCOUNTER — Ambulatory Visit (AMBULATORY_SURGERY_CENTER): Payer: Medicare Other | Admitting: Gastroenterology

## 2018-01-04 VITALS — BP 102/53 | HR 61 | Temp 96.8°F | Resp 12 | Ht 71.0 in | Wt 134.0 lb

## 2018-01-04 DIAGNOSIS — Z1211 Encounter for screening for malignant neoplasm of colon: Secondary | ICD-10-CM

## 2018-01-04 MED ORDER — SODIUM CHLORIDE 0.9 % IV SOLN: 500.0000 mL | Freq: Once | INTRAVENOUS | Status: DC

## 2018-01-04 NOTE — Patient Instructions (Addendum)
YOU HAD AN ENDOSCOPIC PROCEDURE TODAY AT [facility]:   Refer to the procedure report that was given to you for any specific questions about what was found during the examination.  If the procedure report does not answer your questions, please call your gastroenterologist to clarify.  If you requested that your care partner not be given the details of your procedure findings, then the procedure report has been included in a sealed envelope for you to review at your convenience later.  YOU SHOULD EXPECT: Some feelings of bloating in the abdomen. Passage of more gas than usual.  Walking can help get rid of the air that was put into your GI tract during the procedure and reduce the bloating. If you had a lower endoscopy (such as a colonoscopy or flexible sigmoidoscopy) you may notice spotting of blood in your stool or on the toilet paper. If you underwent a bowel prep for your procedure, you may not have a normal bowel movement for a few days.  Please Note:  You might notice some irritation and congestion in your nose or some drainage.  This is from the oxygen used during your procedure.  There is no need for concern and it should clear up in a day or so.  SYMPTOMS TO REPORT IMMEDIATELY:   Following lower endoscopy (colonoscopy or flexible sigmoidoscopy):  Excessive amounts of blood in the stool  Significant tenderness or worsening of abdominal pains  Swelling of the abdomen that is new, acute  Fever of 100F or higher   For urgent or emergent issues, a gastroenterologist can be reached at any hour by calling [phone].   DIET:  We do recommend a small meal at first, but then you may proceed to your regular diet.  Drink plenty of fluids but you should avoid alcoholic beverages for 24 hours.  ACTIVITY:  You should plan to take it easy for the rest of today and you should NOT DRIVE or use heavy machinery until tomorrow (because of the sedation medicines used during the test).     FOLLOW UP: Our staff will call the number listed on your records the next business day following your procedure to check on you and address any questions or concerns that you may have regarding the information given to you following your procedure. If we do not reach you, we will leave a message.  However, if you are feeling well and you are not experiencing any problems, there is no need to return our call.  We will assume that you have returned to your regular daily activities without incident.  If any biopsies were taken you will be contacted by phone or by letter within the next 1-3 weeks.  Please call us at [phone] if you have not heard about the biopsies in 3 weeks.    SIGNATURES/CONFIDENTIALITY: You and/or your care partner have signed paperwork which will be entered into your electronic medical record.  These signatures attest to the fact that that the information above on your After Visit Summary has been reviewed and is understood.  Full responsibility of the confidentiality of this discharge information lies with you and/or your care-partner.  Normal colonoscopy.  Recall colonoscopy 10 years-2029.  Resume Eliquis today at prior dose.

## 2018-01-04 NOTE — Op Note (Signed)
[place] Patient Name: Amy Lane Procedure Date: 01/04/2018 3:08 PM MRN: [id number] Endoscopist: Milus Banister , MD Age: 67 Referring MD:  Date of Birth: [birth year] Gender: Female Account #: [id number] Procedure:                Colonoscopy Indications:              Screening for colorectal malignant neoplasm Medicines:                Monitored Anesthesia Care Procedure:                Pre-Anesthesia Assessment:                           - Prior to the procedure, a History and Physical                            was performed, and patient medications and                            allergies were reviewed. The patient's tolerance of                            previous anesthesia was also reviewed. The risks                            and benefits of the procedure and the sedation                            options and risks were discussed with the patient.                            All questions were answered, and informed consent                            was obtained. Prior Anticoagulants: The patient has                            taken Eliquis (apixaban), last dose was 1 day prior                            to procedure. ASA Grade Assessment: II - A patient                            with mild systemic disease. After reviewing the                            risks and benefits, the patient was deemed in                            satisfactory condition to undergo the procedure.                           After obtaining informed consent, the colonoscope  was passed under direct vision. Throughout the                            procedure, the patient's blood pressure, pulse, and                            oxygen saturations were monitored continuously. The                            Colonoscope was introduced through the anus and                            advanced to the the cecum, identified by                            appendiceal  orifice and ileocecal valve. The                            colonoscopy was performed without difficulty. The                            patient tolerated the procedure well. The quality                            of the bowel preparation was good. The ileocecal                            valve, appendiceal orifice, and rectum were                            photographed. Scope In: 3:21:20 PM Scope Out: 3:35:10 PM Scope Withdrawal Time: 0 hours 10 minutes 40 seconds  Total Procedure Duration: 0 hours 13 minutes 50 seconds  Findings:                 The entire examined colon appeared normal on direct                            and retroflexion views. Complications:            No immediate complications. Estimated blood loss:                            None. Estimated Blood Loss:     Estimated blood loss: none. Impression:               - The entire examined colon is normal on direct and                            retroflexion views.                           - No polyps or cancers. Recommendation:           - Patient has a contact number available for  emergencies. The signs and symptoms of potential                            delayed complications were discussed with the                            patient. Return to normal activities tomorrow.                            Written discharge instructions were provided to the                            patient.                           - Resume previous diet.                           - Continue present medications.                           - Repeat colonoscopy in 10 years for screening. Milus Banister, MD 01/04/2018 3:39:00 PM This report has been signed electronically.

## 2018-01-04 NOTE — Progress Notes (Signed)
PT taken to PACU. Monitors in place. VSS. Report given to RN. 

## 2018-01-07 ENCOUNTER — Encounter: Payer: Self-pay | Admitting: Pulmonary Disease

## 2018-01-07 ENCOUNTER — Ambulatory Visit (INDEPENDENT_AMBULATORY_CARE_PROVIDER_SITE_OTHER): Payer: Medicare Other

## 2018-01-07 ENCOUNTER — Telehealth: Payer: Self-pay | Admitting: *Deleted

## 2018-01-07 ENCOUNTER — Ambulatory Visit: Payer: Medicare Other | Admitting: Pulmonary Disease

## 2018-01-07 VITALS — BP 108/62 | HR 63 | Temp 97.3°F | Ht 71.0 in | Wt 136.6 lb

## 2018-01-07 DIAGNOSIS — Z950 Presence of cardiac pacemaker: Secondary | ICD-10-CM

## 2018-01-07 DIAGNOSIS — Z9889 Other specified postprocedural states: Secondary | ICD-10-CM

## 2018-01-07 DIAGNOSIS — Q796 Ehlers-Danlos syndrome, unspecified: Secondary | ICD-10-CM

## 2018-01-07 DIAGNOSIS — E039 Hypothyroidism, unspecified: Secondary | ICD-10-CM

## 2018-01-07 DIAGNOSIS — G901 Familial dysautonomia [Riley-Day]: Secondary | ICD-10-CM

## 2018-01-07 DIAGNOSIS — Z0000 Encounter for general adult medical examination without abnormal findings: Secondary | ICD-10-CM

## 2018-01-07 DIAGNOSIS — Z23 Encounter for immunization: Secondary | ICD-10-CM | POA: Diagnosis not present

## 2018-01-07 DIAGNOSIS — E7800 Pure hypercholesterolemia, unspecified: Secondary | ICD-10-CM

## 2018-01-07 DIAGNOSIS — I059 Rheumatic mitral valve disease, unspecified: Secondary | ICD-10-CM

## 2018-01-07 DIAGNOSIS — I679 Cerebrovascular disease, unspecified: Secondary | ICD-10-CM

## 2018-01-07 DIAGNOSIS — I4819 Other persistent atrial fibrillation: Secondary | ICD-10-CM

## 2018-01-07 DIAGNOSIS — Z86718 Personal history of other venous thrombosis and embolism: Secondary | ICD-10-CM

## 2018-01-07 DIAGNOSIS — Z8679 Personal history of other diseases of the circulatory system: Secondary | ICD-10-CM

## 2018-01-07 NOTE — Telephone Encounter (Signed)
  Follow up Call-  Call back number 01/04/2018  Post procedure Call Back phone  # [phone]  Permission to leave phone message Yes  Some recent data might be hidden     Patient questions:  Do you have a fever, pain , or abdominal swelling? No. Pain Score  0 *  Have you tolerated food without any problems? Yes.    Have you been able to return to your normal activities? Yes.    Do you have any questions about your discharge instructions: Diet   No. Medications  No. Follow up visit  No.  Do you have questions or concerns about your Care? No.  Actions: * If pain score is 4 or above: No action needed, pain <4.

## 2018-01-07 NOTE — Patient Instructions (Signed)
Today we updated your med list in our EPIC system...    Continue your current medications the same...  Today we gave you the last of the currently indicated pneumonia vaccines- PNEUMOVAX 23...  You are up-to-date on everything now...  Amy Lane, it has been my honor to have ben one of your doctors over these many years.  Your family has meant the world to me!  Wishing you-all a very merry Christmas & a happy & healthy new year!!!

## 2018-01-07 NOTE — Telephone Encounter (Signed)
First attempt, left VM.  

## 2018-01-08 ENCOUNTER — Telehealth: Payer: Self-pay | Admitting: Pulmonary Disease

## 2018-01-08 NOTE — Telephone Encounter (Signed)
Called and spoke with Patient.  She has some swelling and soreness in her left upper arm, from pneumonia vaccine given 01/07/18.  Explained that soreness and redness  was common.  Patient denied any SHOB, rash, or distress.  Instructed her to take Ibuprofen and place ice on sore muscle, injection spot.  Understanding stated.  Nothing further at this time.

## 2018-01-22 NOTE — Progress Notes (Signed)
Patient ID: Amy Lane, female   DOB: [birth year], 67 y.o.   MRN: [id number]     67 y.o. female history of bradycardia post pacer  Sees Dr Caryl Comes  status post generator replacement and lead repair March 2011.   She also has a history of Ehlers-Danlos syndrome in the context of a marfanoid habitus, mitral valve prolapse status post mitral valve repair at the [facility] clinic with prior TIA on chronic anticoagulation  as well as syncope due to diagnosis of long QT syndrome. 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 Of MS with LE weakness Unable to do MRI due to pacer Seen by Dr Jannifer Franklin Left visual field cut but  Carotids normal.   May in hospital with stroke/migraine like symptoms Confused and lost speech for 10 hours CT negative Rx migraine meds and to f/u with neurology Jannifer Franklin   Husband battling prostate cancer Increasingly less willing to travel with her   Seeing EP next week Needs to discuss battery life and MRI comparability upgrade given her neuro issues Not clear to me that her leads would be changed out    ROS: Denies fever, malais, weight loss, blurry vision, decreased visual acuity, cough, sputum, SOB, hemoptysis, pleuritic pain, palpitaitons, heartburn, abdominal pain, melena, lower extremity edema, claudication, or rash.  All other systems reviewed and negative  General: BP 110/68   Pulse 75   Ht 5\' 11"  (1.803 m)   Wt 136 lb 12.8 oz (62.1 kg)   SpO2 98%   BMI 19.08 kg/m  Affect appropriate Thin frail female  HEENT: normal Neck supple with no adenopathy JVP normal no bruits no thyromegaly Lungs clear with no wheezing and good diaphragmatic  motion Heart:  S1/S2 no murmur, no rub, gallop or click PMI normal  Pacer under left clavicle  Abdomen: benighn, BS positve, no tenderness, no AAA no bruit.  No HSM or HJR Distal pulses intact with no bruits No edema Neuro non-focal Skin warm and dry No muscular weakness     Current Outpatient Medications  Medication Sig Dispense Refill  . acebutolol (SECTRAL) 200 MG capsule TAKE 1 CAPSULE BY MOUTH EVERYDAY AT BEDTIME 90 capsule 2  . Azelastine-Fluticasone 137-50 MCG/ACT SUSP Place 1 spray into the nose as directed. 1 Bottle 3  . citalopram (CELEXA) 20 MG tablet Take 1 tablet (20 mg total) by mouth daily. 90 tablet 3  . donepezil (ARICEPT) 5 MG tablet Take 5 mg by mouth at bedtime.    Marland Kitchen ELIQUIS 5 MG TABS tablet TAKE 1 TABLET BY MOUTH TWICE A DAY 180 tablet 3  . ezetimibe (ZETIA) 10 MG tablet TAKE 1 TABLET BY MOUTH EVERY DAY 90 tablet 2  . levothyroxine (SYNTHROID, LEVOTHROID) 88 MCG tablet TAKE 1 TABLET (88 MCG TOTAL) BY MOUTH DAILY BEFORE BREAKFAST. 90 tablet 2  . LORazepam (ATIVAN) 1 MG tablet TAKE 1 TABLET BY MOUTH 3 TIMES A DAY AS NEEDED FOR ANXIETY 90 tablet 3  . topiramate (TOPAMAX) 25 MG tablet Take two tablets at bedtime 180 tablet 0   No current facility-administered medications for this visit.     Allergies  Other; Statins; Adhesive [tape]; and Latex  Electrocardiogram:  Atrial pacing rate 77 somewhat long delay from P wave and atrial spike  ICRBBB normal ventricular complex 03/19/15  A pacing rate 73  RBBB 12/11/16  A paced RBBB   Assessment and Plan  Pacer:  Reviewed Dr Aquilla Hacker last note normal function normal remote transmission  ? When time Can it be changed out and be MRI compatible   POTS:  Improved with no postural symptoms    TIA:  CEA patent by duplex  Now on Eliquis with no bleeding issues ? Related to PAF  MVrepair:  No murmur on exam echo 04/11/16 repair looks great with normal EF   Thyroid:  Synthroid dose needs to be decreased f/u with primary  Lab  Results  Component Value Date   TSH 0.99 01/01/2018   Chol:   Lab Results  Component Value Date   LDLCALC 93 07/13/2017   Neuro:  Continue aricept for cognitive dysfunction F//U DR Jannifer Franklin Migraine with TIA like symptoms now on Topamax   Ortho:  Patellar degeneration post cortisone injection f/u Giofre would not encourage surgery unless Pain gets worse   [name]

## 2018-01-28 ENCOUNTER — Ambulatory Visit (INDEPENDENT_AMBULATORY_CARE_PROVIDER_SITE_OTHER): Payer: Medicare Other | Admitting: Cardiovascular Disease

## 2018-01-28 ENCOUNTER — Encounter: Payer: Self-pay | Admitting: Cardiovascular Disease

## 2018-01-28 VITALS — BP 110/68 | HR 75 | Ht 71.0 in | Wt 136.8 lb

## 2018-01-28 DIAGNOSIS — R000 Tachycardia, unspecified: Secondary | ICD-10-CM

## 2018-01-28 DIAGNOSIS — G459 Transient cerebral ischemic attack, unspecified: Secondary | ICD-10-CM | POA: Diagnosis not present

## 2018-01-28 DIAGNOSIS — Z950 Presence of cardiac pacemaker: Secondary | ICD-10-CM | POA: Diagnosis not present

## 2018-01-28 DIAGNOSIS — I059 Rheumatic mitral valve disease, unspecified: Secondary | ICD-10-CM | POA: Diagnosis not present

## 2018-01-28 DIAGNOSIS — G90A Postural orthostatic tachycardia syndrome (POTS): Secondary | ICD-10-CM

## 2018-01-28 DIAGNOSIS — I951 Orthostatic hypotension: Secondary | ICD-10-CM

## 2018-01-28 NOTE — Patient Instructions (Addendum)

## 2018-02-04 ENCOUNTER — Ambulatory Visit: Payer: Medicare Other

## 2018-02-05 NOTE — Progress Notes (Signed)
Remote pacemaker transmission.   

## 2018-02-05 NOTE — Progress Notes (Signed)
Electrophysiology Office Note Date: 02/06/2018  ID:  Amy Lane, DOB [birth year], MRN [id number]  PCP: Libby Maw, MD Primary Cardiologist: Johnsie Cancel Electrophysiologist: Caryl Comes  CC: Pacemaker follow-up  Amy Lane is a 68 y.o. female seen today for Dr Caryl Comes.  She presents today for routine electrophysiology followup.  Since last being seen in our clinic, the patient reports doing very well.  She denies chest pain, palpitations, dyspnea, PND, orthopnea, nausea, vomiting, dizziness, syncope, edema, weight gain, or early satiety.  Device History: STJ dual chamber PPM implanted 1998 for sinus bradycardia; gen change and new RA lead 2003 (old RA lead explanted); gen change 2011   Past Medical History:  Diagnosis Date  . Abnormal gait   . Allergic rhinitis   . Anxiety   . Atrial fibrillation ([place])   . Carcinomas, basal cell   . Cardiac pacemaker    DDD  MDT  . Cerebrovascular disease   . Clotting disorder (HCC)    DVT  . Depression   . Dysautonomia ([place])   . Ehler's-Danlos syndrome   . Fibromyalgia   . Headache(784.0)    Migraine  . Heart murmur   . Hemorrhoids   . Herpes zoster   . History of syncope   . History of tachycardia-bradycardia syndrome   . History of transient ischemic attack   . Hypercholesteremia   . Hypercholesterolemia   . Hypothyroidism   . Long term (current) use of anticoagulants   . Migraine with aura 06/21/2017   Episodes of aphasia and confusion  . Mitral valve prolapse   . PAT (paroxysmal atrial tachycardia) ([place])   . PSVT (paroxysmal supraventricular tachycardia) ([place])   . Skin desquamation    inflammative vaginitis  . Stroke ([place])   . SVT (supraventricular tachycardia) (HCC)    Past Surgical History:  Procedure Laterality Date  . ABLATION     x 2  . CAROTID ENDARTERECTOMY    . complex mitral valve repair     at the [facility]  . INSERT / REPLACE / REMOVE PACEMAKER    . LEAD REVISION    . left carotid  endarterectomy  11/2003   by Dr. Delton See  . PACEMAKER INSERTION     medtronic kappa 901  . removed chalazion from left eye lid  09/2011   Dr. Satira Sark    Current Outpatient Medications  Medication Sig Dispense Refill  . acebutolol (SECTRAL) 200 MG capsule TAKE 1 CAPSULE BY MOUTH EVERYDAY AT BEDTIME 90 capsule 2  . Azelastine-Fluticasone 137-50 MCG/ACT SUSP Place 1 spray into the nose as directed. 1 Bottle 3  . citalopram (CELEXA) 20 MG tablet Take 1 tablet (20 mg total) by mouth daily. 90 tablet 3  . donepezil (ARICEPT) 5 MG tablet Take 5 mg by mouth at bedtime.    Marland Kitchen ELIQUIS 5 MG TABS tablet TAKE 1 TABLET BY MOUTH TWICE A DAY 180 tablet 3  . ezetimibe (ZETIA) 10 MG tablet TAKE 1 TABLET BY MOUTH EVERY DAY 90 tablet 2  . levothyroxine (SYNTHROID, LEVOTHROID) 88 MCG tablet TAKE 1 TABLET (88 MCG TOTAL) BY MOUTH DAILY BEFORE BREAKFAST. 90 tablet 2  . LORazepam (ATIVAN) 1 MG tablet TAKE 1 TABLET BY MOUTH 3 TIMES A DAY AS NEEDED FOR ANXIETY 90 tablet 3  . topiramate (TOPAMAX) 25 MG tablet Take two tablets at bedtime 180 tablet 0   No current facility-administered medications for this visit.     Allergies:   Other; Statins; Adhesive [tape]; and Latex  Social History: Social History   Socioeconomic History  . Marital status: Married    Spouse name: Amy Lane  . Number of children: 0  . Years of education: S-College  . Highest education level: Not on file  Occupational History  . Occupation: retired    Fish farm manager: RETIRED  Social Needs  . Financial resource strain: Not on file  . Food insecurity:    Worry: Not on file    Inability: Not on file  . Transportation needs:    Medical: Not on file    Non-medical: Not on file  Tobacco Use  . Smoking status: Never Smoker  . Smokeless tobacco: Never Used  . Tobacco comment: smoked in her early 20's--smoked 4 cigs per year   Substance and Sexual Activity  . Alcohol use: No    Alcohol/week: 0.0 standard drinks    Comment: 2 drinks per year    . Drug use: No  . Sexual activity: Not on file  Lifestyle  . Physical activity:    Days per week: Not on file    Minutes per session: Not on file  . Stress: Not on file  Relationships  . Social connections:    Talks on phone: Not on file    Gets together: Not on file    Attends religious service: Not on file    Active member of club or organization: Not on file    Attends meetings of clubs or organizations: Not on file    Relationship status: Not on file  . Intimate partner violence:    Fear of current or ex partner: Not on file    Emotionally abused: Not on file    Physically abused: Not on file    Forced sexual activity: Not on file  Other Topics Concern  . Not on file  Social History Narrative   Patient drinks about 2 cups of caffeine daily.   Patient is right handed.     Family History: Family History  Problem Relation Age of Onset  . Lung cancer Mother   . Prostate cancer Father   . Alzheimer's disease Father   . Heart disease Father   . Ehlers-Danlos syndrome Father   . Mitral valve prolapse Brother   . Diabetes Sister   . Mitral valve prolapse Sister   . Leukemia Other        Nephew  . Heart disease Other        entire maternal side of family  . Cancer Other        entire maternal side of family     Review of Systems: All other systems reviewed and are otherwise negative except as noted above.   Physical Exam: VS:  BP 120/60   Pulse 86   Ht 5\' 11"  (1.803 m)   Wt 135 lb (61.2 kg)   SpO2 99%   BMI 18.83 kg/m  , BMI Body mass index is 18.83 kg/m.  GEN- The patient is elderly appearing, alert and oriented x 3 today.   HEENT: normocephalic, atraumatic; sclera clear, conjunctiva pink; hearing intact; oropharynx clear; neck supple  Lungs- Clear to ausculation bilaterally, normal work of breathing.  No wheezes, rales, rhonchi Heart- Regular rate and rhythm   GI- soft, non-tender, non-distended, bowel sounds present  Extremities- no clubbing,  cyanosis, or edema  MS- no significant deformity or atrophy Skin- warm and dry, no rash or lesion; PPM pocket well healed Psych- euthymic mood, full affect Neuro- strength and sensation are intact  PPM Interrogation-  reviewed in detail today,  See PACEART report  EKG:  EKG is not ordered today.  Recent Labs: 06/19/2017: ALT 10 06/20/2017: BUN 11; Creatinine, Ser 0.82; Hemoglobin 11.4; Platelets 155; Potassium 4.1; Sodium 131 01/01/2018: TSH 0.99   Wt Readings from Last 3 Encounters:  02/06/18 135 lb (61.2 kg)  01/28/18 136 lb 12.8 oz (62.1 kg)  01/07/18 136 lb 9.6 oz (62 kg)     Other studies Reviewed: Additional studies/ records that were reviewed today include: Dr Caryl Comes and Dr Kyla Balzarine office notes   Assessment and Plan:  1.  Symptomatic bradycardia Normal PPM function See Pace Art report No changes today  2.  Paroxysmal atrial fibrillation Burden by device interrogation 0% Continue Eliquis for CHADS2VASC of 4  3.  POTS With ongoing symptoms but stable   Current medicines are reviewed at length with the patient today.   The patient does not have concerns regarding her medicines.  The following changes were made today:  none  Labs/ tests ordered today include: none Orders Placed This Encounter  Procedures  . CUP PACEART INCLINIC DEVICE CHECK     Disposition:   Follow up with [facility], Dr Johnsie Cancel as scheduled, Dr Caryl Comes 1 year      Signed, Chanetta Marshall, NP 02/06/2018 11:42 AM  [place] [address] [phone] (office) [phone] (fax)

## 2018-02-06 ENCOUNTER — Telehealth: Payer: Self-pay

## 2018-02-06 ENCOUNTER — Encounter: Payer: Self-pay | Admitting: Nurse Practitioner

## 2018-02-06 ENCOUNTER — Encounter

## 2018-02-06 ENCOUNTER — Ambulatory Visit (INDEPENDENT_AMBULATORY_CARE_PROVIDER_SITE_OTHER): Payer: Medicare Other | Admitting: Nurse Practitioner

## 2018-02-06 VITALS — BP 120/60 | HR 86 | Ht 71.0 in | Wt 135.0 lb

## 2018-02-06 DIAGNOSIS — I495 Sick sinus syndrome: Secondary | ICD-10-CM

## 2018-02-06 DIAGNOSIS — G90A Postural orthostatic tachycardia syndrome (POTS): Secondary | ICD-10-CM

## 2018-02-06 DIAGNOSIS — N905 Atrophy of vulva: Secondary | ICD-10-CM | POA: Insufficient documentation

## 2018-02-06 DIAGNOSIS — I951 Orthostatic hypotension: Secondary | ICD-10-CM | POA: Diagnosis not present

## 2018-02-06 DIAGNOSIS — R000 Tachycardia, unspecified: Secondary | ICD-10-CM | POA: Diagnosis not present

## 2018-02-06 DIAGNOSIS — N952 Postmenopausal atrophic vaginitis: Secondary | ICD-10-CM | POA: Insufficient documentation

## 2018-02-06 DIAGNOSIS — I480 Paroxysmal atrial fibrillation: Secondary | ICD-10-CM

## 2018-02-06 DIAGNOSIS — N898 Other specified noninflammatory disorders of vagina: Secondary | ICD-10-CM | POA: Insufficient documentation

## 2018-02-06 LAB — CUP PACEART INCLINIC DEVICE CHECK
Date Time Interrogation Session: 20200115110159
Implantable Lead Implant Date: 20031031
Implantable Lead Location: 753860
MDC IDC LEAD IMPLANT DT: 19981106
MDC IDC LEAD LOCATION: 753859
MDC IDC PG IMPLANT DT: 20110310

## 2018-02-06 NOTE — Telephone Encounter (Signed)
Left message for patient to remind of missed remote transmission.  

## 2018-02-06 NOTE — Patient Instructions (Signed)
Medication Instructions:  none If you need a refill on your cardiac medications before your next appointment, please call your pharmacy.   Lab work: none If you have labs (blood work) drawn today and your tests are completely normal, you will receive your results only by: Marland Kitchen MyChart Message (if you have MyChart) OR . A paper copy in the mail If you have any lab test that is abnormal or we need to change your treatment, we will call you to review the results.  Testing/Procedures: none  Follow-Up: At [facility], you and your health needs are our priority.  As part of our continuing mission to provide you with exceptional heart care, we have created designated Provider Care Teams.  These Care Teams include your primary Cardiologist (physician) and Advanced Practice Providers (APPs -  Physician Assistants and Nurse Practitioners) who all work together to provide you with the care you need, when you need it. You will need a follow up appointment in 1 years.  Please call our office 2 months in advance to schedule this appointment.  You may see Dr Caryl Comes or one of the following Advanced Practice Providers on your designated Care Team:   Chanetta Marshall, NP . Tommye Standard, PA-C  Any Other Special Instructions Will Be Listed Below (If Applicable). Remote monitoring is used to monitor your Pacemaker  from home. This monitoring reduces the number of office visits required to check your device to one time per year. It allows [place] to keep an eye on the functioning of your device to ensure it is working properly. You are scheduled for a device check from home on 05/08/2018. You may send your transmission at any time that day. If you have a wireless device, the transmission will be sent automatically. After your physician reviews your transmission, you will receive a postcard with your next transmission date.

## 2018-02-08 ENCOUNTER — Encounter: Payer: Self-pay | Admitting: Cardiology

## 2018-02-16 ENCOUNTER — Other Ambulatory Visit: Payer: Self-pay | Admitting: Cardiovascular Disease

## 2018-03-15 ENCOUNTER — Other Ambulatory Visit: Payer: Self-pay | Admitting: Neurology

## 2018-04-03 ENCOUNTER — Ambulatory Visit: Payer: Medicare Other | Admitting: Family Medicine

## 2018-04-05 ENCOUNTER — Other Ambulatory Visit: Payer: Self-pay

## 2018-04-05 ENCOUNTER — Ambulatory Visit (INDEPENDENT_AMBULATORY_CARE_PROVIDER_SITE_OTHER): Payer: Medicare Other | Admitting: Family Medicine

## 2018-04-05 ENCOUNTER — Encounter: Payer: Self-pay | Admitting: Family Medicine

## 2018-04-05 VITALS — BP 110/70 | HR 90 | Ht 71.0 in | Wt 138.4 lb

## 2018-04-05 DIAGNOSIS — E039 Hypothyroidism, unspecified: Secondary | ICD-10-CM | POA: Diagnosis not present

## 2018-04-05 NOTE — Patient Instructions (Signed)
Viral Gastroenteritis, Adult    Viral gastroenteritis is also known as the stomach flu. This condition is caused by various viruses. These viruses can be passed from person to person very easily (are very contagious). This condition may affect your stomach, small intestine, and large intestine. It can cause sudden watery diarrhea, fever, and vomiting.  Diarrhea and vomiting can make you feel weak and cause you to become dehydrated. You may not be able to keep fluids down. Dehydration can make you tired and thirsty, cause you to have a dry mouth, and decrease how often you urinate. Older adults and people with other diseases or a weak immune system are at higher risk for dehydration.  It is important to replace the fluids that you lose from diarrhea and vomiting. If you become severely dehydrated, you may need to get fluids through an IV tube.  What are the causes?  Gastroenteritis is caused by various viruses, including rotavirus and norovirus. Norovirus is the most common cause in adults.  You can get sick by eating food, drinking water, or touching a surface contaminated with one of these viruses. You can also get sick from sharing utensils or other personal items with an infected person.  What increases the risk?  This condition is more likely to develop in people:  · Who have a weak defense system (immune system).  · Who live with one or more children who are younger than 2 years old.  · Who live in a nursing home.  · Who go on cruise ships.  What are the signs or symptoms?  Symptoms of this condition start suddenly 1-2 days after exposure to a virus. Symptoms may last a few days or as long as a week. The most common symptoms are watery diarrhea and vomiting. Other symptoms include:  · Fever.  · Headache.  · Fatigue.  · Pain in the abdomen.  · Chills.  · Weakness.  · Nausea.  · Muscle aches.  · Loss of appetite.  How is this diagnosed?  This condition is diagnosed with a medical history and physical exam. You  may also have a stool test to check for viruses or other infections.  How is this treated?  This condition typically goes away on its own. The focus of treatment is to restore lost fluids (rehydration). Your health care provider may recommend that you take an oral rehydration solution (ORS) to replace important salts and minerals (electrolytes) in your body. Severe cases of this condition may require giving fluids through an IV tube.  Treatment may also include medicine to help with your symptoms.  Follow these instructions at home:    Follow instructions from your health care provider about how to care for yourself at home.  Follow these recommendations as told by your health care provider:  · Take an ORS. This is a drink that is sold at pharmacies and retail stores.  · Drink clear fluids in small amounts as you are able. Clear fluids include water, ice chips, diluted fruit juice, and low-calorie sports drinks.  · Eat bland, easy-to-digest foods in small amounts as you are able. These foods include bananas, applesauce, rice, lean meats, toast, and crackers.  · Avoid fluids that contain a lot of sugar or caffeine, such as energy drinks, sports drinks, and soda.  · Avoid alcohol.  · Avoid spicy or fatty foods.  General instructions  · Drink enough fluid to keep your urine clear or pale yellow.  · Wash your hands often.   If soap and water are not available, use hand sanitizer.  · Make sure that all people in your household wash their hands well and often.  · Take over-the-counter and prescription medicines only as told by your health care provider.  · Rest at home while you recover.  · Watch your condition for any changes.  · Take a warm bath to relieve any burning or pain from frequent diarrhea episodes.  · Keep all follow-up visits as told by your health care provider. This is important.  Contact a health care provider if:  · You cannot keep fluids down.  · Your symptoms get worse.  · You have new symptoms.  · You  feel light-headed or dizzy.  · You have muscle cramps.  Get help right away if:  · You have chest pain.  · You feel extremely weak or you faint.  · You see blood in your vomit.  · Your vomit looks like coffee grounds.  · You have bloody or black stools or stools that look like tar.  · You have a severe headache, a stiff neck, or both.  · You have a rash.  · You have severe pain, cramping, or bloating in your abdomen.  · You have trouble breathing or you are breathing very quickly.  · Your heart is beating very quickly.  · Your skin feels cold and clammy.  · You feel confused.  · You have pain when you urinate.  · You have signs of dehydration, such as:  ? Dark urine, very little urine, or no urine.  ? Cracked lips.  ? Dry mouth.  ? Sunken eyes.  ? Sleepiness.  ? Weakness.  This information is not intended to replace advice given to you by your health care provider. Make sure you discuss any questions you have with your health care provider.  Document Released: 01/09/2005 Document Revised: 08/24/2016 Document Reviewed: 09/15/2014  Elsevier Interactive Patient Education © 2019 [employer]

## 2018-04-05 NOTE — Progress Notes (Signed)
Established Patient Office Visit  Subjective:  Patient ID: Amy Lane, female    DOB: December 29, 1950  Age: 68 y.o. MRN: [id number]  CC:  Chief Complaint  Patient presents with  . Follow-up    HPI Amy Lane presents for follow-up of a 4-day history of nasal congestion postnasal drip nausea without vomiting and watery diarrhea without blood or pus.  Mild elevation in temperature.  Some myalgias.  Denies arthralgias or rash.  He is now feeling much better.  She had experienced some pressure in her lower anterior chest with some nausea but denies that there was an exertional component or shortness of breath with it.  Symptoms have all resolved.  Past Medical History:  Diagnosis Date  . Abnormal gait   . Allergic rhinitis   . Anxiety   . Atrial fibrillation ([place])   . Carcinomas, basal cell   . Cardiac pacemaker    DDD  MDT  . Cerebrovascular disease   . Clotting disorder (H[facility])    DVT  . Depression   . Dysautonomia ([place])   . Ehler's-Danlos syndrome   . Fibromyalgia   . Headache(784.0)    Migraine  . Heart murmur   . Hemorrhoids   . Herpes zoster   . History of syncope   . History of tachycardia-bradycardia syndrome   . History of transient ischemic attack   . Hypercholesteremia   . Hypercholesterolemia   . Hypothyroidism   . Long term (current) use of anticoagulants   . Migraine with aura 06/21/2017   Episodes of aphasia and confusion  . Mitral valve prolapse   . PAT (paroxysmal atrial tachycardia) ([place])   . PSVT (paroxysmal supraventricular tachycardia) ([place])   . Skin desquamation    inflammative vaginitis  . Stroke ([place])   . SVT (supraventricular tachycardia) (HCC)     Past Surgical History:  Procedure Laterality Date  . ABLATION     x 2  . CAROTID ENDARTERECTOMY    . complex mitral valve repair     at the [facility]  . INSERT / REPLACE / REMOVE PACEMAKER    . LEAD REVISION    . left carotid endarterectomy  11/2003   by Dr. Delton See  .  PACEMAKER INSERTION     medtronic kappa 901  . removed chalazion from left eye lid  09/2011   Dr. Satira Sark    Family History  Problem Relation Age of Onset  . Lung cancer Mother   . Prostate cancer Father   . Alzheimer's disease Father   . Heart disease Father   . Ehlers-Danlos syndrome Father   . Mitral valve prolapse Brother   . Diabetes Sister   . Mitral valve prolapse Sister   . Leukemia Other        Nephew  . Heart disease Other        entire maternal side of family  . Cancer Other        entire maternal side of family    Social History   Socioeconomic History  . Marital status: Married    Spouse name: Fritz Pickerel  . Number of children: 0  . Years of education: S-College  . Highest education level: Not on file  Occupational History  . Occupation: retired    Fish farm manager: RETIRED  Social Needs  . Financial resource strain: Not on file  . Food insecurity:    Worry: Not on file    Inability: Not on file  . Transportation needs:  Medical: Not on file    Non-medical: Not on file  Tobacco Use  . Smoking status: Never Smoker  . Smokeless tobacco: Never Used  . Tobacco comment: smoked in her early 20's--smoked 4 cigs per year   Substance and Sexual Activity  . Alcohol use: No    Alcohol/week: 0.0 standard drinks    Comment: 2 drinks per year  . Drug use: No  . Sexual activity: Not on file  Lifestyle  . Physical activity:    Days per week: Not on file    Minutes per session: Not on file  . Stress: Not on file  Relationships  . Social connections:    Talks on phone: Not on file    Gets together: Not on file    Attends religious service: Not on file    Active member of club or organization: Not on file    Attends meetings of clubs or organizations: Not on file    Relationship status: Not on file  . Intimate partner violence:    Fear of current or ex partner: Not on file    Emotionally abused: Not on file    Physically abused: Not on file    Forced sexual activity:  Not on file  Other Topics Concern  . Not on file  Social History Narrative   Patient drinks about 2 cups of caffeine daily.   Patient is right handed.     Outpatient Medications Prior to Visit  Medication Sig Dispense Refill  . acebutolol (SECTRAL) 200 MG capsule TAKE 1 CAPSULE BY MOUTH EVERYDAY AT BEDTIME 90 capsule 3  . Azelastine-Fluticasone 137-50 MCG/ACT SUSP Place 1 spray into the nose as directed. 1 Bottle 3  . citalopram (CELEXA) 20 MG tablet Take 1 tablet (20 mg total) by mouth daily. 90 tablet 3  . donepezil (ARICEPT) 5 MG tablet Take 5 mg by mouth at bedtime.    Marland Kitchen ELIQUIS 5 MG TABS tablet TAKE 1 TABLET BY MOUTH TWICE A DAY 180 tablet 3  . ezetimibe (ZETIA) 10 MG tablet TAKE 1 TABLET BY MOUTH EVERY DAY 90 tablet 2  . levothyroxine (SYNTHROID, LEVOTHROID) 88 MCG tablet TAKE 1 TABLET (88 MCG TOTAL) BY MOUTH DAILY BEFORE BREAKFAST. 90 tablet 2  . LORazepam (ATIVAN) 1 MG tablet TAKE 1 TABLET BY MOUTH 3 TIMES A DAY AS NEEDED FOR ANXIETY 90 tablet 3  . topiramate (TOPAMAX) 25 MG tablet TAKE 2 TABLETS BY MOUTH AT BEDTIME 180 tablet 0   No facility-administered medications prior to visit.     Allergies  Allergen Reactions  . Other Other (See Comments)    All QTc prolongation drugs  . Statins Other (See Comments)    Muscle aches  . Adhesive [Tape] Rash  . Latex Rash    ROS Review of Systems  Constitutional: Negative for chills, diaphoresis, fatigue, fever and unexpected weight change.  HENT: Positive for congestion and postnasal drip.   Eyes: Negative for photophobia and visual disturbance.  Respiratory: Negative.   Cardiovascular: Negative.   Gastrointestinal: Negative for anal bleeding, blood in stool, constipation, diarrhea, nausea and vomiting.  Genitourinary: Negative.   Musculoskeletal: Negative for arthralgias and myalgias.  Allergic/Immunologic: Negative for immunocompromised state.  Neurological: Negative.   Psychiatric/Behavioral: Negative.       Objective:     Physical Exam  Constitutional: She is oriented to person, place, and time. She appears well-developed and well-nourished. No distress.  HENT:  Head: Normocephalic and atraumatic.  Right Ear: External ear normal.  Left  Ear: External ear normal.  Mouth/Throat: Oropharynx is clear and moist. No oropharyngeal exudate.  Eyes: Pupils are equal, round, and reactive to light. Conjunctivae are normal. Right eye exhibits no discharge. Left eye exhibits no discharge. No scleral icterus.  Neck: Neck supple. No JVD present. No tracheal deviation present. No thyromegaly present.  Cardiovascular: Normal rate, regular rhythm and normal heart sounds.  Pulmonary/Chest: Effort normal and breath sounds normal. No stridor.  Abdominal: Bowel sounds are normal. She exhibits no distension. There is no abdominal tenderness. There is no rebound and no guarding.  Lymphadenopathy:    She has no cervical adenopathy.  Neurological: She is alert and oriented to person, place, and time.  Skin: Skin is warm and dry. She is not diaphoretic.  Psychiatric: She has a normal mood and affect. Her behavior is normal.    BP 110/70   Pulse 90   Ht 5\' 11"  (1.803 m)   Wt 138 lb 6 oz (62.8 kg)   SpO2 97%   BMI 19.30 kg/m  Wt Readings from Last 3 Encounters:  04/05/18 138 lb 6 oz (62.8 kg)  02/06/18 135 lb (61.2 kg)  01/28/18 136 lb 12.8 oz (62.1 kg)   BP Readings from Last 3 Encounters:  04/05/18 110/70  02/06/18 120/60  01/28/18 110/68   Guideline developer:  UpToDate (see UpToDate for funding source) Date Released: June 2014  Health Maintenance Due  Topic Date Due  . DEXA SCAN  08/16/2017    There are no preventive care reminders to display for this patient.  Lab Results  Component Value Date   TSH 0.99 01/01/2018   Lab Results  Component Value Date   WBC 8.3 06/20/2017   HGB 11.4 (L) 06/20/2017   HCT 33.2 (L) 06/20/2017   MCV 88.8 06/20/2017   PLT 155 06/20/2017   Lab Results  Component Value  Date   NA 131 (L) 06/20/2017   K 4.1 06/20/2017   CO2 21 (L) 06/20/2017   GLUCOSE 112 (H) 06/20/2017   BUN 11 06/20/2017   CREATININE 0.82 06/20/2017   BILITOT 0.6 06/19/2017   ALKPHOS 53 06/19/2017   AST 21 06/19/2017   ALT 10 (L) 06/19/2017   PROT 6.7 06/19/2017   ALBUMIN 3.4 (L) 06/19/2017   CALCIUM 8.5 (L) 06/20/2017   ANIONGAP 9 06/20/2017   GFR 74.64 06/12/2016   Lab Results  Component Value Date   CHOL 166 07/13/2017   Lab Results  Component Value Date   HDL 56.20 07/13/2017   Lab Results  Component Value Date   LDLCALC 93 07/13/2017   Lab Results  Component Value Date   TRIG 80.0 07/13/2017   Lab Results  Component Value Date   CHOLHDL 3 07/13/2017   Lab Results  Component Value Date   HGBA1C 5.5 06/20/2017      Assessment & Plan:   Problem List Items Addressed This Visit      Endocrine   Hypothyroidism - Primary      No orders of the defined types were placed in this encounter.   Follow-up: Return in about 6 months (around 10/06/2018), or if symptoms worsen or fail to improve.

## 2018-04-27 ENCOUNTER — Other Ambulatory Visit: Payer: Self-pay | Admitting: Pulmonary Disease

## 2018-04-30 ENCOUNTER — Other Ambulatory Visit: Payer: Self-pay | Admitting: Family Medicine

## 2018-05-08 ENCOUNTER — Ambulatory Visit (INDEPENDENT_AMBULATORY_CARE_PROVIDER_SITE_OTHER): Payer: Medicare Other | Admitting: *Deleted

## 2018-05-08 ENCOUNTER — Other Ambulatory Visit: Payer: Self-pay

## 2018-05-08 DIAGNOSIS — I495 Sick sinus syndrome: Secondary | ICD-10-CM | POA: Diagnosis not present

## 2018-05-08 DIAGNOSIS — G459 Transient cerebral ischemic attack, unspecified: Secondary | ICD-10-CM

## 2018-05-09 LAB — CUP PACEART REMOTE DEVICE CHECK
Battery Impedance: 879 Ohm
Battery Remaining Longevity: 67 mo
Battery Voltage: 2.78 V
Brady Statistic AP VP Percent: 0 %
Brady Statistic AP VS Percent: 82 %
Brady Statistic AS VP Percent: 0 %
Brady Statistic AS VS Percent: 18 %
Date Time Interrogation Session: 20200415223358
Implantable Lead Implant Date: 19981106
Implantable Lead Implant Date: 20031031
Implantable Lead Location: 753859
Implantable Lead Location: 753860
Implantable Lead Model: 5076
Implantable Pulse Generator Implant Date: 20110310
Lead Channel Impedance Value: 408 Ohm
Lead Channel Impedance Value: 639 Ohm
Lead Channel Pacing Threshold Amplitude: 0.625 V
Lead Channel Pacing Threshold Amplitude: 0.875 V
Lead Channel Pacing Threshold Pulse Width: 0.4 ms
Lead Channel Pacing Threshold Pulse Width: 0.4 ms
Lead Channel Setting Pacing Amplitude: 2 V
Lead Channel Setting Pacing Amplitude: 2.5 V
Lead Channel Setting Pacing Pulse Width: 0.4 ms
Lead Channel Setting Sensing Sensitivity: 2.8 mV

## 2018-05-14 NOTE — Progress Notes (Signed)
Remote pacemaker transmission.   

## 2018-06-14 ENCOUNTER — Other Ambulatory Visit: Payer: Self-pay | Admitting: Neurology

## 2018-06-17 ENCOUNTER — Other Ambulatory Visit: Payer: Self-pay | Admitting: Pulmonary Disease

## 2018-06-18 ENCOUNTER — Other Ambulatory Visit: Payer: Self-pay | Admitting: Cardiovascular Disease

## 2018-07-26 ENCOUNTER — Other Ambulatory Visit: Payer: Self-pay | Admitting: Neurology

## 2018-08-07 ENCOUNTER — Ambulatory Visit (INDEPENDENT_AMBULATORY_CARE_PROVIDER_SITE_OTHER): Payer: Medicare Other | Admitting: *Deleted

## 2018-08-07 DIAGNOSIS — I495 Sick sinus syndrome: Secondary | ICD-10-CM | POA: Diagnosis not present

## 2018-08-08 ENCOUNTER — Telehealth: Payer: Self-pay

## 2018-08-08 LAB — CUP PACEART REMOTE DEVICE CHECK
Battery Impedance: 1037 Ohm
Battery Remaining Longevity: 60 mo
Battery Voltage: 2.78 V
Brady Statistic AP VP Percent: 0 %
Brady Statistic AP VS Percent: 82 %
Brady Statistic AS VP Percent: 0 %
Brady Statistic AS VS Percent: 18 %
Date Time Interrogation Session: 20200716104703
Implantable Lead Implant Date: 19981106
Implantable Lead Implant Date: 20031031
Implantable Lead Location: 753859
Implantable Lead Location: 753860
Implantable Lead Model: 5076
Implantable Pulse Generator Implant Date: 20110310
Lead Channel Impedance Value: 397 Ohm
Lead Channel Impedance Value: 604 Ohm
Lead Channel Pacing Threshold Amplitude: 0.75 V
Lead Channel Pacing Threshold Amplitude: 1 V
Lead Channel Pacing Threshold Pulse Width: 0.4 ms
Lead Channel Pacing Threshold Pulse Width: 0.4 ms
Lead Channel Sensing Intrinsic Amplitude: 8 mV
Lead Channel Setting Pacing Amplitude: 2 V
Lead Channel Setting Pacing Amplitude: 2.5 V
Lead Channel Setting Pacing Pulse Width: 0.4 ms
Lead Channel Setting Sensing Sensitivity: 2.8 mV

## 2018-08-08 NOTE — Telephone Encounter (Signed)
Spoke with patient to remind of missed remote transmission 

## 2018-08-15 DIAGNOSIS — D229 Melanocytic nevi, unspecified: Secondary | ICD-10-CM | POA: Diagnosis not present

## 2018-08-15 DIAGNOSIS — L821 Other seborrheic keratosis: Secondary | ICD-10-CM | POA: Diagnosis not present

## 2018-08-15 DIAGNOSIS — L814 Other melanin hyperpigmentation: Secondary | ICD-10-CM | POA: Diagnosis not present

## 2018-08-15 DIAGNOSIS — K130 Diseases of lips: Secondary | ICD-10-CM | POA: Diagnosis not present

## 2018-08-15 DIAGNOSIS — L819 Disorder of pigmentation, unspecified: Secondary | ICD-10-CM | POA: Diagnosis not present

## 2018-08-16 ENCOUNTER — Encounter: Payer: Self-pay | Admitting: Cardiology

## 2018-08-16 NOTE — Progress Notes (Signed)
Remote pacemaker transmission.   

## 2018-08-22 ENCOUNTER — Other Ambulatory Visit: Payer: Self-pay | Admitting: Neurology

## 2018-08-25 ENCOUNTER — Encounter: Payer: Self-pay | Admitting: Family Medicine

## 2018-08-25 ENCOUNTER — Other Ambulatory Visit: Payer: Self-pay

## 2018-08-26 MED ORDER — TOPIRAMATE 25 MG PO TABS: 50.0000 mg | ORAL_TABLET | Freq: Every day | ORAL | 0 refills | Status: DC

## 2018-09-25 DIAGNOSIS — L710 Perioral dermatitis: Secondary | ICD-10-CM | POA: Diagnosis not present

## 2018-09-27 ENCOUNTER — Encounter: Payer: Self-pay | Admitting: Family Medicine

## 2018-10-04 ENCOUNTER — Telehealth: Payer: Self-pay

## 2018-10-04 NOTE — Telephone Encounter (Signed)

## 2018-10-07 ENCOUNTER — Encounter: Payer: Self-pay | Admitting: Family Medicine

## 2018-10-07 ENCOUNTER — Ambulatory Visit (INDEPENDENT_AMBULATORY_CARE_PROVIDER_SITE_OTHER): Payer: Medicare Other | Admitting: Family Medicine

## 2018-10-07 VITALS — BP 102/70 | HR 91 | Ht 71.0 in | Wt 138.0 lb

## 2018-10-07 DIAGNOSIS — D649 Anemia, unspecified: Secondary | ICD-10-CM | POA: Diagnosis not present

## 2018-10-07 DIAGNOSIS — E7800 Pure hypercholesterolemia, unspecified: Secondary | ICD-10-CM | POA: Diagnosis not present

## 2018-10-07 DIAGNOSIS — Z23 Encounter for immunization: Secondary | ICD-10-CM | POA: Insufficient documentation

## 2018-10-07 DIAGNOSIS — M85859 Other specified disorders of bone density and structure, unspecified thigh: Secondary | ICD-10-CM | POA: Insufficient documentation

## 2018-10-07 DIAGNOSIS — E039 Hypothyroidism, unspecified: Secondary | ICD-10-CM | POA: Diagnosis not present

## 2018-10-07 DIAGNOSIS — E2839 Other primary ovarian failure: Secondary | ICD-10-CM | POA: Insufficient documentation

## 2018-10-07 LAB — LDL CHOLESTEROL, DIRECT: Direct LDL: 101 mg/dL

## 2018-10-07 LAB — CBC
HCT: 40 % (ref 36.0–46.0)
Hemoglobin: 13.5 g/dL (ref 12.0–15.0)
MCHC: 33.7 g/dL (ref 30.0–36.0)
MCV: 92.4 fl (ref 78.0–100.0)
Platelets: 163 10*3/uL (ref 150.0–400.0)
RBC: 4.33 Mil/uL (ref 3.87–5.11)
RDW: 12.9 % (ref 11.5–15.5)
WBC: 4.6 10*3/uL (ref 4.0–10.5)

## 2018-10-07 LAB — TSH: TSH: 0.37 u[IU]/mL (ref 0.35–4.50)

## 2018-10-07 LAB — BASIC METABOLIC PANEL
BUN: 14 mg/dL (ref 6–23)
CO2: 25 mEq/L (ref 19–32)
Calcium: 9.6 mg/dL (ref 8.4–10.5)
Chloride: 102 mEq/L (ref 96–112)
Creatinine, Ser: 0.82 mg/dL (ref 0.40–1.20)
GFR: 69.72 mL/min (ref >60.00)
Glucose, Bld: 58 mg/dL — ABNORMAL LOW (ref 70–99)
Potassium: 4.2 mEq/L (ref 3.5–5.1)
Sodium: 135 mEq/L (ref 135–145)

## 2018-10-07 NOTE — Progress Notes (Signed)
Established Patient Office Visit  Subjective:  Patient ID: Amy Lane, female    DOB: 20-Aug-1950  Age: 68 y.o. MRN: [id number]  CC:  Chief Complaint  Patient presents with  . Follow-up    HPI Amy Lane presents for   follow-up of her hypothyroidism and elevated LDL cholesterol.  Continues to take her Synthroid on a fasting stomach daily.  We discussed the fact that if anything she is slightly over replaced on relatively low-dose Synthroid.  Patient denies tachycardia or chest pain.  Continues to take Zetia for her elevated cholesterol.  She has not tolerated multiple statins.  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  She is due for a bone scan and agrees to go.  She is not taking supplemental calcium or vitamin D at this time.  She is due for eye checks and dental care.  She is self-conscious about some perioral oral dermatitis that her dermatologist is currently treating  Past Medical History:  Diagnosis Date  . Abnormal gait   . Allergic rhinitis   . Anxiety   . Atrial fibrillation ([place])   . Carcinomas, basal cell   . Cardiac pacemaker    DDD  MDT  . Cerebrovascular disease   . Clotting disorder ([facility])    DVT  . Depression   . Dysautonomia ([place])   . Ehler's-Danlos syndrome   . Fibromyalgia   . Headache(784.0)    Migraine  . Heart murmur   . Hemorrhoids   . Herpes zoster   . History of syncope   . History of tachycardia-bradycardia syndrome   . History of transient ischemic attack   . Hypercholesteremia   . Hypercholesterolemia   . Hypothyroidism   . Long term (current) use of anticoagulants   . Migraine with aura 06/21/2017   Episodes of aphasia and confusion  . Mitral valve prolapse   . PAT (paroxysmal atrial tachycardia) ([place])   . PSVT (paroxysmal supraventricular tachycardia) ([place])   . Skin desquamation    inflammative vaginitis  . Stroke ([place])   . SVT  (supraventricular tachycardia) (HCC)     Past Surgical History:  Procedure Laterality Date  . ABLATION     x 2  . CAROTID ENDARTERECTOMY    . complex mitral valve repair     at the [facility]  . INSERT / REPLACE / REMOVE PACEMAKER    . LEAD REVISION    . left carotid endarterectomy  11/2003   by Dr. Delton See  . PACEMAKER INSERTION     medtronic kappa 901  . removed chalazion from left eye lid  09/2011   Dr. Satira Sark    Family History  Problem Relation Age of Onset  . Lung cancer Mother   . Prostate cancer Father   . Alzheimer's disease Father   . Heart disease Father   . Ehlers-Danlos syndrome Father   . Mitral valve prolapse Brother   . Diabetes Sister   . Mitral valve prolapse Sister   . Leukemia Other        Nephew  . Heart disease Other        entire maternal side of family  . Cancer Other        entire maternal side of family    Social History   Socioeconomic History  . Marital status: Married    Spouse name: Fritz Pickerel  . Number of children: 0  . Years  of education: S-College  . Highest education level: Not on file  Occupational History  . Occupation: retired    Fish farm manager: RETIRED  Social Needs  . Financial resource strain: Not on file  . Food insecurity    Worry: Not on file    Inability: Not on file  . Transportation needs    Medical: Not on file    Non-medical: Not on file  Tobacco Use  . Smoking status: Never Smoker  . Smokeless tobacco: Never Used  . Tobacco comment: smoked in her early 20's--smoked 4 cigs per year   Substance and Sexual Activity  . Alcohol use: No    Alcohol/week: 0.0 standard drinks    Comment: 2 drinks per year  . Drug use: No  . Sexual activity: Not on file  Lifestyle  . Physical activity    Days per week: Not on file    Minutes per session: Not on file  . Stress: Not on file  Relationships  . Social Herbalist on phone: Not on file    Gets together: Not on file    Attends religious service: Not on file     Active member of club or organization: Not on file    Attends meetings of clubs or organizations: Not on file    Relationship status: Not on file  . Intimate partner violence    Fear of current or ex partner: Not on file    Emotionally abused: Not on file    Physically abused: Not on file    Forced sexual activity: Not on file  Other Topics Concern  . Not on file  Social History Narrative   Patient drinks about 2 cups of caffeine daily.   Patient is right handed.     Outpatient Medications Prior to Visit  Medication Sig Dispense Refill  . acebutolol (SECTRAL) 200 MG capsule TAKE 1 CAPSULE BY MOUTH EVERYDAY AT BEDTIME 90 capsule 3  . Azelastine-Fluticasone 137-50 MCG/ACT SUSP Place 1 spray into the nose as directed. 1 Bottle 3  . citalopram (CELEXA) 20 MG tablet TAKE 1 TABLET BY MOUTH EVERY DAY 90 tablet 2  . donepezil (ARICEPT) 5 MG tablet TAKE 1 TABLET BY MOUTH EVERYDAY AT BEDTIME 90 tablet 0  . ELIQUIS 5 MG TABS tablet TAKE 1 TABLET BY MOUTH TWICE A DAY 180 tablet 3  . ezetimibe (ZETIA) 10 MG tablet TAKE 1 TABLET BY MOUTH EVERY DAY 90 tablet 1  . levothyroxine (SYNTHROID, LEVOTHROID) 88 MCG tablet TAKE 1 TABLET (88 MCG TOTAL) BY MOUTH DAILY BEFORE BREAKFAST. 90 tablet 2  . LORazepam (ATIVAN) 1 MG tablet TAKE 1 TABLET BY MOUTH 3 TIMES A DAY AS NEEDED FOR ANXIETY 90 tablet 3  . topiramate (TOPAMAX) 25 MG tablet Take 2 tablets (50 mg total) by mouth at bedtime. 180 tablet 0   No facility-administered medications prior to visit.     Allergies  Allergen Reactions  . Other Other (See Comments)    All QTc prolongation drugs  . Statins Other (See Comments)    Muscle aches  . Adhesive [Tape] Rash  . Latex Rash    ROS Review of Systems  Constitutional: Negative.   Eyes: Negative for photophobia and visual disturbance.  Respiratory: Negative.   Cardiovascular: Negative.   Gastrointestinal: Negative.   Endocrine: Negative for polyphagia and polyuria.  Genitourinary: Negative  for difficulty urinating, frequency and urgency.  Musculoskeletal: Negative for arthralgias and gait problem.  Skin: Positive for rash.  Allergic/Immunologic: Negative for  immunocompromised state.  Neurological: Negative for seizures, light-headedness and numbness.  Hematological: Does not bruise/bleed easily.  Psychiatric/Behavioral: Negative.       Objective:    Physical Exam  Constitutional: She is oriented to person, place, and time. She appears well-developed and well-nourished. No distress.  HENT:  Head: Normocephalic and atraumatic.  Right Ear: External ear normal.  Left Ear: External ear normal.  Mouth/Throat: Oropharynx is clear and moist. No oropharyngeal exudate.  Eyes: Pupils are equal, round, and reactive to light. Conjunctivae are normal. Right eye exhibits no discharge. Left eye exhibits no discharge. No scleral icterus.  Neck: Neck supple. No JVD present. No tracheal deviation present. No thyromegaly present.  Cardiovascular: Normal rate, regular rhythm and normal heart sounds.  Pulmonary/Chest: Effort normal and breath sounds normal. No stridor.  Abdominal: Bowel sounds are normal.  Musculoskeletal:        General: No edema.  Lymphadenopathy:    She has no cervical adenopathy.  Neurological: She is alert and oriented to person, place, and time.  Skin: Skin is warm and dry. Rash (scattered pustules in the peri oral area. ) noted. She is not diaphoretic.  Psychiatric: She has a normal mood and affect. Her behavior is normal.    BP 102/70   Pulse 91   Ht 5\' 11"  (1.803 m)   Wt 138 lb (62.6 kg)   SpO2 97%   BMI 19.25 kg/m  Wt Readings from Last 3 Encounters:  10/07/18 138 lb (62.6 kg)  04/05/18 138 lb 6 oz (62.8 kg)  02/06/18 135 lb (61.2 kg)   BP Readings from Last 3 Encounters:  10/07/18 102/70  04/05/18 110/70  02/06/18 120/60   Guideline developer:  UpToDate (see UpToDate for funding source) Date Released: June 2014  Health Maintenance Due  Topic  Date Due  . DEXA SCAN  08/16/2017  . INFLUENZA VACCINE  08/24/2018    There are no preventive care reminders to display for this patient.  Lab Results  Component Value Date   TSH 0.99 01/01/2018   Lab Results  Component Value Date   WBC 8.3 06/20/2017   HGB 11.4 (L) 06/20/2017   HCT 33.2 (L) 06/20/2017   MCV 88.8 06/20/2017   PLT 155 06/20/2017   Lab Results  Component Value Date   NA 131 (L) 06/20/2017   K 4.1 06/20/2017   CO2 21 (L) 06/20/2017   GLUCOSE 112 (H) 06/20/2017   BUN 11 06/20/2017   CREATININE 0.82 06/20/2017   BILITOT 0.6 06/19/2017   ALKPHOS 53 06/19/2017   AST 21 06/19/2017   ALT 10 (L) 06/19/2017   PROT 6.7 06/19/2017   ALBUMIN 3.4 (L) 06/19/2017   CALCIUM 8.5 (L) 06/20/2017   ANIONGAP 9 06/20/2017   GFR 74.64 06/12/2016   Lab Results  Component Value Date   CHOL 166 07/13/2017   Lab Results  Component Value Date   HDL 56.20 07/13/2017   Lab Results  Component Value Date   LDLCALC 93 07/13/2017   Lab Results  Component Value Date   TRIG 80.0 07/13/2017   Lab Results  Component Value Date   CHOLHDL 3 07/13/2017   Lab Results  Component Value Date   HGBA1C 5.5 06/20/2017      Assessment & Plan:   Problem List Items Addressed This Visit      Endocrine   Hypothyroidism   Relevant Orders   TSH     Musculoskeletal and Integument   Osteopenia of neck of femur  Other   Need for influenza vaccination - Primary   Relevant Orders   Flu Vaccine QUAD High Dose(Fluad) (Completed)   Estrogen deficiency   Relevant Orders   DG Bone Density   Elevated LDL cholesterol level   Relevant Orders   LDL cholesterol, direct   Anemia   Relevant Orders   Basic metabolic panel   CBC   Iron, TIBC and Ferritin Panel      No orders of the defined types were placed in this encounter.   Follow-up: Return in about 6 months (around 04/06/2019), or if symptoms worsen or fail to improve.   Patient was given information on osteopenia and  we discussed supplementing with vitamin D and calcium.  Hemoglobin has been depressed to have checking for iron deficiency with her lowish MCV.

## 2018-10-07 NOTE — Patient Instructions (Signed)
Osteopenia  Osteopenia is a loss of thickness (density) inside of the bones. Another name for osteopenia is low bone mass. Mild osteopenia is a normal part of aging. It is not a disease, and it does not cause symptoms. However, if you have osteopenia and continue to lose bone mass, you could develop a condition that causes the bones to become thin and break more easily (osteoporosis). You may also lose some height, have back pain, and have a stooped posture. Although osteopenia is not a disease, making changes to your lifestyle and diet can help to prevent osteopenia from developing into osteoporosis. What are the causes? Osteopenia is caused by loss of calcium in the bones.  Bones are constantly changing. Old bone cells are continually being replaced with new bone cells. This process builds new bone. The mineral calcium is needed to build new bone and maintain bone density. Bone density is usually highest around age 35. After that, most people's bodies cannot replace all the bone they have lost with new bone. What increases the risk? You are more likely to develop this condition if:  You are older than age 50.  You are a woman who went through menopause early.  You have a long illness that keeps you in bed.  You do not get enough exercise.  You lack certain nutrients (malnutrition).  You have an overactive thyroid gland (hyperthyroidism).  You smoke.  You drink a lot of alcohol.  You are taking medicines that weaken the bones, such as steroids. What are the signs or symptoms? This condition does not cause any symptoms. You may have a slightly higher risk for bone breaks (fractures), so getting fractures more easily than normal may be an indication of osteopenia. How is this diagnosed? Your health care provider can diagnose this condition with a special type of X-ray exam that measures bone density (dual-energy X-ray absorptiometry, DEXA). This test can measure bone density in your  hips, spine, and wrists. Osteopenia has no symptoms, so this condition is usually diagnosed after a routine bone density screening test is done for osteoporosis. This routine screening is usually done for:  Women who are age 65 or older.  Men who are age 70 or older. If you have risk factors for osteopenia, you may have the screening test at an earlier age. How is this treated? Making dietary and lifestyle changes can lower your risk for osteoporosis. If you have severe osteopenia that is close to becoming osteoporosis, your health care provider may prescribe medicines and dietary supplements such as calcium and vitamin D. These supplements help to rebuild bone density. Follow these instructions at home:   Take over-the-counter and prescription medicines only as told by your health care provider. These include vitamins and supplements.  Eat a diet that is high in calcium and vitamin D. ? Calcium is found in dairy products, beans, salmon, and leafy green vegetables like spinach and broccoli. ? Look for foods that have vitamin D and calcium added to them (fortified foods), such as orange juice, cereal, and bread.  Do 30 or more minutes of a weight-bearing exercise every day, such as walking, jogging, or playing a sport. These types of exercises strengthen the bones.  Take precautions at home to lower your risk of falling, such as: ? Keeping rooms well-lit and free of clutter, such as cords. ? Installing safety rails on stairs. ? Using rubber mats in the bathroom or other areas that are often wet or slippery.  Do not use   any products that contain nicotine or tobacco, such as cigarettes and e-cigarettes. If you need help quitting, ask your health care provider.  Avoid alcohol or limit alcohol intake to no more than 1 drink a day for nonpregnant women and 2 drinks a day for men. One drink equals 12 oz of beer, 5 oz of wine, or 1 oz of hard liquor.  Keep all follow-up visits as told by your  health care provider. This is important. Contact a health care provider if:  You have not had a bone density screening for osteoporosis and you are: ? A woman, age 65 or older. ? A man, age 70 or older.  You are a postmenopausal woman who has not had a bone density screening for osteoporosis.  You are older than age 50 and you want to know if you should have bone density screening for osteoporosis. Summary  Osteopenia is a loss of thickness (density) inside of the bones. Another name for osteopenia is low bone mass.  Osteopenia is not a disease, but it may increase your risk for a condition that causes the bones to become thin and break more easily (osteoporosis).  You may be at risk for osteopenia if you are older than age 50 or if you are a woman who went through early menopause.  Osteopenia does not cause any symptoms, but it can be diagnosed with a bone density screening test.  Dietary and lifestyle changes are the first treatment for osteopenia. These may lower your risk for osteoporosis. This information is not intended to replace advice given to you by your health care provider. Make sure you discuss any questions you have with your health care provider. Document Released: 10/18/2016 Document Revised: 12/22/2016 Document Reviewed: 10/18/2016 Elsevier Patient Education  2020 [employer]  

## 2018-10-08 LAB — IRON,TIBC AND FERRITIN PANEL
%SAT: 28 % (calc) (ref 16–45)
Ferritin: 25 ng/mL (ref 16–288)
Iron: 103 ug/dL (ref 45–160)
TIBC: 363 mcg/dL (calc) (ref 250–450)

## 2018-10-14 ENCOUNTER — Other Ambulatory Visit: Payer: Self-pay | Admitting: Family Medicine

## 2018-10-14 ENCOUNTER — Other Ambulatory Visit: Payer: Self-pay | Admitting: Neurology

## 2018-10-14 ENCOUNTER — Other Ambulatory Visit: Payer: Self-pay | Admitting: Cardiovascular Disease

## 2018-10-14 DIAGNOSIS — E039 Hypothyroidism, unspecified: Secondary | ICD-10-CM

## 2018-10-14 NOTE — Telephone Encounter (Signed)
55f 62.6kg Scr 0.82 10/06/17 Lovw/seiler 02/06/18

## 2018-10-28 DIAGNOSIS — L710 Perioral dermatitis: Secondary | ICD-10-CM | POA: Diagnosis not present

## 2018-11-07 ENCOUNTER — Ambulatory Visit (INDEPENDENT_AMBULATORY_CARE_PROVIDER_SITE_OTHER): Payer: Medicare Other | Admitting: *Deleted

## 2018-11-07 DIAGNOSIS — I471 Supraventricular tachycardia: Secondary | ICD-10-CM | POA: Diagnosis not present

## 2018-11-07 DIAGNOSIS — I495 Sick sinus syndrome: Secondary | ICD-10-CM

## 2018-11-07 LAB — CUP PACEART REMOTE DEVICE CHECK
Battery Impedance: 1116 Ohm
Battery Remaining Longevity: 58 mo
Battery Voltage: 2.78 V
Brady Statistic AP VP Percent: 0 %
Brady Statistic AP VS Percent: 82 %
Brady Statistic AS VP Percent: 0 %
Brady Statistic AS VS Percent: 18 %
Date Time Interrogation Session: 20201015124641
Implantable Lead Implant Date: 19981106
Implantable Lead Implant Date: 20031031
Implantable Lead Location: 753859
Implantable Lead Location: 753860
Implantable Lead Model: 5076
Implantable Pulse Generator Implant Date: 20110310
Lead Channel Impedance Value: 387 Ohm
Lead Channel Impedance Value: 614 Ohm
Lead Channel Pacing Threshold Amplitude: 0.75 V
Lead Channel Pacing Threshold Amplitude: 1 V
Lead Channel Pacing Threshold Pulse Width: 0.4 ms
Lead Channel Pacing Threshold Pulse Width: 0.4 ms
Lead Channel Sensing Intrinsic Amplitude: 5.6 mV
Lead Channel Setting Pacing Amplitude: 2 V
Lead Channel Setting Pacing Amplitude: 2.5 V
Lead Channel Setting Pacing Pulse Width: 0.4 ms
Lead Channel Setting Sensing Sensitivity: 2.8 mV

## 2018-11-14 ENCOUNTER — Ambulatory Visit (INDEPENDENT_AMBULATORY_CARE_PROVIDER_SITE_OTHER): Payer: Medicare Other | Admitting: Neurology

## 2018-11-14 ENCOUNTER — Encounter: Payer: Self-pay | Admitting: Neurology

## 2018-11-14 ENCOUNTER — Other Ambulatory Visit: Payer: Self-pay

## 2018-11-14 VITALS — BP 112/68 | HR 68 | Temp 97.8°F | Ht 71.0 in | Wt 141.0 lb

## 2018-11-14 DIAGNOSIS — R269 Unspecified abnormalities of gait and mobility: Secondary | ICD-10-CM

## 2018-11-14 DIAGNOSIS — G43001 Migraine without aura, not intractable, with status migrainosus: Secondary | ICD-10-CM | POA: Diagnosis not present

## 2018-11-14 DIAGNOSIS — R413 Other amnesia: Secondary | ICD-10-CM | POA: Diagnosis not present

## 2018-11-14 MED ORDER — DONEPEZIL HCL 5 MG PO TABS: ORAL_TABLET | ORAL | 3 refills | Status: DC

## 2018-11-14 MED ORDER — TOPIRAMATE 25 MG PO TABS: 50.0000 mg | ORAL_TABLET | Freq: Every day | ORAL | 3 refills | Status: DC

## 2018-11-14 NOTE — Progress Notes (Signed)
Reason for visit: Migraine headache  Amy Lane is an 68 y.o. female  History of present illness:  Amy Lane is a 68 year old right-handed white female with history of Ehlers-Danlos syndrome, POTS, mild cognitive impairment, and migraine headache.  The patient has done well over the last year, she has not had any headaches taking 50 mg of Topamax at night.  The patient has had some chronic issues with mild gait instability but she feels that this may be worsening some, she occasionally have some numbness in the toes.  The patient has not had any alteration in her memory over time.  She is now involved with a small business, managing the money.  She overall indicates that she is doing well.  Past Medical History:  Diagnosis Date   Abnormal gait    Allergic rhinitis    Anxiety    Atrial fibrillation (HCC)    Carcinomas, basal cell    Cardiac pacemaker    DDD  MDT   Cerebrovascular disease    Clotting disorder ([place])    DVT   Depression    Dysautonomia ([place])    Ehler's-Danlos syndrome    Fibromyalgia    Headache(784.0)    Migraine   Heart murmur    Hemorrhoids    Herpes zoster    History of syncope    History of tachycardia-bradycardia syndrome    History of transient ischemic attack    Hypercholesteremia    Hypercholesterolemia    Hypothyroidism    Long term (current) use of anticoagulants    Migraine with aura 06/21/2017   Episodes of aphasia and confusion   Mitral valve prolapse    PAT (paroxysmal atrial tachycardia) (HCC)    PSVT (paroxysmal supraventricular tachycardia) (HCC)    Skin desquamation    inflammative vaginitis   Stroke (HCC)    SVT (supraventricular tachycardia) (HCC)     Past Surgical History:  Procedure Laterality Date   ABLATION     x 2   CAROTID ENDARTERECTOMY     complex mitral valve repair     at the [facility] / REPLACE / [place]     left carotid  endarterectomy  11/2003   by Dr. Delton See   PACEMAKER INSERTION     medtronic kappa 901   removed chalazion from left eye lid  09/2011   Dr. Satira Sark    Family History  Problem Relation Age of Onset   Lung cancer Mother    Prostate cancer Father    Alzheimer's disease Father    Heart disease Father    Ehlers-Danlos syndrome Father    Mitral valve prolapse Brother    Diabetes Sister    Mitral valve prolapse Sister    Leukemia Other        Nephew   Heart disease Other        entire maternal side of family   Cancer Other        entire maternal side of family    Social history:  reports that she has never smoked. She has never used smokeless tobacco. She reports that she does not drink alcohol or use drugs.    Allergies  Allergen Reactions   Other Other (See Comments)    All QTc prolongation drugs   Statins Other (See Comments)    Muscle aches   Adhesive [Tape] Rash   Latex Rash    Medications:  Prior to Admission medications  Medication Sig Start Date End Date Taking? Authorizing Provider  acebutolol (SECTRAL) 200 MG capsule TAKE 1 CAPSULE BY MOUTH EVERYDAY AT BEDTIME 02/18/18  Yes Josue Hector, MD  Azelastine-Fluticasone 137-50 MCG/ACT SUSP Place 1 spray into the nose as directed. 11/16/17  Yes Lauraine Rinne, NP  citalopram (CELEXA) 20 MG tablet TAKE 1 TABLET BY MOUTH EVERY DAY 05/01/18  Yes Libby Maw, MD  donepezil (ARICEPT) 5 MG tablet TAKE 1 TABLET BY MOUTH EVERYDAY AT BEDTIME 10/14/18  Yes Kathrynn Ducking, MD  ELIQUIS 5 MG TABS tablet TAKE 1 TABLET BY MOUTH TWICE A DAY 10/14/18  Yes Josue Hector, MD  ezetimibe (ZETIA) 10 MG tablet TAKE 1 TABLET BY MOUTH EVERY DAY 06/18/18  Yes Josue Hector, MD  levothyroxine (SYNTHROID) 88 MCG tablet TAKE 1 TABLET (88 MCG TOTAL) BY MOUTH DAILY BEFORE BREAKFAST. 10/14/18  Yes Libby Maw, MD  LORazepam (ATIVAN) 1 MG tablet TAKE 1 TABLET BY MOUTH 3 TIMES A DAY AS NEEDED FOR ANXIETY 12/31/17   Yes Noralee Space, MD  topiramate (TOPAMAX) 25 MG tablet Take 2 tablets (50 mg total) by mouth at bedtime. 08/26/18  Yes Kathrynn Ducking, MD    ROS:  Out of a complete 14 system review of symptoms, the patient complains only of the following symptoms, and all other reviewed systems are negative.  Gait instability  Blood pressure 112/68, pulse 68, temperature 97.8 F (36.6 C), temperature source Temporal, height 5\' 11"  (1.803 m), weight 141 lb (64 kg), SpO2 97 %.  Physical Exam  General: The patient is alert and cooperative at the time of the examination.  Skin: No significant peripheral edema is noted.   Neurologic Exam  Mental status: The patient is alert and oriented x 3 at the time of the examination. The patient has apparent normal recent and remote memory, with an apparently normal attention span and concentration ability.   Cranial nerves: Facial symmetry is present. Speech is normal, no aphasia or dysarthria is noted. Extraocular movements are full. Visual fields are full.  Motor: The patient has good strength in all 4 extremities.  Sensory examination: Soft touch sensation is symmetric on the face, arms, and legs.  Coordination: The patient has good finger-nose-finger and heel-to-shin bilaterally.  Gait and station: The patient has a normal gait. Tandem gait is slightly unsteady. Romberg is negative, but is unsteady. No drift is seen.  Reflexes: Deep tendon reflexes are symmetric.   Assessment/Plan:  1.  Mild cognitive impairment  2.  Migraine headaches, well controlled  3.  Mild gait instability  The patient will be sent for physical therapy for gait training.  A prescription for with her Aricept and for the Topamax was sent in.  She will follow-up here in 1 year, sooner if needed.  [name] MD 11/14/2018 10:49 AM  [facility] [address] [place], [place] [address]  Phone [phone] Fax [phone]

## 2018-11-20 ENCOUNTER — Other Ambulatory Visit: Payer: Self-pay | Admitting: Family Medicine

## 2018-11-21 NOTE — Progress Notes (Signed)
Remote pacemaker transmission.   

## 2018-12-03 NOTE — Progress Notes (Signed)
Virtual Visit via Video Note  I connected with patient on 12/04/18 at  2:30 PM EST by audio enabled telemedicine application and verified that I am speaking with the correct person using two identifiers.   THIS ENCOUNTER IS A VIRTUAL VISIT DUE TO COVID-19 - PATIENT WAS NOT SEEN IN THE OFFICE. PATIENT HAS CONSENTED TO VIRTUAL VISIT / TELEMEDICINE VISIT   Location of patient: home  Location of provider: office  I discussed the limitations of evaluation and management by telemedicine and the availability of in person appointments. The patient expressed understanding and agreed to proceed.   Subjective:   Amy Lane is a 68 y.o. female who presents for an Initial Medicare Annual Wellness Visit.  The Patient was informed that the wellness visit is to identify future health risk and educate and initiate measures that can reduce risk for increased disease through the lifespan.   Describes health as fair, good or great? Really good.   Works 3 days per week. She owns a Production assistant, radio.  Review of Systems   Cardiac Risk Factors include: advanced age (>95men, >53 women) Home Safety/Smoke Alarms: Feels safe in home. Smoke alarms in place.  Lives w/ husband in 4th floor apt. Has elevator.    Female:    Mammo- pt states she will schedule       Dexa scan- scheduled 01/02/19        CCS- 01/04/18. Recall 10 yrs.      Objective:      Advanced Directives 12/04/2018 04/27/2016 12/11/2014 04/06/2014 01/26/2014 12/09/2013 01/29/2013  Does Patient Have a Medical Advance Directive? Yes No Yes Yes No Yes Patient has advance directive, copy not in chart  Type of Advance Directive [place];Living will - Healthcare Power of [place] -  Does patient want to make changes to medical advance directive? No - Patient declined - - - - - -  Copy of [place] in Chart? No - copy requested - - - - - -  Would patient  like information on creating a medical advance directive? - No - Patient declined - - No - patient declined information - -    Current Medications (verified) Outpatient Encounter Medications as of 12/04/2018  Medication Sig  . acebutolol (SECTRAL) 200 MG capsule TAKE 1 CAPSULE BY MOUTH EVERYDAY AT BEDTIME  . Azelastine-Fluticasone 137-50 MCG/ACT SUSP Place 1 spray into the nose as directed.  . citalopram (CELEXA) 20 MG tablet TAKE 1 TABLET BY MOUTH EVERY DAY  . donepezil (ARICEPT) 5 MG tablet TAKE 1 TABLET BY MOUTH EVERYDAY AT BEDTIME  . ELIQUIS 5 MG TABS tablet TAKE 1 TABLET BY MOUTH TWICE A DAY  . ezetimibe (ZETIA) 10 MG tablet TAKE 1 TABLET BY MOUTH EVERY DAY  . levothyroxine (SYNTHROID) 88 MCG tablet TAKE 1 TABLET (88 MCG TOTAL) BY MOUTH DAILY BEFORE BREAKFAST.  Marland Kitchen LORazepam (ATIVAN) 1 MG tablet TAKE 1 TABLET 3 TIMES A DAY AS NEEDED FOR ANXIETY  . topiramate (TOPAMAX) 25 MG tablet Take 2 tablets (50 mg total) by mouth at bedtime.   No facility-administered encounter medications on file as of 12/04/2018.     Allergies (verified) Other, Statins, Adhesive [tape], and Latex   History: Past Medical History:  Diagnosis Date  . Abnormal gait   . Allergic rhinitis   . Anxiety   . Atrial fibrillation ([place])   . Carcinomas, basal cell   . Cardiac pacemaker    DDD  MDT  . Cerebrovascular disease   . Clotting disorder (HCC)    DVT  . Depression   . Dysautonomia ([place])   . Ehler's-Danlos syndrome   . Fibromyalgia   . Headache(784.0)    Migraine  . Heart murmur   . Hemorrhoids   . Herpes zoster   . History of syncope   . History of tachycardia-bradycardia syndrome   . History of transient ischemic attack   . Hypercholesteremia   . Hypercholesterolemia   . Hypothyroidism   . Long term (current) use of anticoagulants   . Migraine with aura 06/21/2017   Episodes of aphasia and confusion  . Mitral valve prolapse   . PAT (paroxysmal atrial tachycardia) ([place])   . PSVT (paroxysmal  supraventricular tachycardia) ([place])   . Skin desquamation    inflammative vaginitis  . Stroke ([place])   . SVT (supraventricular tachycardia) (HCC)    Past Surgical History:  Procedure Laterality Date  . ABLATION     x 2  . CAROTID ENDARTERECTOMY    . complex mitral valve repair     at the [facility]  . INSERT / REPLACE / REMOVE PACEMAKER    . LEAD REVISION    . left carotid endarterectomy  11/2003   by Dr. Delton See  . PACEMAKER INSERTION     medtronic kappa 901  . removed chalazion from left eye lid  09/2011   Dr. Satira Sark   Family History  Problem Relation Age of Onset  . Lung cancer Mother   . Prostate cancer Father   . Alzheimer's disease Father   . Heart disease Father   . Ehlers-Danlos syndrome Father   . Mitral valve prolapse Brother   . Diabetes Sister   . Mitral valve prolapse Sister   . Leukemia Other        Nephew  . Heart disease Other        entire maternal side of family  . Cancer Other        entire maternal side of family   Social History   Socioeconomic History  . Marital status: Married    Spouse name: Fritz Pickerel  . Number of children: 0  . Years of education: S-College  . Highest education level: Not on file  Occupational History  . Occupation: retired    Fish farm manager: RETIRED  Social Needs  . Financial resource strain: Not on file  . Food insecurity    Worry: Not on file    Inability: Not on file  . Transportation needs    Medical: Not on file    Non-medical: Not on file  Tobacco Use  . Smoking status: Never Smoker  . Smokeless tobacco: Never Used  . Tobacco comment: smoked in her early 20's--smoked 4 cigs per year   Substance and Sexual Activity  . Alcohol use: No    Alcohol/week: 0.0 standard drinks    Comment: 2 drinks per year  . Drug use: No  . Sexual activity: Not on file  Lifestyle  . Physical activity    Days per week: Not on file    Minutes per session: Not on file  . Stress: Not on file  Relationships  . Social Product manager on phone: Not on file    Gets together: Not on file    Attends religious service: Not on file    Active member of club or organization: Not on file    Attends meetings of clubs or organizations: Not on file  Relationship status: Not on file  Other Topics Concern  . Not on file  Social History Narrative   Patient drinks about 2 cups of caffeine daily.   Patient is right handed.     Tobacco Counseling Counseling given: Not Answered Comment: smoked in her early 20's--smoked 4 cigs per year    Clinical Intake: Pain : No/denies pain    Activities of Daily Living In your present state of health, do you have any difficulty performing the following activities: 12/04/2018  Hearing? N  Vision? N  Difficulty concentrating or making decisions? N  Walking or climbing stairs? N  Dressing or bathing? N  Doing errands, shopping? N  Preparing Food and eating ? N  Using the Toilet? N  In the past six months, have you accidently leaked urine? N  Do you have problems with loss of bowel control? N  Managing your Medications? N  Managing your Finances? N  Housekeeping or managing your Housekeeping? N  Some recent data might be hidden     Immunizations and Health Maintenance Immunization History  Administered Date(s) Administered  . Fluad Quad(high Dose 65+) 10/07/2018  . Influenza Split 11/21/2010, 11/21/2011  . Influenza Whole 02/14/2007, 10/16/2007, 10/13/2009  . Influenza, High Dose Seasonal PF 12/12/2017  . Influenza,inj,Quad PF,6+ Mos 11/19/2012, 10/06/2013, 11/30/2014, 11/29/2015, 10/03/2016  . Pneumococcal Conjugate-13 05/31/2015  . Pneumococcal Polysaccharide-23 01/24/2003, 01/07/2018  . Td 04/16/2009   Health Maintenance Due  Topic Date Due  . DEXA SCAN  08/16/2017    Patient Care Team: Libby Maw, MD as PCP - General (Family Medicine) Josue Hector, MD as PCP - Cardiology (Cardiology) Deboraha Sprang, MD as Consulting Physician (Cardiology)   Indicate any recent Medical Services you may have received from other than [facility] providers in the past year (date may be approximate).     Assessment:   This is a routine wellness examination for Azadeh. Physical assessment deferred to PCP.  Hearing/Vision screen Unable to assess. This visit is enabled though telemedicine due to Covid 19.   Dietary issues and exercise activities discussed: Current Exercise Habits: The patient does not participate in regular exercise at present, Exercise limited by: None identified Diet (meal preparation, eat out, water intake, caffeinated beverages, dairy products, fruits and vegetables): well balanced      Goals    . Increase physical activity     Pt is going to start doing workout videos found on youtube.      Depression Screen PHQ 2/9 Scores 12/04/2018 06/30/2016 05/01/2016  PHQ - 2 Score 0 0 0    Fall Risk Fall Risk  12/04/2018 04/27/2016  Falls in the past year? 1 Yes  Number falls in past yr: 0 1  Injury with Fall? 0 Yes  Comment - broke  L foot  Risk Factor Category  - High Fall Risk  Risk for fall due to : Impaired balance/gait Impaired balance/gait  Risk for fall due to: Comment - dizziness and lightheadedness  Follow up Education provided;Falls prevention discussed -    Cognitive Function: Ad8 score reviewed for issues:  Issues making decisions:no  Less interest in hobbies / activities:no  Repeats questions, stories (family complaining):no  Trouble using ordinary gadgets (microwave, computer, phone):no  Forgets the month or year: no  Mismanaging finances: no  Remembering appts:no  Daily problems with thinking and/or memory:no Ad8 score is=0     MMSE - Mini Mental State Exam 06/21/2017 06/20/2016 12/23/2015 06/15/2015 12/11/2014  Orientation to time 5 4 4  5  4  Orientation to Place 5 5 5 5 5   Registration 3 3 3 3 3   Attention/ Calculation 4 5 5 5 5   Recall 3 2 2 2 3   Language- name 2 objects 2 2 2 2 2   Language-  repeat 1 1 1 1 1   Language- follow 3 step command 3 3 3 3 3   Language- read & follow direction 1 1 1 1 1   Write a sentence 1 1 1 1 1   Copy design 1 1 1 1 1   Total score 29 28 28 29 29         Screening Tests Health Maintenance  Topic Date Due  . DEXA SCAN  08/16/2017  . TETANUS/TDAP  04/17/2019  . MAMMOGRAM  09/25/2019  . COLONOSCOPY  01/05/2028  . INFLUENZA VACCINE  Completed  . Hepatitis C Screening  Completed  . PNA vac Low Risk Adult  Completed     Plan:    Please schedule your next medicare wellness visit with me in 1 yr.  Continue to eat heart healthy diet (full of fruits, vegetables, whole grains, lean protein, water--limit salt, fat, and sugar intake) and increase physical activity as tolerated.  Continue doing brain stimulating activities (puzzles, reading, adult coloring books, staying active) to keep memory sharp.   Bring a copy of your living will and/or healthcare power of attorney to your next office visit.   I have personally reviewed and noted the following in the patient's chart:   . Medical and social history . Use of alcohol, tobacco or illicit drugs  . Current medications and supplements . Functional ability and status . Nutritional status . Physical activity . Advanced directives . List of other physicians . Hospitalizations, surgeries, and ER visits in previous 12 months . Vitals . Screenings to include cognitive, depression, and falls . Referrals and appointments  In addition, I have reviewed and discussed with patient certain preventive protocols, quality metrics, and best practice recommendations. A written personalized care plan for preventive services as well as general preventive health recommendations were provided to patient.     Shela Nevin, [place]   12/04/2018

## 2018-12-04 ENCOUNTER — Encounter: Payer: Self-pay | Admitting: *Deleted

## 2018-12-04 ENCOUNTER — Ambulatory Visit (INDEPENDENT_AMBULATORY_CARE_PROVIDER_SITE_OTHER): Payer: Medicare Other | Admitting: *Deleted

## 2018-12-04 DIAGNOSIS — Z0000 Encounter for general adult medical examination without abnormal findings: Secondary | ICD-10-CM

## 2018-12-04 NOTE — Patient Instructions (Signed)
Please schedule your next medicare wellness visit with me in 1 yr.  Continue to eat heart healthy diet (full of fruits, vegetables, whole grains, lean protein, water--limit salt, fat, and sugar intake) and increase physical activity as tolerated.  Continue doing brain stimulating activities (puzzles, reading, adult coloring books, staying active) to keep memory sharp.   Bring a copy of your living will and/or healthcare power of attorney to your next office visit.   Ms. Miltenberger , Thank you for taking time to come for your Medicare Wellness Visit. I appreciate your ongoing commitment to your health goals. Please review the following plan we discussed and let me know if I can assist you in the future.   These are the goals we discussed: Goals    . Increase physical activity     Pt is going to start doing workout videos found on youtube.       This is a list of the screening recommended for you and due dates:  Health Maintenance  Topic Date Due  . DEXA scan (bone density measurement)  08/16/2017  . Tetanus Vaccine  04/17/2019  . Mammogram  09/25/2019  . Colon Cancer Screening  01/05/2028  . Flu Shot  Completed  .  Hepatitis C: One time screening is recommended by Center for Disease Control  (CDC) for  adults born from [birth year] through 1963.   Completed  . Pneumonia vaccines  Completed    Preventive Care 1 Years and Older, Female Preventive care refers to lifestyle choices and visits with your health care provider that can promote health and wellness. This includes:  A yearly physical exam. This is also called an annual well check.  Regular dental and eye exams.  Immunizations.  Screening for certain conditions.  Healthy lifestyle choices, such as diet and exercise. What can I expect for my preventive care visit? Physical exam Your health care provider will check:  Height and weight. These may be used to calculate body mass index (BMI), which is a measurement that tells if you  are at a healthy weight.  Heart rate and blood pressure.  Your skin for abnormal spots. Counseling Your health care provider may ask you questions about:  Alcohol, tobacco, and drug use.  Emotional well-being.  Home and relationship well-being.  Sexual activity.  Eating habits.  History of falls.  Memory and ability to understand (cognition).  Work and work Statistician.  Pregnancy and menstrual history. What immunizations do I need?  Influenza (flu) vaccine  This is recommended every year. Tetanus, diphtheria, and pertussis (Tdap) vaccine  You may need a Td booster every 10 years. Varicella (chickenpox) vaccine  You may need this vaccine if you have not already been vaccinated. Zoster (shingles) vaccine  You may need this after age 53. Pneumococcal conjugate (PCV13) vaccine  One dose is recommended after age 50. Pneumococcal polysaccharide (PPSV23) vaccine  One dose is recommended after age 85. Measles, mumps, and rubella (MMR) vaccine  You may need at least one dose of MMR if you were born in 1957 or later. You may also need a second dose. Meningococcal conjugate (MenACWY) vaccine  You may need this if you have certain conditions. Hepatitis A vaccine  You may need this if you have certain conditions or if you travel or work in places where you may be exposed to hepatitis A. Hepatitis B vaccine  You may need this if you have certain conditions or if you travel or work in places where you may be exposed  to hepatitis B. Haemophilus influenzae type b (Hib) vaccine  You may need this if you have certain conditions. You may receive vaccines as individual doses or as more than one vaccine together in one shot (combination vaccines). Talk with your health care provider about the risks and benefits of combination vaccines. What tests do I need? Blood tests  Lipid and cholesterol levels. These may be checked every 5 years, or more frequently depending on your  overall health.  Hepatitis C test.  Hepatitis B test. Screening  Lung cancer screening. You may have this screening every year starting at age 30 if you have a 30-pack-year history of smoking and currently smoke or have quit within the past 15 years.  Colorectal cancer screening. All adults should have this screening starting at age 64 and continuing until age 64. Your health care provider may recommend screening at age 92 if you are at increased risk. You will have tests every 1-10 years, depending on your results and the type of screening test.  Diabetes screening. This is done by checking your blood sugar (glucose) after you have not eaten for a while (fasting). You may have this done every 1-3 years.  Mammogram. This may be done every 1-2 years. Talk with your health care provider about how often you should have regular mammograms.  BRCA-related cancer screening. This may be done if you have a family history of breast, ovarian, tubal, or peritoneal cancers. Other tests  Sexually transmitted disease (STD) testing.  Bone density scan. This is done to screen for osteoporosis. You may have this done starting at age 9. Follow these instructions at home: Eating and drinking  Eat a diet that includes fresh fruits and vegetables, whole grains, lean protein, and low-fat dairy products. Limit your intake of foods with high amounts of sugar, saturated fats, and salt.  Take vitamin and mineral supplements as recommended by your health care provider.  Do not drink alcohol if your health care provider tells you not to drink.  If you drink alcohol: ? Limit how much you have to 0-1 drink a day. ? Be aware of how much alcohol is in your drink. In the [place] one drink equals one 12 oz bottle of beer (355 mL), one 5 oz glass of wine (148 mL), or one 1 oz glass of hard liquor (44 mL). Lifestyle  Take daily care of your teeth and gums.  Stay active. Exercise for at least 30 minutes on 5 or more  days each week.  Do not use any products that contain nicotine or tobacco, such as cigarettes, e-cigarettes, and chewing tobacco. If you need help quitting, ask your health care provider.  If you are sexually active, practice safe sex. Use a condom or other form of protection in order to prevent STIs (sexually transmitted infections).  Talk with your health care provider about taking a low-dose aspirin or statin. What's next?  Go to your health care provider once a year for a well check visit.  Ask your health care provider how often you should have your eyes and teeth checked.  Stay up to date on all vaccines. This information is not intended to replace advice given to you by your health care provider. Make sure you discuss any questions you have with your health care provider. Document Released: 02/05/2015 Document Revised: 01/03/2018 Document Reviewed: 01/03/2018 Elsevier Patient Education  2020 [employer].

## 2018-12-30 ENCOUNTER — Ambulatory Visit: Payer: Medicare Other | Attending: Neurology | Admitting: Rehabilitation

## 2018-12-30 DIAGNOSIS — Z9181 History of falling: Secondary | ICD-10-CM | POA: Insufficient documentation

## 2018-12-30 DIAGNOSIS — R2681 Unsteadiness on feet: Secondary | ICD-10-CM | POA: Insufficient documentation

## 2018-12-30 DIAGNOSIS — M6281 Muscle weakness (generalized): Secondary | ICD-10-CM | POA: Insufficient documentation

## 2018-12-30 DIAGNOSIS — R262 Difficulty in walking, not elsewhere classified: Secondary | ICD-10-CM | POA: Insufficient documentation

## 2019-01-02 ENCOUNTER — Other Ambulatory Visit: Payer: Self-pay

## 2019-01-02 ENCOUNTER — Ambulatory Visit
Admission: RE | Admit: 2019-01-02 | Discharge: 2019-01-02 | Disposition: A | Discharge status: Home / Self Care | Payer: Medicare Other | Source: Ambulatory Visit | Attending: Family Medicine | Admitting: Family Medicine

## 2019-01-02 DIAGNOSIS — E2839 Other primary ovarian failure: Secondary | ICD-10-CM

## 2019-01-03 ENCOUNTER — Other Ambulatory Visit: Payer: Self-pay

## 2019-01-03 ENCOUNTER — Ambulatory Visit: Payer: Medicare Other | Admitting: Physical Therapy

## 2019-01-03 ENCOUNTER — Encounter: Payer: Self-pay | Admitting: Physical Therapy

## 2019-01-03 DIAGNOSIS — Z9181 History of falling: Secondary | ICD-10-CM | POA: Diagnosis present

## 2019-01-03 DIAGNOSIS — R2681 Unsteadiness on feet: Secondary | ICD-10-CM | POA: Diagnosis not present

## 2019-01-03 DIAGNOSIS — R262 Difficulty in walking, not elsewhere classified: Secondary | ICD-10-CM

## 2019-01-03 DIAGNOSIS — M6281 Muscle weakness (generalized): Secondary | ICD-10-CM | POA: Diagnosis present

## 2019-01-03 NOTE — Therapy (Signed)
[facility] [address] [place], [place], [address] Phone: [phone]   Fax:  [phone]  Physical Therapy Evaluation  Patient Details  Name: Amy Lane MRN: [id number] Date of Birth: 03/12/1950 Referring Provider (PT): Kathrynn Ducking, MD   Encounter Date: 01/03/2019  PT End of Session - 01/03/19 1140    Visit Number  1    Number of Visits  17    Date for PT Re-Evaluation  04/03/19   written for 8 week POC   Authorization Type  Medicare    PT Start Time  [phone]   pt arrived late   PT Stop Time  0932    PT Time Calculation (min)  38 min    Equipment Utilized During Treatment  Gait belt    Activity Tolerance  Patient tolerated treatment well    Behavior During Therapy  [facility] for tasks assessed/performed       Past Medical History:  Diagnosis Date  . Abnormal gait   . Allergic rhinitis   . Anxiety   . Atrial fibrillation ([place])   . Carcinomas, basal cell   . Cardiac pacemaker    DDD  MDT  . Cerebrovascular disease   . Clotting disorder (HCC)    DVT  . Depression   . Dysautonomia ([place])   . Ehler's-Danlos syndrome   . Fibromyalgia   . Headache(784.0)    Migraine  . Heart murmur   . Hemorrhoids   . Herpes zoster   . History of syncope   . History of tachycardia-bradycardia syndrome   . History of transient ischemic attack   . Hypercholesteremia   . Hypercholesterolemia   . Hypothyroidism   . Long term (current) use of anticoagulants   . Migraine with aura 06/21/2017   Episodes of aphasia and confusion  . Mitral valve prolapse   . PAT (paroxysmal atrial tachycardia) ([place])   . PSVT (paroxysmal supraventricular tachycardia) ([place])   . Skin desquamation    inflammative vaginitis  . Stroke ([place])   . SVT (supraventricular tachycardia) (HCC)     Past Surgical History:  Procedure Laterality Date  . ABLATION     x 2  . CAROTID ENDARTERECTOMY    . complex mitral valve repair     at the  [facility]  . INSERT / REPLACE / REMOVE PACEMAKER    . LEAD REVISION    . left carotid endarterectomy  11/2003   by Dr. Delton See  . PACEMAKER INSERTION     medtronic kappa 901  . removed chalazion from left eye lid  09/2011   Dr. Satira Sark    There were no vitals filed for this visit.   Subjective Assessment - 01/03/19 0856    Subjective  Has seen Dr. Jannifer Franklin since she was 61. Has had balance issues - started about a decade ago. States that she thinks it has gotten worse recently. Notices that she is on the edge of her right foot when she is walking, when she turns corners sometimes her shoulders hit the door. Has had 2 falls - one fall where she broke her foot, no rugs in the house. Can't keep her balance when standing on one leg to put her on her pajamas.    Pertinent History  Ehlers-Danlos syndrome, POTS, mild cognitive impairment, fibromyalgia, hx of TIA/CVA and migraine headache.    Patient Stated Goals  wants to be able to keep her balance when standing to put on pants/pajamas  Currently in Pain?  No/denies         [facility] PT Assessment - 01/03/19 [id number]      Assessment   Medical Diagnosis  gait instability    Referring Provider (PT)  Kathrynn Ducking, MD    Onset Date/Surgical Date  --   2010, started noticing balance problems, has gotten worse   Hand Dominance  Right    Prior Therapy  cardiac rehab a couple of times, PT for knee and foot      Precautions   Precautions  Fall    Precaution Comments  POTS - last syncopal episode was 2 years ago      Balance Screen   Has the patient fallen in the past 6 months  No    Has the patient had a decrease in activity level because of a fear of falling?   Yes    Is the patient reluctant to leave their home because of a fear of falling?   No      Home Film/video [employer] residence    Living Arrangements  Spouse/significant other    Type of [place]   temporarily, looking for a new [place]  None    Additional Comments  looking for a 1 level ranch home with no steps       Prior Function   Level of Independence  Independent    Leisure  use to work out 3x week at the gym before covid, loves to travel        Cognition   Overall Cognitive Status  Within Functional Limits for tasks assessed      Sensation   Light Touch  Appears Intact    Hot/Cold  Impaired Detail   can't tell when she is ice cold   Additional Comments  middle two toes on left foot go numb      ROM / Strength   AROM / PROM / Strength  Strength      Strength   Overall Strength Comments  grossly 5/5    Strength Assessment Site  Hip;Knee;Ankle    Right/Left Hip  Right;Left    Right/Left Knee  Right;Left    Right/Left Ankle  Right;Left      Transfers   Five time sit to stand comments   27.69 seconds from standard height chair with no UE support    Comments  increased hip IR B when performing sit > stand. doesn't have the strength to push back up to standing after bending down to pick up something off the floor (started 8 years ago).      Ambulation/Gait   Ambulation/Gait  Yes    Ambulation/Gait Assistance  5: Supervision    Ambulation Distance (Feet)  115 Feet   approx distance around clinic   Assistive device  None    Gait Pattern  Step-through pattern;Decreased arm swing - left;Decreased arm swing - right;Narrow base of support    Ambulation Surface  Level;Indoor    Gait velocity  10.22 seconds = 3.21 ft/sec    Stairs  Yes    Stairs Assistance  5: Supervision    Stair Management Technique  Two rails;Alternating pattern;Forwards    Number of Stairs  4    Height of Stairs  6      Functional Gait  Assessment   Gait assessed   Yes  Gait Level Surface  Walks 20 ft, slow speed, abnormal gait pattern, evidence for imbalance or deviates 10-15 in outside of the 12 in walkway width. Requires more than 7 sec to ambulate 20 ft.    Change in  Gait Speed  Able to change speed, demonstrates mild gait deviations, deviates 6-10 in outside of the 12 in walkway width, or no gait deviations, unable to achieve a major change in velocity, or uses a change in velocity, or uses an assistive device.    Gait with Horizontal Head Turns  Performs head turns with moderate changes in gait velocity, slows down, deviates 10-15 in outside 12 in walkway width but recovers, can continue to walk.    Gait with Vertical Head Turns  Performs task with moderate change in gait velocity, slows down, deviates 10-15 in outside 12 in walkway width but recovers, can continue to walk.    Gait and Pivot Turn  Pivot turns safely within 3 sec and stops quickly with no loss of balance.    Step Over Obstacle  Is able to step over one shoe box (4.5 in total height) without changing gait speed. No evidence of imbalance.    Gait with Narrow Base of Support  Ambulates less than 4 steps heel to toe or cannot perform without assistance.    Gait with Eyes Closed  Cannot walk 20 ft without assistance, severe gait deviations or imbalance, deviates greater than 15 in outside 12 in walkway width or will not attempt task.   extreme veering to R   Ambulating Backwards  Walks 20 ft, slow speed, abnormal gait pattern, evidence for imbalance, deviates 10-15 in outside 12 in walkway width.    Steps  Alternating feet, must use rail.    Total Score  13    FGA comment:  13/30                Objective measurements completed on examination: See above findings.              PT Education - 01/03/19 1140    Education Details  clinical findings, POC, using nightlights at home due to impaired balance/gait with eyes closed    Person(s) Educated  Patient    Methods  Explanation    Comprehension  Verbalized understanding       PT Short Term Goals - 01/03/19 1149      PT SHORT TERM GOAL #1   Title  Pt will verbalize understanding of fall prevention techniques in home  environment. ALL STGS DUE 01/31/19    Time  4    Period  Weeks    Status  New    Target Date  01/31/19      PT SHORT TERM GOAL #2   Title  Patient will be independent with initial HEP in order to build upon functional gains in therapy.    Time  4    Period  Weeks    Status  New      PT SHORT TERM GOAL #3   Title  Patient will participate in further multi-sensory balance assessment (perform mCTSIB), LTG to be written as appropriate.    Time  4    Period  Weeks    Status  New      PT SHORT TERM GOAL #4   Title  Patient will improve FGA score to at least a 16/30 in order to demo decr fall risk.    Baseline  13/30 on 01/03/19    Time  4    Period  Weeks    Status  New      PT SHORT TERM GOAL #5   Title  Patient will decrease 5x sit <> stand time to at least 24 seconds or less without UE support from standard height chair to demo improved LE strength.    Baseline  27.69 seconds    Time  4    Period  Weeks    Status  New        PT Long Term Goals - 01/03/19 1211      PT LONG TERM GOAL #1   Title  Patient will be independent with final HEP in order to build upon functional gains in therapy. ALL LTGS DUE 02/28/2019    Time  8    Period  Weeks    Status  New    Target Date  02/28/19      PT LONG TERM GOAL #2   Title  Patient will improve FGA score to at least a 21/30 in order to demo decr fall risk.    Baseline  13/30 ON 01/03/19    Time  8    Period  Weeks    Status  New      PT LONG TERM GOAL #3   Title  Patient will improve gait speed to at least 3.7 ft/sec to demo improved community mobility.    Baseline  3.21 ft/sec    Time  8    Period  Weeks    Status  New      PT LONG TERM GOAL #4   Title  Patient will be able to perform SLS on BLE for at least 8 seconds each in order to safely perform SLS to don/doff pants and pajamas -pt will also subjectively report this task has gotten easier.    Time  8    Period  Weeks    Status  New      PT LONG TERM GOAL #5   Title   Patient will ambulate at least 230' over indoor/outdoor surfaces with mod I while scanning environment and no LOB in order to demo improved community mobility.    Time  8    Period  Weeks    Status  New             Plan - 01/03/19 1224    Clinical Impression Statement  Patient is a 68 year old female referred to Neuro OPPT for evaluation with primary concern of gait abnormalities/impaired balance. Pt's PMH is significant for: Ehlers-Danlos syndrome, POTS, mild cognitive impairment, fibromyalgia, hx of TIA/CVA and migraine headache. The following deficits were present during the exam: decreased functional LE strength, impaired dynamic balance (eyes closed, narrow BOS, head turns/head nods), gait abnormalities. Pt's FGA scores indicate pt is at a high risk for falls.  Pt would benefit from skilled PT to address these impairments and functional limitations to maximize functional mobility independence    Personal Factors and Comorbidities  Time since onset of injury/illness/exacerbation;Comorbidity 3+;Past/Current Experience    Comorbidities  Ehlers-Danlos syndrome, POTS, mild cognitive impairment, fibromyalgia, hx of TIA/CVA and migraine headache.    Examination-Activity Limitations  Locomotion Level    Examination-Participation Restrictions  Community Activity    Stability/Clinical Decision Making  Stable/Uncomplicated    Clinical Decision Making  Low    Rehab Potential  Good    PT Frequency  2x / week   1-2x per week depending on work schedule   PT Duration  8 weeks    PT Treatment/Interventions  ADLs/Self Care Home Management;Aquatic Therapy;Therapeutic exercise;Therapeutic activities;Stair training;Gait training;DME Instruction;Balance training;Neuromuscular re-education;Patient/family education;Vestibular    PT Next Visit Plan  assess mCTSIB, assess SLS, initial HEP for LE strengthening and balance. has difficulty with tandem stance, eyes closed, backwards walking, SLS. sit <> stands,  fall prevention strategies    Consulted and Agree with Plan of Care  Patient       Patient will benefit from skilled therapeutic intervention in order to improve the following deficits and impairments:  Abnormal gait, Decreased activity tolerance, Decreased balance, Difficulty walking, Decreased strength  Visit Diagnosis: Unsteadiness on feet  Muscle weakness (generalized)  History of falling  Difficulty in walking, not elsewhere classified     Problem List Patient Active Problem List   Diagnosis Date Noted  . Need for influenza vaccination 10/07/2018  . Estrogen deficiency 10/07/2018  . Elevated LDL cholesterol level 10/07/2018  . Anemia 10/07/2018  . Osteopenia of neck of femur 10/07/2018  . Atrophic vulva 02/06/2018  . Atrophy of vagina 02/06/2018  . Pruritus of vagina 02/06/2018  . Eustachian tube dysfunction, bilateral 11/16/2017  . Acute bronchitis 11/16/2017  . Migraine with aura 06/21/2017  . Hyponatremia 06/19/2017  . S/P mitral valve repair 12/28/2016  . Dysthymia 12/28/2016  . SVT (supraventricular tachycardia) ([place])   . Skin desquamation   . PSVT (paroxysmal supraventricular tachycardia) ([place])   . PAT (paroxysmal atrial tachycardia) ([place])   . Mitral valve prolapse   . Long term (current) use of anticoagulants   . Hypercholesterolemia   . Hypercholesteremia   . History of transient ischemic attack   . History of tachycardia-bradycardia syndrome   . History of syncope   . Fibromyalgia   . Cardiac pacemaker   . Carcinomas, basal cell   . Abnormal gait   . Vitamin D deficiency 07/26/2016  . History of DVT (deep vein thrombosis) 05/31/2015  . Bilateral hand pain 04/06/2014  . Memory deficits 03/04/2013  . Mental status change 01/28/2013  . Constipation 04/22/2012  . Aphasia 08/03/2011  . Muscle weakness (generalized) 08/03/2011  . Abnormality of gait 08/03/2011  . Migraine without aura 08/03/2011  . Encounter for therapeutic drug monitoring  08/03/2011  . Alopecia 03/09/2011  . TMJ syndrome 10/27/2010  . CARDIAC PACEMAKER DDD MDT 03/29/2010  . SVT/ PSVT/ PAT 07/28/2008  . ATRIAL FIBRILLATION 06/11/2008  . BRADYCARDIA-TACHYCARDIA SYNDROME 05/16/2007  . Transient cerebral ischemia 05/16/2007  . EHLERS-DANLOS SYNDROME 05/16/2007  . SYNCOPE 05/16/2007  . HYPERCHOLESTEROLEMIA 05/15/2007  . Cerebrovascular disease 05/15/2007  . Hypothyroidism 01/28/2007  . Depression 01/28/2007  . Mitral valve disorder 01/28/2007  . Allergic rhinitis 01/28/2007  . FIBROMYALGIA 01/28/2007  . Dysautonomia ([place]) 01/28/2007    Arliss Journey, PT, DPT  01/03/2019, 12:26 PM  [facility] [address] [place], [place], [address] Phone: [phone]   Fax:  [phone]  Name: Amy Lane MRN: [id number] Date of Birth: 04/04/1950

## 2019-01-11 ENCOUNTER — Other Ambulatory Visit: Payer: Self-pay | Admitting: Cardiovascular Disease

## 2019-01-20 ENCOUNTER — Ambulatory Visit: Payer: Medicare Other | Admitting: Physical Therapy

## 2019-01-20 ENCOUNTER — Other Ambulatory Visit: Payer: Self-pay

## 2019-01-20 DIAGNOSIS — Z9181 History of falling: Secondary | ICD-10-CM

## 2019-01-20 DIAGNOSIS — M6281 Muscle weakness (generalized): Secondary | ICD-10-CM

## 2019-01-20 DIAGNOSIS — R2681 Unsteadiness on feet: Secondary | ICD-10-CM

## 2019-01-20 NOTE — Therapy (Signed)
[facility] [address] [place], [place], [address] Phone: [phone]   Fax:  [phone]  Physical Therapy Treatment  Patient Details  Name: Amy Lane MRN: [id number] Date of Birth: 31-Jan-1950 Referring Provider (PT): Kathrynn Ducking, MD   Encounter Date: 01/20/2019  PT End of Session - 01/20/19 1124    Visit Number  2    Number of Visits  17    Date for PT Re-Evaluation  04/03/19   written for 8 week POC   Authorization Type  Medicare    PT Start Time  [id number]    PT Stop Time  1104    PT Time Calculation (min)  41 min    Equipment Utilized During Treatment  Gait belt    Activity Tolerance  Patient tolerated treatment well    Behavior During Therapy  [facility] for tasks assessed/performed       Past Medical History:  Diagnosis Date  . Abnormal gait   . Allergic rhinitis   . Anxiety   . Atrial fibrillation ([place])   . Carcinomas, basal cell   . Cardiac pacemaker    DDD  MDT  . Cerebrovascular disease   . Clotting disorder (HCC)    DVT  . Depression   . Dysautonomia ([place])   . Ehler's-Danlos syndrome   . Fibromyalgia   . Headache(784.0)    Migraine  . Heart murmur   . Hemorrhoids   . Herpes zoster   . History of syncope   . History of tachycardia-bradycardia syndrome   . History of transient ischemic attack   . Hypercholesteremia   . Hypercholesterolemia   . Hypothyroidism   . Long term (current) use of anticoagulants   . Migraine with aura 06/21/2017   Episodes of aphasia and confusion  . Mitral valve prolapse   . PAT (paroxysmal atrial tachycardia) ([place])   . PSVT (paroxysmal supraventricular tachycardia) ([place])   . Skin desquamation    inflammative vaginitis  . Stroke ([place])   . SVT (supraventricular tachycardia) (HCC)     Past Surgical History:  Procedure Laterality Date  . ABLATION     x 2  . CAROTID ENDARTERECTOMY    . complex mitral valve repair     at the [facility]  .  INSERT / REPLACE / REMOVE PACEMAKER    . LEAD REVISION    . left carotid endarterectomy  11/2003   by Dr. Delton See  . PACEMAKER INSERTION     medtronic kappa 901  . removed chalazion from left eye lid  09/2011   Dr. Satira Sark    There were no vitals filed for this visit.  Subjective Assessment - 01/20/19 1027    Subjective  Was suprised at how off balanced she was on the evaluation.    Pertinent History  Ehlers-Danlos syndrome, POTS, mild cognitive impairment, fibromyalgia, hx of TIA/CVA and migraine headache.    Patient Stated Goals  wants to be able to keep her balance when standing to put on pants/pajamas    Currently in Pain?  No/denies         [facility] Adult PT Treatment/Exercise - 01/20/19 0001      Balance   Balance Assessed  Yes      High Level Balance   High Level Balance Activities  Other (comment)    High Level Balance Comments  mCTSIB performed and pt scored 114/120          Balance Exercises - 01/20/19 1117  Balance Exercises: Standing   Standing Eyes Opened  Narrow base of support (BOS);Wide (BOA);Head turns;Foam/compliant surface;Solid surface;Other reps (comment);Limitations   increased dizziness with EO and head turns   Standing Eyes Closed  Narrow base of support (BOS);Wide (BOA);Head turns;Foam/compliant surface;Solid surface;Other reps (comment)    Tandem Stance  Eyes open;Upper extremity support 2;2 reps;10 secs    SLS  Eyes open;Solid surface;Upper extremity support 1;3 reps;10 secs    Other Standing Exercises  standing with lettter A on wall approx. 7 ft away for head turns/nods while focusing on letter x 10 reps x 2 sets.  Pt reports this did not cause dizziness like head turns in corner without A to focus on      See HEP for details.  Performed HEP as written in pt instructions during session as well.  PT Education - 01/20/19 1123    Education Details  HEP    Person(s) Educated  Patient    Methods  Explanation;Demonstration;Handout     Comprehension  Verbalized understanding;Returned demonstration       PT Short Term Goals - 01/03/19 1149      PT SHORT TERM GOAL #1   Title  Pt will verbalize understanding of fall prevention techniques in home environment. ALL STGS DUE 01/31/19    Time  4    Period  Weeks    Status  New    Target Date  01/31/19      PT SHORT TERM GOAL #2   Title  Patient will be independent with initial HEP in order to build upon functional gains in therapy.    Time  4    Period  Weeks    Status  New      PT SHORT TERM GOAL #3   Title  Patient will participate in further multi-sensory balance assessment (perform mCTSIB), LTG to be written as appropriate.    Time  4    Period  Weeks    Status  New      PT SHORT TERM GOAL #4   Title  Patient will improve FGA score to at least a 16/30 in order to demo decr fall risk.    Baseline  13/30 on 01/03/19    Time  4    Period  Weeks    Status  New      PT SHORT TERM GOAL #5   Title  Patient will decrease 5x sit <> stand time to at least 24 seconds or less without UE support from standard height chair to demo improved LE strength.    Baseline  27.69 seconds    Time  4    Period  Weeks    Status  New        PT Long Term Goals - 01/03/19 1211      PT LONG TERM GOAL #1   Title  Patient will be independent with final HEP in order to build upon functional gains in therapy. ALL LTGS DUE 02/28/2019    Time  8    Period  Weeks    Status  New    Target Date  02/28/19      PT LONG TERM GOAL #2   Title  Patient will improve FGA score to at least a 21/30 in order to demo decr fall risk.    Baseline  13/30 ON 01/03/19    Time  8    Period  Weeks    Status  New      PT LONG TERM  GOAL #3   Title  Patient will improve gait speed to at least 3.7 ft/sec to demo improved community mobility.    Baseline  3.21 ft/sec    Time  8    Period  Weeks    Status  New      PT LONG TERM GOAL #4   Title  Patient will be able to perform SLS on BLE for at least 8  seconds each in order to safely perform SLS to don/doff pants and pajamas -pt will also subjectively report this task has gotten easier.    Time  8    Period  Weeks    Status  New      PT LONG TERM GOAL #5   Title  Patient will ambulate at least 230' over indoor/outdoor surfaces with mod I while scanning environment and no LOB in order to demo improved community mobility.    Time  8    Period  Weeks    Status  New            Plan - 01/20/19 1057    Clinical Impression Statement  Skilled session focused on developing HEP.  Pt did c/o dizziness with head turns and needed to stop after approx 5 head turns.  Continue PT per POC.    Personal Factors and Comorbidities  Time since onset of injury/illness/exacerbation;Comorbidity 3+;Past/Current Experience    Comorbidities  Ehlers-Danlos syndrome, POTS, mild cognitive impairment, fibromyalgia, hx of TIA/CVA and migraine headache.    Examination-Activity Limitations  Locomotion Level    Examination-Participation Restrictions  Community Activity    Stability/Clinical Decision Making  Stable/Uncomplicated    Rehab Potential  Good    PT Frequency  2x / week   1-2x per week depending on work schedule   PT Duration  8 weeks    PT Treatment/Interventions  ADLs/Self Care Home Management;Aquatic Therapy;Therapeutic exercise;Therapeutic activities;Stair training;Gait training;DME Instruction;Balance training;Neuromuscular re-education;Patient/family education;Vestibular    PT Next Visit Plan  Review (and revise if needed ) HEP for balance.  Add LE strengthening to HEP. Progress balance with tandem gait, backwards walking, SLS. sit <> stands, fall prevention strategies    PT Home Exercise Plan  Access Code: [email or web address] URL: [email or web address]    Consulted and Agree with Plan of Care  Patient       Patient will benefit from skilled therapeutic intervention in order to improve the following deficits and impairments:  Abnormal gait,  Decreased activity tolerance, Decreased balance, Difficulty walking, Decreased strength  Visit Diagnosis: Unsteadiness on feet  Muscle weakness (generalized)  History of falling     Problem List Patient Active Problem List   Diagnosis Date Noted  . Need for influenza vaccination 10/07/2018  . Estrogen deficiency 10/07/2018  . Elevated LDL cholesterol level 10/07/2018  . Anemia 10/07/2018  . Osteopenia of neck of femur 10/07/2018  . Atrophic vulva 02/06/2018  . Atrophy of vagina 02/06/2018  . Pruritus of vagina 02/06/2018  . Eustachian tube dysfunction, bilateral 11/16/2017  . Acute bronchitis 11/16/2017  . Migraine with aura 06/21/2017  . Hyponatremia 06/19/2017  . S/P mitral valve repair 12/28/2016  . Dysthymia 12/28/2016  . SVT (supraventricular tachycardia) ([place])   . Skin desquamation   . PSVT (paroxysmal supraventricular tachycardia) ([place])   . PAT (paroxysmal atrial tachycardia) ([place])   . Mitral valve prolapse   . Long term (current) use of anticoagulants   . Hypercholesterolemia   . Hypercholesteremia   . History of transient ischemic attack   . History of  tachycardia-bradycardia syndrome   . History of syncope   . Fibromyalgia   . Cardiac pacemaker   . Carcinomas, basal cell   . Abnormal gait   . Vitamin D deficiency 07/26/2016  . History of DVT (deep vein thrombosis) 05/31/2015  . Bilateral hand pain 04/06/2014  . Memory deficits 03/04/2013  . Mental status change 01/28/2013  . Constipation 04/22/2012  . Aphasia 08/03/2011  . Muscle weakness (generalized) 08/03/2011  . Abnormality of gait 08/03/2011  . Migraine without aura 08/03/2011  . Encounter for therapeutic drug monitoring 08/03/2011  . Alopecia 03/09/2011  . TMJ syndrome 10/27/2010  . CARDIAC PACEMAKER DDD MDT 03/29/2010  . SVT/ PSVT/ PAT 07/28/2008  . ATRIAL FIBRILLATION 06/11/2008  . BRADYCARDIA-TACHYCARDIA SYNDROME 05/16/2007  . Transient cerebral ischemia 05/16/2007  . EHLERS-DANLOS  SYNDROME 05/16/2007  . SYNCOPE 05/16/2007  . HYPERCHOLESTEROLEMIA 05/15/2007  . Cerebrovascular disease 05/15/2007  . Hypothyroidism 01/28/2007  . Depression 01/28/2007  . Mitral valve disorder 01/28/2007  . Allergic rhinitis 01/28/2007  . FIBROMYALGIA 01/28/2007  . Dysautonomia ([place]) 01/28/2007    Narda Bonds, PTA [place] 01/20/19 11:28 AM Phone: [phone] Fax: [facility] [address] [place], [place], [address] Phone: [phone]   Fax:  [phone]  Name: Amy Lane MRN: [id number] Date of Birth: 09-04-1950

## 2019-01-20 NOTE — Patient Instructions (Signed)
Access Code: [email or web address]  URL: [email or web address]  Date: 01/20/2019  Prepared by: Nita Sells   Exercises  Standing Near Stance in [place] with Eyes Closed - 3 sets - 10 second hold - 2x daily - 7x weekly  Standing Balance with Eyes Closed on Foam - 3 sets - 10 seconds hold - 2x daily - 7x weekly  Standing with Head Rotation - 10 reps - 3 sets - 2x daily - 7x weekly  Wide Stance with Eyes Closed and Head Nods - 10 reps - 3 sets - 2x daily - 7x weekly  Standing Tandem Balance with Counter Support - 3 sets - 10 seconds hold - 2x daily - 7x weekly  Single Leg Stance with Support - 3 sets - 10 second hold - 2x daily - 7x weekly

## 2019-01-27 ENCOUNTER — Ambulatory Visit: Payer: Medicare Other | Attending: Neurology | Admitting: Physical Therapy

## 2019-01-27 ENCOUNTER — Other Ambulatory Visit: Payer: Self-pay

## 2019-01-27 DIAGNOSIS — M6281 Muscle weakness (generalized): Secondary | ICD-10-CM

## 2019-01-27 DIAGNOSIS — R262 Difficulty in walking, not elsewhere classified: Secondary | ICD-10-CM | POA: Insufficient documentation

## 2019-01-27 DIAGNOSIS — Z9181 History of falling: Secondary | ICD-10-CM

## 2019-01-27 DIAGNOSIS — R2681 Unsteadiness on feet: Secondary | ICD-10-CM | POA: Diagnosis not present

## 2019-01-27 NOTE — Therapy (Signed)
[facility] [address] [place], [place], [address] Phone: [phone]   Fax:  [phone]  Physical Therapy Treatment  Patient Details  Name: Amy Lane MRN: [id number] Date of Birth: Jun 22, 1950 Referring Provider (PT): Kathrynn Ducking, MD   Encounter Date: 01/27/2019  PT End of Session - 01/27/19 1736    Visit Number  3    Number of Visits  17    Date for PT Re-Evaluation  04/03/19   written for 8 week POC   Authorization Type  Medicare    PT Start Time  1446    PT Stop Time  1529    PT Time Calculation (min)  43 min    Equipment Utilized During Treatment  Gait belt    Activity Tolerance  Patient tolerated treatment well    Behavior During Therapy  [facility] for tasks assessed/performed       Past Medical History:  Diagnosis Date  . Abnormal gait   . Allergic rhinitis   . Anxiety   . Atrial fibrillation ([place])   . Carcinomas, basal cell   . Cardiac pacemaker    DDD  MDT  . Cerebrovascular disease   . Clotting disorder (HCC)    DVT  . Depression   . Dysautonomia ([place])   . Ehler's-Danlos syndrome   . Fibromyalgia   . Headache(784.0)    Migraine  . Heart murmur   . Hemorrhoids   . Herpes zoster   . History of syncope   . History of tachycardia-bradycardia syndrome   . History of transient ischemic attack   . Hypercholesteremia   . Hypercholesterolemia   . Hypothyroidism   . Long term (current) use of anticoagulants   . Migraine with aura 06/21/2017   Episodes of aphasia and confusion  . Mitral valve prolapse   . PAT (paroxysmal atrial tachycardia) ([place])   . PSVT (paroxysmal supraventricular tachycardia) ([place])   . Skin desquamation    inflammative vaginitis  . Stroke ([place])   . SVT (supraventricular tachycardia) (HCC)     Past Surgical History:  Procedure Laterality Date  . ABLATION     x 2  . CAROTID ENDARTERECTOMY    . complex mitral valve repair     at the [facility]  . INSERT  / REPLACE / REMOVE PACEMAKER    . LEAD REVISION    . left carotid endarterectomy  11/2003   by Dr. Delton See  . PACEMAKER INSERTION     medtronic kappa 901  . removed chalazion from left eye lid  09/2011   Dr. Satira Sark    There were no vitals filed for this visit.  Subjective Assessment - 01/27/19 1448    Subjective  Has been doing the exercises, states that they are getting better.    Pertinent History  Ehlers-Danlos syndrome, POTS, mild cognitive impairment, fibromyalgia, hx of TIA/CVA and migraine headache.    Patient Stated Goals  wants to be able to keep her balance when standing to put on pants/pajamas    Currently in Pain?  No/denies                       [facility] Adult PT Treatment/Exercise - 01/27/19 0001      Transfers   Comments  1 x 10 reps from higher mat table with no UE support, added to HEP      Neuro Re-ed    Neuro Re-ed Details   Tandem walking at countertop  down and back 2 reps with no UE support. On red mat: slow marching for increased SLS down and back 2 reps, SLS step taps to single cone approx.. 1 x 15 reps B - min guard for balance. Pt with increased difficulty with SLS on R. Foam balance in corner wider BOS with eyes closed 2 x 30 seconds, progressing to feet hip width apart, multiple reps, longest time able to hold was 20 seconds. On blue foam beam: side stepping down and back 2 reps with no UE support, alternating forward and posterior stepping strategy 1 x 10 reps each, lateral weight shifting with reaching up towards cabinet 1 x 10 reps, cues for weight shifting.              PT Education - 01/27/19 1739    Education Details  addition of sit <> stands to [employer]) Educated  Patient    Methods  Explanation;Demonstration    Comprehension  Verbalized understanding;Returned demonstration       PT Short Term Goals - 01/03/19 1149      PT SHORT TERM GOAL #1   Title  Pt will verbalize understanding of fall prevention techniques in  home environment. ALL STGS DUE 01/31/19    Time  4    Period  Weeks    Status  New    Target Date  01/31/19      PT SHORT TERM GOAL #2   Title  Patient will be independent with initial HEP in order to build upon functional gains in therapy.    Time  4    Period  Weeks    Status  New      PT SHORT TERM GOAL #3   Title  Patient will participate in further multi-sensory balance assessment (perform mCTSIB), LTG to be written as appropriate.    Time  4    Period  Weeks    Status  New      PT SHORT TERM GOAL #4   Title  Patient will improve FGA score to at least a 16/30 in order to demo decr fall risk.    Baseline  13/30 on 01/03/19    Time  4    Period  Weeks    Status  New      PT SHORT TERM GOAL #5   Title  Patient will decrease 5x sit <> stand time to at least 24 seconds or less without UE support from standard height chair to demo improved LE strength.    Baseline  27.69 seconds    Time  4    Period  Weeks    Status  New        PT Long Term Goals - 01/03/19 1211      PT LONG TERM GOAL #1   Title  Patient will be independent with final HEP in order to build upon functional gains in therapy. ALL LTGS DUE 02/28/2019    Time  8    Period  Weeks    Status  New    Target Date  02/28/19      PT LONG TERM GOAL #2   Title  Patient will improve FGA score to at least a 21/30 in order to demo decr fall risk.    Baseline  13/30 ON 01/03/19    Time  8    Period  Weeks    Status  New      PT LONG TERM GOAL #3   Title  Patient will improve gait speed to at least 3.7 ft/sec to demo improved community mobility.    Baseline  3.21 ft/sec    Time  8    Period  Weeks    Status  New      PT LONG TERM GOAL #4   Title  Patient will be able to perform SLS on BLE for at least 8 seconds each in order to safely perform SLS to don/doff pants and pajamas -pt will also subjectively report this task has gotten easier.    Time  8    Period  Weeks    Status  New      PT LONG TERM GOAL #5    Title  Patient will ambulate at least 230' over indoor/outdoor surfaces with mod I while scanning environment and no LOB in order to demo improved community mobility.    Time  8    Period  Weeks    Status  New            Plan - 01/27/19 1737    Clinical Impression Statement  Today's skilled session focused on balance strategies, primarily on compliant surfaces. Also added in sit <> stands to pt's HEP. Pt needing min guard for majority of balance activities and intermittent UE support. Pt unable to hold feet apart eyes closed on foam for greater than 20 seconds, indicating decreased vestibular input for balance. Will continue to progress towards LTGs.    Personal Factors and Comorbidities  Time since onset of injury/illness/exacerbation;Comorbidity 3+;Past/Current Experience    Comorbidities  Ehlers-Danlos syndrome, POTS, mild cognitive impairment, fibromyalgia, hx of TIA/CVA and migraine headache.    Examination-Activity Limitations  Locomotion Level    Examination-Participation Restrictions  Community Activity    Stability/Clinical Decision Making  Stable/Uncomplicated    Rehab Potential  Good    PT Frequency  2x / week   1-2x per week depending on work schedule   PT Duration  8 weeks    PT Treatment/Interventions  ADLs/Self Care Home Management;Aquatic Therapy;Therapeutic exercise;Therapeutic activities;Stair training;Gait training;DME Instruction;Balance training;Neuromuscular re-education;Patient/family education;Vestibular    PT Next Visit Plan  Review (and revise if needed ) HEP for balance.   Progress balance with tandem gait, backwards walking, SLS. sit <> stands, fall prevention strategies    PT Home Exercise Plan  Access Code: [email or web address] URL: [email or web address]    Consulted and Agree with Plan of Care  Patient       Patient will benefit from skilled therapeutic intervention in order to improve the following deficits and impairments:  Abnormal gait, Decreased  activity tolerance, Decreased balance, Difficulty walking, Decreased strength  Visit Diagnosis: Unsteadiness on feet  Muscle weakness (generalized)  History of falling     Problem List Patient Active Problem List   Diagnosis Date Noted  . Need for influenza vaccination 10/07/2018  . Estrogen deficiency 10/07/2018  . Elevated LDL cholesterol level 10/07/2018  . Anemia 10/07/2018  . Osteopenia of neck of femur 10/07/2018  . Atrophic vulva 02/06/2018  . Atrophy of vagina 02/06/2018  . Pruritus of vagina 02/06/2018  . Eustachian tube dysfunction, bilateral 11/16/2017  . Acute bronchitis 11/16/2017  . Migraine with aura 06/21/2017  . Hyponatremia 06/19/2017  . S/P mitral valve repair 12/28/2016  . Dysthymia 12/28/2016  . SVT (supraventricular tachycardia) ([place])   . Skin desquamation   . PSVT (paroxysmal supraventricular tachycardia) ([place])   . PAT (paroxysmal atrial tachycardia) ([place])   . Mitral valve prolapse   . Long term (current) use of  anticoagulants   . Hypercholesterolemia   . Hypercholesteremia   . History of transient ischemic attack   . History of tachycardia-bradycardia syndrome   . History of syncope   . Fibromyalgia   . Cardiac pacemaker   . Carcinomas, basal cell   . Abnormal gait   . Vitamin D deficiency 07/26/2016  . History of DVT (deep vein thrombosis) 05/31/2015  . Bilateral hand pain 04/06/2014  . Memory deficits 03/04/2013  . Mental status change 01/28/2013  . Constipation 04/22/2012  . Aphasia 08/03/2011  . Muscle weakness (generalized) 08/03/2011  . Abnormality of gait 08/03/2011  . Migraine without aura 08/03/2011  . Encounter for therapeutic drug monitoring 08/03/2011  . Alopecia 03/09/2011  . TMJ syndrome 10/27/2010  . CARDIAC PACEMAKER DDD MDT 03/29/2010  . SVT/ PSVT/ PAT 07/28/2008  . ATRIAL FIBRILLATION 06/11/2008  . BRADYCARDIA-TACHYCARDIA SYNDROME 05/16/2007  . Transient cerebral ischemia 05/16/2007  . EHLERS-DANLOS SYNDROME  05/16/2007  . SYNCOPE 05/16/2007  . HYPERCHOLESTEROLEMIA 05/15/2007  . Cerebrovascular disease 05/15/2007  . Hypothyroidism 01/28/2007  . Depression 01/28/2007  . Mitral valve disorder 01/28/2007  . Allergic rhinitis 01/28/2007  . FIBROMYALGIA 01/28/2007  . Dysautonomia ([place]) 01/28/2007    Arliss Journey, PT, DPT  01/27/2019, 5:41 PM  [facility] [address] [place], [place], [address] Phone: [phone]   Fax:  [phone]  Name: Amy Lane MRN: [id number] Date of Birth: August 22, 1950

## 2019-01-27 NOTE — Patient Instructions (Signed)
Access Code: [email or web address]  URL: [email or web address]  Date: 01/27/2019  Prepared by: Janann August   Exercises Standing Near Stance in [place] with Eyes Closed - 3 sets - 10 second hold - 2x daily - 7x weekly Standing Balance with Eyes Closed on Foam - 3 sets - 10 seconds hold - 2x daily - 7x weekly Standing with Head Rotation - 10 reps - 3 sets - 2x daily - 7x weekly Wide Stance with Eyes Closed and Head Nods - 10 reps - 3 sets - 2x daily - 7x weekly Standing Tandem Balance with Counter Support - 3 sets - 10 seconds hold - 2x daily - 7x weekly Single Leg Stance with Support - 3 sets - 10 second hold - 2x daily - 7x weekly Sit to Stand - 10 reps - 2 sets - 2x daily - 7x weekly

## 2019-01-31 ENCOUNTER — Ambulatory Visit: Payer: Medicare Other | Admitting: Physical Therapy

## 2019-02-03 ENCOUNTER — Other Ambulatory Visit: Payer: Self-pay

## 2019-02-03 ENCOUNTER — Ambulatory Visit: Payer: Medicare Other | Admitting: Physical Therapy

## 2019-02-03 ENCOUNTER — Encounter: Payer: Self-pay | Admitting: Physical Therapy

## 2019-02-03 DIAGNOSIS — Z9181 History of falling: Secondary | ICD-10-CM | POA: Diagnosis not present

## 2019-02-03 DIAGNOSIS — R262 Difficulty in walking, not elsewhere classified: Secondary | ICD-10-CM | POA: Diagnosis not present

## 2019-02-03 DIAGNOSIS — M6281 Muscle weakness (generalized): Secondary | ICD-10-CM | POA: Diagnosis not present

## 2019-02-03 DIAGNOSIS — R2681 Unsteadiness on feet: Secondary | ICD-10-CM

## 2019-02-03 NOTE — Therapy (Signed)
[facility] [address] [place], [place], [address] Phone: [phone]   Fax:  [phone]  Physical Therapy Treatment  Patient Details  Name: Amy Lane MRN: [id number] Date of Birth: [birth year] Referring Provider (PT): Kathrynn Ducking, MD   Encounter Date: 02/03/2019  PT End of Session - 02/03/19 2055    Visit Number  4    Number of Visits  17    Date for PT Re-Evaluation  04/03/19   written for 8 week POC   Authorization Type  Medicare    PT Start Time  1126   Pt arrived late from another MD appt   PT Stop Time  1204    PT Time Calculation (min)  38 min    Equipment Utilized During Treatment  Gait belt    Activity Tolerance  Patient tolerated treatment well    Behavior During Therapy  [facility] for tasks assessed/performed       Past Medical History:  Diagnosis Date  . Abnormal gait   . Allergic rhinitis   . Anxiety   . Atrial fibrillation ([place])   . Carcinomas, basal cell   . Cardiac pacemaker    DDD  MDT  . Cerebrovascular disease   . Clotting disorder (HCC)    DVT  . Depression   . Dysautonomia ([place])   . Ehler's-Danlos syndrome   . Fibromyalgia   . Headache(784.0)    Migraine  . Heart murmur   . Hemorrhoids   . Herpes zoster   . History of syncope   . History of tachycardia-bradycardia syndrome   . History of transient ischemic attack   . Hypercholesteremia   . Hypercholesterolemia   . Hypothyroidism   . Long term (current) use of anticoagulants   . Migraine with aura 06/21/2017   Episodes of aphasia and confusion  . Mitral valve prolapse   . PAT (paroxysmal atrial tachycardia) ([place])   . PSVT (paroxysmal supraventricular tachycardia) ([place])   . Skin desquamation    inflammative vaginitis  . Stroke ([place])   . SVT (supraventricular tachycardia) (HCC)     Past Surgical History:  Procedure Laterality Date  . ABLATION     x 2  . CAROTID ENDARTERECTOMY    . complex mitral valve  repair     at the [facility]  . INSERT / REPLACE / REMOVE PACEMAKER    . LEAD REVISION    . left carotid endarterectomy  11/2003   by Dr. Delton See  . PACEMAKER INSERTION     medtronic kappa 901  . removed chalazion from left eye lid  09/2011   Dr. Satira Sark    There were no vitals filed for this visit.  Subjective Assessment - 02/03/19 1127    Subjective  Was running late due to yearly doctor's appt running late today.    Pertinent History  Ehlers-Danlos syndrome, POTS, mild cognitive impairment, fibromyalgia, hx of TIA/CVA and migraine headache.    Patient Stated Goals  wants to be able to keep her balance when standing to put on pants/pajamas    Currently in Pain?  Yes    Pain Score  4     Pain Location  Knee    Pain Orientation  Left;Right    Pain Descriptors / Indicators  Aching    Pain Type  Chronic pain    Pain Onset  More than a month ago    Pain Frequency  Intermittent    Aggravating Factors   sit  to stand exercise    Pain Relieving Factors  nothing particular                       [facility] Adult PT Treatment/Exercise - 02/03/19 0001      Transfers   Transfers  Sit to Stand;Stand to Sit    Sit to Stand  6: Modified independent (Device/Increase time);Without upper extremity assist;From elevated surface    Stand to Sit  6: Modified independent (Device/Increase time);To elevated surface;Without upper extremity assist    Number of Reps  10 reps    Comments  Reviewed HEP, and advised patient that she can perform sit<>stand at bed height, to lessen strain on knees from regular height chair          Balance Exercises - 02/03/19 1138      Balance Exercises: Standing   Standing Eyes Opened  Wide ([place]);Solid surface;Foam/compliant surface;Head turns;5 reps;Narrow base of support (BOS)   Head nods   Standing Eyes Closed  Wide (BOA);Narrow base of support (BOS);Solid surface;Foam/compliant surface;2 reps;10 secs   Cues for use of visual target prior to  closing eyes   Wall Bumps  Hip;10 reps;Eyes opened   2 sets   Stepping Strategy  Lateral;Posterior;UE support;10 reps    Rockerboard  Anterior/posterior;Head turns;EO   Ankle/hip strategy, arm swing x 10 reps each; EC 10 sec   Tandem Gait  Forward;4 reps;Intermittent upper extremity support    Retro Gait  Upper extremity support;2 reps   Fwd/back along counter   Sidestepping  3 reps   along counter   Heel Raises  Both;10 reps   Ankle strategy work with UE support   Toe Raise  10 reps;Both   Ankle strategy work         PT Short Term Goals - 02/03/19 2101      PT SHORT TERM GOAL #1   Title  Pt will verbalize understanding of fall prevention techniques in home environment. ALL STGS DUE 02/14/19 (Date extended due to delayed start)    Time  4    Period  Weeks    Status  New      PT SHORT TERM GOAL #2   Title  Patient will be independent with initial HEP in order to build upon functional gains in therapy.    Time  4    Period  Weeks    Status  New      PT SHORT TERM GOAL #3   Title  Patient will participate in further multi-sensory balance assessment (perform mCTSIB), LTG to be written as appropriate.    Time  4    Period  Weeks    Status  New      PT SHORT TERM GOAL #4   Title  Patient will improve FGA score to at least a 16/30 in order to demo decr fall risk.    Baseline  13/30 on 01/03/19    Time  4    Period  Weeks    Status  New      PT SHORT TERM GOAL #5   Title  Patient will decrease 5x sit <> stand time to at least 24 seconds or less without UE support from standard height chair to demo improved LE strength.    Baseline  27.69 seconds    Time  4    Period  Weeks    Status  New        PT Long Term Goals - 01/03/19 1211  PT LONG TERM GOAL #1   Title  Patient will be independent with final HEP in order to build upon functional gains in therapy. ALL LTGS DUE 02/28/2019    Time  8    Period  Weeks    Status  New    Target Date  02/28/19      PT LONG  TERM GOAL #2   Title  Patient will improve FGA score to at least a 21/30 in order to demo decr fall risk.    Baseline  13/30 ON 01/03/19    Time  8    Period  Weeks    Status  New      PT LONG TERM GOAL #3   Title  Patient will improve gait speed to at least 3.7 ft/sec to demo improved community mobility.    Baseline  3.21 ft/sec    Time  8    Period  Weeks    Status  New      PT LONG TERM GOAL #4   Title  Patient will be able to perform SLS on BLE for at least 8 seconds each in order to safely perform SLS to don/doff pants and pajamas -pt will also subjectively report this task has gotten easier.    Time  8    Period  Weeks    Status  New      PT LONG TERM GOAL #5   Title  Patient will ambulate at least 230' over indoor/outdoor surfaces with mod I while scanning environment and no LOB in order to demo improved community mobility.    Time  8    Period  Weeks    Status  New            Plan - 02/03/19 2057    Clinical Impression Statement  Skilled PT session today focused on compliant surface dynamic balance activities.  Worked on ankle, hip, step strategies as well as rockerboard to address hip and ankle strategies.  On rockerboard, with unsteadiness, she immediately reaches out to correct balance with UE support, versus invoking hip/ankle strategy.  Pt will continue to benefit from skilled PT to address balance and gait.    Personal Factors and Comorbidities  Time since onset of injury/illness/exacerbation;Comorbidity 3+;Past/Current Experience    Comorbidities  Ehlers-Danlos syndrome, POTS, mild cognitive impairment, fibromyalgia, hx of TIA/CVA and migraine headache.    Examination-Activity Limitations  Locomotion Level    Examination-Participation Restrictions  Community Activity    Stability/Clinical Decision Making  Stable/Uncomplicated    Rehab Potential  Good    PT Frequency  2x / week   1-2x per week depending on work schedule   PT Duration  8 weeks    PT  Treatment/Interventions  ADLs/Self Care Home Management;Aquatic Therapy;Therapeutic exercise;Therapeutic activities;Stair training;Gait training;DME Instruction;Balance training;Neuromuscular re-education;Patient/family education;Vestibular    PT Next Visit Plan  Extended STG date, as this is week 3 in POC; consider performing Sensory Organization test to further assess vestibular system use for balance; rockerboard, hip/ankle strategy work    PT Home Exercise Plan  Access Code: [email or web address] URL: [email or web address]    Consulted and Agree with Plan of Care  Patient       Patient will benefit from skilled therapeutic intervention in order to improve the following deficits and impairments:  Abnormal gait, Decreased activity tolerance, Decreased balance, Difficulty walking, Decreased strength  Visit Diagnosis: Unsteadiness on feet     Problem List Patient Active Problem List   Diagnosis Date Noted  .  Need for influenza vaccination 10/07/2018  . Estrogen deficiency 10/07/2018  . Elevated LDL cholesterol level 10/07/2018  . Anemia 10/07/2018  . Osteopenia of neck of femur 10/07/2018  . Atrophic vulva 02/06/2018  . Atrophy of vagina 02/06/2018  . Pruritus of vagina 02/06/2018  . Eustachian tube dysfunction, bilateral 11/16/2017  . Acute bronchitis 11/16/2017  . Migraine with aura 06/21/2017  . Hyponatremia 06/19/2017  . S/P mitral valve repair 12/28/2016  . Dysthymia 12/28/2016  . SVT (supraventricular tachycardia) ([place])   . Skin desquamation   . PSVT (paroxysmal supraventricular tachycardia) ([place])   . PAT (paroxysmal atrial tachycardia) ([place])   . Mitral valve prolapse   . Long term (current) use of anticoagulants   . Hypercholesterolemia   . Hypercholesteremia   . History of transient ischemic attack   . History of tachycardia-bradycardia syndrome   . History of syncope   . Fibromyalgia   . Cardiac pacemaker   . Carcinomas, basal cell   . Abnormal gait   .  Vitamin D deficiency 07/26/2016  . History of DVT (deep vein thrombosis) 05/31/2015  . Bilateral hand pain 04/06/2014  . Memory deficits 03/04/2013  . Mental status change 01/28/2013  . Constipation 04/22/2012  . Aphasia 08/03/2011  . Muscle weakness (generalized) 08/03/2011  . Abnormality of gait 08/03/2011  . Migraine without aura 08/03/2011  . Encounter for therapeutic drug monitoring 08/03/2011  . Alopecia 03/09/2011  . TMJ syndrome 10/27/2010  . CARDIAC PACEMAKER DDD MDT 03/29/2010  . SVT/ PSVT/ PAT 07/28/2008  . ATRIAL FIBRILLATION 06/11/2008  . BRADYCARDIA-TACHYCARDIA SYNDROME 05/16/2007  . Transient cerebral ischemia 05/16/2007  . EHLERS-DANLOS SYNDROME 05/16/2007  . SYNCOPE 05/16/2007  . HYPERCHOLESTEROLEMIA 05/15/2007  . Cerebrovascular disease 05/15/2007  . Hypothyroidism 01/28/2007  . Depression 01/28/2007  . Mitral valve disorder 01/28/2007  . Allergic rhinitis 01/28/2007  . FIBROMYALGIA 01/28/2007  . Dysautonomia ([place]) 01/28/2007    Cullan Launer W. 02/03/2019, 9:02 PM  Frazier Butt., PT   [facility] [address] [place], [place], [address] Phone: [phone]   Fax:  [phone]  Name: Amy Lane MRN: [id number] Date of Birth: Apr 27, 1950

## 2019-02-04 ENCOUNTER — Other Ambulatory Visit: Payer: Self-pay | Admitting: Cardiovascular Disease

## 2019-02-05 ENCOUNTER — Encounter: Payer: Self-pay | Admitting: Physical Therapy

## 2019-02-05 ENCOUNTER — Other Ambulatory Visit: Payer: Self-pay

## 2019-02-05 ENCOUNTER — Ambulatory Visit: Payer: Medicare Other | Admitting: Physical Therapy

## 2019-02-05 DIAGNOSIS — Z9181 History of falling: Secondary | ICD-10-CM | POA: Diagnosis not present

## 2019-02-05 DIAGNOSIS — R2681 Unsteadiness on feet: Secondary | ICD-10-CM | POA: Diagnosis not present

## 2019-02-05 DIAGNOSIS — R262 Difficulty in walking, not elsewhere classified: Secondary | ICD-10-CM | POA: Diagnosis not present

## 2019-02-05 DIAGNOSIS — M6281 Muscle weakness (generalized): Secondary | ICD-10-CM | POA: Diagnosis not present

## 2019-02-05 NOTE — Therapy (Signed)
[facility] [address] [place], [place], [address] Phone: [phone]   Fax:  [phone]  Physical Therapy Treatment  Patient Details  Name: Amy Lane MRN: [id number] Date of Birth: September 28, 1950 Referring Provider (PT): Kathrynn Ducking, MD   Encounter Date: 02/05/2019  PT End of Session - 02/05/19 2000    Visit Number  5    Number of Visits  17    Date for PT Re-Evaluation  04/03/19   written for 8 week POC   Authorization Type  Medicare    PT Start Time  1454   Pt arrives late   PT Stop Time  1532    PT Time Calculation (min)  38 min    Activity Tolerance  Patient tolerated treatment well    Behavior During Therapy  [facility] for tasks assessed/performed       Past Medical History:  Diagnosis Date  . Abnormal gait   . Allergic rhinitis   . Anxiety   . Atrial fibrillation ([place])   . Carcinomas, basal cell   . Cardiac pacemaker    DDD  MDT  . Cerebrovascular disease   . Clotting disorder (HCC)    DVT  . Depression   . Dysautonomia ([place])   . Ehler's-Danlos syndrome   . Fibromyalgia   . Headache(784.0)    Migraine  . Heart murmur   . Hemorrhoids   . Herpes zoster   . History of syncope   . History of tachycardia-bradycardia syndrome   . History of transient ischemic attack   . Hypercholesteremia   . Hypercholesterolemia   . Hypothyroidism   . Long term (current) use of anticoagulants   . Migraine with aura 06/21/2017   Episodes of aphasia and confusion  . Mitral valve prolapse   . PAT (paroxysmal atrial tachycardia) ([place])   . PSVT (paroxysmal supraventricular tachycardia) ([place])   . Skin desquamation    inflammative vaginitis  . Stroke ([place])   . SVT (supraventricular tachycardia) (HCC)     Past Surgical History:  Procedure Laterality Date  . ABLATION     x 2  . CAROTID ENDARTERECTOMY    . complex mitral valve repair     at the [facility]  . INSERT / REPLACE / REMOVE PACEMAKER     . LEAD REVISION    . left carotid endarterectomy  11/2003   by Dr. Delton See  . PACEMAKER INSERTION     medtronic kappa 901  . removed chalazion from left eye lid  09/2011   Dr. Satira Sark    There were no vitals filed for this visit.  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    Pertinent History  Ehlers-Danlos syndrome, POTS, mild cognitive impairment, fibromyalgia, hx of TIA/CVA and migraine headache.    Patient Stated Goals  wants to be able to keep her balance when standing to put on pants/pajamas    Currently in Pain?  No/denies    Pain Onset  More than a month ago               Sensory Organization Test:  Condition 1:  WNL all trials Condition 2:  WNL all trials Condition 3:  WNL all trials Condition 4:  Decreased trial 1 and 3; WNL trial 2 Condition 5:  Fall trial 1; WNL trials 2 and 3 Condition 6:  WNL all trials  Composite Score:  69 (WNL)  Sensory Analysis:  Somatosensory WNL Visual:  80 (decreased) Vestibular:  38 (decreased)  Strategy Analysis:  WNL except Trial 1 of condition 5 (ankle dominant)  Center of Gravity Alignment:  Left and posterior        [facility] Adult PT Treatment/Exercise - 02/05/19 0001      Self-Care   Self-Care  Other Self-Care Comments    Other Self-Care Comments   Discussed results of Sensory Organization Test, including decreased visual and decreased vestibular system use for balance.  Discussed real-life scenarios where balance may be challenged; discussed how to address with exercises in therapy sessions.          Balance Exercises - 02/05/19 1956      Balance Exercises: Standing   Rockerboard  Anterior/posterior;Lateral   see below   Other Standing Exercises  On rockerboard:  anterior/posterior work on ankle/hip strategy x 10 reps each, then standing in midline, with arm swing x 10 reps, then EO with head  turns/nods x 5 reps, EC x 10 seconds 3 reps. On rockerboard:  lateral position:  ankle/hip strategy work x 10 reps with UE support; cues for optimal technique,        PT Education - 02/05/19 1959    Education Details  Results of Sensory Organization Test    Person(s) Educated  Patient    Methods  Explanation    Comprehension  Verbalized understanding       PT Short Term Goals - 02/03/19 2101      PT SHORT TERM GOAL #1   Title  Pt will verbalize understanding of fall prevention techniques in home environment. ALL STGS DUE 02/14/19 (Date extended due to delayed start)    Time  4    Period  Weeks    Status  New      PT SHORT TERM GOAL #2   Title  Patient will be independent with initial HEP in order to build upon functional gains in therapy.    Time  4    Period  Weeks    Status  New      PT SHORT TERM GOAL #3   Title  Patient will participate in further multi-sensory balance assessment (perform mCTSIB), LTG to be written as appropriate.    Time  4    Period  Weeks    Status  New      PT SHORT TERM GOAL #4   Title  Patient will improve FGA score to at least a 16/30 in order to demo decr fall risk.    Baseline  13/30 on 01/03/19    Time  4    Period  Weeks    Status  New      PT SHORT TERM GOAL #5   Title  Patient will decrease 5x sit <> stand time to at least 24 seconds or less without UE support from standard height chair to demo improved LE strength.    Baseline  27.69 seconds    Time  4    Period  Weeks    Status  New        PT Long Term Goals - 01/03/19 1211      PT LONG TERM GOAL #1   Title  Patient will be independent with final HEP in order to build upon functional gains in therapy. ALL LTGS DUE 02/28/2019    Time  8    Period  Weeks    Status  New    Target Date  02/28/19      PT LONG TERM GOAL #2   Title  Patient will improve FGA score to at least a 21/30 in order to demo decr fall risk.    Baseline  13/30 ON 01/03/19    Time  8    Period  Weeks     Status  New      PT LONG TERM GOAL #3   Title  Patient will improve gait speed to at least 3.7 ft/sec to demo improved community mobility.    Baseline  3.21 ft/sec    Time  8    Period  Weeks    Status  New      PT LONG TERM GOAL #4   Title  Patient will be able to perform SLS on BLE for at least 8 seconds each in order to safely perform SLS to don/doff pants and pajamas -pt will also subjectively report this task has gotten easier.    Time  8    Period  Weeks    Status  New      PT LONG TERM GOAL #5   Title  Patient will ambulate at least 230' over indoor/outdoor surfaces with mod I while scanning environment and no LOB in order to demo improved community mobility.    Time  8    Period  Weeks    Status  New            Plan - 02/05/19 2001    Clinical Impression Statement  Performed Sensory Organization Test today, with overall composite balance score WNL, but vestibular and visual system use for balance is decreased.  After explanation of SOT results, worked on [place] in [place] and lateral directions with EO and closed with lessening of UE support.  Pt will contiue to benefit from skilled PT to address balance and gait.    Personal Factors and Comorbidities  Time since onset of injury/illness/exacerbation;Comorbidity 3+;Past/Current Experience    Comorbidities  Ehlers-Danlos syndrome, POTS, mild cognitive impairment, fibromyalgia, hx of TIA/CVA and migraine headache.    Examination-Activity Limitations  Locomotion Level    Examination-Participation Restrictions  Community Activity    Stability/Clinical Decision Making  Stable/Uncomplicated    Rehab Potential  Good    PT Frequency  2x / week   1-2x per week depending on work schedule   PT Duration  8 weeks    PT Treatment/Interventions  ADLs/Self Care Home Management;Aquatic Therapy;Therapeutic exercise;Therapeutic activities;Stair training;Gait training;DME Instruction;Balance training;Neuromuscular  re-education;Patient/family education;Vestibular    PT Next Visit Plan  Check STGs next week; rockerboard, hip/ankle strategy work; compliant surfaces with head motions and with EC for visual/vestibular system challenges; add to HEP as needed    PT Home Exercise Plan  Access Code: [email or web address] URL: [email or web address]    Consulted and Agree with Plan of Care  Patient       Patient will benefit from skilled therapeutic intervention in order to improve the following deficits and impairments:  Abnormal gait, Decreased activity tolerance, Decreased balance, Difficulty walking, Decreased strength  Visit Diagnosis: Unsteadiness on feet     Problem List Patient Active Problem List   Diagnosis Date Noted  . Need for influenza vaccination 10/07/2018  . Estrogen deficiency 10/07/2018  . Elevated LDL cholesterol level 10/07/2018  . Anemia 10/07/2018  . Osteopenia of neck of femur 10/07/2018  . Atrophic vulva 02/06/2018  . Atrophy of vagina 02/06/2018  . Pruritus of vagina 02/06/2018  . Eustachian tube dysfunction,  bilateral 11/16/2017  . Acute bronchitis 11/16/2017  . Migraine with aura 06/21/2017  . Hyponatremia 06/19/2017  . S/P mitral valve repair 12/28/2016  . Dysthymia 12/28/2016  . SVT (supraventricular tachycardia) ([place])   . Skin desquamation   . PSVT (paroxysmal supraventricular tachycardia) ([place])   . PAT (paroxysmal atrial tachycardia) ([place])   . Mitral valve prolapse   . Long term (current) use of anticoagulants   . Hypercholesterolemia   . Hypercholesteremia   . History of transient ischemic attack   . History of tachycardia-bradycardia syndrome   . History of syncope   . Fibromyalgia   . Cardiac pacemaker   . Carcinomas, basal cell   . Abnormal gait   . Vitamin D deficiency 07/26/2016  . History of DVT (deep vein thrombosis) 05/31/2015  . Bilateral hand pain 04/06/2014  . Memory deficits 03/04/2013  . Mental status change 01/28/2013  . Constipation  04/22/2012  . Aphasia 08/03/2011  . Muscle weakness (generalized) 08/03/2011  . Abnormality of gait 08/03/2011  . Migraine without aura 08/03/2011  . Encounter for therapeutic drug monitoring 08/03/2011  . Alopecia 03/09/2011  . TMJ syndrome 10/27/2010  . CARDIAC PACEMAKER DDD MDT 03/29/2010  . SVT/ PSVT/ PAT 07/28/2008  . ATRIAL FIBRILLATION 06/11/2008  . BRADYCARDIA-TACHYCARDIA SYNDROME 05/16/2007  . Transient cerebral ischemia 05/16/2007  . EHLERS-DANLOS SYNDROME 05/16/2007  . SYNCOPE 05/16/2007  . HYPERCHOLESTEROLEMIA 05/15/2007  . Cerebrovascular disease 05/15/2007  . Hypothyroidism 01/28/2007  . Depression 01/28/2007  . Mitral valve disorder 01/28/2007  . Allergic rhinitis 01/28/2007  . FIBROMYALGIA 01/28/2007  . Dysautonomia ([place]) 01/28/2007    Nihar Klus W. 02/05/2019, 8:05 PM Frazier Butt., PT  [facility] [address] [place], [place], [address] Phone: [phone]   Fax:  [phone]  Name: Amy Lane MRN: [id number] Date of Birth: 01/07/1951

## 2019-02-06 ENCOUNTER — Ambulatory Visit (INDEPENDENT_AMBULATORY_CARE_PROVIDER_SITE_OTHER): Payer: Medicare Other | Admitting: *Deleted

## 2019-02-06 DIAGNOSIS — I495 Sick sinus syndrome: Secondary | ICD-10-CM | POA: Diagnosis not present

## 2019-02-06 LAB — CUP PACEART REMOTE DEVICE CHECK
Battery Impedance: 1141 Ohm
Battery Remaining Longevity: 57 mo
Battery Voltage: 2.78 V
Brady Statistic AP VP Percent: 0 %
Brady Statistic AP VS Percent: 81 %
Brady Statistic AS VP Percent: 0 %
Brady Statistic AS VS Percent: 18 %
Date Time Interrogation Session: 20210114100352
Implantable Lead Implant Date: 19981106
Implantable Lead Implant Date: 20031031
Implantable Lead Location: 753859
Implantable Lead Location: 753860
Implantable Lead Model: 5076
Implantable Pulse Generator Implant Date: 20110310
Lead Channel Impedance Value: 397 Ohm
Lead Channel Impedance Value: 680 Ohm
Lead Channel Pacing Threshold Amplitude: 0.75 V
Lead Channel Pacing Threshold Amplitude: 1 V
Lead Channel Pacing Threshold Pulse Width: 0.4 ms
Lead Channel Pacing Threshold Pulse Width: 0.4 ms
Lead Channel Setting Pacing Amplitude: 2 V
Lead Channel Setting Pacing Amplitude: 2.5 V
Lead Channel Setting Pacing Pulse Width: 0.4 ms
Lead Channel Setting Sensing Sensitivity: 2.8 mV

## 2019-02-07 NOTE — Progress Notes (Signed)
PPM remote 

## 2019-02-10 ENCOUNTER — Other Ambulatory Visit: Payer: Self-pay

## 2019-02-10 ENCOUNTER — Encounter: Payer: Self-pay | Admitting: Physical Therapy

## 2019-02-10 ENCOUNTER — Ambulatory Visit: Payer: Medicare Other | Admitting: Physical Therapy

## 2019-02-10 DIAGNOSIS — R2681 Unsteadiness on feet: Secondary | ICD-10-CM | POA: Diagnosis not present

## 2019-02-10 DIAGNOSIS — Z9181 History of falling: Secondary | ICD-10-CM

## 2019-02-10 DIAGNOSIS — R262 Difficulty in walking, not elsewhere classified: Secondary | ICD-10-CM | POA: Diagnosis not present

## 2019-02-10 DIAGNOSIS — M6281 Muscle weakness (generalized): Secondary | ICD-10-CM

## 2019-02-10 NOTE — Therapy (Signed)
[facility] [address] [place], [place], [address] Phone: [phone]   Fax:  [phone]  Physical Therapy Treatment  Patient Details  Name: Amy Lane MRN: [id number] Date of Birth: 1950/09/06 Referring Provider (PT): Kathrynn Ducking, MD   Encounter Date: 02/10/2019  PT End of Session - 02/10/19 1204    Visit Number  6    Number of Visits  17    Date for PT Re-Evaluation  04/03/19   written for 8 week POC   Authorization Type  Medicare    PT Start Time  1103    PT Stop Time  1146    PT Time Calculation (min)  43 min    Equipment Utilized During Treatment  Gait belt    Activity Tolerance  Patient tolerated treatment well    Behavior During Therapy  [facility] for tasks assessed/performed       Past Medical History:  Diagnosis Date  . Abnormal gait   . Allergic rhinitis   . Anxiety   . Atrial fibrillation ([place])   . Carcinomas, basal cell   . Cardiac pacemaker    DDD  MDT  . Cerebrovascular disease   . Clotting disorder (HCC)    DVT  . Depression   . Dysautonomia ([place])   . Ehler's-Danlos syndrome   . Fibromyalgia   . Headache(784.0)    Migraine  . Heart murmur   . Hemorrhoids   . Herpes zoster   . History of syncope   . History of tachycardia-bradycardia syndrome   . History of transient ischemic attack   . Hypercholesteremia   . Hypercholesterolemia   . Hypothyroidism   . Long term (current) use of anticoagulants   . Migraine with aura 06/21/2017   Episodes of aphasia and confusion  . Mitral valve prolapse   . PAT (paroxysmal atrial tachycardia) ([place])   . PSVT (paroxysmal supraventricular tachycardia) ([place])   . Skin desquamation    inflammative vaginitis  . Stroke ([place])   . SVT (supraventricular tachycardia) (HCC)     Past Surgical History:  Procedure Laterality Date  . ABLATION     x 2  . CAROTID ENDARTERECTOMY    . complex mitral valve repair     at the [facility]  .  INSERT / REPLACE / REMOVE PACEMAKER    . LEAD REVISION    . left carotid endarterectomy  11/2003   by Dr. Delton See  . PACEMAKER INSERTION     medtronic kappa 901  . removed chalazion from left eye lid  09/2011   Dr. Satira Sark    There were no vitals filed for this visit.  Subjective Assessment - 02/10/19 1105    Subjective  No changes. States that her neck was hurting last Thursday/Friday after doing up/down head turns last session. Feeling better today.    Pertinent History  Ehlers-Danlos syndrome, POTS, mild cognitive impairment, fibromyalgia, hx of TIA/CVA and migraine headache.    Patient Stated Goals  wants to be able to keep her balance when standing to put on pants/pajamas    Currently in Pain?  No/denies    Pain Onset  More than a month ago                       [facility] Adult PT Treatment/Exercise - 02/10/19 0001      Neuro Re-ed    Neuro Re-ed Details   In corner: foam with wider BOS progressing to feet  a little less than hip width apart eyes closed, multiple reps to 20-30 seconds,feet together eyes closed 6 reps (majority holds for 10 seconds, longest was 20 seconds) -min guard for balance as pt tends to lose balance posteriorly and into the right. Wider BOS with eyes closed 3 x 5 reps head turns and eyes closed. Standing with foot in lower cabinet and other foot on foam for SLS: superiorly/lateral reaching towards cabinets 10 reps B, 3 x 5 reps head turns right and left B with min guard for balance - pt with increased difficulty with SLS on R. Standing on rocker board in // bars: 15 reps multi-directional ball toss with tech with therapist posteriorly for balance, pt able to utilize ankle strategy to maintain balance. A/P weight shifting progressing from BUE support to fingertip then no UE support approx. 20 reps with focus on hip strategy. For SLS: alternating single cone tap 1 x 10 reps B, then progressing to forward cone tap and cross body cone tap with control 10 reps  B.                PT Short Term Goals - 02/03/19 2101      PT SHORT TERM GOAL #1   Title  Pt will verbalize understanding of fall prevention techniques in home environment. ALL STGS DUE 02/14/19 (Date extended due to delayed start)    Time  4    Period  Weeks    Status  New      PT SHORT TERM GOAL #2   Title  Patient will be independent with initial HEP in order to build upon functional gains in therapy.    Time  4    Period  Weeks    Status  New      PT SHORT TERM GOAL #3   Title  Patient will participate in further multi-sensory balance assessment (perform mCTSIB), LTG to be written as appropriate.    Time  4    Period  Weeks    Status  New      PT SHORT TERM GOAL #4   Title  Patient will improve FGA score to at least a 16/30 in order to demo decr fall risk.    Baseline  13/30 on 01/03/19    Time  4    Period  Weeks    Status  New      PT SHORT TERM GOAL #5   Title  Patient will decrease 5x sit <> stand time to at least 24 seconds or less without UE support from standard height chair to demo improved LE strength.    Baseline  27.69 seconds    Time  4    Period  Weeks    Status  New        PT Long Term Goals - 01/03/19 1211      PT LONG TERM GOAL #1   Title  Patient will be independent with final HEP in order to build upon functional gains in therapy. ALL LTGS DUE 02/28/2019    Time  8    Period  Weeks    Status  New    Target Date  02/28/19      PT LONG TERM GOAL #2   Title  Patient will improve FGA score to at least a 21/30 in order to demo decr fall risk.    Baseline  13/30 ON 01/03/19    Time  8    Period  Weeks    Status  New      PT LONG TERM GOAL #3   Title  Patient will improve gait speed to at least 3.7 ft/sec to demo improved community mobility.    Baseline  3.21 ft/sec    Time  8    Period  Weeks    Status  New      PT LONG TERM GOAL #4   Title  Patient will be able to perform SLS on BLE for at least 8 seconds each in order to safely  perform SLS to don/doff pants and pajamas -pt will also subjectively report this task has gotten easier.    Time  8    Period  Weeks    Status  New      PT LONG TERM GOAL #5   Title  Patient will ambulate at least 230' over indoor/outdoor surfaces with mod I while scanning environment and no LOB in order to demo improved community mobility.    Time  8    Period  Weeks    Status  New            Plan - 02/10/19 1219    Clinical Impression Statement  Skilled PT session focused on ankle/hip balance strategies and balance with focus on vestibular system.Pt with increased difficulty with feet together eyes closed on foam. Able to progress SLS today - pt able to demo more control when performing cone taps B. Pt will continue to benefit from skilled PT to progress towards LTGs.    Personal Factors and Comorbidities  Time since onset of injury/illness/exacerbation;Comorbidity 3+;Past/Current Experience    Comorbidities  Ehlers-Danlos syndrome, POTS, mild cognitive impairment, fibromyalgia, hx of TIA/CVA and migraine headache.    Examination-Activity Limitations  Locomotion Level    Examination-Participation Restrictions  Community Activity    Stability/Clinical Decision Making  Stable/Uncomplicated    Rehab Potential  Good    PT Frequency  2x / week   1-2x per week depending on work schedule   PT Duration  8 weeks    PT Treatment/Interventions  ADLs/Self Care Home Management;Aquatic Therapy;Therapeutic exercise;Therapeutic activities;Stair training;Gait training;DME Instruction;Balance training;Neuromuscular re-education;Patient/family education;Vestibular    PT Next Visit Plan  Check STGs; rockerboard, hip/ankle strategy work; compliant surfaces with head motions and with EC for visual/vestibular system challenges, SLS, add to HEP as needed    PT Home Exercise Plan  Access Code: [email or web address] URL: [email or web address]    Consulted and Agree with Plan of Care  Patient        Patient will benefit from skilled therapeutic intervention in order to improve the following deficits and impairments:  Abnormal gait, Decreased activity tolerance, Decreased balance, Difficulty walking, Decreased strength  Visit Diagnosis: Unsteadiness on feet  History of falling  Muscle weakness (generalized)  Difficulty in walking, not elsewhere classified     Problem List Patient Active Problem List   Diagnosis Date Noted  . Need for influenza vaccination 10/07/2018  . Estrogen deficiency 10/07/2018  . Elevated LDL cholesterol level 10/07/2018  . Anemia 10/07/2018  . Osteopenia of neck of femur 10/07/2018  . Atrophic vulva 02/06/2018  . Atrophy of vagina 02/06/2018  . Pruritus of vagina 02/06/2018  . Eustachian tube dysfunction, bilateral 11/16/2017  . Acute bronchitis 11/16/2017  . Migraine with aura 06/21/2017  . Hyponatremia 06/19/2017  . S/P mitral valve repair 12/28/2016  . Dysthymia 12/28/2016  . SVT (supraventricular tachycardia) ([place])   . Skin desquamation   . PSVT (paroxysmal supraventricular tachycardia) ([place])   . PAT (paroxysmal atrial tachycardia) (  [place])   . Mitral valve prolapse   . Long term (current) use of anticoagulants   . Hypercholesterolemia   . Hypercholesteremia   . History of transient ischemic attack   . History of tachycardia-bradycardia syndrome   . History of syncope   . Fibromyalgia   . Cardiac pacemaker   . Carcinomas, basal cell   . Abnormal gait   . Vitamin D deficiency 07/26/2016  . History of DVT (deep vein thrombosis) 05/31/2015  . Bilateral hand pain 04/06/2014  . Memory deficits 03/04/2013  . Mental status change 01/28/2013  . Constipation 04/22/2012  . Aphasia 08/03/2011  . Muscle weakness (generalized) 08/03/2011  . Abnormality of gait 08/03/2011  . Migraine without aura 08/03/2011  . Encounter for therapeutic drug monitoring 08/03/2011  . Alopecia 03/09/2011  . TMJ syndrome 10/27/2010  . CARDIAC PACEMAKER DDD  MDT 03/29/2010  . SVT/ PSVT/ PAT 07/28/2008  . ATRIAL FIBRILLATION 06/11/2008  . BRADYCARDIA-TACHYCARDIA SYNDROME 05/16/2007  . Transient cerebral ischemia 05/16/2007  . EHLERS-DANLOS SYNDROME 05/16/2007  . SYNCOPE 05/16/2007  . HYPERCHOLESTEROLEMIA 05/15/2007  . Cerebrovascular disease 05/15/2007  . Hypothyroidism 01/28/2007  . Depression 01/28/2007  . Mitral valve disorder 01/28/2007  . Allergic rhinitis 01/28/2007  . FIBROMYALGIA 01/28/2007  . Dysautonomia ([place]) 01/28/2007    Arliss Journey, PT,DPT  02/10/2019, 12:26 PM  [facility] [address] [place], [place], [address] Phone: [phone]   Fax:  [phone]  Name: Amy Lane MRN: [id number] Date of Birth: [birth year]

## 2019-02-11 ENCOUNTER — Other Ambulatory Visit: Payer: Self-pay | Admitting: Family Medicine

## 2019-02-11 ENCOUNTER — Other Ambulatory Visit: Payer: Self-pay | Admitting: Cardiovascular Disease

## 2019-02-12 NOTE — Telephone Encounter (Signed)
Pt aware that Rx sent in, appointment scheduled for virtual visit.

## 2019-02-13 ENCOUNTER — Other Ambulatory Visit: Payer: Self-pay | Admitting: Family Medicine

## 2019-02-14 ENCOUNTER — Ambulatory Visit: Payer: Medicare Other | Admitting: Physical Therapy

## 2019-02-14 ENCOUNTER — Other Ambulatory Visit: Payer: Self-pay

## 2019-02-14 DIAGNOSIS — Z9181 History of falling: Secondary | ICD-10-CM

## 2019-02-14 DIAGNOSIS — R262 Difficulty in walking, not elsewhere classified: Secondary | ICD-10-CM | POA: Diagnosis not present

## 2019-02-14 DIAGNOSIS — R2681 Unsteadiness on feet: Secondary | ICD-10-CM

## 2019-02-14 DIAGNOSIS — M6281 Muscle weakness (generalized): Secondary | ICD-10-CM

## 2019-02-14 NOTE — Therapy (Signed)
[facility] [address] [place], [place], [address] Phone: [phone]   Fax:  [phone]  Physical Therapy Treatment  Patient Details  Name: Amy Lane MRN: [id number] Date of Birth: 1951-01-03 Referring Provider (PT): Kathrynn Ducking, MD   Encounter Date: 02/14/2019  PT End of Session - 02/14/19 1405    Visit Number  7    Number of Visits  17    Date for PT Re-Evaluation  04/03/19   written for 8 week POC   Authorization Type  Medicare    PT Start Time  3664   pt arrived late   PT Stop Time  1403    PT Time Calculation (min)  35 min    Equipment Utilized During Treatment  Gait belt    Activity Tolerance  Patient tolerated treatment well    Behavior During Therapy  [facility] for tasks assessed/performed       Past Medical History:  Diagnosis Date  . Abnormal gait   . Allergic rhinitis   . Anxiety   . Atrial fibrillation ([place])   . Carcinomas, basal cell   . Cardiac pacemaker    DDD  MDT  . Cerebrovascular disease   . Clotting disorder ([facility])    DVT  . Depression   . Dysautonomia ([place])   . Ehler's-Danlos syndrome   . Fibromyalgia   . Headache(784.0)    Migraine  . Heart murmur   . Hemorrhoids   . Herpes zoster   . History of syncope   . History of tachycardia-bradycardia syndrome   . History of transient ischemic attack   . Hypercholesteremia   . Hypercholesterolemia   . Hypothyroidism   . Long term (current) use of anticoagulants   . Migraine with aura 06/21/2017   Episodes of aphasia and confusion  . Mitral valve prolapse   . PAT (paroxysmal atrial tachycardia) ([place])   . PSVT (paroxysmal supraventricular tachycardia) ([place])   . Skin desquamation    inflammative vaginitis  . Stroke ([place])   . SVT (supraventricular tachycardia) (HCC)     Past Surgical History:  Procedure Laterality Date  . ABLATION     x 2  . CAROTID ENDARTERECTOMY    . complex mitral valve repair     at the  [facility]  . INSERT / REPLACE / REMOVE PACEMAKER    . LEAD REVISION    . left carotid endarterectomy  11/2003   by Dr. Delton See  . PACEMAKER INSERTION     medtronic kappa 901  . removed chalazion from left eye lid  09/2011   Dr. Satira Sark    There were no vitals filed for this visit.  Subjective Assessment - 02/14/19 1328    Subjective  No changes since last visit. Reports working on HEP, but "not as often as I wish". Reports that she is having trouble getting up from the floor (when reaching for things in her bottom cabinets) when at work.    Pertinent History  Ehlers-Danlos syndrome, POTS, mild cognitive impairment, fibromyalgia, hx of TIA/CVA and migraine headache.    Patient Stated Goals  wants to be able to keep her balance when standing to put on pants/pajamas    Currently in Pain?  No/denies    Pain Onset  More than a month ago         [facility] PT Assessment - 02/14/19 1338      Functional Gait  Assessment   Gait assessed   Yes  Gait Level Surface  Walks 20 ft in less than 5.5 sec, no assistive devices, good speed, no evidence for imbalance, normal gait pattern, deviates no more than 6 in outside of the 12 in walkway width.    Change in Gait Speed  Able to smoothly change walking speed without loss of balance or gait deviation. Deviate no more than 6 in outside of the 12 in walkway width.    Gait with Horizontal Head Turns  Performs head turns smoothly with slight change in gait velocity (eg, minor disruption to smooth gait path), deviates 6-10 in outside 12 in walkway width, or uses an assistive device.    Gait with Vertical Head Turns  Performs head turns with no change in gait. Deviates no more than 6 in outside 12 in walkway width.    Gait and Pivot Turn  Pivot turns safely within 3 sec and stops quickly with no loss of balance.    Step Over Obstacle  Is able to step over 2 stacked shoe boxes taped together (9 in total height) without changing gait speed. No evidence of  imbalance.    Gait with Narrow Base of Support  Is able to ambulate for 10 steps heel to toe with no staggering.    Gait with Eyes Closed  Walks 20 ft, slow speed, abnormal gait pattern, evidence for imbalance, deviates 10-15 in outside 12 in walkway width. Requires more than 9 sec to ambulate 20 ft.    Ambulating Backwards  Walks 20 ft, uses assistive device, slower speed, mild gait deviations, deviates 6-10 in outside 12 in walkway width.    Steps  Alternating feet, must use rail.    Total Score  25    FGA comment:  25/30                   [facility] Adult PT Treatment/Exercise - 02/14/19 1338      Transfers   Five time sit to stand comments   18.4 seconds   from standard height chair; no UE support     Neuro Re-ed    Neuro Re-ed Details   Chair in front for safety: static stance on incline (toes up and down) + EC + shoulder width BOS + head turns for 15 second holds, 3 reps; incline (toes up and down) + EC + narrow BOS static stance for 30 seconds; incline (toes up + EC + narrow BOS + head turns) for 30 seconds, 3 reps - cueing for increased head turn speed and demonstrated increase in sway. Compliant balance beam in // bars static stance + EC + 15 seconds, 2 reps; progressed to same as above + alt shoulder flex which required intermittent min A to prevent LOB and patient self-assisting via opening eyes and BUE holds to // bars              PT Education - 02/14/19 1409    Education Details  progress towards goals    Person(s) Educated  Patient    Methods  Explanation    Comprehension  Verbalized understanding       PT Short Term Goals - 02/14/19 1335      PT SHORT TERM GOAL #1   Title  Pt will verbalize understanding of fall prevention techniques in home environment. ALL STGS DUE 02/14/19 (Date extended due to delayed start)    Time  4    Period  Weeks    Status  Achieved      PT SHORT  TERM GOAL #2   Title  Patient will be independent with initial HEP in order to  build upon functional gains in therapy.    Time  4    Period  Weeks    Status  Achieved      PT SHORT TERM GOAL #3   Title  Patient will participate in further multi-sensory balance assessment (perform mCTSIB), LTG to be written as appropriate.    Time  4    Period  Weeks    Status  Achieved      PT SHORT TERM GOAL #4   Title  Patient will improve FGA score to at least a 16/30 in order to demo decr fall risk.    Baseline  25/30 on 02/14/19    Time  4    Period  Weeks    Status  Achieved      PT SHORT TERM GOAL #5   Title  Patient will decrease 5x sit <> stand time to at least 24 seconds or less without UE support from standard height chair to demo improved LE strength.    Baseline  18.4 seconds    Time  4    Period  Weeks    Status  Achieved        PT Long Term Goals - 02/14/19 1415      PT LONG TERM GOAL #1   Title  Patient will be independent with final HEP in order to build upon functional gains in therapy. ALL LTGS DUE 02/28/2019    Time  8    Period  Weeks    Status  New      PT LONG TERM GOAL #2   Title  Patient will improve FGA score to at least a 28/30 in order to demo decr fall risk.    Baseline  25/30 on 02/14/19    Time  8    Period  Weeks    Status  Revised      PT LONG TERM GOAL #3   Title  Patient will improve gait speed to at least 3.7 ft/sec to demo improved community mobility.    Baseline  3.21 ft/sec    Time  8    Period  Weeks    Status  New      PT LONG TERM GOAL #4   Title  Patient will be able to perform SLS on BLE for at least 8 seconds each in order to safely perform SLS to don/doff pants and pajamas -pt will also subjectively report this task has gotten easier.    Time  8    Period  Weeks    Status  New      PT LONG TERM GOAL #5   Title  Patient will ambulate at least 230' over indoor/outdoor surfaces with mod I while scanning environment and no LOB in order to demo improved community mobility.    Time  8    Period  Weeks    Status  New             Plan - 02/14/19 1416    Clinical Impression Statement  Focus of today's skilled session focused on assessing pt's STGs and ankle strategy with eyes closed for balance. Pt has met 5 out of 5 STGs. Pt has improved FGA score to 25/30 (was previously 13/30) - indicating a lower fall risk. Pt improved her 5x sit <> stand time to 18.4 seconds (previously approx. 28 seconds). LTGs revised as appropriate. Pt  requiring min guard/min A for balance activities today with eyes closed. Pt is progressing well - will continue to progress towards LTGs.    Personal Factors and Comorbidities  Time since onset of injury/illness/exacerbation;Comorbidity 3+;Past/Current Experience    Comorbidities  Ehlers-Danlos syndrome, POTS, mild cognitive impairment, fibromyalgia, hx of TIA/CVA and migraine headache.    Examination-Activity Limitations  Locomotion Level    Examination-Participation Restrictions  Community Activity    Stability/Clinical Decision Making  Stable/Uncomplicated    Rehab Potential  Good    PT Frequency  2x / week   1-2x per week depending on work schedule   PT Duration  8 weeks    PT Treatment/Interventions  ADLs/Self Care Home Management;Aquatic Therapy;Therapeutic exercise;Therapeutic activities;Stair training;Gait training;DME Instruction;Balance training;Neuromuscular re-education;Patient/family education;Vestibular    PT Next Visit Plan  review and update HEP (increase difficulty with eyes closed/head turns as appropriate) rockerboard, problem solve pt getting off the floor at work (using UE to help push to stand from half kneel position), hip/ankle strategy work; compliant surfaces with head motions and with EC for visual/vestibular system challenges, SLS.    PT Home Exercise Plan  Access Code: [email or web address] URL: [email or web address]    Consulted and Agree with Plan of Care  Patient       Patient will benefit from skilled therapeutic intervention in order to improve the  following deficits and impairments:  Abnormal gait, Decreased activity tolerance, Decreased balance, Difficulty walking, Decreased strength  Visit Diagnosis: Unsteadiness on feet  History of falling  Muscle weakness (generalized)  Difficulty in walking, not elsewhere classified     Problem List Patient Active Problem List   Diagnosis Date Noted  . Need for influenza vaccination 10/07/2018  . Estrogen deficiency 10/07/2018  . Elevated LDL cholesterol level 10/07/2018  . Anemia 10/07/2018  . Osteopenia of neck of femur 10/07/2018  . Atrophic vulva 02/06/2018  . Atrophy of vagina 02/06/2018  . Pruritus of vagina 02/06/2018  . Eustachian tube dysfunction, bilateral 11/16/2017  . Acute bronchitis 11/16/2017  . Migraine with aura 06/21/2017  . Hyponatremia 06/19/2017  . S/P mitral valve repair 12/28/2016  . Dysthymia 12/28/2016  . SVT (supraventricular tachycardia) ([place])   . Skin desquamation   . PSVT (paroxysmal supraventricular tachycardia) ([place])   . PAT (paroxysmal atrial tachycardia) ([place])   . Mitral valve prolapse   . Long term (current) use of anticoagulants   . Hypercholesterolemia   . Hypercholesteremia   . History of transient ischemic attack   . History of tachycardia-bradycardia syndrome   . History of syncope   . Fibromyalgia   . Cardiac pacemaker   . Carcinomas, basal cell   . Abnormal gait   . Vitamin D deficiency 07/26/2016  . History of DVT (deep vein thrombosis) 05/31/2015  . Bilateral hand pain 04/06/2014  . Memory deficits 03/04/2013  . Mental status change 01/28/2013  . Constipation 04/22/2012  . Aphasia 08/03/2011  . Muscle weakness (generalized) 08/03/2011  . Abnormality of gait 08/03/2011  . Migraine without aura 08/03/2011  . Encounter for therapeutic drug monitoring 08/03/2011  . Alopecia 03/09/2011  . TMJ syndrome 10/27/2010  . CARDIAC PACEMAKER DDD MDT 03/29/2010  . SVT/ PSVT/ PAT 07/28/2008  . ATRIAL FIBRILLATION 06/11/2008  .  BRADYCARDIA-TACHYCARDIA SYNDROME 05/16/2007  . Transient cerebral ischemia 05/16/2007  . EHLERS-DANLOS SYNDROME 05/16/2007  . SYNCOPE 05/16/2007  . HYPERCHOLESTEROLEMIA 05/15/2007  . Cerebrovascular disease 05/15/2007  . Hypothyroidism 01/28/2007  . Depression 01/28/2007  . Mitral valve disorder 01/28/2007  . Allergic rhinitis 01/28/2007  .  FIBROMYALGIA 01/28/2007  . Dysautonomia ([place]) 01/28/2007    Arliss Journey, PT, DPT  02/14/2019, 2:21 PM  [facility] [address] [place], [place], [address] Phone: [phone]   Fax:  [phone]  Name: Amy Lane MRN: [id number] Date of Birth: 01/10/1951

## 2019-02-17 ENCOUNTER — Ambulatory Visit: Payer: Medicare Other | Admitting: Physical Therapy

## 2019-02-17 ENCOUNTER — Other Ambulatory Visit: Payer: Self-pay

## 2019-02-17 ENCOUNTER — Encounter: Payer: Self-pay | Admitting: Family Medicine

## 2019-02-17 ENCOUNTER — Telehealth (INDEPENDENT_AMBULATORY_CARE_PROVIDER_SITE_OTHER): Payer: Medicare Other | Admitting: Family Medicine

## 2019-02-17 ENCOUNTER — Encounter: Payer: Self-pay | Admitting: Physical Therapy

## 2019-02-17 VITALS — Ht 71.0 in

## 2019-02-17 DIAGNOSIS — R262 Difficulty in walking, not elsewhere classified: Secondary | ICD-10-CM | POA: Diagnosis not present

## 2019-02-17 DIAGNOSIS — M810 Age-related osteoporosis without current pathological fracture: Secondary | ICD-10-CM

## 2019-02-17 DIAGNOSIS — M6281 Muscle weakness (generalized): Secondary | ICD-10-CM | POA: Diagnosis not present

## 2019-02-17 DIAGNOSIS — R2681 Unsteadiness on feet: Secondary | ICD-10-CM

## 2019-02-17 DIAGNOSIS — Z9181 History of falling: Secondary | ICD-10-CM | POA: Diagnosis not present

## 2019-02-17 NOTE — Progress Notes (Addendum)
Established Patient Office Visit  Subjective:  Patient ID: Amy Lane, female    DOB: 09-Oct-1950  Age: 69 y.o. MRN: [id number]  CC:  Chief Complaint  Patient presents with  . Follow-up    follow up on labs    HPI Amy Lane presents for a discussion of her recent bone scan that showed bone mineral density values for the lumbar spine to be -2.4 standard deviations below the mean.  Patient is currently not taking calcium but is taking a multivitamin.  She does drink milk on a daily basis.  Significant past medical history of Ehlers-Danlos.history of vit D def.   Past Medical History:  Diagnosis Date  . Abnormal gait   . Allergic rhinitis   . Anxiety   . Atrial fibrillation ([place])   . Carcinomas, basal cell   . Cardiac pacemaker    DDD  MDT  . Cerebrovascular disease   . Clotting disorder (HCC)    DVT  . Depression   . Dysautonomia ([place])   . Ehler's-Danlos syndrome   . Fibromyalgia   . Headache(784.0)    Migraine  . Heart murmur   . Hemorrhoids   . Herpes zoster   . History of syncope   . History of tachycardia-bradycardia syndrome   . History of transient ischemic attack   . Hypercholesteremia   . Hypercholesterolemia   . Hypothyroidism   . Long term (current) use of anticoagulants   . Migraine with aura 06/21/2017   Episodes of aphasia and confusion  . Mitral valve prolapse   . PAT (paroxysmal atrial tachycardia) ([place])   . PSVT (paroxysmal supraventricular tachycardia) ([place])   . Skin desquamation    inflammative vaginitis  . Stroke ([place])   . SVT (supraventricular tachycardia) (HCC)     Past Surgical History:  Procedure Laterality Date  . ABLATION     x 2  . CAROTID ENDARTERECTOMY    . complex mitral valve repair     at the [facility]  . INSERT / REPLACE / REMOVE PACEMAKER    . LEAD REVISION    . left carotid endarterectomy  11/2003   by Dr. Delton See  . PACEMAKER INSERTION     medtronic kappa 901  . removed chalazion from left eye  lid  09/2011   Dr. Satira Sark    Family History  Problem Relation Age of Onset  . Lung cancer Mother   . Prostate cancer Father   . Alzheimer's disease Father   . Heart disease Father   . Ehlers-Danlos syndrome Father   . Mitral valve prolapse Brother   . Diabetes Sister   . Mitral valve prolapse Sister   . Leukemia Other        Nephew  . Heart disease Other        entire maternal side of family  . Cancer Other        entire maternal side of family    Social History   Socioeconomic History  . Marital status: Married    Spouse name: Fritz Pickerel  . Number of children: 0  . Years of education: S-College  . Highest education level: Not on file  Occupational History  . Occupation: retired    Fish farm manager: RETIRED  Tobacco Use  . Smoking status: Never Smoker  . Smokeless tobacco: Never Used  . Tobacco comment: smoked in her early 20's--smoked 4 cigs per year   Substance and Sexual Activity  . Alcohol use: No    Alcohol/week: 0.0  standard drinks    Comment: 2 drinks per year  . Drug use: No  . Sexual activity: Not on file  Other Topics Concern  . Not on file  Social History Narrative   Patient drinks about 2 cups of caffeine daily.   Patient is right handed.    Social Determinants of Health   Financial Resource Strain:   . Difficulty of Paying Living Expenses: Not on file  Food Insecurity:   . Worried About Charity fundraiser in the Last Year: Not on file  . Ran Out of Food in the Last Year: Not on file  Transportation Needs:   . Lack of Transportation (Medical): Not on file  . Lack of Transportation (Non-Medical): Not on file  Physical Activity:   . Days of Exercise per Week: Not on file  . Minutes of Exercise per Session: Not on file  Stress:   . Feeling of Stress : Not on file  Social Connections:   . Frequency of Communication with Friends and Family: Not on file  . Frequency of Social Gatherings with Friends and Family: Not on file  . Attends Religious Services: Not  on file  . Active Member of Clubs or Organizations: Not on file  . Attends Archivist Meetings: Not on file  . Marital Status: Not on file  Intimate Partner Violence:   . Fear of Current or Ex-Partner: Not on file  . Emotionally Abused: Not on file  . Physically Abused: Not on file  . Sexually Abused: Not on file    Outpatient Medications Prior to Visit  Medication Sig Dispense Refill  . acebutolol (SECTRAL) 200 MG capsule Take 1 capsule (200 mg total) by mouth at bedtime. Please keep upcoming appt in February with Dr. Johnsie Cancel before anymore refills. Thank you 90 capsule 0  . Azelastine-Fluticasone 137-50 MCG/ACT SUSP Place 1 spray into the nose as directed. 1 Bottle 3  . citalopram (CELEXA) 20 MG tablet TAKE 1 TABLET BY MOUTH EVERY DAY 90 tablet 0  . donepezil (ARICEPT) 5 MG tablet TAKE 1 TABLET BY MOUTH EVERYDAY AT BEDTIME 90 tablet 3  . ELIQUIS 5 MG TABS tablet TAKE 1 TABLET BY MOUTH TWICE A DAY 180 tablet 1  . ezetimibe (ZETIA) 10 MG tablet Take 1 tablet (10 mg total) by mouth daily. Please keep upcoming appt in February with Dr. Johnsie Cancel before anymore refills. Thank you 30 tablet 1  . levothyroxine (SYNTHROID) 88 MCG tablet TAKE 1 TABLET (88 MCG TOTAL) BY MOUTH DAILY BEFORE BREAKFAST. 90 tablet 2  . LORazepam (ATIVAN) 1 MG tablet TAKE 1 TABLET BY MOUTH 3 TIMES A DAY AS NEEDED FOR ANXIETY 90 tablet 0  . topiramate (TOPAMAX) 25 MG tablet Take 2 tablets (50 mg total) by mouth at bedtime. 180 tablet 3   No facility-administered medications prior to visit.    Allergies  Allergen Reactions  . Other Other (See Comments)    All QTc prolongation drugs  . Statins Other (See Comments)    Muscle aches  . Adhesive [Tape] Rash  . Latex Rash    ROS Review of Systems  Constitutional: Negative.   Respiratory: Negative.   Cardiovascular: Negative.   Gastrointestinal: Negative.   Musculoskeletal: Negative for back pain and gait problem.  Psychiatric/Behavioral: Negative.         Objective:    Physical Exam  Constitutional: She is oriented to person, place, and time. She appears well-developed and well-nourished. No distress.  HENT:  Head: Normocephalic and  atraumatic.  Right Ear: External ear normal.  Left Ear: External ear normal.  Eyes: Right eye exhibits no discharge. Left eye exhibits no discharge. No scleral icterus.  Neurological: She is alert and oriented to person, place, and time.  Skin: She is not diaphoretic.  Psychiatric: She has a normal mood and affect. Her behavior is normal.    Ht 5\' 11"  (1.803 m)   BMI 19.67 kg/m  Wt Readings from Last 3 Encounters:  11/14/18 141 lb (64 kg)  10/07/18 138 lb (62.6 kg)  04/05/18 138 lb 6 oz (62.8 kg)     There are no preventive care reminders to display for this patient.  There are no preventive care reminders to display for this patient.  Lab Results  Component Value Date   TSH 0.37 10/07/2018   Lab Results  Component Value Date   WBC 4.6 10/07/2018   HGB 13.5 10/07/2018   HCT 40.0 10/07/2018   MCV 92.4 10/07/2018   PLT 163.0 10/07/2018   Lab Results  Component Value Date   NA 135 10/07/2018   K 4.2 10/07/2018   CO2 25 10/07/2018   GLUCOSE 58 (L) 10/07/2018   BUN 14 10/07/2018   CREATININE 0.82 10/07/2018   BILITOT 0.6 06/19/2017   ALKPHOS 53 06/19/2017   AST 21 06/19/2017   ALT 10 (L) 06/19/2017   PROT 6.7 06/19/2017   ALBUMIN 3.4 (L) 06/19/2017   CALCIUM 9.6 10/07/2018   ANIONGAP 9 06/20/2017   GFR 69.72 10/07/2018   Lab Results  Component Value Date   CHOL 166 07/13/2017   Lab Results  Component Value Date   HDL 56.20 07/13/2017   Lab Results  Component Value Date   LDLCALC 93 07/13/2017   Lab Results  Component Value Date   TRIG 80.0 07/13/2017   Lab Results  Component Value Date   CHOLHDL 3 07/13/2017   Lab Results  Component Value Date   HGBA1C 5.5 06/20/2017      Assessment & Plan:   Problem List Items Addressed This Visit      Musculoskeletal  and Integument   Osteoporosis without current pathological fracture - Primary      No orders of the defined types were placed in this encounter.   Follow-up: Return scheduled visit in March..  Patient will start taking 1200 mg of calcium daily. History of Vit Def. Will recheck in March.  Will research suggested treatments for patients with her medical history.  Libby Maw, MD   Virtual Visit via Video Note  I connected with [name] on 02/17/19 at  3:30 PM EST by a video enabled telemedicine application and verified that I am speaking with the correct person using two identifiers.  Location: Patient: home alone Provider:    I discussed the limitations of evaluation and management by telemedicine and the availability of in person appointments. The patient expressed understanding and agreed to proceed.  History of Present Illness:    Observations/Objective:   Assessment and Plan:   Follow Up Instructions:    I discussed the assessment and treatment plan with the patient. The patient was provided an opportunity to ask questions and all were answered. The patient agreed with the plan and demonstrated an understanding of the instructions.   The patient was advised to call back or seek an in-person evaluation if the symptoms worsen or if the condition fails to improve as anticipated.  I provided 15 minutes of non-face-to-face time during this encounter.   Gwyndolyn Saxon  Krystal Clark, MD

## 2019-02-17 NOTE — Therapy (Signed)
[facility] [address] [place], [place], [address] Phone: [phone]   Fax:  [phone]  Physical Therapy Treatment  Patient Details  Name: Amy Lane MRN: [id number] Date of Birth: 12-Apr-1950 Referring Provider (PT): Kathrynn Ducking, MD   Encounter Date: 02/17/2019  PT End of Session - 02/17/19 1440    Visit Number  8    Number of Visits  17    Date for PT Re-Evaluation  04/03/19   written for 8 week POC   Authorization Type  Medicare    PT Start Time  1109   Pt arrives late   PT Stop Time  1149    PT Time Calculation (min)  40 min    Activity Tolerance  Patient tolerated treatment well    Behavior During Therapy  [facility] for tasks assessed/performed       Past Medical History:  Diagnosis Date  . Abnormal gait   . Allergic rhinitis   . Anxiety   . Atrial fibrillation ([place])   . Carcinomas, basal cell   . Cardiac pacemaker    DDD  MDT  . Cerebrovascular disease   . Clotting disorder (HCC)    DVT  . Depression   . Dysautonomia ([place])   . Ehler's-Danlos syndrome   . Fibromyalgia   . Headache(784.0)    Migraine  . Heart murmur   . Hemorrhoids   . Herpes zoster   . History of syncope   . History of tachycardia-bradycardia syndrome   . History of transient ischemic attack   . Hypercholesteremia   . Hypercholesterolemia   . Hypothyroidism   . Long term (current) use of anticoagulants   . Migraine with aura 06/21/2017   Episodes of aphasia and confusion  . Mitral valve prolapse   . PAT (paroxysmal atrial tachycardia) ([place])   . PSVT (paroxysmal supraventricular tachycardia) ([place])   . Skin desquamation    inflammative vaginitis  . Stroke ([place])   . SVT (supraventricular tachycardia) (HCC)     Past Surgical History:  Procedure Laterality Date  . ABLATION     x 2  . CAROTID ENDARTERECTOMY    . complex mitral valve repair     at the [facility]  . INSERT / REPLACE / REMOVE PACEMAKER     . LEAD REVISION    . left carotid endarterectomy  11/2003   by Dr. Delton See  . PACEMAKER INSERTION     medtronic kappa 901  . removed chalazion from left eye lid  09/2011   Dr. Satira Sark    There were no vitals filed for this visit.  Subjective Assessment - 02/17/19 1111    Subjective  Worked on exercises over the weekend, and had some difficulty with the one leg standing.    Pertinent History  Ehlers-Danlos syndrome, POTS, mild cognitive impairment, fibromyalgia, hx of TIA/CVA and migraine headache.    Patient Stated Goals  wants to be able to keep her balance when standing to put on pants/pajamas    Currently in Pain?  No/denies    Pain Onset  More than a month ago                       [facility] Adult PT Treatment/Exercise - 02/17/19 0001      Transfers   Comments  Practiced simulating transfers getting down to floor-simulating squatting to get paint cans from near floor.  Pt with narrowed BOS and unable to get up,  without pushing hands through her feet to stand; attempted wider BOS to squat to low position, pt still needing to push with hands on feet to stand.  Utilized garden kneeling bench to go to tall kneeling, then to 1/2 kneel to stand x 3 reps with UE support through arm rests of kneeling bench.  Pt able to use garden kneeling bench to stand modified independently.  Pt feels she would use at work; provided handout on how to obtain.      Exercises   Exercises  Knee/Hip      Knee/Hip Exercises: Standing   Forward Lunges  Right;Left;1 set;10 reps    Forward Lunges Limitations  At counter for support    Forward Step Up  Right;Left;2 sets;10 reps;Hand Hold: 1;Step Height: 6"    Forward Step Up Limitations  1st set:  step up/up, down/down; 2nd set single limb step up       Knee/Hip Exercises: Seated   Sit to Sand  1 set;10 reps;without UE support   from 18" mat surface; pt fatigued at end of set       Reviewed pt's HEP, with pt return demo understanding.   Challenged corner exercises with foam and with EC/head turns and head nods. With single limb stance, provided cues that she can hold to support of counter as needed for stability when performing at home.  Balance Exercises - 02/17/19 1120      Balance Exercises: Standing   Standing Eyes Opened  Wide (BOA);Solid surface;Foam/compliant surface;Head turns;Narrow base of support (BOS);Other reps (comment);Other (comment)   Head nods; 10 reps   Standing Eyes Closed  Wide (BOA);Foam/compliant surface;Narrow base of support (BOS);2 reps;10 secs   then feet apart, EC, head turns/nods x 10 reps   Tandem Stance  Eyes open;2 reps;10 secs    SLS  Eyes open;Solid surface;Intermittent upper extremity support;2 reps;10 secs   Cues to use hand support if needed for steadiness   Tandem Gait  Forward;4 reps;Intermittent upper extremity support        PT Education - 02/17/19 1439    Education Details  updates to HEP for leg strengthening (per pt request); information on garden kneeling bench for use to ease floor>stand transfers.    Person(s) Educated  Patient    Methods  Explanation;Demonstration;Handout    Comprehension  Verbalized understanding;Returned demonstration       PT Short Term Goals - 02/14/19 1335      PT SHORT TERM GOAL #1   Title  Pt will verbalize understanding of fall prevention techniques in home environment. ALL STGS DUE 02/14/19 (Date extended due to delayed start)    Time  4    Period  Weeks    Status  Achieved      PT SHORT TERM GOAL #2   Title  Patient will be independent with initial HEP in order to build upon functional gains in therapy.    Time  4    Period  Weeks    Status  Achieved      PT SHORT TERM GOAL #3   Title  Patient will participate in further multi-sensory balance assessment (perform mCTSIB), LTG to be written as appropriate.    Time  4    Period  Weeks    Status  Achieved      PT SHORT TERM GOAL #4   Title  Patient will improve FGA score to at least  a 16/30 in order to demo decr fall risk.    Baseline  25/30 on 02/14/19    Time  4    Period  Weeks    Status  Achieved      PT SHORT TERM GOAL #5   Title  Patient will decrease 5x sit <> stand time to at least 24 seconds or less without UE support from standard height chair to demo improved LE strength.    Baseline  18.4 seconds    Time  4    Period  Weeks    Status  Achieved        PT Long Term Goals - 02/14/19 1415      PT LONG TERM GOAL #1   Title  Patient will be independent with final HEP in order to build upon functional gains in therapy. ALL LTGS DUE 02/28/2019    Time  8    Period  Weeks    Status  New      PT LONG TERM GOAL #2   Title  Patient will improve FGA score to at least a 28/30 in order to demo decr fall risk.    Baseline  25/30 on 02/14/19    Time  8    Period  Weeks    Status  Revised      PT LONG TERM GOAL #3   Title  Patient will improve gait speed to at least 3.7 ft/sec to demo improved community mobility.    Baseline  3.21 ft/sec    Time  8    Period  Weeks    Status  New      PT LONG TERM GOAL #4   Title  Patient will be able to perform SLS on BLE for at least 8 seconds each in order to safely perform SLS to don/doff pants and pajamas -pt will also subjectively report this task has gotten easier.    Time  8    Period  Weeks    Status  New      PT LONG TERM GOAL #5   Title  Patient will ambulate at least 230' over indoor/outdoor surfaces with mod I while scanning environment and no LOB in order to demo improved community mobility.    Time  8    Period  Weeks    Status  New            Plan - 02/17/19 1440    Clinical Impression Statement  Pt performed her balance HEP in corner and at counter, with several times, PT providing challenges for EC and for compliant surfaces in corner during PT session (not added to HEP).  Pt interested in BLE strengthening this visit to help her be able to squat low to floor during work tasks, with garden  kneeling bench helpful for improved ease to get up from floor with use of UEs.  Added standing leg strengthening exercises to HEP.  Pt will continue to benefit from skilled PT to address balance, strength towards LTGs.    Personal Factors and Comorbidities  Time since onset of injury/illness/exacerbation;Comorbidity 3+;Past/Current Experience    Comorbidities  Ehlers-Danlos syndrome, POTS, mild cognitive impairment, fibromyalgia, hx of TIA/CVA and migraine headache.    Examination-Activity Limitations  Locomotion Level    Examination-Participation Restrictions  Community Activity    Stability/Clinical Decision Making  Stable/Uncomplicated    Rehab Potential  Good    PT Frequency  2x / week   1-2x per week depending on work schedule   PT Duration  8 weeks    PT Treatment/Interventions  ADLs/Self  Care Home Management;Aquatic Therapy;Therapeutic exercise;Therapeutic activities;Stair training;Gait training;DME Instruction;Balance training;Neuromuscular re-education;Patient/family education;Vestibular    PT Next Visit Plan  review and update HEP (increase difficulty with eyes closed/head turns as appropriate) rockerboard, review strength updates to HEP; hip/ankle strategy work; compliant surfaces with head motions and with EC for visual/vestibular system challenges, SLS.    PT Home Exercise Plan  Access Code: [email or web address] URL: [email or web address]    Consulted and Agree with Plan of Care  Patient       Patient will benefit from skilled therapeutic intervention in order to improve the following deficits and impairments:  Abnormal gait, Decreased activity tolerance, Decreased balance, Difficulty walking, Decreased strength  Visit Diagnosis: Unsteadiness on feet  Muscle weakness (generalized)     Problem List Patient Active Problem List   Diagnosis Date Noted  . Need for influenza vaccination 10/07/2018  . Estrogen deficiency 10/07/2018  . Elevated LDL cholesterol level 10/07/2018   . Anemia 10/07/2018  . Osteopenia of neck of femur 10/07/2018  . Atrophic vulva 02/06/2018  . Atrophy of vagina 02/06/2018  . Pruritus of vagina 02/06/2018  . Eustachian tube dysfunction, bilateral 11/16/2017  . Acute bronchitis 11/16/2017  . Migraine with aura 06/21/2017  . Hyponatremia 06/19/2017  . S/P mitral valve repair 12/28/2016  . Dysthymia 12/28/2016  . SVT (supraventricular tachycardia) ([place])   . Skin desquamation   . PSVT (paroxysmal supraventricular tachycardia) ([place])   . PAT (paroxysmal atrial tachycardia) ([place])   . Mitral valve prolapse   . Long term (current) use of anticoagulants   . Hypercholesterolemia   . Hypercholesteremia   . History of transient ischemic attack   . History of tachycardia-bradycardia syndrome   . History of syncope   . Fibromyalgia   . Cardiac pacemaker   . Carcinomas, basal cell   . Abnormal gait   . Vitamin D deficiency 07/26/2016  . History of DVT (deep vein thrombosis) 05/31/2015  . Bilateral hand pain 04/06/2014  . Memory deficits 03/04/2013  . Mental status change 01/28/2013  . Constipation 04/22/2012  . Aphasia 08/03/2011  . Muscle weakness (generalized) 08/03/2011  . Abnormality of gait 08/03/2011  . Migraine without aura 08/03/2011  . Encounter for therapeutic drug monitoring 08/03/2011  . Alopecia 03/09/2011  . TMJ syndrome 10/27/2010  . CARDIAC PACEMAKER DDD MDT 03/29/2010  . SVT/ PSVT/ PAT 07/28/2008  . ATRIAL FIBRILLATION 06/11/2008  . BRADYCARDIA-TACHYCARDIA SYNDROME 05/16/2007  . Transient cerebral ischemia 05/16/2007  . EHLERS-DANLOS SYNDROME 05/16/2007  . SYNCOPE 05/16/2007  . HYPERCHOLESTEROLEMIA 05/15/2007  . Cerebrovascular disease 05/15/2007  . Hypothyroidism 01/28/2007  . Depression 01/28/2007  . Mitral valve disorder 01/28/2007  . Allergic rhinitis 01/28/2007  . FIBROMYALGIA 01/28/2007  . Dysautonomia ([place]) 01/28/2007    Gilad Dugger W. 02/17/2019, 2:46 PM Frazier Butt., PT [facility] [address] [place], [place], [address] Phone: [phone]   Fax:  [phone]  Name: Amy Lane MRN: [id number] Date of Birth: 16-Nov-1950

## 2019-02-17 NOTE — Patient Instructions (Addendum)
Access Code: [email or web address]  URL: [email or web address]  Date: 02/17/2019  Prepared by: Mady Haagensen   Exercises Standing Near Stance in [place] with Eyes Closed - 3 sets - 10 second hold - 2x daily - 7x weekly Standing Balance with Eyes Closed on Foam - 3 sets - 10 seconds hold - 2x daily - 7x weekly Standing with Head Rotation - 10 reps - 3 sets - 2x daily - 7x weekly Wide Stance with Eyes Closed and Head Nods - 10 reps - 3 sets - 2x daily - 7x weekly Standing Tandem Balance with Counter Support - 3 sets - 10 seconds hold - 2x daily - 7x weekly Single Leg Stance with Support - 3 sets - 10 second hold - 2x daily - 7x weekly Sit to Stand - 10 reps - 2 sets - 2x daily - 7x weekly  Added to HEP 02/17/2019: Forward Step Up - 10 reps - 1-2 sets - 1x daily - 5x weekly Forward Lunge with Back Leg Straight and Counter Support - 10 reps - 1-2 sets - 1x daily - 5x weekly

## 2019-02-19 ENCOUNTER — Ambulatory Visit: Payer: Medicare Other | Admitting: Physical Therapy

## 2019-02-19 ENCOUNTER — Other Ambulatory Visit: Payer: Self-pay

## 2019-02-19 ENCOUNTER — Encounter: Payer: Self-pay | Admitting: Physical Therapy

## 2019-02-19 DIAGNOSIS — R2681 Unsteadiness on feet: Secondary | ICD-10-CM | POA: Diagnosis not present

## 2019-02-19 DIAGNOSIS — Z9181 History of falling: Secondary | ICD-10-CM

## 2019-02-19 DIAGNOSIS — M6281 Muscle weakness (generalized): Secondary | ICD-10-CM | POA: Diagnosis not present

## 2019-02-19 DIAGNOSIS — R262 Difficulty in walking, not elsewhere classified: Secondary | ICD-10-CM

## 2019-02-19 NOTE — Therapy (Signed)
[facility] [address] [place], [place], [address] Phone: [phone]   Fax:  [phone]  Physical Therapy Treatment  Patient Details  Name: Amy Lane MRN: [id number] Date of Birth: 02-18-1950 Referring Provider (PT): Kathrynn Ducking, MD   Encounter Date: 02/19/2019  PT End of Session - 02/19/19 1702    Visit Number  9    Number of Visits  17    Date for PT Re-Evaluation  04/03/19   written for 8 week POC   Authorization Type  Medicare    PT Start Time  1617    PT Stop Time  1700    PT Time Calculation (min)  43 min    Equipment Utilized During Treatment  Gait belt    Activity Tolerance  Patient tolerated treatment well    Behavior During Therapy  [facility] for tasks assessed/performed       Past Medical History:  Diagnosis Date  . Abnormal gait   . Allergic rhinitis   . Anxiety   . Atrial fibrillation ([place])   . Carcinomas, basal cell   . Cardiac pacemaker    DDD  MDT  . Cerebrovascular disease   . Clotting disorder (HCC)    DVT  . Depression   . Dysautonomia ([place])   . Ehler's-Danlos syndrome   . Fibromyalgia   . Headache(784.0)    Migraine  . Heart murmur   . Hemorrhoids   . Herpes zoster   . History of syncope   . History of tachycardia-bradycardia syndrome   . History of transient ischemic attack   . Hypercholesteremia   . Hypercholesterolemia   . Hypothyroidism   . Long term (current) use of anticoagulants   . Migraine with aura 06/21/2017   Episodes of aphasia and confusion  . Mitral valve prolapse   . PAT (paroxysmal atrial tachycardia) ([place])   . PSVT (paroxysmal supraventricular tachycardia) ([place])   . Skin desquamation    inflammative vaginitis  . Stroke ([place])   . SVT (supraventricular tachycardia) (HCC)     Past Surgical History:  Procedure Laterality Date  . ABLATION     x 2  . CAROTID ENDARTERECTOMY    . complex mitral valve repair     at the [facility]  .  INSERT / REPLACE / REMOVE PACEMAKER    . LEAD REVISION    . left carotid endarterectomy  11/2003   by Dr. Delton See  . PACEMAKER INSERTION     medtronic kappa 901  . removed chalazion from left eye lid  09/2011   Dr. Satira Sark    There were no vitals filed for this visit.  Subjective Assessment - 02/19/19 1620    Subjective  No changes, no falls. going to buy the kneeling bench from [employer] on Friday.    Pertinent History  Ehlers-Danlos syndrome, POTS, mild cognitive impairment, fibromyalgia, hx of TIA/CVA and migraine headache.    Patient Stated Goals  wants to be able to keep her balance when standing to put on pants/pajamas    Currently in Pain?  No/denies    Pain Onset  More than a month ago                       [facility] Adult PT Treatment/Exercise - 02/19/19 1707      Transfers   Sit to Stand  5: Supervision    Stand to Sit  5: Supervision    Stand to Sit Details (  indicate cue type and reason)  Verbal cues for sequencing;Verbal cues for technique;Visual cues/gestures for sequencing    Stand to Sit Details  Sit to stands from elevated mat table standing on red beam for compliant surface 2 x 10 reps, cues for forward weight shift and eccentric control when lowering       High Level Balance   High Level Balance Comments  Standing next to counter top:  stepping over 4 obstacles (1 smaller hurdle, 1 larger hurdle, 2 foam beams at highest height) - focusing on reciprocal stepping and increased hip/knee flexion to clear LE, down and back 3 reps, adding cognitive challenge (naming foods starting with the letter A and going in alphabetical order) with pt with incr difficulty and needed intermittent UE support on counter and min guard for balance . On blue mat slow marching for SLS down and back 2 reps, added cognitive challenge - asked about naming animals, counting backwards. , down and back 4 reps. Standing with single LE in bottom shelf of cabinet, while other LE standing on blue  mat: 2 x 5 reps head turns R and L.       Knee/Hip Exercises: Standing   Forward Lunges  Right;Left;1 set;10 reps    Forward Lunges Limitations  reviewed HEP, cues for technique     Forward Step Up  Right;Left;10 reps;Hand Hold: 1;Step Height: 6";1 set          Balance Exercises - 02/19/19 1708      Balance Exercises: Standing   Rockerboard  EC   8 reps - 5-10 seconds each with min guard/min A for balance   Other Standing Exercises Comments  standing on rockerboard: holding a ball in D1 diagonal pattern while looking at ball for diagonal head motions x5 reps B, min guard/min A for balance, with pt with 2 instances almost losing balance posteriorly.         PT Education - 02/19/19 1701    Education Details  continue HEP    Person(s) Educated  Patient    Methods  Explanation    Comprehension  Verbalized understanding;Returned demonstration       PT Short Term Goals - 02/14/19 1335      PT SHORT TERM GOAL #1   Title  Pt will verbalize understanding of fall prevention techniques in home environment. ALL STGS DUE 02/14/19 (Date extended due to delayed start)    Time  4    Period  Weeks    Status  Achieved      PT SHORT TERM GOAL #2   Title  Patient will be independent with initial HEP in order to build upon functional gains in therapy.    Time  4    Period  Weeks    Status  Achieved      PT SHORT TERM GOAL #3   Title  Patient will participate in further multi-sensory balance assessment (perform mCTSIB), LTG to be written as appropriate.    Time  4    Period  Weeks    Status  Achieved      PT SHORT TERM GOAL #4   Title  Patient will improve FGA score to at least a 16/30 in order to demo decr fall risk.    Baseline  25/30 on 02/14/19    Time  4    Period  Weeks    Status  Achieved      PT SHORT TERM GOAL #5   Title  Patient will decrease 5x  sit <> stand time to at least 24 seconds or less without UE support from standard height chair to demo improved LE strength.     Baseline  18.4 seconds    Time  4    Period  Weeks    Status  Achieved        PT Long Term Goals - 02/14/19 1415      PT LONG TERM GOAL #1   Title  Patient will be independent with final HEP in order to build upon functional gains in therapy. ALL LTGS DUE 02/28/2019    Time  8    Period  Weeks    Status  New      PT LONG TERM GOAL #2   Title  Patient will improve FGA score to at least a 28/30 in order to demo decr fall risk.    Baseline  25/30 on 02/14/19    Time  8    Period  Weeks    Status  Revised      PT LONG TERM GOAL #3   Title  Patient will improve gait speed to at least 3.7 ft/sec to demo improved community mobility.    Baseline  3.21 ft/sec    Time  8    Period  Weeks    Status  New      PT LONG TERM GOAL #4   Title  Patient will be able to perform SLS on BLE for at least 8 seconds each in order to safely perform SLS to don/doff pants and pajamas -pt will also subjectively report this task has gotten easier.    Time  8    Period  Weeks    Status  New      PT LONG TERM GOAL #5   Title  Patient will ambulate at least 230' over indoor/outdoor surfaces with mod I while scanning environment and no LOB in order to demo improved community mobility.    Time  8    Period  Weeks    Status  New            Plan - 02/19/19 1703    Clinical Impression Statement  Focus of today's skilled session was LE strengthening, and balance strategies with focus on SLS. Pt with increased difficulty with SLS activities and clearing obstacles when adding a cognitive challenge. SLS on LLE continues to be the most challenging. Pt requiring min A today for rocker board balance when performing with eyes closed and with eyes open holding ball with D1 diagonal pattern - pt with tendency to lose balance posteriorly. Will continue to progress towards LTGs.    Personal Factors and Comorbidities  Time since onset of injury/illness/exacerbation;Comorbidity 3+;Past/Current Experience     Comorbidities  Ehlers-Danlos syndrome, POTS, mild cognitive impairment, fibromyalgia, hx of TIA/CVA and migraine headache.    Examination-Activity Limitations  Locomotion Level    Examination-Participation Restrictions  Community Activity    Stability/Clinical Decision Making  Stable/Uncomplicated    Rehab Potential  Good    PT Frequency  2x / week   1-2x per week depending on work schedule   PT Duration  8 weeks    PT Treatment/Interventions  ADLs/Self Care Home Management;Aquatic Therapy;Therapeutic exercise;Therapeutic activities;Stair training;Gait training;DME Instruction;Balance training;Neuromuscular re-education;Patient/family education;Vestibular    PT Next Visit Plan  dynamic gait/high level balance with dual tasking/cognitive challenge, hip/ankle strategy work (on foam/rockerboard); compliant surfaces with head motions and with EC for visual/vestibular system challenges, SLS.    PT Home Exercise Plan  Access Code: [email or web address] URL: [email or web address]    Consulted and Agree with Plan of Care  Patient       Patient will benefit from skilled therapeutic intervention in order to improve the following deficits and impairments:  Abnormal gait, Decreased activity tolerance, Decreased balance, Difficulty walking, Decreased strength  Visit Diagnosis: Unsteadiness on feet  Muscle weakness (generalized)  History of falling  Difficulty in walking, not elsewhere classified     Problem List Patient Active Problem List   Diagnosis Date Noted  . Osteoporosis without current pathological fracture 02/17/2019  . Need for influenza vaccination 10/07/2018  . Estrogen deficiency 10/07/2018  . Elevated LDL cholesterol level 10/07/2018  . Anemia 10/07/2018  . Osteopenia of neck of femur 10/07/2018  . Atrophic vulva 02/06/2018  . Atrophy of vagina 02/06/2018  . Pruritus of vagina 02/06/2018  . Eustachian tube dysfunction, bilateral 11/16/2017  . Acute bronchitis 11/16/2017   . Migraine with aura 06/21/2017  . Hyponatremia 06/19/2017  . S/P mitral valve repair 12/28/2016  . Dysthymia 12/28/2016  . SVT (supraventricular tachycardia) ([place])   . Skin desquamation   . PSVT (paroxysmal supraventricular tachycardia) ([place])   . PAT (paroxysmal atrial tachycardia) ([place])   . Mitral valve prolapse   . Long term (current) use of anticoagulants   . Hypercholesterolemia   . Hypercholesteremia   . History of transient ischemic attack   . History of tachycardia-bradycardia syndrome   . History of syncope   . Fibromyalgia   . Cardiac pacemaker   . Carcinomas, basal cell   . Abnormal gait   . Vitamin D deficiency 07/26/2016  . History of DVT (deep vein thrombosis) 05/31/2015  . Bilateral hand pain 04/06/2014  . Memory deficits 03/04/2013  . Mental status change 01/28/2013  . Constipation 04/22/2012  . Aphasia 08/03/2011  . Muscle weakness (generalized) 08/03/2011  . Abnormality of gait 08/03/2011  . Migraine without aura 08/03/2011  . Encounter for therapeutic drug monitoring 08/03/2011  . Alopecia 03/09/2011  . TMJ syndrome 10/27/2010  . CARDIAC PACEMAKER DDD MDT 03/29/2010  . SVT/ PSVT/ PAT 07/28/2008  . ATRIAL FIBRILLATION 06/11/2008  . BRADYCARDIA-TACHYCARDIA SYNDROME 05/16/2007  . Transient cerebral ischemia 05/16/2007  . EHLERS-DANLOS SYNDROME 05/16/2007  . SYNCOPE 05/16/2007  . HYPERCHOLESTEROLEMIA 05/15/2007  . Cerebrovascular disease 05/15/2007  . Hypothyroidism 01/28/2007  . Depression 01/28/2007  . Mitral valve disorder 01/28/2007  . Allergic rhinitis 01/28/2007  . FIBROMYALGIA 01/28/2007  . Dysautonomia ([place]) 01/28/2007    Arliss Journey, PT, DPT  02/19/2019, 5:15 PM  [facility] [address] [place], [place], [address] Phone: [phone]   Fax:  [phone]  Name: Amy Lane MRN: [id number] Date of Birth: [birth year]

## 2019-02-20 ENCOUNTER — Ambulatory Visit: Payer: Medicare Other

## 2019-02-24 ENCOUNTER — Ambulatory Visit: Payer: Medicare Other | Admitting: Physical Therapy

## 2019-02-28 ENCOUNTER — Ambulatory Visit: Payer: Medicare Other | Attending: Internal Medicine

## 2019-02-28 DIAGNOSIS — Z23 Encounter for immunization: Secondary | ICD-10-CM

## 2019-02-28 NOTE — Progress Notes (Signed)
   Covid-19 Vaccination Clinic  Name:  Amy Lane    MRN: [id number] DOB: 12-17-1950  02/28/2019  [name] was observed post Covid-19 immunization for 15 minutes without incidence. She was provided with Vaccine Information Sheet and instruction to access the V-Safe system.   [name] was instructed to call 911 with any severe reactions post vaccine: Marland Kitchen Difficulty breathing  . Swelling of your face and throat  . A fast heartbeat  . A bad rash all over your body  . Dizziness and weakness    Immunizations Administered    Name Date Dose VIS Date Route   Pfizer COVID-19 Vaccine 02/28/2019  4:37 PM 0.3 mL 01/03/2019 Intramuscular   Manufacturer: [place]   Lot: [id number]   [place]: [id number]

## 2019-03-03 ENCOUNTER — Ambulatory Visit: Payer: Medicare Other | Admitting: Physical Therapy

## 2019-03-03 NOTE — Progress Notes (Signed)
Patient ID: Amy Lane, female   DOB: [birth year], 69 y.o.   MRN: [id number]     69 y.o. female history of bradycardia post pacer  Sees Dr Caryl Comes  status post generator replacement and lead repair March 2011.   She also has a history of Ehlers-Danlos syndrome in the context of a marfanoid habitus, mitral valve prolapse status post mitral valve repair at the [facility] clinic with prior TIA on chronic anticoagulation  as well as syncope due to diagnosis of long QT syndrome. 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 Of MS with LE weakness Unable to do MRI due to pacer Seen by Dr Jannifer Franklin Left visual field cut but  Carotids normal.   May in hospital with stroke/migraine like symptoms Confused and lost speech for 10 hours CT negative Rx migraine meds and to f/u with neurology Jannifer Franklin   Husband battling prostate cancer Increasingly less willing to travel with her   PPM normal function per EP with no PAF on device interrogation  Wanted to go to [place] in July but won't with new COVID starins    ROS: Denies fever, malais, weight loss, blurry vision, decreased visual acuity, cough, sputum, SOB, hemoptysis, pleuritic pain, palpitaitons, heartburn, abdominal pain, melena, lower extremity edema, claudication, or rash.  All other systems reviewed and negative  General: BP 118/70   Pulse 72   Ht 5\' 11"  (1.803 m)   Wt 144 lb 12.8 oz (65.7 kg)   SpO2 98%   BMI 20.20 kg/m  Affect appropriate Thin frail female  HEENT: post left CEA  Neck supple with no adenopathy JVP normal no bruits no thyromegaly Lungs clear with no wheezing and good diaphragmatic motion Heart:  S1/S2  no murmur, no rub, gallop or click PMI normal  Pacer under left clavicle post mini thoracotomy for MVR  Abdomen: benighn, BS positve, no tenderness, no AAA no bruit.  No HSM or HJR Distal pulses intact with no bruits No edema Neuro non-focal Skin warm and dry No muscular weakness     Current Outpatient Medications  Medication Sig Dispense Refill  . acebutolol (SECTRAL) 200 MG capsule Take 1 capsule (200 mg total) by mouth at bedtime. Please keep upcoming appt in February with Dr. Johnsie Cancel before anymore refills. Thank you 90 capsule 0  . Azelastine-Fluticasone 137-50 MCG/ACT SUSP Place 1 spray into the nose as directed. 1 Bottle 3  . citalopram (CELEXA) 20 MG tablet TAKE 1 TABLET BY MOUTH EVERY DAY 90 tablet 0  . donepezil (ARICEPT) 5 MG tablet TAKE 1 TABLET BY MOUTH EVERYDAY AT BEDTIME 90 tablet 3  . ELIQUIS 5 MG TABS tablet TAKE 1 TABLET BY MOUTH TWICE A DAY 180 tablet 1  . ezetimibe (ZETIA) 10 MG tablet TAKE 1 TABLET BY MOUTH DAILY. PLEASE KEEP UPCOMING APPT IN FEB WITH DR. Johnsie Cancel BEFORE ANYMORE REFILL 30 tablet 0  . levothyroxine (SYNTHROID) 88 MCG tablet TAKE 1 TABLET (88 MCG TOTAL) BY MOUTH DAILY BEFORE BREAKFAST. 90 tablet 2  . LORazepam (ATIVAN) 1 MG tablet TAKE 1 TABLET BY MOUTH 3 TIMES A DAY AS NEEDED FOR ANXIETY 90 tablet 0  . topiramate (TOPAMAX) 25  MG tablet Take 2 tablets (50 mg total) by mouth at bedtime. 180 tablet 3   No current facility-administered medications for this visit.    Allergies  Other, Statins, Adhesive [tape], and Latex  Electrocardiogram:  06/20/17  A pacing RBBB 03/12/19 SR rate 72 RBBB PAC   Assessment and Plan  Pacer:  Reviewed Dr Aquilla Hacker last note normal function normal remote transmission  ? When time Can it be changed out and be MRI compatible   POTS:  Improved with no postural symptoms    TIA:  CEA patent by duplex 05/30/17 will update   Now on Eliquis with no bleeding issues ? Related to PAF Device with 0% PAF   MVrepair:  No murmur on exam  echo 04/11/16 repair looks great with normal EF   Thyroid:  Synthroid dose needs to be decreased f/u with primary  Lab Results  Component Value Date   TSH 0.37 10/07/2018   Chol:   Lab Results  Component Value Date   LDLCALC 93 07/13/2017   Neuro:  Continue aricept for cognitive dysfunction F//U DR Jannifer Franklin Migraine with TIA like symptoms now on Topamax   Ortho:  Patellar degeneration post cortisone injection f/u [facility]iofre would not encourage surgery unless Pain gets worse   F/U with me in a year    [employer]

## 2019-03-04 ENCOUNTER — Other Ambulatory Visit: Payer: Self-pay | Admitting: Cardiovascular Disease

## 2019-03-10 ENCOUNTER — Encounter: Payer: Self-pay | Admitting: Physical Therapy

## 2019-03-10 ENCOUNTER — Other Ambulatory Visit: Payer: Self-pay

## 2019-03-10 ENCOUNTER — Ambulatory Visit: Payer: Medicare Other | Attending: Neurology | Admitting: Physical Therapy

## 2019-03-10 DIAGNOSIS — R262 Difficulty in walking, not elsewhere classified: Secondary | ICD-10-CM | POA: Diagnosis not present

## 2019-03-10 DIAGNOSIS — R2681 Unsteadiness on feet: Secondary | ICD-10-CM | POA: Diagnosis not present

## 2019-03-10 NOTE — Patient Instructions (Addendum)
Access Code: [email or web address] URL: [email or web address] Date: 03/10/2019 Prepared by: Mady Haagensen  Program Notes Recommend doing all corner exercises standing on foam/pillow/towel surface (03/10/2019)  Exercises Standing Near Stance in [place] with Eyes Closed - 3 sets - 10 second hold - 2x daily - 7x weekly Standing Balance with Eyes Closed on Foam - 3 sets - 10 seconds hold - 2x daily - 7x weekly Standing with Head Rotation - 10 reps - 3 sets - 2x daily - 7x weekly Wide Stance with Eyes Closed and Head Nods - 10 reps - 3 sets - 2x daily - 7x weekly Standing Tandem Balance with Counter Support - 3 sets - 10 seconds hold - 2x daily - 7x weekly Single Leg Stance with Support - 3 sets - 10 second hold - 2x daily - 7x weekly Sit to Stand - 10 reps - 2 sets - 2x daily - 7x weekly Forward Step Up - 10 reps - 1-2 sets - 1x daily - 5x weekly Forward Lunge with Back Leg Straight and Counter Support - 10 reps - 1-2 sets - 1x daily - 5x weekly

## 2019-03-10 NOTE — Therapy (Signed)
[facility] [address] [place], [place], [address] Phone: [phone]   Fax:  [phone]  Physical Therapy Treatment/10th Visit Progress Note  Patient Details  Name: Amy Lane MRN: [id number] Date of Birth: 16-Jul-1950 Referring Provider (PT): Kathrynn Ducking, MD   Encounter Date: 03/10/2019  PT End of Session - 03/10/19 1524    Visit Number  10    Number of Visits  17    Date for PT Re-Evaluation  04/03/19   written for 8 week POC   Authorization Type  Medicare    PT Start Time  1113   Pt arrived late   PT Stop Time  1148    PT Time Calculation (min)  35 min    Activity Tolerance  Patient tolerated treatment well    Behavior During Therapy  [facility] for tasks assessed/performed       Past Medical History:  Diagnosis Date  . Abnormal gait   . Allergic rhinitis   . Anxiety   . Atrial fibrillation ([place])   . Carcinomas, basal cell   . Cardiac pacemaker    DDD  MDT  . Cerebrovascular disease   . Clotting disorder (H[facility])    DVT  . Depression   . Dysautonomia ([place])   . Ehler's-Danlos syndrome   . Fibromyalgia   . Headache(784.0)    Migraine  . Heart murmur   . Hemorrhoids   . Herpes zoster   . History of syncope   . History of tachycardia-bradycardia syndrome   . History of transient ischemic attack   . Hypercholesteremia   . Hypercholesterolemia   . Hypothyroidism   . Long term (current) use of anticoagulants   . Migraine with aura 06/21/2017   Episodes of aphasia and confusion  . Mitral valve prolapse   . PAT (paroxysmal atrial tachycardia) ([place])   . PSVT (paroxysmal supraventricular tachycardia) ([place])   . Skin desquamation    inflammative vaginitis  . Stroke ([place])   . SVT (supraventricular tachycardia) (HCC)     Past Surgical History:  Procedure Laterality Date  . ABLATION     x 2  . CAROTID ENDARTERECTOMY    . complex mitral valve repair     at the [facility]  . INSERT /  REPLACE / REMOVE PACEMAKER    . LEAD REVISION    . left carotid endarterectomy  11/2003   by Dr. Delton See  . PACEMAKER INSERTION     medtronic kappa 901  . removed chalazion from left eye lid  09/2011   Dr. Satira Sark    There were no vitals filed for this visit.  Subjective Assessment - 03/10/19 1115    Subjective  No changes, no falls; just have had some unsteadiness.  Haven't felt great over the last few weeks.  Overall, feel that my balance is much better than when I started PT.    Pertinent History  Ehlers-Danlos syndrome, POTS, mild cognitive impairment, fibromyalgia, hx of TIA/CVA and migraine headache.    Patient Stated Goals  wants to be able to keep her balance when standing to put on pants/pajamas    Currently in Pain?  No/denies    Pain Onset  More than a month ago         [facility] PT Assessment - 03/10/19 1120      Functional Gait  Assessment   Gait assessed   Yes    Gait Level Surface  Walks 20 ft in less than 5.5  sec, no assistive devices, good speed, no evidence for imbalance, normal gait pattern, deviates no more than 6 in outside of the 12 in walkway width.    Change in Gait Speed  Able to smoothly change walking speed without loss of balance or gait deviation. Deviate no more than 6 in outside of the 12 in walkway width.    Gait with Horizontal Head Turns  Performs head turns smoothly with no change in gait. Deviates no more than 6 in outside 12 in walkway width    Gait with Vertical Head Turns  Performs head turns with no change in gait. Deviates no more than 6 in outside 12 in walkway width.    Gait and Pivot Turn  Pivot turns safely within 3 sec and stops quickly with no loss of balance.    Step Over Obstacle  Is able to step over 2 stacked shoe boxes taped together (9 in total height) without changing gait speed. No evidence of imbalance.    Gait with Narrow Base of Support  Is able to ambulate for 10 steps heel to toe with no staggering.    Gait with Eyes Closed   Cannot walk 20 ft without assistance, severe gait deviations or imbalance, deviates greater than 15 in outside 12 in walkway width or will not attempt task.    Ambulating Backwards  Walks 20 ft, no assistive devices, good speed, no evidence for imbalance, normal gait    Steps  Alternating feet, must use rail.    Total Score  26    FGA comment:  26/30                   [facility] Adult PT Treatment/Exercise - 03/10/19 1120      Ambulation/Gait   Ambulation/Gait  Yes    Ambulation/Gait Assistance  5: Supervision    Ambulation Distance (Feet)  150 Feet    Assistive device  None    Gait Pattern  Step-through pattern;Narrow base of support    Ambulation Surface  Level;Indoor    Gait velocity  8.63 sec = 3.8 ft/sec    Stairs  Yes    Stairs Assistance  6: Modified independent (Device/Increase time)    Stair Management Technique  Two rails;Alternating pattern;Forwards    Number of Stairs  4   x 4 reps   Height of Stairs  6      High Level Balance   High Level Balance Activities  Other (comment)    High Level Balance Comments  Single limb stance:  >10 seconds RLE and LLE       Review of HEP: Recommend doing all corner exercises standing on foam/pillow/towel surface (03/10/2019)  Exercises Standing Near Stance in [place] with Eyes Closed - performed x 1, 10 seconds; on solid, then foam surface Standing Balance with Eyes Closed on Foam - performed x 1, 10 seconds on solid, then on foam surface Standing with Head Rotation -performed x 10 reps on solid, then 5 reps on foam surface Wide Stance with Eyes Closed and Head Nods - 10 reps on solid, then 5 reps on foam Standing Tandem Balance with Counter Support - 3 sets - 10 seconds hold - 2x daily - 7x weekly-verbally reviewed Single Leg Stance with Support - 3 sets - 10 second hold - 2x daily - 7x weekly-verbally reviewed Sit to Stand - 10 reps - 2 sets - 2x daily - 7x weekly-verbally reviewed Forward Step Up - 10 reps -pt performs at 6"  step Forward Lunge with Back Leg Straight and Counter Support - 10 reps at parallel bars for support       PT Education - 03/10/19 1523    Education Details  Updated HEP for compliant surfaces all corner exercises (pt is only currently performing on solid surfaces)    Person(s) Educated  Patient    Methods  Explanation;Demonstration;Handout;Verbal cues    Comprehension  Verbalized understanding;Returned demonstration       PT Short Term Goals - 02/14/19 1335      PT SHORT TERM GOAL #1   Title  Pt will verbalize understanding of fall prevention techniques in home environment. ALL STGS DUE 02/14/19 (Date extended due to delayed start)    Time  4    Period  Weeks    Status  Achieved      PT SHORT TERM GOAL #2   Title  Patient will be independent with initial HEP in order to build upon functional gains in therapy.    Time  4    Period  Weeks    Status  Achieved      PT SHORT TERM GOAL #3   Title  Patient will participate in further multi-sensory balance assessment (perform mCTSIB), LTG to be written as appropriate.    Time  4    Period  Weeks    Status  Achieved      PT SHORT TERM GOAL #4   Title  Patient will improve FGA score to at least a 16/30 in order to demo decr fall risk.    Baseline  25/30 on 02/14/19    Time  4    Period  Weeks    Status  Achieved      PT SHORT TERM GOAL #5   Title  Patient will decrease 5x sit <> stand time to at least 24 seconds or less without UE support from standard height chair to demo improved LE strength.    Baseline  18.4 seconds    Time  4    Period  Weeks    Status  Achieved        PT Long Term Goals - 03/10/19 1132      PT LONG TERM GOAL #1   Title  Patient will be independent with final HEP in order to build upon functional gains in therapy. ALL LTGS DUE 02/28/2019-extended date to 2/22/201 due to 1 wk remaining in [place].    Time  8    Period  Weeks    Status  New      PT LONG TERM GOAL #2   Title  Patient will improve FGA  score to at least a 28/30 in order to demo decr fall risk.    Baseline  25/30 on 02/14/19; 26/30 03/10/2019    Time  8    Period  Weeks    Status  Not Met      PT LONG TERM GOAL #3   Title  Patient will improve gait speed to at least 3.7 ft/sec to demo improved community mobility.    Baseline  3.21 ft/sec; 3.8 ft/sec    Time  8    Period  Weeks    Status  Achieved      PT LONG TERM GOAL #4   Title  Patient will be able to perform SLS on BLE for at least 8 seconds each in order to safely perform SLS to don/doff pants and pajamas -pt will also subjectively report this task has gotten  easier.    Baseline  BLE SLS > 10 sec    Time  8    Period  Weeks    Status  Achieved      PT LONG TERM GOAL #5   Title  Patient will ambulate at least 230' over indoor/outdoor surfaces with mod I while scanning environment and no LOB in order to demo improved community mobility.    Time  8    Period  Weeks    Status  New            Plan - 03/10/19 1525    Clinical Impression Statement  10th Visit Progress Note covering dates 01/03/2020-03/10/2019:  Pt reports her balance is overall better since beginning therapy, despite missing several weeks of therapy due to not feeling well.  Objective measures:  FGA 26/30 (improved from 13/30 at eval, 25/30 at last check), gait velocity 3.8 ft/sec (improved from 3.21 ft/sec), SLS >10 seconds each leg.  Pt has met LTG 3 and 4, LTG 2 not met for FGA.  Pt is progressing towards meeting remaining LTGs, with plan for d/c next visit.    Personal Factors and Comorbidities  Time since onset of injury/illness/exacerbation;Comorbidity 3+;Past/Current Experience    Comorbidities  Ehlers-Danlos syndrome, POTS, mild cognitive impairment, fibromyalgia, hx of TIA/CVA and migraine headache.    Examination-Activity Limitations  Locomotion Level    Examination-Participation Restrictions  Community Activity    Stability/Clinical Decision Making  Stable/Uncomplicated    Rehab  Potential  Good    PT Frequency  2x / week   1-2x per week depending on work schedule   PT Duration  8 weeks    PT Treatment/Interventions  ADLs/Self Care Home Management;Aquatic Therapy;Therapeutic exercise;Therapeutic activities;Stair training;Gait training;DME Instruction;Balance training;Neuromuscular re-education;Patient/family education;Vestibular    PT Next Visit Plan  Check Sensory Organization test, remaining LTGs and plan for d/c.  LTGS extended x 1 week, as 1 week remains in POC.    PT Home Exercise Plan  Access Code: [email or web address] URL: [email or web address]    Consulted and Agree with Plan of Care  Patient       Patient will benefit from skilled therapeutic intervention in order to improve the following deficits and impairments:  Abnormal gait, Decreased activity tolerance, Decreased balance, Difficulty walking, Decreased strength  Visit Diagnosis: Unsteadiness on feet     Problem List Patient Active Problem List   Diagnosis Date Noted  . Osteoporosis without current pathological fracture 02/17/2019  . Need for influenza vaccination 10/07/2018  . Estrogen deficiency 10/07/2018  . Elevated LDL cholesterol level 10/07/2018  . Anemia 10/07/2018  . Osteopenia of neck of femur 10/07/2018  . Atrophic vulva 02/06/2018  . Atrophy of vagina 02/06/2018  . Pruritus of vagina 02/06/2018  . Eustachian tube dysfunction, bilateral 11/16/2017  . Acute bronchitis 11/16/2017  . Migraine with aura 06/21/2017  . Hyponatremia 06/19/2017  . S/P mitral valve repair 12/28/2016  . Dysthymia 12/28/2016  . SVT (supraventricular tachycardia) ([place])   . Skin desquamation   . PSVT (paroxysmal supraventricular tachycardia) ([place])   . PAT (paroxysmal atrial tachycardia) ([place])   . Mitral valve prolapse   . Long term (current) use of anticoagulants   . Hypercholesterolemia   . Hypercholesteremia   . History of transient ischemic attack   . History of tachycardia-bradycardia syndrome    . History of syncope   . Fibromyalgia   . Cardiac pacemaker   . Carcinomas, basal cell   . Abnormal gait   . Vitamin D deficiency 07/26/2016  .  History of DVT (deep vein thrombosis) 05/31/2015  . Bilateral hand pain 04/06/2014  . Memory deficits 03/04/2013  . Mental status change 01/28/2013  . Constipation 04/22/2012  . Aphasia 08/03/2011  . Muscle weakness (generalized) 08/03/2011  . Abnormality of gait 08/03/2011  . Migraine without aura 08/03/2011  . Encounter for therapeutic drug monitoring 08/03/2011  . Alopecia 03/09/2011  . TMJ syndrome 10/27/2010  . CARDIAC PACEMAKER DDD MDT 03/29/2010  . SVT/ PSVT/ PAT 07/28/2008  . ATRIAL FIBRILLATION 06/11/2008  . BRADYCARDIA-TACHYCARDIA SYNDROME 05/16/2007  . Transient cerebral ischemia 05/16/2007  . EHLERS-DANLOS SYNDROME 05/16/2007  . SYNCOPE 05/16/2007  . HYPERCHOLESTEROLEMIA 05/15/2007  . Cerebrovascular disease 05/15/2007  . Hypothyroidism 01/28/2007  . Depression 01/28/2007  . Mitral valve disorder 01/28/2007  . Allergic rhinitis 01/28/2007  . FIBROMYALGIA 01/28/2007  . Dysautonomia ([place]) 01/28/2007    Nahome Bublitz W. 03/10/2019, 3:29 PM  Frazier Butt., PT   [facility] [address] [place], [place], [address] Phone: [phone]   Fax:  [phone]  Name: Amy Lane MRN: [id number] Date of Birth: 06-Dec-1950

## 2019-03-12 ENCOUNTER — Encounter: Payer: Self-pay | Admitting: Cardiovascular Disease

## 2019-03-12 ENCOUNTER — Ambulatory Visit (INDEPENDENT_AMBULATORY_CARE_PROVIDER_SITE_OTHER): Payer: Medicare Other | Admitting: Cardiovascular Disease

## 2019-03-12 ENCOUNTER — Other Ambulatory Visit: Payer: Self-pay

## 2019-03-12 VITALS — BP 118/70 | HR 72 | Ht 71.0 in | Wt 144.8 lb

## 2019-03-12 DIAGNOSIS — Z9889 Other specified postprocedural states: Secondary | ICD-10-CM

## 2019-03-12 DIAGNOSIS — I779 Disorder of arteries and arterioles, unspecified: Secondary | ICD-10-CM

## 2019-03-12 NOTE — Patient Instructions (Addendum)
Medication Instructions:   *If you need a refill on your cardiac medications before your next appointment, please call your pharmacy*  Lab Work:  If you have labs (blood work) drawn today and your tests are completely normal, you will receive your results only by: . MyChart Message (if you have MyChart) OR . A paper copy in the mail If you have any lab test that is abnormal or we need to change your treatment, we will call you to review the results.  Follow-Up: At [facility], you and your health needs are our priority.  As part of our continuing mission to provide you with exceptional heart care, we have created designated Provider Care Teams.  These Care Teams include your primary Cardiologist (physician) and Advanced Practice Providers (APPs -  Physician Assistants and Nurse Practitioners) who all work together to provide you with the care you need, when you need it.  Your next appointment:   12 month(s)  The format for your next appointment:   In Person  Provider:   You may see Peter Nishan, MD or one of the following Advanced Practice Providers on your designated Care Team:    Lori Gerhardt, NP  Laura Ingold, NP  Jill McDaniel, NP    

## 2019-03-17 ENCOUNTER — Other Ambulatory Visit: Payer: Self-pay

## 2019-03-17 ENCOUNTER — Ambulatory Visit: Payer: Medicare Other | Admitting: Physical Therapy

## 2019-03-17 DIAGNOSIS — R262 Difficulty in walking, not elsewhere classified: Secondary | ICD-10-CM

## 2019-03-17 DIAGNOSIS — R2681 Unsteadiness on feet: Secondary | ICD-10-CM | POA: Diagnosis not present

## 2019-03-17 NOTE — Therapy (Signed)
[facility] [address] [place], [place], [address] Phone: [phone]   Fax:  [phone]  Physical Therapy Treatment  Patient Details  Name: Amy Lane MRN: [id number] Date of Birth: 08/10/1950 Referring Provider (PT): Kathrynn Ducking, MD   Encounter Date: 03/17/2019  PT End of Session - 03/17/19 1655    Visit Number  11    Number of Visits  17    Date for PT Re-Evaluation  04/03/19   written for 8 week POC   Authorization Type  Medicare    PT Start Time  1105    PT Stop Time  1137   d/c day; goals checked, finished early   PT Time Calculation (min)  32 min    Activity Tolerance  Patient tolerated treatment well    Behavior During Therapy  [facility] for tasks assessed/performed       Past Medical History:  Diagnosis Date  . Abnormal gait   . Allergic rhinitis   . Anxiety   . Atrial fibrillation ([place])   . Carcinomas, basal cell   . Cardiac pacemaker    DDD  MDT  . Cerebrovascular disease   . Clotting disorder (HCC)    DVT  . Depression   . Dysautonomia ([place])   . Ehler's-Danlos syndrome   . Fibromyalgia   . Headache(784.0)    Migraine  . Heart murmur   . Hemorrhoids   . Herpes zoster   . History of syncope   . History of tachycardia-bradycardia syndrome   . History of transient ischemic attack   . Hypercholesteremia   . Hypercholesterolemia   . Hypothyroidism   . Long term (current) use of anticoagulants   . Migraine with aura 06/21/2017   Episodes of aphasia and confusion  . Mitral valve prolapse   . PAT (paroxysmal atrial tachycardia) ([place])   . PSVT (paroxysmal supraventricular tachycardia) ([place])   . Skin desquamation    inflammative vaginitis  . Stroke ([place])   . SVT (supraventricular tachycardia) (HCC)     Past Surgical History:  Procedure Laterality Date  . ABLATION     x 2  . CAROTID ENDARTERECTOMY    . complex mitral valve repair     at the [facility]  . INSERT /  REPLACE / REMOVE PACEMAKER    . LEAD REVISION    . left carotid endarterectomy  11/2003   by Dr. Delton See  . PACEMAKER INSERTION     medtronic kappa 901  . removed chalazion from left eye lid  09/2011   Dr. Satira Sark    There were no vitals filed for this visit.  Subjective Assessment - 03/17/19 1106    Subjective  No changes, no falls.    Pertinent History  Ehlers-Danlos syndrome, POTS, mild cognitive impairment, fibromyalgia, hx of TIA/CVA and migraine headache.    Patient Stated Goals  wants to be able to keep her balance when standing to put on pants/pajamas    Currently in Pain?  No/denies    Pain Onset  More than a month ago                       [facility] Adult PT Treatment/Exercise - 03/17/19 0001      Ambulation/Gait   Ambulation/Gait  Yes    Ambulation/Gait Assistance  6: Modified independent (Device/Increase time)    Ambulation Distance (Feet)  250 Feet    Assistive device  None    Gait Pattern  Step-through pattern;Narrow base of support    Ambulation Surface  Level;Indoor       Reviewed updates to HEP, with pt independent:   Standing Near Stance in [place] with Eyes Closed  On foam-10 sec, 2 reps cues for UE support Standing Balance with Eyes Closed on Foam on foam, 10 sec Standing with Head Rotation - 10 reps - on foam Wide Stance with Eyes Closed and Head Nods - 10 reps - on foam  Sensory Organization Test Condition 1:  WNL all 3 trials Condition 2:  WNL all 3 trials Condition 3:  WNL all 3 trials Condition 4:  Decreased trial 1, WNL trials 2-3 Condition 5:  WNL all 3 trials Condition 6:  WNL all 3 trials  Composite Balance score 75 (improved from 69)  Sensory Analysis:  WNL somatosensory, Vision and vestibular systems     PT Education - 03/17/19 1654    Education Details  Results of Sensory Organization Test, plans for d/c this visit    Person(s) Educated  Patient    Methods  Explanation    Comprehension  Verbalized understanding        PT Short Term Goals - 02/14/19 1335      PT SHORT TERM GOAL #1   Title  Pt will verbalize understanding of fall prevention techniques in home environment. ALL STGS DUE 02/14/19 (Date extended due to delayed start)    Time  4    Period  Weeks    Status  Achieved      PT SHORT TERM GOAL #2   Title  Patient will be independent with initial HEP in order to build upon functional gains in therapy.    Time  4    Period  Weeks    Status  Achieved      PT SHORT TERM GOAL #3   Title  Patient will participate in further multi-sensory balance assessment (perform mCTSIB), LTG to be written as appropriate.    Time  4    Period  Weeks    Status  Achieved      PT SHORT TERM GOAL #4   Title  Patient will improve FGA score to at least a 16/30 in order to demo decr fall risk.    Baseline  25/30 on 02/14/19    Time  4    Period  Weeks    Status  Achieved      PT SHORT TERM GOAL #5   Title  Patient will decrease 5x sit <> stand time to at least 24 seconds or less without UE support from standard height chair to demo improved LE strength.    Baseline  18.4 seconds    Time  4    Period  Weeks    Status  Achieved        PT Long Term Goals - 03/17/19 1136      PT LONG TERM GOAL #1   Title  Patient will be independent with final HEP in order to build upon functional gains in therapy. ALL LTGS DUE 02/28/2019-extended date to 2/22/201 due to 1 wk remaining in [place].    Time  8    Period  Weeks    Status  Achieved      PT LONG TERM GOAL #2   Title  Patient will improve FGA score to at least a 28/30 in order to demo decr fall risk.    Baseline  25/30 on 02/14/19; 26/30 03/10/2019    Time  8  Period  Weeks    Status  Not Met      PT LONG TERM GOAL #3   Title  Patient will improve gait speed to at least 3.7 ft/sec to demo improved community mobility.    Baseline  3.21 ft/sec; 3.8 ft/sec    Time  8    Period  Weeks    Status  Achieved      PT LONG TERM GOAL #4   Title  Patient will be able  to perform SLS on BLE for at least 8 seconds each in order to safely perform SLS to don/doff pants and pajamas -pt will also subjectively report this task has gotten easier.    Baseline  BLE SLS > 10 sec    Time  8    Period  Weeks    Status  Achieved      PT LONG TERM GOAL #5   Title  Patient will ambulate at least 230' over indoor/outdoor surfaces with mod I while scanning environment and no LOB in order to demo improved community mobility.    Time  8    Period  Weeks    Status  Achieved            Plan - 03/17/19 1657    Clinical Impression Statement  Remaining goals checked this visit, with pt meeting LTG 1 and LTG 5.  Rechecked Sensory Organization test this visit, and pt demonstrates improved composite balance score and improved visual and vestibular system use for balance.  Pt has demonstrated overall improvement in balance and gait measures and is appropriate for d/c.    Personal Factors and Comorbidities  Time since onset of injury/illness/exacerbation;Comorbidity 3+;Past/Current Experience    Comorbidities  Ehlers-Danlos syndrome, POTS, mild cognitive impairment, fibromyalgia, hx of TIA/CVA and migraine headache.    Examination-Activity Limitations  Locomotion Level    Examination-Participation Restrictions  Community Activity    Stability/Clinical Decision Making  Stable/Uncomplicated    Rehab Potential  Good    PT Frequency  2x / week   1-2x per week depending on work schedule   PT Duration  8 weeks    PT Treatment/Interventions  ADLs/Self Care Home Management;Aquatic Therapy;Therapeutic exercise;Therapeutic activities;Stair training;Gait training;DME Instruction;Balance training;Neuromuscular re-education;Patient/family education;Vestibular    PT Next Visit Plan  D/C PT this visit    PT Home Exercise Plan  Access Code: [email or web address] URL: [email or web address]    Consulted and Agree with Plan of Care  Patient       Patient will benefit from skilled  therapeutic intervention in order to improve the following deficits and impairments:  Abnormal gait, Decreased activity tolerance, Decreased balance, Difficulty walking, Decreased strength  Visit Diagnosis: Unsteadiness on feet  Difficulty in walking, not elsewhere classified     Problem List Patient Active Problem List   Diagnosis Date Noted  . Osteoporosis without current pathological fracture 02/17/2019  . Need for influenza vaccination 10/07/2018  . Estrogen deficiency 10/07/2018  . Elevated LDL cholesterol level 10/07/2018  . Anemia 10/07/2018  . Osteopenia of neck of femur 10/07/2018  . Atrophic vulva 02/06/2018  . Atrophy of vagina 02/06/2018  . Pruritus of vagina 02/06/2018  . Eustachian tube dysfunction, bilateral 11/16/2017  . Acute bronchitis 11/16/2017  . Migraine with aura 06/21/2017  . Hyponatremia 06/19/2017  . S/P mitral valve repair 12/28/2016  . Dysthymia 12/28/2016  . SVT (supraventricular tachycardia) ([place])   . Skin desquamation   . PSVT (paroxysmal supraventricular tachycardia) ([place])   . PAT (paroxysmal atrial tachycardia) ([place])   .  Mitral valve prolapse   . Long term (current) use of anticoagulants   . Hypercholesterolemia   . Hypercholesteremia   . History of transient ischemic attack   . History of tachycardia-bradycardia syndrome   . History of syncope   . Fibromyalgia   . Cardiac pacemaker   . Carcinomas, basal cell   . Abnormal gait   . Vitamin D deficiency 07/26/2016  . History of DVT (deep vein thrombosis) 05/31/2015  . Bilateral hand pain 04/06/2014  . Memory deficits 03/04/2013  . Mental status change 01/28/2013  . Constipation 04/22/2012  . Aphasia 08/03/2011  . Muscle weakness (generalized) 08/03/2011  . Abnormality of gait 08/03/2011  . Migraine without aura 08/03/2011  . Encounter for therapeutic drug monitoring 08/03/2011  . Alopecia 03/09/2011  . TMJ syndrome 10/27/2010  . CARDIAC PACEMAKER DDD MDT 03/29/2010  . SVT/ PSVT/  PAT 07/28/2008  . ATRIAL FIBRILLATION 06/11/2008  . BRADYCARDIA-TACHYCARDIA SYNDROME 05/16/2007  . Transient cerebral ischemia 05/16/2007  . EHLERS-DANLOS SYNDROME 05/16/2007  . SYNCOPE 05/16/2007  . HYPERCHOLESTEROLEMIA 05/15/2007  . Cerebrovascular disease 05/15/2007  . Hypothyroidism 01/28/2007  . Depression 01/28/2007  . Mitral valve disorder 01/28/2007  . Allergic rhinitis 01/28/2007  . FIBROMYALGIA 01/28/2007  . Dysautonomia ([place]) 01/28/2007    Annita Ratliff W. 03/17/2019, 5:00 PM [place] [address] [place] [address] [place], [place], [address] Phone: [phone]   Fax:  [phone]  Name: Amy Lane MRN: [id number] Date of Birth: 24-Jul-1950   PHYSICAL THERAPY DISCHARGE SUMMARY  Visits from Start of Care: 11  Current functional level related to goals / functional outcomes: PT Long Term Goals - 03/17/19 1136      PT LONG TERM GOAL #1   Title  Patient will be independent with final HEP in order to build upon functional gains in therapy. ALL LTGS DUE 02/28/2019-extended date to 2/22/201 due to 1 wk remaining in [place].    Time  8    Period  Weeks    Status  Achieved      PT LONG TERM GOAL #2   Title  Patient will improve FGA score to at least a 28/30 in order to demo decr fall risk.    Baseline  25/30 on 02/14/19; 26/30 03/10/2019    Time  8    Period  Weeks    Status  Not Met      PT LONG TERM GOAL #3   Title  Patient will improve gait speed to at least 3.7 ft/sec to demo improved community mobility.    Baseline  3.21 ft/sec; 3.8 ft/sec    Time  8    Period  Weeks    Status  Achieved      PT LONG TERM GOAL #4   Title  Patient will be able to perform SLS on BLE for at least 8 seconds each in order to safely perform SLS to don/doff pants and pajamas -pt will also subjectively report this task has gotten easier.    Baseline  BLE SLS > 10 sec    Time  8    Period  Weeks    Status  Achieved       PT LONG TERM GOAL #5   Title  Patient will ambulate at least 230' over indoor/outdoor surfaces with mod I while scanning environment and no LOB in order to demo improved community mobility.    Time  8    Period  Weeks    Status  Achieved      Pt has met 4 of 5 LTGs.   Remaining deficits: High level balance   Education / Equipment: Educated in [employer], fall prevention  Plan: Patient agrees to discharge.  Patient goals were met. Patient is being discharged due to meeting the stated rehab goals.  ?????        Mady Haagensen, PT 03/17/19 5:12 PM Phone: [phone] Fax: [phone]

## 2019-03-18 DIAGNOSIS — H2513 Age-related nuclear cataract, bilateral: Secondary | ICD-10-CM | POA: Diagnosis not present

## 2019-03-26 ENCOUNTER — Ambulatory Visit: Payer: Medicare Other | Attending: Internal Medicine

## 2019-03-26 DIAGNOSIS — Z23 Encounter for immunization: Secondary | ICD-10-CM

## 2019-03-26 NOTE — Progress Notes (Signed)
   Covid-19 Vaccination Clinic  Name:  Amy Lane    MRN: [id number] DOB: [birth year]  03/26/2019  [name] was observed post Covid-19 immunization for 30 minutes based on pre-vaccination screening and during that time she verbalized to Clair Gulling, RN that she was experiencing some itching to left thumb which received vaccine to left deltoid at 1210. Left deltoid site assessed and no other redness, itching swelling to site or anywhere else. BP 131/58, hr 56, rr 20 and 02 sat 98% RA. Patient declined any medication for itching since she was just experiencing this to lft thumb only. Patient agreed to stay for another 30 mins for observation. At 1305, patient states itching has gone and was released with BP 125/65, hr 56, rr 18 and 02 sat 100%. Encouraged her to notify PCP of events. She was provided with Vaccine Information Sheet and instruction to access the V-Safe system.   [name] was instructed to call 911 with any severe reactions post vaccine: Marland Kitchen Difficulty breathing  . Swelling of face and throat  . A fast heartbeat  . A bad rash all over body  . Dizziness and weakness   Immunizations Administered    Name Date Dose VIS Date Route   Pfizer COVID-19 Vaccine 03/26/2019 12:10 PM 0.3 mL 01/03/2019 Intramuscular   Manufacturer: [place]   Lot: [id number]   [place]: [id number]

## 2019-03-31 ENCOUNTER — Other Ambulatory Visit: Payer: Self-pay

## 2019-03-31 MED ORDER — EZETIMIBE 10 MG PO TABS: ORAL_TABLET | ORAL | 3 refills | Status: DC

## 2019-04-02 DIAGNOSIS — R001 Bradycardia, unspecified: Secondary | ICD-10-CM | POA: Insufficient documentation

## 2019-04-02 DIAGNOSIS — G90A Postural orthostatic tachycardia syndrome (POTS): Secondary | ICD-10-CM | POA: Insufficient documentation

## 2019-04-03 ENCOUNTER — Other Ambulatory Visit: Payer: Self-pay

## 2019-04-03 ENCOUNTER — Telehealth (INDEPENDENT_AMBULATORY_CARE_PROVIDER_SITE_OTHER): Payer: Medicare Other | Admitting: Internal Medicine

## 2019-04-03 VITALS — BP 123/71 | HR 55 | Ht 70.5 in | Wt 144.0 lb

## 2019-04-03 DIAGNOSIS — R001 Bradycardia, unspecified: Secondary | ICD-10-CM

## 2019-04-03 DIAGNOSIS — G90A Postural orthostatic tachycardia syndrome (POTS): Secondary | ICD-10-CM

## 2019-04-03 DIAGNOSIS — Z950 Presence of cardiac pacemaker: Secondary | ICD-10-CM

## 2019-04-03 DIAGNOSIS — I498 Other specified cardiac arrhythmias: Secondary | ICD-10-CM | POA: Diagnosis not present

## 2019-04-03 DIAGNOSIS — I480 Paroxysmal atrial fibrillation: Secondary | ICD-10-CM

## 2019-04-03 NOTE — Progress Notes (Signed)
Electrophysiology TeleHealth Note   Due to national recommendations of social distancing due to COVID 19, an audio/video telehealth visit is felt to be most appropriate for this patient at this time.  See MyChart message from today for the patient's consent to telehealth for [facility].   Date:  04/03/2019   ID:  Amy Lane, DOB December 24, 1950, MRN [id number]  Location: patient's home  Provider location: [address] [place]  Evaluation Performed: Follow-up visit  PCP:  Libby Maw, MD  Cardiologist:    Electrophysiologist:  SK   Chief Complaint:  Atrial fibrillation   History of Present Illness:    Amy Lane is a 69 y.o. female who presents via audio/video conferencing for a telehealth visit today.  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    Past Medical History:  Diagnosis Date  . Abnormal gait   . Allergic rhinitis   . Anxiety   . Atrial fibrillation ([place])   . Carcinomas, basal cell   . Cardiac pacemaker    DDD  MDT  . Cerebrovascular disease   . Clotting disorder (HCC)    DVT  . Depression   . Dysautonomia ([place])   . Ehler's-Danlos syndrome   . Fibromyalgia   . Headache(784.0)    Migraine  . Heart murmur   . Hemorrhoids   . Herpes zoster   . History of syncope   . History of tachycardia-bradycardia syndrome   . History of transient ischemic attack   . Hypercholesteremia   . Hypercholesterolemia   . Hypothyroidism   . Long term (current) use of anticoagulants   . Migraine with aura 06/21/2017   Episodes of aphasia and confusion  . Mitral valve prolapse   . PAT (paroxysmal atrial tachycardia) ([place])   . PSVT (paroxysmal supraventricular tachycardia) ([place])   . Skin desquamation      inflammative vaginitis  . Stroke ([place])   . SVT (supraventricular tachycardia) (HCC)     Past Surgical History:  Procedure Laterality Date  . ABLATION     x 2  . CAROTID ENDARTERECTOMY    . complex mitral valve repair     at the [facility]  . INSERT / REPLACE / REMOVE PACEMAKER    . LEAD REVISION    . left carotid endarterectomy  11/2003   by Dr. Delton See  . PACEMAKER INSERTION     medtronic kappa 901  . removed chalazion from left eye lid  09/2011   Dr. Satira Sark    Current Outpatient Medications  Medication Sig Dispense Refill  . acebutolol (SECTRAL) 200 MG capsule Take 1 capsule (200 mg total) by mouth at bedtime. Please keep upcoming appt in February with Dr. Johnsie Cancel before anymore refills. Thank you 90 capsule 0  . Azelastine-Fluticasone 137-50 MCG/ACT SUSP Place 1 spray into the nose as directed. 1 Bottle 3  . citalopram (CELEXA) 20 MG tablet TAKE 1 TABLET BY MOUTH EVERY DAY 90 tablet 0  . donepezil (ARICEPT) 5 MG tablet TAKE 1 TABLET BY MOUTH EVERYDAY AT BEDTIME 90 tablet 3  . ELIQUIS 5 MG TABS tablet TAKE 1 TABLET BY MOUTH TWICE A DAY 180 tablet 1  . ezetimibe (ZETIA) 10  MG tablet TAKE 1 TABLET BY MOUTH DAILY. PLEASE KEEP UPCOMING APPT IN FEB WITH DR. Johnsie Cancel BEFORE ANYMORE REFILL 90 tablet 3  . levothyroxine (SYNTHROID) 88 MCG tablet TAKE 1 TABLET (88 MCG TOTAL) BY MOUTH DAILY BEFORE BREAKFAST. 90 tablet 2  . LORazepam (ATIVAN) 1 MG tablet TAKE 1 TABLET BY MOUTH 3 TIMES A DAY AS NEEDED FOR ANXIETY 90 tablet 0  . topiramate (TOPAMAX) 25 MG tablet Take 2 tablets (50 mg total) by mouth at bedtime. 180 tablet 3   No current facility-administered medications for this visit.    Allergies:   Other, Statins, Adhesive [tape], and Latex   Social History:  The patient  reports that she has never smoked. She has never used smokeless tobacco. She reports that she does not drink alcohol or use drugs.   Family History:  The patient's   family history includes Alzheimer's disease  in her father; Cancer in an other family member; Diabetes in her sister; Ehlers-Danlos syndrome in her father; Heart disease in her father and another family member; Leukemia in an other family member; Lung cancer in her mother; Mitral valve prolapse in her brother and sister; Prostate cancer in her father.   ROS:  Please see the history of present illness.   All other systems are personally reviewed and negative.    Exam:    Vital Signs:  Ht 5' 10.5" (1.791 m)   Wt 144 lb (65.3 kg)   BMI 20.37 kg/m     Labs/Other Tests and Data Reviewed:    Recent Labs: 10/07/2018: BUN 14; Creatinine, Ser 0.82; Hemoglobin 13.5; Platelets 163.0; Potassium 4.2; Sodium 135; TSH 0.37   Wt Readings from Last 3 Encounters:  04/03/19 144 lb (65.3 kg)  03/12/19 144 lb 12.8 oz (65.7 kg)  11/14/18 141 lb (64 kg)     Other studies personally reviewed: Additional studies/ records that were reviewed today    Device evaluation 1/21 normal device function No arrhythmia   ASSESSMENT & PLAN:    Recurrent TIA  Mitral valve repair  Possible long QT  POTS/dysautonomia  Marfanoid habitus  Atrial fibrillation-paroxysmal  Pacemaker Medtronic   No interval AFib; On Anticoagulation;  No bleeding issues   No lightheadedness  No syncope   Wanting to travel --    COVID 19 screen The patient denies symptoms of COVID 19 at this time.  The importance of social distancing was discussed today.  Follow-up:  63 M Remote As Scheduled     Current medicines are reviewed at length with the patient today.   The patient does not have concerns regarding her medicines.  The following changes were made today:  none  Labs/ tests ordered today include:   No orders of the defined types were placed in this encounter.   Future tests ( post COVID )     Patient Risk:  after full review of this patients clinical status, I feel that they are at moderate*  risk at this time.  Today, I have spent 7 minutes  with the patient with telehealth technology discussing the above.  Signed, Virl Axe, MD  04/03/2019 2:33 PM     [facility] [address] [place] [address] [phone] (office) [phone] (fax)

## 2019-04-03 NOTE — Patient Instructions (Signed)
Medication Instructions:  Your physician recommends that you continue on your current medications as directed. Please refer to the Current Medication list given to you today.  Labwork: None ordered.  Testing/Procedures: None ordered.  Follow-Up: Your physician wants you to follow-up in: 12 months with Dr Caryl Comes.  You will receive a reminder letter in the mail two months in advance. If you don't receive a letter, please call our office to schedule the follow-up appointment.  Remote monitoring is used to monitor your Pacemaker of ICD from home. This monitoring reduces the number of office visits required to check your device to one time per year. It allows [place] to keep an eye on the functioning of your device to ensure it is working properly. You are scheduled for a device check from home on 05/08/2019. You may send your transmission at any time that day. If you have a wireless device, the transmission will be sent automatically. After your physician reviews your transmission, you will receive a postcard with your next transmission date.  Any Other Special Instructions Will Be Listed Below (If Applicable).  If you need a refill on your cardiac medications before your next appointment, please call your pharmacy.

## 2019-04-07 ENCOUNTER — Ambulatory Visit: Payer: Medicare Other | Admitting: Family Medicine

## 2019-04-09 ENCOUNTER — Other Ambulatory Visit: Payer: Self-pay | Admitting: Cardiovascular Disease

## 2019-04-09 NOTE — Telephone Encounter (Signed)
Prescription refill request for Eliquis received.  Last office visit: Caryl Comes. 04/03/2019 Scr: 0.82, 10/07/2018 Age: 69 y.o. Weight: 65.3 kg   Prescription refill sent.

## 2019-04-28 DIAGNOSIS — D229 Melanocytic nevi, unspecified: Secondary | ICD-10-CM | POA: Diagnosis not present

## 2019-04-28 DIAGNOSIS — L708 Other acne: Secondary | ICD-10-CM | POA: Diagnosis not present

## 2019-04-28 DIAGNOSIS — L821 Other seborrheic keratosis: Secondary | ICD-10-CM | POA: Diagnosis not present

## 2019-04-28 DIAGNOSIS — L718 Other rosacea: Secondary | ICD-10-CM | POA: Diagnosis not present

## 2019-04-28 DIAGNOSIS — L814 Other melanin hyperpigmentation: Secondary | ICD-10-CM | POA: Diagnosis not present

## 2019-04-28 DIAGNOSIS — L819 Disorder of pigmentation, unspecified: Secondary | ICD-10-CM | POA: Diagnosis not present

## 2019-04-28 DIAGNOSIS — D1801 Hemangioma of skin and subcutaneous tissue: Secondary | ICD-10-CM | POA: Diagnosis not present

## 2019-05-08 ENCOUNTER — Ambulatory Visit (INDEPENDENT_AMBULATORY_CARE_PROVIDER_SITE_OTHER): Payer: Medicare Other | Admitting: *Deleted

## 2019-05-08 ENCOUNTER — Telehealth: Payer: Self-pay | Admitting: Family Medicine

## 2019-05-08 ENCOUNTER — Other Ambulatory Visit: Payer: Self-pay | Admitting: Cardiovascular Disease

## 2019-05-08 DIAGNOSIS — I495 Sick sinus syndrome: Secondary | ICD-10-CM | POA: Diagnosis not present

## 2019-05-08 LAB — CUP PACEART REMOTE DEVICE CHECK
Battery Impedance: 1276 Ohm
Battery Remaining Longevity: 53 mo
Battery Voltage: 2.77 V
Brady Statistic AP VP Percent: 0 %
Brady Statistic AP VS Percent: 82 %
Brady Statistic AS VP Percent: 0 %
Brady Statistic AS VS Percent: 18 %
Date Time Interrogation Session: 20210415161317
Implantable Pulse Generator Implant Date: 20110310
Lead Channel Impedance Value: 414 Ohm
Lead Channel Impedance Value: 657 Ohm
Lead Channel Pacing Threshold Amplitude: 0.75 V
Lead Channel Pacing Threshold Amplitude: 1 V
Lead Channel Pacing Threshold Pulse Width: 0.4 ms
Lead Channel Pacing Threshold Pulse Width: 0.4 ms
Lead Channel Setting Pacing Amplitude: 2 V
Lead Channel Setting Pacing Amplitude: 2.5 V
Lead Channel Setting Pacing Pulse Width: 0.4 ms
Lead Channel Setting Sensing Sensitivity: 2.8 mV

## 2019-05-08 NOTE — Telephone Encounter (Signed)
Patient is returning the call. CB is [phone].

## 2019-05-08 NOTE — Telephone Encounter (Signed)
Called patient to see if she was completely out of medication due to having a upcoming appointment for follow up. No answer LMTCB

## 2019-05-08 NOTE — Telephone Encounter (Signed)
Spoke with patient who states that she has enough pills until she comes in for her follow up visit.

## 2019-05-09 NOTE — Progress Notes (Signed)
PPM Remote  

## 2019-05-19 ENCOUNTER — Ambulatory Visit: Payer: Medicare Other | Admitting: Family Medicine

## 2019-06-07 ENCOUNTER — Other Ambulatory Visit: Payer: Self-pay | Admitting: Family Medicine

## 2019-06-07 DIAGNOSIS — E039 Hypothyroidism, unspecified: Secondary | ICD-10-CM

## 2019-08-07 ENCOUNTER — Ambulatory Visit (INDEPENDENT_AMBULATORY_CARE_PROVIDER_SITE_OTHER): Payer: Medicare Other | Admitting: *Deleted

## 2019-08-07 DIAGNOSIS — I471 Supraventricular tachycardia: Secondary | ICD-10-CM | POA: Diagnosis not present

## 2019-08-08 LAB — CUP PACEART REMOTE DEVICE CHECK
Battery Impedance: 1332 Ohm
Battery Remaining Longevity: 51 mo
Battery Voltage: 2.77 V
Brady Statistic AP VP Percent: 0 %
Brady Statistic AP VS Percent: 82 %
Brady Statistic AS VP Percent: 0 %
Brady Statistic AS VS Percent: 18 %
Date Time Interrogation Session: 20210715181617
Implantable Pulse Generator Implant Date: 20110310
Lead Channel Impedance Value: 392 Ohm
Lead Channel Impedance Value: 623 Ohm
Lead Channel Pacing Threshold Amplitude: 0.875 V
Lead Channel Pacing Threshold Amplitude: 0.875 V
Lead Channel Pacing Threshold Pulse Width: 0.4 ms
Lead Channel Pacing Threshold Pulse Width: 0.4 ms
Lead Channel Setting Pacing Amplitude: 2 V
Lead Channel Setting Pacing Amplitude: 2.5 V
Lead Channel Setting Pacing Pulse Width: 0.4 ms
Lead Channel Setting Sensing Sensitivity: 2.8 mV

## 2019-08-08 NOTE — Progress Notes (Signed)
Remote pacemaker transmission.   

## 2019-08-14 ENCOUNTER — Telehealth: Payer: Self-pay

## 2019-08-14 NOTE — Telephone Encounter (Signed)
New message   The patient wants to transfer services over from Dr. Ethelene Hal asking are willing to accept her as a TOC.    Please advise

## 2019-08-18 NOTE — Telephone Encounter (Signed)
F/u    Call the patient with Dr.Burns recommendation asking can the requested be sent to Dr. Sharlet Salina on accepting her as a TOC.

## 2019-08-18 NOTE — Telephone Encounter (Signed)
Not taking new patients sorry.

## 2019-08-18 NOTE — Telephone Encounter (Signed)
I am not currently accepting new patients.

## 2019-08-22 NOTE — Telephone Encounter (Signed)
F/u   Call patient is aware that Dr. Sharlet Salina recommendation.

## 2019-09-21 DIAGNOSIS — Z20822 Contact with and (suspected) exposure to covid-19: Secondary | ICD-10-CM | POA: Diagnosis not present

## 2019-09-22 ENCOUNTER — Other Ambulatory Visit: Payer: Self-pay | Admitting: Family Medicine

## 2019-10-15 ENCOUNTER — Telehealth: Payer: Self-pay | Admitting: Family Medicine

## 2019-10-15 NOTE — Telephone Encounter (Signed)
   Patient requesting an appointment at [facility]. She is currently a patient at the [facility] location, she states this is too far to drive for her. Are you able to see her as a TOC from Dr Ethelene Hal.

## 2019-10-15 NOTE — Telephone Encounter (Signed)
   Patient made aware to call in January for possible new patient/TOC appointment

## 2019-10-15 NOTE — Telephone Encounter (Signed)
I am sorry- not right now; we are too short staffed to take on other clinic's patient's right now.  She can check back in early January.

## 2019-10-21 DIAGNOSIS — Z1231 Encounter for screening mammogram for malignant neoplasm of breast: Secondary | ICD-10-CM | POA: Diagnosis not present

## 2019-10-21 DIAGNOSIS — Z681 Body mass index (BMI) 19 or less, adult: Secondary | ICD-10-CM | POA: Diagnosis not present

## 2019-10-21 DIAGNOSIS — Z01419 Encounter for gynecological examination (general) (routine) without abnormal findings: Secondary | ICD-10-CM | POA: Diagnosis not present

## 2019-11-02 ENCOUNTER — Other Ambulatory Visit: Payer: Self-pay | Admitting: Neurology

## 2019-11-04 ENCOUNTER — Ambulatory Visit: Payer: Medicare Other | Attending: Critical Care Medicine

## 2019-11-04 DIAGNOSIS — Z23 Encounter for immunization: Secondary | ICD-10-CM

## 2019-11-04 DIAGNOSIS — K1329 Other disturbances of oral epithelium, including tongue: Secondary | ICD-10-CM | POA: Diagnosis not present

## 2019-11-04 NOTE — Progress Notes (Signed)
   Covid-19 Vaccination Clinic  Name:  SARRA RACHELS    MRN: [id number] DOB: 1950-11-15  11/04/2019  Ms. Burkemper was observed post Covid-19 immunization for 15 minutes without incident. She was provided with Vaccine Information Sheet and instruction to access the V-Safe system.   Ms. Laneve was instructed to call 911 with any severe reactions post vaccine: Marland Kitchen Difficulty breathing  . Swelling of face and throat  . A fast heartbeat  . A bad rash all over body  . Dizziness and weakness

## 2019-11-05 ENCOUNTER — Telehealth: Payer: Self-pay | Admitting: *Deleted

## 2019-11-05 NOTE — Telephone Encounter (Signed)
   [facility] Pre-operative Risk Assessment    HEARTCARE STAFF: - Please ensure there is not already an duplicate clearance open for this procedure. - Under Visit Info/Reason for Call, type in Other and utilize the format Clearance MM/DD/YY or Clearance TBD. Do not use dashes or single digits. - If request is for dental extraction, please clarify the # of teeth to be extracted.  Request for surgical clearance:  1. What type of surgery is being performed? INCISIONAL SOFT TISSUE Bx; CONFIRMED THERE ARE NO TEETH TO BE EXTRACTED   2. When is this surgery scheduled? TBD   3. What type of clearance is required (medical clearance vs. Pharmacy clearance to hold med vs. Both)? BOTH  4. Are there any medications that need to be held prior to surgery and how long? ELIQUIS   5. Practice name and name of physician performing surgery? [facility]; DR. Romie Minus   6. What is the office phone number? [phone]   7.   What is the office fax number? [phone]  8.   Anesthesia type (None, local, MAC, general) ? LOCAL   Julaine Hua 11/05/2019, 1:18 PM  _________________________________________________________________   (provider comments below)

## 2019-11-05 NOTE — Telephone Encounter (Signed)
Pt is agreeable to pre op appt needed. Pt scheduled to see Cecilie Kicks, NP 3:15 11/06/19. Pt thanked me for the call and the help. Will forward notes to NP for upcoming appt. Will remove from the pre op call back pool.

## 2019-11-05 NOTE — Telephone Encounter (Signed)
Primary Cardiologist:Amy Johnsie Cancel, MD  Chart reviewed as part of pre-operative protocol coverage. Because of Amy Lane past medical history and time since last visit, he/she will require a follow-up visit in order to better assess preoperative cardiovascular risk.  Pre-op covering staff: - Please schedule appointment and call patient to inform them. - Please contact requesting surgeon's office via preferred method (i.e, phone, fax) to inform them of need for appointment prior to surgery.  If applicable, this message will also be routed to pharmacy pool and/or primary cardiologist for input on holding anticoagulant/antiplatelet agent as requested below so that this information is available at time of patient's appointment.   Amy Pelton, NP  11/05/2019, 2:56 PM

## 2019-11-05 NOTE — Telephone Encounter (Signed)
Patient with diagnosis of afib on Eliquis for anticoagulation.    Procedure: INCISIONAL SOFT TISSUE Bx; CONFIRMED THERE ARE NO TEETH TO BE EXTRACTED  Date of procedure: TBD  CHADS2-VASc score of 5 (age, sex, CAD, TIA/stroke). Also has history of DVT noted in 2017.  CrCl 88mL/min Platelet count 163K  Per office protocol, patient can hold Eliquis for 1 day prior to procedure. Resume as soon as safely possible after due to elevated cardiac risk off of anticoagulation.

## 2019-11-06 ENCOUNTER — Other Ambulatory Visit: Payer: Self-pay | Admitting: Cardiovascular Disease

## 2019-11-06 ENCOUNTER — Other Ambulatory Visit: Payer: Self-pay

## 2019-11-06 ENCOUNTER — Encounter: Payer: Self-pay | Admitting: Cardiology

## 2019-11-06 ENCOUNTER — Ambulatory Visit (INDEPENDENT_AMBULATORY_CARE_PROVIDER_SITE_OTHER): Payer: Medicare Other

## 2019-11-06 ENCOUNTER — Other Ambulatory Visit: Payer: Medicare Other | Admitting: *Deleted

## 2019-11-06 ENCOUNTER — Ambulatory Visit (INDEPENDENT_AMBULATORY_CARE_PROVIDER_SITE_OTHER): Payer: Medicare Other | Admitting: Cardiology

## 2019-11-06 VITALS — BP 120/70 | HR 72 | Ht 70.5 in | Wt 136.2 lb

## 2019-11-06 DIAGNOSIS — Q796 Ehlers-Danlos syndrome, unspecified: Secondary | ICD-10-CM | POA: Diagnosis not present

## 2019-11-06 DIAGNOSIS — I480 Paroxysmal atrial fibrillation: Secondary | ICD-10-CM | POA: Diagnosis not present

## 2019-11-06 DIAGNOSIS — I498 Other specified cardiac arrhythmias: Secondary | ICD-10-CM | POA: Diagnosis not present

## 2019-11-06 DIAGNOSIS — G90A Postural orthostatic tachycardia syndrome (POTS): Secondary | ICD-10-CM

## 2019-11-06 DIAGNOSIS — R001 Bradycardia, unspecified: Secondary | ICD-10-CM

## 2019-11-06 DIAGNOSIS — Z01818 Encounter for other preprocedural examination: Secondary | ICD-10-CM | POA: Diagnosis not present

## 2019-11-06 DIAGNOSIS — I471 Supraventricular tachycardia: Secondary | ICD-10-CM | POA: Diagnosis not present

## 2019-11-06 DIAGNOSIS — I341 Nonrheumatic mitral (valve) prolapse: Secondary | ICD-10-CM | POA: Diagnosis not present

## 2019-11-06 DIAGNOSIS — Z950 Presence of cardiac pacemaker: Secondary | ICD-10-CM | POA: Diagnosis not present

## 2019-11-06 DIAGNOSIS — Z9889 Other specified postprocedural states: Secondary | ICD-10-CM

## 2019-11-06 NOTE — Progress Notes (Signed)
Cardiology Office Note   Date:  11/06/2019   ID:  Amy Lane, DOB 1950-11-19, MRN [id number]  PCP:  Libby Maw, MD  Cardiologist:  Dr. Johnsie Cancel Dr. Caryl Comes    Chief Complaint  Patient presents with  . Pre-op Exam      History of Present Illness: Amy Lane is a 69 y.o. female who presents for PPM, LQTS  history of bradycardia post pacer  Sees Dr Caryl Comes  status post generator replacement and lead repair March 2011.   She also has a history of Ehlers-Danlos syndrome in the context of a marfanoid habitus, mitral valve prolapse status post mitral valve repair at the [facility] clinic with prior TIA on chronic anticoagulation  as well as syncope due to diagnosis of long QT syndrome. 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 Of MS with LE weakness Unable to do MRI due to pacer Seen by Dr Jannifer Franklin Left visual field cut but  Carotids normal.   May in hospital with stroke/migraine like symptoms Confused and lost speech for 10 hours CT negative Rx migraine meds and to f/u with neurology Jannifer Franklin   Husband battling prostate cancer Increasingly less willing to travel with her   PPM normal function per EP with no PAF on device interrogation  Wanted to go to [place] in July but won't with new COVID starins    INCISIONAL SOFT TISSUE Bx; CONFIRMED THERE ARE NO TEETH TO BE EXTRACTED  CHA2DS2VASc of 5  Per office protocol, patient can hold Eliquis for 1 day prior to procedure. Resume as soon as safely possible after due to elevated cardiac risk off of anticoagulation.  No interval AFib; On Anticoagulation  No chest pain and no SOB. No  lightheadedness or dizziness.  No palpitations. Concern area in mouth is cancer or related to Ehler's-Danlos syndrome.  She is seeing head and neck surgeon and if further treatment needed he will do.    Past Medical History:  Diagnosis Date  . Abnormal gait   . Allergic rhinitis   . Anxiety   . Atrial fibrillation ([place])   . Carcinomas, basal cell   . Cardiac pacemaker    DDD  MDT  . Cerebrovascular disease   . Clotting disorder (H[facility])    DVT  . Depression   . Dysautonomia ([place])   . Ehler's-Danlos syndrome   . Fibromyalgia   . Headache(784.0)    Migraine  . Heart murmur   . Hemorrhoids   . Herpes zoster   . History of syncope   . History of tachycardia-bradycardia syndrome   . History of transient ischemic attack   . Hypercholesteremia   . Hypercholesterolemia   . Hypothyroidism   . Long term (current) use of anticoagulants   . Migraine with aura 06/21/2017   Episodes of aphasia and confusion  . Mitral valve prolapse   . PAT (paroxysmal atrial tachycardia) ([place])   . PSVT (paroxysmal supraventricular tachycardia) ([place])   . Skin desquamation    inflammative vaginitis  . Stroke ([place])   . SVT (supraventricular tachycardia) (HCC)     Past Surgical History:  Procedure Laterality Date  . ABLATION  x 2  . CAROTID ENDARTERECTOMY    . complex mitral valve repair     at the [facility]  . INSERT / REPLACE / REMOVE PACEMAKER    . LEAD REVISION    . left carotid endarterectomy  11/2003   by Dr. Delton See  . PACEMAKER INSERTION     medtronic kappa 901  . removed chalazion from left eye lid  09/2011   Dr. Satira Sark     Current Outpatient Medications  Medication Sig Dispense Refill  . acebutolol (SECTRAL) 200 MG capsule Take 1 capsule (200 mg total) by mouth at bedtime. 90 capsule 3  . amoxicillin (AMOXIL) 500 MG capsule Take 1,000 mg by mouth 2 (two) times daily.    . citalopram (CELEXA) 20 MG tablet TAKE 1 TABLET BY MOUTH EVERY DAY 90 tablet 0  . donepezil (ARICEPT) 5  MG tablet TAKE 1 TABLET BY MOUTH EVERYDAY AT BEDTIME 90 tablet 3  . ELIQUIS 5 MG TABS tablet TAKE 1 TABLET BY MOUTH TWICE A DAY 180 tablet 1  . ezetimibe (ZETIA) 10 MG tablet TAKE 1 TABLET BY MOUTH DAILY. PLEASE KEEP UPCOMING APPT IN FEB WITH DR. Johnsie Cancel BEFORE ANYMORE REFILL 90 tablet 3  . levothyroxine (SYNTHROID) 88 MCG tablet TAKE 1 TABLET (88 MCG TOTAL) BY MOUTH DAILY BEFORE BREAKFAST. 90 tablet 2  . LORazepam (ATIVAN) 1 MG tablet Take 1 mg by mouth at bedtime.    . topiramate (TOPAMAX) 25 MG tablet TAKE 2 TABLETS (50 MG TOTAL) BY MOUTH AT BEDTIME. 180 tablet 0  . Azelastine-Fluticasone 137-50 MCG/ACT SUSP Place 1 spray into the nose as directed. 1 Bottle 3   No current facility-administered medications for this visit.    Allergies:   Other, Statins, Adhesive [tape], and Latex    Social History:  The patient  reports that she has never smoked. She has never used smokeless tobacco. She reports that she does not drink alcohol and does not use drugs.   Family History:  The patient's family history includes Alzheimer's disease in her father; Cancer in an other family member; Diabetes in her sister; Ehlers-Danlos syndrome in her father; Heart disease in her father and another family member; Leukemia in an other family member; Lung cancer in her mother; Mitral valve prolapse in her brother and sister; Prostate cancer in her father.    ROS:  General:no colds or fevers, + weight loss Skin:no rashes or ulcers HEENT:no blurred vision, no congestion-oral lesion to be biopsied  CV:see HPI PUL:see HPI GI:no diarrhea constipation or melena, no indigestion GU:no hematuria, no dysuria MS:no joint pain, no claudication Neuro:no syncope, no lightheadedness Endo:no diabetes, + thyroid disease  Wt Readings from Last 3 Encounters:  11/06/19 136 lb 3.2 oz (61.8 kg)  04/03/19 144 lb (65.3 kg)  03/12/19 144 lb 12.8 oz (65.7 kg)     PHYSICAL EXAM: VS:  BP 120/70   Pulse 72   Ht 5' 10.5" (1.791 m)    Wt 136 lb 3.2 oz (61.8 kg)   SpO2 96%   BMI 19.27 kg/m  , BMI Body mass index is 19.27 kg/m. General:Pleasant affect, NAD Skin:Warm and dry, brisk capillary refill HEENT:normocephalic, sclera clear, mucus membranes moist Neck:supple, no JVD, no bruits  Heart:S1S2 RRR without murmur, gallup, rub or click Lungs:clear without rales, rhonchi, or wheezes GYI:RSWN, non tender, + BS, do not palpate liver spleen or masses Ext:no lower ext edema, 2+ pedal pulses, 2+ radial pulses Neuro:alert and oriented X3 , MAE, follows commands, + facial  symmetry    EKG:  EKG is ordered today. The ekg ordered today demonstrates SR with RBBB occ PAC Qtc 470 stable.    Recent Labs: No results found for requested labs within last 8760 hours.    Lipid Panel    Component Value Date/Time   CHOL 166 07/13/2017 1051   TRIG 80.0 07/13/2017 1051   HDL 56.20 07/13/2017 1051   CHOLHDL 3 07/13/2017 1051   VLDL 16.0 07/13/2017 1051   LDLCALC 93 07/13/2017 1051   LDLDIRECT 101.0 10/07/2018 1131       Other studies Reviewed: Additional studies/ records that were reviewed today include: . Echo 04/11/16  Study Conclusions   - Left ventricle: The cavity size was normal. There was mild  concentric hypertrophy. Systolic function was normal. The  estimated ejection fraction was in the range of 60% to 65%. Wall  motion was normal; there were no regional wall motion  abnormalities. Features are consistent with a pseudonormal left  ventricular filling pattern, with concomitant abnormal relaxation  and increased filling pressure (grade 2 diastolic dysfunction).  - Aortic valve: Trileaflet; mildly thickened, mildly calcified  leaflets.  - Mitral valve: Modetately thickened and myomatous mitral valve  leaflets s/p repair. Moderate diffuse thickening and  calcification of the anterior leaflet and posterior leaflet, with  mild involvement of chords. There was trivial regurgitation. Mean   gradient (D): 3 mm Hg. Valve area by pressure half-time: 2.93  cm^2. Valve area by continuity equation (using LVOT flow): 1.4  cm^2.  - Tricuspid valve: There was trivial regurgitation.  - Pulmonic valve: There was trivial regurgitation.   ASSESSMENT AND PLAN:  1.  Pre-op eval for oral soft tissue biopsy pt may hold eliquis for 24 hours prior to procedure and resume post op per surgeon when safe. She has ABX to take prior to procedure with hx of MV repair.   She is at acceptable risk for surgery, meets 4 METS without symptoms  2.  PPM stable per Dr. Aquilla Hacker last note and check 07/2019 with good battery status and no changes.   3.  PAF no further episodes on eliquis CHA2DS2VASC of 5   4.  POTS stable   5.  MVR last echo no murmur on exam will repeat echo in 2 weeks after surgery. In case she were to need more invasive surgery.   6.  Hx TIA   Current medicines are reviewed with the patient today.  The patient Has no concerns regarding medicines.  The following changes have been made:  See above Labs/ tests ordered today include:see above  Disposition:   FU:  see above  Signed, Cecilie Kicks, NP  11/06/2019 3:37 PM    [facility] [place], [place], [place], [place] Phone: [phone]; Fax: [phone]

## 2019-11-06 NOTE — Patient Instructions (Addendum)
Medication Instructions:  Your physician recommends that you continue on your current medications as directed. Please refer to the Current Medication list given to you today.  *If you need a refill on your cardiac medications before your next appointment, please call your pharmacy*   Lab Work: None ordered  If you have labs (blood work) drawn today and your tests are completely normal, you will receive your results only by: Marland Kitchen MyChart Message (if you have MyChart) OR . A paper copy in the mail If you have any lab test that is abnormal or we need to change your treatment, we will call you to review the results.   Testing/Procedures: Your physician has requested that you have an echocardiogram IN 2 WEEKS OR AFTER.  Echocardiography is a painless test that uses sound waves to create images of your heart. It provides your doctor with information about the size and shape of your heart and how well your heart's chambers and valves are working. This procedure takes approximately one hour. There are no restrictions for this procedure.    Follow-Up: At [facility], you and your health needs are our priority.  As part of our continuing mission to provide you with exceptional heart care, we have created designated Provider Care Teams.  These Care Teams include your primary Cardiologist (physician) and Advanced Practice Providers (APPs -  Physician Assistants and Nurse Practitioners) who all work together to provide you with the care you need, when you need it.  We recommend signing up for the patient portal called "MyChart".  Sign up information is provided on this After Visit Summary.  MyChart is used to connect with patients for Virtual Visits (Telemedicine).  Patients are able to view lab/test results, encounter notes, upcoming appointments, etc.  Non-urgent messages can be sent to your provider as well.   To learn more about what you can do with MyChart, go to [email or web address].    Your  next appointment:   2 month(s)  The format for your next appointment:   In Person  Provider:   You may see [name], MD or one of the following Advanced Practice Providers on your designated Care Team:    [name], NP  Cecilie Kicks, NP  Kathyrn Drown, NP    Other Instructions YOU MAY HOLD YOUR ELIQUIS FOR 24 HOUR PRIOR TO YOUR PROCEDURE     Echocardiogram An echocardiogram is a procedure that uses painless sound waves (ultrasound) to produce an image of the heart. Images from an echocardiogram can provide important information about:  Signs of coronary artery disease (CAD).  Aneurysm detection. An aneurysm is a weak or damaged part of an artery wall that bulges out from the normal force of blood pumping through the body.  Heart size and shape. Changes in the size or shape of the heart can be associated with certain conditions, including heart failure, aneurysm, and CAD.  Heart muscle function.  Heart valve function.  Signs of a past heart attack.  Fluid buildup around the heart.  Thickening of the heart muscle.  A tumor or infectious growth around the heart valves. Tell a health care provider about:  Any allergies you have.  All medicines you are taking, including vitamins, herbs, eye drops, creams, and over-the-counter medicines.  Any blood disorders you have.  Any surgeries you have had.  Any medical conditions you have.  Whether you are pregnant or may be pregnant. What are the risks? Generally, this is a safe procedure. However, problems may occur,  including:  Allergic reaction to dye (contrast) that may be used during the procedure. What happens before the procedure? No specific preparation is needed. You may eat and drink normally. What happens during the procedure?   An IV tube may be inserted into one of your veins.  You may receive contrast through this tube. A contrast is an injection that improves the quality of the pictures from  your heart.  A gel will be applied to your chest.  A wand-like tool (transducer) will be moved over your chest. The gel will help to transmit the sound waves from the transducer.  The sound waves will harmlessly bounce off of your heart to allow the heart images to be captured in real-time motion. The images will be recorded on a computer. The procedure may vary among health care providers and hospitals. What happens after the procedure?  You may return to your normal, everyday life, including diet, activities, and medicines, unless your health care provider tells you not to do that. Summary  An echocardiogram is a procedure that uses painless sound waves (ultrasound) to produce an image of the heart.  Images from an echocardiogram can provide important information about the size and shape of your heart, heart muscle function, heart valve function, and fluid buildup around your heart.  You do not need to do anything to prepare before this procedure. You may eat and drink normally.  After the echocardiogram is completed, you may return to your normal, everyday life, unless your health care provider tells you not to do that. This information is not intended to replace advice given to you by your health care provider. Make sure you discuss any questions you have with your health care provider. Document Revised: 05/02/2018 Document Reviewed: 02/12/2016 Elsevier Patient Education  [place].

## 2019-11-06 NOTE — Telephone Encounter (Addendum)
Eliquis 5mg  refill request received. Patient is 69 years old, weight-65.3kg, Crea- 0.82 on 10/07/2018-NEEDS LABS, Diagnosis-Afib, TIA, and last seen by Dr. Caryl Comes on 04/03/2019 via Telemedicine and pending an appt with Cecilie Kicks on 11/06/2019. Dose is appropriate based on dosing criteria.  Since pt is seeing Cecilie Kicks at 315pm will order a CBC and BMET for today while here. Notified CMA Anderson Malta so pt goes to the lab before leaving.   Pt here and labs were obtained, will send in refill.   11/07/19-labs resulted Cre-0.80 on yesterday-11/06/2019

## 2019-11-07 ENCOUNTER — Telehealth: Payer: Self-pay

## 2019-11-07 LAB — BASIC METABOLIC PANEL
BUN/Creatinine Ratio: 19 (ref 12–28)
BUN: 15 mg/dL (ref 8–27)
CO2: 22 mmol/L (ref 20–29)
Calcium: 9.1 mg/dL (ref 8.7–10.3)
Chloride: 101 mmol/L (ref 96–106)
Creatinine, Ser: 0.8 mg/dL (ref 0.57–1.00)
GFR calc Af Amer: 88 mL/min/{1.73_m2} (ref >59)
GFR calc non Af Amer: 77 mL/min/{1.73_m2} (ref >59)
Glucose: 133 mg/dL — ABNORMAL HIGH (ref 65–99)
Potassium: 4 mmol/L (ref 3.5–5.2)
Sodium: 136 mmol/L (ref 134–144)

## 2019-11-07 LAB — CBC
Hematocrit: 36.3 % (ref 34.0–46.6)
Hemoglobin: 12.4 g/dL (ref 11.1–15.9)
MCH: 30.5 pg (ref 26.6–33.0)
MCHC: 34.2 g/dL (ref 31.5–35.7)
MCV: 89 fL (ref 79–97)
Platelets: 163 10*3/uL (ref 150–450)
RBC: 4.07 x10E6/uL (ref 3.77–5.28)
RDW: 12.1 % (ref 11.7–15.4)
WBC: 4.4 10*3/uL (ref 3.4–10.8)

## 2019-11-07 NOTE — Telephone Encounter (Signed)
Called patient back. Made patient an appointment in December.

## 2019-11-07 NOTE — Telephone Encounter (Signed)
-----   Message from [facility] sent at 11/06/2019  4:10 PM EDT ----- Regarding: Patient appt Patient was told to get a 2 month in person fu with Dr Johnsie Cancel, can you give her a call with any availability you see. Thanks in advance!

## 2019-11-07 NOTE — Telephone Encounter (Signed)
Left message for patient to call back  

## 2019-11-07 NOTE — Telephone Encounter (Signed)
Elara is returning Pamela's call. Please advise.

## 2019-11-08 LAB — CUP PACEART REMOTE DEVICE CHECK
Battery Impedance: 1470 Ohm
Battery Remaining Longevity: 47 mo
Battery Voltage: 2.77 V
Brady Statistic AP VP Percent: 0 %
Brady Statistic AP VS Percent: 81 %
Brady Statistic AS VP Percent: 0 %
Brady Statistic AS VS Percent: 18 %
Date Time Interrogation Session: 20211014204225
Implantable Pulse Generator Implant Date: 20110310
Lead Channel Impedance Value: 398 Ohm
Lead Channel Impedance Value: 667 Ohm
Lead Channel Pacing Threshold Amplitude: 0.75 V
Lead Channel Pacing Threshold Amplitude: 1 V
Lead Channel Pacing Threshold Pulse Width: 0.4 ms
Lead Channel Pacing Threshold Pulse Width: 0.4 ms
Lead Channel Setting Pacing Amplitude: 2 V
Lead Channel Setting Pacing Amplitude: 2.5 V
Lead Channel Setting Pacing Pulse Width: 0.4 ms
Lead Channel Setting Sensing Sensitivity: 2.8 mV

## 2019-11-10 DIAGNOSIS — K1329 Other disturbances of oral epithelium, including tongue: Secondary | ICD-10-CM | POA: Diagnosis not present

## 2019-11-11 NOTE — Progress Notes (Signed)
Remote pacemaker transmission.   

## 2019-11-17 ENCOUNTER — Encounter: Payer: Self-pay | Admitting: Neurology

## 2019-11-17 ENCOUNTER — Ambulatory Visit (INDEPENDENT_AMBULATORY_CARE_PROVIDER_SITE_OTHER): Payer: Medicare Other | Admitting: Neurology

## 2019-11-17 VITALS — BP 119/68 | HR 73 | Ht 70.0 in | Wt 136.0 lb

## 2019-11-17 DIAGNOSIS — R269 Unspecified abnormalities of gait and mobility: Secondary | ICD-10-CM

## 2019-11-17 DIAGNOSIS — R413 Other amnesia: Secondary | ICD-10-CM

## 2019-11-17 DIAGNOSIS — Q796 Ehlers-Danlos syndrome, unspecified: Secondary | ICD-10-CM

## 2019-11-17 MED ORDER — DONEPEZIL HCL 5 MG PO TABS
ORAL_TABLET | ORAL | 3 refills | Status: DC
Start: 2019-11-17 — End: 2020-11-15

## 2019-11-17 NOTE — Progress Notes (Signed)
Reason for visit: Migraine headache, cerebrovascular disease, gait disorder  Amy Lane is an 69 y.o. female  History of present illness:  Amy Lane is a 69 year old right-handed white female with a history of Ehlers-Danlos syndrome and POTS.  The patient has a history of migraine but she has been very well controlled on Topamax.  As long she takes the medications she essentially has no headache whatsoever.  The patient has had some mild memory issues but she feels that she is doing quite well at this time.  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  She underwent some physical therapy for gait instability when last seen, this seemed to improve her walking ability.  She recently had a biopsy for a lesion in her oral mucosa, she has not gotten the results back from this yet.  She comes back here for further evaluation.  Past Medical History:  Diagnosis Date  . Abnormal gait   . Allergic rhinitis   . Anxiety   . Atrial fibrillation ([place])   . Carcinomas, basal cell   . Cardiac pacemaker    DDD  MDT  . Cerebrovascular disease   . Clotting disorder (HCC)    DVT  . Depression   . Dysautonomia ([place])   . Ehler's-Danlos syndrome   . Fibromyalgia   . Headache(784.0)    Migraine  . Heart murmur   . Hemorrhoids   . Herpes zoster   . History of syncope   . History of tachycardia-bradycardia syndrome   . History of transient ischemic attack   . Hypercholesteremia   . Hypercholesterolemia   . Hypothyroidism   . Long term (current) use of anticoagulants   . Migraine with aura 06/21/2017   Episodes of aphasia and confusion  . Mitral valve prolapse   . PAT (paroxysmal atrial tachycardia) ([place])   . PSVT (paroxysmal supraventricular tachycardia) ([place])   . Skin desquamation    inflammative vaginitis  . Stroke ([place])   . SVT (supraventricular tachycardia) (HCC)     Past Surgical History:  Procedure  Laterality Date  . ABLATION     x 2  . biopsy     gum  . CAROTID ENDARTERECTOMY    . complex mitral valve repair     at the [facility]  . INSERT / REPLACE / REMOVE PACEMAKER    . LEAD REVISION    . left carotid endarterectomy  11/2003   by Dr. Delton See  . PACEMAKER INSERTION     medtronic kappa [id number]  . removed chalazion from left eye lid  09/2011   Dr. Satira Sark    Family History  Problem Relation Age of Onset  . Lung cancer Mother   . Prostate cancer Father   . Alzheimer's disease Father   . Heart disease Father   . Ehlers-Danlos syndrome Father   . Mitral valve prolapse Brother   . Diabetes Sister   . Mitral valve prolapse Sister   . Leukemia Other        Nephew  . Heart disease Other        entire maternal side of family  . Cancer Other        entire maternal side of family    Social history:  reports that she has never smoked. She has never used smokeless tobacco. She reports that she does not drink alcohol and does not use drugs.    Allergies  Allergen Reactions  . Other Other (See Comments)    All QTc prolongation drugs  . Statins Other (See Comments)    Muscle aches  . Adhesive [Tape] Rash  . Latex Rash    Medications:  Prior to Admission medications   Medication Sig Start Date End Date Taking? Authorizing Provider  acebutolol (SECTRAL) 200 MG capsule Take 1 capsule (200 mg total) by mouth at bedtime. 05/08/19  Yes Josue Hector, MD  citalopram (CELEXA) 20 MG tablet TAKE 1 TABLET BY MOUTH EVERY DAY 09/22/19  Yes Libby Maw, MD  donepezil (ARICEPT) 5 MG tablet TAKE 1 TABLET BY MOUTH EVERYDAY AT BEDTIME 11/14/18  Yes Kathrynn Ducking, MD  ELIQUIS 5 MG TABS tablet TAKE 1 TABLET BY MOUTH TWICE A DAY 11/06/19  Yes Josue Hector, MD  ezetimibe (ZETIA) 10 MG tablet TAKE 1 TABLET BY MOUTH DAILY. PLEASE KEEP UPCOMING APPT IN FEB WITH DR. Johnsie Cancel BEFORE ANYMORE REFILL 03/31/19  Yes Josue Hector, MD  levothyroxine (SYNTHROID) 88 MCG tablet TAKE 1  TABLET (88 MCG TOTAL) BY MOUTH DAILY BEFORE BREAKFAST. 06/07/19  Yes Libby Maw, MD  LORazepam (ATIVAN) 1 MG tablet Take 1 mg by mouth at bedtime.   Yes [provider]  topiramate (TOPAMAX) 25 MG tablet TAKE 2 TABLETS (50 MG TOTAL) BY MOUTH AT BEDTIME. 11/03/19  Yes Kathrynn Ducking, MD  amoxicillin (AMOXIL) 500 MG capsule Take 1,000 mg by mouth 2 (two) times daily. 11/04/19   [provider]  Azelastine-Fluticasone 137-50 MCG/ACT SUSP Place 1 spray into the nose as directed. 11/16/17   Lauraine Rinne, NP    ROS:  Out of a complete 14 system review of symptoms, the patient complains only of the following symptoms, and all other reviewed systems are negative.  Mild walking troubles History of headache  Blood pressure 119/68, pulse 73, height 5\' 10"  (1.778 m), weight 136 lb (61.7 kg).  Physical Exam  General: The patient is alert and cooperative at the time of the examination.  Skin: No significant peripheral edema is noted.   Neurologic Exam  Mental status: The patient is alert and oriented x 3 at the time of the examination. The patient has apparent normal recent and remote memory, with an apparently normal attention span and concentration ability.   Cranial nerves: Facial symmetry is present. Speech is normal, no aphasia or dysarthria is noted. Extraocular movements are full. Visual fields are full.  Motor: The patient has good strength in all 4 extremities.  Sensory examination: Soft touch sensation is symmetric on the face, arms, and legs.  Coordination: The patient has good finger-nose-finger and heel-to-shin bilaterally.  Gait and station: The patient has a normal gait. Tandem gait is normal. Romberg is negative. No drift is seen.  Reflexes: Deep tendon reflexes are symmetric.   Assessment/Plan:  1.  Ehlers-Danlos syndrome  2.  History of migraine headache  3.  Mild cognitive impairment  4.  Mild gait instability  The patient is  doing quite well at this time.  She will continue on with the Topamax and the low-dose Aricept.  She will follow-up here in 1 year.  A prescription was sent in for the Aricept.  Amy Alexanders MD 11/17/2019 11:06 AM  [facility] [address] [place], [place] [address]  Phone [phone] Fax [phone]

## 2019-11-24 ENCOUNTER — Ambulatory Visit (HOSPITAL_COMMUNITY): Payer: Medicare Other | Attending: Cardiology

## 2019-11-24 ENCOUNTER — Other Ambulatory Visit: Payer: Self-pay

## 2019-11-24 DIAGNOSIS — I341 Nonrheumatic mitral (valve) prolapse: Secondary | ICD-10-CM | POA: Insufficient documentation

## 2019-11-24 DIAGNOSIS — Q796 Ehlers-Danlos syndrome, unspecified: Secondary | ICD-10-CM

## 2019-11-24 LAB — ECHOCARDIOGRAM COMPLETE
Area-P 1/2: 2.55 cm2
S' Lateral: 2.6 cm

## 2019-12-08 ENCOUNTER — Telehealth: Payer: Self-pay | Admitting: Internal Medicine

## 2019-12-08 ENCOUNTER — Ambulatory Visit: Payer: Medicare Other | Admitting: Family Medicine

## 2019-12-08 NOTE — Telephone Encounter (Signed)
Pt advised and verbalized understanding of her Echo results and will keep her 01/05/20 appt with Dr. Johnsie Cancel.

## 2019-12-08 NOTE — Telephone Encounter (Signed)
Pt called returning call to [facility] about her Echo Results    Best call back number [phone]

## 2019-12-09 ENCOUNTER — Ambulatory Visit (INDEPENDENT_AMBULATORY_CARE_PROVIDER_SITE_OTHER): Payer: Medicare Other

## 2019-12-09 VITALS — Ht 71.0 in | Wt 136.0 lb

## 2019-12-09 DIAGNOSIS — Z0000 Encounter for general adult medical examination without abnormal findings: Secondary | ICD-10-CM | POA: Diagnosis not present

## 2019-12-09 NOTE — Progress Notes (Addendum)
Subjective:   Amy Lane is a 69 y.o. female who presents for Medicare Annual (Subsequent) preventive examination.  I connected with Amy Lane today by telephone and verified that I am speaking with the correct person using two identifiers. Location patient: home Location provider: work Persons participating in the virtual visit: patient, Amy Lane.    I discussed the limitations, risks, security and privacy concerns of performing an evaluation and management service by telephone and the availability of in person appointments. I also discussed with the patient that there may be a patient responsible charge related to this service. The patient expressed understanding and verbally consented to this telephonic visit.    Interactive audio and video telecommunications were attempted between this provider and patient, however failed, due to patient having technical difficulties OR patient did not have access to video capability.  We continued and completed visit with audio only.  Some vital signs may be absent or patient reported.   Time Spent with patient on telephone encounter: 20 minutes   Review of Systems     Cardiac Risk Factors include: advanced age (>66men, >72 women);dyslipidemia     Objective:    Today's Vitals   12/09/19 1116  Weight: 136 lb (61.7 kg)  Height: 5\' 11"  (1.803 m)   Body mass index is 18.97 kg/m.  Advanced Directives 12/09/2019 01/03/2019 12/04/2018 04/27/2016 12/11/2014 04/06/2014 01/26/2014  Does Patient Have a Medical Advance Directive? Yes No Yes No Yes Yes No  Type of Paramedic of [place];Living will - [place];Living will - Healthcare Power of [place] -  Does patient want to make changes to medical advance directive? - - No - Patient declined - - - -  Copy of [place] in Chart? No - copy requested - No - copy requested - - - -  Would patient like information on  creating a medical advance directive? - - - No - Patient declined - - No - patient declined information    Current Medications (verified) Outpatient Encounter Medications as of 12/09/2019  Medication Sig  . acebutolol (SECTRAL) 200 MG capsule Take 1 capsule (200 mg total) by mouth at bedtime.  Marland Kitchen amoxicillin (AMOXIL) 500 MG capsule Take 1,000 mg by mouth 2 (two) times daily.  . citalopram (CELEXA) 20 MG tablet TAKE 1 TABLET BY MOUTH EVERY DAY  . donepezil (ARICEPT) 5 MG tablet TAKE 1 TABLET BY MOUTH EVERYDAY AT BEDTIME  . ELIQUIS 5 MG TABS tablet TAKE 1 TABLET BY MOUTH TWICE A DAY  . ezetimibe (ZETIA) 10 MG tablet TAKE 1 TABLET BY MOUTH DAILY. PLEASE KEEP UPCOMING APPT IN FEB WITH Amy Lane BEFORE ANYMORE REFILL  . levothyroxine (SYNTHROID) 88 MCG tablet TAKE 1 TABLET (88 MCG TOTAL) BY MOUTH DAILY BEFORE BREAKFAST.  Marland Kitchen LORazepam (ATIVAN) 1 MG tablet Take 1 mg by mouth at bedtime.  . topiramate (TOPAMAX) 25 MG tablet TAKE 2 TABLETS (50 MG TOTAL) BY MOUTH AT BEDTIME.  Marland Kitchen Azelastine-Fluticasone 137-50 MCG/ACT SUSP Place 1 spray into the nose as directed.   No facility-administered encounter medications on file as of 12/09/2019.    Allergies (verified) Other, Statins, Adhesive [tape], and Latex   History: Past Medical History:  Diagnosis Date  . Abnormal gait   . Allergic rhinitis   . Anxiety   . Atrial fibrillation ([place])   . Carcinomas, basal cell   . Cardiac pacemaker    DDD  MDT  . Cerebrovascular disease   .  Clotting disorder (HCC)    DVT  . Depression   . Dysautonomia ([place])   . Ehler's-Danlos syndrome   . Fibromyalgia   . Headache(784.0)    Migraine  . Heart murmur   . Hemorrhoids   . Herpes zoster   . History of syncope   . History of tachycardia-bradycardia syndrome   . History of transient ischemic attack   . Hypercholesteremia   . Hypercholesterolemia   . Hypothyroidism   . Long term (current) use of anticoagulants   . Migraine with aura 06/21/2017   Episodes of  aphasia and confusion  . Mitral valve prolapse   . PAT (paroxysmal atrial tachycardia) ([place])   . PSVT (paroxysmal supraventricular tachycardia) ([place])   . Skin desquamation    inflammative vaginitis  . Stroke ([place])   . SVT (supraventricular tachycardia) (HCC)    Past Surgical History:  Procedure Laterality Date  . ABLATION     x 2  . biopsy     gum  . CAROTID ENDARTERECTOMY    . complex mitral valve repair     at the [facility]  . INSERT / REPLACE / REMOVE PACEMAKER    . LEAD REVISION    . left carotid endarterectomy  11/2003   by Amy Lane  . PACEMAKER INSERTION     medtronic kappa 901  . removed chalazion from left eye lid  09/2011   Amy Lane   Family History  Problem Relation Age of Onset  . Lung cancer Mother   . Prostate cancer Father   . Alzheimer's disease Father   . Heart disease Father   . Ehlers-Danlos syndrome Father   . Mitral valve prolapse Brother   . Diabetes Sister   . Mitral valve prolapse Sister   . Leukemia Other        Nephew  . Heart disease Other        entire maternal side of family  . Cancer Other        entire maternal side of family   Social History   Socioeconomic History  . Marital status: Married    Spouse name: Amy Lane  . Number of children: 0  . Years of education: S-College  . Highest education level: Not on file  Occupational History  . Occupation: retired    Fish farm manager: RETIRED  Tobacco Use  . Smoking status: Never Smoker  . Smokeless tobacco: Never Used  . Tobacco comment: smoked in her early 20's--smoked 4 cigs per year   Vaping Use  . Vaping Use: Never used  Substance and Sexual Activity  . Alcohol use: No    Alcohol/week: 0.0 standard drinks    Comment: 2 drinks per year  . Drug use: No  . Sexual activity: Not on file  Other Topics Concern  . Not on file  Social History Narrative   Lives with spouse   Patient drinks about 2 cups of caffeine daily.   Patient is right handed.    Social Determinants of  Health   Financial Resource Strain: Low Risk   . Difficulty of Paying Living Expenses: Not hard at all  Food Insecurity: No Food Insecurity  . Worried About Charity fundraiser in the Last Year: Never true  . Ran Out of Food in the Last Year: Never true  Transportation Needs: No Transportation Needs  . Lack of Transportation (Medical): No  . Lack of Transportation (Non-Medical): No  Physical Activity: Inactive  . Days of Exercise per Week: 0  days  . Minutes of Exercise per Session: 0 min  Stress: No Stress Concern Present  . Feeling of Stress : Not at all  Social Connections: Moderately Integrated  . Frequency of Communication with Friends and Family: More than three times a week  . Frequency of Social Gatherings with Friends and Family: More than three times a week  . Attends Religious Services: More than 4 times per year  . Active Member of Clubs or Organizations: No  . Attends Archivist Meetings: Never  . Marital Status: Married    Tobacco Counseling Counseling given: Not Answered Comment: smoked in her early 20's--smoked 4 cigs per year    Clinical Intake:  Pre-visit preparation completed: Yes  Pain : No/denies pain     Nutritional Status: BMI <19  Underweight Nutritional Risks: None Diabetes: No  How often do you need to have someone help you when you read instructions, pamphlets, or other written materials from your doctor or pharmacy?: 1 - Never What is the last grade level you completed in school?: some college  Diabetic?No  Interpreter Needed?: No  Information entered by :: Caroleen Hamman LPN   Activities of Daily Living In your present state of health, do you have any difficulty performing the following activities: 12/09/2019  Hearing? N  Vision? N  Difficulty concentrating or making decisions? N  Walking or climbing stairs? N  Dressing or bathing? N  Doing errands, shopping? N  Preparing Food and eating ? N  Using the Toilet? N  In  the past six months, have you accidently leaked urine? N  Do you have problems with loss of bowel control? N  Managing your Medications? N  Managing your Finances? N  Housekeeping or managing your Housekeeping? N  Some recent data might be hidden    Patient Care Team: Libby Maw, MD as PCP - General (Family Medicine) Josue Hector, MD as PCP - Cardiology (Cardiology) Deboraha Sprang, MD as Consulting Physician (Cardiology)  Indicate any recent Medical Services you may have received from other than [facility] providers in the past year (date may be approximate).     Assessment:   This is a routine wellness examination for [place].  Hearing/Vision screen  Hearing Screening   125Hz  250Hz  500Hz  1000Hz  2000Hz  3000Hz  4000Hz  6000Hz  8000Hz   Right ear:           Left ear:           Comments: No issues  Vision Screening Comments: Wears glasses Last eye exam-04/2019-Dr. Dingledine  Dietary issues and exercise activities discussed: Current Exercise Habits: The patient has a physically strenuous job, but has no regular exercise apart from work., Exercise limited by: None identified  Goals    . Patient Stated     Continue eating healthy & drinking water      Depression Screen PHQ 2/9 Scores 12/09/2019 02/17/2019 12/04/2018 06/30/2016 05/01/2016  PHQ - 2 Score 0 0 0 0 0    Fall Risk Fall Risk  12/09/2019 02/17/2019 12/04/2018 04/27/2016  Falls in the past year? 0 0 1 Yes  Number falls in past yr: 0 - 0 1  Injury with Fall? 0 - 0 Yes  Comment - - - broke  L foot  Risk Factor Category  - - - High Fall Risk  Risk for fall due to : - - Impaired balance/gait Impaired balance/gait  Risk for fall due to: Comment - - - dizziness and lightheadedness  Follow up Falls prevention discussed -  Education provided;Falls prevention discussed -    Any stairs in or around the home? No  Home free of loose throw rugs in walkways, pet beds, electrical cords, etc? Yes  Adequate lighting in your  home to reduce risk of falls? Yes   ASSISTIVE DEVICES UTILIZED TO PREVENT FALLS:  Life alert? No  Use of a cane, walker or w/c? No  Grab bars in the bathroom? Yes  Shower chair or bench in shower? No  Elevated toilet seat or a handicapped toilet? No   TIMED UP AND GO:  Was the test performed? No . Phone visit   Cognitive Function:Patient sees a neurologist for memory deficiit. MMSE - Mini Mental State Exam 06/21/2017 06/20/2016 12/23/2015 06/15/2015 12/11/2014  Orientation to time 5 4 4 5 4   Orientation to Place 5 5 5 5 5   Registration 3 3 3 3 3   Attention/ Calculation 4 5 5 5 5   Recall 3 2 2 2 3   Language- name 2 objects 2 2 2 2 2   Language- repeat 1 1 1 1 1   Language- follow 3 step command 3 3 3 3 3   Language- read & follow direction 1 1 1 1 1   Write a sentence 1 1 1 1 1   Copy design 1 1 1 1 1   Total score 29 28 28 29 29         Immunizations Immunization History  Administered Date(s) Administered  . Fluad Quad(high Dose 65+) 10/07/2018  . Influenza Split 11/21/2010, 11/21/2011  . Influenza Whole 02/14/2007, 10/16/2007, 10/13/2009  . Influenza, High Dose Seasonal PF 12/12/2017  . Influenza,inj,Quad PF,6+ Mos 11/19/2012, 10/06/2013, 11/30/2014, 11/29/2015, 10/03/2016  . PFIZER SARS-COV-2 Vaccination 02/28/2019, 03/26/2019, 11/04/2019  . Pneumococcal Conjugate-13 05/31/2015  . Pneumococcal Polysaccharide-23 01/24/2003, 01/07/2018  . Td 04/16/2009    TDAP status: Due, Education has been provided regarding the importance of this vaccine. Advised may receive this vaccine at local pharmacy or Health Dept. Aware to provide a copy of the vaccination record if obtained from local pharmacy or Health Dept. Verbalized acceptance and understanding.   Flu vaccine status: Due- Patient plans to get the vaccine soon.  Pneumococcal vaccine status: Up to date   Covid-19 vaccine status: Completed vaccines  Qualifies for Shingles Vaccine? Yes   Zostavax completed No   Shingrix  Completed?: No.    Education has been provided regarding the importance of this vaccine. Patient has been advised to call insurance company to determine out of pocket expense if they have not yet received this vaccine. Advised may also receive vaccine at local pharmacy or Health Dept. Verbalized acceptance and understanding.  Screening Tests Health Maintenance  Topic Date Due  . TETANUS/TDAP  04/17/2019  . INFLUENZA VACCINE  08/24/2019  . MAMMOGRAM  09/23/2021  . COLONOSCOPY  01/05/2028  . DEXA SCAN  Completed  . COVID-19 Vaccine  Completed  . Hepatitis C Screening  Completed  . PNA vac Low Risk Adult  Completed    Health Maintenance  Health Maintenance Due  Topic Date Due  . TETANUS/TDAP  04/17/2019  . INFLUENZA VACCINE  08/24/2019    Colorectal cancer screening: Completed Colonoscopy 01/04/2018. Repeat every 10 years   Mammogram status: Completed Bilateral 09/25/2019. Repeat every year Completed at GYN office. Patient plans to bring a copy of report to Dr. Ethelene Hal.  Bone Density status: Completed 01/02/2019. Results reflect: Bone density results: OSTEOPENIA. Repeat every 2 years.  Lung Cancer Screening: (Low Dose CT Chest recommended if Age 65-80 years, 30 pack-year currently smoking  OR have quit w/in 15years.) does not qualify.     Additional Screening:  Hepatitis C Screening: Completed 06/12/2016  Vision Screening: Recommended annual ophthalmology exams for early detection of glaucoma and other disorders of the eye. Is the patient up to date with their annual eye exam?  Yes  Who is the provider or what is the name of the office in which the patient attends annual eye exams? Dr. Sandra Cockayne   Dental Screening: Recommended annual dental exams for proper oral hygiene  Community Resource Referral / Chronic Care Management: CRR required this visit?  No   CCM required this visit?  No      Plan:     I have personally reviewed and noted the following in the patient's  chart:   . Medical and social history . Use of alcohol, tobacco or illicit drugs  . Current medications and supplements . Functional ability and status . Nutritional status . Physical activity . Advanced directives . List of other physicians . Hospitalizations, surgeries, and ER visits in previous 12 months . Vitals . Screenings to include cognitive, depression, and falls . Referrals and appointments  In addition, I have reviewed and discussed with patient certain preventive protocols, quality metrics, and best practice recommendations. A written personalized care plan for preventive services as well as general preventive health recommendations were provided to patient.   Due to this being a telephonic visit, the after visit summary with patients personalized plan was offered to patient via mail or my-chart. Patient would like to access on my-chart.   Marta Antu, LPN   [id number]  Nurse Health Advisor  Nurse Notes: None   I have collaborated with the care management provider regarding care management and care coordination activities outlined in this encounter and have reviewed this encounter including documentation in the note and care plan. I am certifying that I agree with the content of this note and encounter as supervising physician.

## 2019-12-09 NOTE — Patient Instructions (Signed)
Amy Lane , Thank you for taking time to complete your Medicare Wellness Visit. I appreciate your ongoing commitment to your health goals. Please review the following plan we discussed and let me know if I can assist you in the future.   Screening recommendations/referrals: Colonoscopy: Completed 01/04/2018-Due-01/05/2028 Mammogram: Per our conversation today, completed 09/25/2019- Due-09/24/2020-Please have copy of results sent to Dr. Ethelene Hal. Bone Density: Completed 01/02/2019- Due 01/01/2021 Recommended yearly ophthalmology/optometry visit for glaucoma screening and checkup Recommended yearly dental visit for hygiene and checkup  Vaccinations: Influenza vaccine: Due- May obtain at our office or your local pharmacy. Pneumococcal vaccine: Completed vaccines Tdap vaccine: Discuss with pharmacy Shingles vaccine: Discuss with pharmacy  Covid-19:Completed vaccines  Advanced directives: Please bring a copy for your chart.  Conditions/risks identified: See problem list  Next appointment: Follow up in one year for your annual wellness visit 12/14/2020 @ 11:15am.   Preventive Care 65 Years and Older, Female Preventive care refers to lifestyle choices and visits with your health care provider that can promote health and wellness. What does preventive care include?  A yearly physical exam. This is also called an annual well check.  Dental exams once or twice a year.  Routine eye exams. Ask your health care provider how often you should have your eyes checked.  Personal lifestyle choices, including:  Daily care of your teeth and gums.  Regular physical activity.  Eating a healthy diet.  Avoiding tobacco and drug use.  Limiting alcohol use.  Practicing safe sex.  Taking low-dose aspirin every day.  Taking vitamin and mineral supplements as recommended by your health care provider. What happens during an annual well check? The services and screenings done by your health care  provider during your annual well check will depend on your age, overall health, lifestyle risk factors, and family history of disease. Counseling  Your health care provider may ask you questions about your:  Alcohol use.  Tobacco use.  Drug use.  Emotional well-being.  Home and relationship well-being.  Sexual activity.  Eating habits.  History of falls.  Memory and ability to understand (cognition).  Work and work Statistician.  Reproductive health. Screening  You may have the following tests or measurements:  Height, weight, and BMI.  Blood pressure.  Lipid and cholesterol levels. These may be checked every 5 years, or more frequently if you are over 14 years old.  Skin check.  Lung cancer screening. You may have this screening every year starting at age 66 if you have a 30-pack-year history of smoking and currently smoke or have quit within the past 15 years.  Fecal occult blood test (FOBT) of the stool. You may have this test every year starting at age 56.  Flexible sigmoidoscopy or colonoscopy. You may have a sigmoidoscopy every 5 years or a colonoscopy every 10 years starting at age 57.  Hepatitis C blood test.  Hepatitis B blood test.  Sexually transmitted disease (STD) testing.  Diabetes screening. This is done by checking your blood sugar (glucose) after you have not eaten for a while (fasting). You may have this done every 1-3 years.  Bone density scan. This is done to screen for osteoporosis. You may have this done starting at age 48.  Mammogram. This may be done every 1-2 years. Talk to your health care provider about how often you should have regular mammograms. Talk with your health care provider about your test results, treatment options, and if necessary, the need for more tests. Vaccines  Your  health care provider may recommend certain vaccines, such as:  Influenza vaccine. This is recommended every year.  Tetanus, diphtheria, and acellular  pertussis (Tdap, Td) vaccine. You may need a Td booster every 10 years.  Zoster vaccine. You may need this after age 18.  Pneumococcal 13-valent conjugate (PCV13) vaccine. One dose is recommended after age 33.  Pneumococcal polysaccharide (PPSV23) vaccine. One dose is recommended after age 93. Talk to your health care provider about which screenings and vaccines you need and how often you need them. This information is not intended to replace advice given to you by your health care provider. Make sure you discuss any questions you have with your health care provider. Document Released: 02/05/2015 Document Revised: 09/29/2015 Document Reviewed: 11/10/2014 Elsevier Interactive Patient Education  2017 [place] Prevention in the Home Falls can cause injuries. They can happen to people of all ages. There are many things you can do to make your home safe and to help prevent falls. What can I do on the outside of my home?  Regularly fix the edges of walkways and driveways and fix any cracks.  Remove anything that might make you trip as you walk through a door, such as a raised step or threshold.  Trim any bushes or trees on the path to your home.  Use bright outdoor lighting.  Clear any walking paths of anything that might make someone trip, such as rocks or tools.  Regularly check to see if handrails are loose or broken. Make sure that both sides of any steps have handrails.  Any raised decks and porches should have guardrails on the edges.  Have any leaves, snow, or ice cleared regularly.  Use sand or salt on walking paths during winter.  Clean up any spills in your garage right away. This includes oil or grease spills. What can I do in the bathroom?  Use night lights.  Install grab bars by the toilet and in the tub and shower. Do not use towel bars as grab bars.  Use non-skid mats or decals in the tub or shower.  If you need to sit down in the shower, use a plastic,  non-slip stool.  Keep the floor dry. Clean up any water that spills on the floor as soon as it happens.  Remove soap buildup in the tub or shower regularly.  Attach bath mats securely with double-sided non-slip rug tape.  Do not have throw rugs and other things on the floor that can make you trip. What can I do in the bedroom?  Use night lights.  Make sure that you have a light by your bed that is easy to reach.  Do not use any sheets or blankets that are too big for your bed. They should not hang down onto the floor.  Have a firm chair that has side arms. You can use this for support while you get dressed.  Do not have throw rugs and other things on the floor that can make you trip. What can I do in the kitchen?  Clean up any spills right away.  Avoid walking on wet floors.  Keep items that you use a lot in easy-to-reach places.  If you need to reach something above you, use a strong step stool that has a grab bar.  Keep electrical cords out of the way.  Do not use floor polish or wax that makes floors slippery. If you must use wax, use non-skid floor wax.  Do not  have throw rugs and other things on the floor that can make you trip. What can I do with my stairs?  Do not leave any items on the stairs.  Make sure that there are handrails on both sides of the stairs and use them. Fix handrails that are broken or loose. Make sure that handrails are as long as the stairways.  Check any carpeting to make sure that it is firmly attached to the stairs. Fix any carpet that is loose or worn.  Avoid having throw rugs at the top or bottom of the stairs. If you do have throw rugs, attach them to the floor with carpet tape.  Make sure that you have a light switch at the top of the stairs and the bottom of the stairs. If you do not have them, ask someone to add them for you. What else can I do to help prevent falls?  Wear shoes that:  Do not have high heels.  Have rubber  bottoms.  Are comfortable and fit you well.  Are closed at the toe. Do not wear sandals.  If you use a stepladder:  Make sure that it is fully opened. Do not climb a closed stepladder.  Make sure that both sides of the stepladder are locked into place.  Ask someone to hold it for you, if possible.  Clearly mark and make sure that you can see:  Any grab bars or handrails.  First and last steps.  Where the edge of each step is.  Use tools that help you move around (mobility aids) if they are needed. These include:  Canes.  Walkers.  Scooters.  Crutches.  Turn on the lights when you go into a dark area. Replace any light bulbs as soon as they burn out.  Set up your furniture so you have a clear path. Avoid moving your furniture around.  If any of your floors are uneven, fix them.  If there are any pets around you, be aware of where they are.  Review your medicines with your doctor. Some medicines can make you feel dizzy. This can increase your chance of falling. Ask your doctor what other things that you can do to help prevent falls. This information is not intended to replace advice given to you by your health care provider. Make sure you discuss any questions you have with your health care provider. Document Released: 11/05/2008 Document Revised: 06/17/2015 Document Reviewed: 02/13/2014 Elsevier Interactive Patient Education  2017 [employer].

## 2019-12-15 ENCOUNTER — Other Ambulatory Visit: Payer: Self-pay

## 2019-12-16 ENCOUNTER — Ambulatory Visit (INDEPENDENT_AMBULATORY_CARE_PROVIDER_SITE_OTHER): Payer: Medicare Other

## 2019-12-16 DIAGNOSIS — Z23 Encounter for immunization: Secondary | ICD-10-CM

## 2019-12-16 NOTE — Patient Instructions (Addendum)
Health Maintenance Due  Topic Date Due  . TETANUS/TDAP  04/17/2019  . INFLUENZA VACCINE  08/24/2019    Depression screen [facility] 2/9 12/09/2019 02/17/2019 12/04/2018  Decreased Interest 0 0 0  Down, Depressed, Hopeless 0 0 0  PHQ - 2 Score 0 0 0  Some recent data might be hidden

## 2019-12-16 NOTE — Progress Notes (Signed)
Per orders of Dr. Ethelene Hal  injection of Influenza Vaccine, High dose given by Ta Fair L Tamiki Kuba in right deltoid. Patient tolerated injection well. No signs or symptoms of a reaction were noted prior to patient leaving the nurse visit.

## 2019-12-17 ENCOUNTER — Other Ambulatory Visit: Payer: Self-pay | Admitting: Family Medicine

## 2019-12-29 ENCOUNTER — Other Ambulatory Visit: Payer: Self-pay | Admitting: Family Medicine

## 2020-01-01 NOTE — Progress Notes (Signed)
Patient ID: Amy Lane, female   DOB: Feb 12, 1950, 69 y.o.   MRN: [id number]     69 y.o. female history of bradycardia post pacer  Sees Dr Caryl Comes  status post generator replacement and lead repair March 2011. PAF quiescent She had history of POTS/Long QT and dysautonomia associated with Ehlers-Danlos syndrome. 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 Of MS with LE weakness Unable to do MRI due to pacer Seen by Dr Jannifer Franklin Left visual field cut but  Carotids normal.   May in hospital with stroke/migraine like symptoms Confused and lost speech for 10 hours CT negative Rx migraine meds and to f/u with neurology Jannifer Franklin   Husband battling prostate cancer Increasingly less willing to travel with her   PPM normal function per EP with no PAF on device interrogation   Has been intolerant to statins with myalgias. LDL around 100 on Zetia Discussed f/u with lipid clinic Repeat labs Consider PSK9/Nexlizet given vascular dx with CEA   ROS: Denies fever, malais, weight loss, blurry vision, decreased visual acuity, cough, sputum, SOB, hemoptysis, pleuritic pain, palpitaitons, heartburn, abdominal pain, melena, lower extremity edema, claudication, or rash.  All other systems reviewed and negative  General: BP (!) 100/54   Pulse 66   Ht 5\' 11"  (1.803 m)   Wt 60.8 kg   SpO2 98%   BMI 18.69 kg/m   Affect appropriate Healthy:  appears stated age 17: normal Neck supple with no adenopathy Post CEA  no thyromegaly Lungs clear with no wheezing and good diaphragmatic motion Heart:  S1/S2 no murmur, no rub, gallop or click PMI normal post right mini thoracotomy PPM under left clavicle  Abdomen: benighn, BS positve, no tenderness, no AAA no bruit.  No HSM or HJR Distal pulses  intact with no bruits No edema Neuro non-focal Skin warm and dry No muscular weakness   Current Outpatient Medications  Medication Sig Dispense Refill  . acebutolol (SECTRAL) 200 MG capsule Take 1 capsule (200 mg total) by mouth at bedtime. 90 capsule 3  . amoxicillin (AMOXIL) 500 MG capsule Take 1,000 mg by mouth 2 (two) times daily.    . citalopram (CELEXA) 20 MG tablet TAKE 1 TABLET BY MOUTH EVERY DAY 90 tablet 0  . donepezil (ARICEPT) 5 MG tablet TAKE 1 TABLET BY MOUTH EVERYDAY AT BEDTIME 90 tablet 3  . ELIQUIS 5 MG TABS tablet TAKE 1 TABLET BY MOUTH TWICE A DAY 180 tablet 1  . ezetimibe (ZETIA) 10 MG tablet TAKE 1 TABLET BY MOUTH DAILY. PLEASE KEEP UPCOMING APPT IN FEB WITH DR. Johnsie Cancel BEFORE ANYMORE REFILL 90 tablet 3  . levothyroxine (SYNTHROID) 88 MCG tablet TAKE 1 TABLET (88 MCG TOTAL) BY MOUTH DAILY BEFORE BREAKFAST. 90 tablet 2  . LORazepam (ATIVAN) 1 MG tablet TAKE 1 TABLET BY MOUTH THREE TIMES A DAY AS NEEDED FOR ANXIETY 90 tablet 0  . topiramate (TOPAMAX) 25 MG tablet TAKE 2 TABLETS (50 MG TOTAL) BY MOUTH AT BEDTIME. 180 tablet 0   No current facility-administered medications for this visit.    Allergies  Other, Statins, Adhesive [tape], and Latex  Electrocardiogram:  06/20/17  A pacing RBBB 03/12/19 SR rate 72 RBBB PAC   Assessment and Plan  Pacer:  Reviewed Dr Aquilla Hacker last note normal function normal remote transmission  ? When time Can it be changed out and be MRI compatible Normal PaCEART 11/06/19 reviewed   POTS:  Improved with no postural symptoms    TIA:  CEA patent by duplex 05/30/17 will update   Now on Eliquis with no bleeding issues ? Related to PAF Device with 0% PAF   MVrepair:  TTE 11/24/19 EF 55-60% trivial MR stable repair SBE prophylaxis   Thyroid:  Synthroid dose decreased TSH now normal   Chol:  On Zetia LDL 100 intolerant to statins    Neuro:  Continue aricept for cognitive dysfunction F//U DR Jannifer Franklin Migraine with TIA like symptoms now on Topamax    Ortho:  Patellar degeneration post cortisone injection f/u Giofre would not encourage surgery unless Pain gets worse    F/U with me in  6 months    [name]

## 2020-01-05 ENCOUNTER — Other Ambulatory Visit: Payer: Self-pay

## 2020-01-05 ENCOUNTER — Encounter: Payer: Self-pay | Admitting: Cardiovascular Disease

## 2020-01-05 ENCOUNTER — Ambulatory Visit (INDEPENDENT_AMBULATORY_CARE_PROVIDER_SITE_OTHER): Payer: Medicare Other | Admitting: Cardiovascular Disease

## 2020-01-05 VITALS — BP 100/54 | HR 66 | Ht 71.0 in | Wt 134.0 lb

## 2020-01-05 DIAGNOSIS — I779 Disorder of arteries and arterioles, unspecified: Secondary | ICD-10-CM

## 2020-01-05 DIAGNOSIS — Z9889 Other specified postprocedural states: Secondary | ICD-10-CM

## 2020-01-05 NOTE — Patient Instructions (Addendum)
Medication Instructions:  *If you need a refill on your cardiac medications before your next appointment, please call your pharmacy*  Lab Work: If you have labs (blood work) drawn today and your tests are completely normal, you will receive your results only by: Marland Kitchen MyChart Message (if you have MyChart) OR . A paper copy in the mail If you have any lab test that is abnormal or we need to change your treatment, we will call you to review the results.  Testing/Procedures: None ordered today.  Follow-Up: At [facility], you and your health needs are our priority.  As part of our continuing mission to provide you with exceptional heart care, we have created designated Provider Care Teams.  These Care Teams include your primary Cardiologist (physician) and Advanced Practice Providers (APPs -  Physician Assistants and Nurse Practitioners) who all work together to provide you with the care you need, when you need it.  We recommend signing up for the patient portal called "MyChart".  Sign up information is provided on this After Visit Summary.  MyChart is used to connect with patients for Virtual Visits (Telemedicine).  Patients are able to view lab/test results, encounter notes, upcoming appointments, etc.  Non-urgent messages can be sent to your provider as well.   To learn more about what you can do with MyChart, go to [email or web address].    Your next appointment:   5 to 6 months  The format for your next appointment:   In Person  Provider:   You may see [name], MD or one of the following Advanced Practice Providers on your designated Care Team:    [name], NP  Cecilie Kicks, NP  Kathyrn Drown, NP

## 2020-01-22 DIAGNOSIS — M79641 Pain in right hand: Secondary | ICD-10-CM | POA: Diagnosis not present

## 2020-01-22 DIAGNOSIS — M25562 Pain in left knee: Secondary | ICD-10-CM | POA: Diagnosis not present

## 2020-01-22 DIAGNOSIS — M79642 Pain in left hand: Secondary | ICD-10-CM | POA: Diagnosis not present

## 2020-01-27 ENCOUNTER — Other Ambulatory Visit: Payer: Self-pay | Admitting: Neurology

## 2020-01-30 ENCOUNTER — Other Ambulatory Visit: Payer: Self-pay | Admitting: Neurology

## 2020-02-05 ENCOUNTER — Ambulatory Visit (INDEPENDENT_AMBULATORY_CARE_PROVIDER_SITE_OTHER): Payer: Medicare Other

## 2020-02-05 DIAGNOSIS — R001 Bradycardia, unspecified: Secondary | ICD-10-CM | POA: Diagnosis not present

## 2020-02-10 LAB — CUP PACEART REMOTE DEVICE CHECK
Battery Impedance: 1523 Ohm
Battery Remaining Longevity: 46 mo
Battery Voltage: 2.77 V
Brady Statistic AP VP Percent: 0 %
Brady Statistic AP VS Percent: 81 %
Brady Statistic AS VP Percent: 0 %
Brady Statistic AS VS Percent: 18 %
Date Time Interrogation Session: 20220117151426
Implantable Pulse Generator Implant Date: 20110310
Lead Channel Impedance Value: 408 Ohm
Lead Channel Impedance Value: 667 Ohm
Lead Channel Pacing Threshold Amplitude: 0.875 V
Lead Channel Pacing Threshold Amplitude: 1 V
Lead Channel Pacing Threshold Pulse Width: 0.4 ms
Lead Channel Pacing Threshold Pulse Width: 0.4 ms
Lead Channel Setting Pacing Amplitude: 2 V
Lead Channel Setting Pacing Amplitude: 2.5 V
Lead Channel Setting Pacing Pulse Width: 0.4 ms
Lead Channel Setting Sensing Sensitivity: 2.8 mV

## 2020-02-12 ENCOUNTER — Other Ambulatory Visit: Payer: Self-pay | Admitting: Family

## 2020-02-16 ENCOUNTER — Ambulatory Visit: Payer: Medicare Other | Admitting: Family Medicine

## 2020-02-18 NOTE — Progress Notes (Signed)
Remote pacemaker transmission.   

## 2020-03-21 ENCOUNTER — Other Ambulatory Visit: Payer: Self-pay | Admitting: Family Medicine

## 2020-03-21 DIAGNOSIS — E039 Hypothyroidism, unspecified: Secondary | ICD-10-CM

## 2020-03-22 NOTE — Telephone Encounter (Signed)
Last VV 02/17/19 Last fill 06/07/19  #90/2 Next fill 03/29/20

## 2020-03-23 ENCOUNTER — Telehealth: Payer: Self-pay | Admitting: Family Medicine

## 2020-03-23 NOTE — Telephone Encounter (Signed)
Patient requesting TOC appointment with Dr.Jones due to location

## 2020-03-23 NOTE — Telephone Encounter (Signed)
Yes, I will see her 

## 2020-03-29 ENCOUNTER — Ambulatory Visit: Payer: Medicare Other | Admitting: Family Medicine

## 2020-04-15 ENCOUNTER — Encounter: Payer: Medicare Other | Admitting: Internal Medicine

## 2020-04-15 DIAGNOSIS — I498 Other specified cardiac arrhythmias: Secondary | ICD-10-CM

## 2020-04-15 DIAGNOSIS — Z950 Presence of cardiac pacemaker: Secondary | ICD-10-CM

## 2020-04-15 DIAGNOSIS — I480 Paroxysmal atrial fibrillation: Secondary | ICD-10-CM

## 2020-04-22 ENCOUNTER — Other Ambulatory Visit: Payer: Self-pay | Admitting: Neurology

## 2020-05-02 ENCOUNTER — Other Ambulatory Visit: Payer: Self-pay | Admitting: Cardiovascular Disease

## 2020-05-03 ENCOUNTER — Other Ambulatory Visit: Payer: Self-pay

## 2020-05-03 ENCOUNTER — Encounter: Payer: Self-pay | Admitting: Internal Medicine

## 2020-05-03 ENCOUNTER — Ambulatory Visit (INDEPENDENT_AMBULATORY_CARE_PROVIDER_SITE_OTHER): Payer: Medicare Other | Admitting: Internal Medicine

## 2020-05-03 VITALS — BP 120/68 | HR 78 | Ht 71.0 in | Wt 136.6 lb

## 2020-05-03 DIAGNOSIS — R001 Bradycardia, unspecified: Secondary | ICD-10-CM | POA: Diagnosis not present

## 2020-05-03 DIAGNOSIS — G901 Familial dysautonomia [Riley-Day]: Secondary | ICD-10-CM

## 2020-05-03 DIAGNOSIS — I495 Sick sinus syndrome: Secondary | ICD-10-CM | POA: Diagnosis not present

## 2020-05-03 DIAGNOSIS — I498 Other specified cardiac arrhythmias: Secondary | ICD-10-CM | POA: Diagnosis not present

## 2020-05-03 DIAGNOSIS — I471 Supraventricular tachycardia: Secondary | ICD-10-CM

## 2020-05-03 DIAGNOSIS — Z950 Presence of cardiac pacemaker: Secondary | ICD-10-CM | POA: Diagnosis not present

## 2020-05-03 DIAGNOSIS — G90A Postural orthostatic tachycardia syndrome (POTS): Secondary | ICD-10-CM

## 2020-05-03 NOTE — Patient Instructions (Signed)

## 2020-05-03 NOTE — Progress Notes (Signed)
Patient Care Team: Janith Lima, MD as PCP - General (Internal Medicine) Josue Hector, MD as PCP - Cardiology (Cardiology) Deboraha Sprang, MD as Consulting Physician (Cardiology) Dingeldein, Remo Lipps, MD (Ophthalmology) Key, Nelia Shi, NP as Nurse Practitioner (Gynecology) Kathrynn Ducking, MD as Consulting Physician (Neurology)   HPI  Amy Lane is a 70 y.o. female is seen in followup for sinus bradycardia status post pacemaker, status post generator replacement and lead repair March 2011. History of Ehlers-Danlos syndrome in the context of a marfanoid habitus, mitral valve prolapse status post mitral valve repair at the [facility] clinic with prior TIA on chronic Coumadin as well as syncope due to diagnosis of long QT syndrome. She has had significant dysautonomic symptoms consistent with POTS but most recently has been doing well   The patient denies chest pain, shortness of breath, nocturnal dyspnea, orthopnea or peripheral edema.  There have been no palpitations, lightheadedness or syncope.     Date Cr Hgb  5/18 0.72 13.7 (7/18)   10/21 0.8 12.4     Past Medical History:  Diagnosis Date  . Abnormal gait   . Allergic rhinitis   . Anxiety   . Atrial fibrillation ([place])   . Carcinomas, basal cell   . Cardiac pacemaker    DDD  MDT  . Cerebrovascular disease   . Clotting disorder (H[facility])    DVT  . Depression   . Dysautonomia ([place])   . Ehler's-Danlos syndrome   . Fibromyalgia   . Headache(784.0)    Migraine  . Heart murmur   . Hemorrhoids   . Herpes zoster   . History of syncope   . History of tachycardia-bradycardia syndrome   . History of transient ischemic attack   . Hypercholesteremia   . Hypercholesterolemia   . Hypothyroidism   . Long term (current) use of anticoagulants   . Migraine with aura 06/21/2017   Episodes of aphasia and confusion  . Mitral valve prolapse   . PAT (paroxysmal atrial tachycardia) ([place])   . PSVT (paroxysmal  supraventricular tachycardia) ([place])   . Skin desquamation    inflammative vaginitis  . Stroke ([place])   . SVT (supraventricular tachycardia) (H[facility])     Past Surgical History:  Procedure Laterality Date  . ABLATION     x 2  . biopsy     gum  . CAROTID ENDARTERECTOMY    . complex mitral valve repair     at the [facility]  . INSERT / REPLACE / REMOVE PACEMAKER    . LEAD REVISION    . left carotid endarterectomy  11/2003   by Dr. Delton See  . PACEMAKER INSERTION     medtronic kappa [id number]  . removed chalazion from left eye lid  09/2011   Dr. Satira Sark    Current Outpatient Medications  Medication Sig Dispense Refill  . acebutolol (SECTRAL) 200 MG capsule Take 1 capsule (200 mg total) by mouth at bedtime. 90 capsule 3  . amoxicillin (AMOXIL) 500 MG capsule Take 1,000 mg by mouth 2 (two) times daily.    . citalopram (CELEXA) 20 MG tablet TAKE 1 TABLET BY MOUTH EVERY DAY 90 tablet 0  . donepezil (ARICEPT) 5 MG tablet TAKE 1 TABLET BY MOUTH EVERYDAY AT BEDTIME 90 tablet 3  . ELIQUIS 5 MG TABS tablet TAKE 1 TABLET BY MOUTH TWICE A DAY 180 tablet 1  . ezetimibe (ZETIA) 10 MG tablet TAKE 1 TABLET BY MOUTH DAILY. PLEASE KEEP UPCOMING  APPT IN FEB WITH DR. Johnsie Cancel BEFORE ANYMORE REFILL 90 tablet 3  . levothyroxine (SYNTHROID) 88 MCG tablet TAKE 1 TABLET (88 MCG TOTAL) BY MOUTH DAILY BEFORE BREAKFAST. 90 tablet 0  . LORazepam (ATIVAN) 1 MG tablet TAKE 1 TABLET BY MOUTH THREE TIMES A DAY AS NEEDED FOR ANXIETY 90 tablet 0  . topiramate (TOPAMAX) 25 MG tablet TAKE 2 TABLETS BY MOUTH AT BEDTIME 180 tablet 1   No current facility-administered medications for this visit.    Allergies  Allergen Reactions  . Other Other (See Comments)    All QTc prolongation drugs  . Statins Other (See Comments)    Muscle aches  . Adhesive [Tape] Rash  . Latex Rash    Review of Systems negative except from HPI and PMH  Physical Exam BP 120/68 (BP Location: Left Arm, Patient Position: Sitting)   Pulse 78    Ht 5\' 11"  (1.803 m)   Wt 167 lb (75.8 kg)   SpO2 96%   BMI 23.29 kg/m  Well developed and nourished in no acute distress HENT normal Neck supple with JVP-  Flat  Clear Regular rate and rhythm, no murmurs or gallops Abd-soft with active BS No Clubbing cyanosis edema Skin-warm and dry A & Oriented  Grossly normal sensory and motor function  ECG  A pacing @ 60 occ PVC 40/12/41 Apace Vs interrval unchanged from 10/17   Assessment and  Plan  Recurrent TIA  Mitral valve repair  1AVB-profound  Possible long QT  POTS/dysautonomia  Marfanoid habitus  Atrial fibrillation-paroxysmal  Pacemaker The patient's device was interrogated.  The information was reviewed. No changes were made in the programming.    On Anticoagulation;  No bleeding issues   No syncope  No intercurrent atrial fibrillation or flutter  1AVB profound but unchanged-- she is not fit so 1AVB may be physiologically irrelevant but she may find this problematic as she tries

## 2020-05-06 ENCOUNTER — Ambulatory Visit (INDEPENDENT_AMBULATORY_CARE_PROVIDER_SITE_OTHER): Payer: Medicare Other

## 2020-05-06 DIAGNOSIS — I495 Sick sinus syndrome: Secondary | ICD-10-CM | POA: Diagnosis not present

## 2020-05-07 LAB — CUP PACEART REMOTE DEVICE CHECK
Battery Impedance: 1668 Ohm
Battery Remaining Longevity: 43 mo
Battery Voltage: 2.76 V
Brady Statistic AP VP Percent: 0 %
Brady Statistic AP VS Percent: 77 %
Brady Statistic AS VP Percent: 0 %
Brady Statistic AS VS Percent: 23 %
Date Time Interrogation Session: 20220415144634
Implantable Pulse Generator Implant Date: 20110310
Lead Channel Impedance Value: 403 Ohm
Lead Channel Impedance Value: 660 Ohm
Lead Channel Pacing Threshold Amplitude: 0.875 V
Lead Channel Pacing Threshold Amplitude: 1.125 V
Lead Channel Pacing Threshold Pulse Width: 0.4 ms
Lead Channel Pacing Threshold Pulse Width: 0.4 ms
Lead Channel Setting Pacing Amplitude: 2 V
Lead Channel Setting Pacing Amplitude: 2.5 V
Lead Channel Setting Pacing Pulse Width: 0.4 ms
Lead Channel Setting Sensing Sensitivity: 2.8 mV

## 2020-05-12 ENCOUNTER — Other Ambulatory Visit: Payer: Self-pay

## 2020-05-13 ENCOUNTER — Encounter: Payer: Self-pay | Admitting: Internal Medicine

## 2020-05-13 ENCOUNTER — Ambulatory Visit (INDEPENDENT_AMBULATORY_CARE_PROVIDER_SITE_OTHER): Payer: Medicare Other | Admitting: Internal Medicine

## 2020-05-13 ENCOUNTER — Other Ambulatory Visit: Payer: Self-pay

## 2020-05-13 VITALS — BP 122/82 | HR 85 | Temp 98.0°F | Ht 71.0 in | Wt 135.0 lb

## 2020-05-13 DIAGNOSIS — E559 Vitamin D deficiency, unspecified: Secondary | ICD-10-CM | POA: Diagnosis not present

## 2020-05-13 DIAGNOSIS — E785 Hyperlipidemia, unspecified: Secondary | ICD-10-CM | POA: Insufficient documentation

## 2020-05-13 DIAGNOSIS — E7800 Pure hypercholesterolemia, unspecified: Secondary | ICD-10-CM

## 2020-05-13 DIAGNOSIS — Z23 Encounter for immunization: Secondary | ICD-10-CM | POA: Diagnosis not present

## 2020-05-13 DIAGNOSIS — M810 Age-related osteoporosis without current pathological fracture: Secondary | ICD-10-CM | POA: Diagnosis not present

## 2020-05-13 DIAGNOSIS — L659 Nonscarring hair loss, unspecified: Secondary | ICD-10-CM | POA: Diagnosis not present

## 2020-05-13 DIAGNOSIS — E039 Hypothyroidism, unspecified: Secondary | ICD-10-CM

## 2020-05-13 LAB — CBC WITH DIFFERENTIAL/PLATELET
Basophils Absolute: 0 10*3/uL (ref 0.0–0.1)
Basophils Relative: 0.4 % (ref 0.0–3.0)
Eosinophils Absolute: 0.1 10*3/uL (ref 0.0–0.7)
Eosinophils Relative: 1.9 % (ref 0.0–5.0)
HCT: 38 % (ref 36.0–46.0)
Hemoglobin: 13.1 g/dL (ref 12.0–15.0)
Lymphocytes Relative: 30.3 % (ref 12.0–46.0)
Lymphs Abs: 1.7 10*3/uL (ref 0.7–4.0)
MCHC: 34.4 g/dL (ref 30.0–36.0)
MCV: 89.9 fl (ref 78.0–100.0)
Monocytes Absolute: 0.6 10*3/uL (ref 0.1–1.0)
Monocytes Relative: 11 % (ref 3.0–12.0)
Neutro Abs: 3.2 10*3/uL (ref 1.4–7.7)
Neutrophils Relative %: 56.4 % (ref 43.0–77.0)
Platelets: 169 10*3/uL (ref 150.0–400.0)
RBC: 4.23 Mil/uL (ref 3.87–5.11)
RDW: 13 % (ref 11.5–15.5)
WBC: 5.7 10*3/uL (ref 4.0–10.5)

## 2020-05-13 LAB — BASIC METABOLIC PANEL
BUN: 14 mg/dL (ref 6–23)
CO2: 27 mEq/L (ref 19–32)
Calcium: 9.5 mg/dL (ref 8.4–10.5)
Chloride: 100 mEq/L (ref 96–112)
Creatinine, Ser: 0.77 mg/dL (ref 0.40–1.20)
GFR: 79.64 mL/min (ref >60.00)
Glucose, Bld: 83 mg/dL (ref 70–99)
Potassium: 3.9 mEq/L (ref 3.5–5.1)
Sodium: 133 mEq/L — ABNORMAL LOW (ref 135–145)

## 2020-05-13 LAB — HEPATIC FUNCTION PANEL
ALT: 13 U/L (ref 0–35)
AST: 16 U/L (ref 0–37)
Albumin: 3.9 g/dL (ref 3.5–5.2)
Alkaline Phosphatase: 68 U/L (ref 39–117)
Bilirubin, Direct: 0.1 mg/dL (ref 0.0–0.3)
Total Bilirubin: 0.4 mg/dL (ref 0.2–1.2)
Total Protein: 7.1 g/dL (ref 6.0–8.3)

## 2020-05-13 LAB — LIPID PANEL
Cholesterol: 153 mg/dL (ref 0–200)
HDL: 51 mg/dL (ref >39.00)
LDL Cholesterol: 80 mg/dL (ref 0–99)
NonHDL: 101.84
Total CHOL/HDL Ratio: 3
Triglycerides: 109 mg/dL (ref 0.0–149.0)
VLDL: 21.8 mg/dL (ref 0.0–40.0)

## 2020-05-13 LAB — TSH: TSH: 0.06 u[IU]/mL — ABNORMAL LOW (ref 0.35–4.50)

## 2020-05-13 MED ORDER — FINASTERIDE 1 MG PO TABS: 1.0000 mg | ORAL_TABLET | Freq: Every day | ORAL | 3 refills | Status: DC

## 2020-05-13 MED ORDER — LEVOTHYROXINE SODIUM 50 MCG PO TABS
50.0000 ug | ORAL_TABLET | Freq: Every day | ORAL | 0 refills | Status: DC
Start: 2020-05-13 — End: 2020-07-29

## 2020-05-13 NOTE — Patient Instructions (Signed)
High Cholesterol  High cholesterol is a condition in which the blood has high levels of a white, waxy substance similar to fat (cholesterol). The liver makes all the cholesterol that the body needs. The human body needs small amounts of cholesterol to help build cells. A person gets extra or excess cholesterol from the food that he or she eats. The blood carries cholesterol from the liver to the rest of the body. If you have high cholesterol, deposits (plaques) may build up on the walls of your arteries. Arteries are the blood vessels that carry blood away from your heart. These plaques make the arteries narrow and stiff. Cholesterol plaques increase your risk for heart attack and stroke. Work with your health care provider to keep your cholesterol levels in a healthy range. What increases the risk? The following factors may make you more likely to develop this condition:  Eating foods that are high in animal fat (saturated fat) or cholesterol.  Being overweight.  Not getting enough exercise.  A family history of high cholesterol (familial hypercholesterolemia).  Use of tobacco products.  Having diabetes. What are the signs or symptoms? There are no symptoms of this condition. How is this diagnosed? This condition may be diagnosed based on the results of a blood test.  If you are older than 68 years of age, your health care provider may check your cholesterol levels every 4-6 years.  You may be checked more often if you have high cholesterol or other risk factors for heart disease. The blood test for cholesterol measures:  "Bad" cholesterol, or LDL cholesterol. This is the main type of cholesterol that causes heart disease. The desired level is less than 100 mg/dL.  "Good" cholesterol, or HDL cholesterol. HDL helps protect against heart disease by cleaning the arteries and carrying the LDL to the liver for processing. The desired level for HDL is 60 mg/dL or higher.  Triglycerides.  These are fats that your body can store or burn for energy. The desired level is less than 150 mg/dL.  Total cholesterol. This measures the total amount of cholesterol in your blood and includes LDL, HDL, and triglycerides. The desired level is less than 200 mg/dL. How is this treated? This condition may be treated with:  Diet changes. You may be asked to eat foods that have more fiber and less saturated fats or added sugar.  Lifestyle changes. These may include regular exercise, maintaining a healthy weight, and quitting use of tobacco products.  Medicines. These are given when diet and lifestyle changes have not worked. You may be prescribed a statin medicine to help lower your cholesterol levels. Follow these instructions at home: Eating and drinking  Eat a healthy, balanced diet. This diet includes: ? Daily servings of a variety of fresh, frozen, or canned fruits and vegetables. ? Daily servings of whole grain foods that are rich in fiber. ? Foods that are low in saturated fats and trans fats. These include poultry and fish without skin, lean cuts of meat, and low-fat dairy products. ? A variety of fish, especially oily fish that contain omega-3 fatty acids. Aim to eat fish at least 2 times a week.  Avoid foods and drinks that have added sugar.  Use healthy cooking methods, such as roasting, grilling, broiling, baking, poaching, steaming, and stir-frying. Do not fry your food except for stir-frying.   Lifestyle  Get regular exercise. Aim to exercise for a total of 150 minutes a week. Increase your activity level by doing activities   such as gardening, walking, and taking the stairs.  Do not use any products that contain nicotine or tobacco, such as cigarettes, e-cigarettes, and chewing tobacco. If you need help quitting, ask your health care provider.   General instructions  Take over-the-counter and prescription medicines only as told by your health care provider.  Keep all  follow-up visits as told by your health care provider. This is important. Where to find more information  American Heart Association: www.heart.org  National Heart, Lung, and Blood Institute: www.nhlbi.nih.gov Contact a health care provider if:  You have trouble achieving or maintaining a healthy diet or weight.  You are starting an exercise program.  You are unable to stop smoking. Get help right away if:  You have chest pain.  You have trouble breathing.  You have any symptoms of a stroke. "BE FAST" is an easy way to remember the main warning signs of a stroke: ? B - Balance. Signs are dizziness, sudden trouble walking, or loss of balance. ? E - Eyes. Signs are trouble seeing or a sudden change in vision. ? F - Face. Signs are sudden weakness or numbness of the face, or the face or eyelid drooping on one side. ? A - Arms. Signs are weakness or numbness in an arm. This happens suddenly and usually on one side of the body. ? S - Speech. Signs are sudden trouble speaking, slurred speech, or trouble understanding what people say. ? T - Time. Time to call emergency services. Write down what time symptoms started.  You have other signs of a stroke, such as: ? A sudden, severe headache with no known cause. ? Nausea or vomiting. ? Seizure. These symptoms may represent a serious problem that is an emergency. Do not wait to see if the symptoms will go away. Get medical help right away. Call your local emergency services (911 in the [place] Do not drive yourself to the hospital. Summary  Cholesterol plaques increase your risk for heart attack and stroke. Work with your health care provider to keep your cholesterol levels in a healthy range.  Eat a healthy, balanced diet, get regular exercise, and maintain a healthy weight.  Do not use any products that contain nicotine or tobacco, such as cigarettes, e-cigarettes, and chewing tobacco.  Get help right away if you have any symptoms of a  stroke. This information is not intended to replace advice given to you by your health care provider. Make sure you discuss any questions you have with your health care provider. Document Revised: 12/09/2018 Document Reviewed: 12/09/2018 Elsevier Patient Education  2021 Elsevier Inc.  

## 2020-05-13 NOTE — Progress Notes (Signed)
Subjective:  Patient ID: Amy Lane, female    DOB: 07/30/1950  Age: 70 y.o. MRN: [id number]  CC: Hypothyroidism and Hyperlipidemia  This visit occurred during the SARS-CoV-2 public health emergency.  Safety protocols were in place, including screening questions prior to the visit, additional usage of staff PPE, and extensive cleaning of exam room while observing appropriate contact time as indicated for disinfecting solutions.    HPI Amy Lane presents for f/up -  She complains of a several month history of thinning hair on her scalp and eyebrows.  She otherwise feels well and offers no other complaints.  Outpatient Medications Prior to Visit  Medication Sig Dispense Refill  . acebutolol (SECTRAL) 200 MG capsule TAKE 1 CAPSULE BY MOUTH AT BEDTIME. 90 capsule 3  . citalopram (CELEXA) 20 MG tablet TAKE 1 TABLET BY MOUTH EVERY DAY 90 tablet 0  . donepezil (ARICEPT) 5 MG tablet TAKE 1 TABLET BY MOUTH EVERYDAY AT BEDTIME 90 tablet 3  . ELIQUIS 5 MG TABS tablet TAKE 1 TABLET BY MOUTH TWICE A DAY 180 tablet 1  . ezetimibe (ZETIA) 10 MG tablet TAKE 1 TABLET BY MOUTH DAILY. PLEASE KEEP UPCOMING APPT IN FEB WITH DR. Johnsie Cancel BEFORE ANYMORE REFILL 90 tablet 3  . LORazepam (ATIVAN) 1 MG tablet TAKE 1 TABLET BY MOUTH THREE TIMES A DAY AS NEEDED FOR ANXIETY 90 tablet 0  . topiramate (TOPAMAX) 25 MG tablet TAKE 2 TABLETS BY MOUTH AT BEDTIME 180 tablet 1  . levothyroxine (SYNTHROID) 88 MCG tablet TAKE 1 TABLET (88 MCG TOTAL) BY MOUTH DAILY BEFORE BREAKFAST. 90 tablet 0  . amoxicillin (AMOXIL) 500 MG capsule Take 1,000 mg by mouth 2 (two) times daily.     No facility-administered medications prior to visit.    ROS Review of Systems  Constitutional: Negative for chills, diaphoresis, fatigue and fever.  HENT: Negative.   Eyes: Negative.   Respiratory: Negative for cough, chest tightness, shortness of breath and wheezing.   Cardiovascular: Negative for chest pain, palpitations and leg  swelling.  Gastrointestinal: Negative for abdominal pain, diarrhea and nausea.  Endocrine: Negative for cold intolerance and heat intolerance.  Genitourinary: Negative.  Negative for difficulty urinating.  Musculoskeletal: Negative.   Skin: Negative.  Negative for color change.  Neurological: Negative.  Negative for dizziness and weakness.  Hematological: Negative for adenopathy. Does not bruise/bleed easily.  Psychiatric/Behavioral: Negative.     Objective:  BP 122/82   Pulse 85   Temp 98 F (36.7 C) (Oral)   Ht 5\' 11"  (1.803 m)   Wt 135 lb (61.2 kg)   SpO2 97%   BMI 18.83 kg/m   BP Readings from Last 3 Encounters:  05/13/20 122/82  05/03/20 120/68  01/05/20 (!) 100/54    Wt Readings from Last 3 Encounters:  05/13/20 135 lb (61.2 kg)  05/03/20 136 lb 9.6 oz (62 kg)  01/05/20 134 lb (60.8 kg)    Physical Exam Vitals reviewed.  HENT:     Nose: Nose normal.     Mouth/Throat:     Mouth: Mucous membranes are moist.  Eyes:     General: No scleral icterus.    Conjunctiva/sclera: Conjunctivae normal.  Cardiovascular:     Rate and Rhythm: Normal rate and regular rhythm.  Pulmonary:     Effort: Pulmonary effort is normal.     Breath sounds: No stridor. No wheezing, rhonchi or rales.  Abdominal:     General: Abdomen is flat. Bowel sounds are normal. There is no  distension.     Palpations: Abdomen is soft. There is no hepatomegaly, splenomegaly or mass.  Musculoskeletal:        General: Normal range of motion.     Cervical back: Neck supple.     Right lower leg: No edema.  Lymphadenopathy:     Cervical: No cervical adenopathy.  Skin:    General: Skin is warm and dry.  Neurological:     General: No focal deficit present.     Mental Status: She is alert.  Psychiatric:        Mood and Affect: Mood normal.        Behavior: Behavior normal.     Lab Results  Component Value Date   WBC 5.7 05/13/2020   HGB 13.1 05/13/2020   HCT 38.0 05/13/2020   PLT 169.0  05/13/2020   GLUCOSE 83 05/13/2020   CHOL 153 05/13/2020   TRIG 109.0 05/13/2020   HDL 51.00 05/13/2020   LDLDIRECT 101.0 10/07/2018   LDLCALC 80 05/13/2020   ALT 13 05/13/2020   AST 16 05/13/2020   NA 133 (L) 05/13/2020   K 3.9 05/13/2020   CL 100 05/13/2020   CREATININE 0.77 05/13/2020   BUN 14 05/13/2020   CO2 27 05/13/2020   TSH 0.06 (L) 05/13/2020   INR 1.11 06/19/2017   HGBA1C 5.5 06/20/2017    DG Bone Density  Result Date: 01/02/2019 EXAM: DUAL X-RAY ABSORPTIOMETRY (DXA) FOR BONE MINERAL DENSITY IMPRESSION: Referring Physician:  [place] Your patient completed a BMD test using Lunar IDXA DXA system ( analysis version: 16 ) manufactured by [employer]. Technologist: CG PATIENT: Name: Amy Lane, Amy Lane Patient ID: [id number] Birth Date: [birth year] Height: 70.5 in. Sex: Female Measured: 01/02/2019 Weight: 139.8 lbs. Indications: Ehlers-Danlos syndrome, Caucasian, Celexa, Estrogen Deficient, History of Fracture (Adult) (V15.51), Levothyroxine, Postmenopausal Fractures: Foot Treatments: None ASSESSMENT: The BMD measured at AP Spine L1-L4 is 0.893 g/cm2 with a T-score of -2.4. This patient is considered osteopenic according to [facility]) criteria. The scan quality is good. Site Region Measured Date Measured Age YA BMD Significant CHANGE T-score AP Spine  L1-L4       01/02/2019    66.3         -2.4    0.893 g/cm2 DualFemur Total Right 01/02/2019    66.3         -1.7    0.789 g/cm2 DualFemur Total Mean  01/02/2019    66.3         -1.7    0.793 g/cm2 World Health Organization [facility]) criteria for post-menopausal, Caucasian Women: Normal       T-score at or above -1 SD Osteopenia   T-score between -1 and -2.5 SD Osteoporosis T-score at or below -2.5 SD RECOMMENDATION: 1. All patients should optimize calcium and vitamin D intake. 2. Consider FDA approved medical therapies in postmenopausal women and men aged 74 years and older, based on the following: a. A hip or  vertebral (clinical or morphometric) fracture b. T- score < or = -2.5 at the femoral neck or spine after appropriate evaluation to exclude secondary causes c. Low bone mass (T-score between -1.0 and -2.5 at the femoral neck or spine) and a 10 year probability of a hip fracture > or = 3% or a 10 year probability of a major osteoporosis-related fracture > or = 20% based on the [place]-adapted WHO algorithm d. Clinician judgment and/or patient preferences may indicate treatment for people with 10-year fracture probabilities above or below  these levels FOLLOW-UP: Patients with diagnosis of osteoporosis or at high risk for fracture should have regular bone mineral density tests. For patients eligible for Medicare, routine testing is allowed once every 2 years. The testing frequency can be increased to one year for patients who have rapidly progressing disease, those who are receiving or discontinuing medical therapy to restore bone mass, or have additional risk factors. FRAX* 10-year Probability of Fracture Based on femoral neck BMD: DualFemur (Right) Major Osteoporotic Fracture: 14.2% Hip Fracture:                2.1% Population:                  [place] (Caucasian) Risk Factors:                History of Fracture (Adult) (V15.51) *FRAX is a Materials engineer of the [employer] for Metabolic Bone Disease, a [place] (WHO) [facility]. ASSESSMENT: The probability of a major osteoporotic fracture is 14.2% within the next ten years. The probability of a hip fracture is 2.1% within the next ten years. Electronically Signed   By: Marlaine Hind M.D.   On: 01/02/2019 11:53    Assessment & Plan:   Rinnah was seen today for hypothyroidism and hyperlipidemia.  Diagnoses and all orders for this visit:  [place] -     finasteride (PROPECIA) 1 MG tablet; Take 1 tablet (1 mg total) by mouth daily.  Hypothyroidism, unspecified type- Her TSH is suppressed and she is symptomatic.   I recommended that she lower the dose of levothyroxine. -     CBC with Differential/Platelet; Future -     Lipid panel; Future -     TSH; Future -     Hepatic function panel; Future -     Hepatic function panel -     TSH -     Lipid panel -     CBC with Differential/Platelet -     levothyroxine (SYNTHROID) 50 MCG tablet; Take 1 tablet (50 mcg total) by mouth daily.  Vitamin D deficiency -     Basic metabolic panel; Future -     Basic metabolic panel  Hyperlipidemia with target LDL less than 130- She has a low ASCVD risk score so I did not recommend a statin for CV risk reduction. -     Lipid panel; Future -     TSH; Future -     Hepatic function panel; Future -     Hepatic function panel -     TSH -     Lipid panel  Osteoporosis without current pathological fracture, unspecified osteoporosis type -     Basic metabolic panel; Future -     Basic metabolic panel  Elevated LDL cholesterol level  Other orders -     Tdap vaccine greater than or equal to 7yo IM   I have discontinued Mardene Celeste T. Gugel's amoxicillin and levothyroxine. I am also having her start on finasteride and levothyroxine. Additionally, I am having her maintain her ezetimibe, Eliquis, donepezil, citalopram, LORazepam, topiramate, and acebutolol.  Meds ordered this encounter  Medications  . finasteride (PROPECIA) 1 MG tablet    Sig: Take 1 tablet (1 mg total) by mouth daily.    Dispense:  90 tablet    Refill:  3  . levothyroxine (SYNTHROID) 50 MCG tablet    Sig: Take 1 tablet (50 mcg total) by mouth daily.    Dispense:  90 tablet  Refill:  0     Follow-up: Return in about 6 months (around 11/12/2020).  Scarlette Calico, MD

## 2020-05-13 NOTE — Progress Notes (Signed)
Pt was informed of potential charge for TDAP shot today in office. She expressed understanding and wanted to proceed with immunization.   Jagjit Riner, CMA

## 2020-05-14 NOTE — Addendum Note (Signed)
Addended by: Maren Beach, Lanson Randle A on: 05/14/2020 05:21 PM   Modules accepted: Orders

## 2020-05-15 ENCOUNTER — Other Ambulatory Visit: Payer: Self-pay | Admitting: Cardiovascular Disease

## 2020-05-21 NOTE — Progress Notes (Signed)
Remote pacemaker transmission.   

## 2020-05-27 ENCOUNTER — Other Ambulatory Visit: Payer: Self-pay | Admitting: Cardiovascular Disease

## 2020-05-27 NOTE — Telephone Encounter (Signed)
Prescription refill request for Eliquis received. Indication: Afib  Last office visit: Caryl Comes, 05/03/2020 Scr: 0.77, 05/13/2020 Age: 70 Weight: 61.2 kg   Pt is on the correct dose of Eliquis per dosing criteria, prescription refill sent for Eliquis 5mg  BID.

## 2020-05-31 DIAGNOSIS — L821 Other seborrheic keratosis: Secondary | ICD-10-CM | POA: Diagnosis not present

## 2020-05-31 DIAGNOSIS — D229 Melanocytic nevi, unspecified: Secondary | ICD-10-CM | POA: Diagnosis not present

## 2020-05-31 DIAGNOSIS — L814 Other melanin hyperpigmentation: Secondary | ICD-10-CM | POA: Diagnosis not present

## 2020-05-31 DIAGNOSIS — H2513 Age-related nuclear cataract, bilateral: Secondary | ICD-10-CM | POA: Diagnosis not present

## 2020-05-31 DIAGNOSIS — L819 Disorder of pigmentation, unspecified: Secondary | ICD-10-CM | POA: Diagnosis not present

## 2020-05-31 DIAGNOSIS — D1801 Hemangioma of skin and subcutaneous tissue: Secondary | ICD-10-CM | POA: Diagnosis not present

## 2020-05-31 DIAGNOSIS — L718 Other rosacea: Secondary | ICD-10-CM | POA: Diagnosis not present

## 2020-05-31 DIAGNOSIS — I8393 Asymptomatic varicose veins of bilateral lower extremities: Secondary | ICD-10-CM | POA: Diagnosis not present

## 2020-06-08 NOTE — Progress Notes (Deleted)
Patient ID: Amy Lane, female   DOB: [birth year], 70 y.o.   MRN: [id number]      70 y.o. with Ehlers-Danlos syndrome History of PPM followed by Dr Caryl Comes with lead repair 2011 She has had PAF, POTS/Long QT and dysautonomia with postural hypotension. Left CEA no restenosis  On duplex 2019 She has developed cognitive defect improved on Aricept. Sees Dr Jannifer Franklin  Not clear That complex migraines cause some of her symptoms She has been intolerant to statins and is  Maintained on zetia.   Husband has prostate cancer and is doing a bit better lately   TTE 11/24/19 EF 55-60% intact MV repair with no significant residual MR  ***    ROS: Denies fever, malais, weight loss, blurry vision, decreased visual acuity, cough, sputum, SOB, hemoptysis, pleuritic pain, palpitaitons, heartburn, abdominal pain, melena, lower extremity edema, claudication, or rash.  All other systems reviewed and negative  General: There were no vitals taken for this visit.  Affect appropriate Healthy:  appears stated age 70: normal Neck supple with no adenopathy Post CEA  no thyromegaly Lungs clear with no wheezing and good diaphragmatic motion Heart:  S1/S2 no murmur, no rub, gallop or click PMI normal post right mini thoracotomy PPM under left clavicle  Abdomen: benighn, BS positve, no tenderness, no AAA no bruit.  No HSM or HJR Distal pulses intact with no bruits No edema Neuro non-focal Skin warm and dry No muscular weakness   Current Outpatient Medications  Medication Sig Dispense Refill  . acebutolol (SECTRAL) 200 MG capsule TAKE 1 CAPSULE BY MOUTH AT BEDTIME. 90 capsule 3  . citalopram (CELEXA) 20 MG tablet TAKE 1 TABLET BY MOUTH EVERY DAY 90 tablet 0  . donepezil (ARICEPT) 5 MG tablet TAKE 1 TABLET BY MOUTH EVERYDAY AT BEDTIME 90 tablet 3  . ELIQUIS 5 MG TABS tablet TAKE 1 TABLET BY MOUTH TWICE A DAY 180 tablet 1  . ezetimibe (ZETIA) 10 MG tablet TAKE 1 TABLET BY MOUTH DAILY. PLEASE KEEP UPCOMING  APPT IN FEB WITH DR. Johnsie Cancel BEFORE ANYMORE REFILL 90 tablet 1  . finasteride (PROPECIA) 1 MG tablet Take 1 tablet (1 mg total) by mouth daily. 90 tablet 3  . levothyroxine (SYNTHROID) 50 MCG tablet Take 1 tablet (50 mcg total) by mouth daily. 90 tablet 0  . LORazepam (ATIVAN) 1 MG tablet TAKE 1 TABLET BY MOUTH THREE TIMES A DAY AS NEEDED FOR ANXIETY 90 tablet 0  . topiramate (TOPAMAX) 25 MG tablet TAKE 2 TABLETS BY MOUTH AT BEDTIME 180 tablet 1   No current facility-administered medications for this visit.    Allergies  Other, Statins, Adhesive [tape], and Latex  Electrocardiogram:  06/20/17  A pacing RBBB 03/12/19 SR rate 72 RBBB PAC   Assessment and Plan  Pacer:  Reviewed Dr Aquilla Hacker long native first degree normal function PaCE ART reveiwed   POTS:  Improved no recent symptoms   TIA:  CEA patent by duplex 05/30/17 will update   Now on Eliquis with no bleeding issues ? Related to PAF Device with 0% PAF    MVrepair:  TTE 11/24/19 EF 55-60% trivial MR stable repair SBE prophylaxis no murmur on exam   Thyroid:  Synthroid dose decreased TSH now normal   Chol:  On Zetia LDL 100 intolerant to statins    Neuro:  Continue aricept for cognitive dysfunction F//U DR Jannifer Franklin Migraine with TIA like symptoms now on Topamax   Ortho:  Patellar degeneration post cortisone injection f/u [place] would not  encourage surgery unless Pain gets worse    F/U with me in  6 months    [name]

## 2020-06-14 ENCOUNTER — Ambulatory Visit: Payer: Medicare Other | Admitting: Cardiovascular Disease

## 2020-06-20 ENCOUNTER — Other Ambulatory Visit: Payer: Self-pay | Admitting: Family Medicine

## 2020-06-20 DIAGNOSIS — E039 Hypothyroidism, unspecified: Secondary | ICD-10-CM

## 2020-06-24 ENCOUNTER — Other Ambulatory Visit: Payer: Self-pay | Admitting: Family

## 2020-06-29 ENCOUNTER — Other Ambulatory Visit: Payer: Self-pay | Admitting: Internal Medicine

## 2020-06-29 ENCOUNTER — Telehealth: Payer: Self-pay | Admitting: Internal Medicine

## 2020-06-29 DIAGNOSIS — F411 Generalized anxiety disorder: Secondary | ICD-10-CM

## 2020-06-29 MED ORDER — LORAZEPAM 1 MG PO TABS: 1.0000 mg | ORAL_TABLET | Freq: Three times a day (TID) | ORAL | 3 refills | Status: DC | PRN

## 2020-06-29 NOTE — Telephone Encounter (Signed)
1.Medication Requested: LORazepam (ATIVAN) 1 MG tablet  2. Pharmacy (Name, [place], [place]):  [id number] - [place], [place] - [place] Phone:  [phone]  Fax:  [phone]      3. On Med List: Y  4. Last Visit with PCP: 4.21.22   5. Next visit date with PCP: not scheduled yet   Agent: Please be advised that RX refills may take up to 3 business days. We ask that you follow-up with your pharmacy.

## 2020-07-29 ENCOUNTER — Other Ambulatory Visit: Payer: Self-pay | Admitting: Internal Medicine

## 2020-07-29 ENCOUNTER — Other Ambulatory Visit: Payer: Self-pay | Admitting: Family

## 2020-07-29 DIAGNOSIS — E039 Hypothyroidism, unspecified: Secondary | ICD-10-CM

## 2020-08-05 ENCOUNTER — Ambulatory Visit (INDEPENDENT_AMBULATORY_CARE_PROVIDER_SITE_OTHER): Payer: Medicare Other

## 2020-08-05 DIAGNOSIS — I495 Sick sinus syndrome: Secondary | ICD-10-CM

## 2020-08-06 ENCOUNTER — Other Ambulatory Visit: Payer: Self-pay | Admitting: Internal Medicine

## 2020-08-06 ENCOUNTER — Encounter: Payer: Self-pay | Admitting: Internal Medicine

## 2020-08-06 DIAGNOSIS — F411 Generalized anxiety disorder: Secondary | ICD-10-CM

## 2020-08-06 LAB — CUP PACEART REMOTE DEVICE CHECK
Battery Impedance: 1722 Ohm
Battery Remaining Longevity: 41 mo
Battery Voltage: 2.76 V
Brady Statistic AP VP Percent: 0 %
Brady Statistic AP VS Percent: 86 %
Brady Statistic AS VP Percent: 0 %
Brady Statistic AS VS Percent: 14 %
Date Time Interrogation Session: 20220715005248
Implantable Pulse Generator Implant Date: 20110310
Lead Channel Impedance Value: 393 Ohm
Lead Channel Impedance Value: 620 Ohm
Lead Channel Pacing Threshold Amplitude: 0.875 V
Lead Channel Pacing Threshold Amplitude: 1.125 V
Lead Channel Pacing Threshold Pulse Width: 0.4 ms
Lead Channel Pacing Threshold Pulse Width: 0.4 ms
Lead Channel Setting Pacing Amplitude: 2 V
Lead Channel Setting Pacing Amplitude: 2.5 V
Lead Channel Setting Pacing Pulse Width: 0.4 ms
Lead Channel Setting Sensing Sensitivity: 2.8 mV

## 2020-08-06 MED ORDER — CITALOPRAM HYDROBROMIDE 20 MG PO TABS: 20.0000 mg | ORAL_TABLET | Freq: Every day | ORAL | 1 refills | Status: DC

## 2020-08-28 NOTE — Progress Notes (Signed)
Remote pacemaker transmission.   

## 2020-09-07 ENCOUNTER — Telehealth: Payer: Self-pay | Admitting: Neurology

## 2020-09-07 DIAGNOSIS — Q796 Ehlers-Danlos syndrome, unspecified: Secondary | ICD-10-CM

## 2020-09-07 DIAGNOSIS — H532 Diplopia: Secondary | ICD-10-CM

## 2020-09-07 NOTE — Telephone Encounter (Signed)
Called patient who stated she had double vision today, lasted a minute. She stated it all began last Thurs when her vision "messed up", and she got nauseous. She then developed sharp pains in back of her head. She's had this happen years ago and took gabapentin at that time. She's had confusion with talking on one day since Thurs. I reviewed signs of stroke with her. She stated if symptoms occur again she will call 911. I advised she must seek immediate medical attention if symptoms return. She is aware I will send this note to Dr Jannifer Franklin. Patient verbalized understanding, appreciation.

## 2020-09-07 NOTE — Telephone Encounter (Signed)
Pt called needing to speak to the RN regarding a shooting pain she has experienced down the back of her head and also the double vision she is having. Please advise.

## 2020-09-08 NOTE — Addendum Note (Signed)
Addended by: Kathrynn Ducking on: 09/08/2020 12:11 PM   Modules accepted: Orders

## 2020-09-08 NOTE — Telephone Encounter (Signed)
I called the patient.  Last Thursday the patient had onset of double vision and onset of right occipital headache associated with some nausea and a feeling that she may have diarrhea.  She has never had double vision before.  She improved over a couple days, but she had recurrence 2 days ago of double vision again without the headache.  These episodes are unusual for her, given her history of Ehlers-Danlos syndrome, I will try to get a CT angiogram if the dye is now available.  She cannot have MRI as she has a pacemaker in place.  She reported no focal numbness or weakness of the face, arms, legs.  The patient is on Eliquis.

## 2020-09-16 ENCOUNTER — Other Ambulatory Visit: Payer: Self-pay

## 2020-09-16 ENCOUNTER — Ambulatory Visit
Admission: RE | Admit: 2020-09-16 | Discharge: 2020-09-16 | Disposition: A | Discharge status: Home / Self Care | Payer: Medicare Other | Source: Ambulatory Visit | Attending: Neurology | Admitting: Neurology

## 2020-09-16 DIAGNOSIS — H532 Diplopia: Secondary | ICD-10-CM

## 2020-09-16 DIAGNOSIS — I672 Cerebral atherosclerosis: Secondary | ICD-10-CM | POA: Diagnosis not present

## 2020-09-16 DIAGNOSIS — Q796 Ehlers-Danlos syndrome, unspecified: Secondary | ICD-10-CM

## 2020-09-16 DIAGNOSIS — I6523 Occlusion and stenosis of bilateral carotid arteries: Secondary | ICD-10-CM | POA: Diagnosis not present

## 2020-09-16 DIAGNOSIS — I898 Other specified noninfective disorders of lymphatic vessels and lymph nodes: Secondary | ICD-10-CM | POA: Diagnosis not present

## 2020-09-16 MED ORDER — IOPAMIDOL (ISOVUE-370) INJECTION 76%
75.0000 mL | Freq: Once | INTRAVENOUS | Status: AC | PRN
  Administered 2020-09-16: 75 mL via INTRAVENOUS

## 2020-09-18 ENCOUNTER — Telehealth: Payer: Self-pay | Admitting: Neurology

## 2020-09-18 NOTE — Telephone Encounter (Signed)
I called the patient.  CT angiogram of the head and neck, CT of the brain were unremarkable.  Source of transient double vision not identified.  The patient will contact our office if she continues to have issues.    CTA head and neck 09/16/20:  IMPRESSION: CT head:   No evidence of acute intracranial abnormality.   CTA head:   1. No large vessel occlusion, proximal hemodynamically significant stenosis, or aneurysm. 2. Similar mild bilateral intracranial ICA dolichoectasia.   CTA neck:   Similar right carotid bifurcation atherosclerosis without greater than 50% stenosis.

## 2020-10-26 ENCOUNTER — Other Ambulatory Visit: Payer: Self-pay | Admitting: Internal Medicine

## 2020-10-26 ENCOUNTER — Other Ambulatory Visit: Payer: Self-pay | Admitting: Cardiovascular Disease

## 2020-10-26 DIAGNOSIS — E039 Hypothyroidism, unspecified: Secondary | ICD-10-CM

## 2020-11-04 ENCOUNTER — Ambulatory Visit (INDEPENDENT_AMBULATORY_CARE_PROVIDER_SITE_OTHER): Payer: Medicare Other

## 2020-11-04 DIAGNOSIS — I495 Sick sinus syndrome: Secondary | ICD-10-CM

## 2020-11-08 LAB — CUP PACEART REMOTE DEVICE CHECK
Battery Impedance: 1897 Ohm
Battery Remaining Longevity: 38 mo
Battery Voltage: 2.76 V
Brady Statistic AP VP Percent: 0 %
Brady Statistic AP VS Percent: 86 %
Brady Statistic AS VP Percent: 0 %
Brady Statistic AS VS Percent: 14 %
Date Time Interrogation Session: 20221014172104
Implantable Pulse Generator Implant Date: 20110310
Lead Channel Impedance Value: 420 Ohm
Lead Channel Impedance Value: 654 Ohm
Lead Channel Pacing Threshold Amplitude: 0.75 V
Lead Channel Pacing Threshold Amplitude: 1 V
Lead Channel Pacing Threshold Pulse Width: 0.4 ms
Lead Channel Pacing Threshold Pulse Width: 0.4 ms
Lead Channel Setting Pacing Amplitude: 2 V
Lead Channel Setting Pacing Amplitude: 2.5 V
Lead Channel Setting Pacing Pulse Width: 0.4 ms
Lead Channel Setting Sensing Sensitivity: 2.8 mV

## 2020-11-12 NOTE — Progress Notes (Signed)
Remote pacemaker transmission.   

## 2020-11-13 ENCOUNTER — Other Ambulatory Visit: Payer: Self-pay | Admitting: Neurology

## 2020-11-13 ENCOUNTER — Other Ambulatory Visit: Payer: Self-pay

## 2020-11-13 ENCOUNTER — Ambulatory Visit (INDEPENDENT_AMBULATORY_CARE_PROVIDER_SITE_OTHER): Payer: Medicare Other

## 2020-11-13 DIAGNOSIS — Z23 Encounter for immunization: Secondary | ICD-10-CM

## 2020-11-15 ENCOUNTER — Ambulatory Visit (INDEPENDENT_AMBULATORY_CARE_PROVIDER_SITE_OTHER): Payer: Medicare Other | Admitting: Neurology

## 2020-11-15 ENCOUNTER — Encounter: Payer: Self-pay | Admitting: Neurology

## 2020-11-15 VITALS — BP 98/61 | HR 67 | Ht 70.5 in | Wt 134.0 lb

## 2020-11-15 DIAGNOSIS — Q796 Ehlers-Danlos syndrome, unspecified: Secondary | ICD-10-CM | POA: Diagnosis not present

## 2020-11-15 DIAGNOSIS — G43109 Migraine with aura, not intractable, without status migrainosus: Secondary | ICD-10-CM | POA: Diagnosis not present

## 2020-11-15 DIAGNOSIS — R413 Other amnesia: Secondary | ICD-10-CM

## 2020-11-15 DIAGNOSIS — I679 Cerebrovascular disease, unspecified: Secondary | ICD-10-CM | POA: Diagnosis not present

## 2020-11-15 NOTE — Progress Notes (Signed)
Reason for visit: Ehlers-Danlos syndrome, cerebrovascular disease, migraine  ROSHAWNDA Lane is an 70 y.o. female  History of present illness:  Amy Lane is a 70 year old right-handed white female with a history of Ehlers-Danlos syndrome and cerebrovascular disease.  The patient is on anticoagulation, she has a pacemaker in place.  She has a history of migraine headaches but has not had any issues whatsoever since being on Topamax.  She has no headaches.  She did have an episode of double vision in August 2022, she had 3 separate episodes that resolved.  She had some nausea associated with this.  A repeat CT of the head and CT angiogram of the head and neck was unremarkable.  The patient has not had any recurrence of these episodes.  The patient returns to the office today for an evaluation.  She remains on low-dose donepezil but she really does not have any significant cognitive issues at this point.  We discussed coming off of donepezil last year but she was hesitant to do this.  Past Medical History:  Diagnosis Date   Abnormal gait    Allergic rhinitis    Anxiety    Atrial fibrillation (HCC)    Carcinomas, basal cell    Cardiac pacemaker    DDD  MDT   Cerebrovascular disease    Clotting disorder ([place])    DVT   Depression    Dysautonomia ([place])    Ehler's-Danlos syndrome    Fibromyalgia    Headache(784.0)    Migraine   Heart murmur    Hemorrhoids    Herpes zoster    History of syncope    History of tachycardia-bradycardia syndrome    History of transient ischemic attack    Hypercholesteremia    Hypercholesterolemia    Hypothyroidism    Long term (current) use of anticoagulants    Migraine with aura 06/21/2017   Episodes of aphasia and confusion   Mitral valve prolapse    PAT (paroxysmal atrial tachycardia) (HCC)    PSVT (paroxysmal supraventricular tachycardia) (HCC)    Skin desquamation    inflammative vaginitis   Stroke (HCC)    SVT (supraventricular  tachycardia) (HCC)     Past Surgical History:  Procedure Laterality Date   ABLATION     x 2   biopsy     gum   CAROTID ENDARTERECTOMY     complex mitral valve repair     at the [facility] / REPLACE / [place]     left carotid endarterectomy  11/2003   by Dr. Delton See   PACEMAKER INSERTION     medtronic kappa 901   removed chalazion from left eye lid  09/2011   Dr. Satira Sark    Family History  Problem Relation Age of Onset   Lung cancer Mother    Prostate cancer Father    Alzheimer's disease Father    Heart disease Father    Ehlers-Danlos syndrome Father    Mitral valve prolapse Brother    Diabetes Sister    Mitral valve prolapse Sister    Leukemia Other        Nephew   Heart disease Other        entire maternal side of family   Cancer Other        entire maternal side of family    Social history:  reports that she has never smoked. She has never used smokeless tobacco. She reports  that she does not drink alcohol and does not use drugs.    Allergies  Allergen Reactions   Other Other (See Comments)    All QTc prolongation drugs   Statins Other (See Comments)    Muscle aches   Adhesive [Tape] Rash   Latex Rash    Medications:  Prior to Admission medications   Medication Sig Start Date End Date Taking? Authorizing Provider  acebutolol (SECTRAL) 200 MG capsule TAKE 1 CAPSULE BY MOUTH AT BEDTIME. 05/04/20   Josue Hector, MD  citalopram (CELEXA) 20 MG tablet Take 1 tablet (20 mg total) by mouth daily. 08/06/20   Janith Lima, MD  donepezil (ARICEPT) 5 MG tablet TAKE 1 TABLET BY MOUTH EVERYDAY AT BEDTIME 11/17/19   Kathrynn Ducking, MD  ELIQUIS 5 MG TABS tablet TAKE 1 TABLET BY MOUTH TWICE A DAY 05/27/20   Josue Hector, MD  ezetimibe (ZETIA) 10 MG tablet TAKE 1 TABLET BY MOUTH DAILY. PLEASE KEEP UPCOMING APPT IN FEB WITH DR. Johnsie Cancel BEFORE ANYMORE REFILL 10/26/20   Josue Hector, MD  finasteride (PROPECIA) 1 MG tablet Take  1 tablet (1 mg total) by mouth daily. 05/13/20   Janith Lima, MD  levothyroxine (SYNTHROID) 50 MCG tablet TAKE 1 TABLET BY MOUTH EVERY DAY 07/29/20   Janith Lima, MD  LORazepam (ATIVAN) 1 MG tablet Take 1 tablet (1 mg total) by mouth every 8 (eight) hours as needed for anxiety. 06/29/20   Janith Lima, MD  topiramate (TOPAMAX) 25 MG tablet TAKE 2 TABLETS BY MOUTH AT BEDTIME 11/15/20   Kathrynn Ducking, MD    ROS:  Out of a complete 14 system review of symptoms, the patient complains only of the following symptoms, and all other reviewed systems are negative.  Double vision History of headache  Blood pressure 98/61, pulse 67, height 5' 10.5" (1.791 m), weight 134 lb (60.8 kg).  Physical Exam  General: The patient is alert and cooperative at the time of the examination.  Skin: No significant peripheral edema is noted.   Neurologic Exam  Mental status: The patient is alert and oriented x 3 at the time of the examination. The patient has apparent normal recent and remote memory, with an apparently normal attention span and concentration ability.   Cranial nerves: Facial symmetry is present. Speech is normal, no aphasia or dysarthria is noted. Extraocular movements are full. Visual fields are full.  Motor: The patient has good strength in all 4 extremities.  Sensory examination: Soft touch sensation is symmetric on the face, arms, and legs.  Coordination: The patient has good finger-nose-finger and heel-to-shin bilaterally.  Gait and station: The patient has a normal gait. Tandem gait is normal. Romberg is negative. No drift is seen.  Reflexes: Deep tendon reflexes are symmetric.   CTA head and neck 09/16/20:   IMPRESSION: CT head:   No evidence of acute intracranial abnormality.   CTA head:   1. No large vessel occlusion, proximal hemodynamically significant stenosis, or aneurysm. 2. Similar mild bilateral intracranial ICA dolichoectasia.   CTA neck:   Similar  right carotid bifurcation atherosclerosis without greater than 50% stenosis.  * CT scan images were reviewed online. I agree with the written report.    Assessment/Plan:  1.  Migraine headache  2.  Ehlers-Danlos syndrome  The patient will stop the Aricept at this point.  After 1 month, she will try to reduce the Topamax to 25 mg at night for 2 months and  if she does well without headache, she will stop that drug as well.  She will follow-up here in 1 year, sooner if needed.  In the future she can be followed through Dr. Jaynee Eagles.  [name] MD 11/15/2020 11:34 AM  [facility] [address] [place], [place] [address]  Phone [phone] Fax [phone]

## 2020-11-18 ENCOUNTER — Other Ambulatory Visit: Payer: Self-pay | Admitting: Internal Medicine

## 2020-11-18 DIAGNOSIS — E039 Hypothyroidism, unspecified: Secondary | ICD-10-CM

## 2020-12-04 ENCOUNTER — Other Ambulatory Visit: Payer: Self-pay | Admitting: Cardiovascular Disease

## 2020-12-04 DIAGNOSIS — M2242 Chondromalacia patellae, left knee: Secondary | ICD-10-CM | POA: Diagnosis not present

## 2020-12-06 NOTE — Telephone Encounter (Signed)
Prescription refill request for Eliquis received. Indication: Afib  Last office visit:05/03/20 Amy Lane)  Scr:0.77 (05/13/20)   Age: 70 Weight: 60.8kg  Appropriate dose and refill sent to requested pharmacy.

## 2020-12-14 ENCOUNTER — Ambulatory Visit: Payer: Medicare Other

## 2020-12-15 ENCOUNTER — Ambulatory Visit (INDEPENDENT_AMBULATORY_CARE_PROVIDER_SITE_OTHER): Payer: Medicare Other | Admitting: Internal Medicine

## 2020-12-15 ENCOUNTER — Encounter: Payer: Self-pay | Admitting: Internal Medicine

## 2020-12-15 ENCOUNTER — Other Ambulatory Visit: Payer: Self-pay

## 2020-12-15 VITALS — BP 116/66 | HR 71 | Temp 97.7°F | Ht 70.5 in | Wt 134.0 lb

## 2020-12-15 DIAGNOSIS — E871 Hypo-osmolality and hyponatremia: Secondary | ICD-10-CM | POA: Diagnosis not present

## 2020-12-15 DIAGNOSIS — E039 Hypothyroidism, unspecified: Secondary | ICD-10-CM | POA: Diagnosis not present

## 2020-12-15 DIAGNOSIS — Z1231 Encounter for screening mammogram for malignant neoplasm of breast: Secondary | ICD-10-CM | POA: Insufficient documentation

## 2020-12-15 LAB — BASIC METABOLIC PANEL
BUN: 13 mg/dL (ref 6–23)
CO2: 28 mEq/L (ref 19–32)
Calcium: 9.1 mg/dL (ref 8.4–10.5)
Chloride: 100 mEq/L (ref 96–112)
Creatinine, Ser: 0.96 mg/dL (ref 0.40–1.20)
GFR: 60.87 mL/min (ref >60.00)
Glucose, Bld: 79 mg/dL (ref 70–99)
Potassium: 4.1 mEq/L (ref 3.5–5.1)
Sodium: 132 mEq/L — ABNORMAL LOW (ref 135–145)

## 2020-12-15 LAB — TSH: TSH: 53.57 u[IU]/mL — ABNORMAL HIGH (ref 0.35–5.50)

## 2020-12-15 LAB — CORTISOL: Cortisol, Plasma: 13.1 ug/dL

## 2020-12-15 MED ORDER — LEVOTHYROXINE SODIUM 100 MCG PO TABS
100.0000 ug | ORAL_TABLET | Freq: Every day | ORAL | 0 refills | Status: DC
Start: 2020-12-15 — End: 2021-02-21

## 2020-12-15 NOTE — Progress Notes (Signed)
Subjective:  Patient ID: Amy Lane, female    DOB: [birth year]  Age: 70 y.o. MRN: [id number]  CC: Hypothyroidism  This visit occurred during the SARS-CoV-2 public health emergency.  Safety protocols were in place, including screening questions prior to the visit, additional usage of staff PPE, and extensive cleaning of exam room while observing appropriate contact time as indicated for disinfecting solutions.    HPI Amy Lane presents for f/up -   She complains of hair loss.  She denies changes in her weight, sleep, or bowel movements.  She denies palpitations, dizziness, or lightheadedness.  Outpatient Medications Prior to Visit  Medication Sig Dispense Refill   acebutolol (SECTRAL) 200 MG capsule TAKE 1 CAPSULE BY MOUTH AT BEDTIME. 90 capsule 3   citalopram (CELEXA) 20 MG tablet Take 1 tablet (20 mg total) by mouth daily. 90 tablet 1   ELIQUIS 5 MG TABS tablet TAKE 1 TABLET BY MOUTH TWICE A DAY 180 tablet 1   ezetimibe (ZETIA) 10 MG tablet TAKE 1 TABLET BY MOUTH DAILY. PLEASE KEEP UPCOMING APPT IN FEB WITH DR. Johnsie Cancel BEFORE ANYMORE REFILL 90 tablet 1   LORazepam (ATIVAN) 1 MG tablet Take 1 tablet (1 mg total) by mouth every 8 (eight) hours as needed for anxiety. 90 tablet 3   topiramate (TOPAMAX) 25 MG tablet TAKE 2 TABLETS BY MOUTH AT BEDTIME 180 tablet 1   finasteride (PROPECIA) 1 MG tablet Take 1 tablet (1 mg total) by mouth daily. 90 tablet 3   levothyroxine (SYNTHROID) 50 MCG tablet TAKE 1 TABLET BY MOUTH EVERY DAY 90 tablet 0   No facility-administered medications prior to visit.    ROS Review of Systems  Constitutional:  Negative for diaphoresis, fatigue and unexpected weight change.  HENT: Negative.    Eyes: Negative.   Respiratory:  Negative for cough, chest tightness, shortness of breath and wheezing.   Cardiovascular:  Negative for chest pain, palpitations and leg swelling.  Gastrointestinal:  Negative for abdominal pain, constipation, diarrhea, nausea  and vomiting.  Endocrine: Negative for cold intolerance and heat intolerance.  Genitourinary: Negative.  Negative for difficulty urinating.  Musculoskeletal: Negative.   Skin: Negative.        +++hair loss  Neurological:  Negative for dizziness, weakness, light-headedness and headaches.  Hematological:  Negative for adenopathy. Does not bruise/bleed easily.  Psychiatric/Behavioral: Negative.     Objective:  BP 116/66 (BP Location: Right Arm, Patient Position: Sitting, Cuff Size: Large)   Pulse 71   Temp 97.7 F (36.5 C) (Oral)   Ht 5' 10.5" (1.791 m)   Wt 134 lb (60.8 kg)   SpO2 98%   BMI 18.96 kg/m   BP Readings from Last 3 Encounters:  12/15/20 116/66  11/15/20 98/61  05/13/20 122/82    Wt Readings from Last 3 Encounters:  12/15/20 134 lb (60.8 kg)  11/15/20 134 lb (60.8 kg)  05/13/20 135 lb (61.2 kg)    Physical Exam Vitals reviewed.  Constitutional:      Appearance: Normal appearance.  HENT:     Nose: Nose normal.     Mouth/Throat:     Mouth: Mucous membranes are moist.  Eyes:     Conjunctiva/sclera: Conjunctivae normal.  Cardiovascular:     Rate and Rhythm: Normal rate and regular rhythm.     Heart sounds: No murmur heard. Pulmonary:     Effort: Pulmonary effort is normal.     Breath sounds: No stridor. No wheezing, rhonchi or rales.  Abdominal:  General: Abdomen is flat.     Palpations: There is no mass.     Tenderness: There is no abdominal tenderness. There is no guarding.  Musculoskeletal:        General: Normal range of motion.     Cervical back: Neck supple.     Right lower leg: No edema.     Left lower leg: No edema.  Lymphadenopathy:     Cervical: No cervical adenopathy.  Skin:    General: Skin is warm and dry.  Neurological:     General: No focal deficit present.     Mental Status: She is alert.  Psychiatric:        Mood and Affect: Mood normal.        Behavior: Behavior normal.    Lab Results  Component Value Date   WBC 5.7  05/13/2020   HGB 13.1 05/13/2020   HCT 38.0 05/13/2020   PLT 169.0 05/13/2020   GLUCOSE 79 12/15/2020   CHOL 153 05/13/2020   TRIG 109.0 05/13/2020   HDL 51.00 05/13/2020   LDLDIRECT 101.0 10/07/2018   LDLCALC 80 05/13/2020   ALT 13 05/13/2020   AST 16 05/13/2020   NA 132 (L) 12/15/2020   K 4.1 12/15/2020   CL 100 12/15/2020   CREATININE 0.96 12/15/2020   BUN 13 12/15/2020   CO2 28 12/15/2020   TSH 53.57 (H) 12/15/2020   INR 1.11 06/19/2017   HGBA1C 5.5 06/20/2017    CT ANGIO HEAD W OR WO CONTRAST  Result Date: 09/17/2020 CLINICAL DATA:  [facility] CT head: EXAM: CT ANGIOGRAPHY HEAD AND NECK TECHNIQUE: Multidetector CT imaging of the head and neck was performed using the standard protocol during bolus administration of intravenous contrast. Multiplanar CT image reconstructions and MIPs were obtained to evaluate the vascular anatomy. Carotid stenosis measurements (when applicable) are obtained utilizing NASCET criteria, using the distal internal carotid diameter as the denominator. CONTRAST:  7mL ISOVUE-370 IOPAMIDOL (ISOVUE-370) INJECTION 76% COMPARISON:  Jun 09, 2017. FINDINGS: CT HEAD FINDINGS Brain: No evidence of acute infarction, hemorrhage, hydrocephalus, extra-axial collection or mass lesion/mass effect. Vascular: See below. Skull: No acute fracture. Sinuses: Clear sinuses.  No acute orbital finding. Orbits: No sizable mastoid effusions. Review of the MIP images confirms the above findings CTA NECK FINDINGS Aortic arch: Atherosclerosis.  Great vessel origins are patent. Right carotid system: Moderate calcific and noncalcific atherosclerosis at the posterior carotid bifurcation without greater than 50% stenosis. Left carotid system: Mild atherosclerosis at the carotid bifurcation without greater than 50% stenosis. Vertebral arteries: Left dominant. No evidence of dissection, stenosis (50% or greater) or occlusion. Skeleton: Reversal of the normal cervical lordosis with anterolisthesis  of C3 on C4 and C4 on C5. Moderate to severe left eccentric degenerative disc disease at C5-C6 and C6-C7 with disc height loss, endplate sclerosis and endplate spurring. Dextrocurvature of the lower cervical spine. Other neck: No acute abnormality. Upper chest: Biapical pleuroparenchymal scarring. Otherwise, visualized lung apices are clear. Calcified mediastinal lymph nodes, likely the sequela of prior granulomatous infection. Partially imaged cardiac rhythm maintenance device and median sternotomy. Review of the MIP images confirms the above findings CTA HEAD FINDINGS Anterior circulation: Bilateral intracranial ICAs, MCAs, and ACAs are patent without proximal hemodynamically significant stenosis. Similar mild bilateral cavernous and supraclinoid dolichoectasia and mild calcified atherosclerosis. Similar fenestrated anterior communicating artery, anatomic variant. No aneurysm identified. Posterior circulation: Bilateral intradural vertebral arteries, basilar artery, and posterior cerebral arteries are patent without proximal hemodynamically significant stenosis. Bilateral posterior communicating arteries with small  P1 PCAs bilaterally, anatomic variant. No aneurysm identified. Venous sinuses: As permitted by contrast timing, patent. Anatomic variants: Detailed above. Review of the MIP images confirms the above findings IMPRESSION: CT head: No evidence of acute intracranial abnormality. CTA head: 1. No large vessel occlusion, proximal hemodynamically significant stenosis, or aneurysm. 2. Similar mild bilateral intracranial ICA dolichoectasia. CTA neck: Similar right carotid bifurcation atherosclerosis without greater than 50% stenosis. Electronically Signed   By: Margaretha Sheffield M.D.   On: 09/17/2020 08:28   CT ANGIO NECK W OR WO CONTRAST  Result Date: 09/17/2020 CLINICAL DATA:  [place] CT head: EXAM: CT ANGIOGRAPHY HEAD AND NECK TECHNIQUE: Multidetector CT imaging of the head and neck was performed using  the standard protocol during bolus administration of intravenous contrast. Multiplanar CT image reconstructions and MIPs were obtained to evaluate the vascular anatomy. Carotid stenosis measurements (when applicable) are obtained utilizing NASCET criteria, using the distal internal carotid diameter as the denominator. CONTRAST:  49mL ISOVUE-370 IOPAMIDOL (ISOVUE-370) INJECTION 76% COMPARISON:  Jun 09, 2017. FINDINGS: CT HEAD FINDINGS Brain: No evidence of acute infarction, hemorrhage, hydrocephalus, extra-axial collection or mass lesion/mass effect. Vascular: See below. Skull: No acute fracture. Sinuses: Clear sinuses.  No acute orbital finding. Orbits: No sizable mastoid effusions. Review of the MIP images confirms the above findings CTA NECK FINDINGS Aortic arch: Atherosclerosis.  Great vessel origins are patent. Right carotid system: Moderate calcific and noncalcific atherosclerosis at the posterior carotid bifurcation without greater than 50% stenosis. Left carotid system: Mild atherosclerosis at the carotid bifurcation without greater than 50% stenosis. Vertebral arteries: Left dominant. No evidence of dissection, stenosis (50% or greater) or occlusion. Skeleton: Reversal of the normal cervical lordosis with anterolisthesis of C3 on C4 and C4 on C5. Moderate to severe left eccentric degenerative disc disease at C5-C6 and C6-C7 with disc height loss, endplate sclerosis and endplate spurring. Dextrocurvature of the lower cervical spine. Other neck: No acute abnormality. Upper chest: Biapical pleuroparenchymal scarring. Otherwise, visualized lung apices are clear. Calcified mediastinal lymph nodes, likely the sequela of prior granulomatous infection. Partially imaged cardiac rhythm maintenance device and median sternotomy. Review of the MIP images confirms the above findings CTA HEAD FINDINGS Anterior circulation: Bilateral intracranial ICAs, MCAs, and ACAs are patent without proximal hemodynamically significant  stenosis. Similar mild bilateral cavernous and supraclinoid dolichoectasia and mild calcified atherosclerosis. Similar fenestrated anterior communicating artery, anatomic variant. No aneurysm identified. Posterior circulation: Bilateral intradural vertebral arteries, basilar artery, and posterior cerebral arteries are patent without proximal hemodynamically significant stenosis. Bilateral posterior communicating arteries with small P1 PCAs bilaterally, anatomic variant. No aneurysm identified. Venous sinuses: As permitted by contrast timing, patent. Anatomic variants: Detailed above. Review of the MIP images confirms the above findings IMPRESSION: CT head: No evidence of acute intracranial abnormality. CTA head: 1. No large vessel occlusion, proximal hemodynamically significant stenosis, or aneurysm. 2. Similar mild bilateral intracranial ICA dolichoectasia. CTA neck: Similar right carotid bifurcation atherosclerosis without greater than 50% stenosis. Electronically Signed   By: Margaretha Sheffield M.D.   On: 09/17/2020 08:28    Assessment & Plan:   Amy Lane was seen today for hypothyroidism.  Diagnoses and all orders for this visit:  Acquired hypothyroidism- Her TSH is up to 54.  Will double her dose of T4. -     TSH; Future -     TSH -     levothyroxine (SYNTHROID) 100 MCG tablet; Take 1 tablet (100 mcg total) by mouth daily before breakfast.  Hyponatremia- Her serum sodium is stable.  Her  urinary sodium is in the low normal range.  This is not consistent with SIADH.  I am concerned there may be some component of psychogenic polydipsia.  She will decrease her water intake by 50%. -     Basic metabolic panel; Future -     Cortisol; Future -     Sodium, urine, random; Future -     Sodium, urine, random -     Cortisol -     Basic metabolic panel  Visit for screening mammogram -     MM DIGITAL SCREENING BILATERAL; Future  I have discontinued Kamron T. Kovacevic's finasteride and levothyroxine. I  am also having her start on levothyroxine. Additionally, I am having her maintain her acebutolol, LORazepam, citalopram, ezetimibe, topiramate, and Eliquis.  Meds ordered this encounter  Medications   levothyroxine (SYNTHROID) 100 MCG tablet    Sig: Take 1 tablet (100 mcg total) by mouth daily before breakfast.    Dispense:  90 tablet    Refill:  0      Follow-up: Return in about 6 months (around 06/14/2021).  Scarlette Calico, MD

## 2020-12-15 NOTE — Patient Instructions (Signed)

## 2020-12-16 LAB — SODIUM, URINE, RANDOM: Sodium, Ur: 36 mmol/L (ref 28–272)

## 2021-01-11 DIAGNOSIS — M2242 Chondromalacia patellae, left knee: Secondary | ICD-10-CM | POA: Diagnosis not present

## 2021-01-28 ENCOUNTER — Ambulatory Visit (INDEPENDENT_AMBULATORY_CARE_PROVIDER_SITE_OTHER): Payer: Medicare Other | Admitting: Internal Medicine

## 2021-01-28 ENCOUNTER — Other Ambulatory Visit: Payer: Self-pay

## 2021-01-28 ENCOUNTER — Encounter: Payer: Self-pay | Admitting: Internal Medicine

## 2021-01-28 ENCOUNTER — Ambulatory Visit: Payer: Medicare Other

## 2021-01-28 DIAGNOSIS — J0110 Acute frontal sinusitis, unspecified: Secondary | ICD-10-CM | POA: Diagnosis not present

## 2021-01-28 DIAGNOSIS — J329 Chronic sinusitis, unspecified: Secondary | ICD-10-CM | POA: Insufficient documentation

## 2021-01-28 MED ORDER — AMOXICILLIN-POT CLAVULANATE 875-125 MG PO TABS: 1.0000 | ORAL_TABLET | Freq: Two times a day (BID) | ORAL | 0 refills | Status: AC

## 2021-01-28 NOTE — Assessment & Plan Note (Signed)
Rx augmentin for >2 weeks of sinus symptoms without resolution. Advised to start zyrtec or claritin otc as well.

## 2021-01-28 NOTE — Progress Notes (Signed)
° °  Subjective:   Patient ID: Amy Lane, female    DOB: 06/18/1950, 71 y.o.   MRN: [id number]  HPI The patient is a 71 YO female coming in for sinus symptoms going on >2 weeks.   Review of Systems  Constitutional:  Positive for activity change and appetite change. Negative for chills, fatigue, fever and unexpected weight change.  HENT:  Positive for congestion, postnasal drip, rhinorrhea and sinus pressure. Negative for ear discharge, ear pain, sinus pain, sneezing, sore throat, tinnitus, trouble swallowing and voice change.   Eyes: Negative.   Respiratory:  Negative for cough, chest tightness, shortness of breath and wheezing.   Cardiovascular: Negative.   Gastrointestinal: Negative.   Musculoskeletal:  Positive for myalgias.  Neurological: Negative.    Objective:  Physical Exam Constitutional:      Appearance: She is well-developed.  HENT:     Head: Normocephalic and atraumatic.     Comments: Oropharynx with redness and clear drainage, nose with swollen turbinates, TMs normal bilaterally.  Neck:     Thyroid: No thyromegaly.  Cardiovascular:     Rate and Rhythm: Normal rate and regular rhythm.  Pulmonary:     Effort: Pulmonary effort is normal. No respiratory distress.     Breath sounds: Normal breath sounds. No wheezing or rales.  Abdominal:     General: Bowel sounds are normal. There is no distension.     Palpations: Abdomen is soft.     Tenderness: There is no abdominal tenderness. There is no rebound.  Musculoskeletal:        General: No tenderness.     Cervical back: Normal range of motion.  Lymphadenopathy:     Cervical: No cervical adenopathy.  Skin:    General: Skin is warm and dry.  Neurological:     Mental Status: She is alert and oriented to person, place, and time.     Coordination: Coordination normal.    Vitals:   01/28/21 1110  BP: 128/70  Pulse: 75  Resp: 18  SpO2: 97%  Weight: 133 lb 6.4 oz (60.5 kg)  Height: 5' 10.5" (1.791 m)    This  visit occurred during the SARS-CoV-2 public health emergency.  Safety protocols were in place, including screening questions prior to the visit, additional usage of staff PPE, and extensive cleaning of exam room while observing appropriate contact time as indicated for disinfecting solutions.   Assessment & Plan:

## 2021-01-28 NOTE — Patient Instructions (Signed)
We have sent in augmentin to take 1 pill twice a day for 1 week to clear the sinuses.   It is okay to take claritin to help with the drainage for the next 1-2 weeks

## 2021-02-01 ENCOUNTER — Other Ambulatory Visit: Payer: Self-pay | Admitting: Internal Medicine

## 2021-02-01 DIAGNOSIS — F411 Generalized anxiety disorder: Secondary | ICD-10-CM

## 2021-02-03 ENCOUNTER — Ambulatory Visit (INDEPENDENT_AMBULATORY_CARE_PROVIDER_SITE_OTHER): Payer: Medicare Other

## 2021-02-03 DIAGNOSIS — I495 Sick sinus syndrome: Secondary | ICD-10-CM | POA: Diagnosis not present

## 2021-02-04 LAB — CUP PACEART REMOTE DEVICE CHECK
Battery Impedance: 1897 Ohm
Battery Remaining Longevity: 37 mo
Battery Voltage: 2.76 V
Brady Statistic AP VP Percent: 0 %
Brady Statistic AP VS Percent: 83 %
Brady Statistic AS VP Percent: 0 %
Brady Statistic AS VS Percent: 16 %
Date Time Interrogation Session: 20230113110600
Implantable Pulse Generator Implant Date: 20110310
Lead Channel Impedance Value: 404 Ohm
Lead Channel Impedance Value: 653 Ohm
Lead Channel Pacing Threshold Amplitude: 0.875 V
Lead Channel Pacing Threshold Amplitude: 1 V
Lead Channel Pacing Threshold Pulse Width: 0.4 ms
Lead Channel Pacing Threshold Pulse Width: 0.4 ms
Lead Channel Setting Pacing Amplitude: 2 V
Lead Channel Setting Pacing Amplitude: 2.5 V
Lead Channel Setting Pacing Pulse Width: 0.4 ms
Lead Channel Setting Sensing Sensitivity: 2.8 mV

## 2021-02-08 ENCOUNTER — Ambulatory Visit (INDEPENDENT_AMBULATORY_CARE_PROVIDER_SITE_OTHER): Payer: Medicare Other

## 2021-02-08 ENCOUNTER — Other Ambulatory Visit: Payer: Self-pay

## 2021-02-08 VITALS — BP 110/70 | HR 58 | Temp 97.4°F | Ht 71.0 in | Wt 133.0 lb

## 2021-02-08 DIAGNOSIS — Z0000 Encounter for general adult medical examination without abnormal findings: Secondary | ICD-10-CM | POA: Diagnosis not present

## 2021-02-08 NOTE — Patient Instructions (Signed)
[name] , Thank you for taking time to come for your Medicare Wellness Visit. I appreciate your ongoing commitment to your health goals. Please review the following plan we discussed and let me know if I can assist you in the future.   Screening recommendations/referrals: Colonoscopy: 01/04/2018; due every 10 years Mammogram: 09/24/2019; scheduled for 02/16/2021 Bone Density: 01/02/2019; due every 2-3 years  Recommended yearly ophthalmology/optometry visit for glaucoma screening and checkup Recommended yearly dental visit for hygiene and checkup  Vaccinations: Influenza vaccine: 11/13/2020 Pneumococcal vaccine: 05/31/2015, 01/07/2018 Tdap vaccine: 05/13/2020; due every 10 years Shingles vaccine: never done   Covid-19: 02/28/2019, 03/26/2019, 11/04/2019  Advanced directives: Please bring a copy of your health care power of attorney and living will to the office at your convenience.  Conditions/risks identified: Yes; Client understands the importance of follow-up with providers by attending scheduled visits and discussed goals to eat healthier, increase physical activity, exercise the brain, socialize more, get enough sleep and make time for laughter.  Next appointment: Please schedule your next Medicare Wellness Visit with your Nurse Health Advisor in 1 year by calling [phone].   Preventive Care 69 Years and Older, Female Preventive care refers to lifestyle choices and visits with your health care provider that can promote health and wellness. What does preventive care include? A yearly physical exam. This is also called an annual well check. Dental exams once or twice a year. Routine eye exams. Ask your health care provider how often you should have your eyes checked. Personal lifestyle choices, including: Daily care of your teeth and gums. Regular physical activity. Eating a healthy diet. Avoiding tobacco and drug use. Limiting alcohol use. Practicing safe sex. Taking low-dose  aspirin every day. Taking vitamin and mineral supplements as recommended by your health care provider. What happens during an annual well check? The services and screenings done by your health care provider during your annual well check will depend on your age, overall health, lifestyle risk factors, and family history of disease. Counseling  Your health care provider may ask you questions about your: Alcohol use. Tobacco use. Drug use. Emotional well-being. Home and relationship well-being. Sexual activity. Eating habits. History of falls. Memory and ability to understand (cognition). Work and work Statistician. Reproductive health. Screening  You may have the following tests or measurements: Height, weight, and BMI. Blood pressure. Lipid and cholesterol levels. These may be checked every 5 years, or more frequently if you are over 17 years old. Skin check. Lung cancer screening. You may have this screening every year starting at age 5 if you have a 30-pack-year history of smoking and currently smoke or have quit within the past 15 years. Fecal occult blood test (FOBT) of the stool. You may have this test every year starting at age 88. Flexible sigmoidoscopy or colonoscopy. You may have a sigmoidoscopy every 5 years or a colonoscopy every 10 years starting at age 20. Hepatitis C blood test. Hepatitis B blood test. Sexually transmitted disease (STD) testing. Diabetes screening. This is done by checking your blood sugar (glucose) after you have not eaten for a while (fasting). You may have this done every 1-3 years. Bone density scan. This is done to screen for osteoporosis. You may have this done starting at age 45. Mammogram. This may be done every 1-2 years. Talk to your health care provider about how often you should have regular mammograms. Talk with your health care provider about your test results, treatment options, and if necessary, the need for more  tests. Vaccines  Your  health care provider may recommend certain vaccines, such as: Influenza vaccine. This is recommended every year. Tetanus, diphtheria, and acellular pertussis (Tdap, Td) vaccine. You may need a Td booster every 10 years. Zoster vaccine. You may need this after age 79. Pneumococcal 13-valent conjugate (PCV13) vaccine. One dose is recommended after age 66. Pneumococcal polysaccharide (PPSV23) vaccine. One dose is recommended after age 34. Talk to your health care provider about which screenings and vaccines you need and how often you need them. This information is not intended to replace advice given to you by your health care provider. Make sure you discuss any questions you have with your health care provider. Document Released: 02/05/2015 Document Revised: 09/29/2015 Document Reviewed: 11/10/2014 Elsevier Interactive Patient Education  2017 [place] Prevention in the Home Falls can cause injuries. They can happen to people of all ages. There are many things you can do to make your home safe and to help prevent falls. What can I do on the outside of my home? Regularly fix the edges of walkways and driveways and fix any cracks. Remove anything that might make you trip as you walk through a door, such as a raised step or threshold. Trim any bushes or trees on the path to your home. Use bright outdoor lighting. Clear any walking paths of anything that might make someone trip, such as rocks or tools. Regularly check to see if handrails are loose or broken. Make sure that both sides of any steps have handrails. Any raised decks and porches should have guardrails on the edges. Have any leaves, snow, or ice cleared regularly. Use sand or salt on walking paths during winter. Clean up any spills in your garage right away. This includes oil or grease spills. What can I do in the bathroom? Use night lights. Install grab bars by the toilet and in the tub and shower. Do not use towel bars as  grab bars. Use non-skid mats or decals in the tub or shower. If you need to sit down in the shower, use a plastic, non-slip stool. Keep the floor dry. Clean up any water that spills on the floor as soon as it happens. Remove soap buildup in the tub or shower regularly. Attach bath mats securely with double-sided non-slip rug tape. Do not have throw rugs and other things on the floor that can make you trip. What can I do in the bedroom? Use night lights. Make sure that you have a light by your bed that is easy to reach. Do not use any sheets or blankets that are too big for your bed. They should not hang down onto the floor. Have a firm chair that has side arms. You can use this for support while you get dressed. Do not have throw rugs and other things on the floor that can make you trip. What can I do in the kitchen? Clean up any spills right away. Avoid walking on wet floors. Keep items that you use a lot in easy-to-reach places. If you need to reach something above you, use a strong step stool that has a grab bar. Keep electrical cords out of the way. Do not use floor polish or wax that makes floors slippery. If you must use wax, use non-skid floor wax. Do not have throw rugs and other things on the floor that can make you trip. What can I do with my stairs? Do not leave any items on the stairs. Make sure  that there are handrails on both sides of the stairs and use them. Fix handrails that are broken or loose. Make sure that handrails are as long as the stairways. Check any carpeting to make sure that it is firmly attached to the stairs. Fix any carpet that is loose or worn. Avoid having throw rugs at the top or bottom of the stairs. If you do have throw rugs, attach them to the floor with carpet tape. Make sure that you have a light switch at the top of the stairs and the bottom of the stairs. If you do not have them, ask someone to add them for you. What else can I do to help prevent  falls? Wear shoes that: Do not have high heels. Have rubber bottoms. Are comfortable and fit you well. Are closed at the toe. Do not wear sandals. If you use a stepladder: Make sure that it is fully opened. Do not climb a closed stepladder. Make sure that both sides of the stepladder are locked into place. Ask someone to hold it for you, if possible. Clearly mark and make sure that you can see: Any grab bars or handrails. First and last steps. Where the edge of each step is. Use tools that help you move around (mobility aids) if they are needed. These include: Canes. Walkers. Scooters. Crutches. Turn on the lights when you go into a dark area. Replace any light bulbs as soon as they burn out. Set up your furniture so you have a clear path. Avoid moving your furniture around. If any of your floors are uneven, fix them. If there are any pets around you, be aware of where they are. Review your medicines with your doctor. Some medicines can make you feel dizzy. This can increase your chance of falling. Ask your doctor what other things that you can do to help prevent falls. This information is not intended to replace advice given to you by your health care provider. Make sure you discuss any questions you have with your health care provider. Document Released: 11/05/2008 Document Revised: 06/17/2015 Document Reviewed: 02/13/2014 Elsevier Interactive Patient Education  2017 [employer].

## 2021-02-08 NOTE — Progress Notes (Signed)
Subjective:   Amy Lane is a 71 y.o. female who presents for Medicare Annual (Subsequent) preventive examination.  Review of Systems     Cardiac Risk Factors include: advanced age (>66men, >4 women);dyslipidemia;family history of premature cardiovascular disease     Objective:    Today's Vitals   02/08/21 1300  BP: 110/70  Pulse: (!) 58  Temp: (!) 97.4 F (36.3 C)  SpO2: 97%  Weight: 133 lb (60.3 kg)  Height: 5\' 11"  (1.803 m)  PainSc: 0-No pain   Body mass index is 18.55 kg/m.  Advanced Directives 02/08/2021 12/09/2019 01/03/2019 12/04/2018 04/27/2016 12/11/2014 04/06/2014  Does Patient Have a Medical Advance Directive? Yes Yes No Yes No Yes Yes  Type of Advance Directive Living will;Healthcare Power of [place];Living will - [place];Living will - Healthcare Power of [place]  Does patient want to make changes to medical advance directive? No - Patient declined - - No - Patient declined - - -  Copy of [place] in Chart? No - copy requested No - copy requested - No - copy requested - - -  Would patient like information on creating a medical advance directive? - - - - No - Patient declined - -    Current Medications (verified) Outpatient Encounter Medications as of 02/08/2021  Medication Sig   acebutolol (SECTRAL) 200 MG capsule TAKE 1 CAPSULE BY MOUTH AT BEDTIME.   citalopram (CELEXA) 20 MG tablet TAKE 1 TABLET BY MOUTH EVERY DAY   ELIQUIS 5 MG TABS tablet TAKE 1 TABLET BY MOUTH TWICE A DAY   ezetimibe (ZETIA) 10 MG tablet TAKE 1 TABLET BY MOUTH DAILY. PLEASE KEEP UPCOMING APPT IN FEB WITH DR. Johnsie Cancel BEFORE ANYMORE REFILL   levothyroxine (SYNTHROID) 100 MCG tablet Take 1 tablet (100 mcg total) by mouth daily before breakfast.   LORazepam (ATIVAN) 1 MG tablet Take 1 tablet (1 mg total) by mouth every 8 (eight) hours as needed for anxiety.   [DISCONTINUED] topiramate  (TOPAMAX) 25 MG tablet TAKE 2 TABLETS BY MOUTH AT BEDTIME   No facility-administered encounter medications on file as of 02/08/2021.    Allergies (verified) Other, Statins, Adhesive [tape], and Latex   History: Past Medical History:  Diagnosis Date   Abnormal gait    Allergic rhinitis    Anxiety    Atrial fibrillation (HCC)    Carcinomas, basal cell    Cardiac pacemaker    DDD  MDT   Cerebrovascular disease    Clotting disorder ([place])    DVT   Depression    Dysautonomia ([place])    Ehler's-Danlos syndrome    Fibromyalgia    Headache(784.0)    Migraine   Heart murmur    Hemorrhoids    Herpes zoster    History of syncope    History of tachycardia-bradycardia syndrome    History of transient ischemic attack    Hypercholesteremia    Hypercholesterolemia    Hypothyroidism    Long term (current) use of anticoagulants    Migraine with aura 06/21/2017   Episodes of aphasia and confusion   Mitral valve prolapse    PAT (paroxysmal atrial tachycardia) (HCC)    PSVT (paroxysmal supraventricular tachycardia) (HCC)    Skin desquamation    inflammative vaginitis   Stroke [facility])    SVT (supraventricular tachycardia) ([place])    Past Surgical History:  Procedure Laterality Date   ABLATION     x 2  biopsy     gum   CAROTID ENDARTERECTOMY     complex mitral valve repair     at the [place] / [facility] / [place]     left carotid endarterectomy  11/2003   by Dr. Delton See   PACEMAKER INSERTION     medtronic kappa 901   removed chalazion from left eye lid  09/2011   Dr. Satira Sark   Family History  Problem Relation Age of Onset   Lung cancer Mother    Prostate cancer Father    Alzheimer's disease Father    Heart disease Father    Ehlers-Danlos syndrome Father    Mitral valve prolapse Brother    Diabetes Sister    Mitral valve prolapse Sister    Leukemia Other        Nephew   Heart disease Other        entire maternal side of family    Cancer Other        entire maternal side of family   Social History   Socioeconomic History   Marital status: Married    Spouse name: Fritz Pickerel   Number of children: 0   Years of education: S-College   Highest education level: Not on file  Occupational History   Occupation: retired    Fish farm manager: RETIRED  Tobacco Use   Smoking status: Never   Smokeless tobacco: Never   Tobacco comments:    smoked in her early 20's--smoked 4 cigs per year   Vaping Use   Vaping Use: Never used  Substance and Sexual Activity   Alcohol use: No    Alcohol/week: 0.0 standard drinks    Comment: 2 drinks per year   Drug use: No   Sexual activity: Not on file  Other Topics Concern   Not on file  Social History Narrative   Lives with spouse   Patient drinks about 2 cups of caffeine daily.   Patient is right handed.    Social Determinants of Health   Financial Resource Strain: Low Risk    Difficulty of Paying Living Expenses: Not hard at all  Food Insecurity: No Food Insecurity   Worried About Charity fundraiser in the Last Year: Never true   [place] in the Last Year: Never true  Transportation Needs: No Transportation Needs   Lack of Transportation (Medical): No   Lack of Transportation (Non-Medical): No  Physical Activity: Inactive   Days of Exercise per Week: 0 days   Minutes of Exercise per Session: 0 min  Stress: No Stress Concern Present   Feeling of Stress : Not at all  Social Connections: Socially Integrated   Frequency of Communication with Friends and Family: More than three times a week   Frequency of Social Gatherings with Friends and Family: More than three times a week   Attends Religious Services: More than 4 times per year   Active Member of [employer] or Organizations: No   Attends Music therapist: More than 4 times per year   Marital Status: Married    Tobacco Counseling Counseling given: Not Answered Tobacco comments: smoked in her early 20's--smoked 4  cigs per year    Clinical Intake:  Pre-visit preparation completed: Yes  Pain : No/denies pain Pain Score: 0-No pain     BMI - recorded: 18.55 Nutritional Status: BMI <19  Underweight Nutritional Risks: None Diabetes: No  How often do you need  to have someone help you when you read instructions, pamphlets, or other written materials from your doctor or pharmacy?: 1 - Never What is the last grade level you completed in school?: College Courses; No degree  Diabetic? no  Interpreter Needed?: No  Information entered by :: Lisette Abu, LPN   Activities of Daily Living In your present state of health, do you have any difficulty performing the following activities: 02/08/2021 12/15/2020  Hearing? N N  Vision? N N  Difficulty concentrating or making decisions? N N  Walking or climbing stairs? N N  Dressing or bathing? N N  Doing errands, shopping? N N  Preparing Food and eating ? N -  Using the Toilet? N -  In the past six months, have you accidently leaked urine? N -  Do you have problems with loss of bowel control? N -  Managing your Medications? N -  Managing your Finances? N -  Housekeeping or managing your Housekeeping? N -  Some recent data might be hidden    Patient Care Team: Janith Lima, MD as PCP - General (Internal Medicine) Josue Hector, MD as PCP - Cardiology (Cardiology) Deboraha Sprang, MD as Consulting Physician (Cardiology) Dingeldein, Remo Lipps, MD (Ophthalmology) Key, Nelia Shi, NP as Nurse Practitioner (Gynecology) Kathrynn Ducking, MD (Inactive) as Consulting Physician (Neurology)  Indicate any recent Medical Services you may have received from other than [facility] providers in the past year (date may be approximate).     Assessment:   This is a routine wellness examination for Amy Lane.  Hearing/Vision screen Hearing Screening - Comments:: Patient denied any hearing difficulty.   No hearing aids.  Vision Screening - Comments:: Patient  wears corrective glasses/contacts.  Eye exam done annually by: Dr. Remo Lipps Dingledein  Dietary issues and exercise activities discussed: Current Exercise Habits: The patient does not participate in regular exercise at present (Patient will start physical therapy for left knee pain 02/23/2021), Exercise limited by: orthopedic condition(s)   Goals Addressed               This Visit's Progress     Patient Stated (pt-stated)        My goal is to get my thyroid numbers under control because now I'm dealing with alopecia.  Also I will continue to eat healthy and drink plenty of water.      Depression Screen PHQ 2/9 Scores 02/08/2021 12/09/2019 02/17/2019 12/04/2018 06/30/2016 05/01/2016  PHQ - 2 Score 0 0 0 0 0 0    Fall Risk Fall Risk  02/08/2021 11/15/2020 12/09/2019 02/17/2019 12/04/2018  Falls in the past year? 0 0 0 0 1  Number falls in past yr: 0 - 0 - 0  Injury with Fall? 0 - 0 - 0  Comment - - - - -  Risk Factor Category  - - - - -  Risk for fall due to : No Fall Risks - - - Impaired balance/gait  Risk for fall due to: Comment - - - - -  Follow up Falls evaluation completed - Falls prevention discussed - Education provided;Falls prevention discussed    FALL RISK PREVENTION PERTAINING TO THE HOME:  Any stairs in or around the home? Yes  If so, are there any without handrails? No  Home free of loose throw rugs in walkways, pet beds, electrical cords, etc? Yes  Adequate lighting in your home to reduce risk of falls? Yes   ASSISTIVE DEVICES UTILIZED TO PREVENT FALLS:  Life alert? No  Use of a cane, walker or w/c? No  Grab bars in the bathroom? Yes  Shower chair or bench in shower? No  Elevated toilet seat or a handicapped toilet? Yes   TIMED UP AND GO:  Was the test performed? Yes .  Length of time to ambulate 10 feet: 6 sec.   Gait steady and fast without use of assistive device  Cognitive Function: Normal cognitive status assessed by direct observation by this Nurse  Health Advisor. No abnormalities found.   MMSE - Mini Mental State Exam 06/21/2017 06/20/2016 12/23/2015 06/15/2015 12/11/2014  Orientation to time 5 4 4 5 4   Orientation to Place 5 5 5 5 5   Registration 3 3 3 3 3   Attention/ Calculation 4 5 5 5 5   Recall 3 2 2 2 3   Language- name 2 objects 2 2 2 2 2   Language- repeat 1 1 1 1 1   Language- follow 3 step command 3 3 3 3 3   Language- read & follow direction 1 1 1 1 1   Write a sentence 1 1 1 1 1   Copy design 1 1 1 1 1   Total score 29 28 28 29 29         Immunizations Immunization History  Administered Date(s) Administered   Fluad Quad(high Dose 65+) 10/07/2018, 12/16/2019, 11/13/2020   Influenza Split 11/21/2010, 11/21/2011   Influenza Whole 02/14/2007, 10/16/2007, 10/13/2009   Influenza, High Dose Seasonal PF 12/12/2017   Influenza,inj,Quad PF,6+ Mos 11/19/2012, 10/06/2013, 11/30/2014, 11/29/2015, 10/03/2016   PFIZER(Purple Top)SARS-COV-2 Vaccination 02/28/2019, 03/26/2019, 11/04/2019   Pneumococcal Conjugate-13 05/31/2015   Pneumococcal Polysaccharide-23 01/24/2003, 01/07/2018   Td 04/16/2009   Tdap 05/13/2020    TDAP status: Up to date  Flu Vaccine status: Up to date  Pneumococcal vaccine status: Up to date  Covid-19 vaccine status: Completed vaccines  Qualifies for Shingles Vaccine? Yes   Zostavax completed No   Shingrix Completed?: No.    Education has been provided regarding the importance of this vaccine. Patient has been advised to call insurance company to determine out of pocket expense if they have not yet received this vaccine. Advised may also receive vaccine at local pharmacy or Health Dept. Verbalized acceptance and understanding.  Screening Tests Health Maintenance  Topic Date Due   Zoster Vaccines- Shingrix (1 of 2) Never done   COVID-19 Vaccine (4 - Booster for Pfizer series) 12/30/2019   MAMMOGRAM  09/23/2021   COLONOSCOPY (Pts 45-76yrs Insurance coverage will need to be confirmed)  01/05/2028    TETANUS/TDAP  05/14/2030   Pneumonia Vaccine 21+ Years old  Completed   INFLUENZA VACCINE  Completed   DEXA SCAN  Completed   Hepatitis C Screening  Completed   HPV VACCINES  Aged Out    Health Maintenance  Health Maintenance Due  Topic Date Due   Zoster Vaccines- Shingrix (1 of 2) Never done   COVID-19 Vaccine (4 - Booster for Pfizer series) 12/30/2019    Colorectal cancer screening: Type of screening: Colonoscopy. Completed 01/04/2018. Repeat every 10 years  Mammogram status: Completed 09/24/2019. Repeat every year (Scheduled for 02/16/2021)  Bone Density status: Completed 01/02/2019. Results reflect: Bone density results: OSTEOPENIA. Repeat every 2-3 years.  Lung Cancer Screening: (Low Dose CT Chest recommended if Age 20-80 years, 30 pack-year currently smoking OR have quit w/in 15years.) does not qualify.   Lung Cancer Screening Referral: no  Additional Screening:  Hepatitis C Screening: does qualify; Completed yes  Vision Screening: Recommended annual ophthalmology exams for early detection of glaucoma  and other disorders of the eye. Is the patient up to date with their annual eye exam?  Yes  Who is the provider or what is the name of the office in which the patient attends annual eye exams? Rondall Allegra, MD. If pt is not established with a provider, would they like to be referred to a provider to establish care? No .   Dental Screening: Recommended annual dental exams for proper oral hygiene  Community Resource Referral / Chronic Care Management: CRR required this visit?  No   CCM required this visit?  No      Plan:     I have personally reviewed and noted the following in the patients chart:   Medical and social history Use of alcohol, tobacco or illicit drugs  Current medications and supplements including opioid prescriptions.  Functional ability and status Nutritional status Physical activity Advanced directives List of other  physicians Hospitalizations, surgeries, and ER visits in previous 12 months Vitals Screenings to include cognitive, depression, and falls Referrals and appointments  In addition, I have reviewed and discussed with patient certain preventive protocols, quality metrics, and best practice recommendations. A written personalized care plan for preventive services as well as general preventive health recommendations were provided to patient.     Sheral Flow, LPN   [id number]/98/[id number]   Nurse Notes:  Hearing Screening - Comments:: Patient denied any hearing difficulty.   No hearing aids.  Vision Screening - Comments:: Patient wears corrective glasses/contacts.  Eye exam done annually by: Dr. Remo Lipps Dingledein

## 2021-02-13 ENCOUNTER — Other Ambulatory Visit: Payer: Self-pay | Admitting: Internal Medicine

## 2021-02-13 DIAGNOSIS — E039 Hypothyroidism, unspecified: Secondary | ICD-10-CM

## 2021-02-14 NOTE — Progress Notes (Signed)
Patient ID: Amy Lane, female   DOB: 08-Apr-1950, 71 y.o.   MRN: [id number]     70 y.o. female history of bradycardia post pacer  Sees Dr Caryl Comes  status post generator replacement and lead repair March 2011. PAF quiescent She had history of POTS/Long QT and dysautonomia associated with Ehlers-Danlos syndrome. Post left CEA patent by duplex May 2019 MV repair at [facility] most recent echo 11/24/19 trivial MR no MS normal EF   Cognitive defect better on aricept  ? Of MS with LE weakness Unable to do MRI due to pacer Seen by Dr Jannifer Franklin Left visual field cut but  Carotids normal.   May in hospital with stroke/migraine like symptoms Confused and lost speech for 10 hours CT negative Rx migraine meds and to f/u with neurology Jannifer Franklin   Husband battling prostate cancer Increasingly less willing to travel with her   PPM normal function per EP with no PAF on device interrogation   Has been intolerant to statins with myalgias. LDL 80  05/13/20 on Zetia  Did not want to pursue PSK9   No cardiac complaints d  Thyroid is way off TSH 55 and synthroid dose increased Wants repeat labs today She has had a lot of hair falling out   ROS: Denies fever, malais, weight loss, blurry vision, decreased visual acuity, cough, sputum, SOB, hemoptysis, pleuritic pain, palpitaitons, heartburn, abdominal pain, melena, lower extremity edema, claudication, or rash.  All other systems reviewed and negative  General: BP 118/76    Pulse 85    Ht 5\' 11"  (1.803 m)    Wt 136 lb 9.6 oz (62 kg)    SpO2 96%    BMI 19.05 kg/m   Affect appropriate Healthy:  appears stated age 40: normal Neck supple with no adenopathy Post CEA  no thyromegaly Lungs clear with no wheezing and good diaphragmatic motion Heart:  S1/S2 no murmur, no rub, gallop or click PMI normal post right mini thoracotomy PPM under left clavicle  Abdomen: benighn, BS positve, no tenderness, no AAA no bruit.  No HSM or HJR Distal pulses intact with no  bruits No edema Neuro non-focal Skin warm and dry No muscular weakness   Current Outpatient Medications  Medication Sig Dispense Refill   acebutolol (SECTRAL) 200 MG capsule TAKE 1 CAPSULE BY MOUTH AT BEDTIME. 90 capsule 3   citalopram (CELEXA) 20 MG tablet TAKE 1 TABLET BY MOUTH EVERY DAY 90 tablet 1   ELIQUIS 5 MG TABS tablet TAKE 1 TABLET BY MOUTH TWICE A DAY 180 tablet 1   ezetimibe (ZETIA) 10 MG tablet TAKE 1 TABLET BY MOUTH DAILY. PLEASE KEEP UPCOMING APPT IN FEB WITH DR. Johnsie Cancel BEFORE ANYMORE REFILL 90 tablet 1   levothyroxine (SYNTHROID) 100 MCG tablet Take 1 tablet (100 mcg total) by mouth daily before breakfast. 90 tablet 0   LORazepam (ATIVAN) 1 MG tablet Take 1 tablet (1 mg total) by mouth every 8 (eight) hours as needed for anxiety. 90 tablet 3   No current facility-administered medications for this visit.    Allergies  Other, Statins, Adhesive [tape], and Latex  Electrocardiogram:  06/20/17  A pacing RBBB 03/12/19 SR rate 72 RBBB PAC   Assessment and Plan  Pacer:  Reviewed Dr Aquilla Hacker last note normal function normal remote transmission  ? When time Can it be changed out and be MRI compatible Normal PaCEART 02/04/21 reviewed   POTS:  Improved with no postural symptoms    TIA:  CEA patent by duplex  05/30/17 will update   Now on Eliquis with no bleeding issues ? Related to PAF Device with 0% PAF   MVrepair:  TTE 11/24/19 EF 55-60% trivial MR stable repair SBE prophylaxis   Thyroid:  Synthroid dose increased TSH markedly elevated 53 2 months ago Patient requests repeat labs today   Chol:  On Zetia LDL 100 intolerant to statins    Neuro:  Continue aricept for cognitive dysfunction F//U DR Jannifer Franklin Migraine with TIA like symptoms now on Topamax   Ortho:  Patellar degeneration post cortisone injection f/u [place] would not encourage surgery unless Pain gets worse   TSH/T4/T3 F/U with me in  6 months    [name]

## 2021-02-15 NOTE — Progress Notes (Signed)
Remote pacemaker transmission.   

## 2021-02-16 ENCOUNTER — Other Ambulatory Visit: Payer: Self-pay

## 2021-02-16 ENCOUNTER — Ambulatory Visit
Admission: RE | Admit: 2021-02-16 | Discharge: 2021-02-16 | Disposition: A | Discharge status: Home / Self Care | Payer: Medicare Other | Source: Ambulatory Visit | Attending: Internal Medicine | Admitting: Internal Medicine

## 2021-02-16 DIAGNOSIS — Z1231 Encounter for screening mammogram for malignant neoplasm of breast: Secondary | ICD-10-CM

## 2021-02-17 ENCOUNTER — Ambulatory Visit (INDEPENDENT_AMBULATORY_CARE_PROVIDER_SITE_OTHER): Payer: Medicare Other | Admitting: Cardiovascular Disease

## 2021-02-17 ENCOUNTER — Encounter: Payer: Self-pay | Admitting: Cardiovascular Disease

## 2021-02-17 VITALS — BP 118/76 | HR 85 | Ht 71.0 in | Wt 136.6 lb

## 2021-02-17 DIAGNOSIS — Z950 Presence of cardiac pacemaker: Secondary | ICD-10-CM

## 2021-02-17 DIAGNOSIS — I471 Supraventricular tachycardia: Secondary | ICD-10-CM | POA: Diagnosis not present

## 2021-02-17 DIAGNOSIS — G90A Postural orthostatic tachycardia syndrome (POTS): Secondary | ICD-10-CM | POA: Diagnosis not present

## 2021-02-17 DIAGNOSIS — E038 Other specified hypothyroidism: Secondary | ICD-10-CM

## 2021-02-17 DIAGNOSIS — Z9889 Other specified postprocedural states: Secondary | ICD-10-CM | POA: Diagnosis not present

## 2021-02-17 LAB — T3: T3, Total: 133 ng/dL (ref 71–180)

## 2021-02-17 LAB — TSH: TSH: 0.031 u[IU]/mL — ABNORMAL LOW (ref 0.450–4.500)

## 2021-02-17 LAB — T4: T4, Total: 10.5 ug/dL (ref 4.5–12.0)

## 2021-02-17 NOTE — Patient Instructions (Signed)
Medication Instructions:  NO CHANGES *If you need a refill on your cardiac medications before your next appointment, please call your pharmacy*   Lab Work: NONE If you have labs (blood work) drawn today and your tests are completely normal, you will receive your results only by: [place] (if you have MyChart) OR A paper copy in the mail If you have any lab test that is abnormal or we need to change your treatment, we will call you to review the results.   Testing/Procedures: NONE   Follow-Up: At [facility], you and your health needs are our priority.  As part of our continuing mission to provide you with exceptional heart care, we have created designated Provider Care Teams.  These Care Teams include your primary Cardiologist (physician) and Advanced Practice Providers (APPs -  Physician Assistants and Nurse Practitioners) who all work together to provide you with the care you need, when you need it.  We recommend signing up for the patient portal called "MyChart".  Sign up information is provided on this After Visit Summary.  MyChart is used to connect with patients for Virtual Visits (Telemedicine).  Patients are able to view lab/test results, encounter notes, upcoming appointments, etc.  Non-urgent messages can be sent to your provider as well.   To learn more about what you can do with MyChart, go to [email or web address].    Your next appointment:   1 year(s)  The format for your next appointment:   In Person  Provider:   [name], MD     Other Instructions NONE

## 2021-02-21 ENCOUNTER — Encounter: Payer: Self-pay | Admitting: Internal Medicine

## 2021-02-21 ENCOUNTER — Other Ambulatory Visit: Payer: Self-pay | Admitting: Internal Medicine

## 2021-02-21 DIAGNOSIS — E039 Hypothyroidism, unspecified: Secondary | ICD-10-CM

## 2021-02-21 MED ORDER — LEVOTHYROXINE SODIUM 75 MCG PO TABS: 75.0000 ug | ORAL_TABLET | Freq: Every day | ORAL | 0 refills | Status: DC

## 2021-02-28 ENCOUNTER — Ambulatory Visit: Payer: Medicare Other | Admitting: Cardiovascular Disease

## 2021-03-07 DIAGNOSIS — R2689 Other abnormalities of gait and mobility: Secondary | ICD-10-CM | POA: Diagnosis not present

## 2021-03-07 DIAGNOSIS — M6281 Muscle weakness (generalized): Secondary | ICD-10-CM | POA: Diagnosis not present

## 2021-03-07 DIAGNOSIS — M2242 Chondromalacia patellae, left knee: Secondary | ICD-10-CM | POA: Diagnosis not present

## 2021-03-09 DIAGNOSIS — M2242 Chondromalacia patellae, left knee: Secondary | ICD-10-CM | POA: Diagnosis not present

## 2021-03-09 DIAGNOSIS — M6281 Muscle weakness (generalized): Secondary | ICD-10-CM | POA: Diagnosis not present

## 2021-03-09 DIAGNOSIS — R2689 Other abnormalities of gait and mobility: Secondary | ICD-10-CM | POA: Diagnosis not present

## 2021-03-14 DIAGNOSIS — R2689 Other abnormalities of gait and mobility: Secondary | ICD-10-CM | POA: Diagnosis not present

## 2021-03-14 DIAGNOSIS — M2242 Chondromalacia patellae, left knee: Secondary | ICD-10-CM | POA: Diagnosis not present

## 2021-03-14 DIAGNOSIS — M6281 Muscle weakness (generalized): Secondary | ICD-10-CM | POA: Diagnosis not present

## 2021-03-16 DIAGNOSIS — M2242 Chondromalacia patellae, left knee: Secondary | ICD-10-CM | POA: Diagnosis not present

## 2021-03-16 DIAGNOSIS — R2689 Other abnormalities of gait and mobility: Secondary | ICD-10-CM | POA: Diagnosis not present

## 2021-03-16 DIAGNOSIS — M6281 Muscle weakness (generalized): Secondary | ICD-10-CM | POA: Diagnosis not present

## 2021-03-20 ENCOUNTER — Other Ambulatory Visit: Payer: Self-pay | Admitting: Internal Medicine

## 2021-03-20 DIAGNOSIS — E039 Hypothyroidism, unspecified: Secondary | ICD-10-CM

## 2021-03-23 ENCOUNTER — Other Ambulatory Visit: Payer: Self-pay | Admitting: Internal Medicine

## 2021-03-23 DIAGNOSIS — M6281 Muscle weakness (generalized): Secondary | ICD-10-CM | POA: Diagnosis not present

## 2021-03-23 DIAGNOSIS — M2242 Chondromalacia patellae, left knee: Secondary | ICD-10-CM | POA: Diagnosis not present

## 2021-03-23 DIAGNOSIS — F411 Generalized anxiety disorder: Secondary | ICD-10-CM

## 2021-03-23 DIAGNOSIS — R2689 Other abnormalities of gait and mobility: Secondary | ICD-10-CM | POA: Diagnosis not present

## 2021-03-28 DIAGNOSIS — R2689 Other abnormalities of gait and mobility: Secondary | ICD-10-CM | POA: Diagnosis not present

## 2021-03-28 DIAGNOSIS — M2242 Chondromalacia patellae, left knee: Secondary | ICD-10-CM | POA: Diagnosis not present

## 2021-03-28 DIAGNOSIS — M6281 Muscle weakness (generalized): Secondary | ICD-10-CM | POA: Diagnosis not present

## 2021-03-30 DIAGNOSIS — R2689 Other abnormalities of gait and mobility: Secondary | ICD-10-CM | POA: Diagnosis not present

## 2021-03-30 DIAGNOSIS — M2242 Chondromalacia patellae, left knee: Secondary | ICD-10-CM | POA: Diagnosis not present

## 2021-03-30 DIAGNOSIS — M6281 Muscle weakness (generalized): Secondary | ICD-10-CM | POA: Diagnosis not present

## 2021-04-04 DIAGNOSIS — M2242 Chondromalacia patellae, left knee: Secondary | ICD-10-CM | POA: Diagnosis not present

## 2021-04-04 DIAGNOSIS — R2689 Other abnormalities of gait and mobility: Secondary | ICD-10-CM | POA: Diagnosis not present

## 2021-04-04 DIAGNOSIS — M6281 Muscle weakness (generalized): Secondary | ICD-10-CM | POA: Diagnosis not present

## 2021-04-07 ENCOUNTER — Other Ambulatory Visit: Payer: Self-pay | Admitting: Cardiovascular Disease

## 2021-04-14 DIAGNOSIS — M2242 Chondromalacia patellae, left knee: Secondary | ICD-10-CM | POA: Diagnosis not present

## 2021-04-16 DIAGNOSIS — I4581 Long QT syndrome: Secondary | ICD-10-CM | POA: Diagnosis not present

## 2021-04-16 DIAGNOSIS — E785 Hyperlipidemia, unspecified: Secondary | ICD-10-CM | POA: Diagnosis not present

## 2021-04-16 DIAGNOSIS — Z79899 Other long term (current) drug therapy: Secondary | ICD-10-CM | POA: Diagnosis not present

## 2021-04-16 DIAGNOSIS — Q796 Ehlers-Danlos syndrome, unspecified: Secondary | ICD-10-CM | POA: Diagnosis not present

## 2021-04-16 DIAGNOSIS — E871 Hypo-osmolality and hyponatremia: Secondary | ICD-10-CM | POA: Diagnosis not present

## 2021-04-16 DIAGNOSIS — I639 Cerebral infarction, unspecified: Secondary | ICD-10-CM | POA: Diagnosis not present

## 2021-04-16 DIAGNOSIS — Z950 Presence of cardiac pacemaker: Secondary | ICD-10-CM | POA: Diagnosis not present

## 2021-04-16 DIAGNOSIS — I4891 Unspecified atrial fibrillation: Secondary | ICD-10-CM | POA: Diagnosis not present

## 2021-04-16 DIAGNOSIS — G43009 Migraine without aura, not intractable, without status migrainosus: Secondary | ICD-10-CM | POA: Diagnosis not present

## 2021-04-16 DIAGNOSIS — R2990 Unspecified symptoms and signs involving the nervous system: Secondary | ICD-10-CM | POA: Insufficient documentation

## 2021-04-16 DIAGNOSIS — M797 Fibromyalgia: Secondary | ICD-10-CM | POA: Diagnosis not present

## 2021-04-16 DIAGNOSIS — G459 Transient cerebral ischemic attack, unspecified: Secondary | ICD-10-CM | POA: Diagnosis not present

## 2021-04-16 DIAGNOSIS — I10 Essential (primary) hypertension: Secondary | ICD-10-CM | POA: Diagnosis not present

## 2021-04-16 DIAGNOSIS — E039 Hypothyroidism, unspecified: Secondary | ICD-10-CM | POA: Diagnosis not present

## 2021-04-16 DIAGNOSIS — Z7901 Long term (current) use of anticoagulants: Secondary | ICD-10-CM | POA: Diagnosis not present

## 2021-04-16 DIAGNOSIS — I495 Sick sinus syndrome: Secondary | ICD-10-CM | POA: Diagnosis not present

## 2021-04-16 DIAGNOSIS — Z8673 Personal history of transient ischemic attack (TIA), and cerebral infarction without residual deficits: Secondary | ICD-10-CM | POA: Diagnosis not present

## 2021-04-16 DIAGNOSIS — R29818 Other symptoms and signs involving the nervous system: Secondary | ICD-10-CM | POA: Diagnosis not present

## 2021-04-16 DIAGNOSIS — R410 Disorientation, unspecified: Secondary | ICD-10-CM | POA: Diagnosis not present

## 2021-04-17 DIAGNOSIS — R2990 Unspecified symptoms and signs involving the nervous system: Secondary | ICD-10-CM | POA: Diagnosis not present

## 2021-04-17 DIAGNOSIS — I639 Cerebral infarction, unspecified: Secondary | ICD-10-CM | POA: Diagnosis not present

## 2021-04-19 ENCOUNTER — Telehealth: Payer: Self-pay | Admitting: Neurology

## 2021-04-19 NOTE — Telephone Encounter (Signed)
Pt said had a stroke [place]. Notice a bill board sign and could not say the words. Husband was talking to me and notice I seem confused. He mention a name to me and I did not know what he was talking about. Husband pulled over to a rest area, ask for directions to the nearest hospital, was the next exit. Went to [facility] in [place], [place]. Was admitted to hospital for two days. The  hospital started running test, found that has had stroke like symptoms. Recommended call neurologist for a follow up. ?

## 2021-04-19 NOTE — Telephone Encounter (Signed)
Noted  

## 2021-04-20 ENCOUNTER — Ambulatory Visit (INDEPENDENT_AMBULATORY_CARE_PROVIDER_SITE_OTHER): Payer: Medicare Other | Admitting: Diagnostic Neuroimaging

## 2021-04-20 ENCOUNTER — Encounter: Payer: Self-pay | Admitting: Diagnostic Neuroimaging

## 2021-04-20 VITALS — BP 124/63 | HR 60 | Ht 70.5 in | Wt 137.0 lb

## 2021-04-20 DIAGNOSIS — G43109 Migraine with aura, not intractable, without status migrainosus: Secondary | ICD-10-CM | POA: Diagnosis not present

## 2021-04-20 DIAGNOSIS — G459 Transient cerebral ischemic attack, unspecified: Secondary | ICD-10-CM

## 2021-04-20 DIAGNOSIS — R410 Disorientation, unspecified: Secondary | ICD-10-CM

## 2021-04-20 NOTE — Patient Instructions (Signed)
TRANSIENT CONFUSION / APHASIA (also ~10 events of various neurologic events since age 71 years old; some TIA, some migraine; 1st event was a stroke) ?- ddx: complicated migraine vs TIA ?- continue eliquis, zetia ?- monitor for migraine in future; may consider topiramate if needed ?

## 2021-04-20 NOTE — Progress Notes (Signed)
? ?[facility] ? ?PATIENT: Amy Lane ?DOB: July 27, 1950 ? ?REFERRING CLINICIAN: Janith Lima, MD ?HISTORY FROM: patient  ?REASON FOR VISIT: new consult  ? ? ?HISTORICAL ? ?CHIEF COMPLAINT:  ?Chief Complaint  ?Patient presents with  ? Neurologic Problem  ?  Pt alone, rm 6, pt states she was riding with her husband. She had a moment of expressive aphasia. She struggled with getting words out all the way to [place]. She also had some confusion. She was taken to ER.   ? ? ?HISTORY OF PRESENT ILLNESS:  ? ?71 year old female here for evaluation of transient confusion.  History of Ehlers-Danlos, stroke, migraine. ? ?Patient had first stroke at age 69 years old.  At age 46 years old she had pacemaker placed.  Is also been on anticoagulation for atrial fibrillation for many years. ? ?Since that time she has had about 10 events throughout her life consisting of unilateral numbness, weakness, facial droop, speech difficulty.  4 of these may have been TIA events.  The remaining events may have been complicated migraine variants.  Patient had previously been treated with topiramate for migraine with good results.  Patient did work very well over several years and was weaned off of topiramate several years ago. ? ?04/16/2021 patient had another event of transient confusion, difficulty reading highway signs, difficulty expressing herself.  Husband was with her in the car.  She seems somewhat confused.  Symptoms lasted for about an hour.  She went to the hospital for evaluation and had CT of the head and other testing which were unremarkable.  EEG was normal.  Mild hyponatremia was noted.  Symptoms resolved and patient was discharged home. ? ? ?REVIEW OF SYSTEMS: Full 14 system review of systems performed and negative with exception of: as per HPI. ? ?ALLERGIES: ?Allergies  ?Allergen Reactions  ? Other Other (See Comments)  ?  All QTc prolongation drugs  ? Statins Other (See Comments)  ?  Muscle aches  ?  Adhesive [Tape] Rash  ? Latex Rash  ? ? ?HOME MEDICATIONS: ?Outpatient Medications Prior to Visit  ?Medication Sig Dispense Refill  ? acebutolol (SECTRAL) 200 MG capsule TAKE 1 CAPSULE BY MOUTH EVERYDAY AT BEDTIME 90 capsule 3  ? citalopram (CELEXA) 20 MG tablet TAKE 1 TABLET BY MOUTH EVERY DAY 90 tablet 1  ? ELIQUIS 5 MG TABS tablet TAKE 1 TABLET BY MOUTH TWICE A DAY 180 tablet 1  ? ezetimibe (ZETIA) 10 MG tablet TAKE 1 TABLET BY MOUTH DAILY. 90 tablet 3  ? levothyroxine (SYNTHROID) 75 MCG tablet Take 1 tablet (75 mcg total) by mouth daily before breakfast. 90 tablet 0  ? LORazepam (ATIVAN) 1 MG tablet TAKE 1 TABLET BY MOUTH EVERY 8 HOURS AS NEEDED FOR ANXIETY 90 tablet 1  ? ?No facility-administered medications prior to visit.  ? ? ?PAST MEDICAL HISTORY: ?Past Medical History:  ?Diagnosis Date  ? Abnormal gait   ? Allergic rhinitis   ? Anxiety   ? Atrial fibrillation ([place])   ? Carcinomas, basal cell   ? Cardiac pacemaker   ? DDD  MDT  ? Cerebrovascular disease   ? Clotting disorder ([place])   ? DVT  ? Depression   ? Dysautonomia ([place])   ? Ehler's-Danlos syndrome   ? Fibromyalgia   ? Headache(784.0)   ? Migraine  ? Heart murmur   ? Hemorrhoids   ? Herpes zoster   ? History of syncope   ? History of tachycardia-bradycardia syndrome   ?  History of transient ischemic attack   ? Hypercholesteremia   ? Hypercholesterolemia   ? Hypothyroidism   ? Long term (current) use of anticoagulants   ? Migraine with aura 06/21/2017  ? Episodes of aphasia and confusion  ? Mitral valve prolapse   ? PAT (paroxysmal atrial tachycardia) ([place])   ? PSVT (paroxysmal supraventricular tachycardia) ([place])   ? Skin desquamation   ? inflammative vaginitis  ? Stroke [facility])   ? SVT (supraventricular tachycardia) ([place])   ? ? ?PAST SURGICAL HISTORY: ?Past Surgical History:  ?Procedure Laterality Date  ? ABLATION    ? x 2  ? biopsy    ? gum  ? CAROTID ENDARTERECTOMY    ? complex mitral valve repair    ? at the [facility]  ? INSERT / REPLACE / REMOVE  PACEMAKER    ? LEAD REVISION    ? left carotid endarterectomy  11/2003  ? by Dr. Delton See  ? PACEMAKER INSERTION    ? medtronic kappa [id number]  ? removed chalazion from left eye lid  09/2011  ? Dr. Satira Sark  ? ? ?FAMILY HISTORY: ?Family History  ?Problem Relation Age of Onset  ? Lung cancer Mother   ? Prostate cancer Father   ? Alzheimer's disease Father   ? Heart disease Father   ? Ehlers-Danlos syndrome Father   ? Mitral valve prolapse Brother   ? Diabetes Sister   ? Mitral valve prolapse Sister   ? Leukemia Other   ?     Nephew  ? Heart disease Other   ?     entire maternal side of family  ? Cancer Other   ?     entire maternal side of family  ? ? ?SOCIAL HISTORY: ?Social History  ? ?Socioeconomic History  ? Marital status: Married  ?  Spouse name: Fritz Pickerel  ? Number of children: 0  ? Years of education: S-College  ? Highest education level: Not on file  ?Occupational History  ? Occupation: retired  ?  Employer: RETIRED  ?Tobacco Use  ? Smoking status: Never  ? Smokeless tobacco: Never  ? Tobacco comments:  ?  smoked in her early 20's--smoked 4 cigs per year   ?Vaping Use  ? Vaping Use: Never used  ?Substance and Sexual Activity  ? Alcohol use: No  ?  Alcohol/week: 0.0 standard drinks  ?  Comment: 2 drinks per year  ? Drug use: No  ? Sexual activity: Not on file  ?Other Topics Concern  ? Not on file  ?Social History Narrative  ? Lives with spouse  ? Patient drinks about 2 cups of caffeine daily.  ? Patient is right handed.   ? ?Social Determinants of Health  ? ?Financial Resource Strain: Low Risk   ? Difficulty of Paying Living Expenses: Not hard at all  ?Food Insecurity: No Food Insecurity  ? Worried About Charity fundraiser in the Last Year: Never true  ? Ran Out of Food in the Last Year: Never true  ?Transportation Needs: No Transportation Needs  ? Lack of Transportation (Medical): No  ? Lack of Transportation (Non-Medical): No  ?Physical Activity: Inactive  ? Days of Exercise per Week: 0 days  ? Minutes of Exercise per  Session: 0 min  ?Stress: No Stress Concern Present  ? Feeling of Stress : Not at all  ?Social Connections: Socially Integrated  ? Frequency of Communication with Friends and Family: More than three times a week  ? Frequency of Social  Gatherings with Friends and Family: More than three times a week  ? Attends Religious Services: More than 4 times per year  ? Active Member of Clubs or Organizations: No  ? Attends Archivist Meetings: More than 4 times per year  ? Marital Status: Married  ?Intimate Partner Violence: Not At Risk  ? Fear of Current or Ex-Partner: No  ? Emotionally Abused: No  ? Physically Abused: No  ? Sexually Abused: No  ? ? ? ?PHYSICAL EXAM ? ?GENERAL EXAM/CONSTITUTIONAL: ?Vitals:  ?Vitals:  ? 04/20/21 1108  ?BP: 124/63  ?Pulse: 60  ?Weight: 137 lb (62.1 kg)  ?Height: 5' 10.5" (1.791 m)  ? ?Body mass index is 19.38 kg/m?. ?Wt Readings from Last 3 Encounters:  ?04/20/21 137 lb (62.1 kg)  ?02/17/21 136 lb 9.6 oz (62 kg)  ?02/08/21 133 lb (60.3 kg)  ? ?Patient is in no distress; well developed, nourished and groomed; neck is supple ? ?CARDIOVASCULAR: ?Examination of carotid arteries is normal; no carotid bruits ?Regular rate and rhythm, no murmurs ?Examination of peripheral vascular system by observation and palpation is normal ? ?EYES: ?Ophthalmoscopic exam of optic discs and posterior segments is normal; no papilledema or hemorrhages ?No results found. ? ?MUSCULOSKELETAL: ?Gait, strength, tone, movements noted in Neurologic exam below ? ?NEUROLOGIC: ?MENTAL STATUS:  ? ?  06/21/2017  ? 10:57 AM 06/20/2016  ? 12:00 PM 12/23/2015  ? 10:19 AM  ?MMSE - Mini Mental State Exam  ?Orientation to time '5 4 4  '$ ?Orientation to Place '5 5 5  '$ ?Registration '3 3 3  '$ ?Attention/ Calculation '4 5 5  '$ ?Recall '3 2 2  '$ ?Language- name 2 objects '2 2 2  '$ ?Language- repeat '1 1 1  '$ ?Language- follow 3 step command '3 3 3  '$ ?Language- read & follow direction '1 1 1  '$ ?Write a sentence '1 1 1  '$ ?Copy design '1 1 1  '$ ?Total score '29 28  28  '$ ? ?awake, alert, oriented to person, place and time ?recent and remote memory intact ?normal attention and concentration ?language fluent, comprehension intact, naming intact ?fund of knowledge ap

## 2021-04-25 ENCOUNTER — Ambulatory Visit (INDEPENDENT_AMBULATORY_CARE_PROVIDER_SITE_OTHER): Payer: Medicare Other | Admitting: Internal Medicine

## 2021-04-25 ENCOUNTER — Encounter: Payer: Self-pay | Admitting: Internal Medicine

## 2021-04-25 VITALS — BP 118/72 | HR 85 | Temp 98.2°F | Ht 70.5 in | Wt 138.0 lb

## 2021-04-25 DIAGNOSIS — E871 Hypo-osmolality and hyponatremia: Secondary | ICD-10-CM | POA: Diagnosis not present

## 2021-04-25 DIAGNOSIS — E039 Hypothyroidism, unspecified: Secondary | ICD-10-CM | POA: Diagnosis not present

## 2021-04-25 DIAGNOSIS — E785 Hyperlipidemia, unspecified: Secondary | ICD-10-CM

## 2021-04-25 LAB — CBC WITH DIFFERENTIAL/PLATELET
Basophils Absolute: 0 10*3/uL (ref 0.0–0.1)
Basophils Relative: 0.4 % (ref 0.0–3.0)
Eosinophils Absolute: 0.2 10*3/uL (ref 0.0–0.7)
Eosinophils Relative: 2.7 % (ref 0.0–5.0)
HCT: 38 % (ref 36.0–46.0)
Hemoglobin: 12.9 g/dL (ref 12.0–15.0)
Lymphocytes Relative: 26.8 % (ref 12.0–46.0)
Lymphs Abs: 1.6 10*3/uL (ref 0.7–4.0)
MCHC: 34 g/dL (ref 30.0–36.0)
MCV: 89.9 fl (ref 78.0–100.0)
Monocytes Absolute: 0.6 10*3/uL (ref 0.1–1.0)
Monocytes Relative: 10.3 % (ref 3.0–12.0)
Neutro Abs: 3.6 10*3/uL (ref 1.4–7.7)
Neutrophils Relative %: 59.8 % (ref 43.0–77.0)
Platelets: 172 10*3/uL (ref 150.0–400.0)
RBC: 4.23 Mil/uL (ref 3.87–5.11)
RDW: 13.1 % (ref 11.5–15.5)
WBC: 6 10*3/uL (ref 4.0–10.5)

## 2021-04-25 LAB — BASIC METABOLIC PANEL
BUN: 16 mg/dL (ref 6–23)
CO2: 27 mEq/L (ref 19–32)
Calcium: 9.8 mg/dL (ref 8.4–10.5)
Chloride: 96 mEq/L (ref 96–112)
Creatinine, Ser: 0.71 mg/dL (ref 0.40–1.20)
GFR: 87.2 mL/min (ref >60.00)
Glucose, Bld: 84 mg/dL (ref 70–99)
Potassium: 4.5 mEq/L (ref 3.5–5.1)
Sodium: 131 mEq/L — ABNORMAL LOW (ref 135–145)

## 2021-04-25 LAB — LIPID PANEL
Cholesterol: 173 mg/dL (ref 0–200)
HDL: 59.4 mg/dL (ref >39.00)
LDL Cholesterol: 91 mg/dL (ref 0–99)
NonHDL: 113.85
Total CHOL/HDL Ratio: 3
Triglycerides: 112 mg/dL (ref 0.0–149.0)
VLDL: 22.4 mg/dL (ref 0.0–40.0)

## 2021-04-25 LAB — TSH: TSH: 0.35 u[IU]/mL (ref 0.35–5.50)

## 2021-04-25 MED ORDER — LEVOTHYROXINE SODIUM 75 MCG PO TABS
75.0000 ug | ORAL_TABLET | Freq: Every day | ORAL | 1 refills | Status: DC
Start: 2021-04-25 — End: 2021-09-30

## 2021-04-25 NOTE — Progress Notes (Signed)
? ?Subjective:  ?Patient ID: Amy Lane, female    DOB: [birth year]  Age: 71 y.o. MRN: [id number] ? ?CC: Hypothyroidism ? ?This visit occurred during the SARS-CoV-2 public health emergency.  Safety protocols were in place, including screening questions prior to the visit, additional usage of staff PPE, and extensive cleaning of exam room while observing appropriate contact time as indicated for disinfecting solutions.   ? ?HPI ?Amy Lane presents for f/up -  ? ?She recently saw neurology for word finding difficulty and amnestic episodes.  The symptoms have improved and no pathology has been found.  She otherwise feels well and offers no other complaints. ? ?Outpatient Medications Prior to Visit  ?Medication Sig Dispense Refill  ? acebutolol (SECTRAL) 200 MG capsule TAKE 1 CAPSULE BY MOUTH EVERYDAY AT BEDTIME 90 capsule 3  ? citalopram (CELEXA) 20 MG tablet TAKE 1 TABLET BY MOUTH EVERY DAY 90 tablet 1  ? ELIQUIS 5 MG TABS tablet TAKE 1 TABLET BY MOUTH TWICE A DAY 180 tablet 1  ? ezetimibe (ZETIA) 10 MG tablet TAKE 1 TABLET BY MOUTH DAILY. 90 tablet 3  ? LORazepam (ATIVAN) 1 MG tablet TAKE 1 TABLET BY MOUTH EVERY 8 HOURS AS NEEDED FOR ANXIETY 90 tablet 1  ? levothyroxine (SYNTHROID) 75 MCG tablet Take 1 tablet (75 mcg total) by mouth daily before breakfast. 90 tablet 0  ? ?No facility-administered medications prior to visit.  ? ? ?ROS ?Review of Systems  ?Constitutional:  Negative for diaphoresis, fatigue and unexpected weight change.  ?HENT: Negative.    ?Eyes: Negative.   ?Respiratory:  Negative for cough, shortness of breath and wheezing.   ?Cardiovascular:  Negative for chest pain, palpitations and leg swelling.  ?Gastrointestinal:  Negative for abdominal pain, constipation, diarrhea, nausea and vomiting.  ?Endocrine: Negative.  Negative for cold intolerance and heat intolerance.  ?Genitourinary: Negative.  Negative for difficulty urinating.  ?Musculoskeletal: Negative.   ?Skin: Negative.    ?Neurological:  Negative for dizziness, weakness, light-headedness and headaches.  ?Hematological:  Negative for adenopathy. Does not bruise/bleed easily.  ?Psychiatric/Behavioral: Negative.    ? ?Objective:  ?BP 118/72 (BP Location: Left Arm, Patient Position: Sitting, Cuff Size: Large)   Pulse 85   Temp 98.2 ?F (36.8 ?C) (Oral)   Ht 5' 10.5" (1.791 m)   Wt 138 lb (62.6 kg)   SpO2 96%   BMI 19.52 kg/m?  ? ?BP Readings from Last 3 Encounters:  ?04/25/21 118/72  ?04/20/21 124/63  ?02/17/21 118/76  ? ? ?Wt Readings from Last 3 Encounters:  ?04/25/21 138 lb (62.6 kg)  ?04/20/21 137 lb (62.1 kg)  ?02/17/21 136 lb 9.6 oz (62 kg)  ? ? ?Physical Exam ?Vitals reviewed.  ?HENT:  ?   Nose: Nose normal.  ?   Mouth/Throat:  ?   Mouth: Mucous membranes are moist.  ?Eyes:  ?   General: No scleral icterus. ?   Conjunctiva/sclera: Conjunctivae normal.  ?Cardiovascular:  ?   Rate and Rhythm: Normal rate and regular rhythm.  ?   Heart sounds: No murmur heard. ?Pulmonary:  ?   Effort: Pulmonary effort is normal.  ?   Breath sounds: No stridor. No wheezing, rhonchi or rales.  ?Abdominal:  ?   General: Abdomen is flat.  ?   Palpations: There is no mass.  ?   Tenderness: There is no abdominal tenderness. There is no guarding.  ?   Hernia: No hernia is present.  ?Musculoskeletal:     ?   General: Normal  range of motion.  ?   Cervical back: Neck supple.  ?   Right lower leg: No edema.  ?   Left lower leg: No edema.  ?Lymphadenopathy:  ?   Cervical: No cervical adenopathy.  ?Skin: ?   General: Skin is warm and dry.  ?Neurological:  ?   General: No focal deficit present.  ?   Mental Status: She is alert. Mental status is at baseline.  ?Psychiatric:     ?   Mood and Affect: Mood normal.     ?   Behavior: Behavior normal.  ? ? ?Lab Results  ?Component Value Date  ? WBC 6.0 04/25/2021  ? HGB 12.9 04/25/2021  ? HCT 38.0 04/25/2021  ? PLT 172.0 04/25/2021  ? GLUCOSE 84 04/25/2021  ? CHOL 173 04/25/2021  ? TRIG 112.0 04/25/2021  ? HDL 59.40  04/25/2021  ? LDLDIRECT 101.0 10/07/2018  ? [place] 91 04/25/2021  ? ALT 13 05/13/2020  ? AST 16 05/13/2020  ? NA 131 (L) 04/25/2021  ? K 4.5 04/25/2021  ? CL 96 04/25/2021  ? CREATININE 0.71 04/25/2021  ? BUN 16 04/25/2021  ? CO2 27 04/25/2021  ? TSH 0.35 04/25/2021  ? INR 1.11 06/19/2017  ? HGBA1C 5.5 06/20/2017  ? ? ?MM 3D SCREEN BREAST BILATERAL ? ?Result Date: 02/16/2021 ?CLINICAL DATA:  Screening. EXAM: DIGITAL SCREENING BILATERAL MAMMOGRAM WITH TOMOSYNTHESIS AND CAD TECHNIQUE: Bilateral screening digital craniocaudal and mediolateral oblique mammograms were obtained. Bilateral screening digital breast tomosynthesis was performed. The images were evaluated with computer-aided detection. COMPARISON:  Previous exam(s). ACR Breast Density Category d: The breast tissue is extremely dense, which lowers the sensitivity of mammography FINDINGS: There are no findings suspicious for malignancy. IMPRESSION: No mammographic evidence of malignancy. A result letter of this screening mammogram will be mailed directly to the patient. RECOMMENDATION: Screening mammogram in one year. (Code:SM-B-01Y) BI-RADS CATEGORY  1: Negative. Electronically Signed   By: Nolon Nations M.D.   On: 02/16/2021 15:27  ? ? ?Assessment & Plan:  ? ?Homer was seen today for hypothyroidism. ? ?Diagnoses and all orders for this visit: ? ?Acquired hypothyroidism- She is euthyroid and her TSH is in the normal range.  Will continue the current T4 dosage. ?-     TSH; Future ?-     CBC with Differential/Platelet; Future ?-     CBC with Differential/Platelet ?-     TSH ?-     levothyroxine (SYNTHROID) 75 MCG tablet; Take 1 tablet (75 mcg total) by mouth daily before breakfast. ? ?Hyponatremia- Her sodium level is stable and she is asymptomatic. ?-     Basic metabolic panel; Future ?-     CBC with Differential/Platelet; Future ?-     CBC with Differential/Platelet ?-     Basic metabolic panel ? ?Hyperlipidemia LDL goal <130- She is not willing to take a  statin. ?-     Lipid panel; Future ?-     CBC with Differential/Platelet; Future ?-     CBC with Differential/Platelet ?-     Lipid panel ? ? ?I am having Mardene Celeste T. [place] maintain her Eliquis, citalopram, LORazepam, acebutolol, ezetimibe, and levothyroxine. ? ?Meds ordered this encounter  ?Medications  ? levothyroxine (SYNTHROID) 75 MCG tablet  ?  Sig: Take 1 tablet (75 mcg total) by mouth daily before breakfast.  ?  Dispense:  90 tablet  ?  Refill:  1  ? ? ? ?Follow-up: No follow-ups on file. ? ?Scarlette Calico, MD ?

## 2021-04-26 ENCOUNTER — Encounter: Payer: Self-pay | Admitting: Internal Medicine

## 2021-04-26 NOTE — Patient Instructions (Signed)

## 2021-05-05 ENCOUNTER — Ambulatory Visit (INDEPENDENT_AMBULATORY_CARE_PROVIDER_SITE_OTHER): Payer: Medicare Other

## 2021-05-05 DIAGNOSIS — I495 Sick sinus syndrome: Secondary | ICD-10-CM

## 2021-05-06 LAB — CUP PACEART REMOTE DEVICE CHECK
Battery Impedance: 1960 Ohm
Battery Remaining Longevity: 36 mo
Battery Voltage: 2.75 V
Brady Statistic AP VP Percent: 0 %
Brady Statistic AP VS Percent: 81 %
Brady Statistic AS VP Percent: 0 %
Brady Statistic AS VS Percent: 19 %
Date Time Interrogation Session: 20230414115121
Implantable Pulse Generator Implant Date: 20110310
Lead Channel Impedance Value: 398 Ohm
Lead Channel Impedance Value: 633 Ohm
Lead Channel Pacing Threshold Amplitude: 0.875 V
Lead Channel Pacing Threshold Amplitude: 1 V
Lead Channel Pacing Threshold Pulse Width: 0.4 ms
Lead Channel Pacing Threshold Pulse Width: 0.4 ms
Lead Channel Setting Pacing Amplitude: 2 V
Lead Channel Setting Pacing Amplitude: 2.5 V
Lead Channel Setting Pacing Pulse Width: 0.4 ms
Lead Channel Setting Sensing Sensitivity: 2.8 mV

## 2021-05-20 NOTE — Progress Notes (Signed)
Remote pacemaker transmission.   

## 2021-05-26 ENCOUNTER — Inpatient Hospital Stay: Payer: Medicare Other | Admitting: Internal Medicine

## 2021-06-14 DIAGNOSIS — H2513 Age-related nuclear cataract, bilateral: Secondary | ICD-10-CM | POA: Diagnosis not present

## 2021-06-15 ENCOUNTER — Ambulatory Visit: Payer: Medicare Other | Admitting: Neurology

## 2021-06-27 ENCOUNTER — Encounter: Payer: Self-pay | Admitting: Internal Medicine

## 2021-06-27 ENCOUNTER — Ambulatory Visit (INDEPENDENT_AMBULATORY_CARE_PROVIDER_SITE_OTHER): Payer: Medicare Other | Admitting: Internal Medicine

## 2021-06-27 VITALS — BP 122/72 | HR 72 | Temp 98.1°F | Ht 70.5 in | Wt 140.0 lb

## 2021-06-27 DIAGNOSIS — E039 Hypothyroidism, unspecified: Secondary | ICD-10-CM | POA: Diagnosis not present

## 2021-06-27 DIAGNOSIS — F411 Generalized anxiety disorder: Secondary | ICD-10-CM

## 2021-06-27 DIAGNOSIS — E871 Hypo-osmolality and hyponatremia: Secondary | ICD-10-CM

## 2021-06-27 DIAGNOSIS — Z23 Encounter for immunization: Secondary | ICD-10-CM

## 2021-06-27 LAB — BASIC METABOLIC PANEL
BUN: 12 mg/dL (ref 6–23)
CO2: 29 mEq/L (ref 19–32)
Calcium: 9.7 mg/dL (ref 8.4–10.5)
Chloride: 98 mEq/L (ref 96–112)
Creatinine, Ser: 0.85 mg/dL (ref 0.40–1.20)
GFR: 70.18 mL/min (ref >60.00)
Glucose, Bld: 91 mg/dL (ref 70–99)
Potassium: 4.6 mEq/L (ref 3.5–5.1)
Sodium: 133 mEq/L — ABNORMAL LOW (ref 135–145)

## 2021-06-27 LAB — TSH: TSH: 0.66 u[IU]/mL (ref 0.35–5.50)

## 2021-06-27 MED ORDER — LORAZEPAM 1 MG PO TABS: 1.0000 mg | ORAL_TABLET | Freq: Three times a day (TID) | ORAL | 3 refills | Status: DC | PRN

## 2021-06-27 MED ORDER — SHINGRIX 50 MCG/0.5ML IM SUSR: 0.5000 mL | Freq: Once | INTRAMUSCULAR | 1 refills | Status: AC

## 2021-06-27 NOTE — Patient Instructions (Signed)

## 2021-06-27 NOTE — Progress Notes (Signed)
Subjective:  Patient ID: Amy Lane, female    DOB: 12/15/1950  Age: 71 y.o. MRN: [id number]  CC: Hypothyroidism   HPI Amy Lane presents for f/up -  She continues to c/o insomnia which she has had for 81 years after her niece was murdered in [place]. The current dose of ativan allows her to sleep. She otherwise feels well and offers no other complaints.  Outpatient Medications Prior to Visit  Medication Sig Dispense Refill   acebutolol (SECTRAL) 200 MG capsule TAKE 1 CAPSULE BY MOUTH EVERYDAY AT BEDTIME 90 capsule 3   citalopram (CELEXA) 20 MG tablet TAKE 1 TABLET BY MOUTH EVERY DAY 90 tablet 1   ELIQUIS 5 MG TABS tablet TAKE 1 TABLET BY MOUTH TWICE A DAY 180 tablet 1   ezetimibe (ZETIA) 10 MG tablet TAKE 1 TABLET BY MOUTH DAILY. 90 tablet 3   levothyroxine (SYNTHROID) 75 MCG tablet Take 1 tablet (75 mcg total) by mouth daily before breakfast. 90 tablet 1   LORazepam (ATIVAN) 1 MG tablet TAKE 1 TABLET BY MOUTH EVERY 8 HOURS AS NEEDED FOR ANXIETY 90 tablet 1   No facility-administered medications prior to visit.    ROS Review of Systems  Constitutional:  Negative for chills, diaphoresis, fatigue and fever.  HENT: Negative.    Eyes: Negative.  Negative for visual disturbance.  Respiratory:  Negative for cough, chest tightness, shortness of breath and wheezing.   Cardiovascular:  Negative for chest pain, palpitations and leg swelling.  Gastrointestinal:  Negative for abdominal pain, constipation, diarrhea, nausea and vomiting.  Endocrine: Negative.   Genitourinary: Negative.   Musculoskeletal: Negative.   Skin: Negative.   Neurological:  Negative for dizziness, weakness, light-headedness and headaches.  Hematological:  Negative for adenopathy. Does not bruise/bleed easily.  Psychiatric/Behavioral:  Positive for sleep disturbance. Negative for confusion, decreased concentration, dysphoric mood and suicidal ideas. The patient is not nervous/anxious.    Objective:   BP 122/72 (BP Location: Left Arm, Patient Position: Sitting, Cuff Size: Large)   Pulse 72   Temp 98.1 F (36.7 C) (Oral)   Ht 5' 10.5" (1.791 m)   Wt 140 lb (63.5 kg)   SpO2 97%   BMI 19.80 kg/m   BP Readings from Last 3 Encounters:  06/27/21 122/72  04/25/21 118/72  04/20/21 124/63    Wt Readings from Last 3 Encounters:  06/27/21 140 lb (63.5 kg)  04/25/21 138 lb (62.6 kg)  04/20/21 137 lb (62.1 kg)    Physical Exam Vitals reviewed.  Constitutional:      Appearance: She is not ill-appearing.  HENT:     Nose: Nose normal.     Mouth/Throat:     Mouth: Mucous membranes are moist.  Eyes:     General: No scleral icterus.    Conjunctiva/sclera: Conjunctivae normal.  Cardiovascular:     Rate and Rhythm: Normal rate and regular rhythm.     Heart sounds: No murmur heard. Pulmonary:     Effort: Pulmonary effort is normal.     Breath sounds: No stridor. No wheezing, rhonchi or rales.  Abdominal:     General: Abdomen is flat.     Palpations: There is no mass.     Tenderness: There is no abdominal tenderness. There is no guarding or rebound.     Hernia: No hernia is present.  Musculoskeletal:        General: Normal range of motion.     Cervical back: Neck supple.     Right lower  leg: No edema.     Left lower leg: No edema.  Lymphadenopathy:     Cervical: No cervical adenopathy.  Skin:    General: Skin is warm and dry.  Neurological:     General: No focal deficit present.     Mental Status: She is alert. Mental status is at baseline.  Psychiatric:        Mood and Affect: Mood normal.        Behavior: Behavior normal.    Lab Results  Component Value Date   WBC 6.0 04/25/2021   HGB 12.9 04/25/2021   HCT 38.0 04/25/2021   PLT 172.0 04/25/2021   GLUCOSE 91 06/27/2021   CHOL 173 04/25/2021   TRIG 112.0 04/25/2021   HDL 59.40 04/25/2021   LDLDIRECT 101.0 10/07/2018   LDLCALC 91 04/25/2021   ALT 13 05/13/2020   AST 16 05/13/2020   NA 133 (L) 06/27/2021   K  4.6 06/27/2021   CL 98 06/27/2021   CREATININE 0.85 06/27/2021   BUN 12 06/27/2021   CO2 29 06/27/2021   TSH 0.66 06/27/2021   INR 1.11 06/19/2017   HGBA1C 5.5 06/20/2017    MM 3D SCREEN BREAST BILATERAL  Result Date: 02/16/2021 CLINICAL DATA:  Screening. EXAM: DIGITAL SCREENING BILATERAL MAMMOGRAM WITH TOMOSYNTHESIS AND CAD TECHNIQUE: Bilateral screening digital craniocaudal and mediolateral oblique mammograms were obtained. Bilateral screening digital breast tomosynthesis was performed. The images were evaluated with computer-aided detection. COMPARISON:  Previous exam(s). ACR Breast Density Category d: The breast tissue is extremely dense, which lowers the sensitivity of mammography FINDINGS: There are no findings suspicious for malignancy. IMPRESSION: No mammographic evidence of malignancy. A result letter of this screening mammogram will be mailed directly to the patient. RECOMMENDATION: Screening mammogram in one year. (Code:[id number]-01Y) BI-RADS CATEGORY  1: Negative. Electronically Signed   By: Amy Lane M.D.   On: 02/16/2021 15:27    Assessment & Plan:   Amy Lane was seen today for hypothyroidism.  Diagnoses and all orders for this visit:  Acquired hypothyroidism- She is euthyroid. -     TSH; Future -     TSH  GAD (generalized anxiety disorder) -     LORazepam (ATIVAN) 1 MG tablet; Take 1 tablet (1 mg total) by mouth every 8 (eight) hours as needed. for anxiety  Hyponatremia- Her Na+ is stable and she is asx. -     Basic metabolic panel; Future -     Basic metabolic panel  Need for prophylactic vaccination and inoculation against varicella -     Zoster Vaccine Adjuvanted [facility]) injection; Inject 0.5 mLs into the muscle once for 1 dose.   I have changed Amy Lane's LORazepam. I am also having her start on Shingrix. Additionally, I am having her maintain her Eliquis, citalopram, acebutolol, ezetimibe, and levothyroxine.  Meds ordered this encounter   Medications   LORazepam (ATIVAN) 1 MG tablet    Sig: Take 1 tablet (1 mg total) by mouth every 8 (eight) hours as needed. for anxiety    Dispense:  90 tablet    Refill:  3    This request is for a new prescription for a controlled substance as required by Federal/State law.   Zoster Vaccine Adjuvanted [facility]) injection    Sig: Inject 0.5 mLs into the muscle once for 1 dose.    Dispense:  0.5 mL    Refill:  1     Follow-up: Return in about 6 months (around 12/27/2021).  Scarlette Calico, MD

## 2021-07-08 ENCOUNTER — Other Ambulatory Visit: Payer: Self-pay

## 2021-07-11 MED ORDER — APIXABAN 5 MG PO TABS: 5.0000 mg | ORAL_TABLET | Freq: Two times a day (BID) | ORAL | 1 refills | Status: DC

## 2021-07-11 NOTE — Telephone Encounter (Signed)
SCr 0.85 in June 2023, age 71, weight 64kg, afib, last visit 01/2021

## 2021-07-20 ENCOUNTER — Other Ambulatory Visit: Payer: Self-pay | Admitting: Internal Medicine

## 2021-07-20 DIAGNOSIS — F411 Generalized anxiety disorder: Secondary | ICD-10-CM

## 2021-07-25 DIAGNOSIS — L821 Other seborrheic keratosis: Secondary | ICD-10-CM | POA: Diagnosis not present

## 2021-07-25 DIAGNOSIS — L814 Other melanin hyperpigmentation: Secondary | ICD-10-CM | POA: Diagnosis not present

## 2021-07-25 DIAGNOSIS — D225 Melanocytic nevi of trunk: Secondary | ICD-10-CM | POA: Diagnosis not present

## 2021-07-28 ENCOUNTER — Ambulatory Visit (INDEPENDENT_AMBULATORY_CARE_PROVIDER_SITE_OTHER): Payer: Medicare Other | Admitting: Internal Medicine

## 2021-07-28 ENCOUNTER — Encounter: Payer: Self-pay | Admitting: Internal Medicine

## 2021-07-28 VITALS — BP 112/68 | HR 64 | Temp 97.9°F | Ht 70.0 in | Wt 141.1 lb

## 2021-07-28 DIAGNOSIS — E871 Hypo-osmolality and hyponatremia: Secondary | ICD-10-CM

## 2021-07-28 DIAGNOSIS — E538 Deficiency of other specified B group vitamins: Secondary | ICD-10-CM

## 2021-07-28 DIAGNOSIS — D649 Anemia, unspecified: Secondary | ICD-10-CM | POA: Insufficient documentation

## 2021-07-28 DIAGNOSIS — E559 Vitamin D deficiency, unspecified: Secondary | ICD-10-CM

## 2021-07-28 LAB — CBC WITH DIFFERENTIAL/PLATELET
Basophils Absolute: 0.1 10*3/uL (ref 0.0–0.1)
Basophils Relative: 1.2 % (ref 0.0–3.0)
Eosinophils Absolute: 0.1 10*3/uL (ref 0.0–0.7)
Eosinophils Relative: 1.2 % (ref 0.0–5.0)
HCT: 38.9 % (ref 36.0–46.0)
Hemoglobin: 13.1 g/dL (ref 12.0–15.0)
Lymphocytes Relative: 25.3 % (ref 12.0–46.0)
Lymphs Abs: 1.5 10*3/uL (ref 0.7–4.0)
MCHC: 33.6 g/dL (ref 30.0–36.0)
MCV: 91.2 fl (ref 78.0–100.0)
Monocytes Absolute: 0.8 10*3/uL (ref 0.1–1.0)
Monocytes Relative: 12.5 % — ABNORMAL HIGH (ref 3.0–12.0)
Neutro Abs: 3.6 10*3/uL (ref 1.4–7.7)
Neutrophils Relative %: 59.8 % (ref 43.0–77.0)
Platelets: 160 10*3/uL (ref 150.0–400.0)
RBC: 4.26 Mil/uL (ref 3.87–5.11)
RDW: 13.9 % (ref 11.5–15.5)
WBC: 6 10*3/uL (ref 4.0–10.5)

## 2021-07-28 LAB — IBC PANEL
Iron: 95 ug/dL (ref 42–145)
Saturation Ratios: 20.4 % (ref 20.0–50.0)
TIBC: 464.8 ug/dL — ABNORMAL HIGH (ref 250.0–450.0)
Transferrin: 332 mg/dL (ref 212.0–360.0)

## 2021-07-28 LAB — VITAMIN B12: Vitamin B-12: 285 pg/mL (ref 211–911)

## 2021-07-28 LAB — VITAMIN D 25 HYDROXY (VIT D DEFICIENCY, FRACTURES): VITD: 27.36 ng/mL — ABNORMAL LOW (ref 30.00–100.00)

## 2021-07-28 LAB — FERRITIN: Ferritin: 29.2 ng/mL (ref 10.0–291.0)

## 2021-07-28 NOTE — Assessment & Plan Note (Signed)
Chronic mild stable Lab Results  Component Value Date   NA 133 (L) 06/27/2021   K 4.6 06/27/2021   CO2 29 06/27/2021   GLUCOSE 91 06/27/2021   BUN 12 06/27/2021   CREATININE 0.85 06/27/2021   CALCIUM 9.7 06/27/2021   GFRNONAA 77 11/06/2019  d/w pt possible SIADH

## 2021-07-28 NOTE — Patient Instructions (Signed)
Please continue all other medications as before, and refills have been done if requested.  Please have the pharmacy call with any other refills you may need.  Please keep your appointments with your specialists as you may have planned  Please go to the LAB at the blood drawing area for the tests to be done  You will be contacted by phone if any changes need to be made immediately.  Otherwise, you will receive a letter about your results with an explanation, but please check with MyChart first.  Please remember to sign up for MyChart if you have not done so, as this will be important to you in the future with finding out test results, communicating by private email, and scheduling acute appointments online when needed.

## 2021-07-28 NOTE — Assessment & Plan Note (Signed)
Mild transient approx 3 yrs ago, for f/u lab

## 2021-07-28 NOTE — Assessment & Plan Note (Signed)
Last vitamin D Lab Results  Component Value Date   VD25OH 27.36 (L) 07/28/2021   Low, to start oral replacement

## 2021-07-28 NOTE — Progress Notes (Signed)
Patient ID: Amy Lane, female   DOB: 1950/05/30, 71 y.o.   MRN: [id number]        Chief Complaint: follow up low vit d, pale skin, hx of anemia       HPI:  Amy Lane is a 71 y.o. female here to f/u after seeing dermatology who noted her pale skin and suggested since she did not get much sun that she have her Vit D checked and consider replacement.  Pt denies chest pain, increased sob or doe, wheezing, orthopnea, PND, increased LE swelling, palpitations, dizziness or syncope.   Pt denies polydipsia, polyuria, or new focal neuro s/s.   Chart review also indicates chronic mild low Sodium as well as hx anemia mild prior to 5176 of uncertain etiology.  No overt bleeding recently.         Wt Readings from Last 3 Encounters:  07/28/21 141 lb 2 oz (64 kg)  06/27/21 140 lb (63.5 kg)  04/25/21 138 lb (62.6 kg)   BP Readings from Last 3 Encounters:  07/28/21 112/68  06/27/21 122/72  04/25/21 118/72         Past Medical History:  Diagnosis Date   Abnormal gait    Allergic rhinitis    Anxiety    Atrial fibrillation (HCC)    Carcinomas, basal cell    Cardiac pacemaker    DDD  MDT   Cerebrovascular disease    Clotting disorder ([place])    DVT   Depression    Dysautonomia ([place])    Ehler's-Danlos syndrome    Fibromyalgia    Headache(784.0)    Migraine   Heart murmur    Hemorrhoids    Herpes zoster    History of syncope    History of tachycardia-bradycardia syndrome    History of transient ischemic attack    Hypercholesteremia    Hypercholesterolemia    Hypothyroidism    Long term (current) use of anticoagulants    Migraine with aura 06/21/2017   Episodes of aphasia and confusion   Mitral valve prolapse    PAT (paroxysmal atrial tachycardia) (HCC)    PSVT (paroxysmal supraventricular tachycardia) (HCC)    Skin desquamation    inflammative vaginitis   Stroke (HCC)    SVT (supraventricular tachycardia) (HCC)    Past Surgical History:  Procedure Laterality Date    ABLATION     x 2   biopsy     gum   CAROTID ENDARTERECTOMY     complex mitral valve repair     at the [facility] / REPLACE / [place]     left carotid endarterectomy  11/2003   by Dr. Delton See   PACEMAKER INSERTION     medtronic kappa 901   removed chalazion from left eye lid  09/2011   Dr. Satira Sark    reports that she has never smoked. She has never used smokeless tobacco. She reports that she does not drink alcohol and does not use drugs. family history includes Alzheimer's disease in her father; Cancer in an other family member; Diabetes in her sister; Ehlers-Danlos syndrome in her father; Heart disease in her father and another family member; Leukemia in an other family member; Lung cancer in her mother; Mitral valve prolapse in her brother and sister; Prostate cancer in her father. Allergies  Allergen Reactions   Dapagliflozin-Saxagliptin     Other reaction(s): bradycardia   Other Other (See Comments) and Rash  All QTc prolongation drugs   Statins Other (See Comments)    Muscle aches   Lovastatin     Other reaction(s): myalgias (muscle pain)   Adhesive [Tape] Rash   Latex Rash   Current Outpatient Medications on File Prior to Visit  Medication Sig Dispense Refill   acebutolol (SECTRAL) 200 MG capsule TAKE 1 CAPSULE BY MOUTH EVERYDAY AT BEDTIME 90 capsule 3   apixaban (ELIQUIS) 5 MG TABS tablet Take 1 tablet (5 mg total) by mouth 2 (two) times daily. 180 tablet 1   citalopram (CELEXA) 20 MG tablet TAKE 1 TABLET BY MOUTH EVERY DAY 90 tablet 1   ezetimibe (ZETIA) 10 MG tablet TAKE 1 TABLET BY MOUTH DAILY. 90 tablet 3   levothyroxine (SYNTHROID) 75 MCG tablet Take 1 tablet (75 mcg total) by mouth daily before breakfast. 90 tablet 1   LORazepam (ATIVAN) 1 MG tablet Take 1 tablet (1 mg total) by mouth every 8 (eight) hours as needed. for anxiety 90 tablet 3   No current facility-administered medications on file prior to visit.         ROS:  All others reviewed and negative.  Objective        PE:  BP 112/68   Pulse 64   Temp 97.9 F (36.6 C) (Oral)   Ht '5\' 10"'$  (1.778 m)   Wt 141 lb 2 oz (64 kg)   SpO2 98%   BMI 20.25 kg/m                 Constitutional: Pt appears in NAD               HENT: Head: NCAT.                Right Ear: External ear normal.                 Left Ear: External ear normal.                Eyes: . Pupils are equal, round, and reactive to light. Conjunctivae and EOM are normal               Nose: without d/c or deformity               Neck: Neck supple. Gross normal ROM               Cardiovascular: Normal rate and regular rhythm.                 Pulmonary/Chest: Effort normal and breath sounds without rales or wheezing.                Abd:  Soft, NT, ND, + BS, no organomegaly               Neurological: Pt is alert. At baseline orientation, motor grossly intact               Skin: Skin is warm. No rashes, no other new lesions, LE edema - none but skin is pale               Psychiatric: Pt behavior is normal without agitation   Micro: none  Cardiac tracings I have personally interpreted today:  none  Pertinent Radiological findings (summarize): none   Lab Results  Component Value Date   WBC 6.0 07/28/2021   HGB 13.1 07/28/2021   HCT 38.9 07/28/2021   PLT 160.0 07/28/2021   GLUCOSE 91 06/27/2021   CHOL 173  04/25/2021   TRIG 112.0 04/25/2021   HDL 59.40 04/25/2021   LDLDIRECT 101.0 10/07/2018   LDLCALC 91 04/25/2021   ALT 13 05/13/2020   AST 16 05/13/2020   NA 133 (L) 06/27/2021   K 4.6 06/27/2021   CL 98 06/27/2021   CREATININE 0.85 06/27/2021   BUN 12 06/27/2021   CO2 29 06/27/2021   TSH 0.66 06/27/2021   INR 1.11 06/19/2017   HGBA1C 5.5 06/20/2017   Assessment/Plan:  Amy Lane is a 71 y.o. White or Caucasian [1] female with  has a past medical history of Abnormal gait, Allergic rhinitis, Anxiety, Atrial fibrillation (HCC), Carcinomas, basal cell, Cardiac  pacemaker, Cerebrovascular disease, Clotting disorder ([place]), Depression, Dysautonomia ([place]), Ehler's-Danlos syndrome, Fibromyalgia, Headache(784.0), Heart murmur, Hemorrhoids, Herpes zoster, History of syncope, History of tachycardia-bradycardia syndrome, History of transient ischemic attack, Hypercholesteremia, Hypercholesterolemia, Hypothyroidism, Long term (current) use of anticoagulants, Migraine with aura (06/21/2017), Mitral valve prolapse, PAT (paroxysmal atrial tachycardia) (HCC), PSVT (paroxysmal supraventricular tachycardia) ([place]), Skin desquamation, Stroke ([place]), and SVT (supraventricular tachycardia) ([place]).  Vitamin D deficiency Last vitamin D Lab Results  Component Value Date   VD25OH 27.36 (L) 07/28/2021   Low, to start oral replacement  Hyponatremia Chronic mild stable Lab Results  Component Value Date   NA 133 (L) 06/27/2021   K 4.6 06/27/2021   CO2 29 06/27/2021   GLUCOSE 91 06/27/2021   BUN 12 06/27/2021   CREATININE 0.85 06/27/2021   CALCIUM 9.7 06/27/2021   GFRNONAA 77 11/06/2019  d/w pt possible SIADH  Anemia Mild transient approx 3 yrs ago, for f/u lab  Followup: Return if symptoms worsen or fail to improve.  Cathlean Cower, MD 07/28/2021 7:55 PM [facility]

## 2021-08-04 ENCOUNTER — Ambulatory Visit (INDEPENDENT_AMBULATORY_CARE_PROVIDER_SITE_OTHER): Payer: Medicare Other

## 2021-08-04 DIAGNOSIS — I495 Sick sinus syndrome: Secondary | ICD-10-CM

## 2021-08-05 LAB — CUP PACEART REMOTE DEVICE CHECK
Battery Impedance: 2083 Ohm
Battery Remaining Longevity: 34 mo
Battery Voltage: 2.76 V
Brady Statistic AP VP Percent: 0 %
Brady Statistic AP VS Percent: 81 %
Brady Statistic AS VP Percent: 0 %
Brady Statistic AS VS Percent: 18 %
Date Time Interrogation Session: 20230713203737
Implantable Pulse Generator Implant Date: 20110310
Lead Channel Impedance Value: 394 Ohm
Lead Channel Impedance Value: 647 Ohm
Lead Channel Pacing Threshold Amplitude: 0.875 V
Lead Channel Pacing Threshold Amplitude: 0.875 V
Lead Channel Pacing Threshold Pulse Width: 0.4 ms
Lead Channel Pacing Threshold Pulse Width: 0.4 ms
Lead Channel Setting Pacing Amplitude: 2 V
Lead Channel Setting Pacing Amplitude: 2.5 V
Lead Channel Setting Pacing Pulse Width: 0.4 ms
Lead Channel Setting Sensing Sensitivity: 2.8 mV

## 2021-08-19 NOTE — Progress Notes (Signed)
Remote pacemaker transmission.   

## 2021-09-30 ENCOUNTER — Other Ambulatory Visit: Payer: Self-pay | Admitting: Internal Medicine

## 2021-09-30 DIAGNOSIS — E039 Hypothyroidism, unspecified: Secondary | ICD-10-CM

## 2021-10-25 DIAGNOSIS — I440 Atrioventricular block, first degree: Secondary | ICD-10-CM | POA: Insufficient documentation

## 2021-10-26 ENCOUNTER — Ambulatory Visit (INDEPENDENT_AMBULATORY_CARE_PROVIDER_SITE_OTHER): Payer: Medicare Other

## 2021-10-26 ENCOUNTER — Encounter: Payer: Self-pay | Admitting: Internal Medicine

## 2021-10-26 ENCOUNTER — Ambulatory Visit: Payer: Medicare Other | Attending: Internal Medicine | Admitting: Internal Medicine

## 2021-10-26 VITALS — BP 107/66 | HR 62 | Ht 70.0 in | Wt 142.0 lb

## 2021-10-26 DIAGNOSIS — G90A Postural orthostatic tachycardia syndrome (POTS): Secondary | ICD-10-CM

## 2021-10-26 DIAGNOSIS — I440 Atrioventricular block, first degree: Secondary | ICD-10-CM | POA: Diagnosis not present

## 2021-10-26 DIAGNOSIS — E039 Hypothyroidism, unspecified: Secondary | ICD-10-CM

## 2021-10-26 DIAGNOSIS — I4581 Long QT syndrome: Secondary | ICD-10-CM | POA: Diagnosis not present

## 2021-10-26 DIAGNOSIS — R531 Weakness: Secondary | ICD-10-CM | POA: Diagnosis not present

## 2021-10-26 DIAGNOSIS — I480 Paroxysmal atrial fibrillation: Secondary | ICD-10-CM | POA: Diagnosis not present

## 2021-10-26 DIAGNOSIS — Z950 Presence of cardiac pacemaker: Secondary | ICD-10-CM

## 2021-10-26 NOTE — Patient Instructions (Addendum)
Medication Instructions:  Your physician recommends that you continue on your current medications as directed. Please refer to the Current Medication list given to you today.  *If you need a refill on your cardiac medications before your next appointment, please call your pharmacy*   Lab Work: TSH today  If you have labs (blood work) drawn today and your tests are completely normal, you will receive your results only by: [place] (if you have MyChart) OR A paper copy in the mail If you have any lab test that is abnormal or we need to change your treatment, we will call you to review the results.   Testing/Procedures: None ordered.    Follow-Up: At [facility], you and your health needs are our priority.  As part of our continuing mission to provide you with exceptional heart care, we have created designated Provider Care Teams.  These Care Teams include your primary Cardiologist (physician) and Advanced Practice Providers (APPs -  Physician Assistants and Nurse Practitioners) who all work together to provide you with the care you need, when you need it.  We recommend signing up for the patient portal called "MyChart".  Sign up information is provided on this After Visit Summary.  MyChart is used to connect with patients for Virtual Visits (Telemedicine).  Patients are able to view lab/test results, encounter notes, upcoming appointments, etc.  Non-urgent messages can be sent to your provider as well.   To learn more about what you can do with MyChart, go to [email or web address].    Your next appointment:   4 weeks telephone visit - Dr Olin Pia scheduler will call you to schedule.  ZIO XT- Long Term Monitor Instructions  Your physician has requested you wear a ZIO patch monitor for 14 days.  This is a single patch monitor. [employer] supplies one patch monitor per enrollment. Additional stickers are not available. Please do not apply patch if you will be having a  Nuclear Stress Test,  Echocardiogram, Cardiac CT, MRI, or Chest Xray during the period you would be wearing the  monitor. The patch cannot be worn during these tests. You cannot remove and re-apply the  ZIO XT patch monitor.  Your ZIO patch monitor will be mailed 3 day USPS to your address on file. It may take 3-5 days  to receive your monitor after you have been enrolled.  Once you have received your monitor, please review the enclosed instructions. Your monitor  has already been registered assigning a specific monitor serial # to you.  Billing and Patient Assistance Program Information  We have supplied [employer] with any of your insurance information on file for billing purposes. [employer] offers a sliding scale Patient Assistance Program for patients that do not have  insurance, or whose insurance does not completely cover the cost of the ZIO monitor.  You must apply for the Patient Assistance Program to qualify for this discounted rate.  To apply, please call [employer] at [phone], select option 4, select option 2, ask to apply for  Patient Assistance Program. Theodore Demark will ask your household income, and how many people  are in your household. They will quote your out-of-pocket cost based on that information.  [employer] will also be able to set up a 58-month interest-free payment plan if needed.  Applying the monitor   Shave hair from upper left chest.  Hold abrader disc by orange tab. Rub abrader in 40 strokes over the upper left chest as  indicated in your monitor instructions.  Clean  area with 4 enclosed alcohol pads. Let dry.  Apply patch as indicated in monitor instructions. Patch will be placed under collarbone on left  side of chest with arrow pointing upward.  Rub patch adhesive wings for 2 minutes. Remove white label marked "1". Remove the white  label marked "2". Rub patch adhesive wings for 2 additional minutes.  While looking in a mirror, press and release button in center  of patch. A small green light will  flash 3-4 times. This will be your only indicator that the monitor has been turned on.  Do not shower for the first 24 hours. You may shower after the first 24 hours.  Press the button if you feel a symptom. You will hear a small click. Record Date, Time and  Symptom in the Patient Logbook.  When you are ready to remove the patch, follow instructions on the last 2 pages of Patient  Logbook. Stick patch monitor onto the last page of Patient Logbook.  Place Patient Logbook in the blue and white box. Use locking tab on box and tape box closed  securely. The blue and white box has prepaid postage on it. Please place it in the mailbox as  soon as possible. Your physician should have your test results approximately 7 days after the  monitor has been mailed back to [facility].  Call [place] at [phone] if you have questions regarding  your ZIO XT patch monitor. Call them immediately if you see an orange light blinking on your  monitor.  If your monitor falls off in less than 4 days, contact our Monitor department at [phone].  If your monitor becomes loose or falls off after 4 days call [employer] at [phone] for  suggestions on securing your monitor  Important Information About [employer]

## 2021-10-26 NOTE — Progress Notes (Signed)
Patient Care Team: Janith Lima, MD as PCP - General (Internal Medicine) Josue Hector, MD as PCP - Cardiology (Cardiology) Deboraha Sprang, MD as Consulting Physician (Cardiology) Dingeldein, Remo Lipps, MD (Ophthalmology) Key, Nelia Shi, NP as Nurse Practitioner (Gynecology) Kathrynn Ducking, MD (Inactive) as Consulting Physician (Neurology)   HPI  Amy Lane is a 71 y.o. female is seen in followup for sinus bradycardia status post pacemaker, status post generator replacement and lead repair March 2011. History of Ehlers-Danlos syndrome in the context of a marfanoid habitus, mitral valve prolapse status post mitral valve repair at the [facility] clinic with prior TIA on chronic Coumadin as well as syncope due to diagnosis of long QT syndrome. She has had significant dysautonomic symptoms consistent with POTS but most recently has been doing well   The patient denies chest pain, shortness of breath, nocturnal dyspnea, orthopnea or The patient denies chest pain, shortness of breath, nocturnal dyspnea, orthopnea or peripheral edema.  There have been no palpitations or syncope .  Complains of intermittent spells of lightheadedness and weakness associated with some diaphoresis lasting 30 seconds to about 3 minutes.  They can occur standing or seated.  They are without residual.  They are associated with a sensation of cold that comes across her shoulders and sweeps down to her feet.   These events are occurring a couple of times a week  Date Cr K Hgb TSH  5/18 0.72  13.7 (7/18)    10/21 0.8  12.4   6/23 0.85 4.6 13.1 0.66     Past Medical History:  Diagnosis Date   Abnormal gait    Allergic rhinitis    Anxiety    Atrial fibrillation (HCC)    Carcinomas, basal cell    Cardiac pacemaker    DDD  MDT   Cerebrovascular disease    Clotting disorder ([place])    DVT   Depression    Dysautonomia ([place])    Ehler's-Danlos syndrome    Fibromyalgia    Headache(784.0)    Migraine    Heart murmur    Hemorrhoids    Herpes zoster    History of syncope    History of tachycardia-bradycardia syndrome    History of transient ischemic attack    Hypercholesteremia    Hypercholesterolemia    Hypothyroidism    Long term (current) use of anticoagulants    Migraine with aura 06/21/2017   Episodes of aphasia and confusion   Mitral valve prolapse    PAT (paroxysmal atrial tachycardia)    PSVT (paroxysmal supraventricular tachycardia)    Skin desquamation    inflammative vaginitis   Stroke (HCC)    SVT (supraventricular tachycardia)     Past Surgical History:  Procedure Laterality Date   ABLATION     x 2   biopsy     gum   CAROTID ENDARTERECTOMY     complex mitral valve repair     at the [place] / REPLACE / [place]     left carotid endarterectomy  11/2003   by Dr. Delton See   PACEMAKER INSERTION     medtronic kappa 901   removed chalazion from left eye lid  09/2011   Dr. Satira Sark    Current Outpatient Medications  Medication Sig Dispense Refill   acebutolol (SECTRAL) 200 MG capsule TAKE 1 CAPSULE BY MOUTH EVERYDAY AT BEDTIME 90 capsule 3   apixaban (ELIQUIS) 5 MG  TABS tablet Take 1 tablet (5 mg total) by mouth 2 (two) times daily. 180 tablet 1   citalopram (CELEXA) 20 MG tablet TAKE 1 TABLET BY MOUTH EVERY DAY 90 tablet 1   ezetimibe (ZETIA) 10 MG tablet TAKE 1 TABLET BY MOUTH DAILY. 90 tablet 3   levothyroxine (SYNTHROID) 75 MCG tablet TAKE 1 TABLET BY MOUTH DAILY BEFORE BREAKFAST. 90 tablet 1   LORazepam (ATIVAN) 1 MG tablet Take 1 tablet (1 mg total) by mouth every 8 (eight) hours as needed. for anxiety 90 tablet 3   No current facility-administered medications for this visit.    Allergies  Allergen Reactions   Dapagliflozin-Saxagliptin     Other reaction(s): bradycardia   Other Other (See Comments) and Rash    All QTc prolongation drugs   Statins Other (See Comments)    Muscle aches   Lovastatin     Other  reaction(s): myalgias (muscle pain)   Adhesive [Tape] Rash   Latex Rash    Review of Systems negative except from HPI and PMH  Physical Exam BP 107/66 (BP Location: Right Arm, Patient Position: Standing)   Pulse 62   Ht '5\' 10"'$  (1.778 m)   Wt 142 lb (64.4 kg)   SpO2 98%   BMI 20.37 kg/m  Well developed and asthenic  in no acute distress HENT normal Neck supple with JVP-flat Clear Device pocket well healed; without hematoma or erythema.  There is no tethering  Regular rate and rhythm, no  gallop No  murmur Abd-soft with active BS No Clubbing cyanosis   edema Skin-warm and dry A & Oriented  Grossly normal sensory and motor function  ECG spikes with significant RA pacing P wave latency of about 200 ms which is present looking back at least through 10/17   Assessment and  Plan  Recurrent TIA  Mitral valve repair  1AVB-profound  Possible long QT  POTS/dysautonomia  Marfanoid habitus  Atrial fibrillation-paroxysmal  Pacemaker   Spells Intermittent spells of weakness lasting 30-120 seconds or so without residual associate with a cold sensation.  It is likely vasomotor thing and the question is is there is something triggering it and is targetable i.e. potentially a rhythm issue.  I was concerned is possibly with a latency that there could be intermittent atrial failure to capture, seen that the latency has been present for 6 years I think that is probably unlikely but an arrhythmic trigger needs to be excluded.  No bleeding continue Apixaban    We will check TSH as her TSH levels in the last year have ranged over 1000%

## 2021-10-26 NOTE — Progress Notes (Unsigned)
Enrolled for [employer] to mail a ZIO XT long term holter monitor to the patients address on file.  

## 2021-10-27 LAB — TSH: TSH: 0.285 u[IU]/mL — ABNORMAL LOW (ref 0.450–4.500)

## 2021-10-30 DIAGNOSIS — R531 Weakness: Secondary | ICD-10-CM | POA: Diagnosis not present

## 2021-10-30 DIAGNOSIS — I480 Paroxysmal atrial fibrillation: Secondary | ICD-10-CM

## 2021-10-31 ENCOUNTER — Encounter: Payer: Self-pay | Admitting: Internal Medicine

## 2021-10-31 ENCOUNTER — Telehealth: Payer: Self-pay | Admitting: Internal Medicine

## 2021-10-31 NOTE — Telephone Encounter (Signed)
Patient saw her cardiologist this morning and found out that her TSH is too low - 0.285.  Cardiologist wants her medication adjusted.  Please advise.

## 2021-11-01 ENCOUNTER — Other Ambulatory Visit: Payer: Self-pay | Admitting: Internal Medicine

## 2021-11-01 DIAGNOSIS — E039 Hypothyroidism, unspecified: Secondary | ICD-10-CM

## 2021-11-01 MED ORDER — LEVOTHYROXINE SODIUM 50 MCG PO TABS: 50.0000 ug | ORAL_TABLET | Freq: Every day | ORAL | 0 refills | Status: DC

## 2021-11-14 ENCOUNTER — Ambulatory Visit (INDEPENDENT_AMBULATORY_CARE_PROVIDER_SITE_OTHER): Payer: Medicare Other

## 2021-11-14 DIAGNOSIS — I480 Paroxysmal atrial fibrillation: Secondary | ICD-10-CM

## 2021-11-14 NOTE — Progress Notes (Deleted)
Patient: Amy Lane Date of Birth: [birth year]  Reason for Visit: Follow up History from: Patient Primary Neurologist: [name]   ASSESSMENT AND PLAN 71 y.o. year old female   29.  Transient confusion/aphasia (about 10 events since age 51, some TIA, some migraine, first event was a stroke) -Complicated migraine versus TIA -Remains on Eliquis, Zetia  HISTORY  71 year old female here for evaluation of transient confusion.  History of Ehlers-Danlos, stroke, migraine.   Patient had first stroke at age 42 years old.  At age 79 years old she had pacemaker placed.  Is also been on anticoagulation for atrial fibrillation for many years.   Since that time she has had about 10 events throughout her life consisting of unilateral numbness, weakness, facial droop, speech difficulty.  4 of these may have been TIA events.  The remaining events may have been complicated migraine variants.  Patient had previously been treated with topiramate for migraine with good results.  Patient did work very well over several years and was weaned off of topiramate several years ago.   04/16/2021 patient had another event of transient confusion, difficulty reading highway signs, difficulty expressing herself.  Husband was with her in the car.  She seems somewhat confused.  Symptoms lasted for about an hour.  She went to the hospital for evaluation and had CT of the head and other testing which were unremarkable.  EEG was normal.  Mild hyponatremia was noted.  Symptoms resolved and patient was discharged home.  Update November 15, 2021 SS:   REVIEW OF SYSTEMS: Out of a complete 14 system review of symptoms, the patient complains only of the following symptoms, and all other reviewed systems are negative.  See HPI  ALLERGIES: Allergies  Allergen Reactions   Dapagliflozin-Saxagliptin     Other reaction(s): bradycardia   Other Other (See Comments) and Rash    All QTc prolongation drugs   Statins  Other (See Comments)    Muscle aches   Lovastatin     Other reaction(s): myalgias (muscle pain)   Adhesive [Tape] Rash   Latex Rash    HOME MEDICATIONS: Outpatient Medications Prior to Visit  Medication Sig Dispense Refill   acebutolol (SECTRAL) 200 MG capsule TAKE 1 CAPSULE BY MOUTH EVERYDAY AT BEDTIME 90 capsule 3   apixaban (ELIQUIS) 5 MG TABS tablet Take 1 tablet (5 mg total) by mouth 2 (two) times daily. 180 tablet 1   citalopram (CELEXA) 20 MG tablet TAKE 1 TABLET BY MOUTH EVERY DAY 90 tablet 1   ezetimibe (ZETIA) 10 MG tablet TAKE 1 TABLET BY MOUTH DAILY. 90 tablet 3   levothyroxine (SYNTHROID) 50 MCG tablet Take 1 tablet (50 mcg total) by mouth daily. 90 tablet 0   LORazepam (ATIVAN) 1 MG tablet Take 1 tablet (1 mg total) by mouth every 8 (eight) hours as needed. for anxiety 90 tablet 3   No facility-administered medications prior to visit.    PAST MEDICAL HISTORY: Past Medical History:  Diagnosis Date   Abnormal gait    Allergic rhinitis    Anxiety    Atrial fibrillation (HCC)    Carcinomas, basal cell    Cardiac pacemaker    DDD  MDT   Cerebrovascular disease    Clotting disorder ([place])    DVT   Depression    Dysautonomia (HCC)    Ehler's-Danlos syndrome    Fibromyalgia    Headache(784.0)    Migraine   Heart murmur    Hemorrhoids  Herpes zoster    History of syncope    History of tachycardia-bradycardia syndrome    History of transient ischemic attack    Hypercholesteremia    Hypercholesterolemia    Hypothyroidism    Long term (current) use of anticoagulants    Migraine with aura 06/21/2017   Episodes of aphasia and confusion   Mitral valve prolapse    PAT (paroxysmal atrial tachycardia)    PSVT (paroxysmal supraventricular tachycardia)    Skin desquamation    inflammative vaginitis   Stroke (HCC)    SVT (supraventricular tachycardia)     PAST SURGICAL HISTORY: Past Surgical History:  Procedure Laterality Date   ABLATION     x 2   biopsy      gum   CAROTID ENDARTERECTOMY     complex mitral valve repair     at the [place] / REPLACE / [place]     left carotid endarterectomy  11/2003   by Dr. Delton See   PACEMAKER INSERTION     medtronic kappa 901   removed chalazion from left eye lid  09/2011   Dr. Satira Sark    FAMILY HISTORY: Family History  Problem Relation Age of Onset   Lung cancer Mother    Prostate cancer Father    Alzheimer's disease Father    Heart disease Father    Ehlers-Danlos syndrome Father    Mitral valve prolapse Brother    Diabetes Sister    Mitral valve prolapse Sister    Leukemia Other        Nephew   Heart disease Other        entire maternal side of family   Cancer Other        entire maternal side of family    SOCIAL HISTORY: Social History   Socioeconomic History   Marital status: Married    Spouse name: Fritz Pickerel   Number of children: 0   Years of education: S-College   Highest education level: Not on file  Occupational History   Occupation: retired    Fish farm manager: RETIRED  Tobacco Use   Smoking status: Never   Smokeless tobacco: Never   Tobacco comments:    smoked in her early 20's--smoked 4 cigs per year   Vaping Use   Vaping Use: Never used  Substance and Sexual Activity   Alcohol use: No    Alcohol/week: 0.0 standard drinks of alcohol    Comment: 2 drinks per year   Drug use: No   Sexual activity: Not on file  Other Topics Concern   Not on file  Social History Narrative   Lives with spouse   Patient drinks about 2 cups of caffeine daily.   Patient is right handed.    Social Determinants of Health   Financial Resource Strain: Low Risk  (02/08/2021)   Overall Financial Resource Strain (CARDIA)    Difficulty of Paying Living Expenses: Not hard at all  Food Insecurity: No Food Insecurity (02/08/2021)   Hunger Vital Sign    Worried About Running Out of Food in the Last Year: Never true    Ran Out of Food in the Last Year: Never  true  Transportation Needs: No Transportation Needs (02/08/2021)   PRAPARE - Hydrologist (Medical): No    Lack of Transportation (Non-Medical): No  Physical Activity: Inactive (02/08/2021)   Exercise Vital Sign    Days of Exercise per Week: 0 days  Minutes of Exercise per Session: 0 min  Stress: No Stress Concern Present (02/08/2021)   [place]    Feeling of Stress : Not at all  Social Connections: [place] (02/08/2021)   Social Connection and Isolation Panel [NHANES]    Frequency of Communication with Friends and Family: More than three times a week    Frequency of Social Gatherings with Friends and Family: More than three times a week    Attends Religious Services: More than 4 times per year    Active Member of [employer] or Organizations: No    Attends Music therapist: More than 4 times per year    Marital Status: Married  Human resources officer Violence: Not At Risk (02/08/2021)   Humiliation, Afraid, Rape, and Kick questionnaire    Fear of Current or Ex-Partner: No    Emotionally Abused: No    Physically Abused: No    Sexually Abused: No    PHYSICAL EXAM  There were no vitals filed for this visit. There is no height or weight on file to calculate BMI.  Generalized: Well developed, in no acute distress  Neurological examination  Mentation: Alert oriented to time, place, history taking. Follows all commands speech and language fluent Cranial nerve II-XII: Pupils were equal round reactive to light. Extraocular movements were full, visual field were full on confrontational test. Facial sensation and strength were normal. Uvula tongue midline. Head turning and shoulder shrug  were normal and symmetric. Motor: The motor testing reveals 5 over 5 strength of all 4 extremities. Good symmetric motor tone is noted throughout.  Sensory: Sensory testing is intact to soft touch on  all 4 extremities. No evidence of extinction is noted.  Coordination: Cerebellar testing reveals good finger-nose-finger and heel-to-shin bilaterally.  Gait and station: Gait is normal. Tandem gait is normal. Romberg is negative. No drift is seen.  Reflexes: Deep tendon reflexes are symmetric and normal bilaterally.   DIAGNOSTIC DATA (LABS, IMAGING, TESTING) - I reviewed patient records, labs, notes, testing and imaging myself where available.  Lab Results  Component Value Date   WBC 6.0 07/28/2021   HGB 13.1 07/28/2021   HCT 38.9 07/28/2021   MCV 91.2 07/28/2021   PLT 160.0 07/28/2021      Component Value Date/Time   NA 133 (L) 06/27/2021 1125   NA 136 11/06/2019 1518   K 4.6 06/27/2021 1125   CL 98 06/27/2021 1125   CO2 29 06/27/2021 1125   GLUCOSE 91 06/27/2021 1125   BUN 12 06/27/2021 1125   BUN 15 11/06/2019 1518   CREATININE 0.85 06/27/2021 1125   CALCIUM 9.7 06/27/2021 1125   PROT 7.1 05/13/2020 1539   ALBUMIN 3.9 05/13/2020 1539   AST 16 05/13/2020 1539   ALT 13 05/13/2020 1539   ALKPHOS 68 05/13/2020 1539   BILITOT 0.4 05/13/2020 1539   GFRNONAA 77 11/06/2019 1518   GFRAA 88 11/06/2019 1518   Lab Results  Component Value Date   CHOL 173 04/25/2021   HDL 59.40 04/25/2021   LDLCALC 91 04/25/2021   LDLDIRECT 101.0 10/07/2018   TRIG 112.0 04/25/2021   CHOLHDL 3 04/25/2021   Lab Results  Component Value Date   HGBA1C 5.5 06/20/2017   Lab Results  Component Value Date   VITAMINB12 285 07/28/2021   Lab Results  Component Value Date   TSH 0.285 (L) 10/26/2021    Butler Denmark, AGNP-C, DNP 11/14/2021, 9:04 PM [facility] [address],  [place], [place] [address] [phone]

## 2021-11-15 ENCOUNTER — Ambulatory Visit: Payer: Medicare Other | Admitting: Neurology

## 2021-11-15 ENCOUNTER — Encounter: Payer: Self-pay | Admitting: Neurology

## 2021-11-16 LAB — CUP PACEART REMOTE DEVICE CHECK
Battery Impedance: 2223 Ohm
Battery Remaining Longevity: 32 mo
Battery Voltage: 2.74 V
Brady Statistic AP VP Percent: 0 %
Brady Statistic AP VS Percent: 84 %
Brady Statistic AS VP Percent: 0 %
Brady Statistic AS VS Percent: 16 %
Date Time Interrogation Session: 20231023103412
Implantable Pulse Generator Implant Date: 20110310
Lead Channel Impedance Value: 411 Ohm
Lead Channel Impedance Value: 673 Ohm
Lead Channel Pacing Threshold Amplitude: 0.875 V
Lead Channel Pacing Threshold Amplitude: 1 V
Lead Channel Pacing Threshold Pulse Width: 0.4 ms
Lead Channel Pacing Threshold Pulse Width: 0.4 ms
Lead Channel Setting Pacing Amplitude: 2 V
Lead Channel Setting Pacing Amplitude: 2.5 V
Lead Channel Setting Pacing Pulse Width: 0.4 ms
Lead Channel Setting Sensing Sensitivity: 4 mV
Zone Setting Status: 755011
Zone Setting Status: 755011

## 2021-11-23 DIAGNOSIS — R531 Weakness: Secondary | ICD-10-CM | POA: Diagnosis not present

## 2021-11-23 DIAGNOSIS — I480 Paroxysmal atrial fibrillation: Secondary | ICD-10-CM | POA: Diagnosis not present

## 2021-11-24 ENCOUNTER — Telehealth: Payer: Medicare Other | Admitting: Internal Medicine

## 2021-12-05 NOTE — Progress Notes (Signed)
Remote pacemaker transmission.   

## 2021-12-08 DIAGNOSIS — Z780 Asymptomatic menopausal state: Secondary | ICD-10-CM | POA: Diagnosis not present

## 2021-12-08 DIAGNOSIS — Z01419 Encounter for gynecological examination (general) (routine) without abnormal findings: Secondary | ICD-10-CM | POA: Diagnosis not present

## 2021-12-08 DIAGNOSIS — Z1231 Encounter for screening mammogram for malignant neoplasm of breast: Secondary | ICD-10-CM | POA: Diagnosis not present

## 2021-12-09 ENCOUNTER — Encounter: Payer: Self-pay | Admitting: Internal Medicine

## 2021-12-09 ENCOUNTER — Ambulatory Visit: Payer: Medicare Other | Attending: Cardiovascular Disease | Admitting: Internal Medicine

## 2021-12-09 VITALS — BP 120/60 | Ht 71.0 in | Wt 144.7 lb

## 2021-12-09 DIAGNOSIS — I480 Paroxysmal atrial fibrillation: Secondary | ICD-10-CM

## 2021-12-09 DIAGNOSIS — I440 Atrioventricular block, first degree: Secondary | ICD-10-CM | POA: Diagnosis not present

## 2021-12-09 DIAGNOSIS — I4581 Long QT syndrome: Secondary | ICD-10-CM

## 2021-12-09 DIAGNOSIS — Z950 Presence of cardiac pacemaker: Secondary | ICD-10-CM | POA: Diagnosis not present

## 2021-12-09 NOTE — Patient Instructions (Signed)
Medication Instructions:  Your physician recommends that you continue on your current medications as directed. Please refer to the Current Medication list given to you today.  *If you need a refill on your cardiac medications before your next appointment, please call your pharmacy*  Follow-Up: At [facility], you and your health needs are our priority.  As part of our continuing mission to provide you with exceptional heart care, we have created designated Provider Care Teams.  These Care Teams include your primary Cardiologist (physician) and Advanced Practice Providers (APPs -  Physician Assistants and Nurse Practitioners) who all work together to provide you with the care you need, when you need it.  Your next appointment:   1 year(s)  The format for your next appointment:   In Person  Provider:   You may see Virl Axe, MD or one of the following Advanced Practice Providers on your designated Care Team:   Tommye Standard, [place] Legrand Como "Jonni Sanger" Chalmers Cater, [place]    Important Information About Sugar

## 2021-12-09 NOTE — Progress Notes (Signed)
A Seery send a text to break    Electrophysiology TeleHealth Note      Date:  12/09/2021   ID:  Amy Lane, DOB September 12, 1950, MRN [id number]  Location: patient's home Labs right, provider location: [address] [place]  Evaluation Performed: Follow-up visit  PCP:  Janith Lima, MD  Cardiologist:   Pni Electrophysiologist:  SK   Chief Complaint:  spells   History of Present Illness:    Amy Lane is a 71 y.o. female who presents via audio/video conferencing for a telehealth visit today.  Since last being seen in our clinic for intermittent spells of lightheadedness in the context of long QT prior POTS Ehlers-Danlos syndrome pacemaker, event recorder demonstrating multiple episodes of short nonsustained SVT but no significant symptoms associated with her triggered events the patient reports feels much better   BP recordings during the events of LH were low.       The patient denies symptoms of fevers, chills, cough, or new SOB worrisome for COVID 19.    Past Medical History:  Diagnosis Date   Abnormal gait    Allergic rhinitis    Anxiety    Atrial fibrillation (HCC)    Carcinomas, basal cell    Cardiac pacemaker    DDD  MDT   Cerebrovascular disease    Clotting disorder ([place])    DVT   Depression    Dysautonomia ([place])    Ehler's-Danlos syndrome    Fibromyalgia    Headache(784.0)    Migraine   Heart murmur    Hemorrhoids    Herpes zoster    History of syncope    History of tachycardia-bradycardia syndrome    History of transient ischemic attack    Hypercholesteremia    Hypercholesterolemia    Hypothyroidism    Long term (current) use of anticoagulants    Migraine with aura 06/21/2017   Episodes of aphasia and confusion   Mitral valve prolapse    PAT (paroxysmal atrial tachycardia)    PSVT (paroxysmal supraventricular tachycardia)    Skin desquamation    inflammative vaginitis   Stroke (HCC)    SVT (supraventricular  tachycardia)     Past Surgical History:  Procedure Laterality Date   ABLATION     x 2   biopsy     gum   CAROTID ENDARTERECTOMY     complex mitral valve repair     at the [place] / REPLACE / [place]     left carotid endarterectomy  11/2003   by Dr. Delton See   PACEMAKER INSERTION     medtronic kappa 901   removed chalazion from left eye lid  09/2011   Dr. Satira Sark    Current Outpatient Medications  Medication Sig Dispense Refill   acebutolol (SECTRAL) 200 MG capsule TAKE 1 CAPSULE BY MOUTH EVERYDAY AT BEDTIME 90 capsule 3   apixaban (ELIQUIS) 5 MG TABS tablet Take 1 tablet (5 mg total) by mouth 2 (two) times daily. 180 tablet 1   citalopram (CELEXA) 20 MG tablet TAKE 1 TABLET BY MOUTH EVERY DAY 90 tablet 1   ezetimibe (ZETIA) 10 MG tablet TAKE 1 TABLET BY MOUTH DAILY. 90 tablet 3   levothyroxine (SYNTHROID) 50 MCG tablet Take 1 tablet (50 mcg total) by mouth daily. 90 tablet 0   LORazepam (ATIVAN) 1 MG tablet Take 1 tablet (1 mg total) by mouth every 8 (eight) hours as needed. for  anxiety (Patient taking differently: Take 1 mg by mouth at bedtime. for anxiety) 90 tablet 3   No current facility-administered medications for this visit.    Allergies:   Dapagliflozin-saxagliptin, Other, Statins, Lovastatin, Adhesive [tape], and Latex   Social History:  The patient  reports that she has never smoked. She has never used smokeless tobacco. She reports that she does not drink alcohol and does not use drugs.   Family History:  The patient's   family history includes Alzheimer's disease in her father; Cancer in an other family member; Diabetes in her sister; Ehlers-Danlos syndrome in her father; Heart disease in her father and another family member; Leukemia in an other family member; Lung cancer in her mother; Mitral valve prolapse in her brother and sister; Prostate cancer in her father.   ROS:  Please see the history of present illness.   All  other systems are personally reviewed and negative.    Exam:    Vital Signs:  BP 120/60   Ht '5\' 11"'$  (1.803 m)   Wt 144 lb 11.2 oz (65.6 kg)   BMI 20.18 kg/m        Labs/Other Tests and Data Reviewed:    Recent Labs: 06/27/2021: BUN 12; Creatinine, Ser 0.85; Potassium 4.6; Sodium 133 07/28/2021: Hemoglobin 13.1; Platelets 160.0 10/26/2021: TSH 0.285   Wt Readings from Last 3 Encounters:  12/09/21 144 lb 11.2 oz (65.6 kg)  10/26/21 142 lb (64.4 kg)  07/28/21 141 lb 2 oz (64 kg)     Other studies personally reviewed: Additional studies/ records that were reviewed today include:      ASSESSMENT & PLAN:    Recurrent TIA   Mitral valve repair   1AVB-profound   Possible long QT   POTS/dysautonomia   Marfanoid habitus   Atrial fibrillation-paroxysmal   Pacemaker    Spells  Still seem to be related to low blood pressure as there is no significant arrhythmia and indeed she was recording her blood pressure during these events and both before and after could record but not during.  Discussed the importance of salt and water repletion.    Follow-up:  12 m    Current medicines are reviewed at length with the patient today.   The patient  concerns regarding her medicines.  The following changes were made today:    Labs/ tests ordered today include:  No orders of the defined types were placed in this encounter.     Today, I have spent 14 minutes with the patient with telehealth technology discussing the above.  Signed, Virl Axe, MD  12/09/2021 5:42 PM     [address] [place] [address] [phone] (office) [phone] (fax)

## 2021-12-27 ENCOUNTER — Encounter: Payer: Self-pay | Admitting: Internal Medicine

## 2021-12-27 ENCOUNTER — Ambulatory Visit (INDEPENDENT_AMBULATORY_CARE_PROVIDER_SITE_OTHER): Payer: Medicare Other | Admitting: Internal Medicine

## 2021-12-27 VITALS — BP 126/74 | HR 85 | Temp 98.2°F | Resp 16 | Ht 71.0 in | Wt 145.0 lb

## 2021-12-27 DIAGNOSIS — E538 Deficiency of other specified B group vitamins: Secondary | ICD-10-CM

## 2021-12-27 DIAGNOSIS — E871 Hypo-osmolality and hyponatremia: Secondary | ICD-10-CM | POA: Diagnosis not present

## 2021-12-27 DIAGNOSIS — Z23 Encounter for immunization: Secondary | ICD-10-CM | POA: Diagnosis not present

## 2021-12-27 DIAGNOSIS — M25562 Pain in left knee: Secondary | ICD-10-CM | POA: Diagnosis not present

## 2021-12-27 DIAGNOSIS — E559 Vitamin D deficiency, unspecified: Secondary | ICD-10-CM | POA: Diagnosis not present

## 2021-12-27 DIAGNOSIS — M810 Age-related osteoporosis without current pathological fracture: Secondary | ICD-10-CM

## 2021-12-27 DIAGNOSIS — E785 Hyperlipidemia, unspecified: Secondary | ICD-10-CM | POA: Diagnosis not present

## 2021-12-27 DIAGNOSIS — E039 Hypothyroidism, unspecified: Secondary | ICD-10-CM | POA: Diagnosis not present

## 2021-12-27 DIAGNOSIS — M79641 Pain in right hand: Secondary | ICD-10-CM | POA: Diagnosis not present

## 2021-12-27 LAB — TSH: TSH: 19.07 u[IU]/mL — ABNORMAL HIGH (ref 0.35–5.50)

## 2021-12-27 LAB — CBC WITH DIFFERENTIAL/PLATELET
Basophils Absolute: 0 10*3/uL (ref 0.0–0.1)
Basophils Relative: 0.5 % (ref 0.0–3.0)
Eosinophils Absolute: 0.1 10*3/uL (ref 0.0–0.7)
Eosinophils Relative: 1 % (ref 0.0–5.0)
HCT: 41.2 % (ref 36.0–46.0)
Hemoglobin: 14 g/dL (ref 12.0–15.0)
Lymphocytes Relative: 16 % (ref 12.0–46.0)
Lymphs Abs: 1.1 10*3/uL (ref 0.7–4.0)
MCHC: 34 g/dL (ref 30.0–36.0)
MCV: 91.4 fl (ref 78.0–100.0)
Monocytes Absolute: 0.6 10*3/uL (ref 0.1–1.0)
Monocytes Relative: 8.2 % (ref 3.0–12.0)
Neutro Abs: 5.1 10*3/uL (ref 1.4–7.7)
Neutrophils Relative %: 74.3 % (ref 43.0–77.0)
Platelets: 206 10*3/uL (ref 150.0–400.0)
RBC: 4.51 Mil/uL (ref 3.87–5.11)
RDW: 13.9 % (ref 11.5–15.5)
WBC: 6.9 10*3/uL (ref 4.0–10.5)

## 2021-12-27 LAB — HEPATIC FUNCTION PANEL
ALT: 18 U/L (ref 0–35)
AST: 22 U/L (ref 0–37)
Albumin: 4.4 g/dL (ref 3.5–5.2)
Alkaline Phosphatase: 61 U/L (ref 39–117)
Bilirubin, Direct: 0 mg/dL (ref 0.0–0.3)
Total Bilirubin: 0.4 mg/dL (ref 0.2–1.2)
Total Protein: 7.6 g/dL (ref 6.0–8.3)

## 2021-12-27 LAB — BASIC METABOLIC PANEL
BUN: 14 mg/dL (ref 6–23)
CO2: 29 mEq/L (ref 19–32)
Calcium: 9.5 mg/dL (ref 8.4–10.5)
Chloride: 98 mEq/L (ref 96–112)
Creatinine, Ser: 0.8 mg/dL (ref 0.40–1.20)
GFR: 75.21 mL/min (ref >60.00)
Glucose, Bld: 103 mg/dL — ABNORMAL HIGH (ref 70–99)
Potassium: 4.1 mEq/L (ref 3.5–5.1)
Sodium: 134 mEq/L — ABNORMAL LOW (ref 135–145)

## 2021-12-27 LAB — FOLATE: Folate: 13.6 ng/mL (ref >5.9)

## 2021-12-27 LAB — VITAMIN B12: Vitamin B-12: 653 pg/mL (ref 211–911)

## 2021-12-27 LAB — VITAMIN D 25 HYDROXY (VIT D DEFICIENCY, FRACTURES): VITD: 30.71 ng/mL (ref 30.00–100.00)

## 2021-12-27 NOTE — Patient Instructions (Signed)

## 2021-12-27 NOTE — Progress Notes (Unsigned)
Subjective:  Patient ID: Amy Lane, female    DOB: [birth year]  Age: 71 y.o. MRN: [id number]  CC: Hypothyroidism   HPI ALLESHA ARONOFF presents for f/up -  She complains of fatigue, dry skin, and constipation.  Outpatient Medications Prior to Visit  Medication Sig Dispense Refill   acebutolol (SECTRAL) 200 MG capsule TAKE 1 CAPSULE BY MOUTH EVERYDAY AT BEDTIME 90 capsule 3   apixaban (ELIQUIS) 5 MG TABS tablet Take 1 tablet (5 mg total) by mouth 2 (two) times daily. 180 tablet 1   citalopram (CELEXA) 20 MG tablet TAKE 1 TABLET BY MOUTH EVERY DAY 90 tablet 1   ezetimibe (ZETIA) 10 MG tablet TAKE 1 TABLET BY MOUTH DAILY. 90 tablet 3   LORazepam (ATIVAN) 1 MG tablet Take 1 tablet (1 mg total) by mouth every 8 (eight) hours as needed. for anxiety (Patient taking differently: Take 1 mg by mouth at bedtime. for anxiety) 90 tablet 3   levothyroxine (SYNTHROID) 50 MCG tablet Take 1 tablet (50 mcg total) by mouth daily. 90 tablet 0   No facility-administered medications prior to visit.    ROS Review of Systems  Constitutional: Negative.  Negative for chills, diaphoresis, fatigue and unexpected weight change.  HENT: Negative.    Respiratory:  Negative for cough, chest tightness, shortness of breath and wheezing.   Cardiovascular:  Negative for chest pain, palpitations and leg swelling.  Gastrointestinal:  Positive for constipation. Negative for abdominal pain, diarrhea, nausea and vomiting.  Endocrine: Positive for cold intolerance. Negative for heat intolerance.  Musculoskeletal: Negative.   Skin: Negative.   Neurological:  Positive for dizziness and light-headedness. Negative for weakness.  Hematological: Negative.  Negative for adenopathy. Does not bruise/bleed easily.  Psychiatric/Behavioral: Negative.      Objective:  BP 126/74 (BP Location: Right Arm, Patient Position: Sitting, Cuff Size: Large)   Pulse 85   Temp 98.2 F (36.8 C) (Oral)   Resp 16   Ht '5\' 11"'$  (1.803  m)   Wt 145 lb (65.8 kg)   SpO2 97%   BMI 20.22 kg/m   BP Readings from Last 3 Encounters:  12/27/21 126/74  12/09/21 120/60  10/26/21 107/66    Wt Readings from Last 3 Encounters:  12/27/21 145 lb (65.8 kg)  12/09/21 144 lb 11.2 oz (65.6 kg)  10/26/21 142 lb (64.4 kg)    Physical Exam Vitals reviewed.  Constitutional:      Appearance: She is not ill-appearing.  HENT:     Mouth/Throat:     Mouth: Mucous membranes are moist.  Eyes:     General: No scleral icterus.    Conjunctiva/sclera: Conjunctivae normal.  Neck:     Thyroid: No thyroid mass, thyromegaly or thyroid tenderness.  Cardiovascular:     Rate and Rhythm: Normal rate and regular rhythm.     Heart sounds: S1 normal and S2 normal. Heart sounds are distant. No murmur heard. Pulmonary:     Effort: Pulmonary effort is normal.     Breath sounds: No stridor. No wheezing, rhonchi or rales.  Abdominal:     General: Abdomen is flat.     Palpations: There is no mass.     Tenderness: There is no abdominal tenderness. There is no guarding.     Hernia: No hernia is present.  Musculoskeletal:     Cervical back: Neck supple.     Right lower leg: No edema.     Left lower leg: No edema.  Skin:    General:  Skin is warm.  Neurological:     General: No focal deficit present.  Psychiatric:        Mood and Affect: Mood normal.     Lab Results  Component Value Date   WBC 6.9 12/27/2021   HGB 14.0 12/27/2021   HCT 41.2 12/27/2021   PLT 206.0 12/27/2021   GLUCOSE 103 (H) 12/27/2021   CHOL 173 04/25/2021   TRIG 112.0 04/25/2021   HDL 59.40 04/25/2021   LDLDIRECT 101.0 10/07/2018   LDLCALC 91 04/25/2021   ALT 18 12/27/2021   AST 22 12/27/2021   NA 134 (L) 12/27/2021   K 4.1 12/27/2021   CL 98 12/27/2021   CREATININE 0.80 12/27/2021   BUN 14 12/27/2021   CO2 29 12/27/2021   TSH 19.07 (H) 12/27/2021   INR 1.11 06/19/2017   HGBA1C 5.5 06/20/2017    MM 3D SCREEN BREAST BILATERAL  Result Date:  02/16/2021 CLINICAL DATA:  Screening. EXAM: DIGITAL SCREENING BILATERAL MAMMOGRAM WITH TOMOSYNTHESIS AND CAD TECHNIQUE: Bilateral screening digital craniocaudal and mediolateral oblique mammograms were obtained. Bilateral screening digital breast tomosynthesis was performed. The images were evaluated with computer-aided detection. COMPARISON:  Previous exam(s). ACR Breast Density Category d: The breast tissue is extremely dense, which lowers the sensitivity of mammography FINDINGS: There are no findings suspicious for malignancy. IMPRESSION: No mammographic evidence of malignancy. A result letter of this screening mammogram will be mailed directly to the patient. RECOMMENDATION: Screening mammogram in one year. (Code:SM-B-01Y) BI-RADS CATEGORY  1: Negative. Electronically Signed   By: Nolon Nations M.D.   On: 02/16/2021 15:27    Assessment & Plan:   Kyung was seen today for hypothyroidism.  Diagnoses and all orders for this visit:  Hyponatremia- Her sodium is stable. -     Basic metabolic panel; Future -     Basic metabolic panel  Acquired hypothyroidism- Her T4 dose is subtherapeutic. -     TSH; Future -     TSH -     levothyroxine (SYNTHROID) 88 MCG tablet; Take 1 tablet (88 mcg total) by mouth daily.  B12 deficiency -     Folate; Future -     Vitamin B12; Future -     CBC with Differential/Platelet; Future -     CBC with Differential/Platelet -     Vitamin B12 -     Folate  Hyperlipidemia LDL goal <130 -     Hepatic function panel; Future -     Hepatic function panel  Osteoporosis without current pathological fracture, unspecified osteoporosis type -     VITAMIN D 25 Hydroxy (Vit-D Deficiency, Fractures); Future -     VITAMIN D 25 Hydroxy (Vit-D Deficiency, Fractures)  Vitamin D deficiency -     VITAMIN D 25 Hydroxy (Vit-D Deficiency, Fractures); Future -     VITAMIN D 25 Hydroxy (Vit-D Deficiency, Fractures)  Flu vaccine need -     Flu Vaccine QUAD High  Dose(Fluad)   I have discontinued Mardene Celeste T. Froman's levothyroxine. I am also having her start on levothyroxine. Additionally, I am having her maintain her acebutolol, ezetimibe, LORazepam, apixaban, and citalopram.  Meds ordered this encounter  Medications   levothyroxine (SYNTHROID) 88 MCG tablet    Sig: Take 1 tablet (88 mcg total) by mouth daily.    Dispense:  90 tablet    Refill:  1     Follow-up: Return in about 4 months (around 04/28/2022).  Scarlette Calico, MD

## 2021-12-28 ENCOUNTER — Other Ambulatory Visit: Payer: Self-pay | Admitting: Internal Medicine

## 2021-12-28 DIAGNOSIS — E039 Hypothyroidism, unspecified: Secondary | ICD-10-CM

## 2021-12-28 MED ORDER — LEVOTHYROXINE SODIUM 88 MCG PO TABS: 88.0000 ug | ORAL_TABLET | Freq: Every day | ORAL | 1 refills | Status: DC

## 2022-01-03 ENCOUNTER — Other Ambulatory Visit: Payer: Self-pay | Admitting: Internal Medicine

## 2022-01-03 DIAGNOSIS — F411 Generalized anxiety disorder: Secondary | ICD-10-CM

## 2022-01-04 ENCOUNTER — Other Ambulatory Visit: Payer: Self-pay | Admitting: Cardiovascular Disease

## 2022-01-04 DIAGNOSIS — I480 Paroxysmal atrial fibrillation: Secondary | ICD-10-CM

## 2022-01-04 NOTE — Telephone Encounter (Signed)
Prescription refill request for Eliquis received. Indication: Afib  Last office visit: 12/09/21 Caryl Comes)  Scr: 0.80(12/27/21)  Age: 71 Weight: 65.8kg  Appropriate dose and refill sent to requested pharmacy.

## 2022-01-10 DIAGNOSIS — M79641 Pain in right hand: Secondary | ICD-10-CM | POA: Diagnosis not present

## 2022-02-10 ENCOUNTER — Ambulatory Visit: Payer: Medicare Other

## 2022-02-13 ENCOUNTER — Ambulatory Visit: Payer: Medicare Other | Attending: Internal Medicine

## 2022-02-13 DIAGNOSIS — I495 Sick sinus syndrome: Secondary | ICD-10-CM | POA: Diagnosis not present

## 2022-02-14 LAB — CUP PACEART REMOTE DEVICE CHECK
Battery Impedance: 2259 Ohm
Battery Remaining Longevity: 31 mo
Battery Voltage: 2.75 V
Brady Statistic AP VP Percent: 0 %
Brady Statistic AP VS Percent: 83 %
Brady Statistic AS VP Percent: 0 %
Brady Statistic AS VS Percent: 17 %
Date Time Interrogation Session: 20240122093833
Implantable Pulse Generator Implant Date: 20110310
Lead Channel Impedance Value: 404 Ohm
Lead Channel Impedance Value: 621 Ohm
Lead Channel Pacing Threshold Amplitude: 1 V
Lead Channel Pacing Threshold Amplitude: 1 V
Lead Channel Pacing Threshold Pulse Width: 0.4 ms
Lead Channel Pacing Threshold Pulse Width: 0.4 ms
Lead Channel Setting Pacing Amplitude: 2 V
Lead Channel Setting Pacing Amplitude: 2.5 V
Lead Channel Setting Pacing Pulse Width: 0.4 ms
Lead Channel Setting Sensing Sensitivity: 4 mV
Zone Setting Status: 755011
Zone Setting Status: 755011

## 2022-02-28 ENCOUNTER — Ambulatory Visit: Payer: Medicare Other | Admitting: Internal Medicine

## 2022-02-28 ENCOUNTER — Ambulatory Visit (INDEPENDENT_AMBULATORY_CARE_PROVIDER_SITE_OTHER): Payer: Medicare Other

## 2022-02-28 ENCOUNTER — Other Ambulatory Visit: Payer: Self-pay | Admitting: Cardiovascular Disease

## 2022-02-28 ENCOUNTER — Other Ambulatory Visit: Payer: Self-pay | Admitting: Internal Medicine

## 2022-02-28 VITALS — BP 116/62 | HR 80 | Temp 97.4°F | Ht 71.0 in | Wt 146.2 lb

## 2022-02-28 DIAGNOSIS — Z0000 Encounter for general adult medical examination without abnormal findings: Secondary | ICD-10-CM | POA: Diagnosis not present

## 2022-02-28 DIAGNOSIS — E039 Hypothyroidism, unspecified: Secondary | ICD-10-CM

## 2022-02-28 NOTE — Progress Notes (Signed)
Subjective:   Amy Lane is a 72 y.o. female who presents for Medicare Annual (Subsequent) preventive examination.  Review of Systems     Cardiac Risk Factors include: advanced age (>96mn, >>100women);dyslipidemia;family history of premature cardiovascular disease     Objective:    Today's Vitals   02/28/22 1425  BP: 116/62  Pulse: 80  Temp: (!) 97.4 F (36.3 C)  SpO2: 97%  Weight: 146 lb 3.2 oz (66.3 kg)  Height: '5\' 11"'$  (1.803 m)  PainSc: 0-No pain   Body mass index is 20.39 kg/m.     02/28/2022    2:40 PM 02/08/2021    1:08 PM 12/09/2019   11:20 AM 01/03/2019    8:55 AM 12/04/2018    2:41 PM 04/27/2016   12:21 PM 12/11/2014   11:58 AM  Advanced Directives  Does Patient Have a Medical Advance Directive? Yes Yes Yes No Yes No Yes  Type of [place]of [place] Living will;Healthcare Power of [place] will  [place] will  [place] Does patient want to make changes to medical advance directive?  No - Patient declined   No - Patient declined    Copy of [place] Chart? No - copy requested No - copy requested No - copy requested  No - copy requested    Would patient like information on creating a medical advance directive?      No - Patient declined     Current Medications (verified) Outpatient Encounter Medications as of 02/28/2022  Medication Sig   acebutolol (SECTRAL) 200 MG capsule TAKE 1 CAPSULE BY MOUTH EVERYDAY AT BEDTIME   citalopram (CELEXA) 20 MG tablet TAKE 1 TABLET BY MOUTH EVERY DAY   ELIQUIS 5 MG TABS tablet TAKE 1 TABLET BY MOUTH TWICE A DAY   ezetimibe (ZETIA) 10 MG tablet TAKE 1 TABLET BY MOUTH DAILY.   levothyroxine (SYNTHROID) 88 MCG tablet Take 1 tablet (88 mcg total) by mouth daily.   LORazepam (ATIVAN) 1 MG tablet TAKE 1 TABLET (1 MG TOTAL) BY MOUTH EVERY 8 (EIGHT) HOURS AS NEEDED FOR ANXIETY   No  facility-administered encounter medications on file as of 02/28/2022.    Allergies (verified) Dapagliflozin-saxagliptin, Other, Statins, Lovastatin, Adhesive [tape], and Latex   History: Past Medical History:  Diagnosis Date   Abnormal gait    Allergic rhinitis    Anxiety    Atrial fibrillation (HCC)    Carcinomas, basal cell    Cardiac pacemaker    DDD  MDT   Cerebrovascular disease    [place] ([place]    DVT   Depression    Dysautonomia ([place]    Ehler's-Danlos syndrome    Fibromyalgia    Headache(784.0)    Migraine   Heart murmur    Hemorrhoids    Herpes zoster    History of syncope    History of tachycardia-bradycardia syndrome    History of transient ischemic attack    Hypercholesteremia    Hypercholesterolemia    Hypothyroidism    Long term (current) use of anticoagulants    Migraine with aura 06/21/2017   Episodes of aphasia and confusion   Mitral valve prolapse    PAT (paroxysmal atrial tachycardia)    PSVT (paroxysmal supraventricular tachycardia)    Skin desquamation    inflammative vaginitis   Stroke ([facility]    SVT (supraventricular tachycardia)    Past Surgical History:  Procedure Laterality  Date   ABLATION     x 2   biopsy     gum   CAROTID ENDARTERECTOMY     complex mitral valve repair     at the [facility] / REPLACE / [place]     left carotid endarterectomy  11/2003   by Dr. Delton See   PACEMAKER INSERTION     medtronic kappa 901   removed chalazion from left eye lid  09/2011   Dr. Satira Sark   Family History  Problem Relation Age of Onset   Lung cancer Mother    Prostate cancer Father    Alzheimer's disease Father    Heart disease Father    Ehlers-Danlos syndrome Father    Mitral valve prolapse Brother    Diabetes Sister    Mitral valve prolapse Sister    Leukemia Other        Nephew   Heart disease Other        entire maternal side of family   Cancer Other        entire maternal side of  family   Social History   Socioeconomic History   Marital status: Married    Spouse name: Fritz Pickerel   Number of children: 0   Years of education: S-College   Highest education level: Not on file  Occupational History   Occupation: retired    Fish farm manager: RETIRED  Tobacco Use   Smoking status: Never   Smokeless tobacco: Never   Tobacco comments:    smoked in her early 20's--smoked 4 cigs per year   Vaping Use   Vaping Use: Never used  Substance and Sexual Activity   Alcohol use: No    Alcohol/week: 0.0 standard drinks of alcohol    Comment: 2 drinks per year   Drug use: No   Sexual activity: Not on file  Other Topics Concern   Not on file  Social History Narrative   Lives with spouse   Patient drinks about 2 cups of caffeine daily.   Patient is right handed.    Social Determinants of Health   Financial Resource Strain: Low Risk  (02/28/2022)   Overall Financial Resource Strain (CARDIA)    Difficulty of Paying Living Expenses: Not hard at all  Food Insecurity: No Food Insecurity (02/28/2022)   Hunger Vital Sign    Worried About Running Out of Food in the Last Year: Never true    Ran Out of Food in the Last Year: Never true  Transportation Needs: No Transportation Needs (02/28/2022)   PRAPARE - Hydrologist (Medical): No    Lack of Transportation (Non-Medical): No  Physical Activity: Inactive (02/28/2022)   Exercise Vital Sign    Days of Exercise per Week: 0 days    Minutes of Exercise per Session: 0 min  Stress: No Stress Concern Present (02/28/2022)   [place]    Feeling of Stress : Not at all  Social Connections: [place] (02/28/2022)   Social Connection and Isolation Panel [NHANES]    Frequency of Communication with Friends and Family: More than three times a week    Frequency of Social Gatherings with Friends and Family: More than three times a week    Attends Religious  Services: More than 4 times per year    Active Member of [employer] or Organizations: No    Attends Music therapist: More  than 4 times per year    Marital Status: Married    Tobacco Counseling Counseling given: Not Answered Tobacco comments: smoked in her early 20's--smoked 4 cigs per year    Clinical Intake:  Pre-visit preparation completed: Yes  Pain : No/denies pain Pain Score: 0-No pain     Nutritional Risks: None Diabetes: No  How often do you need to have someone help you when you read instructions, pamphlets, or other written materials from your doctor or pharmacy?: 1 - Never What is the last grade level you completed in school?: HSG  Diabetic? No  Interpreter Needed?: No  Information entered by :: Lisette Abu, LPN.   Activities of Daily Living    02/28/2022    3:11 PM  In your present state of health, do you have any difficulty performing the following activities:  Hearing? 0  Vision? 0  Difficulty concentrating or making decisions? 0  Walking or climbing stairs? 0  Dressing or bathing? 0  Doing errands, shopping? 0  Preparing Food and eating ? N  Using the Toilet? N  In the past six months, have you accidently leaked urine? N  Do you have problems with loss of bowel control? N  Managing your Medications? N  Managing your Finances? N  Housekeeping or managing your Housekeeping? N    Patient Care Team: Janith Lima, MD as PCP - General (Internal Medicine) Josue Hector, MD as PCP - Cardiology (Cardiology) Deboraha Sprang, MD as Consulting Physician (Cardiology) Dingeldein, Remo Lipps, MD (Ophthalmology) Key, Nelia Shi, NP as Nurse Practitioner (Gynecology) Kathrynn Ducking, MD (Inactive) as Consulting Physician (Neurology)  Indicate any recent Medical Services you may have received from other than [facility] providers in the past year (date may be approximate).     Assessment:   This is a routine wellness examination for  [place].  Hearing/Vision screen Hearing Screening - Comments:: Denies hearing difficulties   Vision Screening - Comments:: Wears rx glasses - up to date with routine eye exams with [facility] (Dr. Olena Leatherwood)   Dietary issues and exercise activities discussed: Current Exercise Habits: The patient does not participate in regular exercise at present, Exercise limited by: cardiac condition(s)   Goals Addressed             This Visit's Progress    My healthcare goal for 2024 is to be more physically active in the gym and doing more yard work.        Depression Screen    02/28/2022    2:30 PM 07/28/2021    3:33 PM 02/08/2021   12:58 PM 12/09/2019   11:24 AM 02/17/2019    3:27 PM 12/04/2018    2:43 PM 06/30/2016   11:31 AM  PHQ 2/9 Scores  PHQ - 2 Score 0 0 0 0 0 0 0  PHQ- 9 Score  0         Fall Risk    02/28/2022    3:11 PM 07/28/2021    3:34 PM 02/08/2021   12:59 PM 11/15/2020   11:08 AM 12/09/2019   11:22 AM  Fall Risk   Falls in the past year? 0 0 0 0 0  Number falls in past yr: 0  0  0  Injury with Fall? 0 0 0  0  Risk for fall due to : No Fall Risks  No Fall Risks    Follow up Falls prevention discussed  Falls evaluation completed  Falls prevention discussed    FALL  RISK PREVENTION PERTAINING TO THE HOME:  Any stairs in or around the home? Yes  If so, are there any without handrails? No  Home free of loose throw rugs in walkways, pet beds, electrical cords, etc? Yes  Adequate lighting in your home to reduce risk of falls? Yes   ASSISTIVE DEVICES UTILIZED TO PREVENT FALLS:  Life alert? No  Use of a cane, walker or w/c? No  Grab bars in the bathroom? Yes  Shower chair or bench in shower? Yes  Elevated toilet seat or a handicapped toilet? Yes   TIMED UP AND GO:  Was the test performed? Yes .  Length of time to ambulate 10 feet: 8 sec.   Gait steady and fast without use of assistive device  Cognitive Function:    06/21/2017   10:57 AM 06/20/2016    12:00 PM 12/23/2015   10:19 AM 06/15/2015   11:09 AM 12/11/2014   12:04 PM  MMSE - Mini Mental State Exam  Orientation to time '5 4 4 5 4  '$ Orientation to Place '5 5 5 5 5  '$ Registration '3 3 3 3 3  '$ Attention/ Calculation '4 5 5 5 5  '$ Recall '3 2 2 2 3  '$ Language- name 2 objects '2 2 2 2 2  '$ Language- repeat '1 1 1 1 1  '$ Language- follow 3 step command '3 3 3 3 3  '$ Language- read & follow direction '1 1 1 1 1  '$ Write a sentence '1 1 1 1 1  '$ Copy design '1 1 1 1 1  '$ Total score '29 28 28 29 29        '$ 02/28/2022    3:11 PM  6CIT Screen  What Year? 0 points  What month? 0 points  What time? 0 points  Count back from 20 0 points  Months in reverse 0 points  Repeat phrase 0 points  Total Score 0 points    Immunizations Immunization History  Administered Date(s) Administered   Fluad Quad(high Dose 65+) 10/07/2018, 12/16/2019, 11/13/2020, 12/27/2021   Influenza Split 11/21/2010, 11/21/2011   Influenza Whole 02/14/2007, 10/16/2007, 10/13/2009   Influenza, High Dose Seasonal PF 12/12/2017   Influenza,inj,Quad PF,6+ Mos 11/19/2012, 10/06/2013, 11/30/2014, 11/29/2015, 10/03/2016   PFIZER(Purple Top)SARS-COV-2 Vaccination 02/28/2019, 03/26/2019, 11/04/2019   Pneumococcal Conjugate-13 05/31/2015   Pneumococcal Polysaccharide-23 01/24/2003, 01/07/2018   Td 04/16/2009   Tdap 05/13/2020   Zoster Recombinat (Shingrix) 08/31/2021    TDAP status: Up to date  Flu Vaccine status: Up to date  Pneumococcal vaccine status: Up to date  Covid-19 vaccine status: Completed vaccines  Qualifies for Shingles Vaccine? Yes   Zostavax completed No   Shingrix Completed?: Yes  Screening Tests Health Maintenance  Topic Date Due   COVID-19 Vaccine (4 - 2023-24 season) 09/23/2021   Zoster Vaccines- Shingrix (2 of 2) 10/26/2021   MAMMOGRAM  02/17/2023   Medicare Annual Wellness (AWV)  03/01/2023   COLONOSCOPY (Pts 45-86yr Insurance coverage will need to be confirmed)  01/05/2028   DTaP/Tdap/Td (3 - Td or Tdap)  05/14/2030   Pneumonia Vaccine 70 Years old  Completed   INFLUENZA VACCINE  Completed   DEXA SCAN  Completed   Hepatitis C Screening  Completed   HPV VACCINES  Aged Out    Health Maintenance  Health Maintenance Due  Topic Date Due   COVID-19 Vaccine (4 - 2023-24 season) 09/23/2021   Zoster Vaccines- Shingrix (2 of 2) 10/26/2021    Colorectal cancer screening: Type of screening: Colonoscopy. Completed 01/04/2018. Repeat every  10 years  Mammogram status: Completed 02/16/2021. Repeat every year  Bone Density status: Completed 01/02/2019. Results reflect: Bone density results: OSTEOPENIA. Repeat every 2-3 years.  Lung Cancer Screening: (Low Dose CT Chest recommended if Age 60-80 years, 30 pack-year currently smoking OR have quit w/in 15years.) does not qualify.   Lung Cancer Screening Referral: no  Additional Screening:  Hepatitis C Screening: does qualify; Completed 06/12/2016  Vision Screening: Recommended annual ophthalmology exams for early detection of glaucoma and other disorders of the eye. Is the patient up to date with their annual eye exam?  Yes  Who is the provider or what is the name of the office in which the patient attends annual eye exams? Rondall Allegra, MD. If pt is not established with a provider, would they like to be referred to a provider to establish care? No .   Dental Screening: Recommended annual dental exams for proper oral hygiene  Community Resource Referral / Chronic Care Management: CRR required this visit?  No   CCM required this visit?  No      Plan:     I have personally reviewed and noted the following in the patient's chart:   Medical and social history Use of alcohol, tobacco or illicit drugs  Current medications and supplements including opioid prescriptions. Patient is not currently taking opioid prescriptions. Functional ability and status Nutritional status Physical activity Advanced directives List of other  physicians Hospitalizations, surgeries, and ER visits in previous 12 months Vitals Screenings to include cognitive, depression, and falls Referrals and appointments  In addition, I have reviewed and discussed with patient certain preventive protocols, quality metrics, and best practice recommendations. A written personalized care plan for preventive services as well as general preventive health recommendations were provided to patient.     Sheral Flow, LPN   07/30/2421   Nurse Notes:  Normal cognitive status assessed by direct observation by this Nurse Health Advisor. No abnormalities found.

## 2022-02-28 NOTE — Patient Instructions (Signed)
Amy Lane , Thank you for taking time to come for your Medicare Wellness Visit. I appreciate your ongoing commitment to your health goals. Please review the following plan we discussed and let me know if I can assist you in the future.   These are the goals we discussed:  Goals      My healthcare goal for 2024 is to be more physically active in the gym and doing more yard work.        This is a list of the screening recommended for you and due dates:  Health Maintenance  Topic Date Due   COVID-19 Vaccine (4 - 2023-24 season) 09/23/2021   Zoster (Shingles) Vaccine (2 of 2) 10/26/2021   Mammogram  02/17/2023   Medicare Annual Wellness Visit  03/01/2023   Colon Cancer Screening  01/05/2028   DTaP/Tdap/Td vaccine (3 - Td or Tdap) 05/14/2030   Pneumonia Vaccine  Completed   Flu Shot  Completed   DEXA scan (bone density measurement)  Completed   Hepatitis C Screening: USPSTF Recommendation to screen - Ages 68-79 yo.  Completed   HPV Vaccine  Aged Out    Advanced directives: Yes  Conditions/risks identified: Yes  Next appointment: Follow up in one year for your annual wellness visit.   Preventive Care 18 Years and Older, Female Preventive care refers to lifestyle choices and visits with your health care provider that can promote health and wellness. What does preventive care include? A yearly physical exam. This is also called an annual well check. Dental exams once or twice a year. Routine eye exams. Ask your health care provider how often you should have your eyes checked. Personal lifestyle choices, including: Daily care of your teeth and gums. Regular physical activity. Eating a healthy diet. Avoiding tobacco and drug use. Limiting alcohol use. Practicing safe sex. Taking low-dose aspirin every day. Taking vitamin and mineral supplements as recommended by your health care provider. What happens during an annual well check? The services and screenings done by your health  care provider during your annual well check will depend on your age, overall health, lifestyle risk factors, and family history of disease. Counseling  Your health care provider may ask you questions about your: Alcohol use. Tobacco use. Drug use. Emotional well-being. Home and relationship well-being. Sexual activity. Eating habits. History of falls. Memory and ability to understand (cognition). Work and work Statistician. Reproductive health. Screening  You may have the following tests or measurements: Height, weight, and BMI. Blood pressure. Lipid and cholesterol levels. These may be checked every 5 years, or more frequently if you are over 62 years old. Skin check. Lung cancer screening. You may have this screening every year starting at age 49 if you have a 30-pack-year history of smoking and currently smoke or have quit within the past 15 years. Fecal occult blood test (FOBT) of the stool. You may have this test every year starting at age 67. Flexible sigmoidoscopy or colonoscopy. You may have a sigmoidoscopy every 5 years or a colonoscopy every 10 years starting at age 54. Hepatitis C blood test. Hepatitis B blood test. Sexually transmitted disease (STD) testing. Diabetes screening. This is done by checking your blood sugar (glucose) after you have not eaten for a while (fasting). You may have this done every 1-3 years. Bone density scan. This is done to screen for osteoporosis. You may have this done starting at age 61. Mammogram. This may be done every 1-2 years. Talk to your health care provider  about how often you should have regular mammograms. Talk with your health care provider about your test results, treatment options, and if necessary, the need for more tests. Vaccines  Your health care provider may recommend certain vaccines, such as: Influenza vaccine. This is recommended every year. Tetanus, diphtheria, and acellular pertussis (Tdap, Td) vaccine. You may need a Td  booster every 10 years. Zoster vaccine. You may need this after age 27. Pneumococcal 13-valent conjugate (PCV13) vaccine. One dose is recommended after age 68. Pneumococcal polysaccharide (PPSV23) vaccine. One dose is recommended after age 63. Talk to your health care provider about which screenings and vaccines you need and how often you need them. This information is not intended to replace advice given to you by your health care provider. Make sure you discuss any questions you have with your health care provider. Document Released: 02/05/2015 Document Revised: 09/29/2015 Document Reviewed: 11/10/2014 Elsevier Interactive Patient Education  2017 [place] Prevention in the Home Falls can cause injuries. They can happen to people of all ages. There are many things you can do to make your home safe and to help prevent falls. What can I do on the outside of my home? Regularly fix the edges of walkways and driveways and fix any cracks. Remove anything that might make you trip as you walk through a door, such as a raised step or threshold. Trim any bushes or trees on the path to your home. Use bright outdoor lighting. Clear any walking paths of anything that might make someone trip, such as rocks or tools. Regularly check to see if handrails are loose or broken. Make sure that both sides of any steps have handrails. Any raised decks and porches should have guardrails on the edges. Have any leaves, snow, or ice cleared regularly. Use sand or salt on walking paths during winter. Clean up any spills in your garage right away. This includes oil or grease spills. What can I do in the bathroom? Use night lights. Install grab bars by the toilet and in the tub and shower. Do not use towel bars as grab bars. Use non-skid mats or decals in the tub or shower. If you need to sit down in the shower, use a plastic, non-slip stool. Keep the floor dry. Clean up any water that spills on the floor  as soon as it happens. Remove soap buildup in the tub or shower regularly. Attach bath mats securely with double-sided non-slip rug tape. Do not have throw rugs and other things on the floor that can make you trip. What can I do in the bedroom? Use night lights. Make sure that you have a light by your bed that is easy to reach. Do not use any sheets or blankets that are too big for your bed. They should not hang down onto the floor. Have a firm chair that has side arms. You can use this for support while you get dressed. Do not have throw rugs and other things on the floor that can make you trip. What can I do in the kitchen? Clean up any spills right away. Avoid walking on wet floors. Keep items that you use a lot in easy-to-reach places. If you need to reach something above you, use a strong step stool that has a grab bar. Keep electrical cords out of the way. Do not use floor polish or wax that makes floors slippery. If you must use wax, use non-skid floor wax. Do not have throw rugs and  other things on the floor that can make you trip. What can I do with my stairs? Do not leave any items on the stairs. Make sure that there are handrails on both sides of the stairs and use them. Fix handrails that are broken or loose. Make sure that handrails are as long as the stairways. Check any carpeting to make sure that it is firmly attached to the stairs. Fix any carpet that is loose or worn. Avoid having throw rugs at the top or bottom of the stairs. If you do have throw rugs, attach them to the floor with carpet tape. Make sure that you have a light switch at the top of the stairs and the bottom of the stairs. If you do not have them, ask someone to add them for you. What else can I do to help prevent falls? Wear shoes that: Do not have high heels. Have rubber bottoms. Are comfortable and fit you well. Are closed at the toe. Do not wear sandals. If you use a stepladder: Make sure that it is  fully opened. Do not climb a closed stepladder. Make sure that both sides of the stepladder are locked into place. Ask someone to hold it for you, if possible. Clearly mark and make sure that you can see: Any grab bars or handrails. First and last steps. Where the edge of each step is. Use tools that help you move around (mobility aids) if they are needed. These include: Canes. Walkers. Scooters. Crutches. Turn on the lights when you go into a dark area. Replace any light bulbs as soon as they burn out. Set up your furniture so you have a clear path. Avoid moving your furniture around. If any of your floors are uneven, fix them. If there are any pets around you, be aware of where they are. Review your medicines with your doctor. Some medicines can make you feel dizzy. This can increase your chance of falling. Ask your doctor what other things that you can do to help prevent falls. This information is not intended to replace advice given to you by your health care provider. Make sure you discuss any questions you have with your health care provider. Document Released: 11/05/2008 Document Revised: 06/17/2015 Document Reviewed: 02/13/2014 Elsevier Interactive Patient Education  2017 [employer].

## 2022-03-01 ENCOUNTER — Encounter: Payer: Self-pay | Admitting: Internal Medicine

## 2022-03-01 ENCOUNTER — Ambulatory Visit (INDEPENDENT_AMBULATORY_CARE_PROVIDER_SITE_OTHER): Payer: Medicare Other | Admitting: Internal Medicine

## 2022-03-01 VITALS — BP 122/72 | HR 82 | Temp 98.2°F | Ht 71.0 in | Wt 148.0 lb

## 2022-03-01 DIAGNOSIS — K5904 Chronic idiopathic constipation: Secondary | ICD-10-CM | POA: Diagnosis not present

## 2022-03-01 DIAGNOSIS — E039 Hypothyroidism, unspecified: Secondary | ICD-10-CM | POA: Diagnosis not present

## 2022-03-01 DIAGNOSIS — E871 Hypo-osmolality and hyponatremia: Secondary | ICD-10-CM

## 2022-03-01 LAB — BASIC METABOLIC PANEL
BUN: 11 mg/dL (ref 6–23)
CO2: 28 mEq/L (ref 19–32)
Calcium: 9.2 mg/dL (ref 8.4–10.5)
Chloride: 100 mEq/L (ref 96–112)
Creatinine, Ser: 0.77 mg/dL (ref 0.40–1.20)
GFR: 78.64 mL/min (ref >60.00)
Glucose, Bld: 101 mg/dL — ABNORMAL HIGH (ref 70–99)
Potassium: 4.5 mEq/L (ref 3.5–5.1)
Sodium: 135 mEq/L (ref 135–145)

## 2022-03-01 LAB — MAGNESIUM: Magnesium: 2 mg/dL (ref 1.5–2.5)

## 2022-03-01 LAB — TSH: TSH: 0.24 u[IU]/mL — ABNORMAL LOW (ref 0.35–5.50)

## 2022-03-01 MED ORDER — TRULANCE 3 MG PO TABS: 1.0000 | ORAL_TABLET | Freq: Every day | ORAL | 1 refills | Status: DC

## 2022-03-01 NOTE — Patient Instructions (Signed)

## 2022-03-01 NOTE — Progress Notes (Signed)
Subjective:  Patient ID: Amy Lane, female    DOB: 1950-07-22  Age: 72 y.o. MRN: [id number]  CC: Hypothyroidism   HPI PAIZLEY RAMELLA presents for f/up -  She has had chronic constipation for years but the for the last 4 months it is worsened.  She has a bowel movement about every 5 days.  She has not gotten much symptom relief with Ex-Lax so the night prior to this appointment she tried MiraLAX and finally had a bowel movement.  She complains of bloating, chills, and irritability but she denies nausea, vomiting, bloody stool, melena, loss of appetite, or weight loss.  Outpatient Medications Prior to Visit  Medication Sig Dispense Refill   acebutolol (SECTRAL) 200 MG capsule TAKE 1 CAPSULE BY MOUTH EVERYDAY AT BEDTIME 90 capsule 3   citalopram (CELEXA) 20 MG tablet TAKE 1 TABLET BY MOUTH EVERY DAY 90 tablet 1   ELIQUIS 5 MG TABS tablet TAKE 1 TABLET BY MOUTH TWICE A DAY 180 tablet 1   ezetimibe (ZETIA) 10 MG tablet TAKE 1 TABLET BY MOUTH DAILY. 90 tablet 3   levothyroxine (SYNTHROID) 88 MCG tablet Take 1 tablet (88 mcg total) by mouth daily. 90 tablet 1   LORazepam (ATIVAN) 1 MG tablet TAKE 1 TABLET (1 MG TOTAL) BY MOUTH EVERY 8 (EIGHT) HOURS AS NEEDED FOR ANXIETY 90 tablet 2   No facility-administered medications prior to visit.    ROS Review of Systems  Constitutional:  Positive for chills. Negative for appetite change, diaphoresis, fatigue and fever.  HENT: Negative.    Eyes: Negative.   Respiratory:  Negative for cough, chest tightness, shortness of breath and wheezing.   Cardiovascular:  Negative for chest pain, palpitations and leg swelling.  Gastrointestinal:  Positive for constipation and nausea. Negative for abdominal pain, blood in stool, diarrhea and vomiting.  Endocrine: Negative.   Genitourinary: Negative.  Negative for difficulty urinating.  Musculoskeletal: Negative.   Skin: Negative.   Neurological:  Negative for dizziness, weakness and light-headedness.   Hematological:  Negative for adenopathy. Does not bruise/bleed easily.  Psychiatric/Behavioral: Negative.  Negative for sleep disturbance. The patient is not nervous/anxious.     Objective:  BP 122/72 (BP Location: Left Arm, Patient Position: Sitting, Cuff Size: Large)   Pulse 82   Temp 98.2 F (36.8 C) (Oral)   Ht '5\' 11"'$  (1.803 m)   Wt 148 lb (67.1 kg)   SpO2 96%   BMI 20.64 kg/m   BP Readings from Last 3 Encounters:  03/01/22 122/72  02/28/22 116/62  12/27/21 126/74    Wt Readings from Last 3 Encounters:  03/01/22 148 lb (67.1 kg)  02/28/22 146 lb 3.2 oz (66.3 kg)  12/27/21 145 lb (65.8 kg)    Physical Exam Vitals reviewed.  Constitutional:      Appearance: She is not ill-appearing.  HENT:     Mouth/Throat:     Mouth: Mucous membranes are moist.  Eyes:     General: No scleral icterus.    Pupils: Pupils are equal, round, and reactive to light.  Cardiovascular:     Rate and Rhythm: Normal rate and regular rhythm.     Heart sounds: No murmur heard. Pulmonary:     Effort: Pulmonary effort is normal.     Breath sounds: No stridor. No wheezing, rhonchi or rales.  Abdominal:     General: Abdomen is flat. Bowel sounds are decreased. There is no distension.     Palpations: Abdomen is soft. There is no hepatomegaly,  splenomegaly or mass.     Tenderness: There is no abdominal tenderness. There is no guarding or rebound.     Hernia: No hernia is present.  Musculoskeletal:        General: Normal range of motion.     Cervical back: Neck supple.     Right lower leg: No edema.     Left lower leg: No edema.  Lymphadenopathy:     Cervical: No cervical adenopathy.  Skin:    General: Skin is warm and dry.  Neurological:     General: No focal deficit present.     Mental Status: She is alert.  Psychiatric:        Mood and Affect: Mood normal.        Behavior: Behavior normal.     Lab Results  Component Value Date   WBC 6.9 12/27/2021   HGB 14.0 12/27/2021   HCT  41.2 12/27/2021   PLT 206.0 12/27/2021   GLUCOSE 101 (H) 03/01/2022   CHOL 173 04/25/2021   TRIG 112.0 04/25/2021   HDL 59.40 04/25/2021   LDLDIRECT 101.0 10/07/2018   LDLCALC 91 04/25/2021   ALT 18 12/27/2021   AST 22 12/27/2021   NA 135 03/01/2022   K 4.5 03/01/2022   CL 100 03/01/2022   CREATININE 0.77 03/01/2022   BUN 11 03/01/2022   CO2 28 03/01/2022   TSH 0.24 (L) 03/01/2022   INR 1.11 06/19/2017   HGBA1C 5.5 06/20/2017    MM 3D SCREEN BREAST BILATERAL  Result Date: 02/16/2021 CLINICAL DATA:  Screening. EXAM: DIGITAL SCREENING BILATERAL MAMMOGRAM WITH TOMOSYNTHESIS AND CAD TECHNIQUE: Bilateral screening digital craniocaudal and mediolateral oblique mammograms were obtained. Bilateral screening digital breast tomosynthesis was performed. The images were evaluated with computer-aided detection. COMPARISON:  Previous exam(s). ACR Breast Density Category d: The breast tissue is extremely dense, which lowers the sensitivity of mammography FINDINGS: There are no findings suspicious for malignancy. IMPRESSION: No mammographic evidence of malignancy. A result letter of this screening mammogram will be mailed directly to the patient. RECOMMENDATION: Screening mammogram in one year. (Code:SM-B-01Y) BI-RADS CATEGORY  1: Negative. Electronically Signed   By: Nolon Nations M.D.   On: 02/16/2021 15:27    Assessment & Plan:   Shakeera was seen today for hypothyroidism.  Diagnoses and all orders for this visit:  Acquired hypothyroidism- She is euthyroid. -     TSH; Future -     TSH  Hyponatremia- Sodium is normal now. -     Basic metabolic panel; Future -     Basic metabolic panel  Chronic idiopathic constipation- Labs are negative for secondary causes.  Will treat with plecanatide. -     Magnesium; Future -     TSH; Future -     Basic metabolic panel; Future -     Plecanatide (TRULANCE) 3 MG TABS; Take 1 tablet (3 mg total) by mouth daily. -     Basic metabolic panel -      TSH -     Magnesium   I am having Mardene Celeste T. Arnall start on Trulance. I am also having her maintain her acebutolol, ezetimibe, citalopram, levothyroxine, LORazepam, and Eliquis.  Meds ordered this encounter  Medications   Plecanatide (TRULANCE) 3 MG TABS    Sig: Take 1 tablet (3 mg total) by mouth daily.    Dispense:  90 tablet    Refill:  1     Follow-up: Return in about 4 months (around 06/30/2022).  Scarlette Calico, MD

## 2022-03-17 ENCOUNTER — Other Ambulatory Visit: Payer: Self-pay | Admitting: Internal Medicine

## 2022-03-17 DIAGNOSIS — F411 Generalized anxiety disorder: Secondary | ICD-10-CM

## 2022-03-17 DIAGNOSIS — E039 Hypothyroidism, unspecified: Secondary | ICD-10-CM

## 2022-03-19 ENCOUNTER — Other Ambulatory Visit: Payer: Self-pay | Admitting: Internal Medicine

## 2022-03-19 DIAGNOSIS — F411 Generalized anxiety disorder: Secondary | ICD-10-CM

## 2022-03-19 MED ORDER — CITALOPRAM HYDROBROMIDE 20 MG PO TABS: 20.0000 mg | ORAL_TABLET | Freq: Every day | ORAL | 1 refills | Status: DC

## 2022-03-24 ENCOUNTER — Other Ambulatory Visit: Payer: Self-pay | Admitting: Cardiovascular Disease

## 2022-03-30 NOTE — Progress Notes (Signed)
Remote pacemaker transmission.   

## 2022-04-14 ENCOUNTER — Other Ambulatory Visit: Payer: Self-pay | Admitting: Internal Medicine

## 2022-04-14 ENCOUNTER — Other Ambulatory Visit: Payer: Self-pay | Admitting: Cardiovascular Disease

## 2022-04-14 DIAGNOSIS — E039 Hypothyroidism, unspecified: Secondary | ICD-10-CM

## 2022-04-27 ENCOUNTER — Ambulatory Visit (INDEPENDENT_AMBULATORY_CARE_PROVIDER_SITE_OTHER): Payer: Medicare Other | Admitting: Internal Medicine

## 2022-04-27 ENCOUNTER — Encounter: Payer: Self-pay | Admitting: Internal Medicine

## 2022-04-27 VITALS — BP 118/68 | HR 79 | Temp 97.8°F | Resp 16 | Ht 71.0 in | Wt 144.0 lb

## 2022-04-27 DIAGNOSIS — E785 Hyperlipidemia, unspecified: Secondary | ICD-10-CM | POA: Diagnosis not present

## 2022-04-27 DIAGNOSIS — E039 Hypothyroidism, unspecified: Secondary | ICD-10-CM

## 2022-04-27 LAB — TSH: TSH: 0.47 u[IU]/mL (ref 0.35–5.50)

## 2022-04-27 MED ORDER — LEVOTHYROXINE SODIUM 88 MCG PO TABS
88.0000 ug | ORAL_TABLET | Freq: Every day | ORAL | 1 refills | Status: DC
Start: 2022-04-27 — End: 2022-09-23

## 2022-04-27 NOTE — Progress Notes (Signed)
Subjective:  Patient ID: Amy Lane, female    DOB: 1950/08/26  Age: 72 y.o. MRN: [id number]  CC: Hypothyroidism   HPI Amy Lane presents for f/up -      She feels well. The constipation has resolved.  Outpatient Medications Prior to Visit  Medication Sig Dispense Refill   acebutolol (SECTRAL) 200 MG capsule TAKE 1 CAPSULE BY MOUTH EVERYDAY AT BEDTIME 90 capsule 0   citalopram (CELEXA) 20 MG tablet Take 1 tablet (20 mg total) by mouth daily. 90 tablet 1   ELIQUIS 5 MG TABS tablet TAKE 1 TABLET BY MOUTH TWICE A DAY 180 tablet 1   ezetimibe (ZETIA) 10 MG tablet TAKE 1 TABLET BY MOUTH EVERY DAY 90 tablet 0   LORazepam (ATIVAN) 1 MG tablet TAKE 1 TABLET (1 MG TOTAL) BY MOUTH EVERY 8 (EIGHT) HOURS AS NEEDED FOR ANXIETY 90 tablet 2   Plecanatide (TRULANCE) 3 MG TABS Take 1 tablet (3 mg total) by mouth daily. 90 tablet 1   levothyroxine (SYNTHROID) 88 MCG tablet Take 1 tablet (88 mcg total) by mouth daily. 90 tablet 1   No facility-administered medications prior to visit.    ROS Review of Systems  Constitutional: Negative.  Negative for diaphoresis and fatigue.  HENT: Negative.    Eyes: Negative.   Respiratory: Negative.  Negative for cough, chest tightness, shortness of breath and wheezing.   Cardiovascular:  Negative for chest pain, palpitations and leg swelling.  Gastrointestinal:  Negative for abdominal pain, constipation, diarrhea, nausea and vomiting.  Endocrine: Negative for cold intolerance and heat intolerance.  Genitourinary: Negative.  Negative for difficulty urinating.  Musculoskeletal: Negative.   Skin: Negative.   Neurological:  Negative for dizziness, weakness and light-headedness.  Hematological:  Negative for adenopathy. Does not bruise/bleed easily.  Psychiatric/Behavioral: Negative.      Objective:  BP 118/68 (BP Location: Left Arm, Patient Position: Sitting, Cuff Size: Large)   Pulse 79   Temp 97.8 F (36.6 C) (Oral)   Resp 16   Ht   (1.803 m)   Wt 144 lb (65.3 kg)   SpO2 97%   BMI 20.08 kg/m   BP Readings from Last 3 Encounters:  04/27/22 118/68  03/01/22 122/72  02/28/22 116/62    Wt Readings from Last 3 Encounters:  04/27/22 144 lb (65.3 kg)  03/01/22 148 lb (67.1 kg)  02/28/22 146 lb 3.2 oz (66.3 kg)    Physical Exam Vitals reviewed.  HENT:     Nose: Nose normal.     Mouth/Throat:     Mouth: Mucous membranes are moist.  Eyes:     General: No scleral icterus.    Conjunctiva/sclera: Conjunctivae normal.  Cardiovascular:     Rate and Rhythm: Normal rate and regular rhythm.     Heart sounds: No murmur heard. Pulmonary:     Effort: Pulmonary effort is normal.     Breath sounds: No stridor. No wheezing, rhonchi or rales.  Abdominal:     General: Abdomen is flat.     Palpations: There is no mass.     Tenderness: There is no abdominal tenderness. There is no guarding.     Hernia: No hernia is present.  Musculoskeletal:        General: Normal range of motion.     Cervical back: Neck supple.     Right lower leg: No edema.     Left lower leg: No edema.  Lymphadenopathy:     Cervical: No cervical adenopathy.  Skin:    General: Skin is warm and dry.  Neurological:     General: No focal deficit present.     Mental Status: She is alert.  Psychiatric:        Mood and Affect: Mood normal.        Behavior: Behavior normal.     Lab Results  Component Value Date   WBC 6.9 12/27/2021   HGB 14.0 12/27/2021   HCT 41.2 12/27/2021   PLT 206.0 12/27/2021   GLUCOSE 101 (H) 03/01/2022   CHOL 173 04/25/2021   TRIG 112.0 04/25/2021   HDL 59.40 04/25/2021   LDLDIRECT 101.0 10/07/2018   LDLCALC 91 04/25/2021   ALT 18 12/27/2021   AST 22 12/27/2021   NA 135 03/01/2022   K 4.5 03/01/2022   CL 100 03/01/2022   CREATININE 0.77 03/01/2022   BUN 11 03/01/2022   CO2 28 03/01/2022   TSH 0.47 04/27/2022   INR 1.11 06/19/2017   HGBA1C 5.5 06/20/2017    MM 3D SCREEN BREAST BILATERAL  Result Date:  02/16/2021 CLINICAL DATA:  Screening. EXAM: DIGITAL SCREENING BILATERAL MAMMOGRAM WITH TOMOSYNTHESIS AND CAD TECHNIQUE: Bilateral screening digital craniocaudal and mediolateral oblique mammograms were obtained. Bilateral screening digital breast tomosynthesis was performed. The images were evaluated with computer-aided detection. COMPARISON:  Previous exam(s). ACR Breast Density Category d: The breast tissue is extremely dense, which lowers the sensitivity of mammography FINDINGS: There are no findings suspicious for malignancy. IMPRESSION: No mammographic evidence of malignancy. A result letter of this screening mammogram will be mailed directly to the patient. RECOMMENDATION: Screening mammogram in one year. (Code:SM-B-01Y) BI-RADS CATEGORY  1: Negative. Electronically Signed   By: Norva Pavlov M.D.   On: 02/16/2021 15:27    Assessment & Plan:   Hyperlipidemia LDL goal <130 -     TSH; Future  Acquired hypothyroidism- She is euthyroid. Will continue the current T4 dose. -     TSH; Future -     Levothyroxine Sodium; Take 1 tablet (88 mcg total) by mouth daily.  Dispense: 90 tablet; Refill: 1     Follow-up: Return in about 6 months (around 10/27/2022).  Sanda Linger, MD

## 2022-04-27 NOTE — Patient Instructions (Signed)

## 2022-05-04 NOTE — Progress Notes (Signed)
Patient ID: Amy Ashingatricia T Canoy, female   DOB: 11/14/1950, 72 y.o.   MRN: [id number]     72 y.o. female history of bradycardia post pacer  Sees Dr Graciela HusbandsKlein  status post generator replacement and lead repair March 2011. PAF quiescent She had history of POTS/Long QT and dysautonomia associated with Ehlers-Danlos syndrome. Post left CEA patent by duplex May [facility] most recent echo 11/24/19 trivial MR no MS normal EF   Cognitive defect better on aricept  ? Of MS with LE weakness Unable to do MRI due to pacer Seen by Dr Anne HahnWillis Left visual field cut but  Carotids normal.   May in hospital with stroke/migraine like symptoms Confused and lost speech for 10 hours CT negative Rx migraine meds and to f/u with neurology Anne HahnWillis   Husband battling prostate cancer Finishing 80 Rx of hyperbaric oxygen to help heal Laser injury to colon/prostate   PPM normal function per EP with no PAF on device interrogation   Has been intolerant to statins with myalgias. LDL 80  05/13/20 on Zetia  Did not want to pursue PSK9    Thyroid is way off TSH 55 and synthroid dose increased Most recent TSH normal 0.47 on 04/25/22   Some spells of lightheadedness Seen by Dr Graciela HusbandsKlein Monitor 12/19/21 SR no PAF some nonsustained SVT and rare PVCls  No change in Rx by Dr Graciela HusbandsKlein. PACEART 02/13/22 normal battery status, lead impedence and histograms   She is now selling chalk paint for a [employer]   ROS: Denies fever, malais, weight loss, blurry vision, decreased visual acuity, cough, sputum, SOB, hemoptysis, pleuritic pain, palpitaitons, heartburn, abdominal pain, melena, lower extremity edema, claudication, or rash.  All other systems reviewed and negative  General: BP 116/62   Pulse 69   Ht 5\' 11"  (1.803 m)   Wt 146 lb 9.6 oz (66.5 kg)   SpO2 98%   BMI 20.45 kg/m   Affect appropriate Healthy:  appears stated age HEENT: normal Neck supple with no adenopathy Post CEA  no thyromegaly Lungs clear with no  wheezing and good diaphragmatic motion Heart:  S1/S2 no murmur, no rub, gallop or click PMI normal post right mini thoracotomy PPM under left clavicle  Abdomen: benighn, BS positve, no tenderness, no AAA no bruit.  No HSM or HJR Distal pulses intact with no bruits No edema Neuro non-focal Skin warm and dry No muscular weakness   Current Outpatient Medications  Medication Sig Dispense Refill   acebutolol (SECTRAL) 200 MG capsule TAKE 1 CAPSULE BY MOUTH EVERYDAY AT BEDTIME 90 capsule 0   citalopram (CELEXA) 20 MG tablet Take 1 tablet (20 mg total) by mouth daily. 90 tablet 1   ELIQUIS 5 MG TABS tablet TAKE 1 TABLET BY MOUTH TWICE A DAY 180 tablet 1   ezetimibe (ZETIA) 10 MG tablet TAKE 1 TABLET BY MOUTH EVERY DAY 90 tablet 0   levothyroxine (SYNTHROID) 88 MCG tablet Take 1 tablet (88 mcg total) by mouth daily. 90 tablet 1   LORazepam (ATIVAN) 1 MG tablet TAKE 1 TABLET (1 MG TOTAL) BY MOUTH EVERY 8 (EIGHT) HOURS AS NEEDED FOR ANXIETY 90 tablet 2   Plecanatide (TRULANCE) 3 MG TABS Take 1 tablet (3 mg total) by mouth daily. 90 tablet 1   No current facility-administered medications for this visit.    Allergies  Dapagliflozin-saxagliptin, Other, Statins, Lovastatin, Adhesive [tape], and Latex  Electrocardiogram:  06/20/17  A pacing RBBB 03/12/19 SR rate 72 RBBB PAC  Assessment and Plan  Pacer:  Reviewed Dr Koren Bound last note normal function normal remote transmission  ? When time Can it be changed out and be MRI compatible Normal PaCEART 02/14/22 reviewed   POTS:  Improved with no postural symptoms    TIA:  CEA patent by duplex 05/30/17 will update   Now on Eliquis with no bleeding issues ? Related to PAF Device with 0% PAF   MVrepair:  TTE 11/24/19 EF 55-60% trivial MR stable repair SBE prophylaxis   Thyroid:  Synthroid dose increased TSH now normal   Chol:  On Zetia LDL 100 intolerant to statins    Neuro:  Continue aricept for cognitive dysfunction F//U DR Anne Hahn Migraine with TIA  like symptoms now on Topamax   Ortho:  Patellar degeneration post cortisone injection f/u Giofre would not encourage surgery unless Pain gets worse   SVT: nonsustained episodes f/u EP continue Sectral    F/U with Graciela Husbands in 6 months and me in a year   [name]

## 2022-05-15 ENCOUNTER — Ambulatory Visit (INDEPENDENT_AMBULATORY_CARE_PROVIDER_SITE_OTHER): Payer: Medicare Other

## 2022-05-15 DIAGNOSIS — I495 Sick sinus syndrome: Secondary | ICD-10-CM

## 2022-05-15 LAB — CUP PACEART REMOTE DEVICE CHECK
Battery Impedance: 2275 Ohm
Battery Remaining Longevity: 31 mo
Battery Voltage: 2.74 V
Brady Statistic AP VP Percent: 0 %
Brady Statistic AP VS Percent: 80 %
Brady Statistic AS VP Percent: 0 %
Brady Statistic AS VS Percent: 19 %
Date Time Interrogation Session: 20240422100519
Implantable Pulse Generator Implant Date: 20110310
Lead Channel Impedance Value: 401 Ohm
Lead Channel Impedance Value: 651 Ohm
Lead Channel Pacing Threshold Amplitude: 0.875 V
Lead Channel Pacing Threshold Amplitude: 0.875 V
Lead Channel Pacing Threshold Pulse Width: 0.4 ms
Lead Channel Pacing Threshold Pulse Width: 0.4 ms
Lead Channel Setting Pacing Amplitude: 2 V
Lead Channel Setting Pacing Amplitude: 2.5 V
Lead Channel Setting Pacing Pulse Width: 0.4 ms
Lead Channel Setting Sensing Sensitivity: 4 mV
Zone Setting Status: 755011
Zone Setting Status: 755011

## 2022-05-17 ENCOUNTER — Ambulatory Visit: Payer: Medicare Other | Attending: Cardiovascular Disease | Admitting: Cardiovascular Disease

## 2022-05-17 ENCOUNTER — Other Ambulatory Visit: Payer: Self-pay | Admitting: Internal Medicine

## 2022-05-17 ENCOUNTER — Encounter: Payer: Self-pay | Admitting: Cardiovascular Disease

## 2022-05-17 ENCOUNTER — Encounter: Payer: Self-pay | Admitting: Internal Medicine

## 2022-05-17 ENCOUNTER — Other Ambulatory Visit: Payer: Self-pay | Admitting: Cardiovascular Disease

## 2022-05-17 VITALS — BP 116/62 | HR 69 | Ht 71.0 in | Wt 146.6 lb

## 2022-05-17 DIAGNOSIS — Z950 Presence of cardiac pacemaker: Secondary | ICD-10-CM

## 2022-05-17 DIAGNOSIS — I495 Sick sinus syndrome: Secondary | ICD-10-CM

## 2022-05-17 DIAGNOSIS — Z9889 Other specified postprocedural states: Secondary | ICD-10-CM | POA: Diagnosis not present

## 2022-05-17 DIAGNOSIS — I480 Paroxysmal atrial fibrillation: Secondary | ICD-10-CM

## 2022-05-17 DIAGNOSIS — E039 Hypothyroidism, unspecified: Secondary | ICD-10-CM

## 2022-05-17 NOTE — Patient Instructions (Signed)
Medication Instructions:  Your physician recommends that you continue on your current medications as directed. Please refer to the Current Medication list given to you today.  *If you need a refill on your cardiac medications before your next appointment, please call your pharmacy*  Lab Work: If you have labs (blood work) drawn today and your tests are completely normal, you will receive your results only by: MyChart Message (if you have MyChart) OR A paper copy in the mail If you have any lab test that is abnormal or we need to change your treatment, we will call you to review the results.  Testing/Procedures: None ordered today.  Follow-Up: At [facility], you and your health needs are our priority.  As part of our continuing mission to provide you with exceptional heart care, we have created designated Provider Care Teams.  These Care Teams include your primary Cardiologist (physician) and Advanced Practice Providers (APPs -  Physician Assistants and Nurse Practitioners) who all work together to provide you with the care you need, when you need it.  We recommend signing up for the patient portal called "MyChart".  Sign up information is provided on this After Visit Summary.  MyChart is used to connect with patients for Virtual Visits (Telemedicine).  Patients are able to view lab/test results, encounter notes, upcoming appointments, etc.  Non-urgent messages can be sent to your provider as well.   To learn more about what you can do with MyChart, go to [email or web address].    Your next appointment:   1 year(s)  Provider:   Peter Nishan, MD      

## 2022-05-18 NOTE — Telephone Encounter (Signed)
Prescription refill request for Eliquis received. Indication: afib  Last office visit: Nishan, 05/17/2022 Scr: 0.77, 03/01/2022 Age: 72 yo  Weight: 66.5 kg

## 2022-05-22 ENCOUNTER — Encounter: Payer: Self-pay | Admitting: Internal Medicine

## 2022-05-24 ENCOUNTER — Other Ambulatory Visit: Payer: Self-pay | Admitting: Internal Medicine

## 2022-05-24 ENCOUNTER — Telehealth: Payer: Self-pay | Admitting: Internal Medicine

## 2022-05-24 DIAGNOSIS — K5904 Chronic idiopathic constipation: Secondary | ICD-10-CM

## 2022-05-24 MED ORDER — MOTEGRITY 2 MG PO TABS
1.0000 | ORAL_TABLET | Freq: Every day | ORAL | 0 refills | Status: DC
Start: 2022-05-24 — End: 2022-10-30

## 2022-05-24 NOTE — Telephone Encounter (Signed)
Start motegrity

## 2022-05-24 NOTE — Telephone Encounter (Signed)
Patient called and states that the trulance is not working for her any longer.  She does not have a bowel movement for 4-5 days and then it is only small pieces.  Please advise.  Phone:  [phone]

## 2022-05-25 NOTE — Telephone Encounter (Signed)
Called pt, LVM stating a new Rx was sent.

## 2022-06-15 NOTE — Progress Notes (Signed)
Remote pacemaker transmission.   

## 2022-07-04 DIAGNOSIS — H2513 Age-related nuclear cataract, bilateral: Secondary | ICD-10-CM | POA: Diagnosis not present

## 2022-07-25 DIAGNOSIS — N952 Postmenopausal atrophic vaginitis: Secondary | ICD-10-CM | POA: Diagnosis not present

## 2022-07-25 DIAGNOSIS — N898 Other specified noninflammatory disorders of vagina: Secondary | ICD-10-CM | POA: Diagnosis not present

## 2022-07-31 ENCOUNTER — Other Ambulatory Visit: Payer: Self-pay

## 2022-07-31 ENCOUNTER — Emergency Department (HOSPITAL_BASED_OUTPATIENT_CLINIC_OR_DEPARTMENT_OTHER)
Admission: EM | Admit: 2022-07-31 | Discharge: 2022-07-31 | Discharge status: Against Medical Advice | Payer: Medicare Other | Attending: Emergency Medicine | Admitting: Emergency Medicine

## 2022-07-31 ENCOUNTER — Encounter (HOSPITAL_BASED_OUTPATIENT_CLINIC_OR_DEPARTMENT_OTHER): Payer: Self-pay

## 2022-07-31 DIAGNOSIS — W57XXXA Bitten or stung by nonvenomous insect and other nonvenomous arthropods, initial encounter: Secondary | ICD-10-CM | POA: Diagnosis not present

## 2022-07-31 DIAGNOSIS — Z5321 Procedure and treatment not carried out due to patient leaving prior to being seen by health care provider: Secondary | ICD-10-CM | POA: Diagnosis not present

## 2022-07-31 DIAGNOSIS — R220 Localized swelling, mass and lump, head: Secondary | ICD-10-CM | POA: Diagnosis not present

## 2022-07-31 NOTE — ED Notes (Signed)
Patient reported to staff feeling better.  No observable redness, no distress noted. Did not want to wait to see provider. Reviewed risk and patient verbalized understanding.  Patient escorted to exit

## 2022-07-31 NOTE — ED Notes (Signed)
Patient advised to alert staff if there any changes in severity of symptoms or new symptoms.

## 2022-07-31 NOTE — ED Triage Notes (Signed)
Patient here POV from Home.  Endorses being stung by a swarm of bees 1.5 hours ago. Multiple Sting Sites including Bilateral arms, back and legs. Redness and itching to sites. No other Symptoms such as Oral Swelling or SOB. No CP. No Medications PTA.  NAD Noted during Triage. A&Ox3. GCS 15. Ambulatory.

## 2022-08-02 DIAGNOSIS — R238 Other skin changes: Secondary | ICD-10-CM | POA: Diagnosis not present

## 2022-08-02 DIAGNOSIS — L720 Epidermal cyst: Secondary | ICD-10-CM | POA: Diagnosis not present

## 2022-08-02 DIAGNOSIS — L308 Other specified dermatitis: Secondary | ICD-10-CM | POA: Diagnosis not present

## 2022-08-07 DIAGNOSIS — L298 Other pruritus: Secondary | ICD-10-CM | POA: Diagnosis not present

## 2022-08-07 DIAGNOSIS — L308 Other specified dermatitis: Secondary | ICD-10-CM | POA: Diagnosis not present

## 2022-08-14 ENCOUNTER — Ambulatory Visit (INDEPENDENT_AMBULATORY_CARE_PROVIDER_SITE_OTHER): Payer: Medicare Other

## 2022-08-14 DIAGNOSIS — I495 Sick sinus syndrome: Secondary | ICD-10-CM | POA: Diagnosis not present

## 2022-08-15 LAB — CUP PACEART REMOTE DEVICE CHECK
Battery Impedance: 2384 Ohm
Battery Remaining Longevity: 30 mo
Battery Voltage: 2.74 V
Brady Statistic AP VP Percent: 0 %
Brady Statistic AP VS Percent: 80 %
Brady Statistic AS VP Percent: 0 %
Brady Statistic AS VS Percent: 20 %
Date Time Interrogation Session: 20240722213513
Implantable Pulse Generator Implant Date: 20110310
Lead Channel Impedance Value: 433 Ohm
Lead Channel Impedance Value: 662 Ohm
Lead Channel Pacing Threshold Amplitude: 0.875 V
Lead Channel Pacing Threshold Amplitude: 1 V
Lead Channel Pacing Threshold Pulse Width: 0.4 ms
Lead Channel Pacing Threshold Pulse Width: 0.4 ms
Lead Channel Setting Pacing Amplitude: 2 V
Lead Channel Setting Pacing Amplitude: 2.5 V
Lead Channel Setting Pacing Pulse Width: 0.4 ms
Lead Channel Setting Sensing Sensitivity: 2.8 mV
Zone Setting Status: 755011
Zone Setting Status: 755011

## 2022-08-16 ENCOUNTER — Other Ambulatory Visit: Payer: Self-pay | Admitting: Internal Medicine

## 2022-08-16 DIAGNOSIS — K5904 Chronic idiopathic constipation: Secondary | ICD-10-CM

## 2022-08-23 NOTE — Progress Notes (Signed)
Remote pacemaker transmission.   

## 2022-09-18 ENCOUNTER — Other Ambulatory Visit: Payer: Self-pay | Admitting: Cardiovascular Disease

## 2022-09-22 ENCOUNTER — Other Ambulatory Visit: Payer: Self-pay | Admitting: Internal Medicine

## 2022-09-22 DIAGNOSIS — E039 Hypothyroidism, unspecified: Secondary | ICD-10-CM

## 2022-09-27 ENCOUNTER — Other Ambulatory Visit: Payer: Self-pay | Admitting: Internal Medicine

## 2022-09-27 DIAGNOSIS — F411 Generalized anxiety disorder: Secondary | ICD-10-CM

## 2022-10-19 DIAGNOSIS — L538 Other specified erythematous conditions: Secondary | ICD-10-CM | POA: Diagnosis not present

## 2022-10-19 DIAGNOSIS — L308 Other specified dermatitis: Secondary | ICD-10-CM | POA: Diagnosis not present

## 2022-10-19 DIAGNOSIS — R208 Other disturbances of skin sensation: Secondary | ICD-10-CM | POA: Diagnosis not present

## 2022-10-25 ENCOUNTER — Other Ambulatory Visit: Payer: Self-pay | Admitting: Internal Medicine

## 2022-10-25 DIAGNOSIS — F411 Generalized anxiety disorder: Secondary | ICD-10-CM

## 2022-10-30 ENCOUNTER — Ambulatory Visit (INDEPENDENT_AMBULATORY_CARE_PROVIDER_SITE_OTHER): Payer: Medicare Other | Admitting: Internal Medicine

## 2022-10-30 ENCOUNTER — Encounter: Payer: Self-pay | Admitting: Internal Medicine

## 2022-10-30 VITALS — BP 122/74 | HR 74 | Temp 97.7°F | Resp 16 | Ht 71.0 in | Wt 140.0 lb

## 2022-10-30 DIAGNOSIS — E039 Hypothyroidism, unspecified: Secondary | ICD-10-CM

## 2022-10-30 DIAGNOSIS — Z23 Encounter for immunization: Secondary | ICD-10-CM

## 2022-10-30 DIAGNOSIS — G901 Familial dysautonomia [Riley-Day]: Secondary | ICD-10-CM | POA: Diagnosis not present

## 2022-10-30 DIAGNOSIS — E785 Hyperlipidemia, unspecified: Secondary | ICD-10-CM

## 2022-10-30 DIAGNOSIS — R9431 Abnormal electrocardiogram [ECG] [EKG]: Secondary | ICD-10-CM | POA: Insufficient documentation

## 2022-10-30 DIAGNOSIS — R0609 Other forms of dyspnea: Secondary | ICD-10-CM | POA: Insufficient documentation

## 2022-10-30 DIAGNOSIS — I480 Paroxysmal atrial fibrillation: Secondary | ICD-10-CM | POA: Diagnosis not present

## 2022-10-30 LAB — CBC WITH DIFFERENTIAL/PLATELET
Basophils Absolute: 0 10*3/uL (ref 0.0–0.1)
Basophils Relative: 0.5 % (ref 0.0–3.0)
Eosinophils Absolute: 0.1 10*3/uL (ref 0.0–0.7)
Eosinophils Relative: 1.2 % (ref 0.0–5.0)
HCT: 39.7 % (ref 36.0–46.0)
Hemoglobin: 13.2 g/dL (ref 12.0–15.0)
Lymphocytes Relative: 16 % (ref 12.0–46.0)
Lymphs Abs: 1.2 10*3/uL (ref 0.7–4.0)
MCHC: 33.1 g/dL (ref 30.0–36.0)
MCV: 92.1 fL (ref 78.0–100.0)
Monocytes Absolute: 0.7 10*3/uL (ref 0.1–1.0)
Monocytes Relative: 9.2 % (ref 3.0–12.0)
Neutro Abs: 5.6 10*3/uL (ref 1.4–7.7)
Neutrophils Relative %: 73.1 % (ref 43.0–77.0)
Platelets: 188 10*3/uL (ref 150.0–400.0)
RBC: 4.32 Mil/uL (ref 3.87–5.11)
RDW: 13.8 % (ref 11.5–15.5)
WBC: 7.6 10*3/uL (ref 4.0–10.5)

## 2022-10-30 LAB — BASIC METABOLIC PANEL
BUN: 14 mg/dL (ref 6–23)
CO2: 26 meq/L (ref 19–32)
Calcium: 9.6 mg/dL (ref 8.4–10.5)
Chloride: 99 meq/L (ref 96–112)
Creatinine, Ser: 0.77 mg/dL (ref 0.40–1.20)
GFR: 78.28 mL/min (ref >60.00)
Glucose, Bld: 103 mg/dL — ABNORMAL HIGH (ref 70–99)
Potassium: 4 meq/L (ref 3.5–5.1)
Sodium: 134 meq/L — ABNORMAL LOW (ref 135–145)

## 2022-10-30 LAB — LIPID PANEL
Cholesterol: 181 mg/dL (ref 0–200)
HDL: 66.8 mg/dL (ref >39.00)
LDL Cholesterol: 101 mg/dL — ABNORMAL HIGH (ref 0–99)
NonHDL: 113.91
Total CHOL/HDL Ratio: 3
Triglycerides: 65 mg/dL (ref 0.0–149.0)
VLDL: 13 mg/dL (ref 0.0–40.0)

## 2022-10-30 LAB — HEPATIC FUNCTION PANEL
ALT: 13 U/L (ref 0–35)
AST: 19 U/L (ref 0–37)
Albumin: 4 g/dL (ref 3.5–5.2)
Alkaline Phosphatase: 70 U/L (ref 39–117)
Bilirubin, Direct: 0.1 mg/dL (ref 0.0–0.3)
Total Bilirubin: 0.5 mg/dL (ref 0.2–1.2)
Total Protein: 7 g/dL (ref 6.0–8.3)

## 2022-10-30 LAB — TROPONIN I (HIGH SENSITIVITY): High Sens Troponin I: 4 ng/L (ref 2–17)

## 2022-10-30 LAB — CORTISOL: Cortisol, Plasma: 9.1 ug/dL

## 2022-10-30 LAB — TSH: TSH: 0.02 u[IU]/mL — ABNORMAL LOW (ref 0.35–5.50)

## 2022-10-30 LAB — BRAIN NATRIURETIC PEPTIDE: Pro B Natriuretic peptide (BNP): 81 pg/mL (ref 0.0–100.0)

## 2022-10-30 MED ORDER — UNITHROID 75 MCG PO TABS: 75.0000 ug | ORAL_TABLET | Freq: Every day | ORAL | 0 refills | Status: DC

## 2022-10-30 NOTE — Patient Instructions (Signed)

## 2022-10-30 NOTE — Progress Notes (Unsigned)
Subjective:  Patient ID: Amy Lane, female    DOB: 04-Jun-1950  Age: 72 y.o. MRN: [id number]  CC: Hypothyroidism   HPI Amy Lane presents for f/up -----  Discussed the use of AI scribe software for clinical note transcription with the patient, who gave verbal consent to proceed.  History of Present Illness   The patient has been experiencing recurrent episodes of cold chills, weakness, nausea, and sweating, described as "cold sweats," for the duration of the summer. These episodes occur at varying frequencies, ranging from daily to four times a day, and are often accompanied by dizziness and a feeling of impending syncope. The patient has also reported shortness of breath during these episodes. Two nights prior to the consultation, the patient experienced a new symptom of pressure around the heart, which was associated with discomfort and difficulty finding a comfortable position.  The patient has also reported occasional diarrhea, but denies fever, coughing, and stomachache. There has been a recent weight loss of seven pounds, which the patient attributes to dietary changes. The patient has a history of bee stings in June, which were treated with injections by a dermatologist.  The patient has a history of dysautonomia, and these recent symptoms are perceived as an exacerbation of the condition. The patient has been managing these symptoms by limiting activities, such as driving only short distances. The patient also reports taking lorazepam nightly for sleep, and denies any symptoms of sleep apnea or snoring.       Outpatient Medications Prior to Visit  Medication Sig Dispense Refill   acebutolol (SECTRAL) 200 MG capsule TAKE 1 CAPSULE BY MOUTH EVERYDAY AT BEDTIME 90 capsule 2   citalopram (CELEXA) 20 MG tablet TAKE 1 TABLET BY MOUTH EVERY DAY 90 tablet 0   ELIQUIS 5 MG TABS tablet TAKE 1 TABLET BY MOUTH TWICE A DAY 180 tablet 1   ezetimibe (ZETIA) 10 MG tablet TAKE 1  TABLET BY MOUTH EVERY DAY 90 tablet 3   LORazepam (ATIVAN) 1 MG tablet TAKE 1 TABLET BY MOUTH EVERY 8 HOURS AS NEEDED FOR ANXIETY 90 tablet 1   TRULANCE 3 MG TABS TAKE 1 TABLET BY MOUTH DAILY 90 tablet 1   levothyroxine (SYNTHROID) 88 MCG tablet TAKE 1 TABLET BY MOUTH EVERY DAY 90 tablet 0   Prucalopride Succinate (MOTEGRITY) 2 MG TABS Take 1 tablet (2 mg total) by mouth daily. 90 tablet 0   No facility-administered medications prior to visit.    ROS Review of Systems  Objective:  BP 122/74 (BP Location: Left Arm, Patient Position: Sitting, Cuff Size: Large)   Pulse 74   Temp 97.7 F (36.5 C) (Oral)   Resp 16   Ht 5\' 11"  (1.803 m)   Wt 140 lb (63.5 kg)   SpO2 97%   BMI 19.53 kg/m   BP Readings from Last 3 Encounters:  10/30/22 122/74  07/31/22 127/87  05/17/22 116/62    Wt Readings from Last 3 Encounters:  10/30/22 140 lb (63.5 kg)  07/31/22 147 lb (66.7 kg)  05/17/22 146 lb 9.6 oz (66.5 kg)    Physical Exam Vitals reviewed.  Constitutional:      General: She is not in acute distress.    Appearance: Normal appearance. She is not toxic-appearing or diaphoretic.  HENT:     Nose: Nose normal.     Mouth/Throat:     Mouth: Mucous membranes are moist.  Eyes:     General: No scleral icterus.    Conjunctiva/sclera: Conjunctivae  normal.  Cardiovascular:     Rate and Rhythm: Normal rate. Occasional Extrasystoles are present.    Heart sounds: Heart sounds are distant. No murmur heard.    No friction rub. No gallop.     Comments: EKG- Atrial-paced with prolonged AV conduction and PAC's RAD,RVH appear unchanged Pulmonary:     Effort: Pulmonary effort is normal.     Breath sounds: No stridor. No wheezing, rhonchi or rales.  Abdominal:     General: Abdomen is flat.     Palpations: There is no mass.     Tenderness: There is no abdominal tenderness. There is no guarding.     Hernia: No hernia is present.  Musculoskeletal:        General: Normal range of motion.      Cervical back: Neck supple.     Right lower leg: No edema.     Left lower leg: No edema.  Lymphadenopathy:     Cervical: No cervical adenopathy.  Skin:    General: Skin is warm and dry.  Neurological:     General: No focal deficit present.     Mental Status: She is alert. Mental status is at baseline.  Psychiatric:        Mood and Affect: Mood normal.        Behavior: Behavior normal.     Lab Results  Component Value Date   WBC 7.6 10/30/2022   HGB 13.2 10/30/2022   HCT 39.7 10/30/2022   PLT 188.0 10/30/2022   GLUCOSE 103 (H) 10/30/2022   CHOL 181 10/30/2022   TRIG 65.0 10/30/2022   HDL 66.80 10/30/2022   LDLDIRECT 101.0 10/07/2018   LDLCALC 101 (H) 10/30/2022   ALT 13 10/30/2022   AST 19 10/30/2022   NA 134 (L) 10/30/2022   K 4.0 10/30/2022   CL 99 10/30/2022   CREATININE 0.77 10/30/2022   BUN 14 10/30/2022   CO2 26 10/30/2022   TSH 0.02 (L) 10/30/2022   INR 1.11 06/19/2017   HGBA1C 5.5 06/20/2017    No results found.  Assessment & Plan:  Acquired hypothyroidism -     TSH; Future -     CBC with Differential/Platelet; Future -     Unithroid; Take 1 tablet (75 mcg total) by mouth daily before breakfast.  Dispense: 90 tablet; Refill: 0  Hyperlipidemia LDL goal <130 -     Lipid panel; Future -     TSH; Future -     Hepatic function panel; Future -     CBC with Differential/Platelet; Future  Paroxysmal atrial fibrillation (HCC) -     TSH; Future -     Basic metabolic panel; Future -     CBC with Differential/Platelet; Future -     EKG 12-Lead -     Cortisol; Future -     Brain natriuretic peptide; Future -     Troponin I (High Sensitivity); Future  Flu vaccine need -     Flu Vaccine Trivalent High Dose (Fluad)  Abnormal electrocardiogram (ECG) (EKG) -     Cortisol; Future -     Brain natriuretic peptide; Future -     Troponin I (High Sensitivity); Future -     ECHOCARDIOGRAM COMPLETE; Future  Dysautonomia (HCC) -     Cortisol; Future -     Brain  natriuretic peptide; Future -     Troponin I (High Sensitivity); Future  DOE (dyspnea on exertion) -     Cortisol; Future -  Brain natriuretic peptide; Future -     Troponin I (High Sensitivity); Future -     ECHOCARDIOGRAM COMPLETE; Future     Follow-up: Return in about 3 months (around 01/30/2023).  Sanda Linger, MD

## 2022-11-13 ENCOUNTER — Ambulatory Visit: Payer: Medicare Other | Attending: Internal Medicine

## 2022-11-13 DIAGNOSIS — I495 Sick sinus syndrome: Secondary | ICD-10-CM | POA: Diagnosis not present

## 2022-11-13 LAB — CUP PACEART REMOTE DEVICE CHECK
Battery Impedance: 2908 Ohm
Battery Remaining Longevity: 24 mo
Battery Voltage: 2.73 V
Brady Statistic AP VP Percent: 0 %
Brady Statistic AP VS Percent: 79 %
Brady Statistic AS VP Percent: 0 %
Brady Statistic AS VS Percent: 21 %
Date Time Interrogation Session: 20241021092719
Implantable Pulse Generator Implant Date: 20110310
Lead Channel Impedance Value: 415 Ohm
Lead Channel Impedance Value: 636 Ohm
Lead Channel Pacing Threshold Amplitude: 0.875 V
Lead Channel Pacing Threshold Amplitude: 1 V
Lead Channel Pacing Threshold Pulse Width: 0.4 ms
Lead Channel Pacing Threshold Pulse Width: 0.4 ms
Lead Channel Setting Pacing Amplitude: 2 V
Lead Channel Setting Pacing Amplitude: 2.5 V
Lead Channel Setting Pacing Pulse Width: 0.4 ms
Lead Channel Setting Sensing Sensitivity: 4 mV
Zone Setting Status: 755011
Zone Setting Status: 755011

## 2022-11-15 ENCOUNTER — Ambulatory Visit (HOSPITAL_COMMUNITY): Payer: Medicare Other | Attending: Internal Medicine

## 2022-11-15 DIAGNOSIS — R0609 Other forms of dyspnea: Secondary | ICD-10-CM | POA: Diagnosis not present

## 2022-11-15 DIAGNOSIS — R9431 Abnormal electrocardiogram [ECG] [EKG]: Secondary | ICD-10-CM | POA: Diagnosis not present

## 2022-11-15 LAB — ECHOCARDIOGRAM COMPLETE
Area-P 1/2: 2.63 cm2
MV VTI: 1.26 cm2
S' Lateral: 3.1 cm

## 2022-11-16 ENCOUNTER — Encounter: Payer: Self-pay | Admitting: Internal Medicine

## 2022-11-18 NOTE — Progress Notes (Signed)
Cardiology Office Note:  .   Date:  11/27/2022  ID:  Amy Lane, DOB 02-20-1950, MRN [id number] PCP: Etta Grandchild, MD  [facility] Providers Cardiologist:  [name], MD    Patient Profile: .      PMH Symptomatic bradycardia  S/p PPM implant 1998 Gen change and new RA lead 2003 (old RA lead explanted) Gen change 03/2009 POTS Long QT syndrome Carotid artery disease S/p left CEA Syncope PAF TIA On chronic anticoagulation Mitral valve prolapse  S/p MV repair at [facility] Hypothyroid Dysautonomia Lorinda Creed Marfanoid habitus SVT Migraine  History of bradycardia post PPM implant. Followed by Dr. Graciela Husbands for EP. She has a history of PAF and POTS/long QT syndrome along with dysautonomia associated with Ehlers-Danlos syndrome.  She underwent left CEA with most recent carotid duplex 05/2017 which showed near normal bilateral carotids with minimal plaque.  She underwent mitral valve repair at [facility] clinic for mitral valve prolapse in 2019.  She has had a history of stroke like symptoms with confusion also history of migraines.  Previously intolerant of statins with myalgias.    Last cardiology clinic visit was 05/17/2022 with Dr. [name].  She was not having any specific cardiac symptoms and was advised to follow-up with Dr. Graciela Husbands in 6 months and Dr. [name] in 1 year.  Most recent echocardiogram 11/15/22 reveals LVEF 55-60%, no rwma, moderate LVH of basal-septal segment, G1DD, elevated LVEDP, trivial MR, mild mitral stenosis with MV gradient 5.0 mmHg, mildly dilated pulmonary artery.        History of Present Illness: .   Amy Lane is a very pleasant 72 y.o. female who is here today for evaluation of recent episode of presyncope, dizziness and nausea. These episodes have been occurring since late spring and have recently become more severe.  She reported the symptoms to PCP in October and he ordered an echocardiogram due to concerns on her EKG at that  time.  Echo 11/15/2022 revealed normal LVEF 55 to 60%, moderate asymmetric LVH of the basal septal segment, G1 DD, elevated LVEDP, moderately dosed RV function, previously repaired mitral valve with trivial MR and mild mitral stenosis.  She was driving to grocery store yesterday (11/26/22) and started to feel dizzy and nauseated.  She was close to the store so she sat in the parking lot for a few minutes after she parked.  She eventually felt better and went into the store but began to feel worse while shopping and had someone called EMS.  BP was significantly elevated at 200/100.  She was seen in [facility] ED. Reports device technician came to check her pacemaker and she was told that everything looked good. EKG without acute abnormality. Troponin was negative. Had significant bilateral ankle swelling which was new. Recent shortness of breath and pressure in the chest which is new, feeling out of breath after physical activity, such as climbing stairs.Marland Kitchen TSH was low at 0.02 on 10/30/22 and PCP decreased her thyroid medication. History of mitral valve repair in 2002 at which time she thinks she had cardiac catheterization prior. She maintains a healthy diet and drinks plenty of water, but has recently stopped using salt and sugar. At times, home BP cuff does not register BP.   Discussed the use of AI scribe software for clinical note transcription with the patient, who gave verbal consent to proceed.   ROS: See HPI       Studies Reviewed: .        Risk  Assessment/Calculations:             Physical Exam:   VS:  BP (!) 108/58 (BP Location: Left Arm, Patient Position: Sitting, Cuff Size: Normal)   Pulse 69   Ht 5\' 11"  (1.803 m)   Wt 140 lb 6.4 oz (63.7 kg)   SpO2 98%   BMI 19.58 kg/m    Wt Readings from Last 3 Encounters:  11/27/22 140 lb 6.4 oz (63.7 kg)  11/26/22 139 lb 15.9 oz (63.5 kg)  10/30/22 140 lb (63.5 kg)    GEN: Well nourished, well developed in no acute distress NECK: No JVD; No carotid  bruits CARDIAC: RRR, no murmurs, rubs, gallops RESPIRATORY:  Clear to auscultation without rales, wheezing or rhonchi  ABDOMEN: Soft, non-tender, non-distended EXTREMITIES:  No edema; No deformity     ASSESSMENT AND PLAN: .    Presyncope: History of POTS and dysautonomia, but stable for many years. Recent recurrent episodes of dizziness, nausea, and near syncope since late spring. ED admission 11/26/22 for episode associated with ankle swelling and elevated blood pressure. No syncope. Echocardiogram completed 11/15/22 shows thickened left ventricle and diastolic heart failure.  Home BP monitor at times does not register suggesting possible hypotension. Has been on acebutolol for many years.  We will continue acebutolol for now and get coronary CT for mapping of coronary anatomy as noted below.  Angina: Recent symptoms of shortness of breath, chest discomfort, and presyncope concerning for angina.  No recent ischemia evaluation. We will get coronary CTA for evaluation of ischemia. She was advised by ED provider not to drive. I advised her to continue to follow this guidance. Consideration given to adding Corlanor to lower heart rate prior to CT, however she has a history of prolonged QT and is on Celexa. She will adjust her acebutolol dose to take it 2 hours before CT.  LVH/Diastolic dysfunction: Echo 11/15/22 revealed normal LVEF, moderate asymmetric LVH of basal septal segment, G1 DD, elevated LVEDP, moderately reduced RVSF.  Results reviewed in detail and all questions answered. She is having increased shortness of breath and chest discomfort. Appears euvolemic on exam. She had LE edema on one occasion recently but no other edema. No orthopnea or PND. BP is well controlled. Consideration given to adding diuretic, however BP is soft and with recent dizziness and presyncope, I do not feel she would tolerate additional medication. Encouraged her to maintain low sodium diet and continue regular physical  activity.   S/p MVR: History of mitral valve repair in 2002.  Echo 11/15/22 reveals stable mitral valve function.  We will continue to monitor clinically at this time.  Pacemaker implant: PPM evaluated at [facility] during ED admission on 11/26/22 with normal function per device rep. Report is uploaded under media tab. Will ask Dr. Graciela Husbands to review.   Long Q-T Syndrome: Stable QTc on recent EKGs. HR is stable. No recent medication changes, has been on Celexa for several years. No medication changes today.   Hyperlipidemia: LDL 101 on 10/30/22.  Discussion regarding LDL goal < 100, lower if there is evidence of CAD on coronary CTA.  She is agreeable to start rosuvastatin 10 mg daily.  We will plan to recheck lipid and liver panel in 2 to 3 months.       Dispo: Keep your December appointment with Dr. Graciela Husbands  Signed, Eligha Bridegroom, NP-C

## 2022-11-26 ENCOUNTER — Other Ambulatory Visit: Payer: Self-pay

## 2022-11-26 ENCOUNTER — Emergency Department (HOSPITAL_COMMUNITY)
Admission: EM | Admit: 2022-11-26 | Discharge: 2022-11-26 | Disposition: A | Discharge status: Home / Self Care | Payer: Medicare Other | Attending: Emergency Medicine | Admitting: Emergency Medicine

## 2022-11-26 ENCOUNTER — Encounter (HOSPITAL_COMMUNITY): Payer: Self-pay

## 2022-11-26 ENCOUNTER — Emergency Department (HOSPITAL_COMMUNITY): Payer: Medicare Other

## 2022-11-26 DIAGNOSIS — Z8673 Personal history of transient ischemic attack (TIA), and cerebral infarction without residual deficits: Secondary | ICD-10-CM | POA: Insufficient documentation

## 2022-11-26 DIAGNOSIS — R55 Syncope and collapse: Secondary | ICD-10-CM | POA: Insufficient documentation

## 2022-11-26 DIAGNOSIS — Z7901 Long term (current) use of anticoagulants: Secondary | ICD-10-CM | POA: Diagnosis not present

## 2022-11-26 DIAGNOSIS — R42 Dizziness and giddiness: Secondary | ICD-10-CM | POA: Diagnosis not present

## 2022-11-26 DIAGNOSIS — R609 Edema, unspecified: Secondary | ICD-10-CM | POA: Diagnosis not present

## 2022-11-26 DIAGNOSIS — R079 Chest pain, unspecified: Secondary | ICD-10-CM | POA: Diagnosis not present

## 2022-11-26 DIAGNOSIS — E039 Hypothyroidism, unspecified: Secondary | ICD-10-CM | POA: Diagnosis not present

## 2022-11-26 DIAGNOSIS — Z950 Presence of cardiac pacemaker: Secondary | ICD-10-CM | POA: Diagnosis not present

## 2022-11-26 DIAGNOSIS — J449 Chronic obstructive pulmonary disease, unspecified: Secondary | ICD-10-CM | POA: Diagnosis not present

## 2022-11-26 DIAGNOSIS — Z91040 Latex allergy status: Secondary | ICD-10-CM | POA: Insufficient documentation

## 2022-11-26 DIAGNOSIS — R531 Weakness: Secondary | ICD-10-CM | POA: Diagnosis not present

## 2022-11-26 DIAGNOSIS — I10 Essential (primary) hypertension: Secondary | ICD-10-CM | POA: Diagnosis not present

## 2022-11-26 DIAGNOSIS — Z79890 Hormone replacement therapy (postmenopausal): Secondary | ICD-10-CM | POA: Diagnosis not present

## 2022-11-26 LAB — BASIC METABOLIC PANEL
Anion gap: 9 (ref 5–15)
BUN: 10 mg/dL (ref 8–23)
CO2: 24 mmol/L (ref 22–32)
Calcium: 9.5 mg/dL (ref 8.9–10.3)
Chloride: 101 mmol/L (ref 98–111)
Creatinine, Ser: 0.81 mg/dL (ref 0.44–1.00)
GFR, Estimated: >60 mL/min (ref >60)
Glucose, Bld: 89 mg/dL (ref 70–99)
Potassium: 4.1 mmol/L (ref 3.5–5.1)
Sodium: 134 mmol/L — ABNORMAL LOW (ref 135–145)

## 2022-11-26 LAB — CBC
HCT: 37.5 % (ref 36.0–46.0)
Hemoglobin: 12.7 g/dL (ref 12.0–15.0)
MCH: 30.4 pg (ref 26.0–34.0)
MCHC: 33.9 g/dL (ref 30.0–36.0)
MCV: 89.7 fL (ref 80.0–100.0)
Platelets: 179 10*3/uL (ref 150–400)
RBC: 4.18 MIL/uL (ref 3.87–5.11)
RDW: 12.9 % (ref 11.5–15.5)
WBC: 5.8 10*3/uL (ref 4.0–10.5)
nRBC: 0 % (ref 0.0–0.2)

## 2022-11-26 LAB — CBG MONITORING, ED: Glucose-Capillary: 88 mg/dL (ref 70–99)

## 2022-11-26 LAB — TROPONIN I (HIGH SENSITIVITY)
Troponin I (High Sensitivity): 4 ng/L (ref <18)
Troponin I (High Sensitivity): 4 ng/L (ref <18)

## 2022-11-26 NOTE — ED Triage Notes (Signed)
Pt arrived via [facility], pt was shopping at [employer] and while shopping became lightheaded, dizzy and generalized weakness. Pt had a heavy chest pressure when she would take every eight steps. Pt states she felt like she was going to pass out. Pt told EMS, she felt weakness coming on while she was driving to the store, but got worse while shopping. Pt has 1+ edema of lower extremity bilat.

## 2022-11-26 NOTE — ED Provider Notes (Signed)
[place] EMERGENCY DEPARTMENT AT [facility] Provider Note   CSN: [id number] Arrival date & time: 11/26/22  1537     History {Add pertinent medical, surgical, social history, OB history to HPI:1} Chief Complaint  Patient presents with   Weakness   Dizziness    Amy Lane is a 72 y.o. female with an extensive past medical history including Ehlers-Danlos syndrome, long QT syndrome, POTS, intermittent SVT, sinoatrial node dysfunction, recently diagnosed diastolic heart dysfunction.  Patient reports history of recurrent near syncope.  Today she was driving to the grocery store when she started to feel lightheaded.  She drove and parked and sat at the grocery store for about 10 minutes and began to feel better however when she got inside she began feeling pressure in her chest, lightheadedness and weakness in her legs.  She felt markedly short of breath when she would ambulate forward a few steps and then had have to stop to catch her breath.  She began feeling more lightheaded like she was going to pass out and was able to get on a bench and lie down.  She then contacted family who encouraged her to call 911.  She was feeling pressure in her chest which is now resolved.  She had associated paresthesia of the lower extremities.  She denies unilateral leg swelling or increased swelling in either of her legs, swelling in her abdomen.  She does report that she had an event 3 weeks ago that woke her from sleep where she had severe pressure in her chest lasting about an hour.  This was what prompted her most recent echocardiogram on 11/15/2022.  Results showed EF of 55 to 60% with grade 1 diastolic dysfunction   Weakness Associated symptoms: dizziness   Dizziness Associated symptoms: weakness        Home Medications Prior to Admission medications   Medication Sig Start Date End Date Taking? Authorizing Provider  acebutolol (SECTRAL) 200 MG capsule TAKE 1 CAPSULE BY MOUTH  EVERYDAY AT BEDTIME 09/19/22  Yes [name], MD  citalopram (CELEXA) 20 MG tablet TAKE 1 TABLET BY MOUTH EVERY DAY 10/25/22  Yes Etta Grandchild, MD  docusate sodium (COLACE) 100 MG capsule Take 100 mg by mouth every evening.   Yes [provider]  ELIQUIS 5 MG TABS tablet TAKE 1 TABLET BY MOUTH TWICE A DAY 05/18/22  Yes Duke Salvia, MD  ezetimibe (ZETIA) 10 MG tablet TAKE 1 TABLET BY MOUTH EVERY DAY 05/18/22  Yes [name], MD  LORazepam (ATIVAN) 1 MG tablet TAKE 1 TABLET BY MOUTH EVERY 8 HOURS AS NEEDED FOR ANXIETY Patient taking differently: Take 1 mg by mouth at bedtime. for anxiety 09/28/22  Yes Etta Grandchild, MD  TRULANCE 3 MG TABS TAKE 1 TABLET BY MOUTH DAILY 08/16/22  Yes Etta Grandchild, MD  UNITHROID 75 MCG tablet Take 1 tablet (75 mcg total) by mouth daily before breakfast. 10/30/22  Yes Etta Grandchild, MD      Allergies    Dapagliflozin-saxagliptin, Other, Statins, Lovastatin, Adhesive [tape], and Latex    Review of Systems   Review of Systems  Neurological:  Positive for dizziness and weakness.    Physical Exam Updated Vital Signs BP (!) 163/76   Pulse 64   Temp 97.9 F (36.6 C) (Oral)   Resp 15   Ht 5\' 11"  (1.803 m)   Wt 63.5 kg   SpO2 100%   BMI 19.52 kg/m  Physical Exam Vitals  and nursing note reviewed.  Constitutional:      General: She is not in acute distress.    Appearance: She is well-developed. She is not diaphoretic.  HENT:     Head: Normocephalic and atraumatic.     Right Ear: External ear normal.     Left Ear: External ear normal.     Nose: Nose normal.     Mouth/Throat:     Mouth: Mucous membranes are moist.  Eyes:     General: No scleral icterus.    Conjunctiva/sclera: Conjunctivae normal.  Cardiovascular:     Rate and Rhythm: Normal rate and regular rhythm.     Heart sounds: Normal heart sounds. No murmur heard.    No friction rub. No gallop.  Pulmonary:     Effort: Pulmonary effort is normal. No respiratory distress.      Breath sounds: Normal breath sounds.  Abdominal:     General: Bowel sounds are normal. There is no distension.     Palpations: Abdomen is soft. There is no mass.     Tenderness: There is no abdominal tenderness. There is no guarding.  Musculoskeletal:     Cervical back: Normal range of motion.     Right lower leg: No edema.     Left lower leg: No edema.  Skin:    General: Skin is warm and dry.  Neurological:     Mental Status: She is alert and oriented to person, place, and time.  Psychiatric:        Behavior: Behavior normal.     ED Results / Procedures / Treatments   Labs (all labs ordered are listed, but only abnormal results are displayed) Labs Reviewed  BASIC METABOLIC PANEL - Abnormal; Notable for the following components:      Result Value   Sodium 134 (*)    All other components within normal limits  CBC  CBG MONITORING, ED  I-STAT CHEM 8, ED  TROPONIN I (HIGH SENSITIVITY)    EKG None  Radiology DG Chest 2 View  Result Date: 11/26/2022 CLINICAL DATA:  Chest pain.  Dizziness.  Presyncope. EXAM: CHEST - 2 VIEW COMPARISON:  11/16/2017 FINDINGS: The heart size and mediastinal contours are within normal limits. Prior median sternotomy again noted. Permanent pacemaker remains in appropriate position. Pulmonary hyperinflation again seen, consistent with COPD. Both lungs are clear. IMPRESSION: No active cardiopulmonary disease. COPD. Electronically Signed   By: Danae Orleans M.D.   On: 11/26/2022 16:47    Procedures Procedures  {Document cardiac monitor, telemetry assessment procedure when appropriate:1}  Medications Ordered in ED Medications - No data to display  ED Course/ Medical Decision Making/ A&P   {   Click here for ABCD2, HEART and other calculatorsREFRESH Note before signing :1}                              Medical Decision Making Amount and/or Complexity of Data Reviewed Labs: ordered. Radiology: ordered.   ***  {Document critical care time  when appropriate:1} {Document review of labs and clinical decision tools ie heart score, Chads2Vasc2 etc:1}  {Document your independent review of radiology images, and any outside records:1} {Document your discussion with family members, caretakers, and with consultants:1} {Document social determinants of health affecting pt's care:1} {Document your decision making why or why not admission, treatments were needed:1} Final Clinical Impression(s) / ED Diagnoses Final diagnoses:  None    Rx / DC Orders ED Discharge Orders  None

## 2022-11-26 NOTE — ED Notes (Signed)
Patient transported to X-ray 

## 2022-11-26 NOTE — Discharge Instructions (Signed)
Contact a health care provider if: You continue to have episodes of near fainting. Get help right away if: You faint. You have any of these symptoms that may indicate trouble with your heart: Fast or irregular heartbeats (palpitations). Unusual pain in your chest, abdomen, or back. Shortness of breath. You have a seizure. You have a severe headache. You are confused. You have vision problems. You have severe weakness or trouble walking. You are bleeding from your mouth or rectum, or have black or tarry stool. These symptoms may represent a serious problem that is an emergency. Do not wait to see if your symptoms will go away. Get medical help right away. Call your local emergency services (911 in the [place] Do not drive yourself to the hospital.

## 2022-11-27 ENCOUNTER — Encounter: Payer: Self-pay | Admitting: Nurse Practitioner

## 2022-11-27 ENCOUNTER — Other Ambulatory Visit: Payer: Self-pay | Admitting: *Deleted

## 2022-11-27 ENCOUNTER — Ambulatory Visit: Payer: Medicare Other | Attending: Nurse Practitioner | Admitting: Nurse Practitioner

## 2022-11-27 VITALS — BP 108/58 | HR 69 | Ht 71.0 in | Wt 140.4 lb

## 2022-11-27 DIAGNOSIS — I4581 Long QT syndrome: Secondary | ICD-10-CM

## 2022-11-27 DIAGNOSIS — I209 Angina pectoris, unspecified: Secondary | ICD-10-CM

## 2022-11-27 DIAGNOSIS — R55 Syncope and collapse: Secondary | ICD-10-CM | POA: Diagnosis not present

## 2022-11-27 DIAGNOSIS — Z9889 Other specified postprocedural states: Secondary | ICD-10-CM

## 2022-11-27 DIAGNOSIS — I5189 Other ill-defined heart diseases: Secondary | ICD-10-CM

## 2022-11-27 DIAGNOSIS — I495 Sick sinus syndrome: Secondary | ICD-10-CM

## 2022-11-27 DIAGNOSIS — E785 Hyperlipidemia, unspecified: Secondary | ICD-10-CM | POA: Diagnosis not present

## 2022-11-27 DIAGNOSIS — G90A Postural orthostatic tachycardia syndrome (POTS): Secondary | ICD-10-CM

## 2022-11-27 DIAGNOSIS — I517 Cardiomegaly: Secondary | ICD-10-CM | POA: Diagnosis not present

## 2022-11-27 DIAGNOSIS — I471 Supraventricular tachycardia, unspecified: Secondary | ICD-10-CM

## 2022-11-27 DIAGNOSIS — I480 Paroxysmal atrial fibrillation: Secondary | ICD-10-CM

## 2022-11-27 MED ORDER — ROSUVASTATIN CALCIUM 10 MG PO TABS: 10.0000 mg | ORAL_TABLET | Freq: Every day | ORAL | 3 refills | Status: DC

## 2022-11-27 NOTE — Patient Instructions (Signed)
Medication Instructions:   START Rosuvastatin one (1) tablet by mouth ( 10 mg) daily.   *If you need a refill on your cardiac medications before your next appointment, please call your pharmacy*   Lab Work:  Your physician recommends that you return for a FASTING lipid profile in 2 months Ottie Glazier) You can come in  anytime between 7:30 am -4:30 pm fasting from midnight the night before. Closed from 12:30-1:30 for lunch.   If you have labs (blood work) drawn today and your tests are completely normal, you will receive your results only by: MyChart Message (if you have MyChart) OR A paper copy in the mail If you have any lab test that is abnormal or we need to change your treatment, we will call you to review the results.   Testing/Procedures:    Your cardiac CT will be scheduled at one of the below locations:   [facility] [address] [place], [place] [address] [phone]   If scheduled at [facility], please arrive at the [facility]'s Entrance (Entrance C2) of [facility] 30 minutes prior to test start time. You can use the FREE valet parking offered at entrance C (encouraged to control the heart rate for the test)  Proceed to the [facility] Radiology Department (first floor) to check-in and test prep.  All radiology patients and guests should use entrance C2 at [facility], accessed from [facility], even though the hospital's physical address listed is [address].    There is spacious parking and easy access to the radiology department from the [facility] entrance. Please enter here and check-in with the desk attendant.   Please follow these instructions carefully (unless otherwise directed):  An IV will be required for this test and Nitroglycerin will be given.    On the Night Before the Test: Be sure to Drink plenty of water. Do not consume any caffeinated/decaffeinated  beverages or chocolate 12 hours prior to your test. Do not take any antihistamines 12 hours prior to your test.  On the Day of the Test: Drink plenty of water until 1 hour prior to the test. Do not eat any food 1 hour prior to test. You may take your regular medications prior to the test.  Take Acebutolol (Sectral) two days prior take pm dose, one day prior take mid day dose, day of procedure take am dose of test.  Resume nightly dose next day after test.  FEMALES- please wear underwire-free bra if available, avoid dresses & tight clothing       After the Test: Drink plenty of water. After receiving IV contrast, you may experience a mild flushed feeling. This is normal. On occasion, you may experience a mild rash up to 24 hours after the test. This is not dangerous. If this occurs, you can take Benadryl 25 mg and increase your fluid intake. If you experience trouble breathing, this can be serious. If it is severe call 911 IMMEDIATELY. If it is mild, please call our office.  We will call to schedule your test 2-4 weeks out understanding that some insurance companies will need an authorization prior to the service being performed.   For more information and frequently asked questions, please visit our website : [email or web address]  For non-scheduling related questions, please contact the cardiac imaging nurse navigator should you have any questions/concerns: Cardiac Imaging Nurse Navigators Direct Office Dial: [phone]   For scheduling needs, including  cancellations and rescheduling, please call [place], [phone].    Follow-Up: At [facility], you and your health needs are our priority.  As part of our continuing mission to provide you with exceptional heart care, we have created designated Provider Care Teams.  These Care Teams include your primary Cardiologist (physician) and Advanced Practice Providers (APPs -  Physician Assistants and Nurse  Practitioners) who all work together to provide you with the care you need, when you need it.  We recommend signing up for the patient portal called "MyChart".  Sign up information is provided on this After Visit Summary.  MyChart is used to connect with patients for Virtual Visits (Telemedicine).  Patients are able to view lab/test results, encounter notes, upcoming appointments, etc.  Non-urgent messages can be sent to your provider as well.   To learn more about what you can do with MyChart, go to [email or web address].    Your next appointment:   1 month(s)  Provider:   Hurman Horn, MD

## 2022-11-28 NOTE — Progress Notes (Signed)
Remote pacemaker transmission.   

## 2022-12-08 ENCOUNTER — Telehealth: Payer: Self-pay

## 2022-12-08 NOTE — Telephone Encounter (Signed)
Transition Care Management Unsuccessful Follow-up Telephone Call  Date of discharge and from where:  11/26/2022 [facility]  Attempts:  1st Attempt  Reason for unsuccessful TCM follow-up call:  Left voice message  Ketsia Linebaugh [facility]  [facility], [facility] Resource Care Guide Direct Dial: [phone]  Website: [email or web address]

## 2022-12-11 ENCOUNTER — Telehealth: Payer: Self-pay

## 2022-12-11 ENCOUNTER — Telehealth (HOSPITAL_COMMUNITY): Payer: Self-pay | Admitting: *Deleted

## 2022-12-11 NOTE — Telephone Encounter (Signed)
Reaching out to patient to offer assistance regarding upcoming cardiac imaging study; pt verbalizes understanding of appt date/time, parking situation and where to check in, pre-test NPO status and medications ordered, and verified current allergies; name and call back number provided for further questions should they arise Hayley Sharpe RN Navigator Cardiac Imaging [facility] [phone] office [phone] cell  

## 2022-12-11 NOTE — Telephone Encounter (Signed)
Transition Care Management Follow-up Telephone Call Date of discharge and from where: 11/26/2022 [facility] How have you been since you were released from the hospital? Patient stated she is feeling ok. Any questions or concerns? No  Items Reviewed: Did the pt receive and understand the discharge instructions provided? Yes  Medications obtained and verified?  No medication prescribed Other? No  Any new allergies since your discharge? No  Dietary orders reviewed? Yes Do you have support at home? Yes   Follow up appointments reviewed:  PCP Hospital f/u appt confirmed? No  Scheduled to see  on  @ . Specialist Hospital f/u appt confirmed? Yes  Scheduled to see Levi Aland, NP on 11/27/2022 @ [facility] at [facility]. Are transportation arrangements needed? No  If their condition worsens, is the pt aware to call PCP or go to the Emergency Dept.? Yes Was the patient provided with contact information for the PCP's office or ED? Yes Was to pt encouraged to call back with questions or concerns? Yes   Dachelle Molzahn [facility]  [facility], [facility] Guide Direct Dial: [phone]  Website: [email or web address]

## 2022-12-11 NOTE — Telephone Encounter (Signed)
Attempted to call patient regarding upcoming cardiac CT appointment. Left message on voicemail with name and callback number Hayley Sharpe RN Navigator Cardiac Imaging [facility] [phone] Office   

## 2022-12-12 ENCOUNTER — Ambulatory Visit (HOSPITAL_COMMUNITY)
Admission: RE | Admit: 2022-12-12 | Discharge: 2022-12-12 | Disposition: A | Discharge status: Home / Self Care | Payer: Medicare Other | Source: Ambulatory Visit | Attending: Nurse Practitioner | Admitting: Nurse Practitioner

## 2022-12-12 DIAGNOSIS — Z9889 Other specified postprocedural states: Secondary | ICD-10-CM | POA: Insufficient documentation

## 2022-12-12 DIAGNOSIS — I495 Sick sinus syndrome: Secondary | ICD-10-CM | POA: Insufficient documentation

## 2022-12-12 DIAGNOSIS — I4581 Long QT syndrome: Secondary | ICD-10-CM | POA: Insufficient documentation

## 2022-12-12 DIAGNOSIS — I471 Supraventricular tachycardia, unspecified: Secondary | ICD-10-CM | POA: Insufficient documentation

## 2022-12-12 DIAGNOSIS — I209 Angina pectoris, unspecified: Secondary | ICD-10-CM | POA: Insufficient documentation

## 2022-12-12 MED ORDER — NITROGLYCERIN 0.4 MG SL SUBL
0.8000 mg | SUBLINGUAL_TABLET | Freq: Once | SUBLINGUAL | Status: AC
  Administered 2022-12-12: 0.8 mg via SUBLINGUAL

## 2022-12-12 MED ORDER — NITROGLYCERIN 0.4 MG SL SUBL
SUBLINGUAL_TABLET | SUBLINGUAL | Status: AC
  Filled 2022-12-12: qty 2

## 2022-12-12 MED ORDER — IOHEXOL 350 MG/ML SOLN
95.0000 mL | Freq: Once | INTRAVENOUS | Status: AC | PRN
  Administered 2022-12-12: 95 mL via INTRAVENOUS

## 2022-12-15 ENCOUNTER — Other Ambulatory Visit: Payer: Self-pay | Admitting: Internal Medicine

## 2022-12-15 DIAGNOSIS — I480 Paroxysmal atrial fibrillation: Secondary | ICD-10-CM

## 2022-12-15 NOTE — Telephone Encounter (Signed)
Eliquis 5mg  refill request received. Patient is 72 years old, weight-63.7kg, Crea-0.81 on 11/26/22, Diagnosis-Afib, and last seen by Eligha Bridegroom on 11/27/22. Dose is appropriate based on dosing criteria. Will send in refill to requested pharmacy.

## 2022-12-24 ENCOUNTER — Other Ambulatory Visit: Payer: Self-pay | Admitting: Internal Medicine

## 2022-12-24 DIAGNOSIS — E039 Hypothyroidism, unspecified: Secondary | ICD-10-CM

## 2022-12-24 DIAGNOSIS — F411 Generalized anxiety disorder: Secondary | ICD-10-CM

## 2022-12-27 ENCOUNTER — Encounter: Payer: Self-pay | Admitting: Internal Medicine

## 2022-12-27 ENCOUNTER — Ambulatory Visit: Payer: Medicare Other | Attending: Internal Medicine | Admitting: Internal Medicine

## 2022-12-27 VITALS — BP 110/68 | HR 64 | Ht 71.0 in | Wt 143.8 lb

## 2022-12-27 DIAGNOSIS — I440 Atrioventricular block, first degree: Secondary | ICD-10-CM | POA: Diagnosis not present

## 2022-12-27 DIAGNOSIS — I4581 Long QT syndrome: Secondary | ICD-10-CM | POA: Insufficient documentation

## 2022-12-27 DIAGNOSIS — G90A Postural orthostatic tachycardia syndrome (POTS): Secondary | ICD-10-CM | POA: Diagnosis not present

## 2022-12-27 DIAGNOSIS — I480 Paroxysmal atrial fibrillation: Secondary | ICD-10-CM | POA: Diagnosis not present

## 2022-12-27 DIAGNOSIS — Z950 Presence of cardiac pacemaker: Secondary | ICD-10-CM | POA: Diagnosis not present

## 2022-12-27 LAB — CUP PACEART INCLINIC DEVICE CHECK
Date Time Interrogation Session: 20241204165545
Implantable Pulse Generator Implant Date: 20110310

## 2022-12-27 NOTE — Patient Instructions (Addendum)
Medication Instructions:  Your physician recommends that you continue on your current medications as directed. Please refer to the Current Medication list given to you today.  *If you need a refill on your cardiac medications before your next appointment, please call your pharmacy*   Lab Work: None ordered.  If you have labs (blood work) drawn today and your tests are completely normal, you will receive your results only by: MyChart Message (if you have MyChart) OR A paper copy in the mail If you have any lab test that is abnormal or we need to change your treatment, we will call you to review the results.   Testing/Procedures: None ordered.    Follow-Up: At [facility], you and your health needs are our priority.  As part of our continuing mission to provide you with exceptional heart care, we have created designated Provider Care Teams.  These Care Teams include your primary Cardiologist (physician) and Advanced Practice Providers (APPs -  Physician Assistants and Nurse Practitioners) who all work together to provide you with the care you need, when you need it.  We recommend signing up for the patient portal called "MyChart".  Sign up information is provided on this After Visit Summary.  MyChart is used to connect with patients for Virtual Visits (Telemedicine).  Patients are able to view lab/test results, encounter notes, upcoming appointments, etc.  Non-urgent messages can be sent to your provider as well.   To learn more about what you can do with MyChart, go to [email or web address].    Your next appointment:   12 months with Dr Graciela Husbands  Recommended salt supplementation.  The goal is about 7-10 g of sodium, not sodium chloride, a day.  Salt supplements include oral ThermaTabs, buffered sodium preparation, SaltStick Vitassium,  Other options include NUUN.  This comes in pill form and is dissolved in liquids.  Other liquid preparations include liquid IV, Pedialyte advance  care, TRI-oral Also discussed the role of compression wear including thigh sleeves and abdominal binders.  Calf compression is not specifically recommended.Marland Kitchen

## 2022-12-27 NOTE — Progress Notes (Signed)
Patient Care Team: Etta Grandchild, MD as PCP - General (Internal Medicine) [name], MD as PCP - Cardiology (Cardiology) Duke Salvia, MD as Consulting Physician (Cardiology) Dingeldein, Viviann Spare, MD (Ophthalmology) Key, Verita Schneiders, NP as Nurse Practitioner (Gynecology) York Spaniel, MD (Inactive) as Consulting Physician (Neurology)   HPI  Amy Lane is a 72 y.o. female is seen in followup for sinus bradycardia status post pacemaker, status post generator replacement and lead repair March 2011. History of Ehlers-Danlos syndrome in the context of a marfanoid habitus, mitral valve prolapse status post mitral valve repair at the [facility] clinic with prior TIA on chronic Coumadin as well as syncope due to diagnosis of long QT syndrome. She has had significant dysautonomic symptoms consistent with POTS but most recently has been doing well   The patient denies chest pain, shortness of breath, nocturnal dyspnea, orthopnea or The patient denies chest pain, shortness of breath, nocturnal dyspnea, orthopnea or peripheral edema.  There have been no palpitations or syncope .  Complains of intermittent spells of lightheadedness and weakness associated with some diaphoresis lasting 30 seconds to about 3 minutes.  They can occur standing or seated.  They are without residual.  They are associated with a sensation of cold that comes across her shoulders and sweeps down to her feet.   These events are occurring a couple of times a week  Since the spring she has had increasing frequency of events stereotypical and similar spells she had years ago wherein she gets nauseated lightheaded presyncopal sometimes and chills and diaphoresis.  She has been witnessed to be pale.  Spells resolved.  In the wake of the spells, she is very hypertensive.  She had 1 of these spells in November prompting a call to EMS.  Her blood pressures were recorded at 190/110 or sorts and then gradually came down.   She had an echo for an episode of chest pain of the month before her echocardiogram showed normal LV function and grade 1 diastolic dysfunction as noted below.  A calcium score was 0  Date Cr K Hgb TSH  5/18 0.72  13.7 (7/18)    10/21 0.8  12.4   6/23 0.85 4.6 13.1 0.66  11/24 0./81 4.0 12.7     . DATE TEST EF   3/18 Echo   60-65 % Gd2 DD  10/24 Echo  55-60% Grade 1 DD  11/24 CAscore  Ca score 0         Past Medical History:  Diagnosis Date   Abnormal gait    Allergic rhinitis    Anxiety    Atrial fibrillation (HCC)    Carcinomas, basal cell    Cardiac pacemaker    DDD  MDT   Cerebrovascular disease    Clotting disorder (HCC)    DVT   Depression    Dysautonomia (HCC)    Ehler's-Danlos syndrome    Fibromyalgia    Headache(784.0)    Migraine   Heart murmur    Hemorrhoids    Herpes zoster    History of syncope    History of tachycardia-bradycardia syndrome    History of transient ischemic attack    Hypercholesteremia    Hypercholesterolemia    Hypothyroidism    Long term (current) use of anticoagulants    Migraine with aura 06/21/2017   Episodes of aphasia and confusion   Mitral valve prolapse    PAT (paroxysmal atrial tachycardia) (HCC)    PSVT (paroxysmal  supraventricular tachycardia) (HCC)    Skin desquamation    inflammative vaginitis   Stroke (HCC)    SVT (supraventricular tachycardia) (HCC)     Past Surgical History:  Procedure Laterality Date   ABLATION     x 2   biopsy     gum   CAROTID ENDARTERECTOMY     complex mitral valve repair     at the [facility]   INSERT / REPLACE / REMOVE PACEMAKER     LEAD REVISION     left carotid endarterectomy  11/2003   by Dr. Amada Kingfisher   PACEMAKER INSERTION     medtronic kappa 901   removed chalazion from left eye lid  09/2011   Dr. Burgess Estelle    Current Outpatient Medications  Medication Sig Dispense Refill   acebutolol (SECTRAL) 200 MG capsule TAKE 1 CAPSULE BY MOUTH EVERYDAY AT BEDTIME 90 capsule 2    citalopram (CELEXA) 20 MG tablet TAKE 1 TABLET BY MOUTH EVERY DAY 90 tablet 0   docusate sodium (COLACE) 100 MG capsule Take 100 mg by mouth every evening.     ELIQUIS 5 MG TABS tablet TAKE 1 TABLET BY MOUTH TWICE A DAY 180 tablet 1   ezetimibe (ZETIA) 10 MG tablet TAKE 1 TABLET BY MOUTH EVERY DAY 90 tablet 3   LORazepam (ATIVAN) 1 MG tablet TAKE 1 TABLET BY MOUTH EVERY 8 HOURS AS NEEDED FOR ANXIETY (Patient taking differently: Take 1 mg by mouth at bedtime. for anxiety) 90 tablet 1   rosuvastatin (CRESTOR) 10 MG tablet Take 1 tablet (10 mg total) by mouth daily. 90 tablet 3   TRULANCE 3 MG TABS TAKE 1 TABLET BY MOUTH DAILY 90 tablet 1   UNITHROID 75 MCG tablet TAKE 1 TABLET BY MOUTH DAILY BEFORE BREAKFAST. 90 tablet 0   No current facility-administered medications for this visit.    Allergies  Allergen Reactions   Dapagliflozin-Saxagliptin     Other reaction(s): bradycardia   Statins Other (See Comments)    Muscle aches   Lovastatin     Other reaction(s): myalgias (muscle pain)   Adhesive [Tape] Rash   Latex Rash    Review of Systems negative except from HPI and PMH  Physical Exam BP 110/68 (BP Location: Left Arm, Patient Position: Sitting, Cuff Size: Normal)   Pulse 64   Ht 5\' 11"  (1.803 m)   Wt 143 lb 12.8 oz (65.2 kg)   SpO2 95%   BMI 20.06 kg/m  Well developed and well nourished in no acute distress HENT normal Neck supple with JVP-flat Clear Device pocket well healed; without hematoma or erythema.  There is no tethering  Regular rate and rhythm, no  murmur Abd-soft with active BS No Clubbing cyanosis  edema Skin-warm and dry A & Oriented  Grossly normal sensory and motor function Electrocardiogram 1144 demonstrates sinus rhythm at 60 Levels 01/02/1943    Device function is normal. Programming changes none  See Paceart for details     Assessment and  Plan  Recurrent TIA  Mitral valve repair  1AVB-profound  Possible long  QT  POTS/dysautonomia  Marfanoid habitus  Atrial fibrillation-paroxysmal  Pacemaker   Spells   She continues to have intermittent spells of lightheadedness associated with epiphenomenon suggestive of a neurally mediated reflex including nausea chills and diaphoresis.  The post ictal blood pressures are very high suggestive of a sympathetic over reactivity is also supported by the fact that she had orthostatic systolic hypertension.  Trying to elucidate the trigger  as I think the first thing.  Will also have her resume her salt intake increase her fluid intake Discussed salt substitutes with a  goal of 3-4 gms of Sodium or 12-15 gm of sodium Chloride daily.  Rehydration solutions include Liquid IV, NUUN, TriOral, Normralyte pedialyte advanced care and Banana Bags.  Salt tablets include plain salt tablets, SaltStick Vitassium, thermatabs amongst others.   The higher sodium concentration per serving on this list is Within You hydration formula also at 1000 mg, normalyte about  800 mg of sodium per serving LMNT1000 mg and most recently about  Salt supplements are best used with adjunctive sugar  Also discussed the role of compression wear including thigh sleeves and abdominal binders.  Calf compression is not specifically recommended.Marland Kitchen

## 2023-01-30 ENCOUNTER — Encounter: Payer: Self-pay | Admitting: Internal Medicine

## 2023-01-30 ENCOUNTER — Ambulatory Visit: Payer: Medicare Other | Admitting: Internal Medicine

## 2023-01-30 VITALS — BP 138/64 | HR 82 | Temp 97.8°F | Resp 16 | Ht 71.0 in | Wt 144.6 lb

## 2023-01-30 DIAGNOSIS — R0789 Other chest pain: Secondary | ICD-10-CM | POA: Diagnosis not present

## 2023-01-30 DIAGNOSIS — E785 Hyperlipidemia, unspecified: Secondary | ICD-10-CM

## 2023-01-30 DIAGNOSIS — R0609 Other forms of dyspnea: Secondary | ICD-10-CM | POA: Diagnosis not present

## 2023-01-30 DIAGNOSIS — R9431 Abnormal electrocardiogram [ECG] [EKG]: Secondary | ICD-10-CM

## 2023-01-30 DIAGNOSIS — R410 Disorientation, unspecified: Secondary | ICD-10-CM | POA: Diagnosis not present

## 2023-01-30 DIAGNOSIS — E039 Hypothyroidism, unspecified: Secondary | ICD-10-CM | POA: Diagnosis not present

## 2023-01-30 LAB — CBC WITH DIFFERENTIAL/PLATELET
Basophils Absolute: 0 10*3/uL (ref 0.0–0.1)
Basophils Relative: 0.3 % (ref 0.0–3.0)
Eosinophils Absolute: 0.1 10*3/uL (ref 0.0–0.7)
Eosinophils Relative: 1.2 % (ref 0.0–5.0)
HCT: 38.8 % (ref 36.0–46.0)
Hemoglobin: 12.9 g/dL (ref 12.0–15.0)
Lymphocytes Relative: 14.9 % (ref 12.0–46.0)
Lymphs Abs: 1.1 10*3/uL (ref 0.7–4.0)
MCHC: 33.3 g/dL (ref 30.0–36.0)
MCV: 92.7 fL (ref 78.0–100.0)
Monocytes Absolute: 0.5 10*3/uL (ref 0.1–1.0)
Monocytes Relative: 7.7 % (ref 3.0–12.0)
Neutro Abs: 5.4 10*3/uL (ref 1.4–7.7)
Neutrophils Relative %: 75.9 % (ref 43.0–77.0)
Platelets: 157 10*3/uL (ref 150.0–400.0)
RBC: 4.18 Mil/uL (ref 3.87–5.11)
RDW: 13.1 % (ref 11.5–15.5)
WBC: 7.1 10*3/uL (ref 4.0–10.5)

## 2023-01-30 LAB — TROPONIN I (HIGH SENSITIVITY): High Sens Troponin I: 3 ng/L (ref 2–17)

## 2023-01-30 LAB — HEPATIC FUNCTION PANEL
ALT: 20 U/L (ref 0–35)
AST: 21 U/L (ref 0–37)
Albumin: 4.1 g/dL (ref 3.5–5.2)
Alkaline Phosphatase: 78 U/L (ref 39–117)
Bilirubin, Direct: 0.1 mg/dL (ref 0.0–0.3)
Total Bilirubin: 0.4 mg/dL (ref 0.2–1.2)
Total Protein: 7 g/dL (ref 6.0–8.3)

## 2023-01-30 LAB — BRAIN NATRIURETIC PEPTIDE: Pro B Natriuretic peptide (BNP): 125 pg/mL — ABNORMAL HIGH (ref 0.0–100.0)

## 2023-01-30 LAB — BASIC METABOLIC PANEL
BUN: 13 mg/dL (ref 6–23)
CO2: 27 meq/L (ref 19–32)
Calcium: 9.3 mg/dL (ref 8.4–10.5)
Chloride: 102 meq/L (ref 96–112)
Creatinine, Ser: 0.68 mg/dL (ref 0.40–1.20)
GFR: 88.22 mL/min (ref >60.00)
Glucose, Bld: 96 mg/dL (ref 70–99)
Potassium: 3.7 meq/L (ref 3.5–5.1)
Sodium: 137 meq/L (ref 135–145)

## 2023-01-30 LAB — LIPID PANEL
Cholesterol: 109 mg/dL (ref 0–200)
HDL: 54.5 mg/dL (ref >39.00)
LDL Cholesterol: 41 mg/dL (ref 0–99)
NonHDL: 54.52
Total CHOL/HDL Ratio: 2
Triglycerides: 67 mg/dL (ref 0.0–149.0)
VLDL: 13.4 mg/dL (ref 0.0–40.0)

## 2023-01-30 LAB — TSH: TSH: 0.04 u[IU]/mL — ABNORMAL LOW (ref 0.35–5.50)

## 2023-01-30 MED ORDER — UNITHROID 50 MCG PO TABS: 50.0000 ug | ORAL_TABLET | Freq: Every day | ORAL | 0 refills | Status: DC

## 2023-01-30 NOTE — Patient Instructions (Signed)

## 2023-01-30 NOTE — Progress Notes (Signed)
 Subjective:  Patient ID: Amy Lane, female    DOB: 11-13-1950  Age: 73 y.o. MRN: [id number]  CC: Hyperlipidemia and Hypothyroidism   HPI CALLAHAN PEDDIE presents for f/up ----  Discussed the use of AI scribe software for clinical note transcription with the patient, who gave verbal consent to proceed.  History of Present Illness   The patient, with a known diagnosis of Dysautonomia, presents with two distinct sets of symptoms. She reports episodes of shortness of breath while performing routine tasks such as making the bed, which is unusual for her. Additionally, she experienced an episode of intense jaw pressure, followed by chest pressure, which she had never felt before. During these episodes, she attempted to measure her blood pressure but repeatedly received error messages on her home device, leading her to believe her blood pressure was either too high or too low for the device to read.  The patient also reports episodes of confusion, one of which occurred while she was in her garage and lasted for approximately an hour and a half. She has not experienced any changes in her stomach symptoms.  The patient's symptoms seem to fluctuate, with some days feeling great and others experiencing the aforementioned symptoms. She has not reported any new medications or changes in her current regimen. She is due for a lipid panel and liver enzyme test.       Outpatient Medications Prior to Visit  Medication Sig Dispense Refill   acebutolol  (SECTRAL ) 200 MG capsule TAKE 1 CAPSULE BY MOUTH EVERYDAY AT BEDTIME 90 capsule 2   citalopram  (CELEXA ) 20 MG tablet TAKE 1 TABLET BY MOUTH EVERY DAY 90 tablet 0   docusate sodium  (COLACE) 100 MG capsule Take 100 mg by mouth every evening.     ELIQUIS  5 MG TABS tablet TAKE 1 TABLET BY MOUTH TWICE A DAY 180 tablet 1   ezetimibe  (ZETIA ) 10 MG tablet TAKE 1 TABLET BY MOUTH EVERY DAY 90 tablet 3   LORazepam  (ATIVAN ) 1 MG tablet TAKE 1 TABLET BY MOUTH  EVERY 8 HOURS AS NEEDED FOR ANXIETY (Patient taking differently: Take 1 mg by mouth at bedtime. for anxiety) 90 tablet 1   rosuvastatin  (CRESTOR ) 10 MG tablet Take 1 tablet (10 mg total) by mouth daily. 90 tablet 3   TRULANCE  3 MG TABS TAKE 1 TABLET BY MOUTH DAILY 90 tablet 1   UNITHROID  75 MCG tablet TAKE 1 TABLET BY MOUTH DAILY BEFORE BREAKFAST. 90 tablet 0   No facility-administered medications prior to visit.    ROS Review of Systems  Constitutional:  Positive for unexpected weight change (wt gain). Negative for appetite change, diaphoresis and fatigue.  HENT: Negative.    Eyes: Negative.   Respiratory:  Positive for shortness of breath. Negative for cough, chest tightness and wheezing.   Cardiovascular:  Positive for chest pain. Negative for palpitations and leg swelling.  Gastrointestinal: Negative.  Negative for abdominal pain, constipation, diarrhea, nausea and vomiting.  Endocrine: Negative for cold intolerance and heat intolerance.  Genitourinary: Negative.  Negative for difficulty urinating and dysuria.  Musculoskeletal: Negative.  Negative for arthralgias, back pain, myalgias and neck pain.  Skin: Negative.  Negative for color change and pallor.  Neurological: Negative.  Negative for dizziness and weakness.  Hematological:  Negative for adenopathy. Does not bruise/bleed easily.  Psychiatric/Behavioral:  Positive for confusion and decreased concentration.     Objective:  BP 138/64 (BP Location: Left Arm, Patient Position: Sitting, Cuff Size: Normal)   Pulse 82  Temp 97.8 F (36.6 C) (Oral)   Resp 16   Ht 5' 11 (1.803 m)   Wt 144 lb 9.6 oz (65.6 kg)   SpO2 98%   BMI 20.17 kg/m   BP Readings from Last 3 Encounters:  01/30/23 138/64  12/27/22 110/68  12/12/22 (!) 112/51    Wt Readings from Last 3 Encounters:  01/30/23 144 lb 9.6 oz (65.6 kg)  12/27/22 143 lb 12.8 oz (65.2 kg)  11/27/22 140 lb 6.4 oz (63.7 kg)    Physical Exam Vitals reviewed.   Constitutional:      Appearance: Normal appearance.  HENT:     Mouth/Throat:     Mouth: Mucous membranes are moist.  Eyes:     General: No scleral icterus.    Conjunctiva/sclera: Conjunctivae normal.  Cardiovascular:     Rate and Rhythm: Normal rate and regular rhythm. Occasional Extrasystoles are present.    Heart sounds: Normal heart sounds, S1 normal and S2 normal. No murmur heard.    No friction rub. No gallop.     Comments: EKG- SR with 1st degree AV block with occasional PVC, 62 bpm RAD, RVH TWI in III is new Unchanged anterior TW changes Pulmonary:     Effort: Pulmonary effort is normal.     Breath sounds: No wheezing, rhonchi or rales.  Chest:     Chest wall: No tenderness.  Abdominal:     General: Abdomen is flat.     Palpations: There is no mass.     Tenderness: There is no abdominal tenderness. There is no guarding.     Hernia: No hernia is present.  Musculoskeletal:     Cervical back: Neck supple.     Right lower leg: No edema.     Left lower leg: No edema.  Skin:    General: Skin is warm and dry.     Coloration: Skin is not pale.  Neurological:     General: No focal deficit present.     Mental Status: She is alert. Mental status is at baseline.  Psychiatric:        Mood and Affect: Mood normal.        Behavior: Behavior normal.        Thought Content: Thought content normal.        Judgment: Judgment normal.     Lab Results  Component Value Date   WBC 7.1 01/30/2023   HGB 12.9 01/30/2023   HCT 38.8 01/30/2023   PLT 157.0 01/30/2023   GLUCOSE 96 01/30/2023   CHOL 109 01/30/2023   TRIG 67.0 01/30/2023   HDL 54.50 01/30/2023   LDLDIRECT 101.0 10/07/2018   LDLCALC 41 01/30/2023   ALT 20 01/30/2023   AST 21 01/30/2023   NA 137 01/30/2023   K 3.7 01/30/2023   CL 102 01/30/2023   CREATININE 0.68 01/30/2023   BUN 13 01/30/2023   CO2 27 01/30/2023   TSH 0.04 (L) 01/30/2023   INR 1.11 06/19/2017   HGBA1C 5.5 06/20/2017    CT CORONARY MORPH  W/CTA COR W/SCORE W/CA W/CM &/OR WO/CM Addendum Date: 12/30/2022 ADDENDUM REPORT: 12/30/2022 20:46 EXAM: OVER-READ INTERPRETATION  CT CHEST The following report is an over-read performed by radiologist Dr. Oneil Devonshire of [facility] on 12/30/2022. This over-read does not include interpretation of cardiac or coronary anatomy or pathology. The coronary calcium  score/coronary CTA interpretation by the cardiologist is attached. COMPARISON:  04/24/2007 FINDINGS: Cardiovascular: There are no significant extracardiac vascular findings. Mediastinum/Nodes: Multiple calcified lymph nodes  are identified most consistent with prior granulomatous disease. These are stable dating back to 2009. Circumferential thickening of the distal esophagus is noted likely related to reflux. No discrete mass is seen. Lungs/Pleura: There is no pleural effusion. The visualized lungs appear clear. Upper abdomen: No significant findings in the visualized upper abdomen. Musculoskeletal/Chest wall: No chest wall mass or suspicious osseous findings within the visualized chest. IMPRESSION: Changes consistent with prior granulomatous disease. Circumferential thickening of the distal esophagus likely related to reflux. Correlate with the clinical history. Electronically Signed   By: Oneil Devonshire M.D.   On: 12/30/2022 20:46   Result Date: 12/30/2022 HISTORY: Chest pain/anginal equiv, high CAD risk, not treadmill candidate EXAM: Cardiac/Coronary CT TECHNIQUE: The patient was scanned on a [employer]. PROTOCOL: A 120 kV prospective scan was triggered in the descending thoracic aorta at 111 HU's. Axial non-contrast 3 mm slices were carried out through the heart. The data set was analyzed on a dedicated work station and scored using the Agatston method. Gantry rotation speed was 250 msecs and collimation was 0.6 mm. Heart rate was optimized medically and sl NTG was given. The 3D data set was reconstructed in 5% intervals of the 35-75  % of the R-R cycle. Systolic and diastolic phases were analyzed on a dedicated work station using MPR, MIP and VRT modes. The patient received 95mL OMNIPAQUE  IOHEXOL  350 MG/ML SOLN of contrast. FINDINGS: Coronary calcium  score: The patient's coronary artery calcium  score is 0, which places the patient in the 0-39th percentile. Coronary arteries: Normal variant, LAD and LCx arise from separate ostia. Co-dominance. Right Coronary Artery: Normal caliber vessel, gives rise to small co-dominant PDA. There is a conus branch arising from separate ostium of RCC. No significant plaque or stenosis. Left Anterior Descending Coronary Artery: Arises directly from aortic root. Normal caliber vessel. No significant plaque or stenosis. Gives rise to two small diagonal branches. Left Circumflex Artery: Arises directly from aortic root. Normal caliber vessel, gives rise to small co-dominant PDA. No significant plaque or stenosis. Gives rise to two small OM branches. Aorta: Normal size, 28 mm at the mid ascending aorta (level of the PA bifurcation) measured double oblique. No aortic atherosclerosis. No dissection seen in visualized portions of the aorta. Aortic Valve: No calcifications. Trileaflet. Mitral valve: S/p repair Other findings: Normal pulmonary vein drainage into the left atrium. Normal left atrial appendage without a thrombus. Normal size of the pulmonary artery. Normal appearance of the pericardium. Pacemaker leads present. IMPRESSION: 1. No evidence of CAD, CADRADS = 0. 2. Coronary calcium  score of 0. This was 0-39th percentile for age-, sex-, and race- matched controls. 3. Normal variant coronary origin with co- dominance. LAD and LCX arise from separate ostia. Conus branch arises from separate ostium. INTERPRETATION: CAD-RADS 0: No evidence of CAD (0%). Consider non-atherosclerotic causes of chest pain Electronically Signed: By: Shelda Bruckner M.D. On: 12/13/2022 10:21    Assessment & Plan:   Acquired  hypothyroidism- Will lower her T4 dose. -     TSH; Future -     Basic metabolic panel; Future -     CBC with Differential/Platelet; Future -     Unithroid ; Take 1 tablet (50 mcg total) by mouth daily before breakfast.  Dispense: 90 tablet; Refill: 0  Abnormal electrocardiogram (ECG) (EKG) -     Troponin I (High Sensitivity); Future -     Brain natriuretic peptide; Future  DOE (dyspnea on exertion) -     EKG 12-Lead -  Troponin I (High Sensitivity); Future -     Brain natriuretic peptide; Future  Sensation of chest pressure- Recent CTA was reassuriing. -     EKG 12-Lead -     Troponin I (High Sensitivity); Future -     Brain natriuretic peptide; Future  Hyperlipidemia LDL goal <130 -     Lipid panel; Future -     Hepatic function panel; Future  Confusion and disorientation- This was brief and has resolved.     Follow-up: Return in about 3 months (around 04/30/2023).  Debby Molt, MD

## 2023-02-12 ENCOUNTER — Ambulatory Visit (INDEPENDENT_AMBULATORY_CARE_PROVIDER_SITE_OTHER): Payer: Medicare Other

## 2023-02-12 DIAGNOSIS — I495 Sick sinus syndrome: Secondary | ICD-10-CM

## 2023-02-14 LAB — CUP PACEART REMOTE DEVICE CHECK
Battery Impedance: 3334 Ohm
Battery Remaining Longevity: 20 mo
Battery Voltage: 2.71 V
Brady Statistic AP VP Percent: 0 %
Brady Statistic AP VS Percent: 80 %
Brady Statistic AS VP Percent: 0 %
Brady Statistic AS VS Percent: 20 %
Date Time Interrogation Session: 20250121134443
Implantable Pulse Generator Implant Date: 20110310
Lead Channel Impedance Value: 402 Ohm
Lead Channel Impedance Value: 641 Ohm
Lead Channel Pacing Threshold Amplitude: 0.875 V
Lead Channel Pacing Threshold Amplitude: 1 V
Lead Channel Pacing Threshold Pulse Width: 0.4 ms
Lead Channel Pacing Threshold Pulse Width: 0.4 ms
Lead Channel Setting Pacing Amplitude: 2 V
Lead Channel Setting Pacing Amplitude: 2.5 V
Lead Channel Setting Pacing Pulse Width: 0.4 ms
Lead Channel Setting Sensing Sensitivity: 2.8 mV
Zone Setting Status: 755011
Zone Setting Status: 755011

## 2023-02-19 ENCOUNTER — Encounter: Payer: Self-pay | Admitting: Diagnostic Neuroimaging

## 2023-02-19 ENCOUNTER — Ambulatory Visit (INDEPENDENT_AMBULATORY_CARE_PROVIDER_SITE_OTHER): Payer: Medicare Other | Admitting: Diagnostic Neuroimaging

## 2023-02-19 VITALS — BP 131/65 | HR 74 | Ht 71.0 in | Wt 145.2 lb

## 2023-02-19 DIAGNOSIS — R410 Disorientation, unspecified: Secondary | ICD-10-CM

## 2023-02-19 NOTE — Progress Notes (Signed)
[facility]  PATIENT: Amy Lane DOB: November 17, 1950  REFERRING CLINICIAN: Etta Grandchild, MD HISTORY FROM: patient  REASON FOR VISIT: follow up   HISTORICAL  CHIEF COMPLAINT:  Chief Complaint  Patient presents with   Follow-up    Pt in room 7 alone. Here for migraine follow up. Pt reports no migraines.     HISTORY OF PRESENT ILLNESS:   UPDATE (02/19/23, VRP): Since last visit, doing well. Symptoms are stable. No recurrent major spells. No alleviating or aggravating factors. Tolerating meds.    PRIOR HPI (04/20/21): 73 year old female here for evaluation of transient confusion.  History of Ehlers-Danlos, stroke, migraine.  Patient had first stroke at age 25 years old.  At age 39 years old she had pacemaker placed.  Is also been on anticoagulation for atrial fibrillation for many years.  Since that time she has had about 10 events throughout her life consisting of unilateral numbness, weakness, facial droop, speech difficulty.  4 of these may have been TIA events.  The remaining events may have been complicated migraine variants.  Patient had previously been treated with topiramate for migraine with good results.  Patient did work very well over several years and was weaned off of topiramate several years ago.  04/16/2021 patient had another event of transient confusion, difficulty reading highway signs, difficulty expressing herself.  Husband was with her in the car.  She seems somewhat confused.  Symptoms lasted for about an hour.  She went to the hospital for evaluation and had CT of the head and other testing which were unremarkable.  EEG was normal.  Mild hyponatremia was noted.  Symptoms resolved and patient was discharged home.   REVIEW OF SYSTEMS: Full 14 system review of systems performed and negative with exception of: as per HPI.  ALLERGIES: Allergies  Allergen Reactions   Dapagliflozin-Saxagliptin     Other reaction(s): bradycardia   Statins  Other (See Comments)    Muscle aches   Lovastatin     Other reaction(s): myalgias (muscle pain)   Adhesive [Tape] Rash   Latex Rash    HOME MEDICATIONS: Outpatient Medications Prior to Visit  Medication Sig Dispense Refill   acebutolol (SECTRAL) 200 MG capsule TAKE 1 CAPSULE BY MOUTH EVERYDAY AT BEDTIME 90 capsule 2   citalopram (CELEXA) 20 MG tablet TAKE 1 TABLET BY MOUTH EVERY DAY 90 tablet 0   docusate sodium (COLACE) 100 MG capsule Take 100 mg by mouth every evening.     ELIQUIS 5 MG TABS tablet TAKE 1 TABLET BY MOUTH TWICE A DAY 180 tablet 1   ezetimibe (ZETIA) 10 MG tablet TAKE 1 TABLET BY MOUTH EVERY DAY 90 tablet 3   LORazepam (ATIVAN) 1 MG tablet TAKE 1 TABLET BY MOUTH EVERY 8 HOURS AS NEEDED FOR ANXIETY (Patient taking differently: Take 1 mg by mouth at bedtime. for anxiety) 90 tablet 1   rosuvastatin (CRESTOR) 10 MG tablet Take 1 tablet (10 mg total) by mouth daily. 90 tablet 3   TRULANCE 3 MG TABS TAKE 1 TABLET BY MOUTH DAILY 90 tablet 1   UNITHROID 50 MCG tablet Take 1 tablet (50 mcg total) by mouth daily before breakfast. 90 tablet 0   No facility-administered medications prior to visit.    PAST MEDICAL HISTORY: Past Medical History:  Diagnosis Date   Abnormal gait    Allergic rhinitis    Anxiety    Atrial fibrillation (HCC)    Carcinomas, basal cell    Cardiac pacemaker  DDD  MDT   Cerebrovascular disease    Clotting disorder (HCC)    DVT   Depression    Dysautonomia (HCC)    Ehler's-Danlos syndrome    Fibromyalgia    Headache(784.0)    Migraine   Heart murmur    Hemorrhoids    Herpes zoster    History of syncope    History of tachycardia-bradycardia syndrome    History of transient ischemic attack    Hypercholesteremia    Hypercholesterolemia    Hypothyroidism    Long term (current) use of anticoagulants    Migraine with aura 06/21/2017   Episodes of aphasia and confusion   Mitral valve prolapse    PAT (paroxysmal atrial tachycardia) (HCC)     PSVT (paroxysmal supraventricular tachycardia) (HCC)    Skin desquamation    inflammative vaginitis   Stroke (HCC)    SVT (supraventricular tachycardia) (HCC)     PAST SURGICAL HISTORY: Past Surgical History:  Procedure Laterality Date   ABLATION     x 2   biopsy     gum   CAROTID ENDARTERECTOMY     complex mitral valve repair     at the [facility]   INSERT / REPLACE / REMOVE PACEMAKER     LEAD REVISION     left carotid endarterectomy  11/2003   by Dr. Amada Kingfisher   PACEMAKER INSERTION     medtronic kappa [id number]   removed chalazion from left eye lid  09/2011   Dr. Burgess Estelle    FAMILY HISTORY: Family History  Problem Relation Age of Onset   Lung cancer Mother    Prostate cancer Father    Alzheimer's disease Father    Heart disease Father    Ehlers-Danlos syndrome Father    Mitral valve prolapse Brother    Diabetes Sister    Mitral valve prolapse Sister    Leukemia Other        Nephew   Heart disease Other        entire maternal side of family   Cancer Other        entire maternal side of family    SOCIAL HISTORY: Social History   Socioeconomic History   Marital status: Married    Spouse name: Peyton Najjar   Number of children: 0   Years of education: S-College   Highest education level: Some college, no degree  Occupational History   Occupation: retired    Associate Professor: RETIRED  Tobacco Use   Smoking status: Never   Smokeless tobacco: Never   Tobacco comments:    smoked in her early 20's--smoked 4 cigs per year   Vaping Use   Vaping status: Never Used  Substance and Sexual Activity   Alcohol use: No    Alcohol/week: 0.0 standard drinks of alcohol    Comment: 2 drinks per year   Drug use: No   Sexual activity: Not on file  Other Topics Concern   Not on file  Social History Narrative   Lives with spouse   Patient drinks about 2 cups of caffeine daily.   Patient is right handed.    Social Drivers of Corporate investment banker Strain: Low Risk  (01/28/2023)    Overall Financial Resource Strain (CARDIA)    Difficulty of Paying Living Expenses: Not hard at all  Food Insecurity: No Food Insecurity (01/28/2023)   Hunger Vital Sign    Worried About Running Out of Food in the Last Year: Never true    Ran  Out of Food in the Last Year: Never true  Transportation Needs: No Transportation Needs (01/28/2023)   PRAPARE - Administrator, Civil Service (Medical): No    Lack of Transportation (Non-Medical): No  Physical Activity: Insufficiently Active (01/28/2023)   Exercise Vital Sign    Days of Exercise per Week: 1 day    Minutes of Exercise per Session: 20 min  Stress: No Stress Concern Present (01/28/2023)   [employer] of Occupational Health - Occupational Stress Questionnaire    Feeling of Stress : Not at all  Social Connections: Socially Integrated (01/28/2023)   Social Connection and Isolation Panel [NHANES]    Frequency of Communication with Friends and Family: More than three times a week    Frequency of Social Gatherings with Friends and Family: Twice a week    Attends Religious Services: More than 4 times per year    Active Member of [employer] or Organizations: No    Attends Engineer, structural: More than 4 times per year    Marital Status: Married  Catering manager Violence: Not At Risk (02/28/2022)   Humiliation, Afraid, Rape, and Kick questionnaire    Fear of Current or Ex-Partner: No    Emotionally Abused: No    Physically Abused: No    Sexually Abused: No     PHYSICAL EXAM  GENERAL EXAM/CONSTITUTIONAL: Vitals:  Vitals:   02/19/23 1618  BP: 131/65  Pulse: 74  Weight: 145 lb 3.2 oz (65.9 kg)  Height: 5\' 11"  (1.803 m)   Body mass index is 20.25 kg/m. Wt Readings from Last 3 Encounters:  02/19/23 145 lb 3.2 oz (65.9 kg)  01/30/23 144 lb 9.6 oz (65.6 kg)  12/27/22 143 lb 12.8 oz (65.2 kg)   Patient is in no distress; well developed, nourished and groomed; neck is supple  CARDIOVASCULAR: Examination of  carotid arteries is normal; no carotid bruits Regular rate and rhythm, no murmurs Examination of peripheral vascular system by observation and palpation is normal  EYES: Ophthalmoscopic exam of optic discs and posterior segments is normal; no papilledema or hemorrhages No results found.  MUSCULOSKELETAL: Gait, strength, tone, movements noted in Neurologic exam below  NEUROLOGIC: MENTAL STATUS:     06/21/2017   10:57 AM 06/20/2016   12:00 PM 12/23/2015   10:19 AM  MMSE - Mini Mental State Exam  Orientation to time 5 4 4   Orientation to Place 5 5 5   Registration 3 3 3   Attention/ Calculation 4 5 5   Recall 3 2 2   Language- name 2 objects 2 2 2   Language- repeat 1 1 1   Language- follow 3 step command 3 3 3   Language- read & follow direction 1 1 1   Write a sentence 1 1 1   Copy design 1 1 1   Total score 29 28 28    awake, alert, oriented to person, place and time recent and remote memory intact normal attention and concentration language fluent, comprehension intact, naming intact fund of knowledge appropriate  CRANIAL NERVE:  2nd - no papilledema on fundoscopic exam 2nd, 3rd, 4th, 6th - pupils equal and reactive to light, visual fields full to confrontation, extraocular muscles intact, no nystagmus 5th - facial sensation symmetric 7th - facial strength symmetric 8th - hearing intact 9th - palate elevates symmetrically, uvula midline 11th - shoulder shrug symmetric 12th - tongue protrusion midline  MOTOR:  normal bulk and tone, full strength in the BUE, BLE  SENSORY:  normal and symmetric to light touch, temperature, vibration  COORDINATION:  finger-nose-finger, fine finger movements normal  REFLEXES:  deep tendon reflexes TRACE and symmetric  GAIT/STATION:  narrow based gait    DIAGNOSTIC DATA (LABS, IMAGING, TESTING) - I reviewed patient records, labs, notes, testing and imaging myself where available.  Lab Results  Component Value Date   WBC 7.1  01/30/2023   HGB 12.9 01/30/2023   HCT 38.8 01/30/2023   MCV 92.7 01/30/2023   PLT 157.0 01/30/2023      Component Value Date/Time   NA 137 01/30/2023 1125   NA 136 11/06/2019 1518   K 3.7 01/30/2023 1125   CL 102 01/30/2023 1125   CO2 27 01/30/2023 1125   GLUCOSE 96 01/30/2023 1125   BUN 13 01/30/2023 1125   BUN 15 11/06/2019 1518   CREATININE 0.68 01/30/2023 1125   CALCIUM 9.3 01/30/2023 1125   PROT 7.0 01/30/2023 1125   ALBUMIN 4.1 01/30/2023 1125   AST 21 01/30/2023 1125   ALT 20 01/30/2023 1125   ALKPHOS 78 01/30/2023 1125   BILITOT 0.4 01/30/2023 1125   GFRNONAA >60 11/26/2022 1550   GFRAA 88 11/06/2019 1518   Lab Results  Component Value Date   CHOL 109 01/30/2023   HDL 54.50 01/30/2023   LDLCALC 41 01/30/2023   LDLDIRECT 101.0 10/07/2018   TRIG 67.0 01/30/2023   CHOLHDL 2 01/30/2023   Lab Results  Component Value Date   HGBA1C 5.5 06/20/2017   Lab Results  Component Value Date   VITAMINB12 653 12/27/2021   Lab Results  Component Value Date   TSH 0.04 (L) 01/30/2023    04/17/21 CT head  - No acute intracranial abnormality.   04/16/21  CT HEAD: ASPECTS Score of 10. No acute intracranial hemorrhage.  CTA BRAIN: No evidence of intracranial large vessel occlusion or significant stenosis.  CTA NECK: No evidence of significant ICA stenosis by NASCET criteria.   CT PERFUSION: Symmetric perfusion without evidence of cerebral ischemia.     ASSESSMENT AND PLAN  73 y.o. year old female here with:   Dx:  1. Transient confusion     PLAN:  TRANSIENT CONFUSION / APHASIA (also ~10 events of various neurologic events since age 73 years old; some TIA, some migraine; 1st event was a stroke) - ddx: complicated migraine vs TIA - continue eliquis, zetia - monitor for migraine in future; may consider topiramate if needed  Return for return to PCP.    Suanne Marker, MD 02/19/2023, 4:26 PM Certified in Neurology, Neurophysiology and  Neuroimaging  [facility] [address], [address] [place], [place] [address] [phone]

## 2023-03-02 ENCOUNTER — Ambulatory Visit (INDEPENDENT_AMBULATORY_CARE_PROVIDER_SITE_OTHER): Payer: Medicare Other

## 2023-03-02 DIAGNOSIS — Z0000 Encounter for general adult medical examination without abnormal findings: Secondary | ICD-10-CM

## 2023-03-02 NOTE — Patient Instructions (Signed)
 Ms. Vencill , Thank you for taking time to come for your Medicare Wellness Visit. I appreciate your ongoing commitment to your health goals. Please review the following plan we discussed and let me know if I can assist you in the future.   Referrals/Orders/Follow-Ups/Clinician Recommendations: none  This is a list of the screening recommended for you and due dates:  Health Maintenance  Topic Date Due   COVID-19 Vaccine (4 - 2024-25 season) 09/24/2022   Mammogram  02/17/2023   Medicare Annual Wellness Visit  03/01/2024   Colon Cancer Screening  01/05/2028   DTaP/Tdap/Td vaccine (3 - Td or Tdap) 05/14/2030   Pneumonia Vaccine  Completed   Flu Shot  Completed   DEXA scan (bone density measurement)  Completed   Hepatitis C Screening  Completed   Zoster (Shingles) Vaccine  Completed   HPV Vaccine  Aged Out    Advanced directives: (ACP Link)Information on Advanced Care Planning can be found at [place]  Secretary of State Advance Health Care Directives Advance Health Care Directives ([email or web address]   Next Medicare Annual Wellness Visit scheduled for next year: Yes  insert Preventive Care attachment Insert FALL PREVENTION attachment if needed

## 2023-03-02 NOTE — Progress Notes (Signed)
 Subjective:   Amy Lane is a 73 y.o. female who presents for Medicare Annual (Subsequent) preventive examination.  Visit Complete: Virtual I connected with  Avelina ONEIDA Cardinal on 03/02/23 by a audio enabled telemedicine application and verified that I am speaking with the correct person using two identifiers. Interactive audio and video telecommunications were attempted between this provider and patient, however failed, due to patient having technical difficulties OR patient did not have access to video capability.  We continued and completed visit with audio only.  Patient Location: Home  Provider Location: Home Office  I discussed the limitations of evaluation and management by telemedicine. The patient expressed understanding and agreed to proceed.  Vital Signs: Because this visit was a virtual/telehealth visit, some criteria may be missing or patient reported. Any vitals not documented were not able to be obtained and vitals that have been documented are patient reported.  Patient Medicare AWV questionnaire was completed by the patient on 02/28/2023; I have confirmed that all information answered by patient is correct and no changes since this date.  Cardiac Risk Factors include: advanced age (>65men, >68 women);dyslipidemia     Objective:    Today's Vitals   There is no height or weight on file to calculate BMI.     03/02/2023   11:21 AM 11/26/2022    3:43 PM 07/31/2022    8:07 PM 02/28/2022    2:40 PM 02/08/2021    1:08 PM 12/09/2019   11:20 AM 01/03/2019    8:55 AM  Advanced Directives  Does Patient Have a Medical Advance Directive? No Yes No Yes Yes Yes No  Type of Special Educational Needs Teacher of [place];Living will  Healthcare Power of [place];Living will Living will;Healthcare Power of [employer] Power of [place];Living will   Does patient want to make changes to medical advance directive?  No - Patient declined   No - Patient declined    Copy of  Healthcare Power of Attorney in Chart?  No - copy requested, Physician notified  No - copy requested No - copy requested No - copy requested   Would patient like information on creating a medical advance directive?   No - Patient declined        Current Medications (verified) Outpatient Encounter Medications as of 03/02/2023  Medication Sig   acebutolol  (SECTRAL ) 200 MG capsule TAKE 1 CAPSULE BY MOUTH EVERYDAY AT BEDTIME   citalopram  (CELEXA ) 20 MG tablet TAKE 1 TABLET BY MOUTH EVERY DAY   docusate sodium  (COLACE) 100 MG capsule Take 100 mg by mouth every evening.   ELIQUIS  5 MG TABS tablet TAKE 1 TABLET BY MOUTH TWICE A DAY   ezetimibe  (ZETIA ) 10 MG tablet TAKE 1 TABLET BY MOUTH EVERY DAY   LORazepam  (ATIVAN ) 1 MG tablet TAKE 1 TABLET BY MOUTH EVERY 8 HOURS AS NEEDED FOR ANXIETY (Patient taking differently: Take 1 mg by mouth at bedtime. for anxiety)   UNITHROID  50 MCG tablet Take 1 tablet (50 mcg total) by mouth daily before breakfast.   rosuvastatin  (CRESTOR ) 10 MG tablet Take 1 tablet (10 mg total) by mouth daily.   TRULANCE  3 MG TABS TAKE 1 TABLET BY MOUTH DAILY   No facility-administered encounter medications on file as of 03/02/2023.    Allergies (verified) Dapagliflozin-saxagliptin, Statins, Lovastatin, Adhesive [tape], and Latex   History: Past Medical History:  Diagnosis Date   Abnormal gait    Allergic rhinitis    Anxiety    Atrial fibrillation (HCC)  Carcinomas, basal cell    Cardiac pacemaker    DDD  MDT   Cerebrovascular disease    Clotting disorder (HCC)    DVT   Depression    Dysautonomia (HCC)    Ehler's-Danlos syndrome    Fibromyalgia    Headache(784.0)    Migraine   Heart murmur    Hemorrhoids    Herpes zoster    History of syncope    History of tachycardia-bradycardia syndrome    History of transient ischemic attack    Hypercholesteremia    Hypercholesterolemia    Hypothyroidism    Long term (current) use of anticoagulants    Migraine with aura  06/21/2017   Episodes of aphasia and confusion   Mitral valve prolapse    PAT (paroxysmal atrial tachycardia) (HCC)    PSVT (paroxysmal supraventricular tachycardia) (HCC)    Skin desquamation    inflammative vaginitis   Stroke (HCC)    SVT (supraventricular tachycardia) (HCC)    Past Surgical History:  Procedure Laterality Date   ABLATION     x 2   biopsy     gum   CAROTID ENDARTERECTOMY     complex mitral valve repair     at the [facility]   INSERT / REPLACE / REMOVE PACEMAKER     LEAD REVISION     left carotid endarterectomy  11/2003   by Dr. JUDITHANN Hint   PACEMAKER INSERTION     medtronic kappa 901   removed chalazion from left eye lid  09/2011   Dr. Patrcia   Family History  Problem Relation Age of Onset   Lung cancer Mother    Prostate cancer Father    Alzheimer's disease Father    Heart disease Father    Ehlers-Danlos syndrome Father    Mitral valve prolapse Brother    Diabetes Sister    Mitral valve prolapse Sister    Leukemia Other        Nephew   Heart disease Other        entire maternal side of family   Cancer Other        entire maternal side of family   Social History   Socioeconomic History   Marital status: Married    Spouse name: Ubaldo   Number of children: 0   Years of education: S-College   Highest education level: Some college, no degree  Occupational History   Occupation: retired    Associate Professor: RETIRED  Tobacco Use   Smoking status: Never   Smokeless tobacco: Never   Tobacco comments:    smoked in her early 20's--smoked 4 cigs per year   Vaping Use   Vaping status: Never Used  Substance and Sexual Activity   Alcohol use: No    Alcohol/week: 0.0 standard drinks of alcohol    Comment: 2 drinks per year   Drug use: No   Sexual activity: Not on file  Other Topics Concern   Not on file  Social History Narrative   Lives with spouse   Patient drinks about 2 cups of caffeine daily.   Patient is right handed.    Social Drivers of  Corporate Investment Banker Strain: Low Risk  (03/02/2023)   Overall Financial Resource Strain (CARDIA)    Difficulty of Paying Living Expenses: Not hard at all  Food Insecurity: No Food Insecurity (03/02/2023)   Hunger Vital Sign    Worried About Running Out of Food in the Last Year: Never true  Ran Out of Food in the Last Year: Never true  Transportation Needs: No Transportation Needs (03/02/2023)   PRAPARE - Administrator, Civil Service (Medical): No    Lack of Transportation (Non-Medical): No  Physical Activity: Inactive (03/02/2023)   Exercise Vital Sign    Days of Exercise per Week: 0 days    Minutes of Exercise per Session: 0 min  Stress: No Stress Concern Present (03/02/2023)   [employer] of Occupational Health - Occupational Stress Questionnaire    Feeling of Stress : Not at all  Social Connections: Moderately Integrated (03/02/2023)   Social Connection and Isolation Panel [NHANES]    Frequency of Communication with Friends and Family: More than three times a week    Frequency of Social Gatherings with Friends and Family: More than three times a week    Attends Religious Services: More than 4 times per year    Active Member of [employer] or Organizations: No    Attends Engineer, Structural: Never    Marital Status: Married    Tobacco Counseling Counseling given: Not Answered Tobacco comments: smoked in her early 20's--smoked 4 cigs per year    Clinical Intake:  Pre-visit preparation completed: Yes  Pain : No/denies pain     Nutritional Risks: None Diabetes: No  How often do you need to have someone help you when you read instructions, pamphlets, or other written materials from your doctor or pharmacy?: 1 - Never  Interpreter Needed?: No  Information entered by :: NAllen LPN   Activities of Daily Living    02/28/2023    4:46 PM  In your present state of health, do you have any difficulty performing the following activities:  Hearing? 0   Vision? 0  Difficulty concentrating or making decisions? 0  Walking or climbing stairs? 0  Dressing or bathing? 0  Doing errands, shopping? 0  Preparing Food and eating ? N  Using the Toilet? N  In the past six months, have you accidently leaked urine? N  Do you have problems with loss of bowel control? N  Managing your Medications? N  Managing your Finances? N  Housekeeping or managing your Housekeeping? N    Patient Care Team: Joshua Debby CROME, MD as PCP - General (Internal Medicine) Delford Maude BROCKS, MD as PCP - Cardiology (Cardiology) Fernande Elspeth BROCKS, MD as Consulting Physician (Cardiology) Dingeldein, Elspeth, MD (Ophthalmology) Key, Hargis HERO, NP as Nurse Practitioner (Gynecology) Jenel Carlin POUR, MD (Inactive) as Consulting Physician (Neurology)  Indicate any recent Medical Services you may have received from other than [facility] providers in the past year (date may be approximate).     Assessment:   This is a routine wellness examination for Carleen.  Hearing/Vision screen Hearing Screening - Comments:: Denies hearing issues Vision Screening - Comments:: Regular eye exams, Dr. Coreen   Goals Addressed             This Visit's Progress    Patient Stated       03/02/2023, wants to be more active       Depression Screen    03/02/2023   11:22 AM 02/28/2022    2:30 PM 07/28/2021    3:33 PM 02/08/2021   12:58 PM 12/09/2019   11:24 AM 02/17/2019    3:27 PM 12/04/2018    2:43 PM  PHQ 2/9 Scores  PHQ - 2 Score 0 0 0 0 0 0 0  PHQ- 9 Score 0  0  Fall Risk    02/28/2023    4:46 PM 02/28/2022    3:11 PM 07/28/2021    3:34 PM 02/08/2021   12:59 PM 11/15/2020   11:08 AM  Fall Risk   Falls in the past year? 0 0 0 0 0  Number falls in past yr: 0 0  0   Injury with Fall? 0 0 0 0   Risk for fall due to : Medication side effect No Fall Risks  No Fall Risks   Follow up Falls prevention discussed;Falls evaluation completed Falls prevention discussed  Falls evaluation  completed     MEDICARE RISK AT HOME: Medicare Risk at Home Any stairs in or around the home?: (Patient-Rptd) Yes If so, are there any without handrails?: (Patient-Rptd) No Home free of loose throw rugs in walkways, pet beds, electrical cords, etc?: (Patient-Rptd) Yes Adequate lighting in your home to reduce risk of falls?: (Patient-Rptd) Yes Life alert?: (Patient-Rptd) No Use of a cane, walker or w/c?: (Patient-Rptd) No Grab bars in the bathroom?: (Patient-Rptd) No Shower chair or bench in shower?: (Patient-Rptd) No Elevated toilet seat or a handicapped toilet?: (Patient-Rptd) Yes  TIMED UP AND GO:  Was the test performed?  No    Cognitive Function:    06/21/2017   10:57 AM 06/20/2016   12:00 PM 12/23/2015   10:19 AM 06/15/2015   11:09 AM 12/11/2014   12:04 PM  MMSE - Mini Mental State Exam  Orientation to time 5 4 4 5 4   Orientation to Place 5 5 5 5 5   Registration 3 3 3 3 3   Attention/ Calculation 4 5 5 5 5   Recall 3 2 2 2 3   Language- name 2 objects 2 2 2 2 2   Language- repeat 1 1 1 1 1   Language- follow 3 step command 3 3 3 3 3   Language- read & follow direction 1 1 1 1 1   Write a sentence 1 1 1 1 1   Copy design 1 1 1 1 1   Total score 29 28 28 29 29         03/02/2023   11:23 AM 02/28/2022    3:11 PM  6CIT Screen  What Year? 0 points 0 points  What month? 0 points 0 points  What time? 0 points 0 points  Count back from 20 0 points 0 points  Months in reverse 0 points 0 points  Repeat phrase 0 points 0 points  Total Score 0 points 0 points    Immunizations Immunization History  Administered Date(s) Administered   Fluad Quad(high Dose 65+) 10/07/2018, 12/16/2019, 11/13/2020, 12/27/2021   Fluad Trivalent(High Dose 65+) 10/30/2022   Influenza Split 11/21/2010, 11/21/2011   Influenza Whole 02/14/2007, 10/16/2007, 10/13/2009   Influenza, High Dose Seasonal PF 12/12/2017   Influenza,inj,Quad PF,6+ Mos 11/19/2012, 10/06/2013, 11/30/2014, 11/29/2015, 10/03/2016    PFIZER(Purple Top)SARS-COV-2 Vaccination 02/28/2019, 03/26/2019, 11/04/2019   Pneumococcal Conjugate-13 05/31/2015   Pneumococcal Polysaccharide-23 01/24/2003, 01/07/2018   Td 04/16/2009   Tdap 05/13/2020   Zoster Recombinant(Shingrix ) 08/31/2021, 03/23/2022    TDAP status: Up to date  Flu Vaccine status: Up to date  Pneumococcal vaccine status: Up to date  Covid-19 vaccine status: Information provided on how to obtain vaccines.   Qualifies for Shingles Vaccine? Yes   Zostavax completed Yes   Shingrix  Completed?: Yes  Screening Tests Health Maintenance  Topic Date Due   COVID-19 Vaccine (4 - 2024-25 season) 09/24/2022   MAMMOGRAM  02/17/2023   Medicare Annual Wellness (AWV)  03/01/2024  Colonoscopy  01/05/2028   DTaP/Tdap/Td (3 - Td or Tdap) 05/14/2030   Pneumonia Vaccine 50+ Years old  Completed   INFLUENZA VACCINE  Completed   DEXA SCAN  Completed   Hepatitis C Screening  Completed   Zoster Vaccines- Shingrix   Completed   HPV VACCINES  Aged Out    Health Maintenance  Health Maintenance Due  Topic Date Due   COVID-19 Vaccine (4 - 2024-25 season) 09/24/2022   MAMMOGRAM  02/17/2023    Colorectal cancer screening: Type of screening: Colonoscopy. Completed 01/04/2018. Repeat every 10 years  Mammogram status: Completed 02/16/2021. Repeat every 2 years  Bone Density status: Completed 01/02/2019.   Lung Cancer Screening: (Low Dose CT Chest recommended if Age 41-80 years, 20 pack-year currently smoking OR have quit w/in 15years.) does not qualify.   Lung Cancer Screening Referral: no  Additional Screening:  Hepatitis C Screening: does qualify; Completed 06/12/2016  Vision Screening: Recommended annual ophthalmology exams for early detection of glaucoma and other disorders of the eye. Is the patient up to date with their annual eye exam?  Yes  Who is the provider or what is the name of the office in which the patient attends annual eye exams? [facility] If  pt is not established with a provider, would they like to be referred to a provider to establish care? No .   Dental Screening: Recommended annual dental exams for proper oral hygiene  Diabetic Foot Exam: n/a  Community Resource Referral / Chronic Care Management: CRR required this visit?  No   CCM required this visit?  No     Plan:     I have personally reviewed and noted the following in the patient's chart:   Medical and social history Use of alcohol, tobacco or illicit drugs  Current medications and supplements including opioid prescriptions. Patient is not currently taking opioid prescriptions. Functional ability and status Nutritional status Physical activity Advanced directives List of other physicians Hospitalizations, surgeries, and ER visits in previous 12 months Vitals Screenings to include cognitive, depression, and falls Referrals and appointments  In addition, I have reviewed and discussed with patient certain preventive protocols, quality metrics, and best practice recommendations. A written personalized care plan for preventive services as well as general preventive health recommendations were provided to patient.     Ardella FORBES Dawn, LPN   07/24/7972   After Visit Summary: (MyChart) Due to this being a telephonic visit, the after visit summary with patients personalized plan was offered to patient via MyChart   Nurse Notes: none

## 2023-03-21 ENCOUNTER — Encounter: Payer: Self-pay | Admitting: Internal Medicine

## 2023-03-21 ENCOUNTER — Other Ambulatory Visit: Payer: Self-pay | Admitting: Internal Medicine

## 2023-03-21 DIAGNOSIS — E039 Hypothyroidism, unspecified: Secondary | ICD-10-CM

## 2023-03-21 DIAGNOSIS — F411 Generalized anxiety disorder: Secondary | ICD-10-CM

## 2023-03-21 NOTE — Addendum Note (Signed)
 Addended by: Geralyn Flash D on: 03/21/2023 01:21 PM   Modules accepted: Orders

## 2023-03-21 NOTE — Progress Notes (Signed)
 Remote pacemaker transmission.

## 2023-03-27 DIAGNOSIS — M25562 Pain in left knee: Secondary | ICD-10-CM | POA: Diagnosis not present

## 2023-04-19 DIAGNOSIS — R002 Palpitations: Secondary | ICD-10-CM | POA: Diagnosis not present

## 2023-04-30 ENCOUNTER — Encounter: Payer: Self-pay | Admitting: Internal Medicine

## 2023-04-30 ENCOUNTER — Ambulatory Visit (INDEPENDENT_AMBULATORY_CARE_PROVIDER_SITE_OTHER): Payer: Medicare Other | Admitting: Internal Medicine

## 2023-04-30 VITALS — BP 126/76 | HR 76 | Temp 98.0°F | Ht 71.0 in | Wt 147.8 lb

## 2023-04-30 DIAGNOSIS — K5904 Chronic idiopathic constipation: Secondary | ICD-10-CM | POA: Diagnosis not present

## 2023-04-30 DIAGNOSIS — Z1231 Encounter for screening mammogram for malignant neoplasm of breast: Secondary | ICD-10-CM | POA: Insufficient documentation

## 2023-04-30 DIAGNOSIS — G901 Familial dysautonomia [Riley-Day]: Secondary | ICD-10-CM

## 2023-04-30 DIAGNOSIS — E039 Hypothyroidism, unspecified: Secondary | ICD-10-CM | POA: Diagnosis not present

## 2023-04-30 LAB — BASIC METABOLIC PANEL WITH GFR
BUN: 14 mg/dL (ref 6–23)
CO2: 28 meq/L (ref 19–32)
Calcium: 9.4 mg/dL (ref 8.4–10.5)
Chloride: 99 meq/L (ref 96–112)
Creatinine, Ser: 0.86 mg/dL (ref 0.40–1.20)
GFR: 68.31 mL/min (ref >60.00)
Glucose, Bld: 102 mg/dL — ABNORMAL HIGH (ref 70–99)
Potassium: 4.5 meq/L (ref 3.5–5.1)
Sodium: 134 meq/L — ABNORMAL LOW (ref 135–145)

## 2023-04-30 LAB — TSH: TSH: 25.01 u[IU]/mL — ABNORMAL HIGH (ref 0.35–5.50)

## 2023-04-30 MED ORDER — UNITHROID 75 MCG PO TABS: 75.0000 ug | ORAL_TABLET | Freq: Every day | ORAL | 0 refills | Status: DC

## 2023-04-30 NOTE — Progress Notes (Unsigned)
 Subjective:  Patient ID: Amy Lane, female    DOB: 23-Feb-1950  Age: 73 y.o. MRN: [id number]  CC: Hypothyroidism   HPI Amy Lane presents for f/up -----  Discussed the use of AI scribe software for clinical note transcription with the patient, who gave verbal consent to proceed.  History of Present Illness   Amy Lane is a 73 year old female with cardiac arrhythmias who presents with episodes of altered mental status and shortness of breath.   She experiences episodes characterized by sudden onset of nausea, cold sweats, weakness, and feeling very hot. During the most recent episode, which occurred a week ago on Sunday, she felt unstable and experienced a significant alteration in mental status, not recognizing her husband and asking for his name. This episode lasted approximately 45 minutes according to her husband, although she only recalls about three minutes of it. She has had three similar episodes in the past month. During these episodes, she is unable to obtain a blood pressure reading. She notes that sometimes an hour after the event, her systolic blood pressure is higher than usual, around 146-148 mmHg. She consistently experiences shortness of breath during these events but feels fine otherwise.  She has a long-standing history of cardiac arrhythmias but does not feel her heart beating irregularly and does not pay attention to it. She is scheduled to see her cardiologist next week.  She has a history of hypothyroidism, currently being treated, with a thyroid level of 0.04. She experiences fatigue, constipation, and is always feeling cold.  She was previously on Trulance for constipation but stopped it two months ago due to violent diarrhea. Recently, she has been able to have bowel movements without medication, which she finds significant.       Outpatient Medications Prior to Visit  Medication Sig Dispense Refill   acebutolol (SECTRAL) 200 MG capsule TAKE  1 CAPSULE BY MOUTH EVERYDAY AT BEDTIME 90 capsule 2   citalopram (CELEXA) 20 MG tablet TAKE 1 TABLET BY MOUTH EVERY DAY 90 tablet 0   docusate sodium (COLACE) 100 MG capsule Take 100 mg by mouth every evening.     ELIQUIS 5 MG TABS tablet TAKE 1 TABLET BY MOUTH TWICE A DAY 180 tablet 1   ezetimibe (ZETIA) 10 MG tablet TAKE 1 TABLET BY MOUTH EVERY DAY 90 tablet 3   LORazepam (ATIVAN) 1 MG tablet Take 1 tablet (1 mg total) by mouth at bedtime. for anxiety 90 tablet 0   rosuvastatin (CRESTOR) 10 MG tablet Take 1 tablet (10 mg total) by mouth daily. 90 tablet 3   UNITHROID 50 MCG tablet TAKE 1 TABLET BY MOUTH DAILY BEFORE BREAKFAST 90 tablet 0   TRULANCE 3 MG TABS TAKE 1 TABLET BY MOUTH DAILY 90 tablet 1   No facility-administered medications prior to visit.    ROS Review of Systems  Constitutional:  Positive for fatigue. Negative for appetite change, diaphoresis and unexpected weight change.  HENT: Negative.    Eyes:  Negative for visual disturbance.  Respiratory:  Negative for cough, chest tightness, shortness of breath and wheezing.   Cardiovascular:  Negative for chest pain, palpitations and leg swelling.  Gastrointestinal:  Positive for constipation. Negative for abdominal pain, blood in stool, diarrhea, nausea and vomiting.  Genitourinary: Negative.  Negative for difficulty urinating and dysuria.  Musculoskeletal: Negative.   Skin: Negative.   Neurological:  Positive for syncope. Negative for dizziness.  Hematological:  Negative for adenopathy. Does not bruise/bleed easily.  Psychiatric/Behavioral:  Negative.      Objective:  BP 126/76 (BP Location: Left Arm, Patient Position: Sitting, Cuff Size: Normal)   Pulse 76   Temp 98 F (36.7 C) (Oral)   Ht 5\' 11"  (1.803 m)   Wt 147 lb 12.8 oz (67 kg)   SpO2 98%   BMI 20.61 kg/m   BP Readings from Last 3 Encounters:  04/30/23 126/76  02/19/23 131/65  01/30/23 138/64    Wt Readings from Last 3 Encounters:  04/30/23 147 lb 12.8  oz (67 kg)  02/19/23 145 lb 3.2 oz (65.9 kg)  01/30/23 144 lb 9.6 oz (65.6 kg)    Physical Exam Vitals reviewed.  Constitutional:      Appearance: Normal appearance.  HENT:     Nose: Nose normal.     Mouth/Throat:     Mouth: Mucous membranes are moist.  Eyes:     General: No scleral icterus.    Conjunctiva/sclera: Conjunctivae normal.  Cardiovascular:     Rate and Rhythm: Normal rate and regular rhythm.     Heart sounds: No murmur heard.    No friction rub. No gallop.  Pulmonary:     Effort: Pulmonary effort is normal.     Breath sounds: No stridor. No wheezing, rhonchi or rales.  Abdominal:     General: Abdomen is flat.     Palpations: There is no mass.     Tenderness: There is no abdominal tenderness. There is no guarding.     Hernia: No hernia is present.  Musculoskeletal:        General: Normal range of motion.     Cervical back: Neck supple.     Right lower leg: No edema.     Left lower leg: No edema.  Lymphadenopathy:     Cervical: No cervical adenopathy.  Skin:    General: Skin is warm and dry.  Neurological:     General: No focal deficit present.     Mental Status: She is alert.  Psychiatric:        Mood and Affect: Mood normal.        Behavior: Behavior normal.     Lab Results  Component Value Date   WBC 7.1 01/30/2023   HGB 12.9 01/30/2023   HCT 38.8 01/30/2023   PLT 157.0 01/30/2023   GLUCOSE 102 (H) 04/30/2023   CHOL 109 01/30/2023   TRIG 67.0 01/30/2023   HDL 54.50 01/30/2023   LDLDIRECT 101.0 10/07/2018   LDLCALC 41 01/30/2023   ALT 20 01/30/2023   AST 21 01/30/2023   NA 134 (L) 04/30/2023   K 4.5 04/30/2023   CL 99 04/30/2023   CREATININE 0.86 04/30/2023   BUN 14 04/30/2023   CO2 28 04/30/2023   TSH 25.01 (H) 04/30/2023   INR 1.11 06/19/2017   HGBA1C 5.5 06/20/2017    CT CORONARY MORPH W/CTA COR W/SCORE W/CA W/CM &/OR WO/CM Addendum Date: 12/30/2022 ADDENDUM REPORT: 12/30/2022 20:46 EXAM: OVER-READ INTERPRETATION  CT CHEST The  following report is an over-read performed by radiologist Dr. Alcide Clever of [facility] on 12/30/2022. This over-read does not include interpretation of cardiac or coronary anatomy or pathology. The coronary calcium score/coronary CTA interpretation by the cardiologist is attached. COMPARISON:  04/24/2007 FINDINGS: Cardiovascular: There are no significant extracardiac vascular findings. Mediastinum/Nodes: Multiple calcified lymph nodes are identified most consistent with prior granulomatous disease. These are stable dating back to 2009. Circumferential thickening of the distal esophagus is noted likely related to reflux. No discrete mass  is seen. Lungs/Pleura: There is no pleural effusion. The visualized lungs appear clear. Upper abdomen: No significant findings in the visualized upper abdomen. Musculoskeletal/Chest wall: No chest wall mass or suspicious osseous findings within the visualized chest. IMPRESSION: Changes consistent with prior granulomatous disease. Circumferential thickening of the distal esophagus likely related to reflux. Correlate with the clinical history. Electronically Signed   By: Alcide Clever M.D.   On: 12/30/2022 20:46   Result Date: 12/30/2022 HISTORY: Chest pain/anginal equiv, high CAD risk, not treadmill candidate EXAM: Cardiac/Coronary CT TECHNIQUE: The patient was scanned on a [employer]. PROTOCOL: A 120 kV prospective scan was triggered in the descending thoracic aorta at 111 HU's. Axial non-contrast 3 mm slices were carried out through the heart. The data set was analyzed on a dedicated work station and scored using the Agatston method. Gantry rotation speed was 250 msecs and collimation was 0.6 mm. Heart rate was optimized medically and sl NTG was given. The 3D data set was reconstructed in 5% intervals of the 35-75 % of the R-R cycle. Systolic and diastolic phases were analyzed on a dedicated work station using MPR, MIP and VRT modes. The patient received  95mL OMNIPAQUE IOHEXOL 350 MG/ML SOLN of contrast. FINDINGS: Coronary calcium score: The patient's coronary artery calcium score is 0, which places the patient in the 0-39th percentile. Coronary arteries: Normal variant, LAD and LCx arise from separate ostia. Co-dominance. Right Coronary Artery: Normal caliber vessel, gives rise to small co-dominant PDA. There is a conus branch arising from separate ostium of RCC. No significant plaque or stenosis. Left Anterior Descending Coronary Artery: Arises directly from aortic root. Normal caliber vessel. No significant plaque or stenosis. Gives rise to two small diagonal branches. Left Circumflex Artery: Arises directly from aortic root. Normal caliber vessel, gives rise to small co-dominant PDA. No significant plaque or stenosis. Gives rise to two small OM branches. Aorta: Normal size, 28 mm at the mid ascending aorta (level of the PA bifurcation) measured double oblique. No aortic atherosclerosis. No dissection seen in visualized portions of the aorta. Aortic Valve: No calcifications. Trileaflet. Mitral valve: S/p repair Other findings: Normal pulmonary vein drainage into the left atrium. Normal left atrial appendage without a thrombus. Normal size of the pulmonary artery. Normal appearance of the pericardium. Pacemaker leads present. IMPRESSION: 1. No evidence of CAD, CADRADS = 0. 2. Coronary calcium score of 0. This was 0-39th percentile for age-, sex-, and race- matched controls. 3. Normal variant coronary origin with co- dominance. LAD and LCX arise from separate ostia. Conus branch arises from separate ostium. INTERPRETATION: CAD-RADS 0: No evidence of CAD (0%). Consider non-atherosclerotic causes of chest pain Electronically Signed: By: Jodelle Red M.D. On: 12/13/2022 10:21    Assessment & Plan:   Acquired hypothyroidism- Her TSH is 25. Will increase her T4 dose. -     TSH; Future -     Unithroid; Take 1 tablet (75 mcg total) by mouth daily before  breakfast.  Dispense: 90 tablet; Refill: 0  Screening mammogram for breast cancer -     3D Screening Mammogram, Left and Right; Future  Chronic idiopathic constipation -     Basic metabolic panel with GFR; Future  Dysautonomia (HCC) -     Basic metabolic panel with GFR; Future     Follow-up: Return in about 6 months (around 10/30/2023).  Sanda Linger, MD

## 2023-04-30 NOTE — Patient Instructions (Signed)

## 2023-05-01 ENCOUNTER — Ambulatory Visit
Admission: RE | Admit: 2023-05-01 | Discharge: 2023-05-01 | Disposition: A | Discharge status: Home / Self Care | Source: Ambulatory Visit | Attending: Internal Medicine | Admitting: Internal Medicine

## 2023-05-01 DIAGNOSIS — Z1231 Encounter for screening mammogram for malignant neoplasm of breast: Secondary | ICD-10-CM | POA: Diagnosis not present

## 2023-05-01 NOTE — Progress Notes (Signed)
 Patient ID: Amy Lane, female   DOB: March 28, 1950, 73 y.o.   MRN: [id number]     73 y.o. female history of bradycardia post pacer  Sees Dr Rodolfo Clan  status post generator replacement and lead repair March 2011. PAF quiescent She had history of POTS/Long QT and dysautonomia associated with Ehlers-Danlos syndrome. Post left CEA patent by duplex May 2019 MV repair at [facility] most recent echo 11/24/19 trivial MR no MS normal EF   Cognitive defect previously on aricept  ? Of MS with LE weakness Unable to do MRI due to pacer Seen by Dr Tilda Fogo Left visual field cut but  Carotids normal.   May in hospital with stroke/migraine like symptoms Confused and lost speech for 10 hours CT negative Rx migraine meds and to f/u with neurology Tilda Fogo Now on celexa  Husband battling prostate cancer Finishing 80 Rx of hyperbaric oxygen to help heal Laser injury to colon/prostate   PPM normal function per EP with no PAF on device interrogation   Has been intolerant to statins with myalgias. LDL 80  05/13/20 on Zetia  Did not want to pursue PSK9 Cardiac CTA 12/13/22 normal calcium score 0   Thyroid has had elevated TSH 04/30/23 25 had been suppressed 3 months earlier  Some spells of lightheadedness Seen by Dr Rodolfo Clan Monitor 12/19/21 SR no PAF some nonsustained SVT and rare PVCls  No change in Rx by Dr Rodolfo Clan. PACEART 02/13/22 normal battery status, lead impedence and histograms Seen by Dr Rodolfo Clan 12/27/22 ? Neurally mediated encouraged salt intake. Post "ictal" BP elevated. She experiences episodes characterized by sudden onset of nausea, cold sweats, weakness, and feeling very hot. During the most recent episode, which occurred a week ago on Sunday, she felt unstable and experienced a significant alteration in mental status, not recognizing her husband and asking for his name. This episode lasted approximately 45 minutes according to her husband, although she only recalls about three minutes of it.   She saw Dr  Salli Crawley with neuro and did not get along with him at all He seemed to have very little interest in understanding her chronic/acute neuro cognitive dysfunciton  ROS: Denies fever, malais, weight loss, blurry vision, decreased visual acuity, cough, sputum, SOB, hemoptysis, pleuritic pain, palpitaitons, heartburn, abdominal pain, melena, lower extremity edema, claudication, or rash.  All other systems reviewed and negative  General: BP 120/62   Pulse 72   Ht 5\' 11"  (1.803 m)   Wt 148 lb 6.4 oz (67.3 kg)   SpO2 98%   BMI 20.70 kg/m   Affect appropriate Healthy:  appears stated age HEENT: normal Neck supple with no adenopathy Post CEA left no thyromegaly Lungs clear with no wheezing and good diaphragmatic motion Heart:  S1/S2 no murmur, no rub, gallop or click PMI normal post right mini thoracotomy PPM under left clavicle  Abdomen: benighn, BS positve, no tenderness, no AAA no bruit.  No HSM or HJR Distal pulses intact with no bruits No edema Neuro non-focal Skin warm and dry No muscular weakness   Current Outpatient Medications  Medication Sig Dispense Refill   acebutolol (SECTRAL) 200 MG capsule TAKE 1 CAPSULE BY MOUTH EVERYDAY AT BEDTIME 90 capsule 2   citalopram (CELEXA) 20 MG tablet TAKE 1 TABLET BY MOUTH EVERY DAY 90 tablet 0   docusate sodium (COLACE) 100 MG capsule Take 100 mg by mouth every evening.     ELIQUIS 5 MG TABS tablet TAKE 1 TABLET BY MOUTH TWICE A DAY 180 tablet 1  ezetimibe (ZETIA) 10 MG tablet TAKE 1 TABLET BY MOUTH EVERY DAY 90 tablet 3   LORazepam (ATIVAN) 1 MG tablet Take 1 tablet (1 mg total) by mouth at bedtime. for anxiety 90 tablet 0   UNITHROID 75 MCG tablet Take 1 tablet (75 mcg total) by mouth daily before breakfast. 90 tablet 0   rosuvastatin (CRESTOR) 10 MG tablet Take 1 tablet (10 mg total) by mouth daily. 90 tablet 3   No current facility-administered medications for this visit.    Allergies  Dapagliflozin-saxagliptin, Statins,  Lovastatin, Adhesive [tape], and Latex  Electrocardiogram:  06/20/17  A pacing RBBB 03/12/19 SR rate 72 RBBB PAC   Assessment and Plan  Pacer:  Reviewed Dr Antone Batten last note normal function normal remote transmission  ? When time Can it be changed out and be MRI compatible Normal PaCEART 02/14/23 reviewed   POTS:  Seems more active see note Dr Rodolfo Clan encouraged salt intake   TIA:  CEA patent by duplex 05/30/17 will update   Now on Eliquis with no bleeding issues ? Related to PAF Device with 0% PAF   MVrepair:  TTE 11/24/19 EF 55-60% trivial MR stable repair SBE prophylaxis   Thyroid: adjust synthroid dose per primary. Seems fickle with suppressed TSH 3 mnths ago and now TSH 25   Chol:  On Zetia LDL 100 intolerant to statins  Calcium score 0   Neuro:  Continue celexa for cognitive dysfunction F//U with neuro. She does not like [facility] now that Dr Tilda Fogo is retired. Encouraged her to see a neuro cognitive specialist in [place]. Also discussed having EEG applied when she has a prolonged episode and goes to ER with symptoms Topamax d/c  Ortho:  Patellar degeneration post cortisone injection f/u [facility] would not encourage surgery unless Pain gets worse   SVT: nonsustained episodes f/u EP no longer on Sectral   Carotid duplex post CEA   F/U with Rodolfo Clan in 6 months and me in a year  F/U Dr Rochelle Chu for thyroid F/U Dr Tilda Fogo neuro ? Complex migraines   Amy Lane

## 2023-05-07 ENCOUNTER — Encounter: Payer: Self-pay | Admitting: Internal Medicine

## 2023-05-07 ENCOUNTER — Other Ambulatory Visit: Payer: Self-pay | Admitting: Internal Medicine

## 2023-05-07 DIAGNOSIS — R928 Other abnormal and inconclusive findings on diagnostic imaging of breast: Secondary | ICD-10-CM

## 2023-05-08 ENCOUNTER — Ambulatory Visit
Admission: RE | Admit: 2023-05-08 | Discharge: 2023-05-08 | Disposition: A | Discharge status: Home / Self Care | Source: Ambulatory Visit | Attending: Internal Medicine | Admitting: Internal Medicine

## 2023-05-08 ENCOUNTER — Ambulatory Visit

## 2023-05-08 DIAGNOSIS — R928 Other abnormal and inconclusive findings on diagnostic imaging of breast: Secondary | ICD-10-CM

## 2023-05-09 ENCOUNTER — Ambulatory Visit: Payer: Medicare Other | Attending: Cardiovascular Disease | Admitting: Cardiovascular Disease

## 2023-05-09 ENCOUNTER — Encounter: Payer: Self-pay | Admitting: Cardiovascular Disease

## 2023-05-09 VITALS — BP 120/62 | HR 72 | Ht 71.0 in | Wt 148.4 lb

## 2023-05-09 DIAGNOSIS — R55 Syncope and collapse: Secondary | ICD-10-CM | POA: Diagnosis not present

## 2023-05-09 DIAGNOSIS — Z9889 Other specified postprocedural states: Secondary | ICD-10-CM | POA: Diagnosis not present

## 2023-05-09 DIAGNOSIS — G90A Postural orthostatic tachycardia syndrome (POTS): Secondary | ICD-10-CM

## 2023-05-09 NOTE — Patient Instructions (Addendum)
 Medication Instructions:  Your physician recommends that you continue on your current medications as directed. Please refer to the Current Medication list given to you today.  *If you need a refill on your cardiac medications before your next appointment, please call your pharmacy*  Lab Work: If you have labs (blood work) drawn today and your tests are completely normal, you will receive your results only by: MyChart Message (if you have MyChart) OR A paper copy in the mail If you have any lab test that is abnormal or we need to change your treatment, we will call you to review the results.  Testing/Procedures: Your physician has requested that you have a carotid duplex. This test is an ultrasound of the carotid arteries in your neck. It looks at blood flow through these arteries that supply the brain with blood. Allow one hour for this exam. There are no restrictions or special instructions.   Follow-Up: At [facility], you and your health needs are our priority.  As part of our continuing mission to provide you with exceptional heart care, our providers are all part of one team.  This team includes your primary Cardiologist (physician) and Advanced Practice Providers or APPs (Physician Assistants and Nurse Practitioners) who all work together to provide you with the care you need, when you need it.  Your next appointment:   6 months  Provider:   Janelle Mediate, MD     We recommend signing up for the patient portal called "MyChart".  Sign up information is provided on this After Visit Summary.  MyChart is used to connect with patients for Virtual Visits (Telemedicine).  Patients are able to view lab/test results, encounter notes, upcoming appointments, etc.  Non-urgent messages can be sent to your provider as well.   To learn more about what you can do with MyChart, go to [email or web address].   Other Instructions       1st Floor: - Lobby - Registration  - Pharmacy  -  Lab - Cafe  2nd Floor: - PV Lab - Diagnostic Testing (echo, CT, nuclear med)  3rd Floor: - Vacant  4th Floor: - TCTS (cardiothoracic surgery) - AFib Clinic - Structural Heart Clinic - Vascular Surgery  - Vascular Ultrasound  5th Floor: - [facility] (general and EP) - Clinical Pharmacy for coumadin, hypertension, lipid, weight-loss medications, and med management appointments    Valet parking services will be available as well.

## 2023-05-10 DIAGNOSIS — M222X2 Patellofemoral disorders, left knee: Secondary | ICD-10-CM | POA: Diagnosis not present

## 2023-05-14 ENCOUNTER — Ambulatory Visit: Payer: Medicare Other

## 2023-05-14 DIAGNOSIS — I495 Sick sinus syndrome: Secondary | ICD-10-CM | POA: Diagnosis not present

## 2023-05-15 ENCOUNTER — Encounter

## 2023-05-15 ENCOUNTER — Other Ambulatory Visit

## 2023-05-15 LAB — CUP PACEART REMOTE DEVICE CHECK
Battery Impedance: 3871 Ohm
Battery Remaining Longevity: 15 mo
Battery Voltage: 2.69 V
Brady Statistic AP VP Percent: 0 %
Brady Statistic AP VS Percent: 83 %
Brady Statistic AS VP Percent: 0 %
Brady Statistic AS VS Percent: 17 %
Date Time Interrogation Session: 20250421112819
Implantable Pulse Generator Implant Date: 20110310
Lead Channel Impedance Value: 415 Ohm
Lead Channel Impedance Value: 614 Ohm
Lead Channel Pacing Threshold Amplitude: 0.75 V
Lead Channel Pacing Threshold Amplitude: 1 V
Lead Channel Pacing Threshold Pulse Width: 0.4 ms
Lead Channel Pacing Threshold Pulse Width: 0.4 ms
Lead Channel Setting Pacing Amplitude: 2 V
Lead Channel Setting Pacing Amplitude: 2.5 V
Lead Channel Setting Pacing Pulse Width: 0.4 ms
Lead Channel Setting Sensing Sensitivity: 2.8 mV
Zone Setting Status: 755011
Zone Setting Status: 755011

## 2023-05-16 DIAGNOSIS — M222X2 Patellofemoral disorders, left knee: Secondary | ICD-10-CM | POA: Diagnosis not present

## 2023-05-22 ENCOUNTER — Telehealth: Payer: Self-pay | Admitting: Cardiovascular Disease

## 2023-05-22 NOTE — Telephone Encounter (Signed)
 Called patient back about her message. Patient stated she wanted to let Dr. Stann Earnest know what was going on with having these more frequent episodes of confusion, weakness, nausea and chills. Patient stated she has an appointment with Neuro in September. Patient is getting her carotids check on 05/31/23.  Informed patient if she has another episode and she is having stroke like symptoms to call 911. Will forward to Dr. Nishan for further advisement.

## 2023-05-22 NOTE — Telephone Encounter (Signed)
 STAT if patient feels like he/she is going to faint   1. Are you feeling dizzy, lightheaded, or faint right now? No     2. Have you passed out?  No  (If yes move to .SYNCOPECHMG)   3. Do you have any other symptoms? Nausea, chills, weakness, and confusion    4. Have you checked your HR and BP (record if available)?  05/22/23 1 hr after episode 135/70 HR 65   Patient reports she had one episode today that lasted about 10 mins, and two episodes yesterday, 3 hrs apart that were also about 10 mins a piece. Due to this her and her husband have decided she will not be driving. The 2nd episode yesterday caused her to be so fatigued and exhausted she slept for 3 hrs after. She states when these episodes occur her BP is normally so low it will not pick up readings.   She reports she found a neurologist with [facility], Dr. Donnel Gail. Her appointment is not until 09/30. Please advise.

## 2023-05-31 ENCOUNTER — Ambulatory Visit (HOSPITAL_COMMUNITY)
Admission: RE | Admit: 2023-05-31 | Discharge: 2023-05-31 | Disposition: A | Discharge status: Home / Self Care | Source: Ambulatory Visit | Attending: Cardiovascular Disease | Admitting: Cardiovascular Disease

## 2023-05-31 DIAGNOSIS — Z9889 Other specified postprocedural states: Secondary | ICD-10-CM | POA: Diagnosis not present

## 2023-06-07 ENCOUNTER — Ambulatory Visit: Payer: Self-pay | Admitting: Cardiovascular Disease

## 2023-06-14 NOTE — Telephone Encounter (Signed)
 Pt returning call to a nurse

## 2023-06-18 ENCOUNTER — Other Ambulatory Visit: Payer: Self-pay | Admitting: Internal Medicine

## 2023-06-18 ENCOUNTER — Other Ambulatory Visit: Payer: Self-pay | Admitting: Cardiovascular Disease

## 2023-06-18 DIAGNOSIS — F411 Generalized anxiety disorder: Secondary | ICD-10-CM

## 2023-06-21 ENCOUNTER — Other Ambulatory Visit: Payer: Self-pay

## 2023-06-21 MED ORDER — EZETIMIBE 10 MG PO TABS: ORAL_TABLET | ORAL | 3 refills | Status: AC

## 2023-06-27 ENCOUNTER — Other Ambulatory Visit: Payer: Self-pay | Admitting: Internal Medicine

## 2023-06-27 DIAGNOSIS — E039 Hypothyroidism, unspecified: Secondary | ICD-10-CM

## 2023-06-29 NOTE — Addendum Note (Signed)
 Addended by: Edra Govern D on: 06/29/2023 10:20 AM   Modules accepted: Orders

## 2023-06-29 NOTE — Progress Notes (Signed)
 Remote pacemaker transmission.

## 2023-07-09 DIAGNOSIS — H2513 Age-related nuclear cataract, bilateral: Secondary | ICD-10-CM | POA: Diagnosis not present

## 2023-07-09 DIAGNOSIS — H16223 Keratoconjunctivitis sicca, not specified as Sjogren's, bilateral: Secondary | ICD-10-CM | POA: Diagnosis not present

## 2023-07-13 ENCOUNTER — Telehealth: Payer: Self-pay | Admitting: Internal Medicine

## 2023-07-13 NOTE — Telephone Encounter (Signed)
*  STAT* If patient is at the pharmacy, call can be transferred to refill team.   1. Which medications need to be refilled? (please list name of each medication and dose if known) ELIQUIS  5 MG TABS tablet    2. Would you like to learn more about the convenience, safety, & potential cost savings by using the [facility]?    3. Are you open to using the [facility] (Type [facility].  ).   4. Which pharmacy/location (including street and city if local pharmacy) is medication to be sent to?  [id number] - [place], [place] - [address] AT [address] OF [address]     5. Do they need a 30 day or 90 day supply? 90 day

## 2023-07-16 ENCOUNTER — Other Ambulatory Visit: Payer: Self-pay

## 2023-07-16 DIAGNOSIS — I480 Paroxysmal atrial fibrillation: Secondary | ICD-10-CM

## 2023-07-16 MED ORDER — APIXABAN 5 MG PO TABS: 5.0000 mg | ORAL_TABLET | Freq: Two times a day (BID) | ORAL | 1 refills | Status: DC

## 2023-07-16 NOTE — Telephone Encounter (Signed)
 Prescription refill request for Eliquis  received. Indication:cea Last office visit:4/25 Scr:0.86  4/25 Age: 73 Weight:67.3  kg  Prescription refilled

## 2023-07-30 ENCOUNTER — Other Ambulatory Visit: Payer: Self-pay | Admitting: Internal Medicine

## 2023-07-30 ENCOUNTER — Other Ambulatory Visit (INDEPENDENT_AMBULATORY_CARE_PROVIDER_SITE_OTHER)

## 2023-07-30 DIAGNOSIS — E039 Hypothyroidism, unspecified: Secondary | ICD-10-CM

## 2023-07-30 DIAGNOSIS — G90A Postural orthostatic tachycardia syndrome (POTS): Secondary | ICD-10-CM | POA: Diagnosis not present

## 2023-07-30 LAB — BASIC METABOLIC PANEL WITH GFR
BUN: 12 mg/dL (ref 6–23)
CO2: 28 meq/L (ref 19–32)
Calcium: 9.2 mg/dL (ref 8.4–10.5)
Chloride: 101 meq/L (ref 96–112)
Creatinine, Ser: 0.78 mg/dL (ref 0.40–1.20)
GFR: 76.67 mL/min (ref >60.00)
Glucose, Bld: 100 mg/dL — ABNORMAL HIGH (ref 70–99)
Potassium: 4 meq/L (ref 3.5–5.1)
Sodium: 135 meq/L (ref 135–145)

## 2023-07-30 LAB — CBC WITH DIFFERENTIAL/PLATELET
Basophils Absolute: 0 K/uL (ref 0.0–0.1)
Basophils Relative: 0.4 % (ref 0.0–3.0)
Eosinophils Absolute: 0.1 K/uL (ref 0.0–0.7)
Eosinophils Relative: 2 % (ref 0.0–5.0)
HCT: 36.7 % (ref 36.0–46.0)
Hemoglobin: 12.6 g/dL (ref 12.0–15.0)
Lymphocytes Relative: 30.8 % (ref 12.0–46.0)
Lymphs Abs: 1.4 K/uL (ref 0.7–4.0)
MCHC: 34.3 g/dL (ref 30.0–36.0)
MCV: 90.6 fl (ref 78.0–100.0)
Monocytes Absolute: 0.6 K/uL (ref 0.1–1.0)
Monocytes Relative: 12.9 % — ABNORMAL HIGH (ref 3.0–12.0)
Neutro Abs: 2.4 K/uL (ref 1.4–7.7)
Neutrophils Relative %: 53.9 % (ref 43.0–77.0)
Platelets: 158 K/uL (ref 150.0–400.0)
RBC: 4.05 Mil/uL (ref 3.87–5.11)
RDW: 12.8 % (ref 11.5–15.5)
WBC: 4.4 K/uL (ref 4.0–10.5)

## 2023-07-30 LAB — TSH: TSH: 0.05 u[IU]/mL — ABNORMAL LOW (ref 0.35–5.50)

## 2023-07-30 NOTE — Progress Notes (Unsigned)
 Lab Results  Component Value Date   WBC 7.1 01/30/2023   HGB 12.9 01/30/2023   HCT 38.8 01/30/2023   PLT 157.0 01/30/2023   GLUCOSE 102 (H) 04/30/2023   CHOL 109 01/30/2023   TRIG 67.0 01/30/2023   HDL 54.50 01/30/2023   LDLDIRECT 101.0 10/07/2018   LDLCALC 41 01/30/2023   ALT 20 01/30/2023   AST 21 01/30/2023   NA 134 (L) 04/30/2023   K 4.5 04/30/2023   CL 99 04/30/2023   CREATININE 0.86 04/30/2023   BUN 14 04/30/2023   CO2 28 04/30/2023   TSH 25.01 (H) 04/30/2023   INR 1.11 06/19/2017   HGBA1C 5.5 06/20/2017

## 2023-07-31 ENCOUNTER — Ambulatory Visit: Payer: Self-pay | Admitting: Internal Medicine

## 2023-07-31 ENCOUNTER — Other Ambulatory Visit: Payer: Self-pay | Admitting: Internal Medicine

## 2023-07-31 DIAGNOSIS — E039 Hypothyroidism, unspecified: Secondary | ICD-10-CM

## 2023-07-31 MED ORDER — UNITHROID 50 MCG PO TABS: 50.0000 ug | ORAL_TABLET | ORAL | 0 refills | Status: DC

## 2023-07-31 MED ORDER — UNITHROID 75 MCG PO TABS: 75.0000 ug | ORAL_TABLET | ORAL | 0 refills | Status: DC

## 2023-08-07 DIAGNOSIS — L659 Nonscarring hair loss, unspecified: Secondary | ICD-10-CM | POA: Diagnosis not present

## 2023-08-07 DIAGNOSIS — K5900 Constipation, unspecified: Secondary | ICD-10-CM | POA: Diagnosis not present

## 2023-08-07 DIAGNOSIS — R6889 Other general symptoms and signs: Secondary | ICD-10-CM | POA: Diagnosis not present

## 2023-08-07 DIAGNOSIS — R635 Abnormal weight gain: Secondary | ICD-10-CM | POA: Diagnosis not present

## 2023-08-07 DIAGNOSIS — L853 Xerosis cutis: Secondary | ICD-10-CM | POA: Diagnosis not present

## 2023-08-07 DIAGNOSIS — E039 Hypothyroidism, unspecified: Secondary | ICD-10-CM | POA: Diagnosis not present

## 2023-08-13 ENCOUNTER — Ambulatory Visit: Payer: Medicare Other

## 2023-08-13 DIAGNOSIS — I495 Sick sinus syndrome: Secondary | ICD-10-CM | POA: Diagnosis not present

## 2023-08-15 LAB — CUP PACEART REMOTE DEVICE CHECK
Battery Impedance: 4436 Ohm
Battery Remaining Longevity: 11 mo
Battery Voltage: 2.67 V
Brady Statistic AP VP Percent: 0 %
Brady Statistic AP VS Percent: 81 %
Brady Statistic AS VP Percent: 0 %
Brady Statistic AS VS Percent: 18 %
Date Time Interrogation Session: 20250722111810
Implantable Pulse Generator Implant Date: 20110310
Lead Channel Impedance Value: 413 Ohm
Lead Channel Impedance Value: 637 Ohm
Lead Channel Pacing Threshold Amplitude: 1 V
Lead Channel Pacing Threshold Amplitude: 1.125 V
Lead Channel Pacing Threshold Pulse Width: 0.4 ms
Lead Channel Pacing Threshold Pulse Width: 0.4 ms
Lead Channel Setting Pacing Amplitude: 2 V
Lead Channel Setting Pacing Amplitude: 2.5 V
Lead Channel Setting Pacing Pulse Width: 0.4 ms
Lead Channel Setting Sensing Sensitivity: 2.8 mV
Zone Setting Status: 755011
Zone Setting Status: 755011

## 2023-08-16 ENCOUNTER — Ambulatory Visit: Payer: Self-pay | Admitting: Cardiology

## 2023-09-11 ENCOUNTER — Other Ambulatory Visit: Payer: Self-pay | Admitting: Nurse Practitioner

## 2023-09-11 ENCOUNTER — Other Ambulatory Visit: Payer: Self-pay | Admitting: Internal Medicine

## 2023-09-11 DIAGNOSIS — F411 Generalized anxiety disorder: Secondary | ICD-10-CM

## 2023-09-27 ENCOUNTER — Telehealth: Payer: Self-pay | Admitting: Cardiovascular Disease

## 2023-09-27 DIAGNOSIS — L659 Nonscarring hair loss, unspecified: Secondary | ICD-10-CM | POA: Diagnosis not present

## 2023-09-27 DIAGNOSIS — E039 Hypothyroidism, unspecified: Secondary | ICD-10-CM | POA: Diagnosis not present

## 2023-09-27 DIAGNOSIS — R6889 Other general symptoms and signs: Secondary | ICD-10-CM | POA: Diagnosis not present

## 2023-09-27 DIAGNOSIS — L853 Xerosis cutis: Secondary | ICD-10-CM | POA: Diagnosis not present

## 2023-09-27 NOTE — Telephone Encounter (Signed)
 Patient stated she wants a call back to discuss her pacemaker and did not want to provide further details.

## 2023-09-27 NOTE — Telephone Encounter (Signed)
 Returned call to Pt.  She would like to have Dr. Nishan make a recommendation for who she should follow with now that Dr. Fernande has retired.  Advised would ask and call Pt back.

## 2023-09-28 NOTE — Telephone Encounter (Signed)
Left message on VM requesting call back

## 2023-09-28 NOTE — Telephone Encounter (Signed)
 Call back received from Pt.  Advised Pt that Dr. Delford recommends Dr. Almetta.  Pt in agreement.  Follow up appointment made.

## 2023-10-02 LAB — TSH: TSH: 6.4 — AB (ref 0.41–5.90)

## 2023-10-15 NOTE — Progress Notes (Signed)
 SABRA

## 2023-10-23 DIAGNOSIS — Z7901 Long term (current) use of anticoagulants: Secondary | ICD-10-CM | POA: Diagnosis not present

## 2023-10-23 DIAGNOSIS — I6930 Unspecified sequelae of cerebral infarction: Secondary | ICD-10-CM | POA: Insufficient documentation

## 2023-10-23 DIAGNOSIS — I4891 Unspecified atrial fibrillation: Secondary | ICD-10-CM | POA: Diagnosis not present

## 2023-10-23 DIAGNOSIS — E785 Hyperlipidemia, unspecified: Secondary | ICD-10-CM | POA: Diagnosis not present

## 2023-10-23 DIAGNOSIS — G459 Transient cerebral ischemic attack, unspecified: Secondary | ICD-10-CM | POA: Insufficient documentation

## 2023-10-23 DIAGNOSIS — R2990 Unspecified symptoms and signs involving the nervous system: Secondary | ICD-10-CM | POA: Diagnosis not present

## 2023-10-23 DIAGNOSIS — Z1331 Encounter for screening for depression: Secondary | ICD-10-CM | POA: Diagnosis not present

## 2023-10-29 NOTE — Progress Notes (Signed)
 Remote PPM Transmission

## 2023-10-30 ENCOUNTER — Encounter: Payer: Self-pay | Admitting: Internal Medicine

## 2023-10-30 ENCOUNTER — Ambulatory Visit: Admitting: Internal Medicine

## 2023-10-30 ENCOUNTER — Ambulatory Visit: Payer: Self-pay | Admitting: Internal Medicine

## 2023-10-30 VITALS — BP 114/64 | HR 64 | Temp 97.6°F | Resp 16 | Ht 71.0 in | Wt 146.2 lb

## 2023-10-30 DIAGNOSIS — R739 Hyperglycemia, unspecified: Secondary | ICD-10-CM | POA: Diagnosis not present

## 2023-10-30 DIAGNOSIS — E039 Hypothyroidism, unspecified: Secondary | ICD-10-CM

## 2023-10-30 DIAGNOSIS — R001 Bradycardia, unspecified: Secondary | ICD-10-CM | POA: Diagnosis not present

## 2023-10-30 DIAGNOSIS — M79643 Pain in unspecified hand: Secondary | ICD-10-CM | POA: Insufficient documentation

## 2023-10-30 DIAGNOSIS — M19049 Primary osteoarthritis, unspecified hand: Secondary | ICD-10-CM | POA: Insufficient documentation

## 2023-10-30 DIAGNOSIS — M2550 Pain in unspecified joint: Secondary | ICD-10-CM | POA: Insufficient documentation

## 2023-10-30 DIAGNOSIS — Z23 Encounter for immunization: Secondary | ICD-10-CM

## 2023-10-30 LAB — BASIC METABOLIC PANEL WITH GFR
BUN: 10 mg/dL (ref 6–23)
CO2: 29 meq/L (ref 19–32)
Calcium: 9.4 mg/dL (ref 8.4–10.5)
Chloride: 101 meq/L (ref 96–112)
Creatinine, Ser: 0.7 mg/dL (ref 0.40–1.20)
GFR: 87.15 mL/min (ref >60.00)
Glucose, Bld: 100 mg/dL — ABNORMAL HIGH (ref 70–99)
Potassium: 4.9 meq/L (ref 3.5–5.1)
Sodium: 135 meq/L (ref 135–145)

## 2023-10-30 LAB — HEMOGLOBIN A1C: Hgb A1c MFr Bld: 6.3 % (ref 4.6–6.5)

## 2023-10-30 MED ORDER — COVID-19 MRNA VAC-TRIS(PFIZER) 30 MCG/0.3ML IM SUSY: 0.3000 mL | PREFILLED_SYRINGE | Freq: Once | INTRAMUSCULAR | 0 refills | Status: AC

## 2023-10-30 NOTE — Progress Notes (Signed)
 Subjective:  Patient ID: Amy Lane, female    DOB: [birth year]  Age: 73 y.o. MRN: [id number]  CC: Hypothyroidism   HPI Amy Lane presents for f/up ---  Discussed the use of AI scribe software for clinical note transcription with the patient, who gave verbal consent to proceed.  History of Present Illness Amy Lane is a 73 year old female who presents for follow-up regarding her seizure management and thyroid  function.  She has been experiencing episodes identified as brain seizures over the last two years and is currently under the care of a neurologist at [facility]. She is on Keppra to manage these episodes and has adjusted to the medication without experiencing dizziness or lightheadedness since the weekend.  She has a history of low blood pressure over the past twenty years, with recent episodes of dizziness and lightheadedness over the weekend. Her heart rate was noted to be low at 46 during a recent visit to [facility]. No current changes in her heart rate are reported.  She is also under the care of an endocrinologist for her thyroid  condition. Her TSH level was 6.4 a month ago, leading to a change in her medication dosage. She is scheduled for another TSH check next week and reports feeling better since the dosage adjustment.  No current symptoms of weakness, dizziness, or lightheadedness. No changes in heart rate.     Outpatient Medications Prior to Visit  Medication Sig Dispense Refill   acebutolol  (SECTRAL ) 200 MG capsule TAKE 1 CAPSULE BY MOUTH EVERYDAY AT BEDTIME 90 capsule 3   apixaban  (ELIQUIS ) 5 MG TABS tablet Take 1 tablet (5 mg total) by mouth 2 (two) times daily. 180 tablet 1   citalopram  (CELEXA ) 20 MG tablet TAKE 1 TABLET BY MOUTH EVERY DAY 90 tablet 0   docusate sodium  (COLACE) 100 MG capsule Take 100 mg by mouth every evening.     ezetimibe  (ZETIA ) 10 MG tablet TAKE 1 TABLET BY MOUTH EVERY DAY 90 tablet 3   levETIRAcetam (KEPPRA) 750 MG tablet Take  750 mg by mouth every 12 (twelve) hours.     levothyroxine  (SYNTHROID ) 50 MCG tablet Take 50 mcg by mouth daily. Take 1 tablet 6 days a week and on day 7 take 2 tablets     LORazepam  (ATIVAN ) 1 MG tablet TAKE 1 TABLET BY MOUTH AT BEDTIME FOR ANXIETY 90 tablet 0   rosuvastatin  (CRESTOR ) 10 MG tablet TAKE 1 TABLET BY MOUTH EVERY DAY 90 tablet 2   UNITHROID  50 MCG tablet Take 1 tablet (50 mcg total) by mouth every other day. 45 tablet 0   UNITHROID  75 MCG tablet Take 1 tablet (75 mcg total) by mouth every other day. 45 tablet 0   No facility-administered medications prior to visit.    ROS Review of Systems  Constitutional:  Negative for appetite change, chills, diaphoresis, fatigue and fever.  HENT: Negative.    Eyes: Negative.   Respiratory:  Negative for cough, chest tightness, shortness of breath and wheezing.   Cardiovascular:  Negative for chest pain, palpitations and leg swelling.  Gastrointestinal: Negative.  Negative for abdominal pain, constipation, diarrhea, nausea and vomiting.  Endocrine: Negative.   Genitourinary:  Negative for difficulty urinating.  Musculoskeletal: Negative.  Negative for arthralgias and myalgias.  Skin: Negative.   Neurological:  Positive for dizziness and light-headedness. Negative for seizures, speech difficulty, weakness, numbness and headaches.  Hematological:  Negative for adenopathy. Does not bruise/bleed easily.  Psychiatric/Behavioral: Negative.  Objective:  BP 114/64 (BP Location: Left Arm, Patient Position: Sitting, Cuff Size: Normal)   Pulse 64   Temp 97.6 F (36.4 C) (Oral)   Resp 16   Ht 5' 11 (1.803 m)   Wt 146 lb 3.2 oz (66.3 kg)   SpO2 96%   BMI 20.39 kg/m   BP Readings from Last 3 Encounters:  10/30/23 114/64  05/09/23 120/62  04/30/23 126/76    Wt Readings from Last 3 Encounters:  10/30/23 146 lb 3.2 oz (66.3 kg)  05/09/23 148 lb 6.4 oz (67.3 kg)  04/30/23 147 lb 12.8 oz (67 kg)    Physical Exam Vitals reviewed.   Constitutional:      Appearance: Normal appearance.  HENT:     Mouth/Throat:     Mouth: Mucous membranes are moist.  Eyes:     General: No scleral icterus.    Conjunctiva/sclera: Conjunctivae normal.  Cardiovascular:     Rate and Rhythm: Bradycardia present. Occasional Extrasystoles are present.    Heart sounds: S1 normal and S2 normal. Heart sounds are distant. No murmur heard.    No friction rub. No gallop.     Comments: EKG-- SB (about 50 bpm) with PAC's RBBB LAFB Unchanged  Pulmonary:     Effort: Pulmonary effort is normal.     Breath sounds: No stridor. No wheezing, rhonchi or rales.  Abdominal:     General: Abdomen is flat.     Palpations: There is no mass.     Tenderness: There is no abdominal tenderness. There is no guarding.     Hernia: No hernia is present.  Musculoskeletal:        General: Normal range of motion.     Cervical back: Neck supple.     Right lower leg: No edema.     Left lower leg: No edema.  Lymphadenopathy:     Cervical: No cervical adenopathy.  Skin:    General: Skin is warm and dry.  Neurological:     General: No focal deficit present.  Psychiatric:        Mood and Affect: Mood normal.     Lab Results  Component Value Date   WBC 4.4 07/30/2023   HGB 12.6 07/30/2023   HCT 36.7 07/30/2023   PLT 158.0 07/30/2023   GLUCOSE 100 (H) 10/30/2023   CHOL 109 01/30/2023   TRIG 67.0 01/30/2023   HDL 54.50 01/30/2023   LDLDIRECT 101.0 10/07/2018   LDLCALC 41 01/30/2023   ALT 20 01/30/2023   AST 21 01/30/2023   NA 135 10/30/2023   K 4.9 10/30/2023   CL 101 10/30/2023   CREATININE 0.70 10/30/2023   BUN 10 10/30/2023   CO2 29 10/30/2023   TSH 6.40 (A) 10/02/2023   INR 1.11 06/19/2017   HGBA1C 6.3 10/30/2023    VAS US  CAROTID Result Date: 06/03/2023 Carotid Arterial Duplex Study Patient Name:  Amy Lane  Date of Exam:   05/31/2023 Medical Rec #: [id number]          Accession #:    [id number] Date of Birth: October 27, 1950           Patient Gender: F Patient Age:   63 years Exam Location:  [address] Procedure:      VAS US  CAROTID Referring Phys: MAUDE EMMER --------------------------------------------------------------------------------  Indications:  Patient has been having frequent episodes of confusion, weakness,               nausea and chills. Risk Factors: Hyperlipidemia, no history  of smoking. Performing Technologist: Edsel Mustard RVT  Examination Guidelines: A complete evaluation includes B-mode imaging, spectral Doppler, color Doppler, and power Doppler as needed of all accessible portions of each vessel. Bilateral testing is considered an integral part of a complete examination. Limited examinations for reoccurring indications may be performed as noted.  Right Carotid Findings: +----------+--------+--------+--------+------------------+--------+           PSV cm/sEDV cm/sStenosisPlaque DescriptionComments +----------+--------+--------+--------+------------------+--------+ CCA Prox  68      17                                         +----------+--------+--------+--------+------------------+--------+ CCA Distal48      14                                         +----------+--------+--------+--------+------------------+--------+ ICA Prox  43      14              smooth                     +----------+--------+--------+--------+------------------+--------+ ICA Mid   76      34      1-39%                              +----------+--------+--------+--------+------------------+--------+ ICA Distal123     41                                         +----------+--------+--------+--------+------------------+--------+ ECA       81      13                                         +----------+--------+--------+--------+------------------+--------+ +----------+--------+-------+----------------+-------------------+           PSV cm/sEDV cmsDescribe        Arm Pressure (mmHG)  +----------+--------+-------+----------------+-------------------+ [id number]             Multiphasic, [id number]                 +----------+--------+-------+----------------+-------------------+ +---------+--------+--+--------+--+ VertebralPSV cm/s69EDV cm/s14 +---------+--------+--+--------+--+  Left Carotid Findings: +----------+--------+--------+--------+------------------+---------------------+           PSV cm/sEDV cm/sStenosisPlaque DescriptionComments              +----------+--------+--------+--------+------------------+---------------------+ CCA Prox  78      17                                                      +----------+--------+--------+--------+------------------+---------------------+ CCA Distal64      22                                                      +----------+--------+--------+--------+------------------+---------------------+ ICA Prox  89      24  1-39%   smooth            minimal                                                                   plaque/intimal                                                            hyperplasia           +----------+--------+--------+--------+------------------+---------------------+ ICA Mid   71      28                                                      +----------+--------+--------+--------+------------------+---------------------+ ICA Distal101     37                                                      +----------+--------+--------+--------+------------------+---------------------+ ECA       84      9                                                       +----------+--------+--------+--------+------------------+---------------------+ +----------+--------+--------+----------------+-------------------+           PSV cm/sEDV cm/sDescribe        Arm Pressure (mmHG) +----------+--------+--------+----------------+-------------------+ [id number]              Multiphasic, [id number]                 +----------+--------+--------+----------------+-------------------+ +---------+--------+--+--------+--+---------+ VertebralPSV cm/s48EDV cm/s16Antegrade +---------+--------+--+--------+--+---------+   Summary: Right Carotid: Velocities in the right ICA are consistent with a 1-39% stenosis. Left Carotid: Velocities in the left ICA are consistent with a 1-39% stenosis.               Minimal plaque/intimal hyperplasia note Vertebrals:  Bilateral vertebral arteries demonstrate antegrade flow. Subclavians: Normal flow hemodynamics were seen in bilateral subclavian              arteries. *See table(s) above for measurements and observations.  Electronically signed by Gordy Bergamo MD on 06/03/2023 at 9:45:03 AM.    Final     Assessment & Plan:  Need for immunization against influenza -     Flu vaccine HIGH DOSE PF(Fluzone Trivalent)  Acquired hypothyroidism- She is euthyroid.  Bradycardia -     EKG 12-Lead  Chronic hyperglycemia -     Basic metabolic panel with GFR; Future -     Hemoglobin A1c; Future  Other orders -     COVID-19 mRNA Vac-TriS(Pfizer); Inject 0.3 mLs into the muscle once for 1 dose.  Dispense: 0.3 mL; Refill:  0     Follow-up: Return in about 6 months (around 04/29/2024).  Debby Molt, MD

## 2023-10-30 NOTE — Patient Instructions (Addendum)
Premature Atrial Contraction  A premature atrial contraction [facility]) is a kind of irregular heartbeat (arrhythmia). The heart has four chambers, including the upper chambers (atria) and lower chambers (ventricles). Normally, an electrical signal starts in a group of cells called the sinoatrial node (SA node) and travels through the atria, causing them to pump blood into the ventricles. During a PAC, the atria beat too early, before they have had time to fill with blood. The heartbeat pauses afterward so the heart can fill with blood for the next beat. Sometimes PAC can be a warning sign of another type of arrhythmia called atrial fibrillation. Atrial fibrillation may allow blood to pool in the atria and form clots. If a clot travels to the brain, it can cause a stroke. What are the causes? The cause of this condition is often unknown. Sometimes, this condition may be caused by heart disease or injury to the heart. What increases the risk? You are more likely to develop this condition if: You have heart disease. You are 40 years of age or older. Episodes may be triggered by: Tobacco, alcohol, or caffeine use. Stimulant drugs. Some medicines or supplements. Stress and anxiety. What are the signs or symptoms? Symptoms of this condition include: The feeling of a pause in the heartbeat. The first heartbeat after the "skipped" beat may feel more forceful. A feeling that your heart is fluttering. A quick feeling of dizziness or faintness. How is this diagnosed? This condition is diagnosed based on: Your medical history or symptoms. A physical exam. Your health care provider may listen to your heart. An ECG (electrocardiogram) to monitor the electrical activity of your heart. An ambulatory cardiac monitor that records your heartbeats for 24 hours or more. You may also have: Blood tests. An echocardiogram, which creates an image of your heart. How is this treated? Treatment depends on how often  your symptoms happen and other risk factors. Treatments may include: Medicines. A catheter ablation procedure to destroy the part of the heart tissue that sends abnormal signals. In many cases, treatment may not be needed. Follow these instructions at home: Lifestyle  Do not use any products that contain nicotine or tobacco. These products include cigarettes, chewing tobacco, and vaping devices, such as e-cigarettes. If you need help quitting, ask your health care provider. Exercise regularly. Ask your health care provider what type of exercise is safe for you. Try to get at least 7-9 hours of sleep each night. Find healthy ways to manage stress. Avoid stressful situations when possible. Alcohol use Do not drink alcohol if: Your health care provider tells you not to drink. You are pregnant, may be pregnant, or are planning to become pregnant. Alcohol triggers your episodes. If you drink alcohol: Limit how much you have to: 0-1 drink a day for women. 0-2 drinks a day for men. Know how much alcohol is in your drink. In the [place] one drink equals one 12 oz bottle of beer (355 mL), one 5 oz glass of wine (148 mL), or one 1 oz glass of hard liquor (44 mL). General instructions Take over-the-counter and prescription medicines only as told by your health care provider. If caffeine triggers episodes, do not eat, drink, or use anything with caffeine in it. Contact a health care provider if: You feel your heart is fluttering. Your heart skips beats and you feel dizzy, light-headed, or very tired. Get help right away if: You have chest pain. You have trouble breathing. You faint. You have any symptoms of  a stroke. "BE FAST" is an easy way to remember the main warning signs of a stroke: B - Balance. Signs are dizziness, sudden trouble walking, or loss of balance. E - Eyes. Signs are trouble seeing or a sudden change in vision. F - Face. Signs are sudden weakness or numbness of the face, or the  face or eyelid drooping on one side. A - Arms. Signs are weakness or numbness in an arm. This happens suddenly and usually on one side of the body. S - Speech. Signs are sudden trouble speaking, slurred speech, or trouble understanding what people say. T - Time. Time to call emergency services. Write down what time symptoms started. You have other signs of stroke, such as: A sudden, severe headache with no known cause. Nausea or vomiting. Seizure. These symptoms may be an emergency. Get help right away. Call 911. Do not wait to see if the symptoms will go away. Do not drive yourself to the hospital. This information is not intended to replace advice given to you by your health care provider. Make sure you discuss any questions you have with your health care provider. Document Revised: 06/10/2021 Document Reviewed: 06/10/2021 Elsevier Patient Education  2024 [employer].

## 2023-11-06 DIAGNOSIS — R2990 Unspecified symptoms and signs involving the nervous system: Secondary | ICD-10-CM | POA: Diagnosis not present

## 2023-11-12 ENCOUNTER — Ambulatory Visit (INDEPENDENT_AMBULATORY_CARE_PROVIDER_SITE_OTHER): Payer: Medicare Other

## 2023-11-12 DIAGNOSIS — I495 Sick sinus syndrome: Secondary | ICD-10-CM | POA: Diagnosis not present

## 2023-11-13 DIAGNOSIS — E039 Hypothyroidism, unspecified: Secondary | ICD-10-CM | POA: Diagnosis not present

## 2023-11-13 DIAGNOSIS — K1321 Leukoplakia of oral mucosa, including tongue: Secondary | ICD-10-CM | POA: Diagnosis not present

## 2023-11-13 LAB — CUP PACEART REMOTE DEVICE CHECK
Battery Impedance: 6144 Ohm
Battery Remaining Longevity: 1 mo
Battery Voltage: 2.62 V
Brady Statistic AP VP Percent: 0 %
Brady Statistic AP VS Percent: 81 %
Brady Statistic AS VP Percent: 0 %
Brady Statistic AS VS Percent: 19 %
Date Time Interrogation Session: 20251020095923
Implantable Pulse Generator Implant Date: 20110310
Lead Channel Impedance Value: 393 Ohm
Lead Channel Impedance Value: 650 Ohm
Lead Channel Pacing Threshold Amplitude: 1 V
Lead Channel Pacing Threshold Amplitude: 1 V
Lead Channel Pacing Threshold Pulse Width: 0.4 ms
Lead Channel Pacing Threshold Pulse Width: 0.4 ms
Lead Channel Setting Pacing Amplitude: 2 V
Lead Channel Setting Pacing Amplitude: 2.5 V
Lead Channel Setting Pacing Pulse Width: 0.4 ms
Lead Channel Setting Sensing Sensitivity: 2.8 mV
Zone Setting Status: 755011
Zone Setting Status: 755011

## 2023-11-14 ENCOUNTER — Telehealth (HOSPITAL_BASED_OUTPATIENT_CLINIC_OR_DEPARTMENT_OTHER): Payer: Self-pay

## 2023-11-14 ENCOUNTER — Telehealth (HOSPITAL_BASED_OUTPATIENT_CLINIC_OR_DEPARTMENT_OTHER): Payer: Self-pay | Admitting: *Deleted

## 2023-11-14 NOTE — Telephone Encounter (Signed)
   Name: Amy Lane  DOB: July 22, 1950  MRN: [id number]  Primary Cardiologist: Maude Emmer, MD   Preoperative team, please contact this patient and set up a phone call appointment for further preoperative risk assessment. Please obtain consent and complete medication review. Thank you for your help.  I confirm that guidance regarding antiplatelet and oral anticoagulation therapy has been completed and, if necessary, noted below.  Requesting Eliquis  hold from pharmacy.  I also confirmed the patient resides in the state of  . As per [facility] Medical Board telemedicine laws, the patient must reside in the state in which the provider is licensed.   Lamarr Satterfield, NP 11/14/2023, 10:43 AM  [facility]

## 2023-11-14 NOTE — Telephone Encounter (Signed)
 Pharmacy please advise on holding Eliquis  prior to biopsy scheduled for TBD. (CBC 07/30/2023; BMET 10/30/2023).Thank you.

## 2023-11-14 NOTE — Telephone Encounter (Signed)
 Pt has been scheduled tele preop appt 11/21/23. Pt did tell me that her procedure was to be 11/16/23. I apologized that the requesting stated to us  TBD. Pt said they called her this morning with the date. Pt said she is fine and let's do the tele appt next week as planned.   Med rec and consent are done.     Patient Consent for Virtual Visit        Amy Lane has provided verbal consent on 11/14/2023 for a virtual visit (video or telephone).   CONSENT FOR VIRTUAL VISIT FOR:  Amy Lane  By participating in this virtual visit I agree to the following:  I hereby voluntarily request, consent and authorize [facility] and its employed or contracted physicians, physician assistants, nurse practitioners or other licensed health care professionals (the Practitioner), to provide me with telemedicine health care services (the "Services) as deemed necessary by the treating Practitioner. I acknowledge and consent to receive the Services by the Practitioner via telemedicine. I understand that the telemedicine visit will involve communicating with the Practitioner through live audiovisual communication technology and the disclosure of certain medical information by electronic transmission. I acknowledge that I have been given the opportunity to request an in-person assessment or other available alternative prior to the telemedicine visit and am voluntarily participating in the telemedicine visit.  I understand that I have the right to withhold or withdraw my consent to the use of telemedicine in the course of my care at any time, without affecting my right to future care or treatment, and that the Practitioner or I may terminate the telemedicine visit at any time. I understand that I have the right to inspect all information obtained and/or recorded in the course of the telemedicine visit and may receive copies of available information for a reasonable fee.  I understand that some of  the potential risks of receiving the Services via telemedicine include:  Delay or interruption in medical evaluation due to technological equipment failure or disruption; Information transmitted may not be sufficient (e.g. poor resolution of images) to allow for appropriate medical decision making by the Practitioner; and/or  In rare instances, security protocols could fail, causing a breach of personal health information.  Furthermore, I acknowledge that it is my responsibility to provide information about my medical history, conditions and care that is complete and accurate to the best of my ability. I acknowledge that Practitioner's advice, recommendations, and/or decision may be based on factors not within their control, such as incomplete or inaccurate data provided by me or distortions of diagnostic images or specimens that may result from electronic transmissions. I understand that the practice of medicine is not an exact science and that Practitioner makes no warranties or guarantees regarding treatment outcomes. I acknowledge that a copy of this consent can be made available to me via my patient portal [facility] MyChart), or I can request a printed copy by calling the office of Amy Lane.    I understand that my insurance will be billed for this visit.   I have read or had this consent read to me. I understand the contents of this consent, which adequately explains the benefits and risks of the Services being provided via telemedicine.  I have been provided ample opportunity to ask questions regarding this consent and the Services and have had my questions answered to my satisfaction. I give my informed consent for the services to be provided through the use of telemedicine  in my medical care

## 2023-11-14 NOTE — Telephone Encounter (Signed)
   Pre-operative Risk Assessment    Patient Name: Amy Lane  DOB: 01/11/1951 MRN: [id number]   Date of last office visit: 05/09/23 with Dr. Delford Date of next office visit: NA   Request for Surgical Clearance    Procedure:  Biopsy  Date of Surgery:  Clearance TBD                                Surgeon:  Dr. Alex Surgeon's Group or Practice Name:  [facility] Phone number:  [phone] Fax number:  not indicated   Type of Clearance Requested:   - Medical  - Pharmacy:  Hold Apixaban  (Eliquis ) not indicated   Type of Anesthesia:  Not Indicated   Additional requests/questions:    Bonney Augustin JONETTA Delores   11/14/2023, 10:12 AM

## 2023-11-14 NOTE — Telephone Encounter (Signed)
 Pt has been scheduled tele preop appt 11/21/23. Pt did tell me that her procedure was to be 11/16/23. I apologized that the requesting stated to us  TBD. Pt said they called her this morning with the date. Pt said she is fine and let's do the tele appt next week as planned.   Med rec and consent are done.

## 2023-11-15 NOTE — Telephone Encounter (Signed)
 Can we confirm what type of biopsy? Skin?

## 2023-11-16 NOTE — Progress Notes (Signed)
 Remote PPM Transmission

## 2023-11-16 NOTE — Telephone Encounter (Signed)
 I s/w Sonny with the DDS to confirm type of Bx and location.   WHITE LESION ON THE GUM

## 2023-11-17 ENCOUNTER — Ambulatory Visit: Payer: Self-pay | Admitting: Student in an Organized Health Care Education/Training Program

## 2023-11-18 NOTE — Progress Notes (Unsigned)
 Cardiology Office Note   Date:  11/19/2023  ID:  Amy Lane, DOB [birth year], MRN [id number] PCP: Amy Debby CROME, MD  [facility] Providers Cardiologist:  Amy Emmer, MD Electrophysiologist:  Amy DELENA Primus, MD   History of Present Illness Amy Lane is a 73 y.o. female with symptomatic sinus bradycardia, Ehlers-Danlos syndrome, MVP s/p MVR at [facility], prior TIA on warfarin, prior syncope thought to be 2/2 LQTS, dysautonomia/POTS who presents for device f/u.   She was last seen by Dr. Fernande on 12/27/2022 and was having intermittent lightheadedness and weakness associated with some diaphoresis at that time.  Dr. Fernande had thought she had neurally mediated dysautonomia.  He had recommended ongoing increase in fluid intake along with salt intake and supplements. History of Present Illness Amy Lane is a 73 year old female with a history of pacemaker implantation who presents for evaluation of her pacemaker function and potential replacement. She is accompanied by her husband, Amy Lane.  She has had a pacemaker since the early 2000s, with one original lead and another that has been replaced. Her current pacemaker system is not fully MRI conditional due to the combination of old and new leads, and the original lead is not MRI compatible. She inquires about the possibility of having an MRI with her current pacemaker leads.  Her pacemaker has been functioning well, with the current device lasting almost fifteen years. She is aware that the battery life of newer devices may be slightly less due to additional settings. Her device interrogations are reviewed monthly, and she will be notified when the device flags for replacement.  Her past medical history includes a noted delay from the top chamber to the bottom chamber of her heart, which has been present for some time and has worsened. This delay is currently measured at around 450 milliseconds. She uses her top chamber  lead almost all the time and is not currently using the bottom chamber lead significantly.  ROS: none  Studies Reviewed  ECG review 10/30/23: NSR 86, PR 254, QRS 135, QT/c 442/473, 1' AVB, RBBB, LPFB  TTE Result date: 11/15/22 1. Left ventricular ejection fraction, by estimation, is 55 to 60%. The left ventricle has normal function. The left ventricle has no regional wall motion abnormalities. There is moderate asymmetric left ventricular hypertrophy of the basal-septal segment. Left ventricular diastolic parameters are consistent with Grade I diastolic dysfunction (impaired relaxation). Elevated left ventricular end-diastolic pressure. 2. Right ventricular systolic function is moderately reduced. The right ventricular size is normal. There is normal pulmonary artery systolic pressure. The estimated right ventricular systolic pressure is 22.7 mmHg. 3. The mitral valve has been previously repaired. The anterior mitral valve demonstrates thickening and a hockeystick appearance suggestive of a possible rheumatic valve. The mitral valve has been repaired/replaced. Trivial mitral valve regurgitation. Mild mitral stenosis. The mean mitral valve gradient is 5.0 mmHg with average heart rate of 60 bpm. There is a present in the mitral position. 4. The aortic valve is tricuspid. Aortic valve regurgitation is not visualized. 5. Mildly dilated pulmonary artery. 6. The inferior vena cava is normal in size with greater than 50% respiratory variability, suggesting right atrial pressure of 3 mmHg.  Physical Exam VS:  BP 118/70   Pulse (!) 59   Ht 5' 11 (1.803 m)   Wt 148 lb 6.4 oz (67.3 kg)   SpO2 97%   BMI 20.70 kg/m       Wt Readings from Last 3 Encounters:  11/19/23  148 lb 6.4 oz (67.3 kg)  10/30/23 146 lb 3.2 oz (66.3 kg)  05/09/23 148 lb 6.4 oz (67.3 kg)    GEN: Well nourished, well developed in no acute distress CARDIAC: RRR, no murmurs, rubs, gallops RESPIRATORY:  Clear to auscultation  without rales, wheezing or rhonchi  EXTREMITIES:  No edema; No deformity  SKIN: LEFT infraclavicular incision without erythema or swelling   Device Information PPM: MDT Adapta, SN [id number], DOI 04/01/09 RA: MDT 4923, [id number] V, DOI 11/22/01 RV: SJM Pacesetter 1346T, SN [id number], DOI 11/28/96  Device interrogation AAIR-DDDR 60/130 PAV 150, SAV 120  ASVS 18.8% ASVP < 0.1% APVS 80.9% APVP 0.2% RA: 2.8 mV / 394 ohms / 1.0 V @ 0.4 ms  RV: 8 mV / 644 ohms / 1.25 V @ 0.4 ms   ASSESSMENT AND PLAN ADREONNA Lane is a 73 y.o. female with symptomatic sinus bradycardia, Ehlers-Danlos syndrome, MVP s/p MVR at [facility], prior TIA on warfarin, prior syncope thought to be 2/2 LQTS, dysautonomia/POTS who presents for device f/u.   Symptomatic sinus bradycardia LEFT MDT DC PPM  Dysautonomia/POTS MVP s/p MVR Device function within normal limits. Longevity < 1 mo, now on monthly monitoring. Minimal VP however with significant 1' AVB although completely asx. No significant sx recently. Continue monitoring for generator change.   Dispo: RTC 6 mo  A total of 45 minutes was spent preparing for the patient, reviewing history, performing exam, document encounter, coordinating care and counseling the patient. 27 minutes was spent with direct patient care.   Signed, Amy DELENA Primus, MD

## 2023-11-19 ENCOUNTER — Encounter: Payer: Self-pay | Admitting: Student in an Organized Health Care Education/Training Program

## 2023-11-19 ENCOUNTER — Ambulatory Visit: Payer: Self-pay | Admitting: Student in an Organized Health Care Education/Training Program

## 2023-11-19 ENCOUNTER — Ambulatory Visit
Attending: Student in an Organized Health Care Education/Training Program | Admitting: Student in an Organized Health Care Education/Training Program

## 2023-11-19 VITALS — BP 118/70 | HR 59 | Ht 71.0 in | Wt 148.4 lb

## 2023-11-19 DIAGNOSIS — I495 Sick sinus syndrome: Secondary | ICD-10-CM | POA: Insufficient documentation

## 2023-11-19 LAB — CUP PACEART INCLINIC DEVICE CHECK
Battery Impedance: 6398 Ohm
Battery Remaining Longevity: 1 mo — CL
Battery Voltage: 2.62 V
Brady Statistic AP VP Percent: 0 %
Brady Statistic AP VS Percent: 81 %
Brady Statistic AS VP Percent: 0 %
Brady Statistic AS VS Percent: 19 %
Date Time Interrogation Session: 20251027102710
Implantable Lead Connection Status: 753985
Implantable Lead Connection Status: 753985
Implantable Lead Implant Date: 19981106
Implantable Lead Implant Date: 20031031
Implantable Lead Location: 753859
Implantable Lead Location: 753860
Implantable Lead Model: 5076
Implantable Pulse Generator Implant Date: 20110310
Lead Channel Impedance Value: 394 Ohm
Lead Channel Impedance Value: 644 Ohm
Lead Channel Pacing Threshold Amplitude: 1 V
Lead Channel Pacing Threshold Amplitude: 1.25 V
Lead Channel Pacing Threshold Pulse Width: 0.4 ms
Lead Channel Pacing Threshold Pulse Width: 0.4 ms
Lead Channel Sensing Intrinsic Amplitude: 2 mV
Lead Channel Sensing Intrinsic Amplitude: 5.6 mV
Lead Channel Setting Pacing Amplitude: 2 V
Lead Channel Setting Pacing Amplitude: 2.5 V
Lead Channel Setting Pacing Pulse Width: 0.4 ms
Lead Channel Setting Sensing Sensitivity: 2 mV
Zone Setting Status: 755011
Zone Setting Status: 755011

## 2023-11-19 NOTE — Patient Instructions (Addendum)
 Medication Instructions:  Your physician recommends that you continue on your current medications as directed. Please refer to the Current Medication list given to you today.  *If you need a refill on your cardiac medications before your next appointment, please call your pharmacy*  Lab Work: None ordered.  You may go to any [facility] Location for your lab work:  [facility] - [address] ([facility]) - [address] [phone] [address]  [place] - [address]   [place]  - [address]   [place] - [address] - [address] [employer]  - [address] ([facility]   If you have labs (blood work) drawn today and your tests are completely normal, you will receive your results only by: [employer] (if you have MyChart)  If you have any lab test that is abnormal or we need to change your treatment, we will call you or send a MyChart message to review the results.  Testing/Procedures: None ordered.  Follow-Up: At [facility], you and your health needs are our priority.  As part of our continuing mission to provide you with exceptional heart care, we have created designated Provider Care Teams.  These Care Teams include your primary Cardiologist (physician) and Advanced Practice Providers (APPs -  Physician Assistants and Nurse Practitioners) who all work together to provide you with the care you need, when you need it.  Your next appointment:   6 months  The format for your next appointment:   In Person  Provider:   Donnice Primus, MD or one of the following Advanced Practice Providers on your designated Care Team:   Charlies Arthur, [place] Ozell Jodie Passey, [place] Leotis Barrack, NP  Note: Remote monitoring is used to monitor your Pacemaker/ ICD from home. This monitoring reduces the number of office visits required to check  your device to one time per year. It allows us  to keep an eye on the functioning of your device to ensure it is working properly.

## 2023-11-21 ENCOUNTER — Ambulatory Visit: Attending: Cardiology | Admitting: Emergency Medicine

## 2023-11-21 DIAGNOSIS — Z01810 Encounter for preprocedural cardiovascular examination: Secondary | ICD-10-CM | POA: Insufficient documentation

## 2023-11-21 NOTE — Progress Notes (Signed)
 Virtual Visit via Telephone Note   Because of [name] co-morbid illnesses, she is at least at moderate risk for complications without adequate follow up.  This format is felt to be most appropriate for this patient at this time.  Due to technical limitations with video connection (technology), today's appointment will be conducted as an audio only telehealth visit, and Amy Lane verbally agreed to proceed in this manner.   All issues noted in this document were discussed and addressed.  No physical exam could be performed with this format.  Evaluation Performed:  Preoperative cardiovascular risk assessment _____________   Date:  11/21/2023   Patient ID:  Amy Lane, DOB 03-Sep-1950, MRN [id number] Patient Location:  Home Provider location:   Office  Primary Care Provider:  Joshua Debby CROME, MD Primary Cardiologist:  Amy Emmer, MD  Chief Complaint / Patient Profile   73 y.o. y/o female with a h/o symptomatic sinus bradycardia s/p PPM, Ehlers-Danlos syndrome, mitral valve prolapse s/p MVR, prior TIA on warfarin, prior syncope thought to be due to LQTS, dysautonomia/POTS who is pending biopsy lesion of the gum and presents today for telephonic preoperative cardiovascular risk assessment.  History of Present Illness    Amy Lane is a 72 y.o. female who presents via audio/video conferencing for a telehealth visit today.  Pt was last seen in cardiology clinic on 11/19/2023 by Dr Almetta.  At that time Amy Lane was doing well.  The patient is now pending procedure as outlined above.   Since her last visit, she denies chest pain, shortness of breath, lower extremity edema, fatigue, palpitations, melena, hematuria, hemoptysis, diaphoresis, weakness, presyncope, syncope, orthopnea, and PND.  Today patient is doing well without acute cardiovascular concerns.  She tells me she has began exercising and walks a mile daily without exertional chest pains or  dyspnea.  She tells me she feels much improved since starting on Keppra as well.  She is planning to have an EEG at home done soon.  Overall from a cardiovascular standpoint she is stable without symptoms concerning for active angina.  Overall she is able to complete greater than 4 METS.  Past Medical History    Past Medical History:  Diagnosis Date   Abnormal gait    Allergic rhinitis    Anxiety    Atrial fibrillation (HCC)    Carcinomas, basal cell    Cardiac pacemaker    DDD  MDT   Cerebrovascular disease    Clotting disorder    DVT   Depression    Dysautonomia (HCC)    Ehler's-Danlos syndrome    Fibromyalgia    Headache(784.0)    Migraine   Heart murmur    Hemorrhoids    Herpes zoster    History of syncope    History of tachycardia-bradycardia syndrome    History of transient ischemic attack    Hypercholesteremia    Hypercholesterolemia    Hypothyroidism    Long term (current) use of anticoagulants    Migraine with aura 06/21/2017   Episodes of aphasia and confusion   Mitral valve prolapse    PAT (paroxysmal atrial tachycardia)    PSVT (paroxysmal supraventricular tachycardia)    Skin desquamation    inflammative vaginitis   Stroke (HCC)    SVT (supraventricular tachycardia)    Past Surgical History:  Procedure Laterality Date   ABLATION     x 2   biopsy     gum   CAROTID ENDARTERECTOMY  complex mitral valve repair     at the [facility]   INSERT / REPLACE / REMOVE PACEMAKER     LEAD REVISION     left carotid endarterectomy  11/2003   by Dr. JUDITHANN Lane   PACEMAKER INSERTION     medtronic kappa 901   removed chalazion from left eye lid  09/2011   Dr. Patrcia    Allergies  Allergies  Allergen Reactions   Dapagliflozin-Saxagliptin     Other reaction(s): bradycardia   Statins Other (See Comments)    Muscle aches   Lovastatin     Other reaction(s): myalgias (muscle pain)   Adhesive [Tape] Rash   Latex Rash    Home Medications    Prior to  Admission medications   Medication Sig Start Date End Date Taking? Authorizing Provider  acebutolol  (SECTRAL ) 200 MG capsule TAKE 1 CAPSULE BY MOUTH EVERYDAY AT BEDTIME 06/20/23   Delford Amy BROCKS, MD  apixaban  (ELIQUIS ) 5 MG TABS tablet Take 1 tablet (5 mg total) by mouth 2 (two) times daily. 07/16/23   Delford Amy BROCKS, MD  citalopram  (CELEXA ) 20 MG tablet TAKE 1 TABLET BY MOUTH EVERY DAY 09/12/23   Amy Debby CROME, MD  docusate sodium  (COLACE) 100 MG capsule Take 100 mg by mouth every evening.    [provider]  ezetimibe  (ZETIA ) 10 MG tablet TAKE 1 TABLET BY MOUTH EVERY DAY 06/21/23   Nishan, Peter C, MD  levETIRAcetam (KEPPRA) 750 MG tablet Take 750 mg by mouth every 12 (twelve) hours. 10/23/23 10/22/24  [provider]  levothyroxine  (SYNTHROID ) 50 MCG tablet Take 50 mcg by mouth daily. Take 1 tablet 6 days a week and on day 7 take 2 tablets    [provider]  LORazepam  (ATIVAN ) 1 MG tablet TAKE 1 TABLET BY MOUTH AT BEDTIME FOR ANXIETY 09/12/23   Amy Debby CROME, MD  rosuvastatin  (CRESTOR ) 10 MG tablet TAKE 1 TABLET BY MOUTH EVERY DAY 09/11/23   Delford Amy BROCKS, MD    Physical Exam    Vital Signs:  Amy Lane does not have vital signs available for review today.  Given telephonic nature of communication, physical exam is limited. AAOx3. NAD. Normal affect.  Speech and respirations are unlabored.  Accessory Clinical Findings    None  Assessment & Plan    1.  Preoperative Cardiovascular Risk Assessment: According to the Revised Cardiac Risk Index (RCRI), her Perioperative Risk of Major Cardiac Event is (%): 0.9. Her Functional Capacity in METs is: 5.07 according to the Duke Activity Status Index (DASI).  Therefore, based on ACC/AHA guidelines, patient would be at acceptable risk for the planned procedure without further cardiovascular testing. I will route this recommendation to the requesting party via Epic fax function.  The patient was advised that if  she develops new symptoms prior to surgery to contact our office to arrange for a follow-up visit, and she verbalized understanding.  Patient does require pre-op antibiotics for dental procedure.   I do not feel as though Eliquis  needs to be held for this procedure. If MD really wants it held, patient may hold Eliquis  for 1 day prior.   A copy of this note will be routed to requesting surgeon.  Time:   Today, I have spent 10 minutes with the patient with telehealth technology discussing medical history, symptoms, and management plan.     Lum Lane Louis, NP  11/21/2023, 11:18 AM

## 2023-11-21 NOTE — Telephone Encounter (Signed)
 Patient with diagnosis of afib on Eliquis  for anticoagulation.    Procedure: biopsy of WHITE LESION ON THE GUM  Date of procedure: TBD   CHA2DS2-VASc Score = 4   This indicates a 4.8% annual risk of stroke. The patient's score is based upon: CHF History: 0 HTN History: 0 Diabetes History: 0 Stroke History: 2 Vascular Disease History: 0 Age Score: 1 Gender Score: 1      CrCl 78 ml/min Platelet count 158  Patient has not had an Afib/aflutter ablation in the last 3 months, DCCV within the last 4 weeks or a watchman implanted in the last 45 days   Patient does require pre-op antibiotics for dental procedure.  I do not feel as though Eliquis  needs to be held for this procedure. If MD really wants it held, patient may hold Eliquis  for 1 day prior.   **This guidance is not considered finalized until pre-operative APP has relayed final recommendations.**

## 2023-11-22 ENCOUNTER — Encounter: Payer: Self-pay | Admitting: Internal Medicine

## 2023-11-23 DIAGNOSIS — G40909 Epilepsy, unspecified, not intractable, without status epilepticus: Secondary | ICD-10-CM | POA: Diagnosis not present

## 2023-11-24 DIAGNOSIS — G40909 Epilepsy, unspecified, not intractable, without status epilepticus: Secondary | ICD-10-CM | POA: Diagnosis not present

## 2023-11-24 NOTE — Telephone Encounter (Signed)
She needs to be seen for blood work.

## 2023-11-25 DIAGNOSIS — G40909 Epilepsy, unspecified, not intractable, without status epilepticus: Secondary | ICD-10-CM | POA: Diagnosis not present

## 2023-12-03 DIAGNOSIS — K1370 Unspecified lesions of oral mucosa: Secondary | ICD-10-CM | POA: Diagnosis not present

## 2023-12-03 DIAGNOSIS — K1321 Leukoplakia of oral mucosa, including tongue: Secondary | ICD-10-CM | POA: Diagnosis not present

## 2023-12-03 DIAGNOSIS — G40909 Epilepsy, unspecified, not intractable, without status epilepticus: Secondary | ICD-10-CM | POA: Diagnosis not present

## 2023-12-03 IMAGING — MG MM DIGITAL SCREENING BILAT W/ TOMO AND CAD
6 of 10 series · 6 of 30 positions shown · non-contrast
Comparison: Previous exam(s).

CLINICAL DATA: Screening.

EXAM:
DIGITAL SCREENING BILATERAL MAMMOGRAM WITH TOMOSYNTHESIS AND CAD
TECHNIQUE: Bilateral screening digital craniocaudal and mediolateral oblique
mammograms were obtained. Bilateral screening digital breast
tomosynthesis was performed. The images were evaluated with
computer-aided detection.

[R CC synth-2D (1 of 2)]
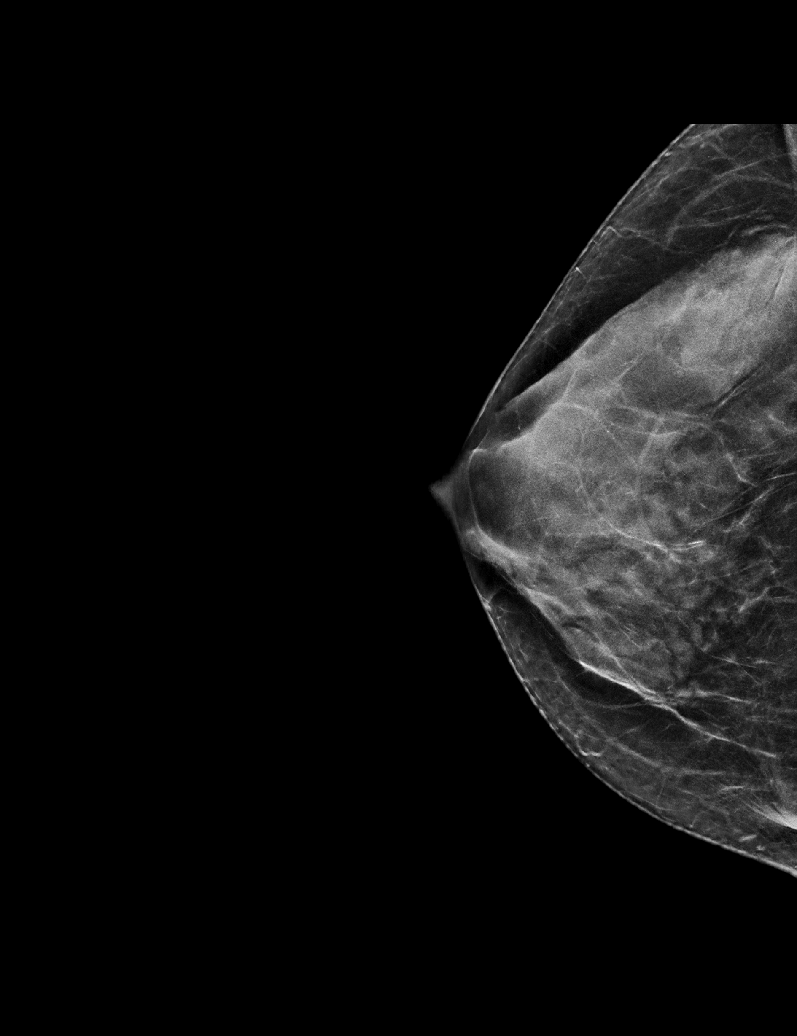

[R MLO synth-2D]
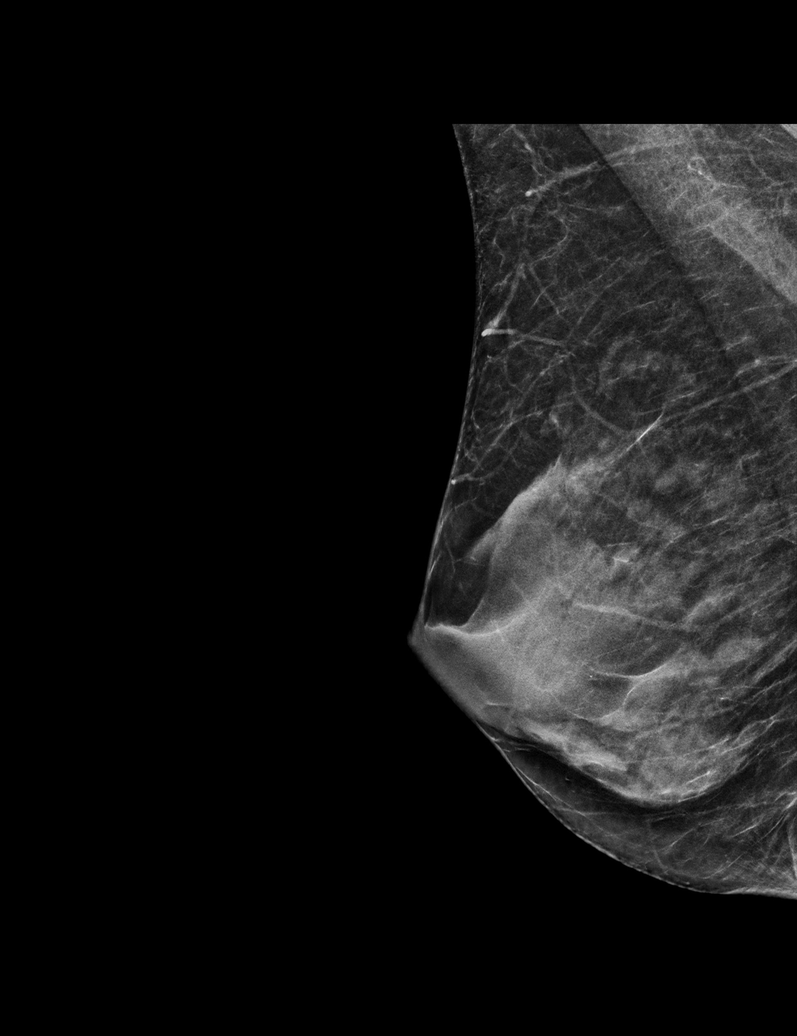

[L CC synth-2D]
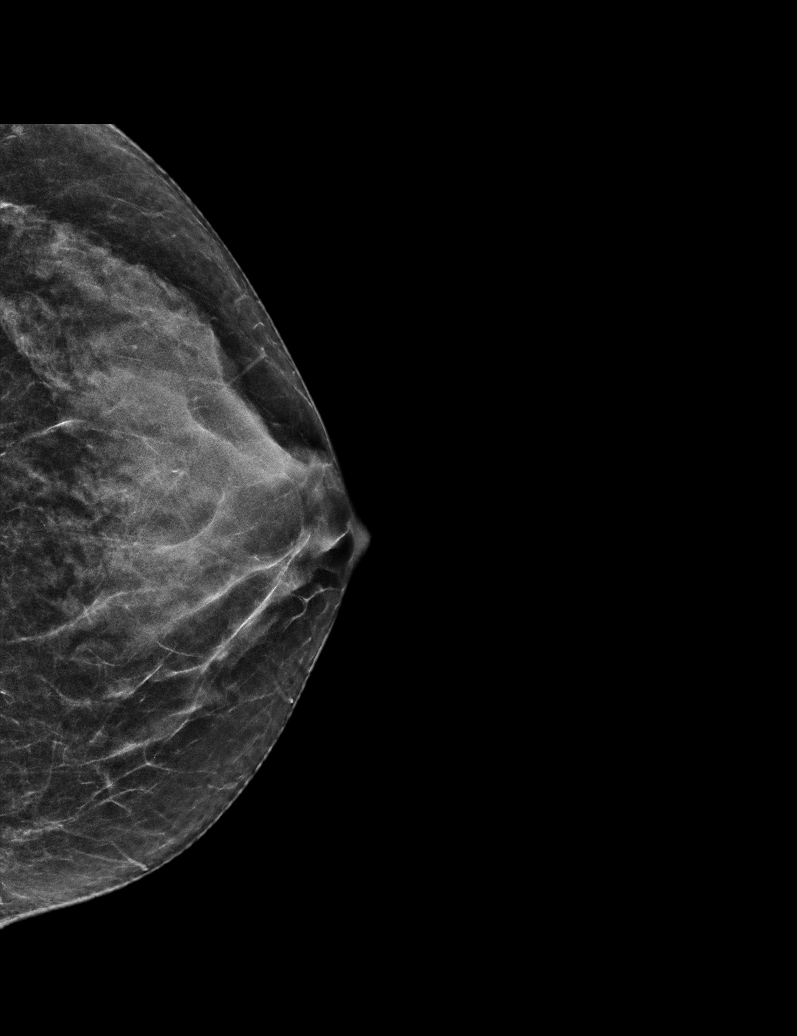

[R CC synth-2D (2 of 2)]
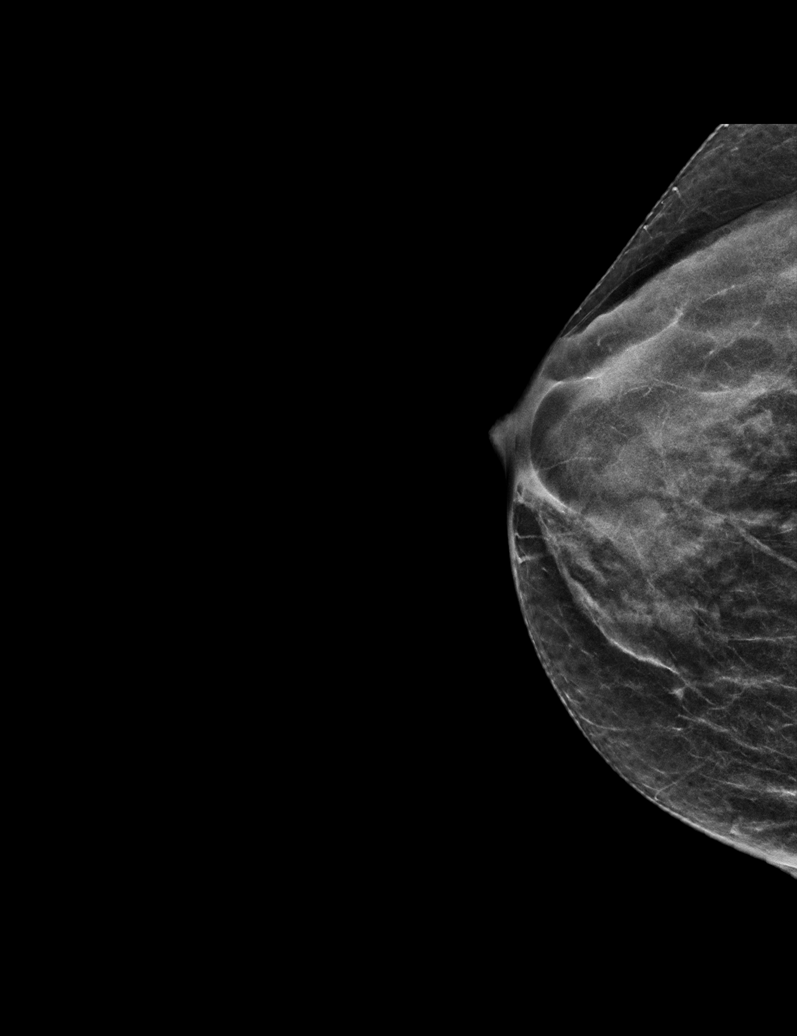

[L MLO synth-2D]
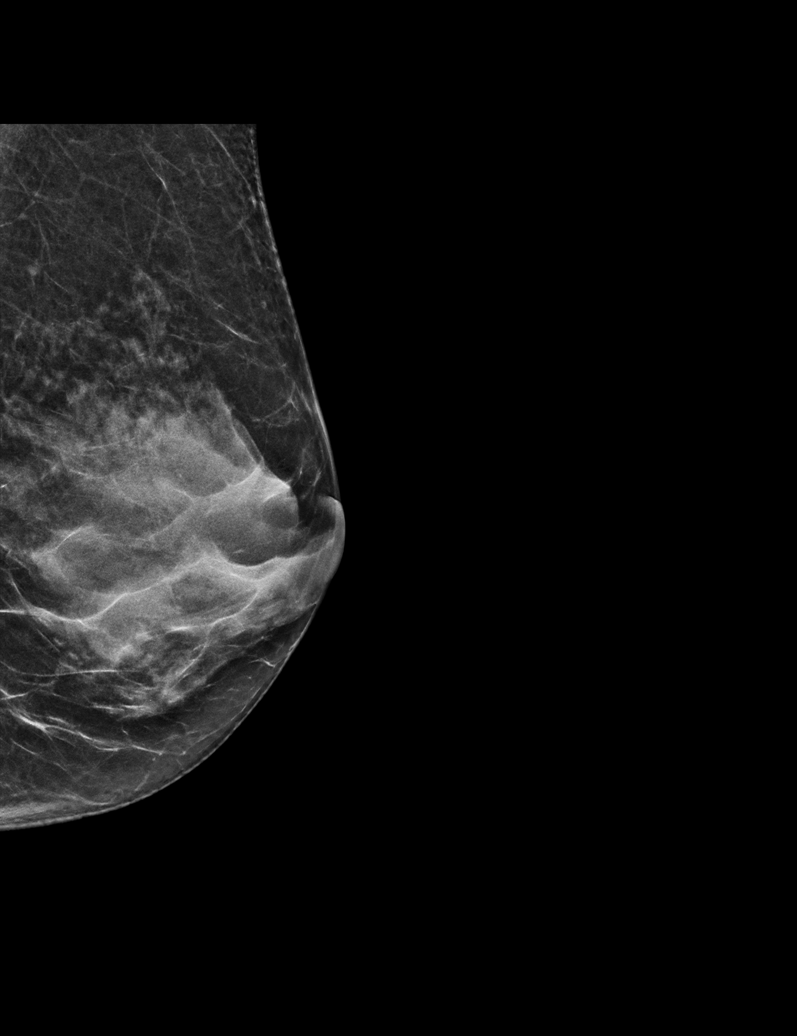

[R CC tomo · tomo slice 23/45.0]
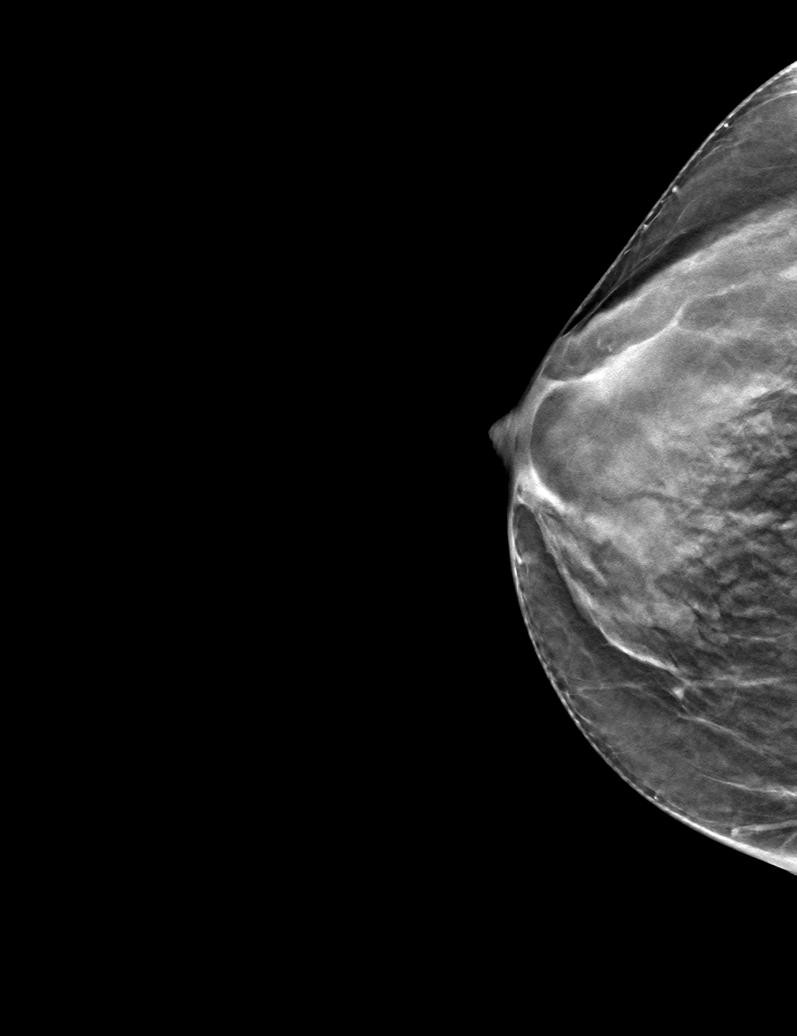

[6 of 30 positions shown; findings below may reference images not displayed]

ACR Breast Density Category d: The breast tissue is extremely dense,
which lowers the sensitivity of mammography
FINDINGS: There are no findings suspicious for malignancy.
IMPRESSION: No mammographic evidence of malignancy. A result letter of this
screening mammogram will be mailed directly to the patient.

RECOMMENDATION:
Screening mammogram in one year. (Code:[id number]-[id number])

BI-RADS CATEGORY  1: Negative.

## 2023-12-11 DIAGNOSIS — Z833 Family history of diabetes mellitus: Secondary | ICD-10-CM | POA: Diagnosis not present

## 2023-12-11 DIAGNOSIS — K5900 Constipation, unspecified: Secondary | ICD-10-CM | POA: Diagnosis not present

## 2023-12-11 DIAGNOSIS — R7303 Prediabetes: Secondary | ICD-10-CM | POA: Diagnosis not present

## 2023-12-11 DIAGNOSIS — E039 Hypothyroidism, unspecified: Secondary | ICD-10-CM | POA: Diagnosis not present

## 2023-12-13 ENCOUNTER — Telehealth: Payer: Self-pay

## 2023-12-13 ENCOUNTER — Ambulatory Visit: Attending: Internal Medicine

## 2023-12-13 LAB — CUP PACEART REMOTE DEVICE CHECK
Battery Impedance: 7466 Ohm
Battery Voltage: 2.61 V
Brady Statistic RV Percent Paced: 0 %
Date Time Interrogation Session: 20251120131814
Implantable Lead Connection Status: 753985
Implantable Lead Connection Status: 753985
Implantable Lead Implant Date: 19981106
Implantable Lead Implant Date: 20031031
Implantable Lead Location: 753859
Implantable Lead Location: 753860
Implantable Lead Model: 5076
Implantable Pulse Generator Implant Date: 20110310
Lead Channel Impedance Value: 614 Ohm
Lead Channel Impedance Value: 67 Ohm
Lead Channel Setting Pacing Amplitude: 2.5 V
Lead Channel Setting Pacing Pulse Width: 0.4 ms
Lead Channel Setting Sensing Sensitivity: 2.8 mV
Zone Setting Status: 755011
Zone Setting Status: 755011

## 2023-12-13 NOTE — Telephone Encounter (Signed)
 Alert received:  Monthly battery check.  RRT reached 11/30/2023.    Will notify Pt and forward to scheduling.

## 2023-12-13 NOTE — Telephone Encounter (Signed)
 Patient called and notified of battery at [facility] and that someone will be in touch with her to schedule her procedure date  Message sent to EP scheduling and April Garrison to schedule procedure date   Patient very appreciative of call

## 2023-12-14 NOTE — Telephone Encounter (Signed)
 Spoke with the patient and scheduled her for a gen change with Dr. Almetta on 12/17.

## 2023-12-19 ENCOUNTER — Other Ambulatory Visit (HOSPITAL_COMMUNITY): Payer: Self-pay

## 2023-12-19 DIAGNOSIS — Z01812 Encounter for preprocedural laboratory examination: Secondary | ICD-10-CM

## 2023-12-19 DIAGNOSIS — I495 Sick sinus syndrome: Secondary | ICD-10-CM

## 2023-12-19 NOTE — Telephone Encounter (Signed)
 Following up on recs for holding Eliquis  prior to PPM gen change on 12/17. Please advise, thanks.

## 2023-12-24 ENCOUNTER — Encounter (HOSPITAL_COMMUNITY): Payer: Self-pay

## 2023-12-24 ENCOUNTER — Telehealth (HOSPITAL_COMMUNITY): Payer: Self-pay

## 2023-12-24 NOTE — Telephone Encounter (Signed)
 Spoke with patient to discuss upcoming procedure.   Confirmed patient is scheduled for a PPM generator change on Wednesday, December 17 with Dr. Dr. Almetta. Instructed patient to arrive at the Main Entrance A at [facility]: [address] [place], [place] [address] and check in at Admitting at 10:30 AM.   Labs to be completed  Any recent signs of acute illness or been started on antibiotics? No. Reports she had a dental gum biopsy on 11/19, which has almost healed. She denied any s/o infection. Completed prophylactic antibiotics.  Any new medications started? No   Medication instructions:  On the morning of your procedure, take all medications with sips of water EXCEPT DO NOT take Eliquis  the morning of procedure. No eating or drinking after midnight prior to procedure.   The night before your procedure and the morning of your procedure, wash thoroughly with the CHG surgical soap from the neck down, paying special attention to the area where your procedure will be performed.  Plan to go home the same day, you will only stay overnight if medically necessary. You MUST have a responsible adult to drive you home and MUST be with you the first 24 hours after you arrive home.  Informed patient a nurse will call a day before the procedure to confirm arrival time and ensure instructions are followed.  Patient verbalized understanding to all instructions provided and agreed to proceed with procedure.

## 2023-12-24 NOTE — Telephone Encounter (Signed)
 Per Dr. Almetta, can hold morning dose of Eliquis  but otherwise no interruption.

## 2023-12-24 NOTE — Telephone Encounter (Signed)
 Attempted to reach patient to discuss upcoming procedure, no answer. Left VM for patient to return call.

## 2023-12-26 DIAGNOSIS — Z01812 Encounter for preprocedural laboratory examination: Secondary | ICD-10-CM | POA: Diagnosis not present

## 2023-12-26 DIAGNOSIS — I495 Sick sinus syndrome: Secondary | ICD-10-CM | POA: Diagnosis not present

## 2023-12-27 LAB — BASIC METABOLIC PANEL WITH GFR
BUN/Creatinine Ratio: 12 (ref 12–28)
BUN: 9 mg/dL (ref 8–27)
CO2: 23 mmol/L (ref 20–29)
Calcium: 9.1 mg/dL (ref 8.7–10.3)
Chloride: 102 mmol/L (ref 96–106)
Creatinine, Ser: 0.78 mg/dL (ref 0.57–1.00)
Glucose: 91 mg/dL (ref 70–99)
Potassium: 4.5 mmol/L (ref 3.5–5.2)
Sodium: 137 mmol/L (ref 134–144)
eGFR: 81 mL/min/1.73 (ref >59)

## 2023-12-27 LAB — CBC
Hematocrit: 35.7 % (ref 34.0–46.6)
Hemoglobin: 11.7 g/dL (ref 11.1–15.9)
MCH: 30.9 pg (ref 26.6–33.0)
MCHC: 32.8 g/dL (ref 31.5–35.7)
MCV: 94 fL (ref 79–97)
Platelets: 156 x10E3/uL (ref 150–450)
RBC: 3.79 x10E6/uL (ref 3.77–5.28)
RDW: 12.7 % (ref 11.7–15.4)
WBC: 5.1 x10E3/uL (ref 3.4–10.8)

## 2024-01-01 ENCOUNTER — Ambulatory Visit: Payer: Self-pay | Admitting: Student in an Organized Health Care Education/Training Program

## 2024-01-02 ENCOUNTER — Other Ambulatory Visit: Payer: Self-pay | Admitting: Internal Medicine

## 2024-01-02 DIAGNOSIS — F411 Generalized anxiety disorder: Secondary | ICD-10-CM

## 2024-01-03 ENCOUNTER — Ambulatory Visit: Payer: Self-pay

## 2024-01-03 NOTE — Telephone Encounter (Signed)
 Copied from CRM #[id number]. Topic: Clinical - Red Word Triage >> Jan 03, 2024 10:41 AM Jayma L wrote: Red Word that prompted transfer to Nurse Triage: patient without her meds and going thru withdrawal, having nightmares and doesn't feel good Lorazepam   Can't get individual pills Nightmares and didn't sleep  [facility]  Reason for Disposition  [1] Caller has NON-URGENT medicine question about med that PCP prescribed AND [2] triager unable to answer question  Answer Assessment - Initial Assessment Questions 1. NAME of MEDICINE: What medicine(s) are you calling about?     Request for Lorazepam  in chart from 12/10. Patient did not have medication yesterday and said she had terrible nightmares about her neices murder that happen in 1990's. States she got about 1 hour of bad sleep and just feels bad today. A little Nauseous. Denies shaking or headache.  Protocols used: Medication Question Call-A-AH

## 2024-01-04 NOTE — Telephone Encounter (Signed)
 Medication was sent in

## 2024-01-08 NOTE — Pre-Procedure Instructions (Signed)
 Attempted to call patient regarding procedure instructions.  Left voicemail on the following items: Arrival time 1030 Nothing to eat or drink after midnight No meds AM of procedure Responsible person to drive you home and stay with you for 24 hrs Wash with special soap night before and morning of procedure If on anti-coagulant drug instructions Don't take Eliquis  tomorrow morning.

## 2024-01-09 ENCOUNTER — Other Ambulatory Visit: Payer: Self-pay

## 2024-01-09 ENCOUNTER — Ambulatory Visit (HOSPITAL_COMMUNITY)
Admission: RE | Admit: 2024-01-09 | Discharge: 2024-01-09 | Disposition: A | Discharge status: Home / Self Care | Attending: Student in an Organized Health Care Education/Training Program | Admitting: Student in an Organized Health Care Education/Training Program

## 2024-01-09 ENCOUNTER — Encounter (HOSPITAL_COMMUNITY)
Admission: RE | Disposition: A | Payer: Self-pay | Source: Home / Self Care | Attending: Student in an Organized Health Care Education/Training Program

## 2024-01-09 DIAGNOSIS — G90A Postural orthostatic tachycardia syndrome (POTS): Secondary | ICD-10-CM | POA: Insufficient documentation

## 2024-01-09 DIAGNOSIS — Z7901 Long term (current) use of anticoagulants: Secondary | ICD-10-CM | POA: Insufficient documentation

## 2024-01-09 DIAGNOSIS — Z8673 Personal history of transient ischemic attack (TIA), and cerebral infarction without residual deficits: Secondary | ICD-10-CM | POA: Diagnosis not present

## 2024-01-09 DIAGNOSIS — G901 Familial dysautonomia [Riley-Day]: Secondary | ICD-10-CM | POA: Diagnosis not present

## 2024-01-09 DIAGNOSIS — Z952 Presence of prosthetic heart valve: Secondary | ICD-10-CM | POA: Insufficient documentation

## 2024-01-09 DIAGNOSIS — I495 Sick sinus syndrome: Secondary | ICD-10-CM | POA: Insufficient documentation

## 2024-01-09 DIAGNOSIS — Z45010 Encounter for checking and testing of cardiac pacemaker pulse generator [battery]: Secondary | ICD-10-CM | POA: Insufficient documentation

## 2024-01-09 DIAGNOSIS — Q796 Ehlers-Danlos syndrome, unspecified: Secondary | ICD-10-CM | POA: Diagnosis not present

## 2024-01-09 HISTORY — PX: PPM GENERATOR CHANGEOUT: EP1233

## 2024-01-09 SURGERY — PPM GENERATOR CHANGEOUT

## 2024-01-09 MED ORDER — ACETAMINOPHEN 325 MG PO TABS: 325.0000 mg | ORAL_TABLET | ORAL | Status: DC | PRN

## 2024-01-09 MED ORDER — FENTANYL CITRATE (PF) 100 MCG/2ML IJ SOLN
INTRAMUSCULAR | Status: DC | PRN
  Administered 2024-01-09 (×2): 25 ug via INTRAVENOUS

## 2024-01-09 MED ORDER — SODIUM CHLORIDE 0.9 % IV SOLN: INTRAVENOUS | Status: DC

## 2024-01-09 MED ORDER — MIDAZOLAM HCL 5 MG/5ML IJ SOLN
INTRAMUSCULAR | Status: DC | PRN
  Administered 2024-01-09: 15:00:00 .5 mg via INTRAVENOUS
  Administered 2024-01-09: 15:00:00 1 mg via INTRAVENOUS

## 2024-01-09 MED ORDER — LIDOCAINE HCL (PF) 1 % IJ SOLN
INTRAMUSCULAR | Status: AC
  Filled 2024-01-09: qty 60

## 2024-01-09 MED ORDER — LIDOCAINE HCL (PF) 1 % IJ SOLN
INTRAMUSCULAR | Status: DC | PRN
  Administered 2024-01-09: 15:00:00 30 mL

## 2024-01-09 MED ORDER — HEPARIN (PORCINE) IN NACL 1000-0.9 UT/500ML-% IV SOLN
INTRAVENOUS | Status: DC | PRN
  Administered 2024-01-09: 15:00:00 500 mL

## 2024-01-09 MED ORDER — SODIUM CHLORIDE 0.9 % IV SOLN
INTRAVENOUS | Status: AC
  Administered 2024-01-09: 15:00:00 80 mg
  Filled 2024-01-09: qty 2

## 2024-01-09 MED ORDER — POVIDONE-IODINE 10 % EX SWAB: 2.0000 | Freq: Once | CUTANEOUS | Status: DC

## 2024-01-09 MED ORDER — CEFAZOLIN SODIUM-DEXTROSE 2-4 GM/100ML-% IV SOLN
INTRAVENOUS | Status: AC
  Administered 2024-01-09: 15:00:00 2 g via INTRAVENOUS
  Filled 2024-01-09: qty 100

## 2024-01-09 MED ORDER — FENTANYL CITRATE (PF) 100 MCG/2ML IJ SOLN
INTRAMUSCULAR | Status: AC
  Filled 2024-01-09: qty 2

## 2024-01-09 MED ORDER — MIDAZOLAM HCL 2 MG/2ML IJ SOLN
INTRAMUSCULAR | Status: AC
  Filled 2024-01-09: qty 2

## 2024-01-09 MED ORDER — SODIUM CHLORIDE 0.9 % IV SOLN: 80.0000 mg | INTRAVENOUS | Status: AC

## 2024-01-09 MED ORDER — CEFAZOLIN SODIUM-DEXTROSE 2-4 GM/100ML-% IV SOLN: 2.0000 g | INTRAVENOUS | Status: AC

## 2024-01-09 MED FILL — Chlorhexidine Gluconate Soln 4%: 4.0000 | CUTANEOUS | Qty: 60 | Status: AC

## 2024-01-09 SURGICAL SUPPLY — 5 items
CABLE SURGICAL S-101-97-12 (CABLE) ×1 IMPLANT
IPG PACE AZUR XT DR MRI W1DR01 (Pacemaker) IMPLANT
PAD DEFIB RADIO PHYSIO CONN (PAD) ×1 IMPLANT
POUCH AIGIS-R ANTIBACT PPM MED (Mesh General) IMPLANT
TRAY PACEMAKER INSERTION (PACKS) ×1 IMPLANT

## 2024-01-09 NOTE — Discharge Instructions (Signed)

## 2024-01-09 NOTE — Progress Notes (Signed)
 Nurse to the bedside to assist patient with dressing, IV removed with tip in tact. Procedure insertion site free from discharge, no dressing in place, dermabond intact. Patient ready for discharge.

## 2024-01-09 NOTE — H&P (Signed)
 Date: 01/09/24 ID:  Amy Lane, DOB Jul 22, 1950, MRN [id number] PCP: Joshua Debby CROME, MD  [facility] Providers Cardiologist:  Maude Emmer, MD Electrophysiologist:  Donnice DELENA Primus, MD    History of Present Illness Amy Lane is a 73 y.o. female with symptomatic sinus bradycardia, Ehlers-Danlos syndrome, MVP s/p MVR at [facility], prior TIA on warfarin, prior syncope thought to be 2/2 LQTS, dysautonomia/POTS who presents for device f/u.    She was last seen by Dr. Fernande on 12/27/2022 and was having intermittent lightheadedness and weakness associated with some diaphoresis at that time.  Dr. Fernande had thought she had neurally mediated dysautonomia.  He had recommended ongoing increase in fluid intake along with salt intake and supplements. History of Present Illness Amy Lane is a 73 year old female with a history of pacemaker implantation who presents for evaluation of her pacemaker function and potential replacement. She is accompanied by her husband, Amy Lane.   She has had a pacemaker since the early 2000s, with one original lead and another that has been replaced. Her current pacemaker system is not fully MRI conditional due to the combination of old and new leads, and the original lead is not MRI compatible. She inquires about the possibility of having an MRI with her current pacemaker leads.   Her pacemaker has been functioning well, with the current device lasting almost fifteen years. She is aware that the battery life of newer devices may be slightly less due to additional settings. Her device interrogations are reviewed monthly, and she will be notified when the device flags for replacement.   Her past medical history includes a noted delay from the top chamber to the bottom chamber of her heart, which has been present for some time and has worsened. This delay is currently measured at around 450 milliseconds. She uses her top chamber lead almost all the time  and is not currently using the bottom chamber lead significantly.   ROS: none   Studies Reviewed   ECG review 10/30/23: NSR 86, PR 254, QRS 135, QT/c 442/473, 1' AVB, RBBB, LPFB   TTE Result date: 11/15/22 1. Left ventricular ejection fraction, by estimation, is 55 to 60%. The left ventricle has normal function. The left ventricle has no regional wall motion abnormalities. There is moderate asymmetric left ventricular hypertrophy of the basal-septal segment. Left ventricular diastolic parameters are consistent with Grade I diastolic dysfunction (impaired relaxation). Elevated left ventricular end-diastolic pressure. 2. Right ventricular systolic function is moderately reduced. The right ventricular size is normal. There is normal pulmonary artery systolic pressure. The estimated right ventricular systolic pressure is 22.7 mmHg. 3. The mitral valve has been previously repaired. The anterior mitral valve demonstrates thickening and a hockeystick appearance suggestive of a possible rheumatic valve. The mitral valve has been repaired/replaced. Trivial mitral valve regurgitation. Mild mitral stenosis. The mean mitral valve gradient is 5.0 mmHg with average heart rate of 60 bpm. There is a present in the mitral position. 4. The aortic valve is tricuspid. Aortic valve regurgitation is not visualized. 5. Mildly dilated pulmonary artery. 6. The inferior vena cava is normal in size with greater than 50% respiratory variability, suggesting right atrial pressure of 3 mmHg.   Physical Exam VS:  BP 118/70   Pulse (!) 59   Ht 5' 11 (1.803 m)   Wt 148 lb 6.4 oz (67.3 kg)   SpO2 97%   BMI 20.70 kg/m          Wt  Readings from Last 3 Encounters:  11/19/23 148 lb 6.4 oz (67.3 kg)  10/30/23 146 lb 3.2 oz (66.3 kg)  05/09/23 148 lb 6.4 oz (67.3 kg)    GEN: Well nourished, well developed in no acute distress CARDIAC: RRR, no murmurs, rubs, gallops RESPIRATORY:  Clear to auscultation without rales,  wheezing or rhonchi  EXTREMITIES:  No edema; No deformity  SKIN: LEFT infraclavicular incision without erythema or swelling    Device Information PPM: MDT Adapta, SN [id number], DOI 04/01/09 RA: MDT 4923, [id number], DOI 11/22/01 RV: SJM Pacesetter 1346T, SN [id number], DOI 11/28/96   Device interrogation AAIR-DDDR 60/130 PAV 150, SAV 120  ASVS 18.8% ASVP < 0.1% APVS 80.9% APVP 0.2% RA: 2.8 mV / 394 ohms / 1.0 V @ 0.4 ms  RV: 8 mV / 644 ohms / 1.25 V @ 0.4 ms    ASSESSMENT AND PLAN Amy Lane is a 73 y.o. female with symptomatic sinus bradycardia, Ehlers-Danlos syndrome, MVP s/p MVR at [facility], prior TIA on warfarin, prior syncope thought to be 2/2 LQTS, dysautonomia/POTS who presents for device f/u.    Symptomatic sinus bradycardia LEFT MDT DC PPM  Dysautonomia/POTS MVP s/p MVR Device function within normal limits. Longevity < 1 mo, now on monthly monitoring. Minimal VP however with significant 1' AVB although completely asx. No significant sx recently. Continue monitoring for generator change.    Dispo: RTC 6 mo

## 2024-01-09 NOTE — Brief Op Note (Signed)
° °  Procedure:  LEFT sided MDT DC-PM generator change  Pre-op Diagnosis: SND    Post-op Diagnosis: same  Procedure Date: 01/09/2024  Attending: Adina Primus, MD   Anesthesia: monitored anesthesia care  Procedure Report:  The LEFT shoulder was prepped with chlorhexidine  scrub. 30 cc lidocaine  was injected at the planned incision site. The subcutaneous tissue was dissected with electrocautery and blunt dissection. The pocket overlying the device was dissected to allow for removal of the device. The leads were then removed from the implanted device. There was significant eggsheall calcification surrounding the device. The anterior portion of this was removed with cautery and traction.   The new device was then connected to the existing leads. The pocket was irrigated with gentamicin  solution. A TYRX pouch was used.  The pocket was closed with continuous 2-0 V-loc and 3-0 Stratafix. Dermabond was used to close the superficial layer.    The device was evaluated by the representative of the cardiac device manufacturer under the supervision of the attending physician with implant parameters listed below.   Complications: none Estimated blood loss: less than 50 mL  Implanted Hardware: Implant Name Type Inv. Item Serial No. Manufacturer Lot No. LRB No. Used Action  POUCH AIGIS-R ANTIBACT PPM MED - [id number] Mesh General POUCH AIGIS-R ANTIBACT PPM MED  [employer] [id number] N/A 1 Implanted  IPG PACE AZUR XT DR MRI T8IM98 - [id number] Pacemaker IPG PACE AZUR XT DR MRI T8IM98 [id number] G [employer]  N/A 1 Implanted  MERM [id number] ADAPTA [id number] H   [id number] H [employer]   1 Explanted   Device Information: PPM: MDT Azure XT DR MRI SureScan [id number], SN [id number], DOI 01/09/24 RA: MDT 5076 52 cm, SN [id number], DOI 11/22/01 RV: Abbott/SJM 1346T, SN [id number], DOI 11/28/96  Lead Interrogation Data: RA: 1.3 mV / 323 ohms / 0.75 V @ 0.4 ms RV: 3.9 mV /  532 ohms / 1.5 V @ 0.4 ms  Summary: 1. Successful LEFT MDT DC PPM generator change   Recommendations: Routine post-procedure care with bedrest for 1 hours No heparin  (IV or subcutaneous) for 48 hours. No enoxaparin (IV or subcutaneous) for 7 days.  Resume apixaban  5 mg bid 12/18 AM (prior CVA hx off OAC)  Discharge after post-procedure bedrest.  Donnice DELENA Primus, MD [facility]  Cardiac Electrophysiology

## 2024-01-10 ENCOUNTER — Encounter (HOSPITAL_COMMUNITY): Payer: Self-pay | Admitting: Student in an Organized Health Care Education/Training Program

## 2024-01-12 ENCOUNTER — Other Ambulatory Visit: Payer: Self-pay | Admitting: Cardiovascular Disease

## 2024-01-12 DIAGNOSIS — I480 Paroxysmal atrial fibrillation: Secondary | ICD-10-CM

## 2024-01-13 ENCOUNTER — Ambulatory Visit

## 2024-01-14 NOTE — Telephone Encounter (Signed)
 Prescription refill request for Eliquis  received. Indication: A-Fib Last office visit: 05/09/23 Scr: 0.78 12/26/23 Care Everywhere Age: 73 Weight: 65.8 KG

## 2024-01-23 ENCOUNTER — Ambulatory Visit: Admitting: *Deleted

## 2024-01-23 ENCOUNTER — Other Ambulatory Visit: Payer: Self-pay | Admitting: Internal Medicine

## 2024-01-23 DIAGNOSIS — I495 Sick sinus syndrome: Secondary | ICD-10-CM | POA: Insufficient documentation

## 2024-01-23 DIAGNOSIS — Z950 Presence of cardiac pacemaker: Secondary | ICD-10-CM | POA: Insufficient documentation

## 2024-01-23 DIAGNOSIS — I480 Paroxysmal atrial fibrillation: Secondary | ICD-10-CM | POA: Diagnosis present

## 2024-01-23 DIAGNOSIS — F411 Generalized anxiety disorder: Secondary | ICD-10-CM

## 2024-01-23 NOTE — Patient Instructions (Signed)
   After Your Pacemaker   Monitor your pacemaker site for redness, swelling, and drainage. Call the device clinic at [phone] if you experience these symptoms or fever/chills.  Your incision was closed with Dermabond:  You may shower 1 day after your defibrillator implant and wash your incision with soap and water. Avoid lotions, ointments, or perfumes over your incision until it is well-healed.  You may use a hot tub or a pool after your wound check appointment if the incision is completely closed.  You may drive, unless driving has been restricted by your healthcare providers.  Remote monitoring is used to monitor your pacemaker from home. This monitoring is scheduled every 91 days by our office. It allows us to keep an eye on the functioning of your device to ensure it is working properly. You will routinely see your Electrophysiologist annually (more often if necessary).  

## 2024-01-24 NOTE — Progress Notes (Signed)
 Normal dual chamber pacemaker wound check. Presenting rhythm: AS-AP/VS. Wound well healed. Routine testing performed. Thresholds, sensing, and impedance consistent with implant measurements and at 3.5V safety margin/auto capture until 3 month visit. No episodes. No arm restrictions as this was a generator change out only.  Pt enrolled in remote follow-up. Battery longevity 11.8 years.

## 2024-01-28 LAB — CUP PACEART INCLINIC DEVICE CHECK
Date Time Interrogation Session: 20251231180923
Implantable Lead Connection Status: 753985
Implantable Lead Connection Status: 753985
Implantable Lead Implant Date: 19981106
Implantable Lead Implant Date: 20031031
Implantable Lead Location: 753859
Implantable Lead Location: 753860
Implantable Lead Model: 5076
Implantable Pulse Generator Implant Date: 20251217

## 2024-01-29 ENCOUNTER — Ambulatory Visit: Payer: Self-pay | Admitting: Student in an Organized Health Care Education/Training Program

## 2024-02-03 ENCOUNTER — Encounter: Payer: Self-pay | Admitting: Internal Medicine

## 2024-02-11 ENCOUNTER — Ambulatory Visit: Payer: Medicare Other

## 2024-02-13 ENCOUNTER — Ambulatory Visit

## 2024-02-20 ENCOUNTER — Ambulatory Visit: Attending: Student in an Organized Health Care Education/Training Program

## 2024-02-20 DIAGNOSIS — I495 Sick sinus syndrome: Secondary | ICD-10-CM

## 2024-02-20 LAB — CUP PACEART REMOTE DEVICE CHECK
Battery Remaining Longevity: 141 mo
Battery Voltage: 3.2 V
Brady Statistic AP VP Percent: 0.45 %
Brady Statistic AP VS Percent: 85.35 %
Brady Statistic AS VP Percent: 0.07 %
Brady Statistic AS VS Percent: 14.13 %
Brady Statistic RA Percent Paced: 84.63 %
Brady Statistic RV Percent Paced: 0.52 %
Date Time Interrogation Session: 20260127224404
Implantable Lead Connection Status: 753985
Implantable Lead Connection Status: 753985
Implantable Lead Implant Date: 19981106
Implantable Lead Implant Date: 20031031
Implantable Lead Location: 753859
Implantable Lead Location: 753860
Implantable Lead Model: 5076
Implantable Pulse Generator Implant Date: 20251217
Lead Channel Impedance Value: 266 Ohm
Lead Channel Impedance Value: 304 Ohm
Lead Channel Impedance Value: 456 Ohm
Lead Channel Impedance Value: 570 Ohm
Lead Channel Pacing Threshold Amplitude: 1 V
Lead Channel Pacing Threshold Amplitude: 1.125 V
Lead Channel Pacing Threshold Pulse Width: 0.4 ms
Lead Channel Pacing Threshold Pulse Width: 0.4 ms
Lead Channel Sensing Intrinsic Amplitude: 1.125 mV
Lead Channel Sensing Intrinsic Amplitude: 1.125 mV
Lead Channel Sensing Intrinsic Amplitude: 3.375 mV
Lead Channel Sensing Intrinsic Amplitude: 3.375 mV
Lead Channel Setting Pacing Amplitude: 2.25 V
Lead Channel Setting Pacing Amplitude: 2.25 V
Lead Channel Setting Pacing Pulse Width: 0.4 ms
Lead Channel Setting Sensing Sensitivity: 0.9 mV
Zone Setting Status: 755011
Zone Setting Status: 755011

## 2024-02-23 ENCOUNTER — Ambulatory Visit: Payer: Self-pay | Admitting: Student in an Organized Health Care Education/Training Program

## 2024-02-28 NOTE — Progress Notes (Signed)
 Remote PPM Transmission

## 2024-03-03 ENCOUNTER — Ambulatory Visit: Payer: Medicare Other

## 2024-03-15 ENCOUNTER — Ambulatory Visit

## 2024-04-11 ENCOUNTER — Ambulatory Visit: Admitting: Cardiology

## 2024-04-15 ENCOUNTER — Ambulatory Visit

## 2024-04-29 ENCOUNTER — Ambulatory Visit: Admitting: Internal Medicine

## 2024-05-12 ENCOUNTER — Ambulatory Visit: Payer: Medicare Other

## 2024-05-16 ENCOUNTER — Ambulatory Visit

## 2024-05-21 ENCOUNTER — Ambulatory Visit

## 2024-08-11 ENCOUNTER — Ambulatory Visit: Payer: Medicare Other

## 2024-08-20 ENCOUNTER — Ambulatory Visit

## 2024-09-02 ENCOUNTER — Ambulatory Visit

## 2024-11-10 ENCOUNTER — Ambulatory Visit: Payer: Medicare Other

## 2024-11-19 ENCOUNTER — Ambulatory Visit

## 2025-02-09 ENCOUNTER — Ambulatory Visit: Payer: Medicare Other

## 2025-02-18 ENCOUNTER — Ambulatory Visit
